# Patient Record
Sex: Female | Born: 1940
Health system: Southern US, Community
[De-identification: ages and names within clinical notes are randomized; demographics above are authoritative.]

## PROBLEM LIST (undated history)

## (undated) DIAGNOSIS — F419 Anxiety disorder, unspecified: Secondary | ICD-10-CM

## (undated) DIAGNOSIS — E0590 Thyrotoxicosis, unspecified without thyrotoxic crisis or storm: Secondary | ICD-10-CM

## (undated) DIAGNOSIS — Z5189 Encounter for other specified aftercare: Secondary | ICD-10-CM

## (undated) DIAGNOSIS — Z87442 Personal history of urinary calculi: Secondary | ICD-10-CM

## (undated) DIAGNOSIS — I672 Cerebral atherosclerosis: Secondary | ICD-10-CM

## (undated) DIAGNOSIS — Z8489 Family history of other specified conditions: Secondary | ICD-10-CM

## (undated) DIAGNOSIS — I209 Angina pectoris, unspecified: Secondary | ICD-10-CM

## (undated) DIAGNOSIS — M1990 Unspecified osteoarthritis, unspecified site: Secondary | ICD-10-CM

## (undated) DIAGNOSIS — R011 Cardiac murmur, unspecified: Secondary | ICD-10-CM

## (undated) DIAGNOSIS — I5030 Unspecified diastolic (congestive) heart failure: Secondary | ICD-10-CM

## (undated) DIAGNOSIS — E785 Hyperlipidemia, unspecified: Secondary | ICD-10-CM

## (undated) DIAGNOSIS — E119 Type 2 diabetes mellitus without complications: Secondary | ICD-10-CM

## (undated) DIAGNOSIS — I773 Arterial fibromuscular dysplasia: Secondary | ICD-10-CM

## (undated) DIAGNOSIS — I10 Essential (primary) hypertension: Secondary | ICD-10-CM

## (undated) DIAGNOSIS — J449 Chronic obstructive pulmonary disease, unspecified: Secondary | ICD-10-CM

## (undated) DIAGNOSIS — F0390 Unspecified dementia without behavioral disturbance: Secondary | ICD-10-CM

## (undated) DIAGNOSIS — C439 Malignant melanoma of skin, unspecified: Secondary | ICD-10-CM

## (undated) DIAGNOSIS — E871 Hypo-osmolality and hyponatremia: Secondary | ICD-10-CM

## (undated) DIAGNOSIS — I2510 Atherosclerotic heart disease of native coronary artery without angina pectoris: Secondary | ICD-10-CM

## (undated) HISTORY — PX: TOOTH EXTRACTION: SUR596

## (undated) HISTORY — DX: Cerebral atherosclerosis: I67.2

## (undated) HISTORY — PX: APPENDECTOMY: SHX54

## (undated) HISTORY — PX: CATARACT EXTRACTION, BILATERAL: SHX1313

## (undated) HISTORY — DX: Thyrotoxicosis, unspecified without thyrotoxic crisis or storm: E05.90

## (undated) HISTORY — DX: Arterial fibromuscular dysplasia: I77.3

## (undated) HISTORY — DX: Cardiac murmur, unspecified: R01.1

## (undated) HISTORY — DX: Hyperlipidemia, unspecified: E78.5

## (undated) HISTORY — DX: Malignant melanoma of skin, unspecified: C43.9

## (undated) HISTORY — DX: Encounter for other specified aftercare: Z51.89

## (undated) HISTORY — DX: Chronic obstructive pulmonary disease, unspecified: J44.9

## (undated) HISTORY — DX: Unspecified osteoarthritis, unspecified site: M19.90

---

## 1968-06-14 HISTORY — PX: ABDOMINAL HYSTERECTOMY: SHX81

## 2001-06-14 DIAGNOSIS — I2510 Atherosclerotic heart disease of native coronary artery without angina pectoris: Secondary | ICD-10-CM

## 2001-06-14 HISTORY — DX: Atherosclerotic heart disease of native coronary artery without angina pectoris: I25.10

## 2001-08-21 ENCOUNTER — Inpatient Hospital Stay (HOSPITAL_COMMUNITY): Admission: EM | Admit: 2001-08-21 | Discharge: 2001-08-29 | Payer: Self-pay | Admitting: Emergency Medicine

## 2001-08-21 ENCOUNTER — Encounter: Payer: Self-pay | Admitting: Emergency Medicine

## 2001-08-22 ENCOUNTER — Encounter: Payer: Self-pay | Admitting: Cardiovascular Disease

## 2001-08-22 HISTORY — PX: CARDIAC CATHETERIZATION: SHX172

## 2001-08-23 ENCOUNTER — Encounter: Payer: Self-pay | Admitting: Cardiovascular Disease

## 2001-08-24 ENCOUNTER — Encounter: Payer: Self-pay | Admitting: Thoracic Surgery (Cardiothoracic Vascular Surgery)

## 2001-08-25 ENCOUNTER — Encounter: Payer: Self-pay | Admitting: Thoracic Surgery (Cardiothoracic Vascular Surgery)

## 2001-08-27 ENCOUNTER — Encounter: Payer: Self-pay | Admitting: Thoracic Surgery (Cardiothoracic Vascular Surgery)

## 2001-09-20 ENCOUNTER — Encounter: Payer: Self-pay | Admitting: Thoracic Surgery (Cardiothoracic Vascular Surgery)

## 2001-09-20 ENCOUNTER — Encounter
Admission: RE | Admit: 2001-09-20 | Discharge: 2001-09-20 | Payer: Self-pay | Admitting: Thoracic Surgery (Cardiothoracic Vascular Surgery)

## 2008-06-14 DIAGNOSIS — C439 Malignant melanoma of skin, unspecified: Secondary | ICD-10-CM

## 2008-06-14 HISTORY — DX: Malignant melanoma of skin, unspecified: C43.9

## 2010-06-12 ENCOUNTER — Emergency Department (HOSPITAL_COMMUNITY)
Admission: EM | Admit: 2010-06-12 | Discharge: 2010-06-12 | Payer: Self-pay | Source: Home / Self Care | Admitting: Emergency Medicine

## 2010-07-20 HISTORY — PX: NM MYOCAR PERF WALL MOTION: HXRAD629

## 2010-08-11 ENCOUNTER — Other Ambulatory Visit: Payer: Self-pay | Admitting: Endocrinology

## 2010-08-11 DIAGNOSIS — E049 Nontoxic goiter, unspecified: Secondary | ICD-10-CM

## 2010-08-17 ENCOUNTER — Ambulatory Visit
Admission: RE | Admit: 2010-08-17 | Discharge: 2010-08-17 | Disposition: A | Discharge status: Home / Self Care | Payer: Self-pay | Source: Ambulatory Visit | Attending: Endocrinology | Admitting: Endocrinology

## 2010-08-17 DIAGNOSIS — E049 Nontoxic goiter, unspecified: Secondary | ICD-10-CM

## 2010-08-25 ENCOUNTER — Other Ambulatory Visit: Payer: Self-pay | Admitting: Endocrinology

## 2010-08-25 DIAGNOSIS — E042 Nontoxic multinodular goiter: Secondary | ICD-10-CM

## 2010-09-08 ENCOUNTER — Other Ambulatory Visit (HOSPITAL_COMMUNITY)
Admission: RE | Admit: 2010-09-08 | Discharge: 2010-09-08 | Disposition: A | Discharge status: Home / Self Care | Payer: Medicare Other | Source: Ambulatory Visit | Attending: Interventional Radiology | Admitting: Interventional Radiology

## 2010-09-08 ENCOUNTER — Other Ambulatory Visit: Payer: Self-pay | Admitting: Interventional Radiology

## 2010-09-08 ENCOUNTER — Ambulatory Visit
Admission: RE | Admit: 2010-09-08 | Discharge: 2010-09-08 | Disposition: A | Discharge status: Home / Self Care | Payer: Medicare Other | Source: Ambulatory Visit | Attending: Endocrinology | Admitting: Endocrinology

## 2010-09-08 DIAGNOSIS — E042 Nontoxic multinodular goiter: Secondary | ICD-10-CM

## 2010-10-30 NOTE — Consult Note (Signed)
[place]. [facility]  Patient:    Meredith Gutierrez, Meredith Gutierrez Visit Number: [id number] MRN: [id number]          Service Type: MED Location: [phone] Attending Physician:  Charlett Lango Dictated by:   Salvatore Decent. Dorris Fetch, M.D. Proc. Date: 08/22/01 Admit Date:  08/21/2001   CC:         [name], M.D.  Richard A. Alanda Amass, M.D.   Consultation Report  REASON FOR CONSULTATION:  Coronary aneurysm.  CHIEF COMPLAINT:  Chest pain.  HISTORY OF PRESENT ILLNESS:  The patient is a 70 year old female with a previous history of chest pain in 1994. She had cardiac catheterization done at that time and apparently was told it was normal. She subsequently had done well and had not had any additional chest pain until approximately 10 days ago. Starting then and for the next week, she had intermittent chest pain across the upper chest. It was a pressure as well as some sensation of sharp pain as well. It was associated with palpitations, shortness of breath, and diaphoresis. It also radiated to her neck and throat. There was no nausea or vomiting. She said that it could happen anytime, whether at rest or with exertion. She saw Dr. Susann Givens who did an EKG and other lab work, and scheduled her for a stress test on this coming Thursday. Her symptoms then resolved and had gone away until yesterday when she had a severe episode which lasted several hours. This was a more intense pain also associated with tachycardia and palpitations, diaphoresis, shortness of breath, and radiation to her neck. She tried nitroglycerin a couple of times without relief and came to the emergency room. She was given aspirin and oxygen on arrival and eventually her symptoms resolved. Her EKG showed sinus rhythm, poor R-wave progression across the precordium but no acute ST or T wave changes. Cardiac enzymes were unremarkable with a CK of 91, MB of 3.6, and a troponin of 0.04. The  patient today underwent cardiac catheterization which revealed no significant coronary stenoses, but she does have a saccular aneurysm on the LAD just beyond the takeoff of the first diagonal branch. This saccular aneurysm is approximately the diameter of the native LAD with a relatively thin neck. The origin of the diagonal appears to be just proximal to the aneurysm, but the aneurysm may actually involve the takeoff of the diagonal. She was also found to have fibromuscular dysplasia of the right renal artery. Her left ventricular function was normal.  PAST MEDICAL HISTORY:  Significant for hypertension, hypercholesterolemia. She has had an appendectomy, two D&Cs and three previous C-sections, as well as a hysterectomy.  MEDICATION ON ADMISSION: 1. Lotensin 5 mg p.o. q.d. 2. Baby aspirin one p.o. q.d.  ALLERGIES:  CODEINE, PREDNISONE, ERYTHROMYCIN, and PENICILLIN.  FAMILY HISTORY:  Father died at age 68 of a cardiac arrest after colon surgery. He also had a history of an MI. Her mother is 15 and has had two strokes. She has a brother with hypertension, and as sister also with hypertension.  SOCIAL HISTORY:  She is retired, widowed. She actually works part time in [employer]. She is a smoker for 20 years. She smokes one-half pack a day. She does not use alcohol.  REVIEW OF SYSTEMS:  She has not noted any fatigue or weakness. She has not had any weight loss. She does wear glasses. She has not noted any stroke or TIA-like symptoms. She has not had any wheezing, cough, or sputum production.  There is no claudication, no history of DVT or PE. She has had vein stripping from her right leg, recurrent varicose veins in the right leg. She has had some dizziness with her chest pain episodes. She does state that she bruises easily and is a "free bleeder," all other systems are negative.  PHYSICAL EXAMINATION:  GENERAL:  The patient is a 70 year old white female in no acute distress.  VITAL  SIGNS:  Blood pressure 130/75, pulse is 78 and regular, respirations are 20, oxygen saturation is 98% on 2 liters nasal cannula.  NEUROLOGICAL:  She is alert and oriented x3 and grossly intact.  HEENT:  Unremarkable. She is wearing glasses.  NECK:  Supple without thyromegaly, adenopathy, or bruits.  CARDIAC:  Regular rate and rhythm. Normal S1 and S2. No murmurs, rubs, or gallops.  LUNGS:  Clear to auscultation and percussion.  ABDOMEN:  Soft and nontender with positive bowel sounds.  EXTREMITIES:  She has scars at the left ankle from a saphenous vein cutdown. She had saphenous venectomy on the right side with multiple scars and some recurrent varicosities medially in the right leg. Her pulses are 2+ and equal bilaterally.  LABORATORY DATA:  On admission PT was 13.2, PTT 29, troponin 0.04. White count 10.8, hematocrit 39, platelets 306. Her sodium and potassium were 137 and 3.3, BUN and creatinine 13 and 0.7, glucose 149.  Chest x-ray showed some basilar atelectasis.  EKG: No acute T-wave changes.  IMPRESSION:  The patient is a 70 year old female. She presents with symptoms consistent with atypical unstable angina. She ruled out for a myocardial infarction. At cardiac catheterization she had no hemodynamically significant coronary disease; however, she did have a saccular aneurysm at the left anterior descending just beyond the takeoff of the first diagonal. This saccular aneurysm is very concerning. It is of a relatively large size, being approximately the same diameter as the left anterior descending itself. It does involve the takeoff of this diagonal branch. It is unclear really whether this had anything to do with her chest pain, or whether her chest pain and shortness of breath might be related to a tachyarrhythmia as she did have palpitations and her heart raced during these episodes. She also has fibromuscular dysplasia of the right renal artery. I think, given  the  appearance of this aneurysm, it appears to be a significant risk for rupture or thrombosis given its saccular nature, and my recommendation would be to ligate this aneurysm with bypass grafting of the left anterior descending and diagonal. This could potentially be done off pump, saving the more potential morbidity of cardiopulmonary bypass; however, that is not a certainty given the location. I do not believe there is any way to ligate the aneurysm without potentially compromising the diagonal, so I do think that would need to be grafted. I discussed with her and her family my opinion that this should be done. They understand the risks of the surgery including but not limited to death 1-2%, stroke, myocardial infarction, bleeding, possible need for blood transfusions, infections, deep vein thrombosis, pulmonary embolism, as well as other organ system dysfunction, including respiratory, renal, hepatic, or gastrointestinal. She understands that the symptomatology that she presented with cannot be directly tied to this aneurysm, but it is at least one potential source of difficulty, but the main reason for doing the surgery is to eliminate the future risks of thrombosis or rupture. She wished to think these matters over. If she decides to proceed with surgery, we could do  the procedure on Thursday, March 11th. I will meet with her again tomorrow to see if she has reached a decision. Dictated by:   [name] Fetch, M.D. Attending Physician:  Charlett Lango DD:  08/22/01 TD:  08/23/01 Job: [id number]

## 2010-10-30 NOTE — H&P (Signed)
[place]. [facility]  Patient:    Meredith Gutierrez, Meredith Gutierrez Visit Number: [id number] MRN: [id number]          Service Type: MED Location: [phone] Attending Physician:  Ruta Hinds Dictated by:   Moshe Salisbury, R.N.,A.C.N.P. Admit Date:  08/21/2001                           History and Physical  DATE OF BIRTH: [birth year]  CHIEF COMPLAINT: Chest pain.  HISTORY OF PRESENT ILLNESS: This is a 70 year old white female, a patient of Dr. Susann Givens, whose chest pain started at home about 1 p.m. and lasted to 4:30 p.m.  She took two nitroglycerin without relief.  The pain was across her upper chest, radiating to the middle of her throat.  She did not have nausea but she did have tachycardia, a cold sweat, shortness of breath, and the chest pain was severe.  It felt like a heaviness or a tightness.  Nothing made it better or worse.  Nothing seems to have brought it on.  The patient did have chest pain in 1994 and was hospitalized at [facility] at that time. She had a cardiac catheterization which she says she was told was negative. She cannot remember if this pain is similar to her present pain.  The pain began week before last as a tightness or heaviness across her upper chest, also with sweating and shortness of breath.  It lasted about five to 15 minutes and occurred multiple times each day.  She saw Dr. Susann Givens, who did an EKG and other laboratory work, and a stress test was set up with [facility] for a Cardiolite stress test on this coming Thursday. The chest pain lasted about seven days, and the patient was not having symptoms until yesterday.  On Sunday she had the same symptoms, which lasted about a minute, and occurred twice during the day.  These symptoms have not awakened the patient at night.  Upon arrival at the hospital she was given aspirin 325 mg and O2, and her heart rate was as high as 128.  She is  now pain-free.  PAST MEDICAL HISTORY:  1. Hypertension.  2. Hypercholesterolemia.  3. Obesity.  4. Smoking.  5. Cardiac catheterization in 1998 at [facility] the patient says     showed nothing; that is, no disease.  PAST SURGICAL HISTORY:  1. Cesarean section x3.  2. Two D&Cs.  3. Appendectomy.  4. Hysterectomy.  FRACTURES: None.  MEDICATION ALLERGIES:  1. CODEINE.  2. PREDNISONE.  3. MYCIN ANTIBIOTICS.  4. PENICILLIN.  MEDICATIONS:  1. Nitroglycerin 0.4 mg one sublingual every five minutes as needed for     chest pain.  2. Lotensin 5 mg one q.d.  3. Baby aspirin q.d.  4. Multivitamin q.d.  5. She says she takes potassium supplement 99 mg two q.d.  FAMILY HISTORY: The patients father died at 51 from cardiac arrest after colon surgery.  He had a history of myocardial infarction.  Her mother is 36 and has had cerebrovascular accidents x2, as well as numerous transient ischemic attacks.  She has one brother who has hypertension and one sister who has fibromyalgia and hypertension.  She has a daughter and a son, who are alive and well, and one grandchild.  SOCIAL HISTORY: The patient is a widow, and retired.  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  She does work part-time now for [employer], Personnel officer at [employer].  She is a 1/2 pack per day smoker. Does not use alcohol.  Does use caffeine.  Does not abuse drugs.  She is right-handed.  REVIEW OF SYSTEMS:  GENERAL: The patient denies fevers, chills.  She states that her weight is stable, and she sleeps only fair.  She denies edema.  EYES: The patient denies blurring, glaucoma, or cataracts.  She does wear glasses. EARS/NOSE/MOUTH/THROAT: The patient has a full set of upper and lower dentures.  She denies deafness or tinnitus.  She does have some rhinorrhea. She denies sneezing, and food has not lost its taste for her.  RESPIRATORY: The patient states that she has shortness of  breath on exertion.  She denies coughing, sputum production, wheezing, but she does smoke 1/2 pack of cigarettes per day.  CARDIAC: The patient does have chest pain, as noted above.  She also has experienced tachycardia with her chest pain.  She has some orthopnea.  VASCULAR: The patient denies claudication.  GASTROINTESTINAL: The patient denies nausea and indigestion, and she has regular bowel movements.  GENITOURINARY: The patient denies dysuria, pyuria, hematuria, or anuria.  She has nocturia x1.  She is post hysterectomy.  ENDOCRINE: The patient denies diabetes mellitus or hypothyroidism.  SKIN: The patient denies rashes or other skin problems.  MUSCULOSKELETAL: The patient states that she does have right knee pain but she denies myalgias or weakness.  She says that she has a steady gait.  PSYCHIATRIC: The patient denies depression and hallucinations.  Her appetite is normal, but she does have some anxiety. NEUROLOGIC: The patient has dizziness with her chest pain.  She denies faintness, seizures, or tremors.  ADENOPATHY: The patient denies adenopathy of the neck, axilla, or groin.  HEMATOLOGIC: The patient states that she both bruises and bleeds easily.  ALLERGIC: The patient is allergic to PENICILLIN, PREDNISONE, CODEINE, and MYCIN ANTIBIOTICS.  She denies any other allergies.  PHYSICAL EXAMINATION:  VITAL SIGNS: The patients blood pressure is 130/74, her pulse is 79, her respirations are 18.  She is on O2.  Her height is 5 feet 2 inches.  Her weight is unknown at this time.  Her temperature is 97.3 degrees Fahrenheit.  GENERAL: The patient is a well-developed, well-nourished white female, in no acute distress.  PSYCHIATRIC: The patient is pleasant, cooperative, and responds appropriately.  HEENT: The patients wears glasses.  Her pupils are equal, round, and reactive to light, and 2 mm in diameter.  Normocephalic, atraumatic.  Her mouth is moist.  Her oropharynx is benign.  She  is wearing a full set of upper and lower dentures.  NECK: Supple, with a midline trachea.  She was without jugular venous  distention, bruits, thyromegaly, lymphadenopathy, or hepatojugular reflux.  CHEST: The patients chest is clear to auscultation and percussion.  She is eupneic.  CARDIAC: The patient has a regular rate and rhythm.  S1 and S2 are clearly heard.  She is without murmurs, gallops, rubs, or clicks.  ABDOMEN: The patient has positive bowel sounds.  Her abdomen is nontender and soft.  She is not distended, but she is obese.  BREAST: The patients breast are of normal contour, without discharge or tenderness.  EXTREMITIES: The patient moves all extremities x4.  Her strength is 5/5 in her upper and lower extremities.  She is without ankle edema.  SKIN: The patients skin is warm and dry without jaundice, cyanosis, pallor, or rashes.  She has  brisk capillary refill.  NEUROLOGIC: The patient is conscious, alert and oriented to person, place, time, and situation.  Cranial nerves II-XII are grossly intact.  LABORATORY DATA: The patients PTT is 29, her PT is 13.2, her INR is 1.0.  Her troponin I is 0.04.  Her CK is 91, her MB is 3.6.  The index is not calculated.  Her WBC is high at 10.8, her hemoglobin is 13.9, her hematocrit is 39.6, her platelets are 306,000.  Her sodium is 137, her potassium is mildly low at 3.3, her chloride is 104, her bicarbonate is 21, her BUN is 13, her creatinine is 0.7, her blood sugar is 149.  Her chest x-ray showed atelectasis in her right base.  Her EKG shows normal sinus rhythm with ST-T wave abnormalities.  IMPRESSION:  1. Chest pain.  2. Shortness of breath.  3. Weakness.  4. Hypertension.  5. Hypercholesterolemia.  6. Active smoking.  7. Obesity.  PLAN:  1. Admit to telemetry.  2. Serial enzymes and EKGs.  3. O2 by nasal cannula.  4. Anticoagulation.  5. Nitroglycerin drip.  6. Cardiac catheterization, possible angioplasty in  the a.m.  She has been     added to the add-on board.Dictated by:   Moshe Salisbury, R.N.,A.C.N.P.  Attending Physician:  Ruta Hinds DD:  08/21/01 TD:  08/22/01 Job: [id number]

## 2010-10-30 NOTE — Discharge Summary (Signed)
[place]. [facility]  Patient:    ZIANA, HEYLIGER Visit Number: [id number] MRN: [id number]          Service Type: MED Location: 2000 2040 01 Attending Physician:  Charlett Lango Dictated by:   Adair Patter, P.A. Admit Date:  08/21/2001 Discharge Date: 08/29/2001   CC:         [facility]   Discharge Summary  ADMITTING DIAGNOSIS:  Chest pain.  SECONDARY DIAGNOSIS:  Coronary aneurysm.  DISCHARGE DIAGNOSIS:  Coronary aneurysm.  HOSPITAL COURSE: Ms. Hosie was admitted to [facility] on August 22, 2001, secondary to chest pain.  Because of this chest pain, she underwent cardiac work-up including cardiac catheterization.  Results of the catheterization revealed the patient had a coronary aneurysm of her left anterior descending artery.  Because of this, Dr. Dorris Fetch was consulted and recommended surgical intervention.  On August 24, 2001, the patient underwent a coronary artery bypass graft x2 off pump of the left internal mammary artery and anastomosis to the left anterior descending artery and a saphenous vein graft to the diagonal artery.  At this time, she also underwent ligation of the left anterior descending artery aneurysm.  No complications were noted during the procedure.  Postoperatively, the patient had a relatively uneventful hospital course.  She had some nausea and vomiting which was treated with adjustment of pain medications.  Hemoglobin and hematocrit remained stable at 9.6 and 27.  The BUN and creatinine were 7 and 0.6, respectively.  The patient did have intermittent shortness of breath.  This was treated with nebulizer treatments and oxygen as needed.  She was subsequently deemed stable for discharge on August 29, 2001.  DISCHARGE MEDICATIONS: 1. Ultram 50 mg one to two tablets every four to six hours as needed for pain. 2. Aspirin 325 mg one daily. 3. Toprol XL 25 mg one daily. 4.  Wellbutrin 150 mg one daily. 5. Zocor 40 mg one daily. 6. Combivent inhaler three puffs four times daily.  ACTIVITY:  The patient is told to avoid driving, strenuous activity or lifting heavy objects.  The patient is told to walk daily and continue breathing exercises.  DISCHARGE DIET:  Without salt.  WOUND CARE:  The patient is told she can shower and clean her incisions with soap and water.  FOLLOW-UP:  The patient is told to call her cardiologist, Dr. Alanda Amass, for an appointment in two weeks.  She is told to see Dr. Dorris Fetch on Wednesday, September 20, 2001, at 11:30 at the [facility] office.  She was told to go to the [facility] one hour before this appointment for a chest x-ray. Dictated by:   Adair Patter, P.A. Attending Physician:  Charlett Lango DD:  08/29/01 TD:  08/29/01 Job: [id number]

## 2010-10-30 NOTE — Cardiovascular Report (Signed)
[place]. [facility]  Patient:    Meredith Gutierrez, Meredith Gutierrez Visit Number: [id number] MRN: [id number]          Service Type: MED Location: [phone] Attending Physician:  Ruta Hinds Dictated by:   Pearletha Furl Alanda Amass, M.D. Proc. Date: 08/22/01 Admit Date:  08/21/2001   CC:         Meredith Gutierrez, M.D.  Meredith Gutierrez, M.D.  Meredith Gutierrez, M.D.   Cardiac Catheterization  PROCEDURES: 1. Retrograde central aortic catheterization. 2. Selective coronary angiography via Judkins technique. 3. Left ventricular angiogram, RAO/LAO projection. 4. Aortic root angiogram, LAO projection. 5. Subselective LIMA/RIMA. 6. Abdominal aortic angiogram, midstream PA projection. 7. Bilateral selective renal angiography.  PROCEDURAL NOTE:  The above procedure was done with #6 [place] diagnostic Cordis preformed coronary and pigtail catheters using Omnipaque dye.  The procedure was done through a #6 [place] short Daig sidearm sheath that was placed in the CRFA with a single anterior puncture using the modified Seldinger technique under 1% Xylocaine anesthesia and 2 mg of Nubain for sedation.  Coronary angiography was done in multiple views followed by LV angiography in the RAO and LAO projection at 25 cc/14 cc per second and 20 cc/12 cc per second.  Pullback pressures of CA showed no gradient across the aortic valve.  Subselective LIMA/RIMA, brachiocephalic, and left subclavian were done with a right coronary catheter with hand injection. Abdominal aortic angiogram was done in the midstream PA projection at 25 cc/ 20 cc per second.  The catheter was inadvertently below the level of the renal arteries so this was repeated at a level above the renal arteries because of disease in these.  Selective renal angiography was done with a right coronary catheter by hand injection.  Re-injections of the left coronary in angled caudal LAO projection to visualize the ostia  were then repeated by hand injection.  The catheter was removed.  Sidearm sheath was flushed.  The patient had heparin held on call to the laboratory.  ACT was 130.  She was transferred to the holding area for sheath removal with pressure hemostasis. She tolerated the procedure well.  PRESSURES: 1. LV: 150/0; LVEDP 18 mmHg. 2. CA: 150/85 mmHg. 3. There is no gradient across the aortic valve on catheter pullback.  Fluoroscopy revealed minimal calcification of the aortic arch opposite of the arch into the great vessels.  There was no significant coronary or intracardiac calcification seen.  LEFT VENTRICULOGRAM:  LV angiogram revealed a vigorous left ventricle that was hyperdynamic.  In the post-PVC beat there was some near mid systolic cavity obliteration.  EF was greater than 60%.  There was no mitral regurgitation and angiographic LVH was present.  CORONARY ARTERIES: 1. The main left coronary artery was a large vessel.  It was normal length and    did not appear aneurysmal.  No significant obstruction.  2. The proximal LAD was a very large vessel that appeared abnormally enlarged    although not aneurysmal up to the junction of the proximal third beyond a    large first diagonal branch.  There was an eccentric single saccular    aneurysm just beyond the first diagonal branch not interfering with the    origin of the first diagonal.  There was normal flow to the LAD.  The LAD    in the mid portion had irregularities with 30-40% narrowing and coursed to    the apex and undersurface where it bifurcated.  There was TIMI-3  flow    throughout the LAD.  3. The first diagonal was moderately large, bifurcated, and had no significant    stenosis.  The second diagonal from the junction of the proximal mid third    of the LAD had no significant stenosis.  There were two septal perforators    arising proximal and distal to the first diagonal.  4. The circumflex ostia had eccentric 30-40%  narrowing with good flow.  There    was narrowing of a very small OM-1 ostium.  The remainder of the circumflex    appeared normal with a normal bifurcating OM-2, a nondominant moderate    sized bifurcating circumflex proper, and a moderate sized CABG and left    atrial branch.  5. The right coronary artery was a dominant vessel with irregularities and 20%    narrowing of the proximal third and 20% narrowing of the distal portion    before the bifurcation.  There were 3 PDA branches and a small PLA branch.    Normal flow throughout the right coronary artery.  6. The left subclavian appeared normal.  The left vertebral had antegrade flow    with minor tortuosity, and the LIMA appeared normal.  7. The right brachiocephalic appeared normal as did the proximal RCCA.  The    mid right subclavian at the origin of the vertebral and the RIMA was mildly    dilated compared to the remainder of the vessel.  There was good flow.  AORTIC ARCH:  Aortic arch injection in the LAO projection showed a dilated aortic root that did not appear aneurysmal.  There was normal origin of the great vessels and no dissection.  There was some calcification on the opposite wall from the origin of the left subclavian at that level.  ABDOMINAL AORTIC ANGIOGRAM:  Abdominal aortic angiogram in the midstream PA projection revealed a normal celiac and proximal SMA access.  The left renal artery appeared normal.  The right renal artery had significant FMD "beading-type" lesions with what appeared to be angiographically high-grade stenosis in the mid-distal third junction of the main right renal artery before the bifurcation to the anterior and posterior branches.  There was no FMD in the left renal artery.  The infrarenal abdominal aorta had mild atherosclerotic changes with no aneurysm formation or stenosis with normal proximal iliacs.   ASSESSMENT:  This 70 year old widow, white mother of 1, is a smoker  with hyperlipidemia.  She had normal coronary arteries in 1994 at [facility].  She is referred by Dr. Susann Givens for 2 weeks of very typical ischemic-sounding chest pain with substernal pressure with exertion and rest and radiation to the left arm.  She was to have an outpatient Cardiolite this week, however was admitted last night because of prolonged chest pain. Myocardial infarction was ruled out by serial enzymes and EKGs.  There is a history of hypertension on Lotensin and a history of GERD symptoms with symptoms of dysphagia but no GI bleeding.  Coronary angiography shows a large saccular LAD aneurysm just beyond the first diagonal branch.  This is well circumscribed and does not appear to be causing any flow restriction to the diagonal or LAD.  It is an approximate size of the LAD in this area and I would estimate it at about 4.5- to 5-mm in diameter. It is about 2 to 3 times the size of the diagonal which arises just above the neck of the aneurysm.  There was mild disease of the  ostium of the circumflex and no significant coronary stenosis.  The etiology of her chest pain is not clear.  It is similar to what she had in 1994 so it may be noncoronary.  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  She does have FMD (fibromuscular dysplasia) and systemic hypertension of the right renal artery.  This appears high-grade angiographically and she is a candidate for PTRA of this in an elective setting.  There is vigorous LV function and mild arch dilatation.  I will check serologies on this patient.  It may be useful to get a CT and/or MRI of her ascending aorta.  Her LAD appears abnormally large although not aneurysmal compared to the remainder of the vessel and there are no other great vessel aneurysms.  ______ systemic disease.  From an interventional standpoint this aneurysm could potentially be treated with  coiling and possibly stenting to exclude this and/or coverage stent but you would probably have to sacrifice the first diagonal.  Vascular surgical consultation is pending with further discussions.  Intensive GI therapy now and she may require GI consultation as well.  CATHETERIZATION DIAGNOSES: 1. Exertional chest pain compatible with ischemia.  No significant coronary    stenosis on angiography. 2. 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 3. Fibromuscular dysplasia, right renal artery, with high-grade stenosis    associated with systemic hypertension, left ventricular hypertrophy. 4. Chronic obstructive pulmonary disease, cigarette abuse. 5. Hyperlipidemia. Dictated by:   Pearletha Furl Alanda Amass, M.D. Attending Physician:  Ruta Hinds DD:  08/22/01 TD:  08/22/01 Job: [id number]

## 2010-10-30 NOTE — Op Note (Signed)
[place]. [facility]  Patient:    Meredith Gutierrez, Meredith Gutierrez Visit Number: [id number] MRN: [id number]          Service Type: MED Location: [phone] Attending Physician:  Meredith Gutierrez Dictated by:   Meredith Gutierrez, M.D. Proc. Date: 08/24/01 Admit Date:  08/21/2001   CC:         Meredith Gutierrez, M.D.   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  PROCEDURE:  Median sternotomy, off pump coronary artery bypass grafting x2 (left internal mammary artery to left anterior descending artery, saphenous vein graft to first diagonal), and ligation of left anterior descending artery aneurysm.  SURGEON:  Meredith Gutierrez, M.D.  ASSISTANT:  Areta Haber, P.A.  ANESTHESIA:  General.  FINDINGS:  Approximately 2 cm diameter saccular aneurysm of the left anterior descending artery arising at the take off of a large first diagonal branch. The first diagonal was tented over the aneurysm, and appeared to be compressed.  Good quality targets and good quality conduits.  Saphenous vein at the ankle on the left side was unusable.  INDICATIONS FOR PROCEDURE:  Meredith Gutierrez is a 70 year old female who presented with complaints consistent with angina pectoris.  She was evaluated by Dr. Susa Gutierrez, and underwent cardiac catheterization which revealed no significant coronary stenoses, but there was a large saccular aneurysm of the left anterior descending artery with possible impingement on the first diagonal.  Aneurysm arose just distal to the take off of the first diagonal. The patient was referred for consideration of treatment of the aneurysm and possible bypass grafting.  Discussed in detail with the patient the nature of her problem, the danger of growth, thrombosis, or rupture of the saccular aneurysm  of her left anterior descending artery, the uncertainty as to the etiology of her chest pain and the actual risk of fatal complication from the left anterior descending artery aneurysm.  Made the recommendation that we proceed with bypass grafting and ligation of the aneurysm.  It was felt that bypassing the left anterior descending artery was necessary because of the risk of narrowing the left anterior descending artery at the site of the aneurysm with closure.  The patient understood all issues involved, she accepted the risks of surgery, and agreed to proceed.  DESCRIPTION OF PROCEDURE:  Meredith Gutierrez was brought to the preoperative holding area on 08/24/01.  Lines were placed to monitor arterial central venous, and pulmonary arterial pressures.  EKG leads were placed for continuous telemetry.  The patient was taken to the operating room, anesthetized and intubated.  A Foley catheter was placed, intravenous antibiotics were administered.  The chest, abdomen, and legs were prepped and draped in the usual fashion.  A median sternotomy was performed.  The left internal mammary artery was harvested in a standard fashion.  It was a good quality conduit. Simultaneously, an incision was made in the medial aspect of the left ankle. The greater saphenous vein was identified and dissected over a short area, however, it became sclerotic and too small to use for grafting, therefore, a segment of vein was harvested from the thigh which was of good quality, although it was beginning to narrow distally, but was sufficient for use as a graft.  After harvesting the graft, the patient was given 7000 units of heparin intravenously.  ACTs were monitored, and additional heparin was given as needed  to maintain an ACT of greater than 300.  The pericardium was opened.  The ascending aorta was inspected and palpated. There was no palpable atherosclerotic disease.  Deep pericardial stay sutures were placed and  covered with Rumel tourniquets to prevent epicardial laceration.  Traction was placed on the deep pericardial sutures, and the apex of the heart was brought forward out of the chest.  The patient was placed in steep Trendelenburg position, and rotated to the right side, and tolerated changes in cardiac position well without any evidence of sustained hypotension or ST changes throughout the procedure.  The heart was inspected.  It was suitable for off pump grafting, as well as treatment of the aneurysm.  The aneurysm could be seen beneath the epicardial fat at the take off of the first diagonal.  The conduits were cut to length using a Guidant stabilizer.  The site was selected for grafting on the first diagonal.  This vessel was a 2 mm good quality target.  It was narrowed from traction over the aneurysm proximally.  The heart was stabilized, and stabilization was excellent.  An arteriotomy was made in the diagonal.  Lengthened proximally and distally.  A 2 mm shunt was placed with good hemostasis.  The saphenous vein graft was anastomosed end-to-side to the first diagonal with a running 7-0 Prolene suture.  Before placing the final suture, the shunt was removed.  There was good hemostasis at the anastomosis.  Next, the stabilizer was moved to the mid left anterior descending artery. This was also a 2 mm good quality target.  The mammary was a 3 mm good quality conduit.  Again, there was good stabilization.  An arteriotomy was made in the left anterior descending artery, lengthened proximally and distally.  A 2 mm shunt was placed with good hemostasis.  The mammary to left anterior descending artery anastomosis was performed end-to-side with a running 8-0 Prolene suture.  Again, before placing a final suture, the shunt was removed, and again there was good hemostasis of the anastomosis.  The anastomosis was de-aired prior to releasing the bulldog from the mammary artery.  The  mammary pedicle was tacked to the epicardial surface of the heart with 6-0 Prolene sutures.   Next, a partial occlusion clamp was placed on the ascending aorta.  This was done slowly, sequentially tightening the clamp with each diastole.  A 4.0 mm punch aortotomy was performed, and the proximal vein graft anastomosis was performed with this punch aortotomy with a running 6-0 Prolene suture.  With the patient still in steep Trendelenburg position, the partial clamp was removed before tying the suture to allow for de-airing.  Air was aspirated from the vein graft.  The bulldog clamp was removed allowing flow through that graft as well.  The stabilizer then was placed at the bifurcation of the left anterior descending artery and the first diagonal.  There was a 2 cm saccular aneurysm. The epicardial fat was dissected off the aneurysm anterior laterally.  It was not dissected posterior for fear of manipulation of the aneurysm.  The aneurysm did extend into the myocardium in that region.  Adequate exposure was obtained to allow suture placement.  A 4-0 Prolene horizontal mattress suture with Teflon felt pledgets was placed through the neck of the aneurysm.  A second 4-0 suture was placed as well.  Before tying the second suture, the aneurysm was incised.  There was bleeding.  When the second suture was tied, the bleeding completely stopped, indicating adequate  closure of the neck of the aneurysm.  The patient remained hemodynamically stable without significant ST changes throughout the entire procedure.  The deep pericardial sutures were removed. The heart was allowed to rest back in its normal location.  All proximal and distal anastomoses were inspected for hemostasis.  Epicardial pacing wires were placed on the right ventricle and right atrium.  Hemostasis was achieved. The chest was irrigated with 1 L of warm normal saline containing 1 g of vancomycin.  The heparin was not reversed.  A  left pleural and two mediastinal chest tubes were placed through separate subcostal incisions.  The pericardium was reapproximated with interrupted 3-0 silk sutures that came together easily without tension.  A left pleural and two mediastinal chest tubes were placed through separate subcostal incisions.  The sternum was closed with heavy gauge interrupted stainless steel wires.  The pectoralis fascia was closed with a running #1 Vicryl suture.  The subcutaneous tissue was closed with a running 2-0 Vicryl suture.  The skin was closed with a 3-0 Vicryl subcuticular suture. All sponge, needle, and instrument counts were correct at the end of the procedure.  The patient was taken from the operating room to the surgical intensive care unit intubated in critical, but stable condition. Dictated by:   Meredith Decent Dorris Gutierrez, M.D. Attending Physician:  Meredith Gutierrez DD:  08/24/01 TD:  08/26/01 Job: [id number]

## 2011-08-05 ENCOUNTER — Inpatient Hospital Stay (HOSPITAL_COMMUNITY)
Admission: EM | Admit: 2011-08-05 | Discharge: 2011-08-10 | DRG: 287 | Disposition: A | Discharge status: Home / Self Care | Payer: Medicare Other | Source: Ambulatory Visit | Attending: Internal Medicine | Admitting: Internal Medicine

## 2011-08-05 ENCOUNTER — Encounter (HOSPITAL_COMMUNITY): Payer: Self-pay | Admitting: *Deleted

## 2011-08-05 ENCOUNTER — Emergency Department (HOSPITAL_COMMUNITY): Payer: Medicare Other

## 2011-08-05 ENCOUNTER — Other Ambulatory Visit: Payer: Self-pay

## 2011-08-05 DIAGNOSIS — Z951 Presence of aortocoronary bypass graft: Secondary | ICD-10-CM | POA: Diagnosis present

## 2011-08-05 DIAGNOSIS — Z87891 Personal history of nicotine dependence: Secondary | ICD-10-CM | POA: Diagnosis present

## 2011-08-05 DIAGNOSIS — R001 Bradycardia, unspecified: Secondary | ICD-10-CM | POA: Diagnosis present

## 2011-08-05 DIAGNOSIS — I498 Other specified cardiac arrhythmias: Secondary | ICD-10-CM | POA: Diagnosis present

## 2011-08-05 DIAGNOSIS — F17200 Nicotine dependence, unspecified, uncomplicated: Secondary | ICD-10-CM | POA: Diagnosis present

## 2011-08-05 DIAGNOSIS — I509 Heart failure, unspecified: Secondary | ICD-10-CM | POA: Diagnosis present

## 2011-08-05 DIAGNOSIS — I10 Essential (primary) hypertension: Secondary | ICD-10-CM | POA: Diagnosis present

## 2011-08-05 DIAGNOSIS — I5030 Unspecified diastolic (congestive) heart failure: Principal | ICD-10-CM | POA: Diagnosis present

## 2011-08-05 DIAGNOSIS — E1169 Type 2 diabetes mellitus with other specified complication: Secondary | ICD-10-CM | POA: Diagnosis present

## 2011-08-05 DIAGNOSIS — E785 Hyperlipidemia, unspecified: Secondary | ICD-10-CM | POA: Diagnosis present

## 2011-08-05 DIAGNOSIS — I2510 Atherosclerotic heart disease of native coronary artery without angina pectoris: Secondary | ICD-10-CM | POA: Diagnosis present

## 2011-08-05 DIAGNOSIS — R21 Rash and other nonspecific skin eruption: Secondary | ICD-10-CM | POA: Diagnosis present

## 2011-08-05 DIAGNOSIS — I5032 Chronic diastolic (congestive) heart failure: Secondary | ICD-10-CM

## 2011-08-05 HISTORY — DX: Essential (primary) hypertension: I10

## 2011-08-05 HISTORY — DX: Unspecified diastolic (congestive) heart failure: I50.30

## 2011-08-05 HISTORY — DX: Anxiety disorder, unspecified: F41.9

## 2011-08-05 HISTORY — DX: Atherosclerotic heart disease of native coronary artery without angina pectoris: I25.10

## 2011-08-05 HISTORY — DX: Angina pectoris, unspecified: I20.9

## 2011-08-05 LAB — CBC
MCH: 32.1 pg (ref 26.0–34.0)
MCHC: 35.7 g/dL (ref 30.0–36.0)
MCV: 90 fL (ref 78.0–100.0)
RDW: 12.7 % (ref 11.5–15.5)

## 2011-08-05 LAB — BASIC METABOLIC PANEL
Calcium: 9.8 mg/dL (ref 8.4–10.5)
Creatinine, Ser: 0.43 mg/dL — ABNORMAL LOW (ref 0.50–1.10)
Glucose, Bld: 123 mg/dL — ABNORMAL HIGH (ref 70–99)
Potassium: 3.4 mEq/L — ABNORMAL LOW (ref 3.5–5.1)
Sodium: 141 mEq/L (ref 135–145)

## 2011-08-05 LAB — CARDIAC PANEL(CRET KIN+CKTOT+MB+TROPI)
Relative Index: INVALID (ref 0.0–2.5)
Total CK: 51 U/L (ref 7–177)
Troponin I: <0.3 ng/mL (ref <0.30)

## 2011-08-05 LAB — TROPONIN I: Troponin I: <0.3 ng/mL (ref <0.30)

## 2011-08-05 LAB — PRO B NATRIURETIC PEPTIDE: Pro B Natriuretic peptide (BNP): 967.1 pg/mL — ABNORMAL HIGH (ref 0–125)

## 2011-08-05 MED ORDER — NITROGLYCERIN 2 % TD OINT
1.0000 [in_us] | TOPICAL_OINTMENT | Freq: Four times a day (QID) | TRANSDERMAL | Status: DC
  Administered 2011-08-05: 1 [in_us] via TOPICAL
  Filled 2011-08-05: qty 1

## 2011-08-05 MED ORDER — NITROGLYCERIN 0.4 MG SL SUBL
0.4000 mg | SUBLINGUAL_TABLET | SUBLINGUAL | Status: DC | PRN
  Administered 2011-08-05: 0.4 mg via SUBLINGUAL
  Filled 2011-08-05: qty 25

## 2011-08-05 MED ORDER — ASPIRIN 325 MG PO TABS
325.0000 mg | ORAL_TABLET | ORAL | Status: AC
  Administered 2011-08-05: 325 mg via ORAL
  Filled 2011-08-05: qty 1

## 2011-08-05 NOTE — ED Notes (Signed)
Pt has had left chest pain and sob for 2-3 days.  This has been associated with sob and hypertension at home.

## 2011-08-05 NOTE — H&P (Addendum)
Meredith Gutierrez is an 71 y.o. female.   PCP - Dr.Kevin Little. Chief Complaint: Chest pain. HPI: 71 year-old female with history of hypertension CAD status post CABG in 2003 presented to the ER because of ongoing chest pain. Patient has been experiencing chest pain last 3 days. The chest pain is in the anterior chest wall feels like a pressure and last for a few seconds but is associated with exertion and shortness of breath. Denies any associated nausea vomiting diaphoresis or dizziness. Today when patient checked her blood pressure at home it was found to be very high and at the moment she called EMS. Cardiac enzymes and EKG does not show anything acute at this time. Chest x-ray shows mild congestion. Patient will be admitted for further management. Patient denies any abdominal pain fever chills cough or phlegm or any focal deficits.  Past Medical History  Diagnosis Date  . Hypertension   . Coronary artery disease     Past Surgical History  Procedure Date  . Coronary artery bypass graft   . Abdominal hysterectomy   . Appendectomy   . Cesarean section     Family History  Problem Relation Age of Onset  . Stroke Mother   . Coronary artery disease Father   . Breast cancer Maternal Aunt    Social History:  reports that she has been smoking Cigarettes.  She has been smoking about .5 packs per day. She does not have any smokeless tobacco history on file. She reports that she does not drink alcohol. Her drug history not on file.  Allergies:  Allergies  Allergen Reactions  . Codeine     Shortness of breath  . Penicillins     Shortness of breath  . Prednisone     Shortness of breath    Medications Prior to Admission  Medication Dose Route Frequency Provider Last Rate Last Dose  . aspirin tablet 325 mg  325 mg Oral STAT Glynn Octave, MD   325 mg at 08/05/11 2057  . nitroGLYCERIN (NITROGLYN) 2 % ointment 1 inch  1 inch Topical Q6H Glynn Octave, MD      . nitroGLYCERIN  (NITROSTAT) SL tablet 0.4 mg  0.4 mg Sublingual Q5 min PRN Glynn Octave, MD   0.4 mg at 08/05/11 2058   No current outpatient prescriptions on file as of 08/05/2011.    Results for orders placed during the hospital encounter of 08/05/11 (from the past 48 hour(s))  CBC     Status: Abnormal   Collection Time   08/05/11  6:58 PM      Component Value Range Comment   WBC 13.5 (*) 4.0 - 10.5 (K/uL)    RBC 4.11  3.87 - 5.11 (MIL/uL)    Hemoglobin 13.2  12.0 - 15.0 (g/dL)    HCT 96.0  45.4 - 09.8 (%)    MCV 90.0  78.0 - 100.0 (fL)    MCH 32.1  26.0 - 34.0 (pg)    MCHC 35.7  30.0 - 36.0 (g/dL)    RDW 11.9  14.7 - 82.9 (%)    Platelets 283  150 - 400 (K/uL)   BASIC METABOLIC PANEL     Status: Abnormal   Collection Time   08/05/11  6:58 PM      Component Value Range Comment   Sodium 141  135 - 145 (mEq/L)    Potassium 3.4 (*) 3.5 - 5.1 (mEq/L)    Chloride 104  96 - 112 (mEq/L)    CO2  26  19 - 32 (mEq/L)    Glucose, Bld 123 (*) 70 - 99 (mg/dL)    BUN 18  6 - 23 (mg/dL)    Creatinine, Ser 2.13 (*) 0.50 - 1.10 (mg/dL)    Calcium 9.8  8.4 - 10.5 (mg/dL)    GFR calc non Af Amer >90  >90 (mL/min)    GFR calc Af Amer >90  >90 (mL/min)   PRO B NATRIURETIC PEPTIDE     Status: Abnormal   Collection Time   08/05/11  6:58 PM      Component Value Range Comment   Pro B Natriuretic peptide (BNP) 967.1 (*) 0 - 125 (pg/mL)   TROPONIN I     Status: Normal   Collection Time   08/05/11  6:58 PM      Component Value Range Comment   Troponin I <0.30  <0.30 (ng/mL)   CARDIAC PANEL(CRET KIN+CKTOT+MB+TROPI)     Status: Normal   Collection Time   08/05/11  9:35 PM      Component Value Range Comment   Total CK 51  7 - 177 (U/L)    CK, MB 2.8  0.3 - 4.0 (ng/mL)    Troponin I <0.30  <0.30 (ng/mL)    Relative Index RELATIVE INDEX IS INVALID  0.0 - 2.5     Dg Chest 2 View  08/05/2011  *RADIOLOGY REPORT*  Clinical Data: Chest tightness.  Shortness of breath.  Smoker.  CHEST - 2 VIEW  Comparison: Report dated  09/20/2001.  Findings: Borderline enlarged cardiac silhouette.  Post CABG changes.  Clear lungs.  The lungs are mildly hyperexpanded with mild prominence of the pulmonary vasculature and interstitial markings.  Thoracic spine degenerative changes.  IMPRESSION:  1.  Borderline cardiomegaly and mild pulmonary vascular congestion. 2.  Mild changes of COPD.  Original Report Authenticated By: Darrol Angel, M.D.    Review of Systems  Constitutional: Negative.   HENT: Negative.   Eyes: Negative.   Respiratory: Positive for shortness of breath.   Cardiovascular: Positive for chest pain.  Gastrointestinal: Negative.   Genitourinary: Negative.   Musculoskeletal: Negative.   Skin: Negative.   Neurological: Negative.   Endo/Heme/Allergies: Negative.   Psychiatric/Behavioral: Negative.     Blood pressure 147/69, pulse 46, temperature 98.2 F (36.8 C), temperature source Oral, resp. rate 13, SpO2 99.00%. Physical Exam  Constitutional: She is oriented to person, place, and time. She appears well-developed and well-nourished. No distress.  HENT:  Head: Normocephalic and atraumatic.  Right Ear: External ear normal.  Left Ear: External ear normal.  Nose: Nose normal.  Mouth/Throat: Oropharynx is clear and moist. No oropharyngeal exudate.  Eyes: Conjunctivae are normal. Pupils are equal, round, and reactive to light. Right eye exhibits no discharge. Left eye exhibits no discharge. No scleral icterus.  Neck: Normal range of motion. Neck supple.  Cardiovascular:       Sinus bradycardia rate is around 50 per minute.  Respiratory: Effort normal and breath sounds normal. No respiratory distress. She has no wheezes. She has no rales.  GI: Soft. Bowel sounds are normal. She exhibits no distension. There is no tenderness. There is no rebound.  Musculoskeletal: Normal range of motion. She exhibits no edema and no tenderness.  Neurological: She is alert and oriented to person, place, and time. She has normal  reflexes.       Moves upper and lower extremities 5/5.   Skin: She is not diaphoretic.       Small rash in  the left hand from a scratch obtained from a dog's nail.     Assessment/Plan #1. Chest pain with history of CAD status post CABG - at this time for chest pain is concerning for ACS. Patient will be continued on aspirin and on nitroglycerin patch. Cycle cardiac markers. Check 2-D echo. Consult [facility] for further workup. Patient does have a mildly elevated pro BNP and chest x-ray showing mild congestion. We'll closely observe. Patient is chest pain-free and not short of breath now. #2. Hypertension - continue present medications. #3. Ongoing tobacco abuse - tobacco cessation counseling ordered. #4. Sinus bradycardia - will observe.  CODE STATUS - full code.  Pranathi Winfree N. 08/05/2011, 10:40 PM

## 2011-08-05 NOTE — ED Notes (Signed)
Patient to room 19 at this time.

## 2011-08-05 NOTE — ED Provider Notes (Addendum)
History     CSN: [id number]  Arrival date & time 08/05/11  9562   First MD Initiated Contact with Patient 08/05/11 2037      Chief Complaint  Patient presents with  . Chest Pain    (Consider location/radiation/quality/duration/timing/severity/associated sxs/prior treatment) HPI Comments: Patient presents with intermittent chest tightness and shortness of breath for the past 3 days. The pain comes and goes and lasts for minutes at a time it radiates into her left arm and chest. It sometimes wakes her from sleep. She came in today because her blood pressure was elevated greater than 200. Is currently chest pain-free. She's also [facility] cardiology and Dr. Clarene Duke.  She denies coronary artery disease but has a history of LAD aneurysm with stent. Denies any leg pain, swelling, abdominal pain, nausea vomiting, dizziness or lightheadedness. She states compliance with her medications. Nothing makes the pain better or worse.  The history is provided by the patient.    Past Medical History  Diagnosis Date  . Hypertension   . Coronary artery disease     Past Surgical History  Procedure Date  . Coronary artery bypass graft   . Abdominal hysterectomy   . Appendectomy   . Cesarean section     Family History  Problem Relation Age of Onset  . Stroke Mother   . Coronary artery disease Father   . Breast cancer Maternal Aunt     History  Substance Use Topics  . Smoking status: Current Everyday Smoker -- 0.5 packs/day    Types: Cigarettes  . Smokeless tobacco: Not on file  . Alcohol Use: No    OB History    Grav Para Term Preterm Abortions TAB SAB Ect Mult Living                  Review of Systems  Cardiovascular: Positive for chest pain.    Allergies  Codeine; Penicillins; and Prednisone  Home Medications   No current outpatient prescriptions on file.  BP 150/91  Pulse 58  Temp(Src) 98 F (36.7 C) (Oral)  Resp 19  Ht 5\' 1"  (1.549 m)  Wt 145 lb 8.1 oz (66 kg)   BMI 27.49 kg/m2  SpO2 98%  Physical Exam  ED Course  Procedures (including critical care time)  Labs Reviewed  CBC - Abnormal; Notable for the following:    WBC 13.5 (*)    All other components within normal limits  BASIC METABOLIC PANEL - Abnormal; Notable for the following:    Potassium 3.4 (*)    Glucose, Bld 123 (*)    Creatinine, Ser 0.43 (*)    All other components within normal limits  PRO B NATRIURETIC PEPTIDE - Abnormal; Notable for the following:    Pro B Natriuretic peptide (BNP) 967.1 (*)    All other components within normal limits  TROPONIN I  CARDIAC PANEL(CRET KIN+CKTOT+MB+TROPI)  CARDIAC PANEL(CRET KIN+CKTOT+MB+TROPI)  CARDIAC PANEL(CRET KIN+CKTOT+MB+TROPI)  CARDIAC PANEL(CRET KIN+CKTOT+MB+TROPI)  COMPREHENSIVE METABOLIC PANEL  CBC  TSH  LIPID PANEL  CBC  CREATININE, SERUM  PRO B NATRIURETIC PEPTIDE   Dg Chest 2 View  08/05/2011  *RADIOLOGY REPORT*  Clinical Data: Chest tightness.  Shortness of breath.  Smoker.  CHEST - 2 VIEW  Comparison: Report dated 09/20/2001.  Findings: Borderline enlarged cardiac silhouette.  Post CABG changes.  Clear lungs.  The lungs are mildly hyperexpanded with mild prominence of the pulmonary vasculature and interstitial markings.  Thoracic spine degenerative changes.  IMPRESSION:  1.  Borderline cardiomegaly and mild  pulmonary vascular congestion. 2.  Mild changes of COPD.  Original Report Authenticated By: Darrol Angel, M.D.     1. Chest pain       MDM  Patient presents with 3 days of chest tightness and shortness of breath is intermittent. Chills and hypertension at home. She is a history of LAD aneurysm with bypass 2 years ago. She is similar pain prior to this. His chest pain-free at this time.  Initial labs unremarkable, cardiac enzymes negative, chest x-ray benign.  Symptoms concerning for angina. Continue aspirin nitroglycerin. Patient is a patient of [facility] cardiology.    Date: 08/05/2011  Rate: 60   Rhythm: normal sinus rhythm  QRS Axis: normal  Intervals: normal  ST/T Wave abnormalities: normal  Conduction Disutrbances:none  Narrative Interpretation:   Old EKG Reviewed: unchanged CRITICAL CARE Performed by: Glynn Octave   Total critical care time: 30  Critical care time was exclusive of separately billable procedures and treating other patients.  Critical care was necessary to treat or prevent imminent or life-threatening deterioration.  Critical care was time spent personally by me on the following activities: development of treatment plan with patient and/or surrogate as well as nursing, discussions with consultants, evaluation of patient's response to treatment, examination of patient, obtaining history from patient or surrogate, ordering and performing treatments and interventions, ordering and review of laboratory studies, ordering and review of radiographic studies, pulse oximetry and re-evaluation of patient's condition.        Glynn Octave, MD 08/06/11 0030  Glynn Octave, MD 08/06/11 0030

## 2011-08-06 ENCOUNTER — Encounter (HOSPITAL_COMMUNITY): Payer: Self-pay | Admitting: General Practice

## 2011-08-06 ENCOUNTER — Other Ambulatory Visit: Payer: Self-pay

## 2011-08-06 LAB — COMPREHENSIVE METABOLIC PANEL
ALT: 13 U/L (ref 0–35)
Alkaline Phosphatase: 69 U/L (ref 39–117)
CO2: 27 mEq/L (ref 19–32)
Calcium: 9.7 mg/dL (ref 8.4–10.5)
GFR calc Af Amer: >90 mL/min (ref >90)
GFR calc non Af Amer: >90 mL/min (ref >90)
Glucose, Bld: 129 mg/dL — ABNORMAL HIGH (ref 70–99)
Sodium: 143 mEq/L (ref 135–145)

## 2011-08-06 LAB — TSH: TSH: 0.089 u[IU]/mL — ABNORMAL LOW (ref 0.350–4.500)

## 2011-08-06 LAB — PRO B NATRIURETIC PEPTIDE: Pro B Natriuretic peptide (BNP): 829 pg/mL — ABNORMAL HIGH (ref 0–125)

## 2011-08-06 LAB — CBC
Hemoglobin: 11.5 g/dL — ABNORMAL LOW (ref 12.0–15.0)
Hemoglobin: 11.9 g/dL — ABNORMAL LOW (ref 12.0–15.0)
MCH: 31.1 pg (ref 26.0–34.0)
MCHC: 34.3 g/dL (ref 30.0–36.0)
MCV: 90.8 fL (ref 78.0–100.0)
RBC: 3.7 MIL/uL — ABNORMAL LOW (ref 3.87–5.11)
RDW: 12.8 % (ref 11.5–15.5)

## 2011-08-06 LAB — CARDIAC PANEL(CRET KIN+CKTOT+MB+TROPI)
CK, MB: 2.5 ng/mL (ref 0.3–4.0)
CK, MB: 2.4 ng/mL (ref 0.3–4.0)
Total CK: 41 U/L (ref 7–177)
Total CK: 44 U/L (ref 7–177)
Troponin I: <0.3 ng/mL (ref <0.30)

## 2011-08-06 LAB — GLUCOSE, CAPILLARY

## 2011-08-06 LAB — LIPID PANEL: Cholesterol: 187 mg/dL (ref 0–200)

## 2011-08-06 MED ORDER — ACETAMINOPHEN 650 MG RE SUPP: 650.0000 mg | Freq: Four times a day (QID) | RECTAL | Status: DC | PRN

## 2011-08-06 MED ORDER — ENALAPRIL MALEATE 20 MG PO TABS
20.0000 mg | ORAL_TABLET | Freq: Every day | ORAL | Status: DC
  Administered 2011-08-06 – 2011-08-10 (×5): 20 mg via ORAL
  Filled 2011-08-06 (×5): qty 1

## 2011-08-06 MED ORDER — ONDANSETRON HCL 4 MG/2ML IJ SOLN: 4.0000 mg | Freq: Four times a day (QID) | INTRAMUSCULAR | Status: DC | PRN

## 2011-08-06 MED ORDER — ASPIRIN EC 325 MG PO TBEC
325.0000 mg | DELAYED_RELEASE_TABLET | Freq: Every day | ORAL | Status: DC
  Administered 2011-08-06 – 2011-08-09 (×4): 325 mg via ORAL
  Filled 2011-08-06 (×4): qty 1

## 2011-08-06 MED ORDER — ALPRAZOLAM 0.25 MG PO TABS
0.2500 mg | ORAL_TABLET | Freq: Two times a day (BID) | ORAL | Status: DC
  Administered 2011-08-06 (×3): 0.25 mg via ORAL
  Filled 2011-08-06 (×4): qty 1

## 2011-08-06 MED ORDER — SODIUM CHLORIDE 0.9 % IJ SOLN: 3.0000 mL | Freq: Two times a day (BID) | INTRAMUSCULAR | Status: DC

## 2011-08-06 MED ORDER — SODIUM CHLORIDE 0.9 % IJ SOLN
3.0000 mL | Freq: Two times a day (BID) | INTRAMUSCULAR | Status: DC
  Administered 2011-08-06 – 2011-08-07 (×5): 3 mL via INTRAVENOUS

## 2011-08-06 MED ORDER — ENOXAPARIN SODIUM 40 MG/0.4ML ~~LOC~~ SOLN
40.0000 mg | Freq: Every day | SUBCUTANEOUS | Status: DC
  Administered 2011-08-06 – 2011-08-08 (×3): 40 mg via SUBCUTANEOUS
  Filled 2011-08-06 (×3): qty 0.4

## 2011-08-06 MED ORDER — TIMOLOL MALEATE 0.25 % OP SOLN
1.0000 [drp] | Freq: Two times a day (BID) | OPHTHALMIC | Status: DC
  Administered 2011-08-06 – 2011-08-10 (×7): 1 [drp] via OPHTHALMIC
  Filled 2011-08-06 (×3): qty 5

## 2011-08-06 MED ORDER — ALBUTEROL SULFATE (5 MG/ML) 0.5% IN NEBU: 2.5000 mg | INHALATION_SOLUTION | RESPIRATORY_TRACT | Status: DC | PRN

## 2011-08-06 MED ORDER — ONDANSETRON HCL 4 MG PO TABS: 4.0000 mg | ORAL_TABLET | Freq: Four times a day (QID) | ORAL | Status: DC | PRN

## 2011-08-06 MED ORDER — ACETAMINOPHEN 325 MG PO TABS
650.0000 mg | ORAL_TABLET | Freq: Four times a day (QID) | ORAL | Status: DC | PRN
  Administered 2011-08-06 – 2011-08-09 (×4): 650 mg via ORAL
  Filled 2011-08-06 (×4): qty 2

## 2011-08-06 MED ORDER — AMLODIPINE BESYLATE 5 MG PO TABS
5.0000 mg | ORAL_TABLET | Freq: Every day | ORAL | Status: DC
  Administered 2011-08-06 – 2011-08-08 (×3): 5 mg via ORAL
  Filled 2011-08-06 (×4): qty 1

## 2011-08-06 MED ORDER — ISOSORBIDE MONONITRATE ER 60 MG PO TB24
60.0000 mg | ORAL_TABLET | Freq: Every day | ORAL | Status: DC
  Administered 2011-08-06 – 2011-08-09 (×4): 60 mg via ORAL
  Filled 2011-08-06 (×5): qty 1

## 2011-08-06 NOTE — Progress Notes (Signed)
Utilization Review Completed.Meredith Gutierrez T2/22/2013   

## 2011-08-06 NOTE — Progress Notes (Signed)
Admitted pt to rm 4731-1 from ED via stretcher, pt alert and oriented, denies pain at this time. Oriented to room, call bell placed within reach, SB on heart monitor, admission assessment done, orders carried out. Will continue to monitor.    Meredith Gutierrez

## 2011-08-06 NOTE — Progress Notes (Signed)
*  PRELIMINARY RESULTS* Echocardiogram 2D Echocardiogram has been performed.  Meredith Gutierrez 08/06/2011, 3:09 PM

## 2011-08-06 NOTE — Consult Note (Addendum)
Pt. Seen and examined. Agree with the NP/PA-C note as written.  I suspect mild diastolic heart failure. Symptoms improved with nitro.  Some signs of mild volume overload, BNP is ~800.  Will check 2D echo today.  Plan improved blood pressure control. Counseled about stopping salt in her diet. Will change nitrates to imdur daily.  Consider d/c timolol given bradycardia. Add norvasc 5 mg daily.  We will assume care from [facility]. Thanks for your assistance overnight.  Chrystie Nose, MD, [facility] Attending Cardiologist [facility]

## 2011-08-06 NOTE — Consult Note (Signed)
Reason for Consult: chest pain   Referring Physician: [facility]   Meredith Gutierrez is an 71 y.o. female.    Chief Complaint: chest pain    HPI: 71 year-old female with history of hypertension CAD status post CABG in 2003 presented to the ER because of ongoing chest pain. Patient has been experiencing chest pain last 3 days. The chest pain is in the anterior chest wall feels like a pressure and last for a few seconds but is associated with exertion and shortness of breath. Denies any associated nausea vomiting diaphoresis or dizziness. Yesterday when patient checked her blood pressure at home it was found to be very high and at the moment she called EMS. Cardiac enzymes and EKG does not show anything acute at this time. Chest x-ray shows mild congestion. Patient will be admitted for further management. Patient denies any abdominal pain fever chills cough or phlegm or any focal deficits.  Patient's last nuclear stress test was February of 2012 which was negative for ischemia. Ejection fraction cannot be calculated because of ectopy. No chest pains since her bypass surgery. She had did have one episode of syncope in December 2011 and had a concussion with mild memory changes.  Other history includes significant leg weakness from the study and statin therapy and these were DC'd with improving leg weakness. She also has significant tobacco abuse and no intentions of stopping per her statement in September 2012. She also is noted to be beta blocker intolerance.  Cardiac enzymes are negative. EKG sinus bradycardia rate of 51 and in no acute changes.  She developed this over the last 3 days was associated with headache her blood pressure at home is 228 systolic. She has chronic episodic lightheadedness and dizziness since her concussion and 2011. In the ER she was given nitroglycerin and her chest pressure as well as her shortness of breath improved along with decrease in blood pressure.  Past  Medical History  Diagnosis Date  . Hypertension   . Coronary artery disease   . Anxiety   . Angina     Past Surgical History  Procedure Date  . Coronary artery bypass graft   . Abdominal hysterectomy   . Appendectomy   . Cesarean section     Family History  Problem Relation Age of Onset  . Stroke Mother   . Hypotension Mother   . Coronary artery disease Father   . Hypotension Father   . Breast cancer Maternal Aunt   . Hypotension Sister   . Hypotension Brother    Social History:  reports that she has been smoking Cigarettes.  She has a 15 pack-year smoking history. She does not have any smokeless tobacco history on file. She reports that she does not drink alcohol or use illicit drugs.she is widowed and lives with her daughter and has 2 children.  Allergies:  Allergies  Allergen Reactions  . Codeine     Shortness of breath  . Penicillins     Shortness of breath  . Prednisone     Shortness of breath    Medications Prior to Admission  Medication Dose Route Frequency Provider Last Rate Last Dose  . acetaminophen (TYLENOL) tablet 650 mg  650 mg Oral Q6H PRN Eduard Clos, MD       Or  . acetaminophen (TYLENOL) suppository 650 mg  650 mg Rectal Q6H PRN Eduard Clos, MD      . albuterol (PROVENTIL) (5 MG/ML) 0.5% nebulizer solution 2.5 mg  2.5 mg Nebulization Q2H PRN Eduard Clos, MD      . ALPRAZolam Prudy Feeler) tablet 0.25 mg  0.25 mg Oral BID Eduard Clos, MD   0.25 mg at 08/06/11 0100  . aspirin EC tablet 325 mg  325 mg Oral Daily Eduard Clos, MD      . aspirin tablet 325 mg  325 mg Oral STAT Glynn Octave, MD   325 mg at 08/05/11 2057  . enalapril (VASOTEC) tablet 20 mg  20 mg Oral Daily Eduard Clos, MD      . enoxaparin (LOVENOX) injection 40 mg  40 mg Subcutaneous Daily Eduard Clos, MD      . nitroGLYCERIN (NITROGLYN) 2 % ointment 1 inch  1 inch Topical Q6H Glynn Octave, MD   1 inch at 08/05/11 2331  .  nitroGLYCERIN (NITROSTAT) SL tablet 0.4 mg  0.4 mg Sublingual Q5 min PRN Glynn Octave, MD   0.4 mg at 08/05/11 2058  . ondansetron (ZOFRAN) tablet 4 mg  4 mg Oral Q6H PRN Eduard Clos, MD       Or  . ondansetron [facility]) injection 4 mg  4 mg Intravenous Q6H PRN Eduard Clos, MD      . sodium chloride 0.9 % injection 3 mL  3 mL Intravenous Q12H Eduard Clos, MD   3 mL at 08/06/11 0100  . sodium chloride 0.9 % injection 3 mL  3 mL Intravenous Q12H Eduard Clos, MD      . timolol (TIMOPTIC) 0.25 % ophthalmic solution 1 drop  1 drop Both Eyes BID Eduard Clos, MD       No current outpatient prescriptions on file as of 08/06/2011.   Outpatient Medications:   Alprazolam 0.25 mg twice a day Aspirin 81 mg daily  Vasotec 20 mg daily Timolol 0.25% eyedrops 1 drop both sides twice a day   Results for orders placed during the hospital encounter of 08/05/11 (from the past 48 hour(s))  CBC     Status: Abnormal   Collection Time   08/05/11  6:58 PM      Component Value Range Comment   WBC 13.5 (*) 4.0 - 10.5 (K/uL)    RBC 4.11  3.87 - 5.11 (MIL/uL)    Hemoglobin 13.2  12.0 - 15.0 (g/dL)    HCT 82.9  56.2 - 13.0 (%)    MCV 90.0  78.0 - 100.0 (fL)    MCH 32.1  26.0 - 34.0 (pg)    MCHC 35.7  30.0 - 36.0 (g/dL)    RDW 86.5  78.4 - 69.6 (%)    Platelets 283  150 - 400 (K/uL)   BASIC METABOLIC PANEL     Status: Abnormal   Collection Time   08/05/11  6:58 PM      Component Value Range Comment   Sodium 141  135 - 145 (mEq/L)    Potassium 3.4 (*) 3.5 - 5.1 (mEq/L)    Chloride 104  96 - 112 (mEq/L)    CO2 26  19 - 32 (mEq/L)    Glucose, Bld 123 (*) 70 - 99 (mg/dL)    BUN 18  6 - 23 (mg/dL)    Creatinine, Ser 2.95 (*) 0.50 - 1.10 (mg/dL)    Calcium 9.8  8.4 - 10.5 (mg/dL)    GFR calc non Af Amer >90  >90 (mL/min)    GFR calc Af Amer >90  >90 (mL/min)   PRO B NATRIURETIC PEPTIDE  Status: Abnormal   Collection Time   08/05/11  6:58 PM      Component Value  Range Comment   Pro B Natriuretic peptide (BNP) 967.1 (*) 0 - 125 (pg/mL)   TROPONIN I     Status: Normal   Collection Time   08/05/11  6:58 PM      Component Value Range Comment   Troponin I <0.30  <0.30 (ng/mL)   CARDIAC PANEL(CRET KIN+CKTOT+MB+TROPI)     Status: Normal   Collection Time   08/05/11  9:35 PM      Component Value Range Comment   Total CK 51  7 - 177 (U/L)    CK, MB 2.8  0.3 - 4.0 (ng/mL)    Troponin I <0.30  <0.30 (ng/mL)    Relative Index RELATIVE INDEX IS INVALID  0.0 - 2.5    CARDIAC PANEL(CRET KIN+CKTOT+MB+TROPI)     Status: Normal   Collection Time   08/06/11 12:46 AM      Component Value Range Comment   Total CK 83  7 - 177 (U/L) HEMOLYSIS AT THIS LEVEL MAY AFFECT RESULT   CK, MB 2.5  0.3 - 4.0 (ng/mL)    Troponin I <0.30  <0.30 (ng/mL)    Relative Index RELATIVE INDEX IS INVALID  0.0 - 2.5    LIPID PANEL     Status: Abnormal   Collection Time   08/06/11 12:46 AM      Component Value Range Comment   Cholesterol 187  0 - 200 (mg/dL)    Triglycerides 469 (*) <150 (mg/dL)    HDL 35 (*) >62 (mg/dL)    Total CHOL/HDL Ratio 5.3      VLDL 31  0 - 40 (mg/dL)    LDL Cholesterol 952 (*) 0 - 99 (mg/dL)   CBC     Status: Abnormal   Collection Time   08/06/11 12:46 AM      Component Value Range Comment   WBC 11.3 (*) 4.0 - 10.5 (K/uL)    RBC 3.70 (*) 3.87 - 5.11 (MIL/uL)    Hemoglobin 11.5 (*) 12.0 - 15.0 (g/dL)    HCT 84.1 (*) 32.4 - 46.0 (%)    MCV 90.8  78.0 - 100.0 (fL)    MCH 31.1  26.0 - 34.0 (pg)    MCHC 34.2  30.0 - 36.0 (g/dL)    RDW 40.1  02.7 - 25.3 (%)    Platelets 234  150 - 400 (K/uL)   CREATININE, SERUM     Status: Abnormal   Collection Time   08/06/11 12:46 AM      Component Value Range Comment   Creatinine, Ser 0.45 (*) 0.50 - 1.10 (mg/dL)    GFR calc non Af Amer >90  >90 (mL/min)    GFR calc Af Amer >90  >90 (mL/min)   COMPREHENSIVE METABOLIC PANEL     Status: Abnormal   Collection Time   08/06/11  5:55 AM      Component Value Range Comment    Sodium 143  135 - 145 (mEq/L)    Potassium 4.0  3.5 - 5.1 (mEq/L)    Chloride 108  96 - 112 (mEq/L)    CO2 27  19 - 32 (mEq/L)    Glucose, Bld 129 (*) 70 - 99 (mg/dL)    BUN 15  6 - 23 (mg/dL)    Creatinine, Ser 6.64 (*) 0.50 - 1.10 (mg/dL)    Calcium 9.7  8.4 - 10.5 (mg/dL)  Total Protein 5.9 (*) 6.0 - 8.3 (g/dL)    Albumin 3.3 (*) 3.5 - 5.2 (g/dL)    AST 10  0 - 37 (U/L)    ALT 13  0 - 35 (U/L)    Alkaline Phosphatase 69  39 - 117 (U/L)    Total Bilirubin 0.3  0.3 - 1.2 (mg/dL)    GFR calc non Af Amer >90  >90 (mL/min)    GFR calc Af Amer >90  >90 (mL/min)   CBC     Status: Abnormal   Collection Time   08/06/11  5:55 AM      Component Value Range Comment   WBC 10.9 (*) 4.0 - 10.5 (K/uL)    RBC 3.82 (*) 3.87 - 5.11 (MIL/uL)    Hemoglobin 11.9 (*) 12.0 - 15.0 (g/dL)    HCT 09.8 (*) 11.9 - 46.0 (%)    MCV 90.8  78.0 - 100.0 (fL)    MCH 31.2  26.0 - 34.0 (pg)    MCHC 34.3  30.0 - 36.0 (g/dL)    RDW 14.7  82.9 - 56.2 (%)    Platelets 245  150 - 400 (K/uL)   PRO B NATRIURETIC PEPTIDE     Status: Abnormal   Collection Time   08/06/11  5:55 AM      Component Value Range Comment   Pro B Natriuretic peptide (BNP) 829.0 (*) 0 - 125 (pg/mL)    Dg Chest 2 View  08/05/2011  *RADIOLOGY REPORT*  Clinical Data: Chest tightness.  Shortness of breath.  Smoker.  CHEST - 2 VIEW  Comparison: Report dated 09/20/2001.  Findings: Borderline enlarged cardiac silhouette.  Post CABG changes.  Clear lungs.  The lungs are mildly hyperexpanded with mild prominence of the pulmonary vasculature and interstitial markings.  Thoracic spine degenerative changes.  IMPRESSION:  1.  Borderline cardiomegaly and mild pulmonary vascular congestion. 2.  Mild changes of COPD.  Original Report Authenticated By: Darrol Angel, M.D.    ROS: General:no colds or fevers Skin:no rashes or ulcers HEENT:no congestion or sinus complaints CV:no chest pain prior to the last 3 days along with hypertension ZHY:[id number] of  breath for the last 3 days GI:no diarrhea constipation or melena GU:no hematuria or Tischer MS:she has been having leg pain and been seeing a chiropractor who has been doing manipulations with improvement of her leg pain Neuro:no syncope, no change in her lightheadedness or dizziness Endo:no diabetes or thyroid disease   Blood pressure 143/77, pulse 49, temperature 98.2 F (36.8 C), temperature source Oral, resp. rate 19, height 5\' 1"  (1.549 m), weight 65.4 kg (144 lb 2.9 oz), SpO2 96.00%. PE: General:alert oriented, pleasant affect, white female in no acute distress Skin: warm and dry brisk capillary refill HEENT:normocephalic sclera clear Neck:supple, no JVD no carotid bruits Heart:S1-S2 regular rate and rhythm no obvious murmur Lungs:clear without rales rhonchi or wheezes EXB:MWUX nontender positive bowel sounds do not palpate liver spleen or masses Ext:no lower extremity edema 1+ pedal pulse bilaterally Neuro:alert and oriented white female, moves all extremities, follows commands, oriented x3    Assessment/Plan Patient Active Problem List  Diagnoses  . Chest pain  . HTN (hypertension)  . Tobacco abuse  . CAD (coronary artery disease), CABG 08/24/2001, off pump, LIMA to LAD, SVG to 1st diagonal. Ligation of LAD artery aneurysm.   PLAN: Negative cardiac enzymes, EKG without acute changes. Grafts are 71 years old. Mild heart failure on admission with elevated BNP. Also hypertensive most likely diastolic heart failure.  Plan 2-D echo, see Dr. Blanchie Dessert note .   Will be glad to take on our service.   Allyn Bartelson R 08/06/2011, 9:13 AM

## 2011-08-07 DIAGNOSIS — R001 Bradycardia, unspecified: Secondary | ICD-10-CM | POA: Diagnosis present

## 2011-08-07 DIAGNOSIS — E785 Hyperlipidemia, unspecified: Secondary | ICD-10-CM | POA: Diagnosis present

## 2011-08-07 DIAGNOSIS — E1169 Type 2 diabetes mellitus with other specified complication: Secondary | ICD-10-CM | POA: Diagnosis present

## 2011-08-07 LAB — SURGICAL PCR SCREEN
MRSA, PCR: NEGATIVE
Staphylococcus aureus: POSITIVE — AB

## 2011-08-07 LAB — PROTIME-INR
INR: 1.11 (ref 0.00–1.49)
Prothrombin Time: 14.5 seconds (ref 11.6–15.2)

## 2011-08-07 LAB — APTT: aPTT: 28 seconds (ref 24–37)

## 2011-08-07 LAB — GLUCOSE, CAPILLARY: Glucose-Capillary: 104 mg/dL — ABNORMAL HIGH (ref 70–99)

## 2011-08-07 MED ORDER — SODIUM CHLORIDE 0.9 % IJ SOLN
3.0000 mL | Freq: Two times a day (BID) | INTRAMUSCULAR | Status: DC
  Administered 2011-08-08: 3 mL via INTRAVENOUS

## 2011-08-07 MED ORDER — ALPRAZOLAM 0.25 MG PO TABS
0.2500 mg | ORAL_TABLET | Freq: Two times a day (BID) | ORAL | Status: DC | PRN
  Administered 2011-08-07 – 2011-08-09 (×4): 0.25 mg via ORAL
  Filled 2011-08-07 (×3): qty 1

## 2011-08-07 MED ORDER — SODIUM CHLORIDE 0.9 % IV SOLN: 250.0000 mL | INTRAVENOUS | Status: DC | PRN

## 2011-08-07 MED ORDER — MUPIROCIN 2 % EX OINT
1.0000 "application " | TOPICAL_OINTMENT | Freq: Two times a day (BID) | CUTANEOUS | Status: DC
  Administered 2011-08-07 – 2011-08-10 (×5): 1 via NASAL
  Filled 2011-08-07: qty 22

## 2011-08-07 MED ORDER — CHLORHEXIDINE GLUCONATE CLOTH 2 % EX PADS
6.0000 | MEDICATED_PAD | Freq: Every day | CUTANEOUS | Status: DC
  Administered 2011-08-07 – 2011-08-10 (×4): 6 via TOPICAL

## 2011-08-07 MED ORDER — SODIUM CHLORIDE 0.9 % IJ SOLN: 3.0000 mL | INTRAMUSCULAR | Status: DC | PRN

## 2011-08-07 MED ORDER — DIAZEPAM 5 MG PO TABS
5.0000 mg | ORAL_TABLET | ORAL | Status: AC
  Administered 2011-08-09: 5 mg via ORAL
  Filled 2011-08-07: qty 1

## 2011-08-07 MED ORDER — SODIUM CHLORIDE 0.9 % IV SOLN: 1.0000 mL/kg/h | INTRAVENOUS | Status: DC

## 2011-08-07 NOTE — Progress Notes (Signed)
Subjective:  No CP/SOB  Objective:  Temp:  [97.5 F (36.4 C)-98.2 F (36.8 C)] 98 F (36.7 C) (02/23 0653) Pulse Rate:  [47-59] 47  (02/23 0653) Resp:  [20] 20  (02/23 0653) BP: (135-183)/(65-78) 139/73 mmHg (02/23 0653) SpO2:  [97 %-100 %] 100 % (02/23 0653) Weight:  [65.7 kg (144 lb 13.5 oz)] 65.7 kg (144 lb 13.5 oz) (02/23 0653) Weight change: -0.3 kg (-10.6 oz)  Intake/Output from previous day: 02/22 0701 - 02/23 0700 In: 963 [P.O.:960; I.V.:3] Out: 3200 [Urine:3200]  Intake/Output from this shift:    Physical Exam: General appearance: alert and cooperative Neck: no adenopathy, no carotid bruit, no JVD, supple, symmetrical, trachea midline and thyroid not enlarged, symmetric, no tenderness/mass/nodules Lungs: clear to auscultation bilaterally Heart: regular rate and rhythm, S1, S2 normal, no murmur, click, rub or gallop Extremities: extremities normal, atraumatic, no cyanosis or edema  Lab Results: Results for orders placed during the hospital encounter of 08/05/11 (from the past 48 hour(s))  CBC     Status: Abnormal   Collection Time   08/05/11  6:58 PM      Component Value Range Comment   WBC 13.5 (*) 4.0 - 10.5 (K/uL)    RBC 4.11  3.87 - 5.11 (MIL/uL)    Hemoglobin 13.2  12.0 - 15.0 (g/dL)    HCT 16.1  09.6 - 04.5 (%)    MCV 90.0  78.0 - 100.0 (fL)    MCH 32.1  26.0 - 34.0 (pg)    MCHC 35.7  30.0 - 36.0 (g/dL)    RDW 40.9  81.1 - 91.4 (%)    Platelets 283  150 - 400 (K/uL)   BASIC METABOLIC PANEL     Status: Abnormal   Collection Time   08/05/11  6:58 PM      Component Value Range Comment   Sodium 141  135 - 145 (mEq/L)    Potassium 3.4 (*) 3.5 - 5.1 (mEq/L)    Chloride 104  96 - 112 (mEq/L)    CO2 26  19 - 32 (mEq/L)    Glucose, Bld 123 (*) 70 - 99 (mg/dL)    BUN 18  6 - 23 (mg/dL)    Creatinine, Ser 7.82 (*) 0.50 - 1.10 (mg/dL)    Calcium 9.8  8.4 - 10.5 (mg/dL)    GFR calc non Af Amer >90  >90 (mL/min)    GFR calc Af Amer >90  >90 (mL/min)   PRO B  NATRIURETIC PEPTIDE     Status: Abnormal   Collection Time   08/05/11  6:58 PM      Component Value Range Comment   Pro B Natriuretic peptide (BNP) 967.1 (*) 0 - 125 (pg/mL)   TROPONIN I     Status: Normal   Collection Time   08/05/11  6:58 PM      Component Value Range Comment   Troponin I <0.30  <0.30 (ng/mL)   CARDIAC PANEL(CRET KIN+CKTOT+MB+TROPI)     Status: Normal   Collection Time   08/05/11  9:35 PM      Component Value Range Comment   Total CK 51  7 - 177 (U/L)    CK, MB 2.8  0.3 - 4.0 (ng/mL)    Troponin I <0.30  <0.30 (ng/mL)    Relative Index RELATIVE INDEX IS INVALID  0.0 - 2.5    CARDIAC PANEL(CRET KIN+CKTOT+MB+TROPI)     Status: Normal   Collection Time   08/06/11 12:46 AM  Component Value Range Comment   Total CK 83  7 - 177 (U/L) HEMOLYSIS AT THIS LEVEL MAY AFFECT RESULT   CK, MB 2.5  0.3 - 4.0 (ng/mL)    Troponin I <0.30  <0.30 (ng/mL)    Relative Index RELATIVE INDEX IS INVALID  0.0 - 2.5    LIPID PANEL     Status: Abnormal   Collection Time   08/06/11 12:46 AM      Component Value Range Comment   Cholesterol 187  0 - 200 (mg/dL)    Triglycerides 161 (*) <150 (mg/dL)    HDL 35 (*) >09 (mg/dL)    Total CHOL/HDL Ratio 5.3      VLDL 31  0 - 40 (mg/dL)    LDL Cholesterol 604 (*) 0 - 99 (mg/dL)   CBC     Status: Abnormal   Collection Time   08/06/11 12:46 AM      Component Value Range Comment   WBC 11.3 (*) 4.0 - 10.5 (K/uL)    RBC 3.70 (*) 3.87 - 5.11 (MIL/uL)    Hemoglobin 11.5 (*) 12.0 - 15.0 (g/dL)    HCT 54.0 (*) 98.1 - 46.0 (%)    MCV 90.8  78.0 - 100.0 (fL)    MCH 31.1  26.0 - 34.0 (pg)    MCHC 34.2  30.0 - 36.0 (g/dL)    RDW 19.1  47.8 - 29.5 (%)    Platelets 234  150 - 400 (K/uL)   CREATININE, SERUM     Status: Abnormal   Collection Time   08/06/11 12:46 AM      Component Value Range Comment   Creatinine, Ser 0.45 (*) 0.50 - 1.10 (mg/dL)    GFR calc non Af Amer >90  >90 (mL/min)    GFR calc Af Amer >90  >90 (mL/min)   COMPREHENSIVE METABOLIC  PANEL     Status: Abnormal   Collection Time   08/06/11  5:55 AM      Component Value Range Comment   Sodium 143  135 - 145 (mEq/L)    Potassium 4.0  3.5 - 5.1 (mEq/L)    Chloride 108  96 - 112 (mEq/L)    CO2 27  19 - 32 (mEq/L)    Glucose, Bld 129 (*) 70 - 99 (mg/dL)    BUN 15  6 - 23 (mg/dL)    Creatinine, Ser 6.21 (*) 0.50 - 1.10 (mg/dL)    Calcium 9.7  8.4 - 10.5 (mg/dL)    Total Protein 5.9 (*) 6.0 - 8.3 (g/dL)    Albumin 3.3 (*) 3.5 - 5.2 (g/dL)    AST 10  0 - 37 (U/L)    ALT 13  0 - 35 (U/L)    Alkaline Phosphatase 69  39 - 117 (U/L)    Total Bilirubin 0.3  0.3 - 1.2 (mg/dL)    GFR calc non Af Amer >90  >90 (mL/min)    GFR calc Af Amer >90  >90 (mL/min)   CBC     Status: Abnormal   Collection Time   08/06/11  5:55 AM      Component Value Range Comment   WBC 10.9 (*) 4.0 - 10.5 (K/uL)    RBC 3.82 (*) 3.87 - 5.11 (MIL/uL)    Hemoglobin 11.9 (*) 12.0 - 15.0 (g/dL)    HCT 30.8 (*) 65.7 - 46.0 (%)    MCV 90.8  78.0 - 100.0 (fL)    MCH 31.2  26.0 - 34.0 (pg)  MCHC 34.3  30.0 - 36.0 (g/dL)    RDW 69.6  29.5 - 28.4 (%)    Platelets 245  150 - 400 (K/uL)   TSH     Status: Abnormal   Collection Time   08/06/11  5:55 AM      Component Value Range Comment   TSH 0.089 (*) 0.350 - 4.500 (uIU/mL)   PRO B NATRIURETIC PEPTIDE     Status: Abnormal   Collection Time   08/06/11  5:55 AM      Component Value Range Comment   Pro B Natriuretic peptide (BNP) 829.0 (*) 0 - 125 (pg/mL)   CARDIAC PANEL(CRET KIN+CKTOT+MB+TROPI)     Status: Normal   Collection Time   08/06/11 10:10 AM      Component Value Range Comment   Total CK 41  7 - 177 (U/L)    CK, MB 2.4  0.3 - 4.0 (ng/mL)    Troponin I <0.30  <0.30 (ng/mL)    Relative Index RELATIVE INDEX IS INVALID  0.0 - 2.5    GLUCOSE, CAPILLARY     Status: Abnormal   Collection Time   08/06/11  4:09 PM      Component Value Range Comment   Glucose-Capillary 113 (*) 70 - 99 (mg/dL)    Comment 1 Notify RN     CARDIAC PANEL(CRET  KIN+CKTOT+MB+TROPI)     Status: Normal   Collection Time   08/06/11  4:22 PM      Component Value Range Comment   Total CK 44  7 - 177 (U/L)    CK, MB 2.3  0.3 - 4.0 (ng/mL)    Troponin I <0.30  <0.30 (ng/mL)    Relative Index RELATIVE INDEX IS INVALID  0.0 - 2.5    GLUCOSE, CAPILLARY     Status: Abnormal   Collection Time   08/06/11  9:25 PM      Component Value Range Comment   Glucose-Capillary 119 (*) 70 - 99 (mg/dL)    Comment 1 Notify RN     GLUCOSE, CAPILLARY     Status: Abnormal   Collection Time   08/07/11  6:56 AM      Component Value Range Comment   Glucose-Capillary 108 (*) 70 - 99 (mg/dL)    Comment 1 Notify RN       Imaging: Imaging results have been reviewed  Assessment/Plan:   1. Principal Problem: 2.  *Chest pain 3. Active Problems: 4.  HTN (hypertension), accelerated on admit 5.  Tobacco abuse 6.  CAD (coronary artery disease), CABG 08/24/2001, off pump, LIMA to LAD, SVG to 1st diagonal. Ligation of LAD artery aneurysm. 7.  Dyslipidemia, statin intol. 8.   Time Spent Directly with Patient:  20 minutes  Length of Stay:  LOS: 2 days   Admitted with CP/shoulder pain with LUE radiation and SOB. Enz neg. 2D pending. 10 years S/P CABG with continued tobacco abuse. Exam benign. Plan cor angio with Dr. Salena Saner on Monday. Pt agreeable.   Runell Gess 08/07/2011, 10:08 AM

## 2011-08-08 LAB — GLUCOSE, CAPILLARY: Glucose-Capillary: 106 mg/dL — ABNORMAL HIGH (ref 70–99)

## 2011-08-08 MED ORDER — SODIUM CHLORIDE 0.9 % IV SOLN: 250.0000 mL | INTRAVENOUS | Status: DC | PRN

## 2011-08-08 MED ORDER — SODIUM CHLORIDE 0.9 % IJ SOLN
3.0000 mL | Freq: Two times a day (BID) | INTRAMUSCULAR | Status: DC
  Administered 2011-08-08: 3 mL via INTRAVENOUS

## 2011-08-08 MED ORDER — DIAZEPAM 5 MG PO TABS: 5.0000 mg | ORAL_TABLET | ORAL | Status: DC

## 2011-08-08 MED ORDER — SODIUM CHLORIDE 0.9 % IV SOLN
1.0000 mL/kg/h | INTRAVENOUS | Status: DC
  Administered 2011-08-08: 1 mL/kg/h via INTRAVENOUS

## 2011-08-08 MED ORDER — SODIUM CHLORIDE 0.9 % IJ SOLN: 3.0000 mL | INTRAMUSCULAR | Status: DC | PRN

## 2011-08-08 MED ORDER — ASPIRIN 81 MG PO CHEW
324.0000 mg | CHEWABLE_TABLET | ORAL | Status: AC
  Administered 2011-08-09: 324 mg via ORAL
  Filled 2011-08-08: qty 4

## 2011-08-08 NOTE — Progress Notes (Signed)
Subjective:  No chest pain  Objective:  Vital Signs in the last 24 hours: Temp:  [98 F (36.7 C)-98.6 F (37 C)] 98.6 F (37 C) (02/24 0620) Pulse Rate:  [50-60] 60  (02/24 1001) Resp:  [18-20] 18  (02/24 0658) BP: (118-170)/(50-83) 170/83 mmHg (02/24 1001) SpO2:  [96 %-98 %] 98 % (02/24 0620) Weight:  [65.6 kg (144 lb 10 oz)] 65.6 kg (144 lb 10 oz) (02/24 0620)  Intake/Output from previous day:  Intake/Output Summary (Last 24 hours) at 08/08/11 1013 Last data filed at 08/08/11 1000  Gross per 24 hour  Intake   1163 ml  Output    700 ml  Net    463 ml    Physical Exam: General appearance: alert, cooperative and no distress Lungs: clear to auscultation bilaterally Heart: regular rate and rhythm no edema   Rate: 65  Rhythm: normal sinus rhythm  Lab Results:  Basename 08/06/11 0555 08/06/11 0046  WBC 10.9* 11.3*  HGB 11.9* 11.5*  PLT 245 234    Basename 08/06/11 0555 08/06/11 0046 08/05/11 1858  NA 143 -- 141  K 4.0 -- 3.4*  CL 108 -- 104  CO2 27 -- 26  GLUCOSE 129* -- 123*  BUN 15 -- 18  CREATININE 0.49* 0.45* --    Basename 08/06/11 1622 08/06/11 1010  TROPONINI <0.30 <0.30   Hepatic Function Panel  Basename 08/06/11 0555  PROT 5.9*  ALBUMIN 3.3*  AST 10  ALT 13  ALKPHOS 69  BILITOT 0.3  BILIDIR --  IBILI --    Basename 08/06/11 0046  CHOL 187    Basename 08/07/11 1304  INR 1.11    Imaging: No results found.  Cardiac Studies:  Assessment/Plan:   Principal Problem:  *Chest pain Active Problems:  CAD (coronary artery disease), CABG 08/24/2001, off pump, LIMA to LAD, SVG to 1st diagonal. Ligation of LAD artery aneurysm.  HTN (hypertension), accelerated on admit  Tobacco abuse  Dyslipidemia, statin intol.  Sinus bradycardia, beta blocker intol.   Plan-Cath in am.   Corine Shelter PA-C 08/08/2011, 10:13 AM  Agree with note written by Corine Shelter PAC  No further CP. For cath tomorrow. Exam benign. Labs OK on  2/22  Runell Gess 08/08/2011 10:17 AM

## 2011-08-09 ENCOUNTER — Encounter (HOSPITAL_COMMUNITY)
Admission: EM | Disposition: A | Discharge status: Home / Self Care | Payer: Self-pay | Source: Ambulatory Visit | Attending: Internal Medicine

## 2011-08-09 HISTORY — PX: CARDIAC CATHETERIZATION: SHX172

## 2011-08-09 HISTORY — PX: LEFT HEART CATHETERIZATION WITH CORONARY ANGIOGRAM: SHX5451

## 2011-08-09 LAB — CBC
HCT: 36.9 % (ref 36.0–46.0)
Hemoglobin: 12.6 g/dL (ref 12.0–15.0)
MCH: 31.3 pg (ref 26.0–34.0)
MCHC: 34.1 g/dL (ref 30.0–36.0)
MCV: 91.6 fL (ref 78.0–100.0)
Platelets: 254 10*3/uL (ref 150–400)
RBC: 4.03 MIL/uL (ref 3.87–5.11)
RDW: 13.1 % (ref 11.5–15.5)
WBC: 10 10*3/uL (ref 4.0–10.5)

## 2011-08-09 LAB — BASIC METABOLIC PANEL
BUN: 11 mg/dL (ref 6–23)
CO2: 25 mEq/L (ref 19–32)
Calcium: 9.4 mg/dL (ref 8.4–10.5)
Chloride: 109 mEq/L (ref 96–112)
Creatinine, Ser: 0.46 mg/dL — ABNORMAL LOW (ref 0.50–1.10)
GFR calc Af Amer: >90 mL/min (ref >90)
GFR calc non Af Amer: >90 mL/min (ref >90)
Glucose, Bld: 98 mg/dL (ref 70–99)
Potassium: 3.6 mEq/L (ref 3.5–5.1)
Sodium: 143 mEq/L (ref 135–145)

## 2011-08-09 LAB — T3, FREE: T3, Free: 3.1 pg/mL (ref 2.3–4.2)

## 2011-08-09 LAB — T4, FREE: Free T4: 1.15 ng/dL (ref 0.80–1.80)

## 2011-08-09 SURGERY — LEFT HEART CATHETERIZATION WITH CORONARY ANGIOGRAM
Anesthesia: LOCAL

## 2011-08-09 SURGERY — CORONARY ANGIOGRAM
Anesthesia: Choice

## 2011-08-09 MED ORDER — AMLODIPINE BESYLATE 10 MG PO TABS
10.0000 mg | ORAL_TABLET | Freq: Every day | ORAL | Status: DC
  Administered 2011-08-09 – 2011-08-10 (×2): 10 mg via ORAL
  Filled 2011-08-09 (×2): qty 1

## 2011-08-09 MED ORDER — MIDAZOLAM HCL 2 MG/2ML IJ SOLN
INTRAMUSCULAR | Status: AC
  Filled 2011-08-09: qty 2

## 2011-08-09 MED ORDER — ACETAMINOPHEN 325 MG PO TABS: 650.0000 mg | ORAL_TABLET | ORAL | Status: DC | PRN

## 2011-08-09 MED ORDER — SODIUM CHLORIDE 0.9 % IJ SOLN
3.0000 mL | Freq: Two times a day (BID) | INTRAMUSCULAR | Status: DC
  Administered 2011-08-10 (×2): 3 mL via INTRAVENOUS

## 2011-08-09 MED ORDER — SODIUM CHLORIDE 0.9 % IV SOLN
250.0000 mL | INTRAVENOUS | Status: DC
  Administered 2011-08-10: 250 mL via INTRAVENOUS

## 2011-08-09 MED ORDER — SODIUM CHLORIDE 0.9 % IV SOLN: 1.0000 mL/kg/h | INTRAVENOUS | Status: AC

## 2011-08-09 MED ORDER — ASPIRIN 81 MG PO CHEW
81.0000 mg | CHEWABLE_TABLET | Freq: Every day | ORAL | Status: DC
  Administered 2011-08-10: 81 mg via ORAL
  Filled 2011-08-09: qty 1

## 2011-08-09 MED ORDER — LIDOCAINE HCL (PF) 1 % IJ SOLN
INTRAMUSCULAR | Status: AC
  Filled 2011-08-09: qty 30

## 2011-08-09 MED ORDER — NITROGLYCERIN 0.2 MG/ML ON CALL CATH LAB
INTRAVENOUS | Status: AC
  Filled 2011-08-09: qty 1

## 2011-08-09 MED ORDER — OXYCODONE-ACETAMINOPHEN 5-325 MG PO TABS: 1.0000 | ORAL_TABLET | ORAL | Status: DC | PRN

## 2011-08-09 MED ORDER — SODIUM CHLORIDE 0.9 % IJ SOLN: 3.0000 mL | INTRAMUSCULAR | Status: DC | PRN

## 2011-08-09 MED ORDER — HEPARIN (PORCINE) IN NACL 2-0.9 UNIT/ML-% IJ SOLN
INTRAMUSCULAR | Status: AC
  Filled 2011-08-09: qty 2000

## 2011-08-09 MED ORDER — FENTANYL CITRATE 0.05 MG/ML IJ SOLN
INTRAMUSCULAR | Status: AC
  Filled 2011-08-09: qty 2

## 2011-08-09 MED ORDER — ONDANSETRON HCL 4 MG/2ML IJ SOLN: 4.0000 mg | Freq: Four times a day (QID) | INTRAMUSCULAR | Status: DC | PRN

## 2011-08-09 MED ORDER — HYDRALAZINE HCL 20 MG/ML IJ SOLN
10.0000 mg | Freq: Four times a day (QID) | INTRAMUSCULAR | Status: DC | PRN
  Filled 2011-08-09 (×2): qty 0.5

## 2011-08-09 NOTE — Progress Notes (Signed)
Subjective: No pain, for cath today.  Objective: Vital signs in last 24 hours: Temp:  [98 F (36.7 C)-98.9 F (37.2 C)] 98.3 F (36.8 C) (02/25 0700) Pulse Rate:  [54-61] 57  (02/25 0700) Resp:  [18-54] 18  (02/25 0700) BP: (131-190)/(62-90) 190/82 mmHg (02/25 0700) SpO2:  [95 %-97 %] 96 % (02/25 0700) Weight:  [65.409 kg (144 lb 3.2 oz)] 65.409 kg (144 lb 3.2 oz) (02/25 0648) Weight change: -0.191 kg (-6.8 oz) Last BM Date: 08/07/11 Intake/Output from previous day:+200 02/24 0701 - 02/25 0700 In: 1429.2 [P.O.:920; I.V.:509.2] Out: 1900 [Urine:1900] Intake/Output this shift: Total I/O In: 0  Out: 1 [Stool:1]  PE: General: A&O X 3, MAE, follows commands Heart:S1S2, RRR Lungs:few rhonchi otherwise clear Abd:+BS, soft non tender Ext:no edema, + pulses.    Lab Results:  [facility] [place] 08/09/11 0650  WBC 10.0  HGB 12.6  HCT 36.9  PLT 254   BMET  Basename 08/09/11 0650  NA 143  K 3.6  CL 109  CO2 25  GLUCOSE 98  BUN 11  CREATININE 0.46*  CALCIUM 9.4    Basename 08/06/11 1622 08/06/11 1010  TROPONINI <0.30 <0.30    Lab Results  Component Value Date   CHOL 187 08/06/2011   HDL 35* 08/06/2011   LDLCALC 121* 08/06/2011   TRIG 156* 08/06/2011   CHOLHDL 5.3 08/06/2011   No results found for this basename: HGBA1C     Lab Results  Component Value Date   TSH 0.089* 08/06/2011     EKG: Orders placed during the hospital encounter of 08/05/11  . EKG    Studies/Results: No results found.  Medications: I have reviewed the patient's current medications.    Marland Kitchen amLODipine  5 mg Oral Daily  . aspirin  324 mg Oral Pre-Cath  . aspirin EC  325 mg Oral Daily  . Chlorhexidine Gluconate Cloth  6 each Topical Daily  . diazepam  5 mg Oral On Call  . enalapril  20 mg Oral Daily  . isosorbide mononitrate  60 mg Oral Daily  . mupirocin ointment  1 application Nasal BID  . timolol  1 drop Both Eyes BID  . DISCONTD: diazepam  5 mg Oral On Call  . DISCONTD: enoxaparin  40 mg  Subcutaneous Daily  . DISCONTD: sodium chloride  3 mL Intravenous Q12H  . DISCONTD: sodium chloride  3 mL Intravenous Q12H  . DISCONTD: sodium chloride  3 mL Intravenous Q12H  . DISCONTD: sodium chloride  3 mL Intravenous Q12H   Assessment/Plan: Patient Active Problem List  Diagnoses  . Chest pain  . HTN (hypertension), accelerated on admit  . Tobacco abuse  . CAD (coronary artery disease), CABG 08/24/2001, off pump, LIMA to LAD, SVG to 1st diagonal. Ligation of LAD artery aneurysm.  Marland Kitchen Dyslipidemia, statin intol.  . Sinus bradycardia, beta blocker intol.   PLAN:For cardiac cath today. Hypertensive.  Will increase Norvasc. Also TSH is low, will check free T4T3 prior to cath. maintaining SB in the 50's.  Does not tolerate beta blockers, she is on timolol eye drops.  LOS: 4 days   INGOLD,LAURA R 08/09/2011, 9:25 AM   I have seen and examined the patient along with [facility] R, NP.  I have reviewed the chart, notes and new data.  I agree with NP's note.  Key new complaints: no further angina  Key examination changes: no clinical signs of CHF or thyrotoxicosis Key new findings / data: T3-T4 pending; negative cardiac enzymes.  PLAN: Coronary angiography  and possible PCI today. Risks and benefits discussed - she agrees to proceed.  Thurmon Fair, MD, [facility] [phone] 08/09/2011, 2:58 PM

## 2011-08-09 NOTE — CV Procedure (Addendum)
Meredith Gutierrez, [place] Female, 71 y.o., 01-26-1941 MRN: [id number]  CSN: [id number]  CARDIAC CATHETERIZATION REPORT   Procedures performed:  1. Left heart catheterization  2. Selective coronary angiography  3. Left ventriculography   Reason for procedure:  CAD post previous CABG Unstable angina pectoris  Procedure performed by: Thurmon Fair, MD, [facility]  Complications: none   Estimated blood loss: less than 5 mL   History:  71 year-old female with history of hypertension CAD status post CABG in 2003 presented to the ER because of ongoing chest pain. Patient has been experiencing chest pain last 3 days. The chest pain is in the anterior chest wall feels like a pressure and last for a few seconds but is associated with exertion and shortness of breath. Denies any associated nausea vomiting diaphoresis or dizziness.  Cardiac enzymes and EKG have not shown dynamic changes.   Consent: The risks, benefits, and details of the procedure were explained to the patient. Risks including death, MI, stroke, bleeding, limb ischemia, renal failure and allergy were described and accepted by the patient. Informed written consent was obtained prior to proceeding.  Technique: The patient was brought to the cardiac catheterization laboratory in the fasting state. He was prepped and draped in the usual sterile fashion. Local anesthesia with 1% lidocaine was administered to the right groin area. Using the modified Seldinger technique a 5 French right common femoral artery sheath was introduced without difficulty. Under fluoroscopic guidance, using 5 [place] JL4, JR and angled pigtail catheters, selective cannulation of the left coronary artery, right coronary artery and left ventricle were respectively performed. A LIMA catheter was used to obtain an angiogram of the left mammary artery bypass graft. The JR catheter was used to perform angiography of the SVG to diagonal artery bypass graft. Several coronary angiograms in a  variety of projections were recorded, as well as a left ventriculogram in the RAO projection. Left ventricular pressure and a pull back to the aorta were recorded with an end-hole catheter and showed intracavitary obstruction. No immediate complications occurred. At the end of the procedure, all catheters were removed. After the procedure, hemostasis will be achieved with manual pressure.  Contrast used: 90 mL Omnipaque  Angiographic Findings:  1. The left main coronary artery is free of significant atherosclerosis and bifurcates in the usual fashion into the left anterior descending artery and left circumflex coronary artery.  2. The left anterior descending artery is a large ectatic vessel that reaches the apex and generates two major diagonal branches. There is evidence of diffuse luminal irregularities and mild calcification. No hemodynamically meaningful stenoses are seen. Competitive flow from the saphenous vein graft to the diagonal artery seen in the mid to distal LAD. There is no evidence of any competitive flow from a internal mammary artery bypass graft. 3. The left circumflex coronary artery is a small to medium-size vessel non- dominant vessel that generates 2 major oblique marginal arteries. There is evidence of diffuse luminal irregularities and minimal calcification. no hemodynamically meaningful stenoses are seen. 4. The right coronary artery is a large-size  dominant vessel that generates a very large branching posterior lateral ventricular system as well as the PDA. There is evidence of significant diffuse luminal irregularities and mild calcification. no hemodynamically meaningful stenoses are seen, although scattered 30-40% lesions are seen throughout the entire course of the vessel. The worse stenosis appears to be a 40-50% stenosis in the midportion of the right coronary artery.  5. The left internal mammary artery is atretic. It appears  to be totally occluded before reaches the  LAD. 6. The saphenous vein graft bypass to the diagonal artery is a widely patent and fairly healthy bypass. It is free of hemodynamically significant stenoses and provides excellent flow to the diagonal branch as well as some retrograde competitive flow to the main LAD artery. 7. The left ventricle is normal in size. The left ventricle systolic function is markedly hyperdynamic with an estimated ejection fraction of >80%. Regional wall motion abnormalities are not seen. No left ventricular thrombus is seen. There is very mild mitral insufficiency. The ascending aorta appears normal. There is no aortic valve stenosis by pullback. A 10 mm intracavitary gradient is seen between the apex and the LVOT.  The left ventricular end-diastolic pressure is 28 mm Hg.    IMPRESSIONS:  No significant coronary obstruction that might cause arterial insufficiency is seen. Although the left internal mammary artery is atretic there is excellent flow down the native LAD artery as well as competitive flow from the vein graft to the diagonal artery.  It is possible that the patient has subendocardial ischemia due to elevated interventricular end-diastolic pressure and a severely hyperdynamic pattern of contraction. Nitrates and other vasodilators should be avoided. Beta blockers will be the preferred antihypertensive but there uses limited by relative bradycardia. RECOMMENDATION:  Medical therapy.stop nitrates and avoid direct vasodilators. Prefer beta blockers and judicious use of diuretics.      Thurmon Fair, MD, [facility] [phone] 08/09/2011 4:04 PM

## 2011-08-09 NOTE — Progress Notes (Signed)
Pt SBP elevated in the 180's -190's.  Pt also has some BLE, R>L, edema present at this time.   Pt reports new onset of edema after IV fluids started.  MD has been made aware.  New orders given.  Will continue to monitor.   Nino Glow RN

## 2011-08-09 NOTE — Progress Notes (Signed)
Utilization review complete 

## 2011-08-09 NOTE — Procedures (Signed)
duplicate

## 2011-08-10 ENCOUNTER — Encounter (HOSPITAL_COMMUNITY): Payer: Self-pay | Admitting: Cardiology

## 2011-08-10 DIAGNOSIS — I5032 Chronic diastolic (congestive) heart failure: Secondary | ICD-10-CM

## 2011-08-10 DIAGNOSIS — I5030 Unspecified diastolic (congestive) heart failure: Secondary | ICD-10-CM

## 2011-08-10 HISTORY — DX: Unspecified diastolic (congestive) heart failure: I50.30

## 2011-08-10 LAB — BASIC METABOLIC PANEL
Chloride: 102 mEq/L (ref 96–112)
GFR calc Af Amer: >90 mL/min (ref >90)
GFR calc non Af Amer: >90 mL/min (ref >90)
Potassium: 4 mEq/L (ref 3.5–5.1)
Sodium: 140 mEq/L (ref 135–145)

## 2011-08-10 MED ORDER — AMLODIPINE BESYLATE 10 MG PO TABS: 10.0000 mg | ORAL_TABLET | Freq: Every day | ORAL | Status: DC

## 2011-08-10 MED ORDER — AMLODIPINE BESYLATE 5 MG PO TABS: 5.0000 mg | ORAL_TABLET | Freq: Every day | ORAL | Status: DC

## 2011-08-10 MED ORDER — NITROGLYCERIN 0.4 MG SL SUBL: 0.4000 mg | SUBLINGUAL_TABLET | SUBLINGUAL | Status: DC | PRN

## 2011-08-10 NOTE — Progress Notes (Signed)
Subjective: No complaints.  Objective: Vital signs in last 24 hours: Temp:  [98.4 F (36.9 C)-99 F (37.2 C)] 99 F (37.2 C) (02/26 0540) Pulse Rate:  [50-57] 57  (02/26 0540) Resp:  [18-20] 18  (02/26 0540) BP: (131-174)/(65-101) 142/101 mmHg (02/26 1002) SpO2:  [95 %-98 %] 97 % (02/26 0540) Weight:  [64.547 kg (142 lb 4.8 oz)] 64.547 kg (142 lb 4.8 oz) (02/26 0540) Weight change: -0.862 kg (-1 lb 14.4 oz) Last BM Date: 08/09/11 Intake/Output from previous day: 02/25 0701 - 02/26 0700 In: 245.7 [P.O.:240; I.V.:5.7] Out: 1001 [Urine:1000; Stool:1] Intake/Output this shift: Total I/O In: 1080 [P.O.:1080] Out: 802 [Urine:800; Stool:2]  PE: General:alert and oriented X 3, no complaints Heart:S1S2 RRR, non-displaced PMI Lungs: CTAB, non-labored Abd:+ BS, soft, non tender Ext:no edema, rt. Groin cath site without hematoma, 1+ pedal pulses; clubbing   Lab Results:  [facility] 08/09/11 0650  WBC 10.0  HGB 12.6  HCT 36.9  PLT 254   BMET  Basename 08/09/11 0650  NA 143  K 3.6  CL 109  CO2 25  GLUCOSE 98  BUN 11  CREATININE 0.46*  CALCIUM 9.4     Lab Results  Component Value Date   CHOL 187 08/06/2011   HDL 35* 08/06/2011   LDLCALC 121* 08/06/2011   TRIG 156* 08/06/2011   CHOLHDL 5.3 08/06/2011   No results found for this basename: HGBA1C     Lab Results  Component Value Date   TSH 0.089* 08/06/2011     Studies/Results: 2D ECHO:  Left ventricle: The cavity size was normal. There was moderate concentric hypertrophy. Systolic function was normal. The estimated ejection fraction was in the range of 60% to 65%. Wall motion was normal; there were no regional wall motion abnormalities. Doppler parameters are consistent with abnormal left ventricular relaxation (grade 1 diastolic dysfunction). Doppler parameters are consistent with elevated mean left atrial filling pressure. - Mitral valve: Calcified annulus   CARDIAC CATH: IMPRESSIONS:  No significant  coronary obstruction that might cause arterial insufficiency is seen. Although the left internal mammary artery is atretic there is excellent flow down the native LAD artery as well as competitive flow from the vein graft to the diagonal artery.  It is possible that the patient has subendocardial ischemia due to elevated interventricular end-diastolic pressure and a severely hyperdynamic pattern of contraction. Nitrates and other vasodilators should be avoided. Beta blockers will be the preferred antihypertensive but there uses limited by relative bradycardia.   RECOMMENDATION:  Medical therapy.stop nitrates and avoid direct vasodilators. Prefer beta blockers and judicious use of diuretics.    Medications: I have reviewed the patient's current medications.    Marland Kitchen amLODipine  10 mg Oral Daily  . aspirin  81 mg Oral Daily  . Chlorhexidine Gluconate Cloth  6 each Topical Daily  . diazepam  5 mg Oral On Call  . enalapril  20 mg Oral Daily  . fentaNYL      . heparin      . lidocaine      . midazolam      . mupirocin ointment  1 application Nasal BID  . nitroGLYCERIN      . sodium chloride  3 mL Intravenous Q12H  . timolol  1 drop Both Eyes BID  . DISCONTD: aspirin EC  325 mg Oral Daily  . DISCONTD: isosorbide mononitrate  60 mg Oral Daily   Assessment/Plan: Patient Active Problem List  Diagnoses  . Chest pain, negative MI, cath with atretic LIMA,  and excellent flow down the native LAD,patent grafts otherwise  . HTN (hypertension), accelerated on admit  . Tobacco abuse  . CAD (coronary artery disease), CABG 08/24/2001, off pump, LIMA to LAD, SVG to 1st diagonal. Ligation of LAD artery aneurysm.  Marland Kitchen Dyslipidemia, statin intol.  . Sinus bradycardia, beta blocker intol.  . Diastolic dysfunction  . Diastolic CHF, on admit treated.   PLAN: Beta blocker recommended, not yet added ? Add and if significant brady occurs then place pacemaker?.  vs D/C today? Hyperlipidemia but intolerant to  statins and zetia, ? Add welchol? TSH was 0.089, but freeT3 and free T4 were within normal.  Ambulate.   LOS: 5 days   INGOLD,LAURA R 08/10/2011, 1:04 PM  I have seen and examined the patient along with Nada Boozer, NP.  I have reviewed the chart, notes and new data.  I agree with Laura's note.  71 y/o woman with history of CAD / CABG admitted for CP, referred for cardiac cath -- seems to have hyperdynamic LV function.  Stable post cath, concern is baseline bradycardia & HTN.  CP thought to be potentially due to acute diastolic HF in setting of accelerated HTN +/- mild volume overload resulting in increased wall stress with endocardial ischemia.   Key new complaints: Notes that her HR does go up with ambulation, no dizziness or lightheadedness,no further CP or SOB Key examination changes: + clubbing,  Key new findings / data: Cr stable, H/H stable; with normal T3 & T4, she is euthyroid despite low TSH (had H/o gioter --> can f/u with Dr. Talmage Nap).   PLAN: She is stable for discharge today with f/u scheduled to see Dr. Mindi Junker. BP is an issue - had Norvasc increased yesterday, but will put back to 5 mg to avoid peripheral dilation.  May need to increase ACE-I dose.  Very reluctant to use BB (actually she called her Opthalmologist & is changing from Timolol to a different eye gtt. Will need to monitor her HR - it does increase to > 60 with walking around room --> I showed her how to monitor her pulse & her home BP monitor will also measure HR.  If consistently low, would probably consider OP monitor with thoughts on potential PPM placement, which would then allow for BB or Non-dihydropyridine CCB. Will avoid Nitrate (she does not do well with them anyway --severe HA). Continue ASA    Delancey Moraes W, M.D., M.S. [facility] [address] [place], [place]  [address]  [phone]  08/10/2011 2:01 PM

## 2011-08-10 NOTE — Discharge Instructions (Signed)
Call [facility] if any bleeding, swelling or drainage at cath site.  May shower, no tub baths for 48 hours for groin sticks.   Heart Healthy diet  No lifting for 5 days.  No driving (if you drive) for 3 days.  Ask your optholmologist about changing the timolol to another medication.

## 2011-08-10 NOTE — Consult Note (Signed)
Pt states she has cut down to 1 ppwk and is happy where she's at. Verbalizes understanding of risks with smoking. Referred to 1-800 quit now for f/u and support. Discussed oral fixation substitutes, second hand smoke and in home smoking policy. Reviewed and gave pt Written education/contact information.

## 2011-08-10 NOTE — Progress Notes (Signed)
Tele d/c'ed. IV d/c'ed. D/C instructions and medications discussed with pt and pt's daughter.  All questions answered. Pt states understanding. Pt escorted out with staff via wheelchair. Pt does not appear in any immediate distress. Pt d/c'ed to home with daughter.

## 2011-08-13 NOTE — Discharge Summary (Signed)
Physician Discharge Summary  Patient ID: Meredith Gutierrez MRN: [id number] DOB/AGE: 01/09/1941 71 y.o.  Admit date: 08/05/2011 Discharge date: 08/13/2011  Discharge Diagnoses:  Principal Problem:  *Chest pain, negative MI, cath with atretic LIMA, and excellent flow down the native LAD,patent grafts otherwise Active Problems:  HTN (hypertension), accelerated on admit  CAD (coronary artery disease), CABG 08/24/2001, off pump, LIMA to LAD, SVG to 1st diagonal. Ligation of LAD artery aneurysm.  Diastolic dysfunction  Diastolic CHF, on admit treated.  Tobacco abuse  Dyslipidemia, statin intol.  Sinus bradycardia, beta blocker intol.   Discharged Condition: good  Hospital Course:  71 year-old female with history of hypertension CAD status post CABG in 2003 presented to the ER because of ongoing chest pain. Patient has been experiencing chest pain last 3 days, prior to admit. The chest pain is in the anterior chest wall feels like a pressure and last for a few seconds but is associated with exertion and shortness of breath. Denies any associated nausea vomiting diaphoresis or dizziness. Yesterday when patient checked her blood pressure at home it was found to be very high and at the moment she called EMS. Cardiac enzymes and EKG does not show anything acute at this time. Chest x-ray shows mild congestion. Patient will be admitted for further management. Patient denies any abdominal pain fever chills cough or phlegm or any focal deficits.  Patient's last nuclear stress test was February of 2012 which was negative for ischemia. Ejection fraction cannot be calculated because of ectopy. No chest pains since her bypass surgery. She had did have one episode of syncope in December 2011 and had a concussion with mild memory changes.  Other history includes significant leg weakness from the study and statin therapy and these were DC'd with improving leg weakness. She also has significant tobacco abuse and no  intentions of stopping per her statement in September 2012. She also is noted to be beta blocker intolerance.  Cardiac enzymes were negative, patient was hypertensive on admission with mild diastolic heart failure which was treated and resolved. She also has a history of tobacco abuse and does not wish to stop smoking at this time.  Cardiac catheterization was completed on 08/09/2011, impressions and recommendations:   IMPRESSIONS:  No significant coronary obstruction that might cause arterial insufficiency is seen. Although the left internal mammary artery is atretic there is excellent flow down the native LAD artery as well as competitive flow from the vein graft to the diagonal artery.  It is possible that the patient has subendocardial ischemia due to elevated interventricular end-diastolic pressure and a severely hyperdynamic pattern of contraction. Nitrates and other vasodilators should be avoided. Beta blockers will be the preferred antihypertensive but there uses limited by relative bradycardia.  RECOMMENDATION:  Medical therapy.stop nitrates and avoid direct vasodilators. Prefer beta blockers and judicious use of diuretics.   By the next morning the patient was without complaints. She has been bradycardic at times in the low 50s she is on Timolol which she will discuss with her ophthalmologist concerning a different medication.    Her blood pressure has been somewhat labile she is on Norvasc 5 mg and we have been reluctant to use the beta blocker secondary to the bradycardia.  When she is up ambulating her heart rate does go up to greater than 60.  She was stable and ready for discharge home was seen by Dr. Herbie Baltimore.     Consults: None  Significant Diagnostic Studies:  Procedure: cardiac catheterization 08/09/2011  by Dr. Royann Shivers.  The left main coronary artery is free of significant atherosclerosis and bifurcates in the usual fashion into the left anterior descending artery and left  circumflex coronary artery.  2. The left anterior descending artery is a large ectatic vessel that reaches the apex and generates two major diagonal branches. There is evidence of diffuse luminal irregularities and mild calcification. No hemodynamically meaningful stenoses are seen. Competitive flow from the saphenous vein graft to the diagonal artery seen in the mid to distal LAD. There is no evidence of any competitive flow from a internal mammary artery bypass graft.  3. The left circumflex coronary artery is a small to medium-size vessel non- dominant vessel that generates 2 major oblique marginal arteries. There is evidence of diffuse luminal irregularities and minimal calcification. no hemodynamically meaningful stenoses are seen.  4. The right coronary artery is a large-size dominant vessel that generates a very large branching posterior lateral ventricular system as well as the PDA. There is evidence of significant diffuse luminal irregularities and mild calcification. no hemodynamically meaningful stenoses are seen, although scattered 30-40% lesions are seen throughout the entire course of the vessel. The worse stenosis appears to be a 40-50% stenosis in the midportion of the right coronary artery.  5. The left internal mammary artery is atretic. It appears to be totally occluded before reaches the LAD.  6. The saphenous vein graft bypass to the diagonal artery is a widely patent and fairly healthy bypass. It is free of hemodynamically significant stenoses and provides excellent flow to the diagonal branch as well as some retrograde competitive flow to the main LAD artery.  7. The left ventricle is normal in size. The left ventricle systolic function is markedly hyperdynamic with an estimated ejection fraction of >80%. Regional wall motion abnormalities are not seen. No left ventricular thrombus is seen. There is very mild mitral insufficiency. The ascending aorta appears normal. There is no aortic  valve stenosis by pullback. A 10 mm intracavitary gradient is seen between the apex and the LVOT. The left ventricular end-diastolic pressure is 28 mm Hg.  2-D echocardiogram done 08/06/2011: Left ventricle: The cavity size was normal. There was moderate concentric hypertrophy. Systolic function was normal. The estimated ejection fraction was in the range of 60% to 65%. Wall motion was normal; there were no regional wall motion abnormalities. Doppler parameters are consistent with abnormal left ventricular relaxation (grade 1 diastolic dysfunction). Doppler parameters are consistent with elevated mean left atrial filling pressure. - Mitral valve: Calcified annulus. Trivial regurgitation. - Tricuspid valve: Mild regurgitation  Laboratory data: sodium 140 potassium 4.0 chloride 102 CO2 28 BUN 12 creatinine 0.6 calcium 9.5 glucose 221 Hemoglobin 12.6 hematocrit 36.9 WBC 10.0 platelets 254 TSH 0.089 Free T4  1.15 Free T3   3.1 proBNP on admission 967, at discharge 829  Total cholesterol 187 triglycerides 156 HDL 35 LDL 121 intolerance to statins  Cardiac enzymes were negative  Two-view chest x-ray: 1. Borderline cardiomegaly and mild pulmonary vascular congestion.  2. Mild changes of COPD  EKG:  Sinus rhythm without acute changes.  Discharge Exam: Blood pressure 163/78, pulse 65, temperature 98.6 F (37 C), temperature source Oral, resp. rate 18, height 5\' 1"  (1.549 m), weight 64.547 kg (142 lb 4.8 oz), SpO2 98.00%.General:alert and oriented X 3, no complaints  Heart:S1S2 RRR, non-displaced PMI  Lungs: CTAB, non-labored  Abd:+ BS, soft, non tender  Ext:no edema, rt. Groin cath site without hematoma, 1+ pedal pulses; clubbing    Disposition: 01-Home  or Self Care   Medication List  As of 08/13/2011  7:16 PM   TAKE these medications         ALPRAZolam 0.25 MG dissolvable tablet   Commonly known as: NIRAVAM   Take 0.25 mg by mouth 2 (two) times daily. Takes every day per  patient      amLODipine 5 MG tablet   Commonly known as: NORVASC   Take 1 tablet (5 mg total) by mouth daily.      aspirin 81 MG chewable tablet   Chew 81 mg by mouth daily.      enalapril 20 MG tablet   Commonly known as: VASOTEC   Take 20 mg by mouth daily.      nitroGLYCERIN 0.4 MG SL tablet   Commonly known as: NITROSTAT   Place 1 tablet (0.4 mg total) under the tongue every 5 (five) minutes as needed for chest pain.      timolol 0.25 % ophthalmic solution   Commonly known as: TIMOPTIC   Place 1 drop into both eyes 2 (two) times daily.           Follow-up Information    Follow up with LITTLE, ALFRED B, MD. (the office will call you with date and time)    Contact information:   [address] [place] [address] [phone]         Discharge Instructions:Call [facility] if any bleeding, swelling or drainage at cath site.  May shower, no tub baths for 48 hours for groin sticks.   Heart Healthy diet  No lifting for 5 days.  No driving (if you drive) for 3 days.  Ask your optholmologist about changing the timolol to another medication.    SignedNada Boozer Gutierrez 08/13/2011, 7:16 PM  /c I saw & examined the patient on the day of discharge. I have reviewed the chart, notes and new data. I agree with Meredith Gutierrez's note.   71 y/o woman with history of CAD / CABG admitted for CP, referred for cardiac cath -- seems to have hyperdynamic LV function. Stable post cath, concern is baseline bradycardia & HTN. CP thought to be potentially due to acute diastolic HF in setting of accelerated HTN +/- mild volume overload resulting in increased wall stress with endocardial ischemia.  Key new complaints: Notes that her HR does go up with ambulation, no dizziness or lightheadedness,no further CP or SOB  Key examination changes: + clubbing,  Key new findings / data: Cr stable, H/H stable; with normal T3 & T4, she is euthyroid  despite low TSH (had H/o gioter --> can f/u with Dr. Talmage Nap).  PLAN:  She is stable for discharge today with f/u scheduled to see Dr. Mindi Junker.  BP is an issue - had Norvasc increased yesterday, but will put back to 5 mg to avoid peripheral dilation. May need to increase ACE-I dose.  Very reluctant to use BB (actually she called her Opthalmologist & is changing from Timolol to a different eye gtt.  Will need to monitor her HR - it does increase to > 60 with walking around room --> I showed her how to monitor her pulse & her home BP monitor will also measure HR. If consistently low, would probably consider OP monitor with thoughts on potential PPM placement, which would then allow for BB or Non-dihydropyridine CCB.  Will avoid Nitrate (she does not do well with them anyway --severe HA).  Continue ASA   Meredith Gutierrez  Meredith Gutierrez, M.D., M.S. [facility] [address] [place], [place]  [address]  [phone]  08/13/2011 8:19 PM

## 2011-09-06 DIAGNOSIS — H2640 Unspecified secondary cataract: Secondary | ICD-10-CM | POA: Insufficient documentation

## 2012-06-14 DIAGNOSIS — J45909 Unspecified asthma, uncomplicated: Secondary | ICD-10-CM | POA: Insufficient documentation

## 2012-09-27 ENCOUNTER — Other Ambulatory Visit (HOSPITAL_COMMUNITY): Payer: Self-pay | Admitting: Cardiovascular Disease

## 2012-09-27 DIAGNOSIS — I701 Atherosclerosis of renal artery: Secondary | ICD-10-CM

## 2012-09-27 DIAGNOSIS — I6523 Occlusion and stenosis of bilateral carotid arteries: Secondary | ICD-10-CM

## 2012-10-02 ENCOUNTER — Ambulatory Visit (HOSPITAL_COMMUNITY)
Admission: RE | Admit: 2012-10-02 | Discharge: 2012-10-02 | Disposition: A | Discharge status: Home / Self Care | Payer: Medicare Other | Source: Ambulatory Visit | Attending: Cardiovascular Disease | Admitting: Cardiovascular Disease

## 2012-10-02 DIAGNOSIS — I6523 Occlusion and stenosis of bilateral carotid arteries: Secondary | ICD-10-CM

## 2012-10-02 DIAGNOSIS — I6529 Occlusion and stenosis of unspecified carotid artery: Secondary | ICD-10-CM | POA: Insufficient documentation

## 2012-10-02 NOTE — Progress Notes (Signed)
Carotid artery duplex completed.  10/02/2012  Vermell Madrid, RDMS, RDCS  

## 2012-10-13 ENCOUNTER — Ambulatory Visit (HOSPITAL_COMMUNITY)
Admission: RE | Admit: 2012-10-13 | Discharge: 2012-10-13 | Disposition: A | Discharge status: Home / Self Care | Payer: Medicare Other | Source: Ambulatory Visit | Attending: Cardiovascular Disease | Admitting: Cardiovascular Disease

## 2012-10-13 DIAGNOSIS — I701 Atherosclerosis of renal artery: Secondary | ICD-10-CM | POA: Insufficient documentation

## 2012-10-13 NOTE — Progress Notes (Signed)
Renal artery duplex complete. GMG

## 2012-11-07 ENCOUNTER — Other Ambulatory Visit: Payer: Self-pay | Admitting: Cardiology

## 2012-11-09 NOTE — Telephone Encounter (Signed)
Refills sent to pharmacy. 

## 2013-02-02 ENCOUNTER — Encounter: Payer: Self-pay | Admitting: Gynecology

## 2013-02-02 ENCOUNTER — Ambulatory Visit: Payer: Medicare Other | Attending: Gynecology | Admitting: Gynecology

## 2013-02-02 VITALS — BP 136/80 | HR 70 | Temp 98.6°F | Resp 16 | Ht 61.38 in | Wt 141.3 lb

## 2013-02-02 DIAGNOSIS — M545 Low back pain, unspecified: Secondary | ICD-10-CM | POA: Insufficient documentation

## 2013-02-02 DIAGNOSIS — N839 Noninflammatory disorder of ovary, fallopian tube and broad ligament, unspecified: Secondary | ICD-10-CM | POA: Insufficient documentation

## 2013-02-02 DIAGNOSIS — Z951 Presence of aortocoronary bypass graft: Secondary | ICD-10-CM | POA: Insufficient documentation

## 2013-02-02 DIAGNOSIS — I10 Essential (primary) hypertension: Secondary | ICD-10-CM | POA: Insufficient documentation

## 2013-02-02 DIAGNOSIS — N838 Other noninflammatory disorders of ovary, fallopian tube and broad ligament: Secondary | ICD-10-CM

## 2013-02-02 DIAGNOSIS — N1330 Unspecified hydronephrosis: Secondary | ICD-10-CM | POA: Insufficient documentation

## 2013-02-02 DIAGNOSIS — Z79899 Other long term (current) drug therapy: Secondary | ICD-10-CM | POA: Insufficient documentation

## 2013-02-02 DIAGNOSIS — I2510 Atherosclerotic heart disease of native coronary artery without angina pectoris: Secondary | ICD-10-CM | POA: Insufficient documentation

## 2013-02-02 DIAGNOSIS — Z90710 Acquired absence of both cervix and uterus: Secondary | ICD-10-CM | POA: Insufficient documentation

## 2013-02-02 NOTE — Progress Notes (Signed)
Consult Note: Gyn-Onc   Meredith Gutierrez 72 y.o. female  Chief Complaint  Patient presents with  . Ovarian mass    New Consult    Assessment : Complex right adnexal mass.  Plan: The patient is scheduled to undergo exploratory laparotomy and resection of mass on 02/20/2013. Dr. [name] will be the attending surgeon. The extent surgery was discussed with the patient and her daughter. At a minimum bilateral salpingo-oophorectomy would be performed. Should this be an ovarian cancer then surgical staging and debulking surgery were also discussed. The risks of surgery including hemorrhage, infection, injury to adjacent viscera, and thromboembolic complications were discussed. We will obtain preoperative cardiac clearance given her cardiology history. The patient is informed that is unlikely that removing this mass will improve her back pain. All questions are answered.  HPI: Patient seen in consultation at the request of Dr. Alinda Deem regarding management of a newly diagnosed pelvic mass. Patient was undergoing evaluation for low back pain. An MRI was obtained showing a pelvic mass. Subsequently the patient's had an ultrasound and a CT scan all confirmed a pelvic mass. Ultrasound shows the mass measuring 10.6 x 8.7 x 10.4 cm. Most of it is cystic although there is some mural nodularity and irregularity with septations. CT scan shows no evidence of adenopathy or ascites. She does have a right hydronephrosis.  Patient's previously had a hysterectomy (abdominal through midline incision)  She has no other gynecologic history. She has no other GI or GU symptoms.  Review of Systems:10 point review of systems is negative except as noted in interval history.   Vitals: Blood pressure 136/80, pulse 70, temperature 98.6 F (37 C), temperature source Oral, resp. rate 16, height 5' 1.38" (1.559 m), weight 141 lb 4.8 oz (64.093 kg).  Physical Exam: General : The patient is a healthy woman in no  acute distress.  HEENT: normocephalic, extraoccular movements normal; neck is supple without thyromegally  Lynphnodes: Supraclavicular and inguinal nodes not enlarged  Abdomen: Soft, non-tender, no ascites, there is fullness in the right lower quadrant. no hernias  Pelvic:  EGBUS: Normal female  Vagina: Normal, no lesions  Urethra and Bladder: Normal, non-tender  Cervix: Surgically absent  Uterus: Surgically absent  Bi-manual examination: Cystic 10 cm mass in the right adnexa which is mobile slightly tender. There is no nodularity. Rectal: normal sphincter tone, no masses, no blood  Lower extremities: No edema or varicosities. Normal range of motion      Allergies  Allergen Reactions  . Codeine     Shortness of breath  . Crestor [Rosuvastatin Calcium]     Weakness with statins  . Lopressor [Metoprolol Tartrate]     Bradycardia   . Penicillins     Shortness of breath  . Prednisone     Shortness of breath  . Zetia [Ezetimibe]     Weakness     Past Medical History  Diagnosis Date  . Hypertension   . Coronary artery disease   . Anxiety   . Angina   . Diastolic dysfunction 08/10/2011  . Diastolic CHF, on admit treated. 08/10/2011    Past Surgical History  Procedure Laterality Date  . Coronary artery bypass graft    . Abdominal hysterectomy    . Appendectomy    . Cesarean section      Current Outpatient Prescriptions  Medication Sig Dispense Refill  . ALPRAZolam (NIRAVAM) 0.25 MG dissolvable tablet Take 0.25 mg by mouth 2 (two) times daily. Takes every day per patient      .  aspirin 81 MG chewable tablet Chew 81 mg by mouth daily.      Marland Kitchen diltiazem (CARDIZEM CD) 120 MG 24 hr capsule Take 1 capsule (120 mg total) by mouth daily.  30 capsule  6  . enalapril (VASOTEC) 20 MG tablet Take 20 mg by mouth daily.      . hydrALAZINE (APRESOLINE) 50 MG tablet Take 50 mg by mouth 2 (two) times daily.      Marland Kitchen loratadine (CLARITIN) 10 MG tablet Take 10 mg by mouth daily.      .  meloxicam (MOBIC) 7.5 MG tablet Take 7.5 mg by mouth daily.      Marland Kitchen amLODipine (NORVASC) 5 MG tablet Take 1 tablet (5 mg total) by mouth daily.  30 tablet  11  . nitroGLYCERIN (NITROSTAT) 0.4 MG SL tablet Place 1 tablet (0.4 mg total) under the tongue every 5 (five) minutes as needed for chest pain.  25 tablet  4  . timolol (TIMOPTIC) 0.25 % ophthalmic solution Place 1 drop into both eyes 2 (two) times daily.       No current facility-administered medications for this visit.    History   Social History  . Marital Status: Widowed    Spouse Name: N/A    Number of Children: N/A  . Years of Education: N/A   Occupational History  . Not on file.   Social History Main Topics  . Smoking status: Current Every Day Smoker -- 0.50 packs/day for 30 years    Types: Cigarettes  . Smokeless tobacco: Not on file     Comment: smoking cessation info given  . Alcohol Use: No  . Drug Use: No  . Sexual Activity: No   Other Topics Concern  . Not on file   Social History Narrative  . No narrative on file    Family History  Problem Relation Age of Onset  . Stroke Mother   . Hypotension Mother   . Coronary artery disease Father   . Hypotension Father   . Breast cancer Maternal Aunt   . Hypotension Sister   . Hypotension Brother       Jeannette Corpus, MD 02/02/2013, 10:51 AM

## 2013-02-02 NOTE — Patient Instructions (Signed)
Surgery scheduled for 02/20/2013.Dr. [name] will be your surgeon.  We'll arrange preoperative evaluation and cardiac clearance.

## 2013-02-06 ENCOUNTER — Encounter: Payer: Self-pay | Admitting: *Deleted

## 2013-02-07 ENCOUNTER — Encounter: Payer: Self-pay | Admitting: Cardiovascular Disease

## 2013-02-07 ENCOUNTER — Ambulatory Visit (INDEPENDENT_AMBULATORY_CARE_PROVIDER_SITE_OTHER): Payer: Medicare Other | Admitting: Cardiovascular Disease

## 2013-02-07 VITALS — BP 142/90 | HR 72 | Ht 61.0 in | Wt 141.8 lb

## 2013-02-07 DIAGNOSIS — Z01810 Encounter for preprocedural cardiovascular examination: Secondary | ICD-10-CM

## 2013-02-07 DIAGNOSIS — I10 Essential (primary) hypertension: Secondary | ICD-10-CM

## 2013-02-07 DIAGNOSIS — I701 Atherosclerosis of renal artery: Secondary | ICD-10-CM

## 2013-02-07 DIAGNOSIS — Z79899 Other long term (current) drug therapy: Secondary | ICD-10-CM

## 2013-02-07 DIAGNOSIS — R5381 Other malaise: Secondary | ICD-10-CM

## 2013-02-07 DIAGNOSIS — Z720 Tobacco use: Secondary | ICD-10-CM

## 2013-02-07 DIAGNOSIS — E785 Hyperlipidemia, unspecified: Secondary | ICD-10-CM

## 2013-02-07 DIAGNOSIS — I2510 Atherosclerotic heart disease of native coronary artery without angina pectoris: Secondary | ICD-10-CM

## 2013-02-07 DIAGNOSIS — F17200 Nicotine dependence, unspecified, uncomplicated: Secondary | ICD-10-CM

## 2013-02-07 DIAGNOSIS — I25810 Atherosclerosis of coronary artery bypass graft(s) without angina pectoris: Secondary | ICD-10-CM

## 2013-02-07 NOTE — Patient Instructions (Addendum)
Your physician recommends that you return for lab work prior to your procedure.  Your physician has requested that you have a renal artery duplex. During this test, an ultrasound is used to evaluate blood flow to the kidneys. Allow one hour for this exam. Do not eat after midnight the day before and avoid carbonated beverages. Take your medications as you usually do.  Your physician recommends that you schedule a follow-up appointment in: 1 year

## 2013-02-08 ENCOUNTER — Other Ambulatory Visit: Payer: Self-pay | Admitting: Cardiovascular Disease

## 2013-02-08 ENCOUNTER — Telehealth: Payer: Self-pay | Admitting: *Deleted

## 2013-02-08 LAB — COMPREHENSIVE METABOLIC PANEL
ALT: 14 IU/L (ref 0–32)
AST: 15 IU/L (ref 0–40)
Alkaline Phosphatase: 92 IU/L (ref 39–117)
Chloride: 104 mmol/L (ref 96–108)
GFR calc Af Amer: 106 mL/min/{1.73_m2} (ref >59)
Glucose: 92 mg/dL (ref 65–99)
Potassium: 4.2 mmol/L (ref 3.5–5.2)
Sodium: 139 mmol/L (ref 134–144)
Total Bilirubin: 0.3 mg/dL (ref 0.0–1.2)
Total Protein: 6.9 g/dL (ref 6.0–8.5)

## 2013-02-08 LAB — CBC WITH DIFFERENTIAL/PLATELET
Basophils Absolute: 0 10*3/uL (ref 0.0–0.2)
Eosinophils Absolute: 0.1 10*3/uL (ref 0.0–0.4)
Lymphs: 17 % (ref 14–46)
MCH: 31.2 pg (ref 26.6–33.0)
MCHC: 35 g/dL (ref 31.5–35.7)
Neutrophils Absolute: 8.6 10*3/uL — ABNORMAL HIGH (ref 1.4–7.0)
Neutrophils Relative %: 73 % (ref 40–74)

## 2013-02-08 NOTE — Telephone Encounter (Signed)
Labs OK. Minimal abnormalities

## 2013-02-08 NOTE — Telephone Encounter (Signed)
Forwarded to B. Lassiter, CMA to notify pt once all labs reviewed.

## 2013-02-08 NOTE — Telephone Encounter (Signed)
Meredith Gutierrez w/ [facility] stated she has a stat lab and will fax result.   Fax received and on Dr. Erin Hearing desk for review.

## 2013-02-09 ENCOUNTER — Encounter: Payer: Self-pay | Admitting: Cardiovascular Disease

## 2013-02-09 DIAGNOSIS — Z01810 Encounter for preprocedural cardiovascular examination: Secondary | ICD-10-CM | POA: Insufficient documentation

## 2013-02-09 NOTE — Assessment & Plan Note (Signed)
She has mildly elevated LDL cholesterol. While it will be highly desirable to treat this to prevent coronary disease progression, she has been intolerant to multiple lipid-lowering agents in the past.

## 2013-02-09 NOTE — Assessment & Plan Note (Signed)
Low risk for major cardiovascular complications of the planned gynecological procedure. In the past we have tried to treat with beta blockers but this caused symptomatic bradycardia. No further cardiac evaluation or change in therapy is recommended before her surgery.

## 2013-02-09 NOTE — Progress Notes (Signed)
Patient ID: Meredith Gutierrez, female   DOB: 1941-04-05, 72 y.o.   MRN: [id number]     Reason for office visit Coronary artery disease followup; preoperative cardiovascular examination  Meredith Gutierrez has been discovered to have a large (hopefully benign) a right ovarian tumor and is scheduled to undergo surgery by Dr. Stanford Breed. She is here for preoperative evaluation. She had bypass surgery in 2003 (LIMA to LAD and SVG to first diagonal artery). 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 She has not had serious cardiac event since that time. Cardiac catheterization and coronary angiography was performed in 2013. This showed that the LIMA bypass was atretic, but there was excellent flow down both the native LAD artery and the SVG to the diagonal artery. No new coronary lesions were noted. She has been chest pain-free since that time. She has hyperdynamic left ventricular systolic function. She has never had congestive heart failure.  She remains free of chest pain and dyspnea this time. All in all she feels quite well and is optimistic that her ovarian problem will be quickly resolved with surgery.  Unfortunately she is intolerant to multiple statins that have been repeatedly tried in the past.     Allergies  Allergen Reactions  . Codeine     Shortness of breath  . Crestor [Rosuvastatin Calcium]     Weakness with statins  . Lopressor [Metoprolol Tartrate]     Bradycardia   . Penicillins     Shortness of breath  . Prednisone     Shortness of breath  . Zetia [Ezetimibe]     Weakness     Current Outpatient Prescriptions  Medication Sig Dispense Refill  . ALPRAZolam (NIRAVAM) 0.25 MG dissolvable tablet Take 0.25 mg by mouth 2 (two) times daily. Takes every day per patient      . ammonium lactate (LAC-HYDRIN) 12 % lotion Apply 1 application topically as needed.      Marland Kitchen aspirin 81 MG chewable tablet Chew 81 mg by mouth daily.      Marland Kitchen diltiazem (CARDIZEM CD) 120 MG  24 hr capsule Take 1 capsule (120 mg total) by mouth daily.  30 capsule  6  . enalapril (VASOTEC) 10 MG tablet Take 10 mg by mouth daily.      . hydrALAZINE (APRESOLINE) 50 MG tablet Take 50 mg by mouth 2 (two) times daily.      Marland Kitchen loratadine (CLARITIN) 10 MG tablet Take 10 mg by mouth daily.      . meloxicam (MOBIC) 7.5 MG tablet Take 7.5 mg by mouth daily.      . nitroGLYCERIN (NITROSTAT) 0.4 MG SL tablet Place 1 tablet (0.4 mg total) under the tongue every 5 (five) minutes as needed for chest pain.  25 tablet  4   No current facility-administered medications for this visit.    Past Medical History  Diagnosis Date  . Hypertension   . Coronary artery disease   . Anxiety   . Angina   . Diastolic dysfunction 08/10/2011  . Diastolic CHF, on admit treated. 08/10/2011  . COPD (chronic obstructive pulmonary disease)   . Cerebral atherosclerosis     Past Surgical History  Procedure Laterality Date  . Coronary artery bypass graft  2003  . Abdominal hysterectomy    . Appendectomy    . Cesarean section    . Cardiac catheterization  08/09/2011    no sign. coronary obstruction. LIMA is atretic w/excellent flow down the native LAD  .  Cardiac catheterization  08/22/2001    no sign. coronary stenosis,  . Nm myocar perf wall motion  07/20/2010    normal    Family History  Problem Relation Age of Onset  . Stroke Mother   . Hypotension Mother   . Coronary artery disease Father   . Hypotension Father   . Breast cancer Maternal Aunt   . Hypotension Sister   . Hypotension Brother     History   Social History  . Marital Status: Widowed    Spouse Name: N/A    Number of Children: N/A  . Years of Education: N/A   Occupational History  . Not on file.   Social History Main Topics  . Smoking status: Current Every Day Smoker -- 0.10 packs/day for 20 years    Types: Cigarettes  . Smokeless tobacco: Never Used     Comment: smoking cessation info given  . Alcohol Use: No  . Drug Use: No  .  Sexual Activity: No   Other Topics Concern  . Not on file   Social History Narrative  . No narrative on file    Review of systems: The patient specifically denies any chest pain at rest or with exertion, dyspnea at rest or with exertion, orthopnea, paroxysmal nocturnal dyspnea, syncope, palpitations, focal neurological deficits, intermittent claudication, lower extremity edema, unexplained weight gain, cough, hemoptysis or wheezing.  The patient also denies abdominal pain, nausea, vomiting, dysphagia, diarrhea, constipation, polyuria, polydipsia, dysuria, hematuria, frequency, urgency, abnormal bleeding or bruising, fever, chills, unexpected weight changes, mood swings, change in skin or hair texture, change in voice quality, auditory or visual problems, allergic reactions or rashes, new musculoskeletal complaints other than usual "aches and pains".   PHYSICAL EXAM BP 142/90  Pulse 72  Ht 5\' 1"  (1.549 m)  Wt 141 lb 12.8 oz (64.32 kg)  BMI 26.81 kg/m2  General: Alert, oriented x3, no distress Head: no evidence of trauma, PERRL, EOMI, no exophtalmos or lid lag, no myxedema, no xanthelasma; normal ears, nose and oropharynx Neck: normal jugular venous pulsations and no hepatojugular reflux; brisk carotid pulses without delay and no carotid bruits Chest: clear to auscultation, no signs of consolidation by percussion or palpation, normal fremitus, symmetrical and full respiratory excursions; old sternotomy scar Cardiovascular: normal position and quality of the apical impulse, regular rhythm, normal first and second heart sounds, no murmurs, rubs or gallops Abdomen: no tenderness or distention, no masses by palpation, no abnormal pulsatility or arterial bruits, normal bowel sounds, no hepatosplenomegaly Extremities: no clubbing, cyanosis or edema; 2+ radial, ulnar and brachial pulses bilaterally; 2+ right femoral, posterior tibial and dorsalis pedis pulses; 2+ left femoral, posterior tibial and  dorsalis pedis pulses; no subclavian or femoral bruits Neurological: grossly nonfocal   EKG: Normal sinus rhythm, no repolarization abnormalities, delayed anterior wall R wave progression unchanged from previous tracings  Lipid Panel     Component Value Date/Time   CHOL 187 08/06/2011 0046   TRIG 156* 08/06/2011 0046   HDL 35* 08/06/2011 0046   CHOLHDL 5.3 08/06/2011 0046   VLDL 31 08/06/2011 0046   LDLCALC 121* 08/06/2011 0046    BMET    Component Value Date/Time   NA 139 02/08/2013 0901   NA 140 08/10/2011 1353   K 4.2 02/08/2013 0901   CL 104 02/08/2013 0901   CO2 25 02/08/2013 0901   GLUCOSE 92 02/08/2013 0901   GLUCOSE 221* 08/10/2011 1353   BUN 17 02/08/2013 0901   BUN 12 08/10/2011 1353  CREATININE 0.57 02/08/2013 0901   CALCIUM 10.0 02/08/2013 0901   GFRNONAA 92 02/08/2013 0901   GFRAA 106 02/08/2013 0901     ASSESSMENT AND PLAN CAD (coronary artery disease), CABG 08/24/2001, off pump, LIMA to LAD, SVG to 1st diagonal. Ligation of LAD artery aneurysm. Asymptomatic. Low risk anatomy by coronary angiography in 2013  HTN (hypertension), accelerated on admit Borderline control. She states that her blood pressure is much lower when she checks it at home.  Dyslipidemia, statin intol. She has mildly elevated LDL cholesterol. While it will be highly desirable to treat this to prevent coronary disease progression, she has been intolerant to multiple lipid-lowering agents in the past.  Tobacco abuse She is a very light cigarette smoker. She only smokes 2 cigarettes everyday in the morning with her 3 cups of coffee. She enjoys this and has no intention to quit whatsoever. When she visits her children or travels on vacation she does not smoke at all. I have encouraged her to quit smoking altogether, but she politely refuses.  Preop cardiovascular exam Low risk for major cardiovascular complications of the planned gynecological procedure. In the past we have tried to treat with beta  blockers but this caused symptomatic bradycardia. No further cardiac evaluation or change in therapy is recommended before her surgery.   Orders Placed This Encounter  Procedures  . Comp Met (CMET)  . INR/PT  . CBC w/Diff  . CBC w/Diff  . EKG 12-Lead  . Renal Artery Duplex Bilateral   Meds ordered this encounter  Medications  . ammonium lactate (LAC-HYDRIN) 12 % lotion    Sig: Apply 1 application topically as needed.  . enalapril (VASOTEC) 10 MG tablet    Sig: Take 10 mg by mouth daily.    Junious Silk, MD, [facility] [phone] office [phone] pager

## 2013-02-09 NOTE — Assessment & Plan Note (Signed)
Borderline control. She states that her blood pressure is much lower when she checks it at home.

## 2013-02-09 NOTE — Assessment & Plan Note (Signed)
She is a very light cigarette smoker. She only smokes 2 cigarettes everyday in the morning with her 3 cups of coffee. She enjoys this and has no intention to quit whatsoever. When she visits her children or travels on vacation she does not smoke at all. I have encouraged her to quit smoking altogether, but she politely refuses.

## 2013-02-09 NOTE — Assessment & Plan Note (Signed)
Asymptomatic. Low risk anatomy by coronary angiography in 2013

## 2013-02-13 ENCOUNTER — Encounter (HOSPITAL_COMMUNITY): Payer: Self-pay | Admitting: Pharmacy Technician

## 2013-02-16 ENCOUNTER — Encounter (HOSPITAL_COMMUNITY): Payer: Self-pay

## 2013-02-16 ENCOUNTER — Ambulatory Visit (HOSPITAL_COMMUNITY)
Admission: RE | Admit: 2013-02-16 | Discharge: 2013-02-16 | Disposition: A | Discharge status: Home / Self Care | Payer: Medicare Other | Source: Ambulatory Visit | Attending: Gynecologic Oncology | Admitting: Gynecologic Oncology

## 2013-02-16 ENCOUNTER — Encounter (HOSPITAL_COMMUNITY)
Admission: RE | Admit: 2013-02-16 | Discharge: 2013-02-16 | Disposition: A | Discharge status: Home / Self Care | Payer: Medicare Other | Source: Ambulatory Visit | Attending: Gynecologic Oncology | Admitting: Gynecologic Oncology

## 2013-02-16 DIAGNOSIS — Z951 Presence of aortocoronary bypass graft: Secondary | ICD-10-CM | POA: Insufficient documentation

## 2013-02-16 DIAGNOSIS — Z01818 Encounter for other preprocedural examination: Secondary | ICD-10-CM | POA: Insufficient documentation

## 2013-02-16 DIAGNOSIS — N839 Noninflammatory disorder of ovary, fallopian tube and broad ligament, unspecified: Secondary | ICD-10-CM | POA: Insufficient documentation

## 2013-02-16 DIAGNOSIS — Z87442 Personal history of urinary calculi: Secondary | ICD-10-CM

## 2013-02-16 DIAGNOSIS — I2510 Atherosclerotic heart disease of native coronary artery without angina pectoris: Secondary | ICD-10-CM | POA: Insufficient documentation

## 2013-02-16 DIAGNOSIS — Z01812 Encounter for preprocedural laboratory examination: Secondary | ICD-10-CM | POA: Insufficient documentation

## 2013-02-16 HISTORY — DX: Personal history of urinary calculi: Z87.442

## 2013-02-16 HISTORY — PX: CORONARY ARTERY BYPASS GRAFT: SHX141

## 2013-02-16 LAB — CBC WITH DIFFERENTIAL/PLATELET
Basophils Absolute: 0 10*3/uL (ref 0.0–0.1)
Basophils Relative: 0 % (ref 0–1)
Eosinophils Absolute: 0.1 10*3/uL (ref 0.0–0.7)
HCT: 41.4 % (ref 36.0–46.0)
MCH: 30.1 pg (ref 26.0–34.0)
MCHC: 33.1 g/dL (ref 30.0–36.0)
Monocytes Absolute: 0.7 10*3/uL (ref 0.1–1.0)
Neutro Abs: 7.8 10*3/uL — ABNORMAL HIGH (ref 1.7–7.7)
RDW: 13.4 % (ref 11.5–15.5)

## 2013-02-16 LAB — COMPREHENSIVE METABOLIC PANEL
AST: 16 U/L (ref 0–37)
Albumin: 3.7 g/dL (ref 3.5–5.2)
BUN: 15 mg/dL (ref 6–23)
Calcium: 9.7 mg/dL (ref 8.4–10.5)
Chloride: 104 mEq/L (ref 96–112)
Creatinine, Ser: 0.5 mg/dL (ref 0.50–1.10)
Total Protein: 7 g/dL (ref 6.0–8.3)

## 2013-02-16 LAB — ABO/RH: ABO/RH(D): O POS

## 2013-02-16 NOTE — Patient Instructions (Addendum)
20 Madelynn A Distel  02/16/2013   Your procedure is scheduled on:   02-20-2013  Report to [facility] at      0800  AM .  Call this number if you have problems the morning of surgery: [phone]  Or Presurgical Testing [phone]([place])   Remember: Follow any bowel prep instructions per MD office.    Do not eat food:After Midnight 02-18-13 , Monday follow Clear Liquid diet only x 24 hour preop-see list given..  May have clear liquids:up to 6 Hours before arrival. Nothing after : 0400 AM  Clear liquids include soda, tea, black coffee, apple or grape juice, broth-see list..  Take these medicines the morning of surgery with A SIP OF WATER: Diltiazem.Tylenol.  May take Alprazolam , Loratadine. Stop Aspirin x5 days prior. Stop Meloxicam x3 days prior. Do not take Enalapril or Apresoline Am of.    Do not wear jewelry, make-up or nail polish.  Do not wear lotions, powders, or perfumes. You may wear deodorant.  Do not shave 12 hours prior to first CHG shower(legs and under arms).(face and neck okay.)  Do not bring valuables to the hospital.  Contacts, dentures or bridgework,body piercing,  may not be worn into surgery.  Leave suitcase in the car. After surgery it may be brought to your room.  For patients admitted to the hospital, checkout time is 11:00 AM the day of discharge.   Patients discharged the day of surgery will not be allowed to drive home. Must have responsible person with you x 24 hours once discharged.  Name and phone number of your driver: Philis Nettle, daughter- [phone] cell  Special Instructions: CHG(Chlorhedine 4%-"Hibiclens","Betasept","Aplicare") Shower Use Special Wash: see special instructions.(avoid face and genitals)   Please read over the following fact sheets that you were given: MRSA Information, Blood Transfusion fact sheet, Incentive Spirometry Instruction.    Failure to follow these instructions may result in Cancellation of your  surgery.   Patient signature_______________________________________________________

## 2013-02-16 NOTE — Pre-Procedure Instructions (Signed)
02-16-13 EKG 8'14-Epic. CXR done today.

## 2013-02-19 ENCOUNTER — Telehealth: Payer: Self-pay | Admitting: Gynecologic Oncology

## 2013-02-19 NOTE — Telephone Encounter (Signed)
Message left asking the patient to call the office with an update on pre-surgical status.

## 2013-02-20 ENCOUNTER — Encounter (HOSPITAL_COMMUNITY): Payer: Self-pay | Admitting: *Deleted

## 2013-02-20 ENCOUNTER — Inpatient Hospital Stay (HOSPITAL_COMMUNITY): Payer: Medicare Other | Admitting: Registered Nurse

## 2013-02-20 ENCOUNTER — Encounter (HOSPITAL_COMMUNITY)
Admission: RE | Disposition: A | Discharge status: Home / Self Care | Payer: Self-pay | Source: Ambulatory Visit | Attending: Obstetrics & Gynecology

## 2013-02-20 ENCOUNTER — Encounter (HOSPITAL_COMMUNITY): Payer: Self-pay | Admitting: Registered Nurse

## 2013-02-20 ENCOUNTER — Inpatient Hospital Stay (HOSPITAL_COMMUNITY)
Admission: RE | Admit: 2013-02-20 | Discharge: 2013-02-25 | DRG: 742 | Disposition: A | Discharge status: Home / Self Care | Payer: Medicare Other | Source: Ambulatory Visit | Attending: Obstetrics & Gynecology | Admitting: Obstetrics & Gynecology

## 2013-02-20 DIAGNOSIS — I509 Heart failure, unspecified: Secondary | ICD-10-CM | POA: Diagnosis present

## 2013-02-20 DIAGNOSIS — N838 Other noninflammatory disorders of ovary, fallopian tube and broad ligament: Secondary | ICD-10-CM

## 2013-02-20 DIAGNOSIS — F411 Generalized anxiety disorder: Secondary | ICD-10-CM | POA: Diagnosis present

## 2013-02-20 DIAGNOSIS — Z90722 Acquired absence of ovaries, bilateral: Secondary | ICD-10-CM

## 2013-02-20 DIAGNOSIS — R110 Nausea: Secondary | ICD-10-CM | POA: Diagnosis not present

## 2013-02-20 DIAGNOSIS — D279 Benign neoplasm of unspecified ovary: Secondary | ICD-10-CM

## 2013-02-20 DIAGNOSIS — Z7982 Long term (current) use of aspirin: Secondary | ICD-10-CM

## 2013-02-20 DIAGNOSIS — J4489 Other specified chronic obstructive pulmonary disease: Secondary | ICD-10-CM | POA: Diagnosis present

## 2013-02-20 DIAGNOSIS — I10 Essential (primary) hypertension: Secondary | ICD-10-CM | POA: Diagnosis present

## 2013-02-20 DIAGNOSIS — I2510 Atherosclerotic heart disease of native coronary artery without angina pectoris: Secondary | ICD-10-CM | POA: Diagnosis present

## 2013-02-20 DIAGNOSIS — Z90710 Acquired absence of both cervix and uterus: Secondary | ICD-10-CM

## 2013-02-20 DIAGNOSIS — Z79899 Other long term (current) drug therapy: Secondary | ICD-10-CM

## 2013-02-20 DIAGNOSIS — J449 Chronic obstructive pulmonary disease, unspecified: Secondary | ICD-10-CM | POA: Diagnosis present

## 2013-02-20 DIAGNOSIS — I5032 Chronic diastolic (congestive) heart failure: Secondary | ICD-10-CM | POA: Diagnosis present

## 2013-02-20 DIAGNOSIS — Z951 Presence of aortocoronary bypass graft: Secondary | ICD-10-CM

## 2013-02-20 DIAGNOSIS — N1330 Unspecified hydronephrosis: Secondary | ICD-10-CM | POA: Diagnosis present

## 2013-02-20 DIAGNOSIS — F17200 Nicotine dependence, unspecified, uncomplicated: Secondary | ICD-10-CM | POA: Diagnosis present

## 2013-02-20 HISTORY — PX: LAPAROTOMY: SHX154

## 2013-02-20 HISTORY — PX: SALPINGOOPHORECTOMY: SHX82

## 2013-02-20 LAB — TYPE AND SCREEN
ABO/RH(D): O POS
ABO/RH(D): O POS
Antibody Screen: NEGATIVE
Antibody Screen: NEGATIVE

## 2013-02-20 SURGERY — LAPAROTOMY, EXPLORATORY
Anesthesia: General | Wound class: Clean

## 2013-02-20 MED ORDER — ZOLPIDEM TARTRATE 5 MG PO TABS: 5.0000 mg | ORAL_TABLET | Freq: Every evening | ORAL | Status: DC | PRN

## 2013-02-20 MED ORDER — HYDROMORPHONE HCL PF 1 MG/ML IJ SOLN
INTRAMUSCULAR | Status: DC | PRN
  Administered 2013-02-20: 1 mg via INTRAVENOUS

## 2013-02-20 MED ORDER — METRONIDAZOLE IN NACL 5-0.79 MG/ML-% IV SOLN
500.0000 mg | INTRAVENOUS | Status: AC
  Administered 2013-02-20: .5 g via INTRAVENOUS

## 2013-02-20 MED ORDER — ONDANSETRON HCL 4 MG/2ML IJ SOLN
INTRAMUSCULAR | Status: DC | PRN
  Administered 2013-02-20: 4 mg via INTRAVENOUS

## 2013-02-20 MED ORDER — BUPIVACAINE LIPOSOME 1.3 % IJ SUSP
INTRAMUSCULAR | Status: DC | PRN
  Administered 2013-02-20: 20 mL

## 2013-02-20 MED ORDER — ENSURE COMPLETE PO LIQD
237.0000 mL | Freq: Two times a day (BID) | ORAL | Status: DC
  Administered 2013-02-20: 237 mL via ORAL

## 2013-02-20 MED ORDER — FENTANYL CITRATE 0.05 MG/ML IJ SOLN
INTRAMUSCULAR | Status: DC | PRN
  Administered 2013-02-20: 50 ug via INTRAVENOUS

## 2013-02-20 MED ORDER — HYDROMORPHONE HCL PF 1 MG/ML IJ SOLN
0.5000 mg | INTRAMUSCULAR | Status: DC | PRN
  Administered 2013-02-20 (×2): 0.5 mg via INTRAVENOUS
  Filled 2013-02-20 (×2): qty 1

## 2013-02-20 MED ORDER — HYDRALAZINE HCL 50 MG PO TABS
50.0000 mg | ORAL_TABLET | Freq: Two times a day (BID) | ORAL | Status: DC
  Administered 2013-02-20 – 2013-02-25 (×10): 50 mg via ORAL
  Filled 2013-02-20 (×11): qty 1

## 2013-02-20 MED ORDER — SUFENTANIL CITRATE 50 MCG/ML IV SOLN
INTRAVENOUS | Status: DC | PRN
  Administered 2013-02-20: 10 ug via INTRAVENOUS
  Administered 2013-02-20: 40 ug via INTRAVENOUS

## 2013-02-20 MED ORDER — BUPIVACAINE LIPOSOME 1.3 % IJ SUSP
20.0000 mL | Freq: Once | INTRAMUSCULAR | Status: DC
  Filled 2013-02-20: qty 20

## 2013-02-20 MED ORDER — EPHEDRINE SULFATE 50 MG/ML IJ SOLN
INTRAMUSCULAR | Status: DC | PRN
  Administered 2013-02-20 (×2): 5 mg via INTRAVENOUS

## 2013-02-20 MED ORDER — CIPROFLOXACIN IN D5W 400 MG/200ML IV SOLN
INTRAVENOUS | Status: AC
  Filled 2013-02-20: qty 200

## 2013-02-20 MED ORDER — CIPROFLOXACIN IN D5W 400 MG/200ML IV SOLN
400.0000 mg | INTRAVENOUS | Status: AC
Start: 2013-02-20 — End: 2013-02-20
  Administered 2013-02-20: 400 mg via INTRAVENOUS

## 2013-02-20 MED ORDER — LACTATED RINGERS IV SOLN
INTRAVENOUS | Status: DC
  Administered 2013-02-20: 12:00:00 via INTRAVENOUS
  Administered 2013-02-20: 1000 mL via INTRAVENOUS

## 2013-02-20 MED ORDER — PROPOFOL 10 MG/ML IV BOLUS
INTRAVENOUS | Status: DC | PRN
  Administered 2013-02-20: 50 mg via INTRAVENOUS
  Administered 2013-02-20: 120 mg via INTRAVENOUS
  Administered 2013-02-20: 50 mg via INTRAVENOUS

## 2013-02-20 MED ORDER — 0.9 % SODIUM CHLORIDE (POUR BTL) OPTIME
TOPICAL | Status: DC | PRN
  Administered 2013-02-20: 2000 mL

## 2013-02-20 MED ORDER — SUCCINYLCHOLINE CHLORIDE 20 MG/ML IJ SOLN
INTRAMUSCULAR | Status: DC | PRN
  Administered 2013-02-20: 100 mg via INTRAVENOUS

## 2013-02-20 MED ORDER — OXYCODONE HCL 5 MG PO TABS: 5.0000 mg | ORAL_TABLET | ORAL | Status: DC | PRN

## 2013-02-20 MED ORDER — ASPIRIN EC 81 MG PO TBEC
81.0000 mg | DELAYED_RELEASE_TABLET | Freq: Every day | ORAL | Status: DC
  Administered 2013-02-20 – 2013-02-25 (×6): 81 mg via ORAL
  Filled 2013-02-20 (×6): qty 1

## 2013-02-20 MED ORDER — GLYCOPYRROLATE 0.2 MG/ML IJ SOLN
INTRAMUSCULAR | Status: DC | PRN
  Administered 2013-02-20: .4 mg via INTRAVENOUS

## 2013-02-20 MED ORDER — NEOSTIGMINE METHYLSULFATE 1 MG/ML IJ SOLN
INTRAMUSCULAR | Status: DC | PRN
  Administered 2013-02-20: 3 mg via INTRAVENOUS

## 2013-02-20 MED ORDER — MAGNESIUM HYDROXIDE 400 MG/5ML PO SUSP
30.0000 mL | Freq: Three times a day (TID) | ORAL | Status: AC
  Administered 2013-02-20 – 2013-02-21 (×3): 30 mL via ORAL
  Filled 2013-02-20 (×3): qty 30

## 2013-02-20 MED ORDER — ENALAPRIL MALEATE 10 MG PO TABS
10.0000 mg | ORAL_TABLET | Freq: Every day | ORAL | Status: DC
  Administered 2013-02-20 – 2013-02-25 (×6): 10 mg via ORAL
  Filled 2013-02-20 (×6): qty 1

## 2013-02-20 MED ORDER — LACTATED RINGERS IV SOLN: INTRAVENOUS | Status: DC

## 2013-02-20 MED ORDER — ONDANSETRON HCL 4 MG PO TABS
4.0000 mg | ORAL_TABLET | Freq: Four times a day (QID) | ORAL | Status: DC | PRN
  Administered 2013-02-22: 4 mg via ORAL
  Filled 2013-02-20: qty 1

## 2013-02-20 MED ORDER — DILTIAZEM HCL ER COATED BEADS 120 MG PO CP24
120.0000 mg | ORAL_CAPSULE | Freq: Every day | ORAL | Status: DC
  Administered 2013-02-21 – 2013-02-25 (×5): 120 mg via ORAL
  Filled 2013-02-20 (×5): qty 1

## 2013-02-20 MED ORDER — SODIUM CHLORIDE 0.9 % IJ SOLN
INTRAMUSCULAR | Status: AC
  Filled 2013-02-20: qty 20

## 2013-02-20 MED ORDER — KCL IN DEXTROSE-NACL 20-5-0.45 MEQ/L-%-% IV SOLN
INTRAVENOUS | Status: DC
  Administered 2013-02-20: 15:00:00 via INTRAVENOUS
  Filled 2013-02-20 (×2): qty 1000

## 2013-02-20 MED ORDER — METRONIDAZOLE IN NACL 5-0.79 MG/ML-% IV SOLN
INTRAVENOUS | Status: AC
  Filled 2013-02-20: qty 100

## 2013-02-20 MED ORDER — PROMETHAZINE HCL 25 MG/ML IJ SOLN: 6.2500 mg | INTRAMUSCULAR | Status: DC | PRN

## 2013-02-20 MED ORDER — ACETAMINOPHEN 500 MG PO TABS
1000.0000 mg | ORAL_TABLET | Freq: Four times a day (QID) | ORAL | Status: DC
  Administered 2013-02-20 – 2013-02-25 (×12): 1000 mg via ORAL
  Filled 2013-02-20 (×25): qty 2

## 2013-02-20 MED ORDER — ALPRAZOLAM 0.25 MG PO TABS
0.2500 mg | ORAL_TABLET | Freq: Two times a day (BID) | ORAL | Status: DC
  Administered 2013-02-20 – 2013-02-25 (×10): 0.25 mg via ORAL
  Filled 2013-02-20 (×10): qty 1

## 2013-02-20 MED ORDER — TRAMADOL HCL 50 MG PO TABS
100.0000 mg | ORAL_TABLET | Freq: Four times a day (QID) | ORAL | Status: DC
  Administered 2013-02-20 – 2013-02-25 (×13): 100 mg via ORAL
  Filled 2013-02-20 (×14): qty 2

## 2013-02-20 MED ORDER — MIDAZOLAM HCL 5 MG/5ML IJ SOLN
INTRAMUSCULAR | Status: DC | PRN
  Administered 2013-02-20: 2 mg via INTRAVENOUS

## 2013-02-20 MED ORDER — ALPRAZOLAM 0.25 MG PO TBDP: 0.2500 mg | ORAL_TABLET | Freq: Two times a day (BID) | ORAL | Status: DC

## 2013-02-20 MED ORDER — ONDANSETRON HCL 4 MG/2ML IJ SOLN
4.0000 mg | Freq: Four times a day (QID) | INTRAMUSCULAR | Status: DC | PRN
  Filled 2013-02-20: qty 2

## 2013-02-20 MED ORDER — ROCURONIUM BROMIDE 100 MG/10ML IV SOLN
INTRAVENOUS | Status: DC | PRN
  Administered 2013-02-20: 10 mg via INTRAVENOUS
  Administered 2013-02-20: 20 mg via INTRAVENOUS

## 2013-02-20 MED ORDER — NITROGLYCERIN 0.4 MG SL SUBL: 0.4000 mg | SUBLINGUAL_TABLET | SUBLINGUAL | Status: DC | PRN

## 2013-02-20 MED ORDER — HYDROMORPHONE HCL PF 1 MG/ML IJ SOLN: 0.2500 mg | INTRAMUSCULAR | Status: DC | PRN

## 2013-02-20 MED ORDER — LABETALOL HCL 5 MG/ML IV SOLN
INTRAVENOUS | Status: DC | PRN
  Administered 2013-02-20 (×2): 5 mg via INTRAVENOUS

## 2013-02-20 MED ORDER — OXYCODONE-ACETAMINOPHEN 5-325 MG PO TABS: 1.0000 | ORAL_TABLET | ORAL | Status: DC | PRN

## 2013-02-20 SURGICAL SUPPLY — 41 items
ATTRACTOMAT 16X20 MAGNETIC DRP (DRAPES) ×3 IMPLANT
BAG URINE DRAINAGE (UROLOGICAL SUPPLIES) ×2 IMPLANT
BLADE EXTENDED COATED 6.5IN (ELECTRODE) ×1 IMPLANT
CANISTER SUCTION 2500CC (MISCELLANEOUS) ×3 IMPLANT
CLIP TI MEDIUM LARGE 6 (CLIP) ×18 IMPLANT
CLOTH BEACON ORANGE TIMEOUT ST (SAFETY) ×3 IMPLANT
CONT SPEC 4OZ CLIKSEAL STRL BL (MISCELLANEOUS) ×3 IMPLANT
CONT SPECI 4OZ STER CLIK (MISCELLANEOUS) ×3 IMPLANT
COVER SURGICAL LIGHT HANDLE (MISCELLANEOUS) ×3 IMPLANT
DRAPE UTILITY 15X26 (DRAPE) ×3 IMPLANT
DRAPE WARM FLUID 44X44 (DRAPE) ×3 IMPLANT
ELECT REM PT RETURN 9FT ADLT (ELECTROSURGICAL) ×3
ELECTRODE REM PT RTRN 9FT ADLT (ELECTROSURGICAL) ×2 IMPLANT
GAUZE SPONGE 4X4 16PLY XRAY LF (GAUZE/BANDAGES/DRESSINGS) ×3 IMPLANT
GLOVE BIOGEL M STRL SZ7.5 (GLOVE) ×15 IMPLANT
GLOVE SS BIOGEL STRL SZ 7 (GLOVE) IMPLANT
GLOVE SUPERSENSE BIOGEL SZ 7 (GLOVE) ×1
GOWN STRL REIN XL XLG (GOWN DISPOSABLE) ×3 IMPLANT
KIT BASIN OR (CUSTOM PROCEDURE TRAY) ×6 IMPLANT
NS IRRIG 1000ML POUR BTL (IV SOLUTION) ×10 IMPLANT
PACK GENERAL/GYN (CUSTOM PROCEDURE TRAY) ×3 IMPLANT
SHEET LAVH (DRAPES) ×3 IMPLANT
SPONGE GAUZE 4X4 12PLY (GAUZE/BANDAGES/DRESSINGS) ×3 IMPLANT
SPONGE LAP 18X18 X RAY DECT (DISPOSABLE) ×6 IMPLANT
SUT ETHILON 1 LR 30 (SUTURE) IMPLANT
SUT PDS AB 0 CTX 60 (SUTURE) ×6 IMPLANT
SUT SILK 2 0 (SUTURE) ×3
SUT SILK 2 0 30  PSL (SUTURE)
SUT SILK 2 0 30 PSL (SUTURE) IMPLANT
SUT SILK 2-0 18XBRD TIE 12 (SUTURE) ×2 IMPLANT
SUT VIC AB 0 CT1 36 (SUTURE) ×6 IMPLANT
SUT VIC AB 2-0 CT2 27 (SUTURE) IMPLANT
SUT VIC AB 2-0 SH 27 (SUTURE) ×3
SUT VIC AB 2-0 SH 27X BRD (SUTURE) ×4 IMPLANT
SUT VIC AB 3-0 CTX 36 (SUTURE) IMPLANT
SUT VIC AB 4-0 PS1 27 (SUTURE) ×6 IMPLANT
SUT VICRYL 0 TIES 12 18 (SUTURE) ×3 IMPLANT
TAPE CLOTH SURG 4X10 WHT LF (GAUZE/BANDAGES/DRESSINGS) ×1 IMPLANT
TOWEL OR NON WOVEN STRL DISP B (DISPOSABLE) ×3 IMPLANT
TRAY FOLEY CATH 14FRSI W/METER (CATHETERS) ×3 IMPLANT
WATER STERILE IRR 1500ML POUR (IV SOLUTION) ×3 IMPLANT

## 2013-02-20 NOTE — Interval H&P Note (Signed)
History and Physical Interval Note:  02/20/2013 10:50 AM  Meredith Gutierrez  has presented today for surgery, with the diagnosis of OVARIAN MASS  The various methods of treatment have been discussed with the patient and family. After consideration of risks, benefits and other options for treatment, the patient has consented to  Procedure(s): EXPLORATORY LAPAROTOMY (N/A) BILATERAL SALPINGO OOPHORECTOMY, POSSIBLE STAGING AND DEBULKING (Bilateral) as a surgical intervention .  The patient's history has been reviewed, patient examined, no change in status, stable for surgery.  I have reviewed the patient's chart and labs.  Questions were answered to the patient's satisfaction.     [place], [facility]

## 2013-02-20 NOTE — Progress Notes (Signed)
Patient removed Blood Bracelet. Type and Screen redrawn.

## 2013-02-20 NOTE — Op Note (Signed)
Preoperative Diagnosis: 1. Right adnexal mass.  Postoperative Diagnosis: Right adnexal mass consistent with  cystadenofibroma the ovary on frozen section.   Procedure(s) Performed: 1. Exploratory laparotomy with bilateral salpingo-oophorectomy,  Surgeon: Maryclare Labrador.  Nelly Rout, MD., PhD  Assistant Surgeon: Antionette Char MD  Specimens: Bilateral tubes / ovaries,   Estimated Blood Loss: . Blood Replacement: none  Complications: None.   Operative Findings: A right 13cm  ovarian mass, adherent to the ureter. No evidence of intra-abdominal pelvic or peritoneal metastases no intra-peritoneal rupture occurred;   Frozen pathology was consistent with right ovarian cyst adenofibroma.  Left tube and ovary normal in appearance;    Procedure:   The patient was seen in the Holding Room. The risks, benefits, complications, treatment options, and expected outcomes were discussed with the patient.  The patient concurred with the proposed plan, giving informed consent.   The patient was  identified as Meredith Gutierrez   and the procedure verified as BSO with possible staging. A Time Out was held and the above information confirmed upon entry to the operating room..  After induction of anesthesia, the patient was draped and prepped in the usual sterile manner.  She was prepped and draped in the normal sterile fashion in the dorsal lithotomy position in padded Allen stirrups with good attention paid to support of the lower back and lower extremities. Position was adjusted for appropriate support. A Foley catheter was placed to gravity.   A midline vertical incision was made and the prior scar was excised and the incision carried through the subcutaneous tissue to the fascia. The fascial incision was made and extended superiorally. The rectus muscles were separated. The peritoneum was identified and entered. Peritoneal incision was extended longitudinally.  The abdominal cavity was entered sharply and  without incident. A Bookwalter retractor was then placed. A survey of the abdomen and pelvis revealed the above findings, which were significant for a large right sided mass displacing adherent to the right ureter.  The left tube and ovary were grossly normal in appearance.  Washings were collected.   After packing the small bowel into the upper abdomen, we began the procedure by entering the  pelvic sidewall just posterior to the right ound ligament. The pararectal space was developed and the retroperitoneum developed up to the level of the common iliac artery.  The course of the ureter was identified with ease.   Kelly clamps were placed on both uterine cornua. The round ligaments were identified and transected with the bovie. The anterior peritoneal reflection was incised and the bladder was dissected off the lower uterine segment. The retroperitoneal space was explored and the ureters were identified bilaterally.  The right ureter was identified in the retroperitoneum.  The right  IP was clamped transected and ligated.  The filmy adhesion that adhered the mass to the ureter were sharply transected.  The entire right tube and ovary were then sent to pathology.  The left ureter was identified.  The left  IP was then skeletonized, clamped, cut and suture ligated with 0 Vicryl x 2.   The pelvis was copiously irrigated. Hemostasis was observed.  Frozen section returned a benign cyst adenofibroma.  The fascia was reapproximated with 0 looped PDS using a total of two sutures. The subcutaneous layer was then irrigated copiously.  The subcutaneous layer infiltrated with Enspiril.. The subcutaneous layer was then reapproximated with a running 4-0 Monocryl. The patient tolerated the procedure well.   Sponge, lap and needle counts were correct x  3.    The patient had sequential compression devices for VTE prophylaxis and will receive Lovenox postoperatively. The patient was extubated and taken to the  recovery room in stable condition.

## 2013-02-20 NOTE — Anesthesia Preprocedure Evaluation (Signed)
Anesthesia Evaluation  Patient identified by MRN, date of birth, ID band Patient awake    Reviewed: Allergy & Precautions, H&P , NPO status , Patient's Chart, lab work & pertinent test results  Airway Mallampati: II TM Distance: >3 FB Neck ROM: Full    Dental  (+) Edentulous Upper and Edentulous Lower   Pulmonary neg pulmonary ROS, COPDCurrent Smoker ("Stopped yesterday"; smokes less than 1/4 pak per day),  breath sounds clear to auscultation  Pulmonary exam normal       Cardiovascular hypertension, Pt. on medications and Pt. on home beta blockers + angina (Patient denies any anginal symptoms at present) + CAD, + CABG (08/24/2001), + Peripheral Vascular Disease and +CHF negative cardio ROS  + dysrhythmias Rhythm:Regular Rate:Normal     Neuro/Psych Anxiety negative neurological ROS  negative psych ROS   GI/Hepatic negative GI ROS, Neg liver ROS,   Endo/Other  negative endocrine ROS  Renal/GU negative Renal ROS  negative genitourinary   Musculoskeletal negative musculoskeletal ROS (+)   Abdominal   Peds negative pediatric ROS (+)  Hematology negative hematology ROS (+)   Anesthesia Other Findings   Reproductive/Obstetrics Ovarian mass                           Anesthesia Physical Anesthesia Plan  ASA: III  Anesthesia Plan: General   Post-op Pain Management:    Induction: Intravenous  Airway Management Planned: Oral ETT  Additional Equipment:   Intra-op Plan:   Post-operative Plan: Extubation in OR  Informed Consent: I have reviewed the patients History and Physical, chart, labs and discussed the procedure including the risks, benefits and alternatives for the proposed anesthesia with the patient or authorized representative who has indicated his/her understanding and acceptance.   Dental advisory given  Plan Discussed with: CRNA  Anesthesia Plan Comments:          Anesthesia Quick Evaluation

## 2013-02-20 NOTE — H&P (View-Only) (Signed)
Consult Note: Gyn-Onc   Meredith Gutierrez 72 y.o. female  Chief Complaint  Patient presents with  . Ovarian mass    New Consult    Assessment : Complex right adnexal mass.  Plan: The patient is scheduled to undergo exploratory laparotomy and resection of mass on 02/20/2013. Dr. Wendy Brewster will be the attending surgeon. The extent surgery was discussed with the patient and her daughter. At a minimum bilateral salpingo-oophorectomy would be performed. Should this be an ovarian cancer then surgical staging and debulking surgery were also discussed. The risks of surgery including hemorrhage, infection, injury to adjacent viscera, and thromboembolic complications were discussed. We will obtain preoperative cardiac clearance given her cardiology history. The patient is informed that is unlikely that removing this mass will improve her back pain. All questions are answered.  HPI: Patient seen in consultation at the request of Dr. Pamela Penner regarding management of a newly diagnosed pelvic mass. Patient was undergoing evaluation for low back pain. An MRI was obtained showing a pelvic mass. Subsequently the patient's had an ultrasound and a CT scan all confirmed a pelvic mass. Ultrasound shows the mass measuring 10.6 x 8.7 x 10.4 cm. Most of it is cystic although there is some mural nodularity and irregularity with septations. CT scan shows no evidence of adenopathy or ascites. She does have a right hydronephrosis.  Patient's previously had a hysterectomy (abdominal through midline incision)  She has no other gynecologic history. She has no other GI or GU symptoms.  Review of Systems:10 point review of systems is negative except as noted in interval history.   Vitals: Blood pressure 136/80, pulse 70, temperature 98.6 F (37 C), temperature source Oral, resp. rate 16, height 5' 1.38" (1.559 m), weight 141 lb 4.8 oz (64.093 kg).  Physical Exam: General : The patient is a healthy woman in no  acute distress.  HEENT: normocephalic, extraoccular movements normal; neck is supple without thyromegally  Lynphnodes: Supraclavicular and inguinal nodes not enlarged  Abdomen: Soft, non-tender, no ascites, there is fullness in the right lower quadrant. no hernias  Pelvic:  EGBUS: Normal female  Vagina: Normal, no lesions  Urethra and Bladder: Normal, non-tender  Cervix: Surgically absent  Uterus: Surgically absent  Bi-manual examination: Cystic 10 cm mass in the right adnexa which is mobile slightly tender. There is no nodularity. Rectal: normal sphincter tone, no masses, no blood  Lower extremities: No edema or varicosities. Normal range of motion      Allergies  Allergen Reactions  . Codeine     Shortness of breath  . Crestor [Rosuvastatin Calcium]     Weakness with statins  . Lopressor [Metoprolol Tartrate]     Bradycardia   . Penicillins     Shortness of breath  . Prednisone     Shortness of breath  . Zetia [Ezetimibe]     Weakness     Past Medical History  Diagnosis Date  . Hypertension   . Coronary artery disease   . Anxiety   . Angina   . Diastolic dysfunction 08/10/2011  . Diastolic CHF, on admit treated. 08/10/2011    Past Surgical History  Procedure Laterality Date  . Coronary artery bypass graft    . Abdominal hysterectomy    . Appendectomy    . Cesarean section      Current Outpatient Prescriptions  Medication Sig Dispense Refill  . ALPRAZolam (NIRAVAM) 0.25 MG dissolvable tablet Take 0.25 mg by mouth 2 (two) times daily. Takes every day per patient      .   aspirin 81 MG chewable tablet Chew 81 mg by mouth daily.      . diltiazem (CARDIZEM CD) 120 MG 24 hr capsule Take 1 capsule (120 mg total) by mouth daily.  30 capsule  6  . enalapril (VASOTEC) 20 MG tablet Take 20 mg by mouth daily.      . hydrALAZINE (APRESOLINE) 50 MG tablet Take 50 mg by mouth 2 (two) times daily.      . loratadine (CLARITIN) 10 MG tablet Take 10 mg by mouth daily.      .  meloxicam (MOBIC) 7.5 MG tablet Take 7.5 mg by mouth daily.      . amLODipine (NORVASC) 5 MG tablet Take 1 tablet (5 mg total) by mouth daily.  30 tablet  11  . nitroGLYCERIN (NITROSTAT) 0.4 MG SL tablet Place 1 tablet (0.4 mg total) under the tongue every 5 (five) minutes as needed for chest pain.  25 tablet  4  . timolol (TIMOPTIC) 0.25 % ophthalmic solution Place 1 drop into both eyes 2 (two) times daily.       No current facility-administered medications for this visit.    History   Social History  . Marital Status: Widowed    Spouse Name: N/A    Number of Children: N/A  . Years of Education: N/A   Occupational History  . Not on file.   Social History Main Topics  . Smoking status: Current Every Day Smoker -- 0.50 packs/day for 30 years    Types: Cigarettes  . Smokeless tobacco: Not on file     Comment: smoking cessation info given  . Alcohol Use: No  . Drug Use: No  . Sexual Activity: No   Other Topics Concern  . Not on file   Social History Narrative  . No narrative on file    Family History  Problem Relation Age of Onset  . Stroke Mother   . Hypotension Mother   . Coronary artery disease Father   . Hypotension Father   . Breast cancer Maternal Aunt   . Hypotension Sister   . Hypotension Brother       CLARKE-PEARSON,Behr Cislo L, MD 02/02/2013, 10:51 AM        

## 2013-02-20 NOTE — Care Management Note (Signed)
    Page 1 of 1   02/20/2013     2:56:23 PM   CARE MANAGEMENT NOTE 02/20/2013  Patient:  Meredith Gutierrez, Meredith Gutierrez   Account Number:  [id number]  Date Initiated:  02/20/2013  Documentation initiated by:  Lorenda Ishihara  Subjective/Objective Assessment:   72 yo female admitted s/p Exploratory laparotomy with bilateral salpingo-oophorectomy. PTA lived at home with daughter.     Action/Plan:   Home when stable   Anticipated DC Date:  02/23/2013   Anticipated DC Plan:  HOME/SELF CARE      DC Planning Services  CM consult      Choice offered to / List presented to:             Status of service:  Completed, signed off Medicare Important Message given?   (If response is "NO", the following Medicare IM given date fields will be blank) Date Medicare IM given:   Date Additional Medicare IM given:    Discharge Disposition:  HOME/SELF CARE  Per UR Regulation:  Reviewed for med. necessity/level of care/duration of stay  If discussed at Long Length of Stay Meetings, dates discussed:    Comments:

## 2013-02-20 NOTE — Progress Notes (Signed)
Pt ambulated to one end of the hall and back without complications. Stated "she felt a little sore, but it feels good to be up and moving." Pt did burp some during her ambulation. Denied any nausea, dizziness, light headedness. She tolerated well. Rocker placed at the bedside and pt sat in rocker for 30 min. Tolerated her activity well. Daughter was at the bedside. Encouraged her to push fluids and continue IS use every 1-2hrs while awake. She verbalized understanding.

## 2013-02-20 NOTE — Transfer of Care (Signed)
Immediate Anesthesia Transfer of Care Note  Patient: Meredith Gutierrez  Procedure(s) Performed: Procedure(s): EXPLORATORY LAPAROTOMY (N/A) BILATERAL SALPINGO OOPHORECTOMY (Bilateral)  Patient Location: PACU  Anesthesia Type:General  Level of Consciousness: awake, alert , oriented and patient cooperative  Airway & Oxygen Therapy: Patient Spontanous Breathing and Patient connected to face mask oxygen  Post-op Assessment: Report given to PACU RN, Post -op Vital signs reviewed and stable and Patient moving all extremities X 4  Post vital signs: stable  Complications: No apparent anesthesia complications

## 2013-02-20 NOTE — Progress Notes (Signed)
Utilization review completed.  

## 2013-02-21 ENCOUNTER — Encounter (HOSPITAL_COMMUNITY): Payer: Self-pay | Admitting: Gynecologic Oncology

## 2013-02-21 LAB — BASIC METABOLIC PANEL
BUN: 10 mg/dL (ref 6–23)
CO2: 27 mEq/L (ref 19–32)
Chloride: 104 mEq/L (ref 96–112)
Creatinine, Ser: 0.51 mg/dL (ref 0.50–1.10)
GFR calc Af Amer: >90 mL/min (ref >90)
Potassium: 4.3 mEq/L (ref 3.5–5.1)

## 2013-02-21 LAB — CBC
HCT: 36.6 % (ref 36.0–46.0)
Hemoglobin: 12.1 g/dL (ref 12.0–15.0)
MCHC: 33.1 g/dL (ref 30.0–36.0)
MCV: 92.7 fL (ref 78.0–100.0)
RDW: 13.6 % (ref 11.5–15.5)
WBC: 13.6 10*3/uL — ABNORMAL HIGH (ref 4.0–10.5)

## 2013-02-21 MED ORDER — PANTOPRAZOLE SODIUM 40 MG PO TBEC
40.0000 mg | DELAYED_RELEASE_TABLET | Freq: Two times a day (BID) | ORAL | Status: DC
  Administered 2013-02-21 – 2013-02-25 (×8): 40 mg via ORAL
  Filled 2013-02-21 (×10): qty 1

## 2013-02-21 MED ORDER — BISACODYL 10 MG RE SUPP
10.0000 mg | Freq: Once | RECTAL | Status: AC
  Administered 2013-02-21: 10 mg via RECTAL
  Filled 2013-02-21: qty 1

## 2013-02-21 MED ORDER — IBUPROFEN 600 MG PO TABS
600.0000 mg | ORAL_TABLET | Freq: Four times a day (QID) | ORAL | Status: DC
  Administered 2013-02-21 – 2013-02-25 (×13): 600 mg via ORAL
  Filled 2013-02-21 (×20): qty 1

## 2013-02-21 NOTE — Progress Notes (Signed)
1 Day Post-Op Procedure(s) (LRB): EXPLORATORY LAPAROTOMY (N/A) BILATERAL SALPINGO OOPHORECTOMY (Bilateral)  Subjective: Patient reports doing well this am.  Adequate pain relief reported.  No nausea or emesis.  She just ordered an omelet for breakfast.  Denies chest pain, dyspnea, passing flatus, or having a bowel movement.  No concerns voiced.  Objective: Vital signs in last 24 hours: Temp:  [97.5 F (36.4 C)-98.1 F (36.7 C)] 98.1 F (36.7 C) (09/10 0541) Pulse Rate:  [53-74] 58 (09/10 0541) Resp:  [10-18] 18 (09/10 0541) BP: (100-155)/(57-80) 108/60 mmHg (09/10 0541) SpO2:  [94 %-100 %] 97 % (09/10 0541) Weight:  [142 lb 1.6 oz (64.456 kg)] 142 lb 1.6 oz (64.456 kg) (09/09 1600) Last BM Date: 02/19/13  Intake/Output from previous day: 09/09 0701 - 09/10 0700 In: 3519.2 [P.O.:1080; I.V.:2439.2] Out: 2075 [Urine:2025; Blood:50]  Physical Examination: General: alert, cooperative and no distress Resp: clear to auscultation bilaterally Cardio: regular rate and rhythm, S1, S2 normal, no murmur, click, rub or gallop GI: incision: midline incision with minimal amount of serosanguinous drainage, subcutaneous closure intact, steri strips applied and abdomen mildly distended and tympanic, active bowel sounds, non-tender Extremities: extremities normal, atraumatic, no cyanosis or edema  Labs: WBC/Hgb/Hct/Plts:  13.6/12.1/36.6/228 (09/10 9528) BUN/Cr/glu/ALT/AST/amyl/lip:  10/0.51/--/--/--/--/-- (09/10 4132)  Assessment: 72 y.o. s/p Procedure(s): EXPLORATORY LAPAROTOMY BILATERAL SALPINGO OOPHORECTOMY: stable Pain:  Pain is well-controlled on PRN medications.  Heme:  Hgb 12.1 and Hct 36.6 this am.  Stable post-operatively.  CV: Hx HTN, CAD, diastolic CHF.  Current treatment:  diltiazem (Cardizem), enalapril (Vasotec), and Apresoline.  GI:  Tolerating po: Yes     FEN: Stable post-operatively.  Prophylaxis: intermittent pneumatic compression boots.  Plan: Saline lock  IV Dulcolax suppository this am Ibuprofen 600 mg Q6H per Dr. Tamela Oddi Encourage ambulation, IS use, deep breathing, and coughing Continue post-operative plan of care per Dr. Tamela Oddi The patient will be discharged to home at the end of her stay.   LOS: 1 day    Meredith Gutierrez 02/21/2013, 9:14 AM

## 2013-02-22 LAB — BASIC METABOLIC PANEL
BUN: 8 mg/dL (ref 6–23)
CO2: 31 mEq/L (ref 19–32)
Calcium: 9.5 mg/dL (ref 8.4–10.5)
Creatinine, Ser: 0.53 mg/dL (ref 0.50–1.10)

## 2013-02-22 MED ORDER — BISACODYL 10 MG RE SUPP
10.0000 mg | Freq: Once | RECTAL | Status: AC
  Administered 2013-02-22: 10 mg via RECTAL
  Filled 2013-02-22: qty 1

## 2013-02-22 NOTE — Progress Notes (Signed)
2 Days Post-Op Procedure(s) (LRB): EXPLORATORY LAPAROTOMY (N/A) BILATERAL SALPINGO OOPHORECTOMY (Bilateral)  Subjective: Patient reports moderate nausea this am beginning late yesterday evening.  "Spit up a little last night."  Unable to tolerate solid food this am.  No pain reported at this time.  Stating that she had a very small bowel movement yesterday with no flatus post-op.  Denies chest pain, dyspnea, passing flatus, or having a significant bowel movement.  She reports having a history of an irregular heart rate.  No concerns voiced.  Objective: Vital signs in last 24 hours: Temp:  [98 F (36.7 C)-98.5 F (36.9 C)] 98.5 F (36.9 C) (09/11 0531) Pulse Rate:  [64-75] 71 (09/11 0531) Resp:  [16-18] 18 (09/11 0531) BP: (100-145)/(53-72) 145/68 mmHg (09/11 0531) SpO2:  [95 %-100 %] 95 % (09/11 0531) Last BM Date: 02/21/13  Intake/Output from previous day: 09/10 0701 - 09/11 0700 In: 1440 [P.O.:740; I.V.:200] Out: 1801 [Urine:1800; Stool:1]  Physical Examination: General: alert, cooperative and no distress Resp: clear to auscultation bilaterally Cardio: irregularly irregular rhythm GI: incision: midline incision with subcutaneous closure intact, steri strips intact, no drainage or erythema and abdomen mildly distended and tympanic, active bowel sounds, non-tender Extremities: extremities normal, atraumatic, no cyanosis or edema  Assessment: 72 y.o. s/p Procedure(s): EXPLORATORY LAPAROTOMY BILATERAL SALPINGO OOPHORECTOMY: stable Pain:  Pain is well-controlled on PRN medications.  Heme:  Hgb 12.1 and Hct 36.6 on 02/21/13.  Stable post-operatively.  CV: Hx HTN, CAD, diastolic CHF.  Current treatment:  diltiazem (Cardizem), enalapril (Vasotec), and Apresoline.  GI:  Tolerating po: No.  Small bowel movement yesterday afternoon.  Nausea this am with no flatus.     FEN: Stable post-operatively.  Prophylaxis: intermittent pneumatic compression boots.  Plan: Dulcolax  suppository this am Anti-emetics as needed Sips of clear liquids until nausea resolves Encourage ambulation, IS use, deep breathing, and coughing Continue post-operative plan of care per Dr. Tamela Oddi The patient will be discharged to home at the end of her stay.   LOS: 2 days    CROSS, MELISSA DEAL 02/22/2013, 8:43 AM

## 2013-02-23 ENCOUNTER — Inpatient Hospital Stay (HOSPITAL_COMMUNITY): Payer: Medicare Other

## 2013-02-23 LAB — BASIC METABOLIC PANEL
CO2: 28 mEq/L (ref 19–32)
Calcium: 8.9 mg/dL (ref 8.4–10.5)
Creatinine, Ser: 0.58 mg/dL (ref 0.50–1.10)
GFR calc non Af Amer: 89 mL/min — ABNORMAL LOW (ref >90)
Glucose, Bld: 165 mg/dL — ABNORMAL HIGH (ref 70–99)
Sodium: 134 mEq/L — ABNORMAL LOW (ref 135–145)

## 2013-02-23 MED ORDER — BISACODYL 10 MG RE SUPP
10.0000 mg | Freq: Every day | RECTAL | Status: DC
  Administered 2013-02-23 – 2013-02-25 (×3): 10 mg via RECTAL
  Filled 2013-02-23 (×3): qty 1

## 2013-02-23 MED ORDER — KCL IN DEXTROSE-NACL 20-5-0.45 MEQ/L-%-% IV SOLN
INTRAVENOUS | Status: DC
  Administered 2013-02-23 – 2013-02-25 (×5): via INTRAVENOUS
  Filled 2013-02-23 (×6): qty 1000

## 2013-02-23 NOTE — Anesthesia Postprocedure Evaluation (Signed)
Anesthesia Post Note  Patient: Meredith Gutierrez  Procedure(s) Performed: Procedure(s) (LRB): EXPLORATORY LAPAROTOMY (N/A) BILATERAL SALPINGO OOPHORECTOMY (Bilateral)  Anesthesia type: General  Patient location: PACU  Post pain: Pain level controlled  Post assessment: Post-op Vital signs reviewed  Last Vitals:  Filed Vitals:   02/23/13 0555  BP: 129/84  Pulse: 63  Temp: 36.6 C  Resp: 16    Post vital signs: Reviewed  Level of consciousness: sedated  Complications: No apparent anesthesia complications

## 2013-02-23 NOTE — Progress Notes (Signed)
3 Days Post-Op Procedure(s) (LRB): EXPLORATORY LAPAROTOMY (N/A) BILATERAL SALPINGO OOPHORECTOMY (Bilateral)  Subjective: Patient reports resolution of nausea but decreased appetite.  Tolerating clear liquids.  No pain reported at this time.  Had one episode of flatus.  Denies chest pain, dyspnea, or having a significant bowel movement.  No concerns voiced.  Objective: Vital signs in last 24 hours: Temp:  [97.8 F (36.6 C)-98.3 F (36.8 C)] 97.8 F (36.6 C) (09/12 0555) Pulse Rate:  [58-89] 61 (09/12 0940) Resp:  [16-18] 16 (09/12 0555) BP: (118-140)/(56-85) 140/85 mmHg (09/12 0940) SpO2:  [93 %-96 %] 96 % (09/12 0555) Last BM Date: 02/21/13  Intake/Output from previous day: 09/11 0701 - 09/12 0700 In: 240 [P.O.:240] Out: 3350 [Urine:3350]  Physical Examination: General: alert, cooperative and no distress Resp: clear to auscultation bilaterally Cardio: irregularly irregular rhythm GI: incision: midline incision with subcutaneous closure intact, steri strips intact, no drainage or erythema and abdomen mildly distended and tympanic, active bowel sounds, non-tender Extremities: extremities normal, atraumatic, no cyanosis or edema  Assessment: 72 y.o. s/p Procedure(s): EXPLORATORY LAPAROTOMY BILATERAL SALPINGO OOPHORECTOMY: stable Pain:  Pain is well-controlled on PRN medications.  Heme:  Hgb 12.1 and Hct 36.6 on 02/21/13.  Stable post-operatively.  CV: Hx HTN, CAD, diastolic CHF.  Current treatment:  diltiazem (Cardizem), enalapril (Vasotec), and Apresoline.  GI:  Tolerating po: Yes.  Small bowel movement 02/21/13 afternoon.  One episode of flatus.     FEN: Stable post-operatively.  Prophylaxis: intermittent pneumatic compression boots.  Plan: Dulcolax suppository this am Bmet now and in am DG Abdomen 2 view to assess abdomen with increased distention and minimal flatus/bowel movement Encourage ambulation, IS use, deep breathing, and coughing Continue post-operative  plan of care per Dr. Tamela Oddi The patient will be discharged to home at the end of her stay   LOS: 3 days    Kylea Berrong DEAL 02/23/2013, 10:21 AM

## 2013-02-24 LAB — BASIC METABOLIC PANEL
BUN: 8 mg/dL (ref 6–23)
Calcium: 9.4 mg/dL (ref 8.4–10.5)
Creatinine, Ser: 0.53 mg/dL (ref 0.50–1.10)
GFR calc Af Amer: >90 mL/min (ref >90)
GFR calc non Af Amer: >90 mL/min (ref >90)
Glucose, Bld: 112 mg/dL — ABNORMAL HIGH (ref 70–99)
Potassium: 4.6 mEq/L (ref 3.5–5.1)

## 2013-02-25 MED ORDER — TRAMADOL HCL 50 MG PO TABS: 50.0000 mg | ORAL_TABLET | Freq: Four times a day (QID) | ORAL | Status: DC

## 2013-02-25 NOTE — Progress Notes (Signed)
5 Days Post-Op Procedure(s) (LRB): EXPLORATORY LAPAROTOMY (N/A) BILATERAL SALPINGO OOPHORECTOMY (Bilateral)  Subjective: Patient reports tolerating PO, + flatus and no problems voiding.    Objective: I have reviewed patient's vital signs, intake and output, medications and labs.  General: alert and no distress GI: normal findings: bowel sounds normal, soft, non-tender and nondistended. Extremities: extremities normal, atraumatic, no cyanosis or edema Vaginal Bleeding: none Incision: C, D, I.  Assessment: s/p Procedure(s): EXPLORATORY LAPAROTOMY (N/A) BILATERAL SALPINGO OOPHORECTOMY (Bilateral): stable, progressing well and tolerating clear liquids.    Plan: Advance diet Possible discharge home later today.  LOS: 5 days    HARPER,CHARLES A 02/25/2013, 5:55 AM

## 2013-02-25 NOTE — Discharge Summary (Signed)
Physician Discharge Summary  Patient ID: Meredith Gutierrez MRN: [id number] DOB/AGE: [birth year] 72 y.o.  Admit date: 02/20/2013 Discharge date: 02/25/2013  Admission Diagnoses: Pelvic Mass  Discharge Diagnoses: Same Active Problems:   * No active hospital problems. *   Discharged Condition: good  Hospital Course: Underwent Exploratory Laparotomy and BSO without complications.  Post op course uncomplicated.  Discharged home in good condition.  Consults: None  Significant Diagnostic Studies: labs: CBC, CMET  Treatments: analgesia: Ultram  Discharge Exam: Blood pressure 140/87, pulse 55, temperature 97.9 F (36.6 C), temperature source Oral, resp. rate 18, height 5\' 1"  (1.549 m), weight 142 lb 1.6 oz (64.456 kg), SpO2 97.00%. General appearance: alert and no distress Resp: clear to auscultation bilaterally Cardio: regular rate and rhythm, S1, S2 normal, no murmur, click, rub or gallop GI: normal findings: soft, non-tender Extremities: extremities normal, atraumatic, no cyanosis or edema Incision/Wound: C, D, I.  Disposition: 01-Home or Self Care  Discharge Orders   Future Appointments Provider Department Dept Phone   03/20/2013 1:30 PM [name], MD [facility] [phone]   Future Orders Complete By Expires   (HEART FAILURE PATIENTS) Call MD:  Anytime you have any of the following symptoms: 1) 3 pound weight gain in 24 hours or 5 pounds in 1 week 2) shortness of breath, with or without a dry hacking cough 3) swelling in the hands, feet or stomach 4) if you have to sleep on extra pillows at night in order to breathe.  As directed    Call MD for:  difficulty breathing, headache or visual disturbances  As directed    Call MD for:  extreme fatigue  As directed    Call MD for:  hives  As directed    Call MD for:  persistant dizziness or light-headedness  As directed    Call MD for:  persistant nausea and vomiting  As directed    Call MD for:   redness, tenderness, or signs of infection (pain, swelling, redness, odor or green/yellow discharge around incision site)  As directed    Call MD for:  severe uncontrolled pain  As directed    Call MD for:  temperature >100.4  As directed    Call MD for:  As directed    Diet - low sodium heart healthy  As directed    Discharge instructions  As directed    Comments:     Routine   Increase activity slowly  As directed        Medication List         ALPRAZolam 0.25 MG dissolvable tablet  Commonly known as:  NIRAVAM  Take 0.25 mg by mouth 2 (two) times daily. Takes every day per patient     ammonium lactate 12 % lotion  Commonly known as:  LAC-HYDRIN  Apply 1 application topically daily as needed (for rash).     aspirin EC 81 MG tablet  Take 81 mg by mouth daily.     diltiazem 120 MG 24 hr capsule  Commonly known as:  CARDIZEM CD  Take 1 capsule (120 mg total) by mouth daily.     enalapril 10 MG tablet  Commonly known as:  VASOTEC  Take 10 mg by mouth daily.     hydrALAZINE 50 MG tablet  Commonly known as:  APRESOLINE  Take 50 mg by mouth 2 (two) times daily.     loratadine 10 MG tablet  Commonly known as:  CLARITIN  Take 10  mg by mouth daily.     meloxicam 7.5 MG tablet  Commonly known as:  MOBIC  Take 15 mg by mouth daily.     nitroGLYCERIN 0.4 MG SL tablet  Commonly known as:  NITROSTAT  Place 1 tablet (0.4 mg total) under the tongue every 5 (five) minutes as needed for chest pain.     traMADol 50 MG tablet  Commonly known as:  ULTRAM  Take 1 tablet (50 mg total) by mouth every 6 (six) hours.           Follow-up Information   Follow up with [name], MD. Schedule an appointment as soon as possible for a visit in 1 week.   Specialty:  Obstetrics and Gynecology   Contact information:   [address] [place] [address] [phone]       Signed: Azell Bill A 02/25/2013, 8:07 AM

## 2013-02-25 NOTE — Progress Notes (Signed)
Patient & daughter ready for d/c home. Dulcolax suppository inserted this a.m. Passing flatus without BM yet. Patient & daughter relay they are comfortable about going home & will notify MD of any problems with constipation. Discomfort denied. Lunch tolerated. Has been ambulatory in hallway. D/C instructions given in presence of daughter. Belongings carried out with daughter. Patient transported in w/c from room to vehicle.

## 2013-02-26 ENCOUNTER — Telehealth: Payer: Self-pay | Admitting: Gynecologic Oncology

## 2013-02-26 NOTE — Telephone Encounter (Signed)
Post op telephone call to check patient status.  Patient describes expected post operative status.  Adequate PO intake reported.  Bowels and bladder functioning without difficulty.  Pain minimal.  Reportable signs and symptoms reviewed.  Follow up appt for Oct 2.

## 2013-03-01 ENCOUNTER — Ambulatory Visit: Payer: Medicare Other | Attending: Gynecologic Oncology | Admitting: Gynecologic Oncology

## 2013-03-01 ENCOUNTER — Telehealth: Payer: Self-pay | Admitting: Gynecologic Oncology

## 2013-03-01 ENCOUNTER — Other Ambulatory Visit: Payer: Self-pay | Admitting: Gynecologic Oncology

## 2013-03-01 ENCOUNTER — Ambulatory Visit (HOSPITAL_COMMUNITY)
Admission: RE | Admit: 2013-03-01 | Discharge: 2013-03-01 | Disposition: A | Discharge status: Home / Self Care | Payer: Medicare Other | Source: Ambulatory Visit | Attending: Gynecologic Oncology | Admitting: Gynecologic Oncology

## 2013-03-01 ENCOUNTER — Ambulatory Visit (HOSPITAL_BASED_OUTPATIENT_CLINIC_OR_DEPARTMENT_OTHER): Payer: Medicare Other | Admitting: Lab

## 2013-03-01 VITALS — BP 137/70 | HR 84 | Temp 99.3°F | Resp 18

## 2013-03-01 DIAGNOSIS — R197 Diarrhea, unspecified: Secondary | ICD-10-CM

## 2013-03-01 DIAGNOSIS — R141 Gas pain: Secondary | ICD-10-CM | POA: Insufficient documentation

## 2013-03-01 DIAGNOSIS — R142 Eructation: Secondary | ICD-10-CM | POA: Insufficient documentation

## 2013-03-01 DIAGNOSIS — R109 Unspecified abdominal pain: Secondary | ICD-10-CM | POA: Insufficient documentation

## 2013-03-01 LAB — CBC WITH DIFFERENTIAL/PLATELET
BASO%: 0.5 % (ref 0.0–2.0)
Eosinophils Absolute: 0.4 10*3/uL (ref 0.0–0.5)
HCT: 39.6 % (ref 34.8–46.6)
LYMPH%: 17.5 % (ref 14.0–49.7)
MONO#: 1.3 10*3/uL — ABNORMAL HIGH (ref 0.1–0.9)
NEUT#: 11.5 10*3/uL — ABNORMAL HIGH (ref 1.5–6.5)
NEUT%: 71.7 % (ref 38.4–76.8)
Platelets: 393 10*3/uL (ref 145–400)
WBC: 16 10*3/uL — ABNORMAL HIGH (ref 3.9–10.3)
lymph#: 2.8 10*3/uL (ref 0.9–3.3)

## 2013-03-01 LAB — COMPREHENSIVE METABOLIC PANEL (CC13)
ALT: 17 U/L (ref 0–55)
CO2: 24 mEq/L (ref 22–29)
Calcium: 9.7 mg/dL (ref 8.4–10.4)
Chloride: 105 mEq/L (ref 98–109)
Glucose: 110 mg/dl (ref 70–140)
Sodium: 140 mEq/L (ref 136–145)
Total Bilirubin: 0.27 mg/dL (ref 0.20–1.20)
Total Protein: 7.1 g/dL (ref 6.4–8.3)

## 2013-03-01 LAB — CLOSTRIDIUM DIFFICILE BY PCR: Toxigenic C. Difficile by PCR: NEGATIVE

## 2013-03-01 NOTE — Telephone Encounter (Signed)
Notified of lab results including c diff test being negative.  Informed of abd xray results and Dr. Forrestine Him recommendations to take in clear liquids and limit solid food at this time.  Verbalizing understanding.  Reportable signs and symptoms reviewed.  F/U appt made for Tues, 9/23.

## 2013-03-02 ENCOUNTER — Encounter: Payer: Self-pay | Admitting: Gynecologic Oncology

## 2013-03-02 ENCOUNTER — Telehealth: Payer: Self-pay | Admitting: Gynecologic Oncology

## 2013-03-02 DIAGNOSIS — R197 Diarrhea, unspecified: Secondary | ICD-10-CM | POA: Insufficient documentation

## 2013-03-02 LAB — OVA AND PARASITE EXAMINATION

## 2013-03-02 NOTE — Patient Instructions (Signed)
We will contact you with the results of your labs and abdomen xray.

## 2013-03-02 NOTE — Progress Notes (Signed)
Follow Up Note: Gyn-Onc  Meredith Gutierrez 72 y.o. female  CC:  Chief Complaint  Patient presents with  . Post op diarrhea    Follow up    HPI:  Meredith Gutierrez is a 72 year old seen in consultation at the request of Dr. Alinda Deem regarding management of a newly diagnosed pelvic mass. Patient was undergoing evaluation for low back pain. An MRI was obtained showing a pelvic mass. Subsequently the patient had an ultrasound and a CT scan, which confirmed a pelvic mass. Ultrasound shows the mass measuring 10.6 x 8.7 x 10.4 cm. Most of it is cystic although there is some mural nodularity and irregularity with septations. CT scan shows no evidence of adenopathy or ascites. She does have a right hydronephrosis.  Patient's previously had a hysterectomy (abdominal through midline incision) and has no other gynecologic history.  On 02/20/13, she underwent an exploratory laparotomy with bilateral salpingo-oophorectomy by Dr. Nelly Rout.  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    Interval History:  She presents today post-operatively with complaints of intermittent early morning nausea and persistent diarrhea.  She reports having loose stools since being discharged from the hospital.  She states that she had 15 loose stools yesterday with no blood or foul odor.  Describing stools as watery.  "Not really had any flatus since I have been home."  She further reports nausea when she wakes up that eases off throughout the day.  She has a decreased appetite but states she has been making herself eat.  No emesis reported.  No fever, chills, or vaginal bleeding.  No issues reported with her incision.  Minimal pain reported.  Voiding without difficulty.  She denies any close relatives with any recent acute infectious illnesses.  No other concerns voiced.   Review of Systems  Constitutional:  Feels mildly nauseated.  No fever, chills, unintentional weight loss or gain.  Decreased appetite.  Cardiovascular: No chest pain, shortness of breath, or edema.  Pulmonary: No cough or wheeze.  Gastrointestinal: Intermittent nausea, more in the am.  No vomiting, or diarrhea. No bright red blood per rectum or change in bowel movement.  Loose stools reported throughout the day.  "Goes straight through when I eat."  Genitourinary: No frequency, urgency, or dysuria. No vaginal bleeding or discharge.  Musculoskeletal: No myalgia or joint pain. Neurologic: No weakness, numbness, or change in gait.  Psychology: No depression, anxiety, or insomnia.  Current Meds:  Outpatient Encounter Prescriptions as of 03/01/2013  Medication Sig Dispense Refill  . ALPRAZolam (NIRAVAM) 0.25 MG dissolvable tablet Take 0.25 mg by mouth 2 (two) times daily. Takes every day per patient      . ammonium lactate (LAC-HYDRIN) 12 % lotion Apply 1 application topically daily as needed (for rash).       Marland Kitchen aspirin EC 81 MG tablet Take 81 mg by mouth daily.      Marland Kitchen diltiazem (CARDIZEM CD) 120 MG 24 hr capsule Take 1 capsule (120 mg total) by mouth daily.  30 capsule  6  . enalapril (VASOTEC) 10 MG tablet Take 10 mg by mouth daily.      . hydrALAZINE (APRESOLINE) 50 MG tablet Take 50 mg by mouth 2 (two) times daily.      Marland Kitchen loratadine (CLARITIN) 10 MG tablet Take 10 mg by mouth daily.      . meloxicam (MOBIC) 7.5 MG tablet Take  15 mg by mouth daily.       . nitroGLYCERIN (NITROSTAT) 0.4 MG SL tablet Place 1 tablet (0.4 mg total) under the tongue every 5 (five) minutes as needed for chest pain.  25 tablet  4  . traMADol (ULTRAM) 50 MG tablet Take 1 tablet (50 mg total) by mouth every 6 (six) hours.  30 tablet  2   No facility-administered encounter medications on file as of 03/01/2013.    Allergy:  Allergies  Allergen Reactions  . Codeine     Shortness of breath  . Crestor [Rosuvastatin Calcium]     Weakness with statins  .  Lopressor [Metoprolol Tartrate]     Bradycardia   . Penicillins     Shortness of breath  . Prednisone     Shortness of breath  . Zetia [Ezetimibe]     Weakness     Social Hx:   History   Social History  . Marital Status: Widowed    Spouse Name: N/A    Number of Children: N/A  . Years of Education: N/A   Occupational History  . Not on file.   Social History Main Topics  . Smoking status: Current Every Day Smoker -- 0.10 packs/day for 20 years    Types: Cigarettes  . Smokeless tobacco: Never Used     Comment: smoking cessation info given  . Alcohol Use: No  . Drug Use: No  . Sexual Activity: No   Other Topics Concern  . Not on file   Social History Narrative  . No narrative on file    Past Surgical Hx:  Past Surgical History  Procedure Laterality Date  . Abdominal hysterectomy    . Appendectomy    . Cesarean section    . Cardiac catheterization  08/09/2011    no sign. coronary obstruction. LIMA is atretic w/excellent flow down the native LAD  . Cardiac catheterization  08/22/2001    no sign. coronary stenosis,  . Nm myocar perf wall motion  07/20/2010    normal  . Cataract extraction, bilateral Bilateral   . Coronary artery bypass graft  02-16-13    3'03-no bypasses -Had 2 aneurysm repairs(stents used)  . Laparotomy N/A 02/20/2013    Procedure: EXPLORATORY LAPAROTOMY;  Surgeon: [name], MD;  Location: [facility];  Service: Gynecology;  Laterality: N/A;  . Salpingoophorectomy Bilateral 02/20/2013    Procedure: BILATERAL SALPINGO OOPHORECTOMY;  Surgeon: [name], MD;  Location: [facility];  Service: Gynecology;  Laterality: Bilateral;    Past Medical Hx:  Past Medical History  Diagnosis Date  . Hypertension   . Coronary artery disease   . Anxiety   . Diastolic dysfunction 08/10/2011  . Diastolic CHF, on admit treated. 08/10/2011  . Cerebral atherosclerosis   . Angina     never needed to take nitroglycerin.  . Sinus congestion     occ. tx. with Loratadine   . History of kidney stones 02-16-13    "hx. of overgrowth of muscle-causing some stricture"renal artery stenosis-being followed [facility]  . Cancer     skin cancers, 1 melanoma removed back -3 months ago  . COPD (chronic obstructive pulmonary disease)     02-16-13-"pt. denies this"-shows on CXR of 2- 2013.    Family Hx:  Family History  Problem Relation Age of Onset  . Stroke Mother   . Hypotension Mother   . Coronary artery disease Father   . Hypotension Father   . Breast cancer Maternal Aunt   . Hypotension Sister   .  Hypotension Brother     Vitals:  Blood pressure 137/70, pulse 84, temperature 99.3 F (37.4 C), resp. rate 18.  Physical Exam:  General: Well developed, well nourished female in no acute distress. Alert and oriented x 3.  Neck: Supple without any enlargements.  Lymph node survey: No cervical, supraclavicular, or inguinal adenopathy.  Cardiovascular: Regular rate and rhythm. S1 and S2 normal.  Lungs: Clear to auscultation bilaterally. No wheezes/crackles/rhonchi noted.  Skin: No rashes or lesions present. Back: No CVA tenderness.  Abdomen: Abdomen mildly distended.  Tympanic with percussion and tenderness with the upper quadrants of the abdomen.  Hypoactive bowel sounds.  Incision with steri strips midline intact with no drainage or erythema. Extremities: No bilateral cyanosis, edema, or clubbing.   Assessment/Plan:  72 year old s/p exploratory laparotomy with bilateral salpingo-oophorectomy by Dr. Nelly Rout on 02/20/13.  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  We will send a stool sample for C. Diff and ova/parasites per Dr. Nelly Rout.  An abdomen 2 view will also be obtained to evaluate increased abdominal distention, diarrhea, and intermittent nausea.  Dr. Nelly Rout spoke with the patient and daughter at the end of the visit to discuss the  plan.  She is advised to call for any questions or concerns and reportable signs and symptoms reviewed.  At this time, she is advised to take in clear liquids until further notice.  Update: 03/01/13 at 17:30 Patient's daughter notified of negative c diff results, labs, and abdomen xray results.  Labs reviewed.  Xray resulted:  Mildly dilated loops of small bowel are noted most consistent with ileus. No colonic dilatation is noted. Follow-up radiographs are recommended.  Dr. Nelly Rout informed of results.  Patient advised to continue clear liquids as previously instructed and to come to the office on Tues, Sept 23 for follow up with Dr. Nelly Rout.  Reportable signs and symptoms reviewed again.  Instructed to call for any needs.     CROSS, MELISSA DEAL, NP 03/02/2013, 3:56 PM

## 2013-03-02 NOTE — Telephone Encounter (Signed)
Called to check on patient's current status.  Message left and advised to call the office with any needs or concerns.

## 2013-03-06 ENCOUNTER — Ambulatory Visit: Payer: Medicare Other | Attending: Gynecologic Oncology | Admitting: Gynecologic Oncology

## 2013-03-06 ENCOUNTER — Ambulatory Visit (HOSPITAL_BASED_OUTPATIENT_CLINIC_OR_DEPARTMENT_OTHER): Payer: Medicare Other | Admitting: Lab

## 2013-03-06 VITALS — BP 141/85 | HR 90 | Temp 98.7°F | Resp 18 | Ht 61.38 in | Wt 137.2 lb

## 2013-03-06 DIAGNOSIS — K560 Paralytic ileus: Secondary | ICD-10-CM

## 2013-03-06 DIAGNOSIS — K567 Ileus, unspecified: Secondary | ICD-10-CM

## 2013-03-06 LAB — CBC WITH DIFFERENTIAL/PLATELET
BASO%: 0.5 % (ref 0.0–2.0)
EOS%: 2.2 % (ref 0.0–7.0)
Eosinophils Absolute: 0.3 10*3/uL (ref 0.0–0.5)
LYMPH%: 24.8 % (ref 14.0–49.7)
MCH: 30.7 pg (ref 25.1–34.0)
MCHC: 34.4 g/dL (ref 31.5–36.0)
MCV: 89.5 fL (ref 79.5–101.0)
MONO%: 7.1 % (ref 0.0–14.0)
NEUT#: 10.4 10*3/uL — ABNORMAL HIGH (ref 1.5–6.5)
Platelets: 424 10*3/uL — ABNORMAL HIGH (ref 145–400)
RBC: 4.29 10*6/uL (ref 3.70–5.45)

## 2013-03-06 NOTE — Patient Instructions (Signed)
Low-Fiber Diet Fiber is found in fruits, vegetables, and grains. A low-fiber diet restricts fibrous foods that are not digested in the small intestine. A diet containing about 10 grams of fiber is considered low fiber.  PURPOSE  To prevent blockage of a partially obstructed or narrowed gastrointestinal tract.  To reduce fecal weight and volume.  To slow the movement of feces. WHEN IS THIS DIET USED?  It may be used during the acute phase of Crohn disease, ulcerative colitis, regional enteritis, or diverticulitis.  It may be used if your intestinal or esophageal tubes are narrowing (stenosis).  It may be used as a transitional diet following surgery, injury (trauma), or illness. CHOOSING FOODS Check labels, especially on foods from the starch list. Often times, dietary fiber content is listed on the nutrition facts panel. Please ask your Registered Dietitian if you have questions about specific foods that are related to your condition, especially if the food is not listed on this handout. Breads and Starches  Allowed: White, French, and pita breads, plain rolls, buns, or sweet rolls, doughnuts, waffles, pancakes, bagels. Plain muffins, biscuits, matzoth. Soda, saltine, graham crackers. Pretzels, rusks, melba toast, zwieback. Cooked cereals: cornmeal, farina, or cream cereals. Dry cereals: refined corn, wheat, rice, and oat cereals (check label). Potatoes prepared any way without skins, refined macaroni, spaghetti, noodles, refined rice.  Avoid: Whole-wheat bread, rolls, and crackers. Multigrains, rye, bran seeds, nuts, or coconut. Cereals containing whole grains, multigrains, bran, coconut, nuts, raisins. Cooked or dry oatmeal. Coarse wheat cereals, granola. Cereals advertised as "high fiber." Potato skins. Whole-grain pasta, wild or brown rice. Popcorn. Vegetables  Allowed: Strained tomato and vegetable juices. Fresh lettuce, cucumber, spinach. Well-cooked or canned: asparagus, bean sprouts,  broccoli, cut green beans, cauliflower, pumpkin, beets, mushrooms, olives, yellow squash, tomato, tomato sauce, zucchini, turnips.Keep servings limited to  cup.  Avoid: Fresh, cooked, or canned: artichokes, baked beans, beet greens, Brussels sprouts, corn, kale, legumes, peas, sweet potatoes. Avoid large servings of any vegetables. Fruit  Allowed: All fruit juices except prune juice. Cooked or canned fruits without skin and seeds: apricots, applesauce, cantaloupe, cherries, grapefruit, grapes, kiwi, mandarin oranges, peaches, pears, fruit cocktail, pineapple, plums, watermelon. Fresh without skin: banana, grapes, cantaloupe, avocado, cherries, pineapple, kiwi, nectarines, peaches, blueberries. Keep servings limited to  cup or 1 piece.  Avoid: Fresh: apples with or without skin, apricots, mangoes, pears, raspberries, strawberries. Prune juice and juices with pulp, stewed or dried prunes. Dried fruits, raisins, dates. Avoid large servings of all fresh fruits. Meat and Protein Substitutes  Allowed: Ground or well-cooked tender beef, ham, veal, lamb, pork, poultry. Eggs, plain cheese. Fish, oysters, shrimp, lobster, other seafood. Liver, organ meats. Smooth nut butters.  Avoid: Tough, fibrous meats with gristle. Chunky nut butter.Cheese with seeds, nuts, or other foods not allowed. Nuts, seeds, legumes, dried peas, beans, lentils. Dairy  Allowed: All milk products except those not allowed.  Avoid: Yogurt or cheese that contains nuts, seeds, or added fruit. Soups and Combination Foods  Allowed: Bouillon, broth, or cream soups made from allowed foods. Any strained soup. Casseroles or mixed dishes made with allowed foods.  Avoid: Soups made from vegetables that are not allowed or that contain other foods not allowed. Desserts and Sweets  Allowed:Plain cakes and cookies, pie made with allowed fruit, pudding, custard, cream pie. Gelatin, fruit, ice, sherbet, frozen ice pops. Ice cream, ice milk  without nuts. Plain hard candy, honey, jelly, molasses, syrup, sugar, chocolate syrup, gumdrops, marshmallows.  Avoid: Desserts, cookies, or candies that   contain nuts, peanut butter, dried fruits. Jams, preserves with seeds, marmalade. Fats and Oils  Allowed:Margarine, butter, cream, mayonnaise, salad oils, plain salad dressings made from allowed foods.  Avoid: Seeds, nuts, olives. Beverages  Allowed: All, except those listed to avoid.  Avoid: Fruit juices with high pulp, prune juice. Condiments  Allowed:Ketchup, mustard, horseradish, vinegar, cream sauce, cheese sauce, cocoa powder. Spices in moderation: allspice, basil, bay leaves, celery powder or leaves, cinnamon, cumin powder, curry powder, ginger, mace, marjoram, onion or garlic powder, oregano, paprika, parsley flakes, ground pepper, rosemary, sage, savory, tarragon, thyme, turmeric.  Avoid: Coconut, pickles. SAMPLE MENU Breakfast   cup orange juice.  1 boiled egg.  1 slice white toast.  Margarine.   cup cornflakes.  1 cup milk.  Beverage. Lunch   cup chicken noodle soup.  2 to 3 oz sliced roast beef.  2 slices white bread.  Mayonnaise.   cup tomato juice.  1 small banana.  Beverage. Dinner  3 oz baked chicken.   cup scalloped potatoes.   cup cooked beets.  White dinner roll.  Margarine.   cup canned peaches.  Beverage. Document Released: 11/20/2001 Document Revised: 08/23/2011 Document Reviewed: 06/17/2011 [facility] Patient Information 2014 [place], [place].

## 2013-03-06 NOTE — Progress Notes (Signed)
Follow Up Note: Gyn-Onc  Meredith Gutierrez 72 y.o. female  CC:  Chief Complaint  Patient presents with  . Follow-up    Post-op, f/u ileus    Assessment/Plan:  72 year old s/p exploratory laparotomy with bilateral salpingo-oophorectomy by Dr. Nelly Rout on 02/20/13.  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 Meredith Gutierrez presented last week with report of frequent diarrhea and abdominal bloating.  KUB c/w ileus, WBC 16K.  Patient advised to have a low fiber diet.  On examination she seems much improved.  Good BS, voluminous flatus, but poor appetite. WBC remains elevated.   Continue low residue diet Advance diet as tolerated. F/U Mar 20, 2013      HPI:  Meredith Gutierrez is a 72 year old seen in consultation at the request of Dr. Alinda Deem regarding management of a newly diagnosed pelvic mass. Patient was undergoing evaluation for low back pain. An MRI was obtained showing a pelvic mass. Subsequently the patient had an ultrasound and a CT scan, which confirmed a pelvic mass. Ultrasound shows the mass measuring 10.6 x 8.7 x 10.4 cm. Most of it is cystic although there is some mural nodularity and irregularity with septations. CT scan shows no evidence of adenopathy or ascites. She does have a right hydronephrosis.  Patient's previously had a hysterectomy (abdominal through midline incision) and has no other gynecologic history.  On 02/20/13, she underwent an exploratory laparotomy with bilateral salpingo-oophorectomy by Dr. Nelly Rout.  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    She presented 03/01/2013  with complaints of intermittent early morning nausea and persistent diarrhea, decreased appetite, no emesis  no fever, chills, or vaginal  bleeding.  No issues reported with her incision.  Minimal pain reported.  Voiding without difficulty.    C dif stool cultures were obtained but are negative WBC was elevated to 16.  KUB c/w ileus.   Review of Systems  Constitutional: No fever, chills, decreased appetite. Pulmonary: No cough or wheeze.  Gastrointestinal: No nausea or vomiting, no diarrhea. Stool is formed. Genitourinary: No frequency, urgency, or dysuria.  Musculoskeletal: No myalgia or joint pain. Neurologic: No weakness, numbness, or change in gait.  Psychology: No depression, anxiety, or insomnia.   Vitals:  Blood pressure 141/85, pulse 90, temperature 98.7 F (37.1 C), resp. rate 18, height 5' 1.38" (1.559 m), weight 137 lb 3.2 oz (62.234 kg). Wt Readings from Last 3 Encounters:  03/06/13 137 lb 3.2 oz (62.234 kg)  02/20/13 142 lb 1.6 oz (64.456 kg)  02/20/13 142 lb 1.6 oz (64.456 kg)     Physical Exam:  General: Well developed, well nourished female in no acute distress. Alert and oriented x 3.   Looks much improved over last week.    Back: No CVA tenderness.  Abdomen: Abdomen mildly distended.  Non tympanitic, bowel sounds present.  Incision with steri strips midline intact with no drainage or erythema. Extremities: No bilateral cyanosis, edema, or clubbing.    Meredith Gutierrez,  03/06/2013, 3:13 PM

## 2013-03-15 ENCOUNTER — Ambulatory Visit: Payer: Medicare Other | Admitting: Gynecologic Oncology

## 2013-03-19 ENCOUNTER — Other Ambulatory Visit: Payer: Self-pay | Admitting: *Deleted

## 2013-03-19 MED ORDER — NITROGLYCERIN 0.4 MG SL SUBL: 0.4000 mg | SUBLINGUAL_TABLET | SUBLINGUAL | Status: DC | PRN

## 2013-03-20 ENCOUNTER — Encounter: Payer: Self-pay | Admitting: Gynecologic Oncology

## 2013-03-20 ENCOUNTER — Ambulatory Visit: Payer: Medicare Other | Attending: Gynecologic Oncology | Admitting: Gynecologic Oncology

## 2013-03-20 VITALS — BP 128/88 | HR 86 | Temp 99.1°F | Resp 16 | Ht 61.38 in | Wt 137.5 lb

## 2013-03-20 DIAGNOSIS — D279 Benign neoplasm of unspecified ovary: Secondary | ICD-10-CM | POA: Insufficient documentation

## 2013-03-20 DIAGNOSIS — Z9079 Acquired absence of other genital organ(s): Secondary | ICD-10-CM | POA: Insufficient documentation

## 2013-03-20 DIAGNOSIS — N838 Other noninflammatory disorders of ovary, fallopian tube and broad ligament: Secondary | ICD-10-CM

## 2013-03-20 DIAGNOSIS — Z90710 Acquired absence of both cervix and uterus: Secondary | ICD-10-CM | POA: Insufficient documentation

## 2013-03-20 NOTE — Progress Notes (Signed)
Follow Up Note: Gyn-Onc  Meredith Gutierrez 72 y.o. female  CC:  Chief Complaint  Patient presents with  . Ovarian Mass    Follow up    Assessment/Plan:  72 year old s/p exploratory laparotomy with bilateral salpingo-oophorectomy by Dr. Nelly Rout on 02/20/13.  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 Meredith Gutierrez represented  with the report of frequent diarrhea and abdominal bloating.  KUB c/w ileus, WBC 16K.  Patient advised to have a low fiber diet.  On examination today she seems well.  Good BS, voluminous flatus, but poor appetite.  Cotinue probiotic supplements. Advance diet as tolerated. F/U prn      HPI:  Meredith Gutierrez is a 72 year old seen in consultation at the request of Dr. Alinda Deem regarding management of a newly diagnosed pelvic mass. Patient was undergoing evaluation for low back pain. An MRI was obtained showing a pelvic mass. Subsequently the patient had an ultrasound and a CT scan, which confirmed a pelvic mass. Ultrasound shows the mass measuring 10.6 x 8.7 x 10.4 cm. Most of it is cystic although there is some mural nodularity and irregularity with septations. CT scan shows no evidence of adenopathy or ascites. She does have a right hydronephrosis.  Patient's previously had a hysterectomy (abdominal through midline incision) and has no other gynecologic history.  On 02/20/13, she underwent an exploratory laparotomy with bilateral salpingo-oophorectomy by Dr. Nelly Rout.  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    She presented 03/01/2013  with complaints of intermittent early morning nausea and persistent diarrhea, decreased appetite, no emesis  no fever, chills, or vaginal bleeding.  No issues reported with her  incision.  Minimal pain reported.  Voiding without difficulty.    C dif stool cultures were obtained but are negative WBC was elevated to 16 repeat 15.  KUB c/w ileus.   Review of Systems  Constitutional: No fever, chills, Excellent appetite over the past two days. Pulmonary: No cough or wheeze.  Gastrointestinal: No nausea or vomiting, no diarrhea. Stool is formed. Genitourinary: No frequency, urgency, or dysuria.  Musculoskeletal: No myalgia or joint pain. Neurologic: No weakness, numbness, or change in gait.  Psychology: No depression, anxiety, or insomnia.   Vitals:  Blood pressure 128/88, pulse 86, temperature 99.1 F (37.3 C), temperature source Oral, resp. rate 16, height 5' 1.38" (1.559 m), weight 137 lb 8 oz (62.37 kg). Wt Readings from Last 3 Encounters:  03/20/13 137 lb 8 oz (62.37 kg)  03/06/13 137 lb 3.2 oz (62.234 kg)  02/20/13 142 lb 1.6 oz (64.456 kg)     Physical Exam:  General: Well developed, well nourished female in no acute distress. Alert and oriented x 3.   Looks fantastic Back: No CVA tenderness.  Abdomen: Abdomen mildly distended.  Non tympanitic, bowel sounds present.  Non tender, no guarding. Extremities: No bilateral cyanosis, edema, or clubbing.    [name],  03/20/2013, 1:22 PM

## 2013-04-02 ENCOUNTER — Telehealth (HOSPITAL_COMMUNITY): Payer: Self-pay | Admitting: *Deleted

## 2013-04-02 ENCOUNTER — Encounter (HOSPITAL_COMMUNITY): Payer: Self-pay | Admitting: *Deleted

## 2013-04-16 ENCOUNTER — Other Ambulatory Visit: Payer: Self-pay | Admitting: *Deleted

## 2013-04-16 MED ORDER — ENALAPRIL MALEATE 10 MG PO TABS: 10.0000 mg | ORAL_TABLET | Freq: Every day | ORAL | Status: DC

## 2013-04-19 ENCOUNTER — Ambulatory Visit (HOSPITAL_COMMUNITY)
Admission: RE | Admit: 2013-04-19 | Discharge: 2013-04-19 | Disposition: A | Discharge status: Home / Self Care | Payer: Medicare Other | Source: Ambulatory Visit | Attending: Cardiovascular Disease | Admitting: Cardiovascular Disease

## 2013-04-19 DIAGNOSIS — I701 Atherosclerosis of renal artery: Secondary | ICD-10-CM

## 2013-04-19 DIAGNOSIS — I722 Aneurysm of renal artery: Secondary | ICD-10-CM | POA: Insufficient documentation

## 2013-04-19 DIAGNOSIS — I10 Essential (primary) hypertension: Secondary | ICD-10-CM

## 2013-04-19 NOTE — Progress Notes (Signed)
Renal Duplex Completed. Nathanael Krist, BS, RDMS, RVT  

## 2013-04-30 ENCOUNTER — Other Ambulatory Visit: Payer: Self-pay | Admitting: *Deleted

## 2013-04-30 MED ORDER — HYDRALAZINE HCL 50 MG PO TABS: 50.0000 mg | ORAL_TABLET | Freq: Two times a day (BID) | ORAL | Status: DC

## 2013-04-30 NOTE — Telephone Encounter (Signed)
Rx was sent to pharmacy electronically. 

## 2013-05-25 ENCOUNTER — Telehealth: Payer: Self-pay | Admitting: Cardiovascular Disease

## 2013-05-25 NOTE — Telephone Encounter (Signed)
She is going to have surgery in January for hip replacement.She wants to know if she needs to come in to be cleared for surgery?

## 2013-05-25 NOTE — Telephone Encounter (Signed)
Returned call and pt verified x 2.  Pt informed per MD and asked for surgeon's name.  Pt stated she isn't sure, but the practice is [facility].  Pt has a name, but is unsure of writing.  Pt gave RN number to Lillia Abed who works w/ [employer], [phone] ext [phone].  Pt informed RN will call to get information and should not need to call her back unless further information needed from Dr. Royann Shivers.  Pt verbalized understanding and agreed w/ plan.  Call to [facility] and informed she will fax clearance for.  Fax number given to [employer].  Fax received and placed on Dr. Erin Hearing cart for signature.  Message forwarded to Dr. Royann Shivers.

## 2013-05-25 NOTE — Telephone Encounter (Signed)
Low risk for hip surgery. No need for appointment. Who is the surgeon? [facility]

## 2013-05-25 NOTE — Telephone Encounter (Signed)
Message forwarded to Dr. Royann Shivers for further instructions.

## 2013-05-27 NOTE — Telephone Encounter (Signed)
No need for appt. Letter is ready

## 2013-05-28 NOTE — Telephone Encounter (Signed)
Forwarded to B. Lassiter, CMA.  

## 2013-06-09 ENCOUNTER — Other Ambulatory Visit: Payer: Self-pay | Admitting: Cardiovascular Disease

## 2013-06-11 NOTE — Telephone Encounter (Signed)
Rx was sent to pharmacy electronically. 

## 2013-06-14 HISTORY — PX: TOTAL HIP ARTHROPLASTY: SHX124

## 2013-06-21 ENCOUNTER — Encounter: Payer: Self-pay | Admitting: Cardiovascular Disease

## 2013-06-22 ENCOUNTER — Telehealth: Payer: Self-pay | Admitting: *Deleted

## 2013-06-22 DIAGNOSIS — E782 Mixed hyperlipidemia: Secondary | ICD-10-CM

## 2013-06-22 DIAGNOSIS — Z79899 Other long term (current) drug therapy: Secondary | ICD-10-CM

## 2013-06-22 MED ORDER — FENOFIBRATE 145 MG PO TABS: 145.0000 mg | ORAL_TABLET | Freq: Every day | ORAL | Status: DC

## 2013-06-22 NOTE — Telephone Encounter (Signed)
Lab results from [place] given to patient.  Cholesterol and Tg too high, statin intolerant.  Per Dr. Loletha Grayer try Fenofibrate 145mg  qd.  Patient agreed to try.  New Rx sent to pharmacy.  Lab order for recheck in 3 months mailed to patient.

## 2013-07-04 ENCOUNTER — Telehealth: Payer: Self-pay | Admitting: Cardiovascular Disease

## 2013-07-04 NOTE — Telephone Encounter (Signed)
Confirmed what labs were ordered on patient, and told her it is okay to have labs drawn @ lab of her choosing.

## 2013-07-04 NOTE — Telephone Encounter (Signed)
Want to know if she can have her lab work in [employer] instead of [facility] please.

## 2013-09-10 ENCOUNTER — Telehealth: Payer: Self-pay | Admitting: Cardiovascular Disease

## 2013-09-10 NOTE — Telephone Encounter (Signed)
I found Barbara's note. Ok to stop fenofibrate. No other good option. JAARS

## 2013-09-10 NOTE — Telephone Encounter (Signed)
Returned call and pt verified x 2.  Pt stated she cannot take the cholesterol medicine Dr. Loletha Grayer put her on.  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  Stated she tried to take one before bed and she can't go to sleep.  Stated she is just shaking like crazy.  Stated Dr. Burnett Sheng called Dr. Loletha Grayer about her blood work and he put her on it.  Pt spelled name, fenofibrate.  Stated she just cannot take it.  Pt stated she hasn't been able to take any cholesterol med.  Pt uses [facility] and does NOT want a script sent w/o her being notified first to make sure she can take it.  Pt informed Dr. Loletha Grayer will be notified for further instructions and RN will call her back.  Pt verbalized understanding and agreed w/ plan.  Message forwarded to Dr. Sallyanne Kuster.

## 2013-09-10 NOTE — Telephone Encounter (Signed)
Returned call and informed pt per instructions by MD/PA.  Pt verbalized understanding and agreed w/ plan.  Pt advised to follow low fat and carb diet to help decrease cholesterol and triglycerides.  Pt verbalized understanding and agreed w/ plan.  Pt stated she has cut butter and mayo and is walking when she can (had hip replacement).

## 2013-09-10 NOTE — Telephone Encounter (Signed)
Did I (we) send in this Rx? I have no recollection of doing so, nor is there a record that I can find.

## 2013-09-10 NOTE — Telephone Encounter (Signed)
Please call-question about her Fenofibrate.

## 2013-09-13 ENCOUNTER — Telehealth: Payer: Self-pay | Admitting: Cardiovascular Disease

## 2013-09-13 ENCOUNTER — Other Ambulatory Visit (HOSPITAL_COMMUNITY): Payer: Self-pay | Admitting: Cardiovascular Disease

## 2013-09-13 DIAGNOSIS — R0989 Other specified symptoms and signs involving the circulatory and respiratory systems: Secondary | ICD-10-CM

## 2013-09-13 NOTE — Telephone Encounter (Signed)
I would wait for three months after stopping the medication, then recheck

## 2013-09-13 NOTE — Telephone Encounter (Signed)
Returned call and left message on pt-identified voicemail w/ instructions by MD/PA.  Also to call back tomorrow between 8am and 4pm.

## 2013-09-13 NOTE — Telephone Encounter (Signed)
Message forwarded to Dr. Sallyanne Kuster to advise if he wants repeat lipid check now that pt has dc'd fenofibrate.

## 2013-09-13 NOTE — Telephone Encounter (Signed)
Dr.Croitoru took off of the cholesterol medication and she wants to know if she should still have the lab work done .Marland Kitchen Please call   Thanks

## 2013-09-21 ENCOUNTER — Ambulatory Visit (HOSPITAL_COMMUNITY)
Admission: RE | Admit: 2013-09-21 | Discharge: 2013-09-21 | Disposition: A | Discharge status: Home / Self Care | Payer: Medicare HMO | Source: Ambulatory Visit | Attending: Internal Medicine | Admitting: Internal Medicine

## 2013-09-21 DIAGNOSIS — I6529 Occlusion and stenosis of unspecified carotid artery: Secondary | ICD-10-CM

## 2013-09-21 DIAGNOSIS — R0989 Other specified symptoms and signs involving the circulatory and respiratory systems: Secondary | ICD-10-CM | POA: Insufficient documentation

## 2013-09-21 NOTE — Progress Notes (Signed)
Carotid Duplex Completed. Miqueas Whilden, BS, RDMS, RVT  

## 2014-01-06 ENCOUNTER — Emergency Department (HOSPITAL_COMMUNITY)
Admission: EM | Admit: 2014-01-06 | Discharge: 2014-01-06 | Disposition: A | Discharge status: Home / Self Care | Payer: Medicare HMO | Attending: Emergency Medicine | Admitting: Emergency Medicine

## 2014-01-06 ENCOUNTER — Encounter (HOSPITAL_COMMUNITY): Payer: Self-pay | Admitting: Emergency Medicine

## 2014-01-06 DIAGNOSIS — Z791 Long term (current) use of non-steroidal anti-inflammatories (NSAID): Secondary | ICD-10-CM | POA: Diagnosis not present

## 2014-01-06 DIAGNOSIS — Z951 Presence of aortocoronary bypass graft: Secondary | ICD-10-CM | POA: Insufficient documentation

## 2014-01-06 DIAGNOSIS — I5030 Unspecified diastolic (congestive) heart failure: Secondary | ICD-10-CM | POA: Diagnosis not present

## 2014-01-06 DIAGNOSIS — Z87442 Personal history of urinary calculi: Secondary | ICD-10-CM | POA: Diagnosis not present

## 2014-01-06 DIAGNOSIS — M5431 Sciatica, right side: Secondary | ICD-10-CM

## 2014-01-06 DIAGNOSIS — Z880 Allergy status to penicillin: Secondary | ICD-10-CM | POA: Insufficient documentation

## 2014-01-06 DIAGNOSIS — I10 Essential (primary) hypertension: Secondary | ICD-10-CM | POA: Insufficient documentation

## 2014-01-06 DIAGNOSIS — Z9889 Other specified postprocedural states: Secondary | ICD-10-CM | POA: Diagnosis not present

## 2014-01-06 DIAGNOSIS — G5700 Lesion of sciatic nerve, unspecified lower limb: Secondary | ICD-10-CM | POA: Insufficient documentation

## 2014-01-06 DIAGNOSIS — M5430 Sciatica, unspecified side: Secondary | ICD-10-CM | POA: Insufficient documentation

## 2014-01-06 DIAGNOSIS — R300 Dysuria: Secondary | ICD-10-CM | POA: Insufficient documentation

## 2014-01-06 DIAGNOSIS — M549 Dorsalgia, unspecified: Secondary | ICD-10-CM | POA: Insufficient documentation

## 2014-01-06 DIAGNOSIS — Z79899 Other long term (current) drug therapy: Secondary | ICD-10-CM | POA: Insufficient documentation

## 2014-01-06 DIAGNOSIS — Z7982 Long term (current) use of aspirin: Secondary | ICD-10-CM | POA: Diagnosis not present

## 2014-01-06 DIAGNOSIS — F17200 Nicotine dependence, unspecified, uncomplicated: Secondary | ICD-10-CM | POA: Insufficient documentation

## 2014-01-06 DIAGNOSIS — Z85820 Personal history of malignant melanoma of skin: Secondary | ICD-10-CM | POA: Diagnosis not present

## 2014-01-06 DIAGNOSIS — F411 Generalized anxiety disorder: Secondary | ICD-10-CM | POA: Diagnosis not present

## 2014-01-06 DIAGNOSIS — I2510 Atherosclerotic heart disease of native coronary artery without angina pectoris: Secondary | ICD-10-CM | POA: Diagnosis not present

## 2014-01-06 DIAGNOSIS — IMO0002 Reserved for concepts with insufficient information to code with codable children: Secondary | ICD-10-CM | POA: Diagnosis not present

## 2014-01-06 DIAGNOSIS — J4489 Other specified chronic obstructive pulmonary disease: Secondary | ICD-10-CM | POA: Insufficient documentation

## 2014-01-06 DIAGNOSIS — J449 Chronic obstructive pulmonary disease, unspecified: Secondary | ICD-10-CM | POA: Diagnosis not present

## 2014-01-06 DIAGNOSIS — G5701 Lesion of sciatic nerve, right lower limb: Secondary | ICD-10-CM

## 2014-01-06 LAB — URINALYSIS, ROUTINE W REFLEX MICROSCOPIC
Bilirubin Urine: NEGATIVE
GLUCOSE, UA: NEGATIVE mg/dL
Hgb urine dipstick: NEGATIVE
Ketones, ur: NEGATIVE mg/dL
Leukocytes, UA: NEGATIVE
Nitrite: NEGATIVE
PH: 6.5 (ref 5.0–8.0)
Protein, ur: NEGATIVE mg/dL
Specific Gravity, Urine: 1.007 (ref 1.005–1.030)
Urobilinogen, UA: 0.2 mg/dL (ref 0.0–1.0)

## 2014-01-06 MED ORDER — METHYLPREDNISOLONE 4 MG PO KIT
PACK | ORAL | Status: DC
Start: 2014-01-06 — End: 2014-02-13

## 2014-01-06 MED ORDER — OXYCODONE-ACETAMINOPHEN 5-325 MG PO TABS
2.0000 | ORAL_TABLET | Freq: Once | ORAL | Status: AC
  Administered 2014-01-06: 2 via ORAL
  Filled 2014-01-06: qty 2

## 2014-01-06 MED ORDER — HYDROCODONE-ACETAMINOPHEN 5-325 MG PO TABS: 1.0000 | ORAL_TABLET | ORAL | Status: DC | PRN

## 2014-01-06 NOTE — Discharge Instructions (Signed)
Physical therapy referral--call to make appointment.  Piriformis Syndrome with Rehab Piriformis syndrome is a condition the affects the nervous system in the area of the hip, and is characterized by pain and possibly a loss of feeling in the backside (posterior) thigh that may extend down the entire length of the leg. The symptoms are caused by an increase in pressure on the sciatic nerve by the piriformis muscle, which is on the back of the hip and is responsible for externally rotating the hip. The sciatic nerve and its branches connect to much of the leg. Normally the sciatic nerve runs between the piriformis muscle and other muscles. However, in certain individuals the nerve runs through the muscle, which causes an increase in pressure on the nerve and results in the symptoms of piriformis syndrome. SYMPTOMS   Pain, tingling, numbness, or burning in the back of the thigh that may also extend down the entire leg.  Occasionally, tenderness in the buttock.  Loss of function of the leg.  Pain that worsens when using the piriformis muscle (running, jumping, or stairs).  Pain that increases with prolonged sitting.  Pain that is lessened by lying flat on the back. CAUSES   Piriformis syndrome is the result of an increase in pressure placed on the sciatic nerve. Oftentimes, piriformis syndrome is an overuse injury.  Stress placed on the nerve from a sudden increase in the intensity, frequency, or duration of training.  Compensation of other extremity injuries. RISK INCREASES WITH:  Sports that involve the piriformis muscle (running, walking, or jumping).  You are born with (congenital) a defect in which the sciatic nerve passes through the muscle. PREVENTION  Warm up and stretch properly before activity.  Allow for adequate recovery between workouts.  Maintain physical fitness:  Strength, flexibility, and endurance.  Cardiovascular fitness. PROGNOSIS  If treated properly, the  symptoms of piriformis syndrome usually resolve in 2 to 6 weeks. RELATED COMPLICATIONS   Persistent and possibly permanent pain and numbness in the lower extremity.  Weakness of the extremity that may progress to disability and inability to compete. TREATMENT  The most effective treatment for piriformis syndrome is rest from any activities that aggravate the symptoms. Ice and pain medication may help reduce pain and inflammation. The use of strengthening and stretching exercises may help reduce pain with activity. These exercises may be performed at home or with a therapist. A referral to a therapist may be given for further evaluation and treatment, such as ultrasound. Corticosteroid injections may be given to reduce inflammation that is causing pressure to be placed on the sciatic nerve. If nonsurgical (conservative) treatment is unsuccessful, then surgery may be recommended.  MEDICATION   If pain medication is necessary, then nonsteroidal anti-inflammatory medications, such as aspirin and ibuprofen, or other minor pain relievers, such as acetaminophen, are often recommended.  Do not take pain medication for 7 days before surgery.  Prescription pain relievers may be given if deemed necessary by your caregiver. Use only as directed and only as much as you need.  Corticosteroid injections may be given by your caregiver. These injections should be reserved for the most serious cases, because they may only be given a certain number of times. HEAT AND COLD:   Cold treatment (icing) relieves pain and reduces inflammation. Cold treatment should be applied for 10 to 15 minutes every 2 to 3 hours for inflammation and pain and immediately after any activity that aggravates your symptoms. Use ice packs or massage the area with a  piece of ice (ice massage).  Heat treatment may be used prior to performing the stretching and strengthening activities prescribed by your caregiver, physical therapist, or  athletic trainer. Use a heat pack or soak the injury in warm water. SEEK IMMEDIATE MEDICAL CARE IF:  Treatment seems to offer no benefit, or the condition worsens.  Any medications produce adverse side effects. Sciatica Sciatica is pain, weakness, numbness, or tingling along your sciatic nerve. The nerve starts in the lower back and runs down the back of each leg. Nerve damage or certain conditions pinch or put pressure on the sciatic nerve. This causes the pain, weakness, and other discomforts of sciatica. HOME CARE   Only take medicine as told by your doctor.  Apply ice to the affected area for 20 minutes. Do this 3-4 times a day for the first 48-72 hours. Then try heat in the same way.  Exercise, stretch, or do your usual activities if these do not make your pain worse.  Go to physical therapy as told by your doctor.  Keep all doctor visits as told.  Do not wear high heels or shoes that are not supportive.  Get a firm mattress if your mattress is too soft to lessen pain and discomfort. GET HELP RIGHT AWAY IF:   You cannot control when you poop (bowel movement) or pee (urinate).  You have more weakness in your lower back, lower belly (pelvis), butt (buttocks), or legs.  You have redness or puffiness (swelling) of your back.  You have a burning feeling when you pee.  You have pain that gets worse when you lie down.  You have pain that wakes you from your sleep.  Your pain is worse than past pain.  Your pain lasts longer than 4 weeks.  You are suddenly losing weight without reason. MAKE SURE YOU:   Understand these instructions.  Will watch this condition.  Will get help right away if you are not doing well or get worse. Document Released: 03/09/2008 Document Revised: 11/30/2011 Document Reviewed: 10/10/2011 [facility] Patient Information 2015 [place], [place]. This information is not intended to replace advice given to you by your health care provider. Make sure you  discuss any questions you have with your health care provider.

## 2014-01-06 NOTE — ED Notes (Signed)
Bed: WA21 Expected date:  Expected time:  Means of arrival:  Comments: 

## 2014-01-06 NOTE — ED Provider Notes (Signed)
CSN: [id number]     Arrival date & time 01/06/14  1441 History   First MD Initiated Contact with Patient 01/06/14 1515     Chief Complaint  Patient presents with  . Dysuria  . Back Pain     HPI  Pt c/o Rt buttock pain.  Tender to palpate.  Worse with walking, or prolonged sitting.  Non radiating.  No radicular pain, or LE weakness. No fall or unusual activity.  Has decreased ROM or LLE after THA last year.  "Walks funny".  Past Medical History  Diagnosis Date  . Hypertension   . Coronary artery disease   . Anxiety   . Diastolic dysfunction 10/06/9561  . Diastolic CHF, on admit treated. 08/10/2011  . Cerebral atherosclerosis   . Angina     never needed to take nitroglycerin.  . Sinus congestion     occ. tx. with Loratadine  . History of kidney stones 02-16-13    "hx. of overgrowth of muscle-causing some stricture"renal artery stenosis-being followed [facility]  . Cancer     skin cancers, 1 melanoma removed back -3 months ago  . COPD (chronic obstructive pulmonary disease)     02-16-13-"pt. denies this"-shows on CXR of 2- 2013.   Past Surgical History  Procedure Laterality Date  . Abdominal hysterectomy    . Appendectomy    . Cesarean section    . Cardiac catheterization  08/09/2011    no sign. coronary obstruction. LIMA is atretic w/excellent flow down the native LAD  . Cardiac catheterization  08/22/2001    no sign. coronary stenosis,  . Nm myocar perf wall motion  07/20/2010    normal  . Cataract extraction, bilateral Bilateral   . Coronary artery bypass graft  02-16-13    3'03-no bypasses -Had 2 aneurysm repairs(stents used)  . Laparotomy N/A 02/20/2013    Procedure: EXPLORATORY LAPAROTOMY;  Surgeon: [name], MD;  Location: [facility];  Service: Gynecology;  Laterality: N/A;  . Salpingoophorectomy Bilateral 02/20/2013    Procedure: BILATERAL SALPINGO OOPHORECTOMY;  Surgeon: [name], MD;  Location: [facility];  Service: Gynecology;  Laterality: Bilateral;   Family History    Problem Relation Age of Onset  . Stroke Mother   . Hypotension Mother   . Coronary artery disease Father   . Hypotension Father   . Breast cancer Maternal Aunt   . Hypotension Sister   . Hypotension Brother    History  Substance Use Topics  . Smoking status: Current Every Day Smoker -- 0.10 packs/day for 20 years    Types: Cigarettes  . Smokeless tobacco: Never Used     Comment: smoking cessation info given  . Alcohol Use: No   OB History   Grav Para Term Preterm Abortions TAB SAB Ect Mult Living                 Review of Systems  Constitutional: Negative for fever, chills, diaphoresis, appetite change and fatigue.  HENT: Negative for mouth sores, sore throat and trouble swallowing.   Eyes: Negative for visual disturbance.  Respiratory: Negative for cough, chest tightness, shortness of breath and wheezing.   Cardiovascular: Negative for chest pain.  Gastrointestinal: Negative for nausea, vomiting, abdominal pain, diarrhea and abdominal distention.  Endocrine: Negative for polydipsia, polyphagia and polyuria.  Genitourinary: Negative for dysuria, frequency and hematuria.  Musculoskeletal: Positive for arthralgias, back pain, gait problem and myalgias.  Skin: Negative for color change, pallor and rash.  Neurological: Negative for dizziness, syncope, light-headedness and headaches.  Hematological: Does not bruise/bleed easily.  Psychiatric/Behavioral: Negative for behavioral problems and confusion.      Allergies  Codeine; Crestor; Lopressor; Penicillins; Prednisone; and Zetia  Home Medications   Prior to Admission medications   Medication Sig Start Date End Date Taking? Authorizing Provider  ALPRAZolam (NIRAVAM) 0.25 MG dissolvable tablet Take 0.25 mg by mouth 2 (two) times daily. Takes every day per patient   Yes Historical Provider, MD  aspirin EC 81 MG tablet Take 81 mg by mouth daily.   Yes Historical Provider, MD  diltiazem (DILTIAZEM CD) 120 MG 24 hr capsule  Take 120 mg by mouth daily.   Yes Historical Provider, MD  enalapril (VASOTEC) 10 MG tablet Take 1 tablet (10 mg total) by mouth daily. 04/16/13  Yes Mihai Croitoru, MD  hydrALAZINE (APRESOLINE) 50 MG tablet Take 1 tablet (50 mg total) by mouth 2 (two) times daily. 04/30/13  Yes Mihai Croitoru, MD  ibuprofen (ADVIL,MOTRIN) 200 MG tablet Take 400 mg by mouth every 6 (six) hours as needed for moderate pain.   Yes Historical Provider, MD  meloxicam (MOBIC) 7.5 MG tablet Take 15 mg by mouth daily.    Yes Historical Provider, MD  nitroGLYCERIN (NITROSTAT) 0.4 MG SL tablet Place 1 tablet (0.4 mg total) under the tongue every 5 (five) minutes as needed for chest pain. 03/19/13  Yes Mihai Croitoru, MD  HYDROcodone-acetaminophen (NORCO/VICODIN) 5-325 MG per tablet Take 1 tablet by mouth every 4 (four) hours as needed. 01/06/14   Tanna Furry, MD  methylPREDNISolone (MEDROL DOSEPAK) 4 MG tablet 6 po on day 1, decrease by 1 tab per day 01/06/14   Tanna Furry, MD   BP 128/72  Pulse 61  Temp(Src) 97.5 F (36.4 C) (Oral)  Resp 19  SpO2 100% Physical Exam  Constitutional: She is oriented to person, place, and time. She appears well-developed and well-nourished. No distress.  HENT:  Head: Normocephalic.  Eyes: Conjunctivae are normal. Pupils are equal, round, and reactive to light. No scleral icterus.  Neck: Normal range of motion. Neck supple. No thyromegaly present.  Cardiovascular: Normal rate and regular rhythm.  Exam reveals no gallop and no friction rub.   No murmur heard. Pulmonary/Chest: Effort normal and breath sounds normal. No respiratory distress. She has no wheezes. She has no rales.  Abdominal: Soft. Bowel sounds are normal. She exhibits no distension. There is no tenderness. There is no rebound.  Musculoskeletal: Normal range of motion.       Back:  TTP at sciatic notch.  + Piriformis pain.Dcereased ROM to Extend Lt hip c tight lt hamstring.  Neurological: She is alert and oriented to person,  place, and time.  Normal symmetric Strength to shoulder shrug, triceps, biceps, grip,wrist flex/extend,and intrinsics  Norma lsymmetric sensation above and below clavicles, and to all distributions to UEs. Norma symmetric strength to flex/.extend hip and knees, dorsi/plantar flex ankles. Normal symmetric sensation to all distributions to LEs Patellar and achilles reflexes 1-2+. Downgoing Babinski   Skin: Skin is warm and dry. No rash noted.  Psychiatric: She has a normal mood and affect. Her behavior is normal.    ED Course  Procedures (including critical care time) Labs Review Labs Reviewed  URINALYSIS, ROUTINE W REFLEX MICROSCOPIC - Abnormal; Notable for the following:    Color, Urine STRAW (*)    All other components within normal limits    Imaging Review No results found.   EKG Interpretation None      MDM   Final diagnoses:  Piriformis  syndrome, right  Sciatica, right    Pt with + Piriformis sign.  Sciatica with pain limited to buttock.  Decreased rom to LLE s/p recent Lt THA, and secondary abnormal gait.  PT referral given.    Tanna Furry, MD 01/06/14 [phone]

## 2014-01-06 NOTE — ED Notes (Signed)
Pt reports lower back pain and urinary pressure since Wednesday with decreased urinary output. Reports pain 10/10

## 2014-02-11 ENCOUNTER — Telehealth: Payer: Self-pay | Admitting: Cardiovascular Disease

## 2014-02-11 MED ORDER — DILTIAZEM HCL ER COATED BEADS 120 MG PO CP24: 120.0000 mg | ORAL_CAPSULE | Freq: Every day | ORAL | Status: DC

## 2014-02-11 NOTE — Telephone Encounter (Signed)
Pt still have not received her Diltiazem.Please call it in today to [facility] [phone].

## 2014-02-11 NOTE — Telephone Encounter (Signed)
Rx was sent to pharmacy electronically. 

## 2014-02-13 ENCOUNTER — Encounter: Payer: Self-pay | Admitting: Cardiovascular Disease

## 2014-02-13 ENCOUNTER — Ambulatory Visit (INDEPENDENT_AMBULATORY_CARE_PROVIDER_SITE_OTHER): Payer: Commercial Managed Care - HMO | Admitting: Cardiovascular Disease

## 2014-02-13 VITALS — BP 132/80 | HR 75 | Resp 16 | Ht 62.0 in | Wt 134.5 lb

## 2014-02-13 DIAGNOSIS — Z720 Tobacco use: Secondary | ICD-10-CM

## 2014-02-13 DIAGNOSIS — I25810 Atherosclerosis of coronary artery bypass graft(s) without angina pectoris: Secondary | ICD-10-CM

## 2014-02-13 DIAGNOSIS — E785 Hyperlipidemia, unspecified: Secondary | ICD-10-CM

## 2014-02-13 DIAGNOSIS — F17200 Nicotine dependence, unspecified, uncomplicated: Secondary | ICD-10-CM

## 2014-02-13 DIAGNOSIS — I10 Essential (primary) hypertension: Secondary | ICD-10-CM

## 2014-02-13 NOTE — [place] Notes (Signed)
Patient ID: Meredith Gutierrez, female   DOB: 1940-06-27, 73 y.o.   MRN: [id number]     Reason for office visit CAD status post CABG  Meredith Gutierrez has not had any cardiac complaints during the last year. 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 She did not have any cardiac problems during those surgeries. Her  She had bypass surgery in 2003 (LIMA to LAD and SVG to first diagonal artery). 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 She has not had serious cardiac event since that time. Cardiac catheterization and coronary angiography was performed in 2013. This showed that the LIMA bypass was atretic, but there was excellent flow down both the native LAD artery and the SVG to the diagonal artery. No new coronary lesions were noted. She has been chest pain-free since that time. She has hyperdynamic left ventricular systolic function. She has never had congestive heart failure.  She has been intolerant to multiple attempts at lipid lowering therapy including simvastatin, Crestor, Vytorin and Zetia monotherapy. She has not tolerated beta blockers secondary to bradycardia.   Allergies  Allergen Reactions  . Codeine     Shortness of breath  . Crestor [Rosuvastatin Calcium]     Weakness with statins  . Lopressor [Metoprolol Tartrate]     Bradycardia   . Penicillins     Shortness of breath  . Prednisone     Shortness of breath  . Zetia [Ezetimibe]     Weakness     Current Outpatient Prescriptions  Medication Sig Dispense Refill  . ALPRAZolam (NIRAVAM) 0.25 MG dissolvable tablet Take 0.25 mg by mouth 2 (two) times daily. Takes every day per patient      . aspirin EC 81 MG tablet Take 81 mg by mouth daily.      Marland Kitchen diltiazem (DILTIAZEM CD) 120 MG 24 hr capsule Take 1 capsule (120 mg total) by mouth daily.  30 capsule  0  . enalapril (VASOTEC) 10 MG tablet Take 10 mg by mouth 2 (two) times daily.      . hydrALAZINE  (APRESOLINE) 50 MG tablet Take 1 tablet (50 mg total) by mouth 2 (two) times daily.  60 tablet  8  . meloxicam (MOBIC) 7.5 MG tablet Take 15 mg by mouth daily.       . nitroGLYCERIN (NITROSTAT) 0.4 MG SL tablet Place 1 tablet (0.4 mg total) under the tongue every 5 (five) minutes as needed for chest pain.  25 tablet  4   No current facility-administered medications for this visit.    Past Medical History  Diagnosis Date  . Hypertension   . Coronary artery disease   . Anxiety   . Diastolic dysfunction 4/56/2563  . Diastolic CHF, on admit treated. 08/10/2011  . Cerebral atherosclerosis   . Angina     never needed to take nitroglycerin.  . Sinus congestion     occ. tx. with Loratadine  . History of kidney stones 02-16-13    "hx. of overgrowth of muscle-causing some stricture"renal artery stenosis-being followed [facility]  . Cancer     skin cancers, 1 melanoma removed back -3 months ago  . COPD (chronic obstructive pulmonary disease)     02-16-13-"pt. denies this"-shows on CXR of 2- 2013.    Past Surgical History  Procedure Laterality Date  . Abdominal hysterectomy    . Appendectomy    . Cesarean section    .  Cardiac catheterization  08/09/2011    no sign. coronary obstruction. LIMA is atretic w/excellent flow down the native LAD  . Cardiac catheterization  08/22/2001    no sign. coronary stenosis,  . Nm myocar perf wall motion  07/20/2010    normal  . Cataract extraction, bilateral Bilateral   . Coronary artery bypass graft  02-16-13    3'03-no bypasses -Had 2 aneurysm repairs(stents used)  . Laparotomy N/A 02/20/2013    Procedure: EXPLORATORY LAPAROTOMY;  Surgeon: [name], MD;  Location: [facility];  Service: Gynecology;  Laterality: N/A;  . Salpingoophorectomy Bilateral 02/20/2013    Procedure: BILATERAL SALPINGO OOPHORECTOMY;  Surgeon: [name], MD;  Location: [facility];  Service: Gynecology;  Laterality: Bilateral;    Family History  Problem Relation Age of Onset  . Stroke Mother    . Hypotension Mother   . Coronary artery disease Father   . Hypotension Father   . Breast cancer Maternal Aunt   . Hypotension Sister   . Hypotension Brother     History   Social History  . Marital Status: Widowed    Spouse Name: N/A    Number of Children: N/A  . Years of Education: N/A   Occupational History  . Not on file.   Social History Main Topics  . Smoking status: Current Every Day Smoker -- 0.10 packs/day for 20 years    Types: Cigarettes  . Smokeless tobacco: Never Used     Comment: smoking cessation info given  . Alcohol Use: No  . Drug Use: No  . Sexual Activity: No   Other Topics Concern  . Not on file   Social History Narrative  . No narrative on file    Review of systems: The patient specifically denies any chest pain at rest or with exertion, dyspnea at rest or with exertion, orthopnea, paroxysmal nocturnal dyspnea, syncope, palpitations, focal neurological deficits, intermittent claudication, lower extremity edema, unexplained weight gain, cough, hemoptysis or wheezing.  The patient also denies abdominal pain, nausea, vomiting, dysphagia, diarrhea, constipation, polyuria, polydipsia, dysuria, hematuria, frequency, urgency, abnormal bleeding or bruising, fever, chills, unexpected weight changes, mood swings, change in skin or hair texture, change in voice quality, auditory or visual problems, allergic reactions or rashes, new musculoskeletal complaints other than usual "aches and pains".   PHYSICAL EXAM BP 132/80  Pulse 75  Resp 16  Ht 5\' 2"  (1.575 m)  Wt 134 lb 8 oz (61.009 kg)  BMI 24.59 kg/m2  General: Alert, oriented x3, no distress Head: no evidence of trauma, PERRL, EOMI, no exophtalmos or lid lag, no myxedema, no xanthelasma; normal ears, nose and oropharynx Neck: normal jugular venous pulsations and no hepatojugular reflux; brisk carotid pulses without delay and no carotid bruits Chest: clear to auscultation, no signs of consolidation by  percussion or palpation, normal fremitus, symmetrical and full respiratory excursions Cardiovascular: normal position and quality of the apical impulse, regular rhythm, normal first and second heart sounds, no murmurs, rubs or gallops Abdomen: no tenderness or distention, no masses by palpation, no abnormal pulsatility or arterial bruits, normal bowel sounds, no hepatosplenomegaly Extremities: no clubbing, cyanosis or edema; 2+ radial, ulnar and brachial pulses bilaterally; 2+ right femoral, posterior tibial and dorsalis pedis pulses; 2+ left femoral, posterior tibial and dorsalis pedis pulses; no subclavian or femoral bruits Neurological: grossly nonfocal   EKG: Normal sinus rhythm, possible left atrial abnormality, Q waves in leads 3 and F. suggestive of possible old inferior infarction  Lipid Panel (while on Zetia monotherapy)  Component Value Date/Time   CHOL 187 08/06/2011 0046   TRIG 156* 08/06/2011 0046   HDL 35* 08/06/2011 0046   CHOLHDL 5.3 08/06/2011 0046   VLDL 31 08/06/2011 0046   LDLCALC 121* 08/06/2011 0046    November 2013 after stopping statins Total cholesterol 215, triglycerides 104, HDL 44, LDL 150  BMET    Component Value Date/Time   NA 140 03/01/2013 1308   NA 135 02/24/2013 0414   NA 139 02/08/2013 0901   K 4.1 03/01/2013 1308   K 4.6 02/24/2013 0414   CL 102 02/24/2013 0414   CO2 24 03/01/2013 1308   CO2 30 02/24/2013 0414   GLUCOSE 110 03/01/2013 1308   GLUCOSE 112* 02/24/2013 0414   GLUCOSE 92 02/08/2013 0901   BUN 13.5 03/01/2013 1308   BUN 8 02/24/2013 0414   BUN 17 02/08/2013 0901   CREATININE 0.7 03/01/2013 1308   CREATININE 0.53 02/24/2013 0414   CALCIUM 9.7 03/01/2013 1308   CALCIUM 9.4 02/24/2013 0414   GFRNONAA >90 02/24/2013 0414   GFRAA >90 02/24/2013 0414     ASSESSMENT AND PLAN  CAD (coronary artery disease), CABG 08/24/2001, off pump, LIMA to LAD, SVG to 1st diagonal. Ligation of LAD artery aneurysm.  Asymptomatic. Low risk anatomy by coronary  angiography in 2013. Normal perfusion 2012.  HTN (hypertension) Good control.   Dyslipidemia, statin intolerant She has mildly elevated LDL cholesterol. While it will be highly desirable to treat this to prevent coronary disease progression, she has been intolerant to multiple lipid-lowering agents in the past. Will try to see if she can get Praluent. I believe she has heterozygous familial hypercholesterolemia.  Tobacco abuse  She is a very light cigarette smoker. She only smokes 2 cigarettes everyday in the morning with her 3 cups of coffee. I have encouraged her to quit smoking altogether, but she politely refuses, as she has done before.    Meds ordered this encounter  Medications  . enalapril (VASOTEC) 10 MG tablet    Sig: Take 10 mg by mouth 2 (two) times daily.    Holli Humbles, MD, [place] [phone] office [phone] pager

## 2014-02-13 NOTE — Patient Instructions (Signed)
Your physician recommends that you schedule a follow-up appointment in: 1 Year  

## 2014-02-14 ENCOUNTER — Telehealth: Payer: Self-pay | Admitting: *Deleted

## 2014-02-14 NOTE — Telephone Encounter (Signed)
LM for patient to call.  Has not filled out all the paper work for [employer].  Need to either mail or if patient is coming to [place] in the near future to fill out.

## 2014-02-14 NOTE — Telephone Encounter (Signed)
Will mail paper work than needs to be filled out for [employer].  Patient will fill out and mail back.

## 2014-02-21 ENCOUNTER — Telehealth: Payer: Self-pay | Admitting: Cardiovascular Disease

## 2014-02-21 NOTE — Telephone Encounter (Signed)
Does not want Praluent.

## 2014-02-21 NOTE — Telephone Encounter (Signed)
Pt says she does not want to give herself the shot that Dr C had discussed.

## 2014-02-21 NOTE — Telephone Encounter (Signed)
Dr. Loletha Grayer. Please review and make recommendations.

## 2014-03-05 ENCOUNTER — Telehealth: Payer: Self-pay | Admitting: Cardiovascular Disease

## 2014-03-05 MED ORDER — HYDRALAZINE HCL 50 MG PO TABS: 50.0000 mg | ORAL_TABLET | Freq: Two times a day (BID) | ORAL | Status: DC

## 2014-03-05 NOTE — Telephone Encounter (Signed)
Pt called stating that she has been out of her Hydralzine for 2 days and would like it called in to the pharmacy. Please call  Thanks

## 2014-03-05 NOTE — Telephone Encounter (Signed)
Rx was sent to pharmacy electronically. 

## 2014-03-12 ENCOUNTER — Telehealth: Payer: Self-pay | Admitting: Cardiovascular Disease

## 2014-03-12 MED ORDER — DILTIAZEM HCL ER COATED BEADS 120 MG PO CP24: 120.0000 mg | ORAL_CAPSULE | Freq: Every day | ORAL | Status: DC

## 2014-03-12 NOTE — Telephone Encounter (Signed)
Pt still have not received her Diltiazem. Please call it in to [place] [facility] [phone].

## 2014-03-12 NOTE — Telephone Encounter (Signed)
Rx was sent to pharmacy electronically. 

## 2014-04-10 ENCOUNTER — Other Ambulatory Visit (HOSPITAL_COMMUNITY): Payer: Self-pay | Admitting: Cardiovascular Disease

## 2014-04-10 DIAGNOSIS — I701 Atherosclerosis of renal artery: Secondary | ICD-10-CM

## 2014-04-12 ENCOUNTER — Encounter (HOSPITAL_COMMUNITY): Payer: Self-pay | Admitting: *Deleted

## 2014-04-23 ENCOUNTER — Ambulatory Visit (HOSPITAL_COMMUNITY)
Admission: RE | Admit: 2014-04-23 | Discharge: 2014-04-23 | Disposition: A | Discharge status: Home / Self Care | Payer: Medicare HMO | Source: Ambulatory Visit | Attending: Cardiovascular Disease | Admitting: Cardiovascular Disease

## 2014-04-23 DIAGNOSIS — I701 Atherosclerosis of renal artery: Secondary | ICD-10-CM | POA: Diagnosis present

## 2014-04-23 NOTE — [facility] Notes (Signed)
Renal Artery Duplex Completed. °Brianna L Mazza,RVT °

## 2014-04-26 ENCOUNTER — Telehealth: Payer: Self-pay | Admitting: *Deleted

## 2014-04-26 DIAGNOSIS — Z79899 Other long term (current) drug therapy: Secondary | ICD-10-CM

## 2014-04-26 NOTE — Telephone Encounter (Signed)
Returning your call. °

## 2014-04-26 NOTE — Telephone Encounter (Signed)
Meredith Gutierrez requested I talk to her daughter,Meredith Gutierrez, and give her the results but she doesn't remember everything.  Called and gave Meredith Gutierrez Doppler results and need for labs now and again in 6 months.  Voiced understanding.

## 2014-04-26 NOTE — Telephone Encounter (Signed)
-----   Message from Sanda Klein, MD sent at 04/26/2014  9:11 AM EST ----- Left renal artery a little narrower, but as long as BP controlled and renal function stable, no need for intervention. Needs a BMET Q 48months. Do not see one in Epic. Please order unless done by PCP at outside lab

## 2014-04-26 NOTE — Telephone Encounter (Signed)
Patient notified of doppler results and needs a BMET q6 months.  Orders mailed to patient and she will probably have done at her PCP's office.

## 2014-05-01 ENCOUNTER — Telehealth: Payer: Self-pay | Admitting: Cardiovascular Disease

## 2014-05-01 NOTE — Telephone Encounter (Signed)
She wants to talk to you about why she have to have BMP? She wants to know if there is something wrong with her?If she is not there,please leave her a message.

## 2014-05-01 NOTE — Telephone Encounter (Signed)
LM to explain why she needs BMP every 6 months to keep a check on her kidney function due to the meds she is on. To call back if additional questions.

## 2014-05-14 ENCOUNTER — Encounter: Payer: Self-pay | Admitting: Cardiovascular Disease

## 2014-05-23 ENCOUNTER — Encounter (HOSPITAL_COMMUNITY): Payer: Self-pay | Admitting: Cardiovascular Disease

## 2014-06-19 DIAGNOSIS — R05 Cough: Secondary | ICD-10-CM | POA: Diagnosis not present

## 2014-06-19 DIAGNOSIS — R0602 Shortness of breath: Secondary | ICD-10-CM | POA: Diagnosis not present

## 2014-07-18 DIAGNOSIS — Z96649 Presence of unspecified artificial hip joint: Secondary | ICD-10-CM | POA: Diagnosis not present

## 2014-07-18 DIAGNOSIS — M7062 Trochanteric bursitis, left hip: Secondary | ICD-10-CM | POA: Diagnosis not present

## 2014-08-23 DIAGNOSIS — H2640 Unspecified secondary cataract: Secondary | ICD-10-CM | POA: Diagnosis not present

## 2014-09-02 DIAGNOSIS — H43811 Vitreous degeneration, right eye: Secondary | ICD-10-CM | POA: Diagnosis not present

## 2014-09-23 DIAGNOSIS — H43811 Vitreous degeneration, right eye: Secondary | ICD-10-CM | POA: Diagnosis not present

## 2014-12-02 DIAGNOSIS — I10 Essential (primary) hypertension: Secondary | ICD-10-CM | POA: Diagnosis not present

## 2014-12-02 DIAGNOSIS — R946 Abnormal results of thyroid function studies: Secondary | ICD-10-CM | POA: Diagnosis not present

## 2014-12-02 DIAGNOSIS — F17200 Nicotine dependence, unspecified, uncomplicated: Secondary | ICD-10-CM | POA: Diagnosis not present

## 2014-12-02 DIAGNOSIS — E780 Pure hypercholesterolemia: Secondary | ICD-10-CM | POA: Diagnosis not present

## 2014-12-02 DIAGNOSIS — Z0000 Encounter for general adult medical examination without abnormal findings: Secondary | ICD-10-CM | POA: Diagnosis not present

## 2014-12-02 DIAGNOSIS — Z23 Encounter for immunization: Secondary | ICD-10-CM | POA: Diagnosis not present

## 2014-12-02 DIAGNOSIS — M5136 Other intervertebral disc degeneration, lumbar region: Secondary | ICD-10-CM | POA: Diagnosis not present

## 2014-12-02 DIAGNOSIS — Z79899 Other long term (current) drug therapy: Secondary | ICD-10-CM | POA: Diagnosis not present

## 2014-12-03 DIAGNOSIS — Z23 Encounter for immunization: Secondary | ICD-10-CM | POA: Diagnosis not present

## 2014-12-03 DIAGNOSIS — Z0000 Encounter for general adult medical examination without abnormal findings: Secondary | ICD-10-CM | POA: Diagnosis not present

## 2014-12-03 DIAGNOSIS — F17200 Nicotine dependence, unspecified, uncomplicated: Secondary | ICD-10-CM | POA: Diagnosis not present

## 2014-12-03 DIAGNOSIS — I10 Essential (primary) hypertension: Secondary | ICD-10-CM | POA: Diagnosis not present

## 2014-12-03 DIAGNOSIS — M5136 Other intervertebral disc degeneration, lumbar region: Secondary | ICD-10-CM | POA: Diagnosis not present

## 2014-12-03 DIAGNOSIS — Z79899 Other long term (current) drug therapy: Secondary | ICD-10-CM | POA: Diagnosis not present

## 2014-12-03 DIAGNOSIS — E780 Pure hypercholesterolemia: Secondary | ICD-10-CM | POA: Diagnosis not present

## 2014-12-03 DIAGNOSIS — R946 Abnormal results of thyroid function studies: Secondary | ICD-10-CM | POA: Diagnosis not present

## 2014-12-23 DIAGNOSIS — Z0000 Encounter for general adult medical examination without abnormal findings: Secondary | ICD-10-CM | POA: Diagnosis not present

## 2014-12-23 DIAGNOSIS — I10 Essential (primary) hypertension: Secondary | ICD-10-CM | POA: Diagnosis not present

## 2014-12-23 DIAGNOSIS — F17200 Nicotine dependence, unspecified, uncomplicated: Secondary | ICD-10-CM | POA: Diagnosis not present

## 2014-12-23 DIAGNOSIS — E780 Pure hypercholesterolemia: Secondary | ICD-10-CM | POA: Diagnosis not present

## 2015-01-06 DIAGNOSIS — E041 Nontoxic single thyroid nodule: Secondary | ICD-10-CM | POA: Diagnosis not present

## 2015-01-07 DIAGNOSIS — E0590 Thyrotoxicosis, unspecified without thyrotoxic crisis or storm: Secondary | ICD-10-CM | POA: Diagnosis not present

## 2015-01-07 DIAGNOSIS — E041 Nontoxic single thyroid nodule: Secondary | ICD-10-CM | POA: Diagnosis not present

## 2015-01-14 DIAGNOSIS — E042 Nontoxic multinodular goiter: Secondary | ICD-10-CM | POA: Diagnosis not present

## 2015-01-28 DIAGNOSIS — M545 Low back pain: Secondary | ICD-10-CM | POA: Diagnosis not present

## 2015-01-28 DIAGNOSIS — M47816 Spondylosis without myelopathy or radiculopathy, lumbar region: Secondary | ICD-10-CM | POA: Diagnosis not present

## 2015-01-28 DIAGNOSIS — M4716 Other spondylosis with myelopathy, lumbar region: Secondary | ICD-10-CM | POA: Insufficient documentation

## 2015-02-13 ENCOUNTER — Encounter: Payer: Self-pay | Admitting: Cardiovascular Disease

## 2015-02-13 ENCOUNTER — Ambulatory Visit (INDEPENDENT_AMBULATORY_CARE_PROVIDER_SITE_OTHER): Payer: Commercial Managed Care - HMO | Admitting: Cardiovascular Disease

## 2015-02-13 VITALS — BP 114/64 | HR 84 | Resp 16 | Ht 61.0 in | Wt 140.0 lb

## 2015-02-13 DIAGNOSIS — I25810 Atherosclerosis of coronary artery bypass graft(s) without angina pectoris: Secondary | ICD-10-CM | POA: Diagnosis not present

## 2015-02-13 DIAGNOSIS — Z720 Tobacco use: Secondary | ICD-10-CM

## 2015-02-13 DIAGNOSIS — I5032 Chronic diastolic (congestive) heart failure: Secondary | ICD-10-CM | POA: Diagnosis not present

## 2015-02-13 DIAGNOSIS — I10 Essential (primary) hypertension: Secondary | ICD-10-CM

## 2015-02-13 DIAGNOSIS — I701 Atherosclerosis of renal artery: Secondary | ICD-10-CM

## 2015-02-13 MED ORDER — ENALAPRIL MALEATE 20 MG PO TABS: 20.0000 mg | ORAL_TABLET | Freq: Every day | ORAL | Status: DC

## 2015-02-13 NOTE — Progress Notes (Signed)
Patient ID: Meredith Gutierrez, female   DOB: 1940-11-08, 74 y.o.   MRN: [id number]     Cardiology Office Note   Date:  02/13/2015   ID:  Meredith Gutierrez, DOB [birth year], MRN [id number]  PCP:  Greig Right, MD  Cardiologist:   Sanda Klein, MD   Chief Complaint  Patient presents with  . Annual Exam    Patient has no complaints.      History of Present Illness: Meredith Gutierrez is a 74 y.o. female who presents for CAD, renal artery stenosis and hyperlipidemia follow up.   from a cardiac standpoint she is doing quite well. She denies angina pectoris or shortness of breath either at rest or with exertion. She has not had intermittent claudication or lower showed edema. She inquires about potentially simplifying her medication list. She is unfortunately intolerant to both statins and Zetia. She had labs performed with her primary care physician Dr. Burnett Sheng last month , but I don't have those results today.   Her major complaints are related to chronic low back pain which is often quite uncomfortable.   She continues to smoke 3 cigarettes a day , never more and does not think she will quit "until she dies".  She had bypass surgery in 2003 (LIMA to LAD and SVG to first diagonal artery). 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 She has not had serious cardiac event since that time. Cardiac catheterization and coronary angiography was performed in 2013. This showed that the LIMA bypass was atretic, but there was excellent flow down both the native LAD artery and the SVG to the diagonal artery. No new coronary lesions were noted. She has been chest pain-free since that time. She has hyperdynamic left ventricular systolic function. She has never had congestive heart failure.  She has been intolerant to multiple attempts at lipid lowering therapy including simvastatin, Crestor, Vytorin and Zetia monotherapy. She has not tolerated beta blockers secondary to bradycardia.  Past  Medical History  Diagnosis Date  . Hypertension   . Coronary artery disease   . Anxiety   . Diastolic dysfunction 5/46/5681  . Diastolic CHF, on admit treated. 08/10/2011  . Cerebral atherosclerosis   . Angina     never needed to take nitroglycerin.  . Sinus congestion     occ. tx. with Loratadine  . History of kidney stones 02-16-13    "hx. of overgrowth of muscle-causing some stricture"renal artery stenosis-being followed Meredith Gutierrez  . Cancer     skin cancers, 1 melanoma removed back -3 months ago  . COPD (chronic obstructive pulmonary disease)     02-16-13-"pt. denies this"-shows on CXR of 2- 2013.    Past Surgical History  Procedure Laterality Date  . Abdominal hysterectomy    . Appendectomy    . Cesarean section    . Cardiac catheterization  08/09/2011    no sign. coronary obstruction. LIMA is atretic w/excellent flow down the native LAD  . Cardiac catheterization  08/22/2001    no sign. coronary stenosis,  . Nm myocar perf wall motion  07/20/2010    normal  . Cataract extraction, bilateral Bilateral   . Coronary artery bypass graft  02-16-13    3'03-no bypasses -Had 2 aneurysm repairs(stents used)  . Laparotomy N/A 02/20/2013    Procedure: EXPLORATORY LAPAROTOMY;  Surgeon: [name], MD;  Location: [facility];  Service: Gynecology;  Laterality: N/A;  . Salpingoophorectomy Bilateral 02/20/2013    Procedure: BILATERAL SALPINGO OOPHORECTOMY;  Surgeon: Abigail Butts  Skeet Latch, MD;  Location: [facility];  Service: Gynecology;  Laterality: Bilateral;  . Left heart catheterization with coronary angiogram N/A 08/09/2011    Procedure: LEFT HEART CATHETERIZATION WITH CORONARY ANGIOGRAM;  Surgeon: Sanda Klein, MD;  Location: [facility];  Service: Cardiovascular;  Laterality: N/A;     Current Outpatient Prescriptions  Medication Sig Dispense Refill  . ALPRAZolam (NIRAVAM) 0.25 MG dissolvable tablet Take 0.25 mg by mouth 2 (two) times daily. Takes every day per patient    . aspirin EC 81 MG tablet Take 81  mg by mouth daily.    Marland Kitchen diltiazem (DILTIAZEM CD) 120 MG 24 hr capsule Take 1 capsule (120 mg total) by mouth daily. 30 capsule 11  . enalapril (VASOTEC) 20 MG tablet Take 1 tablet (20 mg total) by mouth daily. 60 tablet 11   No current facility-administered medications for this visit.    Allergies:   Meloxicam; Nitroglycerin; Codeine; Crestor; Lopressor; Penicillins; Prednisone; and Zetia    Social History:  The patient  reports that she has been smoking Cigarettes.  She has a 2 pack-year smoking history. She has never used smokeless tobacco. She reports that she does not drink alcohol or use illicit drugs.   Family History:  The patient's family history includes Breast cancer in her maternal aunt; Coronary artery disease in her father; Hypotension in her brother, father, mother, and sister; Stroke in her mother.    ROS:  Please see the history of present illness.    Otherwise, review of systems positive for none.   All other systems are reviewed and negative.    PHYSICAL EXAM: VS:  BP 114/64 mmHg  Pulse 84  Ht 5\' 1"  (1.549 m)  Wt 140 lb (63.504 kg)  BMI 26.47 kg/m2 , BMI Body mass index is 26.47 kg/(m^2).  General: Alert, oriented x3, no distress Head: no evidence of trauma, PERRL, EOMI, no exophtalmos or lid lag, no myxedema, no xanthelasma; normal ears, nose and oropharynx Neck: normal jugular venous pulsations and no hepatojugular reflux; brisk carotid pulses without delay and no carotid bruits Chest: clear to auscultation, no signs of consolidation by percussion or palpation, normal fremitus, symmetrical and full respiratory excursions Cardiovascular: normal position and quality of the apical impulse, regular rhythm, normal first and second heart sounds, 1/2 early peaking systolic ejection aortic murmur, rubs or gallops Abdomen: no tenderness or distention, no masses by palpation, no abnormal pulsatility or arterial bruits, normal bowel sounds, no hepatosplenomegaly Extremities:  no clubbing, cyanosis or edema; 2+ radial, ulnar and brachial pulses bilaterally; 2+ right femoral, posterior tibial and dorsalis pedis pulses; 2+ left femoral, posterior tibial and dorsalis pedis pulses; no subclavian or femoral bruits Neurological: grossly nonfocal Psych: euthymic mood, full affect   EKG:  EKG is ordered today. The ekg ordered today demonstrates  Normal sinus rhythm , normal tracing. QTC 423 ms   Recent Labs: No results found for requested labs within last 365 days.    Lipid Panel    Component Value Date/Time   CHOL 187 08/06/2011 0046   TRIG 156* 08/06/2011 0046   HDL 35* 08/06/2011 0046   CHOLHDL 5.3 08/06/2011 0046   VLDL 31 08/06/2011 0046   LDLCALC 121* 08/06/2011 0046      Wt Readings from Last 3 Encounters:  02/13/15 140 lb (63.504 kg)  02/13/14 134 lb 8 oz (61.009 kg)  03/20/13 137 lb 8 oz (62.37 kg)   .   ASSESSMENT AND PLAN:  1. CAD s/p CABG 08/24/2001 (LIMA to LAD,  SVG to 1st diagonal, ligation of LAD artery aneurysm) Asymptomatic. Low risk anatomy by coronary angiography in 2013. Normal perfusion 2012.  2. HTN (hypertension) Good control.  We'll try to simplify her medications. Discontinue hydralazine and double the dose of enalapril. She has a home blood pressure monitor. Recommended she call us with the results in about a week's time.  3. Dyslipidemia, statin intolerant She has mildly elevated LDL cholesterol. While it will be highly desirable to treat this to prevent coronary disease progression, she has been intolerant to multiple lipid-lowering agents in the past. Will try to see if she can get Praluent or Repatha,  But she does not think she can afford the co-pays on this type of medication. I believe she has heterozygous familial hypercholesterolemia.  4. Tobacco abuse  She is a very light cigarette smoker. I have encouraged her to quit smoking altogether, but she politely refuses, as she has done before.  5.  History of left renal  artery stenosis  Periodic reevaluation by ultrasonography.  Interventional necessary as long as she has good blood pressure control and stable renal function..    Current medicines are reviewed at length with the patient today.  The patient does not have concerns regarding medicines.  Labs/ tests ordered today include:   Orders Placed This Encounter  Procedures  . EKG 12-Lead     Patient Instructions  Medication Instructions:   STOP HYDRALAZINE  INCREASE ENALAPRIL TO 20MG  TWICE A DAY   Testing/Procedures:  Your physician has requested that you have a renal artery duplex December OF THIS YEAR (YEARLY STUDY). During this test, an ultrasound is used to evaluate blood flow to the kidneys. Allow one hour for this exam. Do not eat after midnight the day before and avoid carbonated beverages. Take your medications as you usually do.    Follow-Up:  Dr. Sallyanne Kuster recommends that you schedule a follow-up appointment in: ONE YEAR     Any Other Special Instructions Will Be Listed Below (If Applicable).  Your physician has requested that you regularly monitor and record your blood pressure readings at home. Please use the same machine at the same time of day to check your readings and record them to bring to your follow-up visit.  Blood Pressure Record Sheet Your blood pressure on this visit to the emergency department or clinic is elevated. This does not necessarily mean you have high blood pressure (hypertension), but it does mean that your blood pressure needs to be rechecked. Many times your blood pressure can increase due to illness, pain, anxiety, or other factors. We recommend that you get a series of blood pressure readings done over a period of 5 days. It is best to get a reading in the morning and one in the evening. You should make sure to sit and relax for 1-5 minutes before the reading is taken. Write the readings down and make a follow-up appointment with your health care  provider to discuss the results. If there is not a free clinic or a drug store with a blood-pressure-taking machine near you, you can purchase blood-pressure-taking equipment from a drug store. Having one in the home allows you the convenience of taking your blood pressure while you are home and relaxed.  Your blood pressure in the emergency department or clinic on ________ was ____________________. BLOOD PRESSURE LOG Date: _______________________  a.m. _____________________  p.m. _____________________ Date: _______________________  a.m. _____________________  p.m. _____________________ Date: _______________________  a.m. _____________________  p.m. _____________________ Date: _______________________  a.m.  _____________________  p.m. _____________________ Date: _______________________  a.m. _____________________  p.m. _____________________ Document Released: 02/27/2003 Document Revised: 10/15/2013 Document Reviewed: 07/24/2013 ExitCare Patient Information 2015 [place], [place]. This information is not intended to replace advice given to you by your health care provider. Make sure you discuss any questions you have with your health care provider.        Mikael Spray, MD  02/13/2015 5:49 PM    Sanda Klein, MD, [facility] [phone] office [phone] pager

## 2015-02-13 NOTE — Patient Instructions (Signed)
Medication Instructions:   STOP HYDRALAZINE  INCREASE ENALAPRIL TO 20MG  TWICE A DAY   Testing/Procedures:  Your physician has requested that you have a renal artery duplex December OF THIS YEAR (YEARLY STUDY). During this test, an ultrasound is used to evaluate blood flow to the kidneys. Allow one hour for this exam. Do not eat after midnight the day before and avoid carbonated beverages. Take your medications as you usually do.    Follow-Up:  Dr. Sallyanne Kuster recommends that you schedule a follow-up appointment in: ONE YEAR     Any Other Special Instructions Will Be Listed Below (If Applicable).  Your physician has requested that you regularly monitor and record your blood pressure readings at home. Please use the same machine at the same time of day to check your readings and record them to bring to your follow-up visit.  Blood Pressure Record Sheet Your blood pressure on this visit to the emergency department or clinic is elevated. This does not necessarily mean you have high blood pressure (hypertension), but it does mean that your blood pressure needs to be rechecked. Many times your blood pressure can increase due to illness, pain, anxiety, or other factors. We recommend that you get a series of blood pressure readings done over a period of 5 days. It is best to get a reading in the morning and one in the evening. You should make sure to sit and relax for 1-5 minutes before the reading is taken. Write the readings down and make a follow-up appointment with your health care provider to discuss the results. If there is not a free clinic or a drug store with a blood-pressure-taking machine near you, you can purchase blood-pressure-taking equipment from a drug store. Having one in the home allows you the convenience of taking your blood pressure while you are home and relaxed.  Your blood pressure in the emergency department or clinic on ________ was ____________________. BLOOD PRESSURE  LOG Date: _______________________  a.m. _____________________  p.m. _____________________ Date: _______________________  a.m. _____________________  p.m. _____________________ Date: _______________________  a.m. _____________________  p.m. _____________________ Date: _______________________  a.m. _____________________  p.m. _____________________ Date: _______________________  a.m. _____________________  p.m. _____________________ Document Released: 02/27/2003 Document Revised: 10/15/2013 Document Reviewed: 07/24/2013 ExitCare Patient Information 2015 [place], [place]. This information is not intended to replace advice given to you by your health care provider. Make sure you discuss any questions you have with your health care provider.

## 2015-02-14 ENCOUNTER — Telehealth: Payer: Self-pay | Admitting: Cardiovascular Disease

## 2015-02-14 MED ORDER — ENALAPRIL MALEATE 20 MG PO TABS: 20.0000 mg | ORAL_TABLET | Freq: Two times a day (BID) | ORAL | Status: DC

## 2015-02-14 NOTE — Telephone Encounter (Signed)
Dose increase recommendation for enalapril not reflected on bottle instructions. Reviewed w/ patient and instructed to take twice daily. Pt voiced understanding. Resubmitted Rx for Dr. Victorino December recommended dose.

## 2015-02-14 NOTE — Telephone Encounter (Signed)
Please call,question about how she is suppose to be taking her Enalapril.

## 2015-02-18 ENCOUNTER — Telehealth: Payer: Self-pay | Admitting: Cardiovascular Disease

## 2015-02-18 NOTE — Telephone Encounter (Signed)
Pt called in stating that she was instructed to increase her Enalapril and on Sunday she said that she began to break out in a rash on both arms. She would like to be advised on what to do. Please f/u with her   Thanks

## 2015-02-18 NOTE — Telephone Encounter (Signed)
Typically, allergic reactions like rashes are not dose dependent, so may not necessarily be enalapril related. For now though, I agree with the current plan. Please give it a week or so to see if it resolves and call us back.

## 2015-02-18 NOTE — Telephone Encounter (Signed)
Spoke to patient. Rash on trunk, arms, hands. States "looks like the measles but smaller". She reports itching, mainly on hands. She is using topical itch cream for relief. Per last OV note, this nurse recommended increase of Enalapril based on Dr. Victorino December instructions. Patient expressed understanding of how to take medication - assumption is this is being taken as Dr. Sallyanne Kuster advised. Pt on increased dose since Friday. SE's started Sunday. Advised to drop back to previous dosing of enalapril for now, continue to check BPs, take topical cream as needed for relief -  Will route to Dr. Sallyanne Kuster & Erasmo Downer for further advice on med, SE's.  Pt agreeable to plan.

## 2015-02-18 NOTE — Telephone Encounter (Signed)
Dr. Victorino December instructions relayed to patient. Advised to call ~1 week w/ update, call sooner if worsening problems.

## 2015-02-27 ENCOUNTER — Telehealth: Payer: Self-pay | Admitting: *Deleted

## 2015-02-27 DIAGNOSIS — M47816 Spondylosis without myelopathy or radiculopathy, lumbar region: Secondary | ICD-10-CM | POA: Diagnosis not present

## 2015-02-27 DIAGNOSIS — Z6826 Body mass index (BMI) 26.0-26.9, adult: Secondary | ICD-10-CM | POA: Diagnosis not present

## 2015-02-27 NOTE — Telephone Encounter (Signed)
LMTCB

## 2015-02-27 NOTE — Telephone Encounter (Signed)
As discussed before, I am doubtful that the rash was related to the enalapril in the first place. Would refer to PCP.

## 2015-02-27 NOTE — Telephone Encounter (Signed)
Incoming call from patient.  See note from 9/6 regarding Enalapril dose change.  She reports being on smaller dose of Enalapril x 1 week.   Notes BPs stable:  Gave me past 2 morning reads -  126/69 HR 71 132/74 HR 69   Pt still having rash. Needs possible instruction on other med changes - notes cannot come in for appt d/t transportation limitations.  Will route to [facility], Dr. Loletha Grayer for advice.

## 2015-03-03 MED ORDER — VALSARTAN 160 MG PO TABS: 160.0000 mg | ORAL_TABLET | Freq: Every day | ORAL | Status: DC

## 2015-03-03 NOTE — Telephone Encounter (Signed)
Stop enalapril, start valsartan 160 mg once daily, check home BP daily if able and make appt to see me in 2 weeks to check BP in office.

## 2015-03-03 NOTE — Telephone Encounter (Signed)
Pt is returning Nathan's call about the rash she has. She feels that it is from Enalapril but Dr. Loletha Grayer doesn't believe that is what is causing the rash. Please f/u with her  Thanks

## 2015-03-03 NOTE — Telephone Encounter (Signed)
Called patient and told her to monitor b/p at home and come in for a BP check in 2 weeks by Erasmo Downer  Enalapril dcd and Valsartan 160 mg sent to pharmacy on [address]

## 2015-03-03 NOTE — Telephone Encounter (Signed)
Routing to [employer], pharmacist.  Patient thinks the rash is caused by medication.

## 2015-03-04 NOTE — Telephone Encounter (Signed)
Called patient and LVM that she has an appointment with Wallace Keller for BP check at 320pm on 9/26

## 2015-03-10 ENCOUNTER — Ambulatory Visit (INDEPENDENT_AMBULATORY_CARE_PROVIDER_SITE_OTHER): Payer: Commercial Managed Care - HMO | Admitting: Pharmacist Clinician (PhC)/ Clinical Pharmacy Specialist

## 2015-03-10 VITALS — BP 142/82 | HR 68 | Ht 61.0 in | Wt 138.4 lb

## 2015-03-10 DIAGNOSIS — I10 Essential (primary) hypertension: Secondary | ICD-10-CM

## 2015-03-10 NOTE — Progress Notes (Signed)
    03/11/2015 Meredith Gutierrez 10-06-1940 [id number]   HPI:  Meredith Gutierrez is a 74 y.o. female patient of Dr. Sallyanne Kuster , with a PMH below who presents today for hypertension clinic evaluation.  She has a long history of hypertension, which has been mostly well controlled.   She saw Dr. Sallyanne Kuster on Sept 1, and in an effort to simplify her drug regimen, he discontinued her hydralazine and increased the enalapril from 10 mg to 20 mg daily.  About 3 days after that, she developed a rash on her trunk and arms, which she associated with the increase dose in enalapril.  She was finally switched to valsartan 160 mg and is in the office today for follow up.  She notes the rash is still present, although has faded some.    Cardiac Hx: CAD, RAS, hyperlipidemia, HTN  Family Hx: both parents and 4 siblings all with hypertension  Social Hx: smokes 2-3 cigarettes per day (after meals), does not drink alcohol, drinks only decaffeinated coffees/teas  Diet: adds small amount of salt when cooking, eats most meals at home  Exercise: none, although does live in basement of daughter's house, climbs steps regularly  Home BP readings: per patient are all WNL, checks BP several times each day; does not have her cuff or list of readings with her  Current antihypertensive medications: valsartan 160 mg qd, diltiazem 120 mg qd   Current Outpatient Prescriptions  Medication Sig Dispense Refill  . ALPRAZolam (NIRAVAM) 0.25 MG dissolvable tablet Take 0.25 mg by mouth 2 (two) times daily. Takes every day per patient    . aspirin EC 81 MG tablet Take 81 mg by mouth daily.    Marland Kitchen diltiazem (DILTIAZEM CD) 120 MG 24 hr capsule Take 1 capsule (120 mg total) by mouth daily. 30 capsule 11  . traMADol (ULTRAM) 50 MG tablet Take 1 tablet by mouth every 8 (eight) hours as needed.    . valsartan (DIOVAN) 160 MG tablet Take 1 tablet (160 mg total) by mouth daily. 90 tablet 3   No current facility-administered medications for this  visit.    Allergies  Allergen Reactions  . Meloxicam   . Nitroglycerin   . Codeine     Shortness of breath  . Crestor [Rosuvastatin Calcium]     Weakness with statins  . Lopressor [Metoprolol Tartrate]     Bradycardia   . Penicillins     Shortness of breath  . Prednisone     Shortness of breath  . Zetia [Ezetimibe]     Weakness     Past Medical History  Diagnosis Date  . Hypertension   . Coronary artery disease   . Anxiety   . Diastolic dysfunction 12/22/6267  . Diastolic CHF, on admit treated. 08/10/2011  . Cerebral atherosclerosis   . Angina     never needed to take nitroglycerin.  . Sinus congestion     occ. tx. with Loratadine  . History of kidney stones 02-16-13    "hx. of overgrowth of muscle-causing some stricture"renal artery stenosis-being followed [facility]  . Cancer     skin cancers, 1 melanoma removed back -3 months ago  . COPD (chronic obstructive pulmonary disease)     02-16-13-"pt. denies this"-shows on CXR of 2- 2013.    Blood pressure 142/82, pulse 68, height 5\' 1"  (1.549 m), weight 138 lb 6.4 oz (62.778 kg).    Tommy Medal PharmD CPP [facility]

## 2015-03-10 NOTE — Patient Instructions (Addendum)
Your blood pressure today is 142/82 If you see readings consistently >979 systolic please call   Check your blood pressure at home daily and keep record of the readings.  Take your BP meds as follows: continue all your current medications  Bring all of your meds, your BP cuff and your record of home blood pressures to your next appointment.  Exercise as you're able, try to walk approximately 30 minutes per day.  Keep salt intake to a minimum, especially watch canned and prepared boxed foods.  Eat more fresh fruits and vegetables and fewer canned items.  Avoid eating in fast food restaurants.    HOW TO TAKE YOUR BLOOD PRESSURE: . Rest 5 minutes before taking your blood pressure. .  Don't smoke or drink caffeinated beverages for at least 30 minutes before. . Take your blood pressure before (not after) you eat. . Sit comfortably with your back supported and both feet on the floor (don't cross your legs). . Elevate your arm to heart level on a table or a desk. . Use the proper sized cuff. It should fit smoothly and snugly around your bare upper arm. There should be enough room to slip a fingertip under the cuff. The bottom edge of the cuff should be 1 inch above the crease of the elbow. . Ideally, take 3 measurements at one sitting and record the average.

## 2015-03-11 NOTE — Assessment & Plan Note (Signed)
Today her BP in the office remains well controlled at 142/82.  There was no significant change from seated to standing pressure.  I don't believe that her rash was due to the increase in dose of enalapril, and because it is still present, I suggested she need to see her PCP for evaluation.  Will continue with the valsartan 160 and diltiazem 120 mg.  Asked that she continue to check her BP at home, but no more than twice daily.  Reviewed goals for keeping pressure <150/90, and that she would need to call if she sees a consistent rise in pressures above that goal.

## 2015-03-31 ENCOUNTER — Other Ambulatory Visit: Payer: Self-pay | Admitting: Cardiovascular Disease

## 2015-03-31 MED ORDER — DILTIAZEM HCL ER COATED BEADS 120 MG PO CP24: 120.0000 mg | ORAL_CAPSULE | Freq: Every day | ORAL | Status: DC

## 2015-03-31 NOTE — Telephone Encounter (Signed)
Rx(s) sent to pharmacy electronically.  

## 2015-03-31 NOTE — Telephone Encounter (Signed)
°  STAT if patient is at the pharmacy , call can be transferred to refill team.   1. Which medications need to be refilled? Diltazem 120mg   2. Which pharmacy/location is medication to be sent to?[facility]   3. Do they need a 30 day or 90 day supply? [place]

## 2015-04-03 DIAGNOSIS — F17200 Nicotine dependence, unspecified, uncomplicated: Secondary | ICD-10-CM | POA: Diagnosis not present

## 2015-04-03 DIAGNOSIS — Z23 Encounter for immunization: Secondary | ICD-10-CM | POA: Diagnosis not present

## 2015-04-03 DIAGNOSIS — I701 Atherosclerosis of renal artery: Secondary | ICD-10-CM | POA: Diagnosis not present

## 2015-04-03 DIAGNOSIS — R21 Rash and other nonspecific skin eruption: Secondary | ICD-10-CM | POA: Diagnosis not present

## 2015-04-03 DIAGNOSIS — Z1239 Encounter for other screening for malignant neoplasm of breast: Secondary | ICD-10-CM | POA: Diagnosis not present

## 2015-04-03 DIAGNOSIS — E7800 Pure hypercholesterolemia, unspecified: Secondary | ICD-10-CM | POA: Diagnosis not present

## 2015-04-03 DIAGNOSIS — I10 Essential (primary) hypertension: Secondary | ICD-10-CM | POA: Diagnosis not present

## 2015-04-11 DIAGNOSIS — Z1231 Encounter for screening mammogram for malignant neoplasm of breast: Secondary | ICD-10-CM | POA: Diagnosis not present

## 2015-04-11 LAB — HM MAMMOGRAPHY

## 2015-05-12 DIAGNOSIS — M5136 Other intervertebral disc degeneration, lumbar region: Secondary | ICD-10-CM | POA: Diagnosis not present

## 2015-05-26 ENCOUNTER — Ambulatory Visit (HOSPITAL_COMMUNITY)
Admission: RE | Admit: 2015-05-26 | Discharge: 2015-05-26 | Disposition: A | Discharge status: Home / Self Care | Payer: Commercial Managed Care - HMO | Source: Ambulatory Visit | Attending: Cardiology | Admitting: Cardiology

## 2015-05-26 DIAGNOSIS — I10 Essential (primary) hypertension: Secondary | ICD-10-CM | POA: Diagnosis not present

## 2015-05-26 DIAGNOSIS — I701 Atherosclerosis of renal artery: Secondary | ICD-10-CM | POA: Insufficient documentation

## 2016-02-03 ENCOUNTER — Other Ambulatory Visit: Payer: Self-pay | Admitting: Pharmacist Clinician (PhC)/ Clinical Pharmacy Specialist

## 2016-02-03 NOTE — Telephone Encounter (Signed)
Rx(s) sent to pharmacy electronically.  

## 2016-02-05 DIAGNOSIS — Z79899 Other long term (current) drug therapy: Secondary | ICD-10-CM | POA: Diagnosis not present

## 2016-02-05 DIAGNOSIS — E7800 Pure hypercholesterolemia, unspecified: Secondary | ICD-10-CM | POA: Diagnosis not present

## 2016-02-05 DIAGNOSIS — F17200 Nicotine dependence, unspecified, uncomplicated: Secondary | ICD-10-CM | POA: Diagnosis not present

## 2016-02-05 DIAGNOSIS — I10 Essential (primary) hypertension: Secondary | ICD-10-CM | POA: Diagnosis not present

## 2016-02-05 DIAGNOSIS — Z0000 Encounter for general adult medical examination without abnormal findings: Secondary | ICD-10-CM | POA: Diagnosis not present

## 2016-02-05 DIAGNOSIS — E048 Other specified nontoxic goiter: Secondary | ICD-10-CM | POA: Diagnosis not present

## 2016-02-05 DIAGNOSIS — I2510 Atherosclerotic heart disease of native coronary artery without angina pectoris: Secondary | ICD-10-CM | POA: Diagnosis not present

## 2016-02-05 DIAGNOSIS — M8580 Other specified disorders of bone density and structure, unspecified site: Secondary | ICD-10-CM | POA: Diagnosis not present

## 2016-02-05 DIAGNOSIS — M5136 Other intervertebral disc degeneration, lumbar region: Secondary | ICD-10-CM | POA: Diagnosis not present

## 2016-02-06 LAB — LIPID PANEL
CHOLESTEROL: 229 — AB (ref 0–200)
HDL: 46 (ref 35–70)
LDL CALC: 123
Triglycerides: 302 — AB (ref 40–160)

## 2016-02-06 LAB — TSH: TSH: 0.01 — AB (ref <=5.90)

## 2016-02-06 LAB — VITAMIN D 25 HYDROXY (VIT D DEFICIENCY, FRACTURES): Vit D, 25-Hydroxy: 20.8

## 2016-02-13 ENCOUNTER — Encounter: Payer: Self-pay | Admitting: Cardiovascular Disease

## 2016-02-13 ENCOUNTER — Ambulatory Visit (INDEPENDENT_AMBULATORY_CARE_PROVIDER_SITE_OTHER): Payer: Commercial Managed Care - HMO | Admitting: Cardiovascular Disease

## 2016-02-13 VITALS — BP 119/68 | HR 55 | Ht 61.0 in | Wt 144.2 lb

## 2016-02-13 DIAGNOSIS — I5032 Chronic diastolic (congestive) heart failure: Secondary | ICD-10-CM | POA: Diagnosis not present

## 2016-02-13 DIAGNOSIS — R001 Bradycardia, unspecified: Secondary | ICD-10-CM | POA: Diagnosis not present

## 2016-02-13 DIAGNOSIS — I10 Essential (primary) hypertension: Secondary | ICD-10-CM | POA: Diagnosis not present

## 2016-02-13 DIAGNOSIS — I25810 Atherosclerosis of coronary artery bypass graft(s) without angina pectoris: Secondary | ICD-10-CM | POA: Diagnosis not present

## 2016-02-13 DIAGNOSIS — E785 Hyperlipidemia, unspecified: Secondary | ICD-10-CM

## 2016-02-13 DIAGNOSIS — Z87448 Personal history of other diseases of urinary system: Secondary | ICD-10-CM

## 2016-02-13 DIAGNOSIS — Z8679 Personal history of other diseases of the circulatory system: Secondary | ICD-10-CM

## 2016-02-13 DIAGNOSIS — Z720 Tobacco use: Secondary | ICD-10-CM

## 2016-02-13 NOTE — Progress Notes (Signed)
Cardiology Office Note    Date:  02/14/2016   ID:  Meredith Gutierrez, DOB 06/25/1940, MRN [id number]  PCP:  Greig Right, MD  Cardiologist:   Sanda Klein, MD   Chief Complaint  Patient presents with  . Follow-up    History of Present Illness:  Meredith Gutierrez is a 75 y.o. female with CAD S/P CABG (2003 LIMA to LAD and SVG to first diagonal, ligation of large LAD artery aneurysm, atraumatic LIMA with patent LAD at cardiac cath in 2013), without coronary events since that time. She has hyperdynamic left ventricular systolic function, moderate LVH, diastolic dysfunction by echo, but no clinical heart failure since 2013. She has hyperlipidemia but has been intolerance to numerous attempts at lipid lowering therapy (rosuvastatin, simvastatin, Zetia and others) and is unable to take beta blockers due to bradycardia. She reports being diagnosed with hyperthyroidism and has an upcoming appointment with Dr. Chalmers Cater.  She has not had any new health problems since her last appointment. Labs will be performed in Dr. Florina Ou office. She continues to smoke her usual 3 cigarettes a day. She inquires today about the usefulness of red yeast rice.   Past Medical History:  Diagnosis Date  . Angina    never needed to take nitroglycerin.  . Anxiety   . Cancer ([place])    skin cancers, 1 melanoma removed back -3 months ago  . Cerebral atherosclerosis   . COPD (chronic obstructive pulmonary disease) ([place])    02-16-13-"pt. denies this"-shows on CXR of 2- 2013.  Marland Kitchen Coronary artery disease   . Diastolic CHF, on admit treated. 08/10/2011  . Diastolic dysfunction 0000000  . History of kidney stones 02-16-13   "hx. of overgrowth of muscle-causing some stricture"renal artery stenosis-being followed [facility]  . Hypertension   . Sinus congestion    occ. tx. with Loratadine    Past Surgical History:  Procedure Laterality Date  . ABDOMINAL HYSTERECTOMY    . APPENDECTOMY    . CARDIAC CATHETERIZATION  08/09/2011   no sign. coronary obstruction. LIMA is atretic w/excellent flow down the native LAD  . CARDIAC CATHETERIZATION  08/22/2001   no sign. coronary stenosis,  . CATARACT EXTRACTION, BILATERAL Bilateral   . CESAREAN SECTION    . CORONARY ARTERY BYPASS GRAFT  02-16-13   3'03-no bypasses -Had 2 aneurysm repairs(stents used)  . LAPAROTOMY N/A 02/20/2013   Procedure: EXPLORATORY LAPAROTOMY;  Surgeon: [name], MD;  Location: [facility];  Service: Gynecology;  Laterality: N/A;  . LEFT HEART CATHETERIZATION WITH CORONARY ANGIOGRAM N/A 08/09/2011   Procedure: LEFT HEART CATHETERIZATION WITH CORONARY ANGIOGRAM;  Surgeon: Sanda Klein, MD;  Location: [facility]ATH [facility];  Service: Cardiovascular;  Laterality: N/A;  . NM MYOCAR PERF WALL MOTION  07/20/2010   normal  . SALPINGOOPHORECTOMY Bilateral 02/20/2013   Procedure: BILATERAL SALPINGO OOPHORECTOMY;  Surgeon: [name], MD;  Location: [facility];  Service: Gynecology;  Laterality: Bilateral;    Current Medications: Outpatient Medications Prior to Visit  Medication Sig Dispense Refill  . ALPRAZolam (NIRAVAM) 0.25 MG dissolvable tablet Take 0.25 mg by mouth 2 (two) times daily. Takes every day per patient    . aspirin EC 81 MG tablet Take 81 mg by mouth daily.    Marland Kitchen diltiazem (DILTIAZEM CD) 120 MG 24 hr capsule Take 1 capsule (120 mg total) by mouth daily. 30 capsule 10  . traMADol (ULTRAM) 50 MG tablet Take 1 tablet by mouth every 8 (eight) hours as needed.    . valsartan (DIOVAN) 160 MG  tablet TAKE 1 TABLET BY MOUTH ONCE DAILY. 90 tablet 0   No facility-administered medications prior to visit.      Allergies:   Codeine; Penicillin g; Prednisolone acetate; Meloxicam; Nitroglycerin; Crestor [rosuvastatin calcium]; Lopressor [metoprolol tartrate]; Penicillins; Prednisone; Sulfamethoxazole; and Zetia [ezetimibe]   Social History   Social History  . Marital status: Widowed    Spouse name: N/A  . Number of children: N/A  . Years of education: N/A   Social  History Main Topics  . Smoking status: Current Every Day Smoker    Packs/day: 0.10    Years: 20.00    Types: Cigarettes  . Smokeless tobacco: Never Used     Comment: smoking cessation info given  . Alcohol use No  . Drug use: No  . Sexual activity: No   Other Topics Concern  . None   Social History Narrative  . None     Family History:  The patient's family history includes Breast cancer in her maternal aunt; Coronary artery disease in her father; Hypotension in her brother, father, mother, and sister; Stroke in her mother.   ROS:   Please see the history of present illness.    ROS All other systems reviewed and are negative.   PHYSICAL EXAM:   VS:  BP 119/68 (BP Location: Right Arm, Patient Position: Sitting, Cuff Size: Normal)   Ht 5\' 1"  (1.549 m)   Wt 144 lb 3.2 oz (65.4 kg)   SpO2 97%   BMI 27.25 kg/m    GEN: Well nourished, well developed, in no acute distress  HEENT: normal  Neck: no JVD, carotid bruits, or masses Cardiac: RRR; no murmurs, rubs, or gallops,no edema  Respiratory:  clear to auscultation bilaterally, normal work of breathing GI: soft, nontender, nondistended, + BS MS: no deformity or atrophy  Skin: warm and dry, no rash Neuro:  Alert and Oriented x 3, Strength and sensation are intact Psych: euthymic mood, full affect  Wt Readings from Last 3 Encounters:  02/13/16 144 lb 3.2 oz (65.4 kg)  03/11/15 138 lb 6.4 oz (62.8 kg)  02/13/15 140 lb (63.5 kg)      Studies/Labs Reviewed:   EKG:  EKG is ordered today.  The ekg ordered today demonstrates NSR, normal tracing  Recent Labs: No results found for requested labs within last 8760 hours.   Lipid Panel    Component Value Date/Time   CHOL 187 08/06/2011 0046   TRIG 156 (H) 08/06/2011 0046   HDL 35 (L) 08/06/2011 0046   CHOLHDL 5.3 08/06/2011 0046   VLDL 31 08/06/2011 0046   LDLCALC 121 (H) 08/06/2011 0046    ASSESSMENT:    1. Coronary artery disease involving autologous artery coronary  bypass graft without angina pectoris   2. Chronic diastolic congestive heart failure ([place])   3. Essential hypertension   4. Dyslipidemia, statin intol.   5. Tobacco abuse   6. History of left renal artery stenosis   7. Sinus bradycardia, beta blocker intol.      PLAN:  In order of problems listed above:  1. CAD: Recent cardiac catheterization with low risk anatomy. 2. CHF: She appears clinically euvolemic, NYHA functional class I. As far as I know, she has had a single episode of clinical heart failure related to poorly controlled hypertension in February 2013. At that time her cardiac catheterization showed LVEDP 28 mmHg. Diuretic therapy is currently not necessary. 3. HTN: Well controlled. 4. HLP: Statin intolerance with multiple attempts. I told her that red-yeast-rice  contains a low dose of a statin. This would we better than nothing. We did review PCS K9 inhibitors. I'm sure she has heterozygous familial hypercholesterolemia. Will re-evaluate her more recent lipid profile. Try to see if we can get financial support for Repatha. 5. Tobacco abuse: She is a very light cigarette smoker. I have encouraged her to quit smoking altogether, but she politely refuses, as she has done before. 6. History of left renal artery stenosis: Reported on previous studies, not confirmed on last duplex ultrasound performed December 2016. Whether disposition are not, no intervention is planned unless she has difficulty control hypertension or deteriorating renal function. 7. Did not tolerate beta blockers in the past  Medication Adjustments/Labs and Tests Ordered: Current medicines are reviewed at length with the patient today.  Concerns regarding medicines are outlined above.  Medication changes, Labs and Tests ordered today are listed in the Patient Instructions below. Patient Instructions  Dr Sallyanne Kuster recommends that you schedule a follow-up appointment in 12 months. You will receive a reminder letter in  the mail two months in advance. If you don't receive a letter, please call our office to schedule the follow-up appointment.  If you need a refill on your cardiac medications before your next appointment, please call your pharmacy.    Signed, Sanda Klein, MD  02/14/2016 10:12 AM    [facility] [address], [place], [place]  [address] Phone: [phone]; Fax: [phone]

## 2016-02-13 NOTE — Patient Instructions (Signed)
Dr Croitoru recommends that you schedule a follow-up appointment in 12 months. You will receive a reminder letter in the mail two months in advance. If you don't receive a letter, please call our office to schedule the follow-up appointment.  If you need a refill on your cardiac medications before your next appointment, please call your pharmacy. 

## 2016-02-14 ENCOUNTER — Encounter: Payer: Self-pay | Admitting: Cardiovascular Disease

## 2016-02-14 DIAGNOSIS — Z8679 Personal history of other diseases of the circulatory system: Secondary | ICD-10-CM | POA: Insufficient documentation

## 2016-02-17 ENCOUNTER — Other Ambulatory Visit: Payer: Self-pay | Admitting: Cardiovascular Disease

## 2016-02-17 NOTE — Telephone Encounter (Signed)
Rx(s) sent to pharmacy electronically.  

## 2016-02-26 DIAGNOSIS — Z23 Encounter for immunization: Secondary | ICD-10-CM | POA: Diagnosis not present

## 2016-02-26 DIAGNOSIS — M545 Low back pain: Secondary | ICD-10-CM | POA: Diagnosis not present

## 2016-02-26 DIAGNOSIS — R21 Rash and other nonspecific skin eruption: Secondary | ICD-10-CM | POA: Diagnosis not present

## 2016-03-17 DIAGNOSIS — M47816 Spondylosis without myelopathy or radiculopathy, lumbar region: Secondary | ICD-10-CM | POA: Diagnosis not present

## 2016-03-25 DIAGNOSIS — M47816 Spondylosis without myelopathy or radiculopathy, lumbar region: Secondary | ICD-10-CM | POA: Diagnosis not present

## 2016-04-07 DIAGNOSIS — M47816 Spondylosis without myelopathy or radiculopathy, lumbar region: Secondary | ICD-10-CM | POA: Diagnosis not present

## 2016-04-07 DIAGNOSIS — Z6825 Body mass index (BMI) 25.0-25.9, adult: Secondary | ICD-10-CM | POA: Diagnosis not present

## 2016-04-22 DIAGNOSIS — M47816 Spondylosis without myelopathy or radiculopathy, lumbar region: Secondary | ICD-10-CM | POA: Diagnosis not present

## 2016-05-05 ENCOUNTER — Other Ambulatory Visit: Payer: Self-pay

## 2016-05-05 MED ORDER — VALSARTAN 160 MG PO TABS: 160.0000 mg | ORAL_TABLET | Freq: Every day | ORAL | 3 refills | Status: DC

## 2016-05-10 DIAGNOSIS — I10 Essential (primary) hypertension: Secondary | ICD-10-CM | POA: Diagnosis not present

## 2016-05-10 DIAGNOSIS — E0590 Thyrotoxicosis, unspecified without thyrotoxic crisis or storm: Secondary | ICD-10-CM | POA: Diagnosis not present

## 2016-05-10 DIAGNOSIS — Z79899 Other long term (current) drug therapy: Secondary | ICD-10-CM | POA: Diagnosis not present

## 2016-05-10 DIAGNOSIS — M5136 Other intervertebral disc degeneration, lumbar region: Secondary | ICD-10-CM | POA: Diagnosis not present

## 2016-05-10 DIAGNOSIS — R4181 Age-related cognitive decline: Secondary | ICD-10-CM | POA: Diagnosis not present

## 2016-05-12 DIAGNOSIS — M47816 Spondylosis without myelopathy or radiculopathy, lumbar region: Secondary | ICD-10-CM | POA: Diagnosis not present

## 2016-05-12 DIAGNOSIS — Z6825 Body mass index (BMI) 25.0-25.9, adult: Secondary | ICD-10-CM | POA: Diagnosis not present

## 2016-06-21 DIAGNOSIS — M47818 Spondylosis without myelopathy or radiculopathy, sacral and sacrococcygeal region: Secondary | ICD-10-CM | POA: Diagnosis not present

## 2016-06-21 DIAGNOSIS — M47816 Spondylosis without myelopathy or radiculopathy, lumbar region: Secondary | ICD-10-CM | POA: Diagnosis not present

## 2016-06-21 DIAGNOSIS — M47819 Spondylosis without myelopathy or radiculopathy, site unspecified: Secondary | ICD-10-CM | POA: Insufficient documentation

## 2016-07-07 DIAGNOSIS — Z6825 Body mass index (BMI) 25.0-25.9, adult: Secondary | ICD-10-CM | POA: Diagnosis not present

## 2016-07-07 DIAGNOSIS — M47816 Spondylosis without myelopathy or radiculopathy, lumbar region: Secondary | ICD-10-CM | POA: Diagnosis not present

## 2016-07-08 DIAGNOSIS — K5900 Constipation, unspecified: Secondary | ICD-10-CM | POA: Diagnosis not present

## 2016-07-23 DIAGNOSIS — R791 Abnormal coagulation profile: Secondary | ICD-10-CM | POA: Diagnosis not present

## 2016-07-23 DIAGNOSIS — R609 Edema, unspecified: Secondary | ICD-10-CM | POA: Diagnosis not present

## 2016-07-23 DIAGNOSIS — M7989 Other specified soft tissue disorders: Secondary | ICD-10-CM | POA: Diagnosis not present

## 2016-07-23 DIAGNOSIS — R600 Localized edema: Secondary | ICD-10-CM | POA: Diagnosis not present

## 2016-07-26 DIAGNOSIS — R350 Frequency of micturition: Secondary | ICD-10-CM | POA: Diagnosis not present

## 2016-07-26 DIAGNOSIS — L03119 Cellulitis of unspecified part of limb: Secondary | ICD-10-CM | POA: Diagnosis not present

## 2016-07-26 DIAGNOSIS — Z79899 Other long term (current) drug therapy: Secondary | ICD-10-CM | POA: Diagnosis not present

## 2016-07-27 DIAGNOSIS — L03119 Cellulitis of unspecified part of limb: Secondary | ICD-10-CM | POA: Diagnosis not present

## 2016-07-27 DIAGNOSIS — Z79899 Other long term (current) drug therapy: Secondary | ICD-10-CM | POA: Diagnosis not present

## 2016-07-27 DIAGNOSIS — R350 Frequency of micturition: Secondary | ICD-10-CM | POA: Diagnosis not present

## 2016-07-28 LAB — HEPATIC FUNCTION PANEL
ALT: 18 (ref 7–35)
AST: 16 (ref 13–35)
Alkaline Phosphatase: 90 (ref 25–125)
BILIRUBIN, TOTAL: 0.3

## 2016-07-29 DIAGNOSIS — I10 Essential (primary) hypertension: Secondary | ICD-10-CM | POA: Diagnosis not present

## 2016-07-29 DIAGNOSIS — M8580 Other specified disorders of bone density and structure, unspecified site: Secondary | ICD-10-CM | POA: Diagnosis not present

## 2016-07-29 DIAGNOSIS — E0590 Thyrotoxicosis, unspecified without thyrotoxic crisis or storm: Secondary | ICD-10-CM | POA: Diagnosis not present

## 2016-07-30 ENCOUNTER — Other Ambulatory Visit (HOSPITAL_COMMUNITY): Payer: Self-pay | Admitting: Endocrinology

## 2016-07-30 DIAGNOSIS — E0590 Thyrotoxicosis, unspecified without thyrotoxic crisis or storm: Secondary | ICD-10-CM

## 2016-08-10 ENCOUNTER — Encounter (HOSPITAL_COMMUNITY)
Admission: RE | Admit: 2016-08-10 | Discharge: 2016-08-10 | Disposition: A | Discharge status: Home / Self Care | Payer: Medicare HMO | Source: Ambulatory Visit | Attending: Endocrinology | Admitting: Endocrinology

## 2016-08-10 DIAGNOSIS — E0590 Thyrotoxicosis, unspecified without thyrotoxic crisis or storm: Secondary | ICD-10-CM | POA: Insufficient documentation

## 2016-08-10 MED ORDER — SODIUM IODIDE I 131 CAPSULE
9.0000 | Freq: Once | INTRAVENOUS | Status: AC | PRN
  Administered 2016-08-10: 9 via ORAL

## 2016-08-11 ENCOUNTER — Encounter (HOSPITAL_COMMUNITY)
Admission: RE | Admit: 2016-08-11 | Discharge: 2016-08-11 | Disposition: A | Discharge status: Home / Self Care | Payer: Medicare HMO | Source: Ambulatory Visit | Attending: Endocrinology | Admitting: Endocrinology

## 2016-08-11 DIAGNOSIS — E0590 Thyrotoxicosis, unspecified without thyrotoxic crisis or storm: Secondary | ICD-10-CM | POA: Diagnosis not present

## 2016-08-11 MED ORDER — SODIUM PERTECHNETATE TC 99M INJECTION
10.0000 | Freq: Once | INTRAVENOUS | Status: AC | PRN
  Administered 2016-08-11: 10 via INTRAVENOUS

## 2016-08-18 DIAGNOSIS — M47816 Spondylosis without myelopathy or radiculopathy, lumbar region: Secondary | ICD-10-CM | POA: Diagnosis not present

## 2016-08-18 DIAGNOSIS — I10 Essential (primary) hypertension: Secondary | ICD-10-CM | POA: Diagnosis not present

## 2016-08-18 DIAGNOSIS — Z6826 Body mass index (BMI) 26.0-26.9, adult: Secondary | ICD-10-CM | POA: Diagnosis not present

## 2016-09-01 ENCOUNTER — Emergency Department (HOSPITAL_COMMUNITY): Payer: Medicare HMO

## 2016-09-01 ENCOUNTER — Emergency Department (HOSPITAL_COMMUNITY)
Admission: EM | Admit: 2016-09-01 | Discharge: 2016-09-02 | Disposition: A | Discharge status: Home / Self Care | Payer: Medicare HMO | Attending: Emergency Medicine | Admitting: Emergency Medicine

## 2016-09-01 ENCOUNTER — Other Ambulatory Visit (HOSPITAL_COMMUNITY): Payer: Self-pay | Admitting: Endocrinology

## 2016-09-01 ENCOUNTER — Encounter (HOSPITAL_COMMUNITY): Payer: Self-pay

## 2016-09-01 DIAGNOSIS — I2510 Atherosclerotic heart disease of native coronary artery without angina pectoris: Secondary | ICD-10-CM | POA: Diagnosis not present

## 2016-09-01 DIAGNOSIS — Z79899 Other long term (current) drug therapy: Secondary | ICD-10-CM | POA: Diagnosis not present

## 2016-09-01 DIAGNOSIS — J449 Chronic obstructive pulmonary disease, unspecified: Secondary | ICD-10-CM | POA: Diagnosis not present

## 2016-09-01 DIAGNOSIS — Z951 Presence of aortocoronary bypass graft: Secondary | ICD-10-CM | POA: Insufficient documentation

## 2016-09-01 DIAGNOSIS — F17210 Nicotine dependence, cigarettes, uncomplicated: Secondary | ICD-10-CM | POA: Insufficient documentation

## 2016-09-01 DIAGNOSIS — I110 Hypertensive heart disease with heart failure: Secondary | ICD-10-CM | POA: Insufficient documentation

## 2016-09-01 DIAGNOSIS — I10 Essential (primary) hypertension: Secondary | ICD-10-CM

## 2016-09-01 DIAGNOSIS — I5030 Unspecified diastolic (congestive) heart failure: Secondary | ICD-10-CM | POA: Diagnosis not present

## 2016-09-01 DIAGNOSIS — R51 Headache: Secondary | ICD-10-CM | POA: Diagnosis not present

## 2016-09-01 DIAGNOSIS — Z85828 Personal history of other malignant neoplasm of skin: Secondary | ICD-10-CM | POA: Insufficient documentation

## 2016-09-01 DIAGNOSIS — E0590 Thyrotoxicosis, unspecified without thyrotoxic crisis or storm: Secondary | ICD-10-CM

## 2016-09-01 DIAGNOSIS — R0602 Shortness of breath: Secondary | ICD-10-CM | POA: Diagnosis not present

## 2016-09-01 LAB — BASIC METABOLIC PANEL
Anion gap: 10 (ref 5–15)
BUN: 12 mg/dL (ref 6–20)
CO2: 23 mmol/L (ref 22–32)
Calcium: 9.5 mg/dL (ref 8.9–10.3)
Chloride: 105 mmol/L (ref 101–111)
Creatinine, Ser: 0.64 mg/dL (ref 0.44–1.00)
GFR calc Af Amer: >60 mL/min (ref >60)
GFR calc non Af Amer: >60 mL/min (ref >60)
Glucose, Bld: 176 mg/dL — ABNORMAL HIGH (ref 65–99)
Potassium: 5.7 mmol/L — ABNORMAL HIGH (ref 3.5–5.1)
Sodium: 138 mmol/L (ref 135–145)

## 2016-09-01 LAB — CBC
HEMATOCRIT: 39.7 % (ref 36.0–46.0)
Hemoglobin: 12.9 g/dL (ref 12.0–15.0)
MCH: 30.1 pg (ref 26.0–34.0)
MCHC: 32.5 g/dL (ref 30.0–36.0)
MCV: 92.5 fL (ref 78.0–100.0)
Platelets: 293 10*3/uL (ref 150–400)
RBC: 4.29 MIL/uL (ref 3.87–5.11)
RDW: 13.3 % (ref 11.5–15.5)
WBC: 11.7 10*3/uL — AB (ref 4.0–10.5)

## 2016-09-01 NOTE — ED Notes (Signed)
Delay in lab draw,  Pt not in room 

## 2016-09-01 NOTE — ED Triage Notes (Signed)
Pt states that today BP has been elevated in the 003 and 704'U systolic along with some SOB. States she has had a headache for the past two weeks and the SOB comes and goes with activities.

## 2016-09-01 NOTE — ED Notes (Signed)
Pt presents with HTN for last day or two, pt did have HA and shob that has since resolved.  Daughter at bedside with list of bp's today. Pt is neuro intact with no complaints of pain or shob at this time.

## 2016-09-01 NOTE — ED Notes (Signed)
Pt to CT via stretcher

## 2016-09-01 NOTE — ED Provider Notes (Signed)
[facility] DEPT Provider Note   CSN: [id number] Arrival date & time: 09/01/16  1844   By signing my name below, I, Meredith Gutierrez, attest that this documentation has been prepared under the direction and in the presence of Meredith Hacker, MD . Electronically Signed: Neta Gutierrez, ED Scribe. 09/01/2016. 11:25 PM.   History   Chief Complaint Chief Complaint  Patient presents with  . Hypertension  . Shortness of Breath   The history is provided by the patient. No language interpreter was used.   HPI Comments:  Meredith Gutierrez is a 76 y.o. female who presents to the Emergency Department, here due to increased BP since yesterday. Daughter reports that her systolic BP was measured in the 200s. Pt checks her BP twice a day, and her normal BP is ~140/80.  Pt states that she started to have a headache and SOB 2 days ago, which have resolved. Pt has been taking her normal BP medications regularly, and recently began taking hydrocodone for back pain. She states that she feels normal at this time. Pt has not contacted her PCP. Pt denies chest pain. Patient is currently assymptomatic.  Past Medical History:  Diagnosis Date  . Angina    never needed to take nitroglycerin.  . Anxiety   . Cancer ([place])    skin cancers, 1 melanoma removed back -3 months ago  . Cerebral atherosclerosis   . COPD (chronic obstructive pulmonary disease) ([place])    02-16-13-"pt. denies this"-shows on CXR of 2- 2013.  Marland Kitchen Coronary artery disease   . Diastolic CHF, on admit treated. 08/10/2011  . Diastolic dysfunction 2/77/8242  . History of kidney stones 02-16-13   "hx. of overgrowth of muscle-causing some stricture"renal artery stenosis-being followed [facility]  . Hypertension   . Sinus congestion    occ. tx. with Loratadine    Patient Active Problem List   Diagnosis Date Noted  . History of left renal artery stenosis 02/14/2016  . Right posterior capsular opacification 08/23/2014  . Diarrhea 03/02/2013    . Preop cardiovascular exam 02/09/2013  . After-cataract 09/06/2011  . Diastolic dysfunction 35/36/1443  . Diastolic CHF, on admit treated. 08/10/2011  . Dyslipidemia, statin intol. 08/07/2011  . Sinus bradycardia, beta blocker intol. 08/07/2011  . Chest pain, negative MI, cath with atretic LIMA, and excellent flow down the native LAD,patent grafts otherwise 08/05/2011  . HTN (hypertension), accelerated on admit 08/05/2011  . Tobacco abuse 08/05/2011  . CAD (coronary artery disease), CABG 08/24/2001, off pump, LIMA to LAD, SVG to 1st diagonal. Ligation of LAD artery aneurysm. 08/05/2011    Past Surgical History:  Procedure Laterality Date  . ABDOMINAL HYSTERECTOMY    . APPENDECTOMY    . CARDIAC CATHETERIZATION  08/09/2011   no sign. coronary obstruction. LIMA is atretic w/excellent flow down the native LAD  . CARDIAC CATHETERIZATION  08/22/2001   no sign. coronary stenosis,  . CATARACT EXTRACTION, BILATERAL Bilateral   . CESAREAN SECTION    . CORONARY ARTERY BYPASS GRAFT  02-16-13   3'03-no bypasses -Had 2 aneurysm repairs(stents used)  . LAPAROTOMY N/A 02/20/2013   Procedure: EXPLORATORY LAPAROTOMY;  Surgeon: [name], MD;  Location: [facility];  Service: Gynecology;  Laterality: N/A;  . LEFT HEART CATHETERIZATION WITH CORONARY ANGIOGRAM N/A 08/09/2011   Procedure: LEFT HEART CATHETERIZATION WITH CORONARY ANGIOGRAM;  Surgeon: Sanda Klein, MD;  Location: [facility]ATH [facility];  Service: Cardiovascular;  Laterality: N/A;  . NM MYOCAR PERF WALL MOTION  07/20/2010   normal  .  SALPINGOOPHORECTOMY Bilateral 02/20/2013   Procedure: BILATERAL SALPINGO OOPHORECTOMY;  Surgeon: [name], MD;  Location: [facility];  Service: Gynecology;  Laterality: Bilateral;    OB History    No data available       Home Medications    Prior to Admission medications   Medication Sig Start Date End Date Taking? Authorizing Provider  ALPRAZolam (NIRAVAM) 0.25 MG dissolvable tablet Take 0.25 mg by mouth 2 (two)  times daily.    Yes Historical Provider, MD  aspirin EC 81 MG tablet Take 81 mg by mouth daily.   Yes Historical Provider, MD  diltiazem (CARDIZEM CD) 120 MG 24 hr capsule TAKE 1 CAPSULE BY MOUTH DAILY. 02/17/16  Yes Mihai Croitoru, MD  HYDROcodone-acetaminophen (NORCO) 10-325 MG tablet Take 1 tablet by mouth every 8 (eight) hours as needed for moderate pain.  08/10/16  Yes Historical Provider, MD  valsartan (DIOVAN) 160 MG tablet Take 1 tablet (160 mg total) by mouth daily. 05/05/16  Yes Sanda Klein, MD    Family History Family History  Problem Relation Age of Onset  . Stroke Mother   . Hypotension Mother   . Coronary artery disease Father   . Hypotension Father   . Breast cancer Maternal Aunt   . Hypotension Sister   . Hypotension Brother     Social History Social History  Substance Use Topics  . Smoking status: Current Every Day Smoker    Packs/day: 0.10    Years: 20.00    Types: Cigarettes  . Smokeless tobacco: Never Used     Comment: smoking cessation info given  . Alcohol use No     Allergies   Codeine; Penicillin g; Prednisolone acetate; Meloxicam; Nitroglycerin; Crestor [rosuvastatin calcium]; Lopressor [metoprolol tartrate]; Penicillins; Prednisone; Sulfamethoxazole; and Zetia [ezetimibe]   Review of Systems Review of Systems  Respiratory: Positive for shortness of breath.   Cardiovascular: Negative for chest pain.  Gastrointestinal: Negative for abdominal pain, nausea and vomiting.  Neurological: Positive for headaches. Negative for weakness.  All other systems reviewed and are negative.    Physical Exam Updated Vital Signs BP (!) 147/67   Pulse 69   Temp 98.5 F (36.9 C) (Oral)   Resp 18   Ht 5\' 1"  (1.549 m)   Wt 145 lb (65.8 kg)   SpO2 96%   BMI 27.40 kg/m   Physical Exam  Constitutional: She is oriented to person, place, and time. She appears well-developed and well-nourished.  HENT:  Head: Normocephalic and atraumatic.  Eyes: Pupils are  equal, round, and reactive to light.  Cardiovascular: Normal rate, regular rhythm and normal heart sounds.   No murmur heard. Pulmonary/Chest: Effort normal and breath sounds normal. No respiratory distress. She has no wheezes.  Abdominal: Soft. Bowel sounds are normal. There is no tenderness. There is no guarding.  Neurological: She is alert and oriented to person, place, and time.  Cranial nerves II through XII intact, 5 out of 5 strength in all 4 extremities, no dysmetria to finger-nose-finger  Skin: Skin is warm and dry.  Psychiatric: She has a normal mood and affect.  Nursing note and vitals reviewed.    ED Treatments / Results  DIAGNOSTIC STUDIES:  Oxygen Saturation is 100% on RA, normal by my interpretation.    COORDINATION OF CARE:  11:21 PM Discussed treatment plan with pt at bedside and pt agreed to plan.   Labs (all labs ordered are listed, but only abnormal results are displayed) Labs Reviewed  CBC - Abnormal; Notable for  the following:       Result Value   WBC 11.7 (*)    All other components within normal limits  BASIC METABOLIC PANEL - Abnormal; Notable for the following:    Potassium 5.7 (*)    Glucose, Bld 176 (*)    All other components within normal limits  TROPONIN I  POTASSIUM    EKG  EKG Interpretation  Date/Time:  Wednesday September 01 2016 19:25:24 EDT Ventricular Rate:  77 PR Interval:  166 QRS Duration: 78 QT Interval:  374 QTC Calculation: 423 R Axis:   19 Text Interpretation:  Normal sinus rhythm with sinus arrhythmia Cannot rule out Inferior infarct , age undetermined Abnormal ECG No longer bradycardic No significant change since last tracing Confirmed by Dina Rich  MD, COURTNEY ([id number]) on 09/01/2016 11:07:37 PM       Radiology Dg Chest 2 View  Result Date: 09/01/2016 CLINICAL DATA:  Shortness of breath, headache for 2 weeks EXAM: CHEST  2 VIEW COMPARISON:  06/27/2013 FINDINGS: Cardiomediastinal silhouette is stable. Status post CABG. No  infiltrate or pleural effusion. No pulmonary edema. Osteopenia and mild degenerative changes thoracic spine. IMPRESSION: No active cardiopulmonary disease. Electronically Signed   By: Lahoma Crocker M.D.   On: 09/01/2016 19:43   Ct Head Wo Contrast  Result Date: 09/01/2016 CLINICAL DATA:  76 year old female with hypertension and headache. EXAM: CT HEAD WITHOUT CONTRAST TECHNIQUE: Contiguous axial images were obtained from the base of the skull through the vertex without intravenous contrast. COMPARISON:  Head CT dated 06/12/2010 FINDINGS: Brain: There is moderate age-related atrophy and chronic microvascular ischemic changes. A 6 x 8 mm focal hypodensity in the anterior horn of the right internal capsule (series 2 1 image 46) is age indeterminate, likely chronic. Clinical correlation is recommended. MRI may provide better evaluation if there is clinical concern for acute infarct. There is no acute intracranial hemorrhage. No mass effect or midline shift noted. No extra-axial fluid collection. Vascular: No hyperdense vessel or unexpected calcification. Skull: Normal. Negative for fracture or focal lesion. Sinuses/Orbits: No acute finding. Other: None. IMPRESSION: 1. No acute intracranial hemorrhage. 2. Moderate age-related atrophy and chronic microvascular ischemic changes. 3. Subcentimeter hypodense focus in the anterior horn of the right internal capsule is age indeterminate. MRI is recommended for further evaluation if there is clinical concern for acute infarct. Electronically Signed   By: Anner Crete M.D.   On: 09/01/2016 23:51    Procedures Procedures (including critical care time)  Medications Ordered in ED Medications - No data to display   Initial Impression / Assessment and Plan / ED Course  I have reviewed the triage vital signs and the nursing notes.  Pertinent labs & imaging results that were available during my care of the patient were reviewed by me and considered in my medical  decision making (see chart for details).     Patient presents with hypertension. Reports headache and shortness of breath over the last 24 hours. She is currently asymptomatic. Initial blood pressures elevated in the 190s. However on my initial evaluation patient blood pressure 160s over 90s. She is well-appearing. Nontoxic. Neurologically intact. Basic labwork is reassuring. Initial potassium 5.7. Repeat is 4.1. Likely lab error. EKG is reassuring. CT head shows a hypodense focus which is age indeterminate. Could be an old stroke. Patient is neurologically intact without strokelike symptoms. Doubt clinical significance at this time. Chest x-ray is reassuring. Patient has been asymptomatic while in the emergency room. On recheck her blood pressure is 147/67.  She is reassured. I have encouraged her to monitor her blood pressures at home. Keep a log of symptoms and blood pressures. Follow-up with primary physician in one to 2 days for recheck of blood pressure and to make sure her home blood pressure cuff is calibrated appropriately. Patient stated understanding.  After history, exam, and medical workup I feel the patient has been appropriately medically screened and is safe for discharge home. Pertinent diagnoses were discussed with the patient. Patient was given return precautions.   Final Clinical Impressions(s) / ED Diagnoses   Final diagnoses:  Essential hypertension    New Prescriptions New Prescriptions   No medications on file   I personally performed the services described in this documentation, which was scribed in my presence. The recorded information has been reviewed and is accurate.     Meredith Hacker, MD 09/02/16 [phone]

## 2016-09-02 ENCOUNTER — Telehealth: Payer: Self-pay | Admitting: Cardiovascular Disease

## 2016-09-02 LAB — POTASSIUM: POTASSIUM: 4.1 mmol/L (ref 3.5–5.1)

## 2016-09-02 LAB — TROPONIN I: Troponin I: <0.03 ng/mL (ref <0.03)

## 2016-09-02 NOTE — Discharge Instructions (Signed)
You were seen today for high blood pressure. Your symptoms and her blood pressures resolved. Your workup is largely reassuring. Keep a log of her blood pressures at home. Continue to take her blood pressure medication as prescribed. Follow up with your primary physician in one to 2 days for blood pressure recheck and to check your home blood pressure cuff to make sure that it correlates with office blood pressures. If you develop worsening symptoms, headache, shortness of breath, chest pain you should be reevaluated.

## 2016-09-02 NOTE — Telephone Encounter (Signed)
Pt c/o BP issue: STAT if pt c/o blurred vision, one-sided weakness or slurred speech  1. What are your last 5 BP readings? 189/103 (09-02-16 in the am)  2. Are you having any other symptoms (ex. Dizziness, headache, blurred vision, passed out)? Headaches for last two days  3. What is your BP issue? Elevated BP  BP issues going on for three 3 days.

## 2016-09-02 NOTE — Telephone Encounter (Signed)
Spoke with pt daughter carol she states that pt went to the ER yesterday (her BP there was 147/67) she states that pt has appt tomorrow *(09-03-16 at 10am) and states that her BP today is 189/103 and pt has headache again today has had for the last 2 days. ER said they did not want to adjust medications and wanted to leave that to Dr C. She further all the labs, xray, CT and ECG were all fine per ER. She just wants to update Dr C and see if he wants to do anything today or just wait until appt tomorrow? Please advise

## 2016-09-02 NOTE — Telephone Encounter (Signed)
Please have her take another 160 mg of Valsartan today. MCr

## 2016-09-02 NOTE — Telephone Encounter (Signed)
Daughter carol notified she will call back if anything else is needed. She will discuss medications at the appt tomorrow

## 2016-09-02 NOTE — ED Notes (Signed)
EDP at bedside  

## 2016-09-03 ENCOUNTER — Encounter: Payer: Self-pay | Admitting: Cardiovascular Disease

## 2016-09-03 ENCOUNTER — Ambulatory Visit (INDEPENDENT_AMBULATORY_CARE_PROVIDER_SITE_OTHER): Payer: Medicare HMO | Admitting: Cardiovascular Disease

## 2016-09-03 VITALS — BP 152/100 | HR 76 | Ht 61.5 in | Wt 140.0 lb

## 2016-09-03 DIAGNOSIS — I5032 Chronic diastolic (congestive) heart failure: Secondary | ICD-10-CM | POA: Diagnosis not present

## 2016-09-03 DIAGNOSIS — E785 Hyperlipidemia, unspecified: Secondary | ICD-10-CM

## 2016-09-03 DIAGNOSIS — I10 Essential (primary) hypertension: Secondary | ICD-10-CM

## 2016-09-03 DIAGNOSIS — I701 Atherosclerosis of renal artery: Secondary | ICD-10-CM

## 2016-09-03 DIAGNOSIS — I25810 Atherosclerosis of coronary artery bypass graft(s) without angina pectoris: Secondary | ICD-10-CM | POA: Diagnosis not present

## 2016-09-03 DIAGNOSIS — Z720 Tobacco use: Secondary | ICD-10-CM

## 2016-09-03 MED ORDER — DILTIAZEM HCL ER COATED BEADS 240 MG PO CP24: 240.0000 mg | ORAL_CAPSULE | Freq: Every day | ORAL | 3 refills | Status: DC

## 2016-09-03 MED ORDER — VALSARTAN 320 MG PO TABS: 320.0000 mg | ORAL_TABLET | Freq: Every day | ORAL | 3 refills | Status: DC

## 2016-09-03 NOTE — Patient Instructions (Signed)
Medication Instructions: Dr Sallyanne Kuster has recommended making the following medication changes: 1. INCREASE Valsartan to 320 mg daily 2. INCREASE Diltiazem to 240 mg daily 3. STOP ALL NSAID PAIN RELIEVERS  Labwork: NONE ORDERED  Testing/Procedures: 1. Renal Artery Dopplers - Your physician has requested that you have a renal artery duplex. During this test, an ultrasound is used to evaluate blood flow to the kidneys. Allow one hour for this exam. Do not eat after midnight the day before and avoid carbonated beverages. Take your medications as you usually do.  Follow-up: Your physician recommends that you schedule a follow-up appointment in 2 weeks with one of our clinical pharmacists for blood pressure management and to discuss Repatha.  Dr Sallyanne Kuster recommends that you schedule a follow-up appointment first available. I will call you to make this appointment.  If you need a refill on your cardiac medications before your next appointment, please call your pharmacy.

## 2016-09-03 NOTE — Progress Notes (Signed)
Cardiology Office Note    Date:  09/03/2016   ID:  [name], [name] 1940/08/15, MRN [id number]  PCP:  Greig Right, MD  Cardiologist:   Sanda Klein, MD   Chief Complaint  Patient presents with  . Follow-up    CAD/Hypertension    History of Present Illness:  Meredith Gutierrez is a 76 y.o. female with CAD S/P CABG (2003 LIMA to LAD and SVG to first diagonal, ligation of large LAD artery aneurysm, atretic LIMA with patent LAD at cardiac cath in 2013), without coronary events since that time. She has hyperdynamic left ventricular systolic function, moderate LVH, diastolic dysfunction by echo, but no clinical heart failure since 2013. She has hyperlipidemia but has been intolerant to numerous attempts at lipid lowering therapy (rosuvastatin, simvastatin, Zetia and others) and is unable to take beta blockers due to bradycardia.   She has hyperthyroidism and sees Dr. Chalmers Cater and very recently received radioactive iodine with curative intent. His been taking ibuprofen twice daily for back pain for several weeks.Marland Kitchen  She was seen in the emergency room on March 21 where she presented with abruptly elevated blood pressure (systolic in the 413K) accompanied by headache and some dyspnea. She has not complained of angina. Workup was benign without any ECG changes, a small chroinic-appearing abnormality on head CT, creatinine stable at 0.64 and normal potassium and cardiac enzymes. She feels a little better, but her blood pressure remains markedly elevated despite taking a double dose of valsartan. Today in clinic her blood pressure was 152/100. Her lowest blood pressure in the last few days was 157/93 (after taking the second dose of valsartan).  He was previously diagnosed as having renal artery stenosis, but the most recent duplex ultrasound study performed in 2016 reported normal findings.  Past Medical History:  Diagnosis Date  . Angina    never needed to take nitroglycerin.  . Anxiety   .  Cancer ([place])    skin cancers, 1 melanoma removed back -3 months ago  . Cerebral atherosclerosis   . COPD (chronic obstructive pulmonary disease) ([place])    02-16-13-"pt. denies this"-shows on CXR of 2- 2013.  Marland Kitchen Coronary artery disease   . Diastolic CHF, on admit treated. 08/10/2011  . Diastolic dysfunction 4/40/1027  . History of kidney stones 02-16-13   "hx. of overgrowth of muscle-causing some stricture"renal artery stenosis-being followed [facility]  . Hypertension   . Sinus congestion    occ. tx. with Loratadine    Past Surgical History:  Procedure Laterality Date  . ABDOMINAL HYSTERECTOMY    . APPENDECTOMY    . CARDIAC CATHETERIZATION  08/09/2011   no sign. coronary obstruction. LIMA is atretic w/excellent flow down the native LAD  . CARDIAC CATHETERIZATION  08/22/2001   no sign. coronary stenosis,  . CATARACT EXTRACTION, BILATERAL Bilateral   . CESAREAN SECTION    . CORONARY ARTERY BYPASS GRAFT  02-16-13   3'03-no bypasses -Had 2 aneurysm repairs(stents used)  . LAPAROTOMY N/A 02/20/2013   Procedure: EXPLORATORY LAPAROTOMY;  Surgeon: [name], MD;  Location: [facility];  Service: Gynecology;  Laterality: N/A;  . LEFT HEART CATHETERIZATION WITH CORONARY ANGIOGRAM N/A 08/09/2011   Procedure: LEFT HEART CATHETERIZATION WITH CORONARY ANGIOGRAM;  Surgeon: Sanda Klein, MD;  Location: [facility];  Service: Cardiovascular;  Laterality: N/A;  . NM MYOCAR PERF WALL MOTION  07/20/2010   normal  . SALPINGOOPHORECTOMY Bilateral 02/20/2013   Procedure: BILATERAL SALPINGO OOPHORECTOMY;  Surgeon: [name], MD;  Location: [facility];  Service:  Gynecology;  Laterality: Bilateral;    Current Medications: Outpatient Medications Prior to Visit  Medication Sig Dispense Refill  . ALPRAZolam (NIRAVAM) 0.25 MG dissolvable tablet Take 0.25 mg by mouth 2 (two) times daily.     Marland Kitchen aspirin EC 81 MG tablet Take 81 mg by mouth daily.    Marland Kitchen HYDROcodone-acetaminophen (NORCO) 10-325 MG tablet Take 1 tablet by mouth  every 8 (eight) hours as needed for moderate pain.     Marland Kitchen diltiazem (CARDIZEM CD) 120 MG 24 hr capsule TAKE 1 CAPSULE BY MOUTH DAILY. 30 capsule 8  . valsartan (DIOVAN) 160 MG tablet Take 1 tablet (160 mg total) by mouth daily. 90 tablet 3   No facility-administered medications prior to visit.      Allergies:   Codeine; Penicillin g; Prednisolone acetate; Meloxicam; Nitroglycerin; Crestor [rosuvastatin calcium]; Lopressor [metoprolol tartrate]; Penicillins; Prednisone; Sulfamethoxazole; and Zetia [ezetimibe]   Social History   Social History  . Marital status: Widowed    Spouse name: N/A  . Number of children: N/A  . Years of education: N/A   Social History Main Topics  . Smoking status: Current Every Day Smoker    Packs/day: 0.10    Years: 20.00    Types: Cigarettes  . Smokeless tobacco: Never Used     Comment: smoking cessation info given  . Alcohol use No  . Drug use: No  . Sexual activity: No   Other Topics Concern  . None   Social History Narrative  . None     Family History:  The patient's family history includes Breast cancer in her maternal aunt; Coronary artery disease in her father; Hypotension in her brother, father, mother, and sister; Stroke in her mother.   ROS:   Please see the history of present illness.    ROS All other systems reviewed and are negative.   PHYSICAL EXAM:   VS:  BP (!) 152/100   Pulse 76   Ht 5' 1.5" (1.562 m)   Wt 63.5 kg (140 lb)   BMI 26.02 kg/m    GEN: Well nourished, well developed, in no acute distress  HEENT: normal  Neck: no JVD, carotid bruits, or masses Cardiac: RRR; no murmurs, rubs, or gallops,no edema  Respiratory:  clear to auscultation bilaterally, normal work of breathing GI: soft, nontender, nondistended, + BS MS: no deformity or atrophy  Skin: warm and dry, no rash Neuro:  Alert and Oriented x 3, Strength and sensation are intact Psych: euthymic mood, full affect  Wt Readings from Last 3 Encounters:    09/03/16 63.5 kg (140 lb)  09/01/16 65.8 kg (145 lb)  02/13/16 65.4 kg (144 lb 3.2 oz)      Studies/Labs Reviewed:   EKG:  EKG is ordered today.  The ekg ordered today demonstrates NSR, normal tracing  Recent Labs: 09/01/2016: BUN 12; Creatinine, Ser 0.64; Hemoglobin 12.9; Platelets 293; Sodium 138 09/02/2016: Potassium 4.1   Lipid Panel    Component Value Date/Time   CHOL 187 08/06/2011 0046   TRIG 156 (H) 08/06/2011 0046   HDL 35 (L) 08/06/2011 0046   CHOLHDL 5.3 08/06/2011 0046   VLDL 31 08/06/2011 0046   LDLCALC 121 (H) 08/06/2011 0046    ASSESSMENT:    1. Essential hypertension   2. Renal artery stenosis ([place])   3. Coronary artery disease involving autologous artery coronary bypass graft without angina pectoris   4. Chronic diastolic congestive heart failure ([place])   5. Dyslipidemia, statin intol.   6. Tobacco abuse  PLAN:  In order of problems listed above:  1. HTN: Well controlled until recently, now with markedly elevated systolic and diastolic values despite more than typical dose of antihypertensives. Renal function is stable. Possible contributory effect of thyrotoxicosis and increased use of NSAIDs. Need to make sure she does not have true renal artery stenosis and have requested a repeat ultrasound study. Meanwhile will double the dose of both her diltiazem and the valsartan. Note history of intolerance to beta blockers. 2. History of left renal artery stenosis: Reported on previous studies, not confirmed on last duplex ultrasound performed December 2016. May need percutaneous intervention if we cannot control hypertension or for deteriorating renal function. 3. CAD: asymptomatic, most recent cardiac catheterization with low risk anatomy. 4. CHF: She appears clinically euvolemic, NYHA functional class I. As far as I know, she has had a single episode of clinical heart failure related to poorly controlled hypertension in February 2013. At that time her cardiac  catheterization showed LVEDP 28 mmHg. Diuretic therapy is currently not necessary for management of volume.. 5. HLP: Statin intolerance with multiple attempts. Triedt red-yeast-rice at any benefit. I'm sure she has heterozygous familial hypercholesterolemia. Will re-evaluate her more recent lipid profile. Try to see if we can get financial support for Repatha or Praluent. 6. Tobacco abuse: She is a very light cigarette smoker.Strongly encouraged her to quit smoking altogether.  Medication Adjustments/Labs and Tests Ordered: Current medicines are reviewed at length with the patient today.  Concerns regarding medicines are outlined above.  Medication changes, Labs and Tests ordered today are listed in the Patient Instructions below. Patient Instructions  Medication Instructions: Dr Sallyanne Kuster has recommended making the following medication changes: 1. INCREASE Valsartan to 320 mg daily 2. INCREASE Diltiazem to 240 mg daily 3. STOP ALL NSAID PAIN RELIEVERS  Labwork: NONE ORDERED  Testing/Procedures: 1. Renal Artery Dopplers - Your physician has requested that you have a renal artery duplex. During this test, an ultrasound is used to evaluate blood flow to the kidneys. Allow one hour for this exam. Do not eat after midnight the day before and avoid carbonated beverages. Take your medications as you usually do.  Follow-up: Your physician recommends that you schedule a follow-up appointment in 2 weeks with one of our clinical pharmacists for blood pressure management and to discuss Repatha.  Dr Sallyanne Kuster recommends that you schedule a follow-up appointment first available. I will call you to make this appointment.  If you need a refill on your cardiac medications before your next appointment, please call your pharmacy.    Signed, Sanda Klein, MD  09/03/2016 3:07 PM    [facility] [place], [place], [place]  [address] Phone: [phone]; Fax: [phone]

## 2016-09-07 ENCOUNTER — Ambulatory Visit (HOSPITAL_COMMUNITY)
Admission: RE | Admit: 2016-09-07 | Discharge: 2016-09-07 | Disposition: A | Discharge status: Home / Self Care | Payer: Medicare HMO | Source: Ambulatory Visit | Attending: Endocrinology | Admitting: Endocrinology

## 2016-09-07 DIAGNOSIS — E0590 Thyrotoxicosis, unspecified without thyrotoxic crisis or storm: Secondary | ICD-10-CM | POA: Diagnosis not present

## 2016-09-07 MED ORDER — SODIUM IODIDE I 131 CAPSULE
34.6000 | Freq: Once | INTRAVENOUS | Status: AC | PRN
  Administered 2016-09-07: 34.6 via ORAL

## 2016-09-17 ENCOUNTER — Encounter: Payer: Self-pay | Admitting: Cardiovascular Disease

## 2016-09-17 ENCOUNTER — Ambulatory Visit (INDEPENDENT_AMBULATORY_CARE_PROVIDER_SITE_OTHER): Payer: Medicare HMO | Admitting: Cardiovascular Disease

## 2016-09-17 VITALS — BP 120/70 | HR 82 | Ht 61.5 in | Wt 137.4 lb

## 2016-09-17 DIAGNOSIS — I10 Essential (primary) hypertension: Secondary | ICD-10-CM | POA: Diagnosis not present

## 2016-09-17 DIAGNOSIS — I2510 Atherosclerotic heart disease of native coronary artery without angina pectoris: Secondary | ICD-10-CM

## 2016-09-17 DIAGNOSIS — E785 Hyperlipidemia, unspecified: Secondary | ICD-10-CM | POA: Diagnosis not present

## 2016-09-17 DIAGNOSIS — Z720 Tobacco use: Secondary | ICD-10-CM | POA: Diagnosis not present

## 2016-09-17 DIAGNOSIS — I5032 Chronic diastolic (congestive) heart failure: Secondary | ICD-10-CM

## 2016-09-17 NOTE — Patient Instructions (Signed)
Dr Croitoru recommends that you schedule a follow-up appointment in 1 year. You will receive a reminder letter in the mail two months in advance. If you don't receive a letter, please call our office to schedule the follow-up appointment.  If you need a refill on your cardiac medications before your next appointment, please call your pharmacy. 

## 2016-09-17 NOTE — Progress Notes (Signed)
Cardiology Office Note    Date:  09/17/2016   ID:  Meredith Gutierrez, DOB 12/29/1940, MRN [id number]  PCP:  Greig Right, MD  Cardiologist:   Sanda Klein, MD   Chief Complaint  Patient presents with  . Follow-up    History of Present Illness:  Meredith Gutierrez is a 76 y.o. female with CAD S/P CABG (2003 LIMA to LAD and SVG to first diagonal, ligation of large LAD artery aneurysm, atretic LIMA with patent LAD at cardiac cath in 2013), without coronary events since that time. She has hyperdynamic left ventricular systolic function, moderate LVH, diastolic dysfunction by echo, but no clinical heart failure since 2013. She has hyperlipidemia but has been intolerant to numerous attempts at lipid lowering therapy (rosuvastatin, simvastatin, Zetia and others) and is unable to take beta blockers due to bradycardia.   She has hyperthyroidism and sees Dr. Chalmers Cater and very recently received radioactive iodine with curative intent. His been taking ibuprofen twice daily for back pain for several weeks.Marland Kitchen  She was seen in the emergency room on March 21 where she presented with abruptly elevated blood pressure (systolic in the 563S) accompanied by headache and some dyspnea. She has not complained of angina. Workup was benign without any ECG changes, a small chroinic-appearing abnormality on head CT, creatinine stable at 0.64 and normal potassium and cardiac enzymes. She feels a little better, but her blood pressure remains markedly elevated despite taking a double dose of valsartan. Today in clinic her blood pressure was 152/100. Her lowest blood pressure in the last few days was 157/93 (after taking the second dose of valsartan).  He was previously diagnosed as having renal artery stenosis, but the most recent duplex ultrasound study performed in 2016 reported normal findings.  Past Medical History:  Diagnosis Date  . Angina    never needed to take nitroglycerin.  . Anxiety   . Cancer ([place])    skin  cancers, 1 melanoma removed back -3 months ago  . Cerebral atherosclerosis   . COPD (chronic obstructive pulmonary disease) ([place])    02-16-13-"pt. denies this"-shows on CXR of 2- 2013.  Marland Kitchen Coronary artery disease   . Diastolic CHF, on admit treated. 08/10/2011  . Diastolic dysfunction 9/37/3428  . History of kidney stones 02-16-13   "hx. of overgrowth of muscle-causing some stricture"renal artery stenosis-being followed [facility]  . Hypertension   . Sinus congestion    occ. tx. with Loratadine    Past Surgical History:  Procedure Laterality Date  . ABDOMINAL HYSTERECTOMY    . APPENDECTOMY    . CARDIAC CATHETERIZATION  08/09/2011   no sign. coronary obstruction. LIMA is atretic w/excellent flow down the native LAD  . CARDIAC CATHETERIZATION  08/22/2001   no sign. coronary stenosis,  . CATARACT EXTRACTION, BILATERAL Bilateral   . CESAREAN SECTION    . CORONARY ARTERY BYPASS GRAFT  02-16-13   3'03-no bypasses -Had 2 aneurysm repairs(stents used)  . LAPAROTOMY N/A 02/20/2013   Procedure: EXPLORATORY LAPAROTOMY;  Surgeon: [name], MD;  [facility]: [facility];  Service: Gynecology;  Laterality: N/A;  . LEFT HEART CATHETERIZATION WITH CORONARY ANGIOGRAM N/A 08/09/2011   Procedure: LEFT HEART CATHETERIZATION WITH CORONARY ANGIOGRAM;  Surgeon: Sanda Klein, MD;  Location: [facility];  Service: Cardiovascular;  Laterality: N/A;  . NM MYOCAR PERF WALL MOTION  07/20/2010   normal  . SALPINGOOPHORECTOMY Bilateral 02/20/2013   Procedure: BILATERAL SALPINGO OOPHORECTOMY;  Surgeon: [name], MD;  Location: [facility];  Service: Gynecology;  Laterality: Bilateral;  Current Medications: Outpatient Medications Prior to Visit  Medication Sig Dispense Refill  . ALPRAZolam (NIRAVAM) 0.25 MG dissolvable tablet Take 0.25 mg by mouth 2 (two) times daily.     Marland Kitchen aspirin EC 81 MG tablet Take 81 mg by mouth daily.    Marland Kitchen diltiazem (CARDIZEM CD) 240 MG 24 hr capsule Take 1 capsule (240 mg total) by mouth daily. 90  capsule 3  . HYDROcodone-acetaminophen (NORCO) 10-325 MG tablet Take 1 tablet by mouth every 8 (eight) hours as needed for moderate pain.     . valsartan (DIOVAN) 320 MG tablet Take 1 tablet (320 mg total) by mouth daily. 90 tablet 3   No facility-administered medications prior to visit.      Allergies:   Codeine; Penicillin g; Penicillins; Prednisolone acetate; Meloxicam; Nitroglycerin; Crestor [rosuvastatin calcium]; Lopressor [metoprolol tartrate]; Prednisone; Sulfamethoxazole; and Zetia [ezetimibe]   Social History   Social History  . Marital status: Widowed    Spouse name: N/A  . Number of children: N/A  . Years of education: N/A   Social History Main Topics  . Smoking status: Current Every Day Smoker    Packs/day: 0.10    Years: 20.00    Types: Cigarettes  . Smokeless tobacco: Never Used     Comment: smoking cessation info given  . Alcohol use No  . Drug use: No  . Sexual activity: No   Other Topics Concern  . None   Social History Narrative  . None     Family History:  The patient's family history includes Breast cancer in her maternal aunt; Coronary artery disease in her father; Hypotension in her brother, father, mother, and sister; Stroke in her mother.   ROS:   Please see the history of present illness.    ROS All other systems reviewed and are negative.   PHYSICAL EXAM:   VS:  BP 120/70   Pulse 82   Ht 5' 1.5" (1.562 m)   Wt 62.3 kg (137 lb 6.4 oz)   BMI 25.54 kg/m    GEN: Well nourished, well developed, in no acute distress  HEENT: normal  Neck: no JVD, carotid bruits, or masses Cardiac: RRR; no murmurs, rubs, or gallops,no edema  Respiratory:  clear to auscultation bilaterally, normal work of breathing GI: soft, nontender, nondistended, + BS MS: no deformity or atrophy  Skin: warm and dry, no rash Neuro:  Alert and Oriented x 3, Strength and sensation are intact Psych: euthymic mood, full affect  Wt Readings from Last 3 Encounters:  09/17/16  62.3 kg (137 lb 6.4 oz)  09/03/16 63.5 kg (140 lb)  09/01/16 65.8 kg (145 lb)      Studies/Labs Reviewed:   EKG:  EKG is ordered today.  The ekg ordered today demonstrates NSR, normal tracing  Recent Labs: 09/01/2016: BUN 12; Creatinine, Ser 0.64; Hemoglobin 12.9; Platelets 293; Sodium 138 09/02/2016: Potassium 4.1   Lipid Panel    Component Value Date/Time   CHOL 187 08/06/2011 0046   TRIG 156 (H) 08/06/2011 0046   HDL 35 (L) 08/06/2011 0046   CHOLHDL 5.3 08/06/2011 0046   VLDL 31 08/06/2011 0046   LDLCALC 121 (H) 08/06/2011 0046    ASSESSMENT:    1. Essential hypertension   2. Coronary artery disease involving native coronary artery of native heart without angina pectoris   3. Chronic diastolic heart failure ([place])   4. Dyslipidemia, statin intol.   5. Tobacco abuse      PLAN:  In order of problems  listed above:  1. HTN: Well controlled again, by withholding NSAIDs and increasing medications. Renal function is stable. Possible contributory effect of thyrotoxicosis. Repeat renal artery ultrasound is scheduled for next week. Note history of intolerance to beta blockers. History of left renal artery stenosis: Reported on previous studies, not confirmed on last duplex ultrasound performed December 2016.  2. CAD: asymptomatic, most recent cardiac catheterization with low risk anatomy. 3. CHF: She appears clinically euvolemic, NYHA functional class I. As far as I know, she has had a single episode of clinical heart failure related to poorly controlled hypertension in February 2013. At that time her cardiac catheterization showed LVEDP 28 mmHg. Diuretic therapy is currently not necessary for management of volume.. 4. HLP: Statin intolerance with multiple attempts. Tried red-yeast-rice without any benefit. If ultrasound shows progression of renal artery stenosis, may have to get more aggressive with treatment and consider getting financial support for 8 PCS K9 inhibitor  5. Tobacco  abuse: She is a very light cigarette smoker.Strongly encouraged her to quit smoking altogether.  Medication Adjustments/Labs and Tests Ordered: Current medicines are reviewed at length with the patient today.  Concerns regarding medicines are outlined above.  Medication changes, Labs and Tests ordered today are listed in the Patient Instructions below. Patient Instructions  Dr Sallyanne Kuster recommends that you schedule a follow-up appointment in 1 year. You will receive a reminder letter in the mail two months in advance. If you don't receive a letter, please call our office to schedule the follow-up appointment.  If you need a refill on your cardiac medications before your next appointment, please call your pharmacy.    Signed, Sanda Klein, MD  09/17/2016 8:48 AM    [facility] [place], [place], [place]  [address] Phone: [phone]; Fax: [phone]

## 2016-09-21 DIAGNOSIS — I10 Essential (primary) hypertension: Secondary | ICD-10-CM | POA: Diagnosis not present

## 2016-09-21 DIAGNOSIS — M47816 Spondylosis without myelopathy or radiculopathy, lumbar region: Secondary | ICD-10-CM | POA: Diagnosis not present

## 2016-09-21 DIAGNOSIS — M47818 Spondylosis without myelopathy or radiculopathy, sacral and sacrococcygeal region: Secondary | ICD-10-CM | POA: Diagnosis not present

## 2016-09-21 DIAGNOSIS — Z6825 Body mass index (BMI) 25.0-25.9, adult: Secondary | ICD-10-CM | POA: Diagnosis not present

## 2016-09-23 ENCOUNTER — Inpatient Hospital Stay (HOSPITAL_COMMUNITY): Admission: RE | Admit: 2016-09-23 | Payer: Medicare HMO | Source: Ambulatory Visit

## 2016-10-05 DIAGNOSIS — H43813 Vitreous degeneration, bilateral: Secondary | ICD-10-CM | POA: Diagnosis not present

## 2016-10-05 DIAGNOSIS — H02052 Trichiasis without entropian right lower eyelid: Secondary | ICD-10-CM | POA: Diagnosis not present

## 2016-10-05 DIAGNOSIS — H02051 Trichiasis without entropian right upper eyelid: Secondary | ICD-10-CM | POA: Diagnosis not present

## 2016-10-05 DIAGNOSIS — H524 Presbyopia: Secondary | ICD-10-CM | POA: Diagnosis not present

## 2016-10-14 ENCOUNTER — Ambulatory Visit (HOSPITAL_COMMUNITY)
Admission: RE | Admit: 2016-10-14 | Discharge: 2016-10-14 | Disposition: A | Discharge status: Home / Self Care | Payer: Medicare HMO | Source: Ambulatory Visit | Attending: Cardiology | Admitting: Cardiology

## 2016-10-14 DIAGNOSIS — I701 Atherosclerosis of renal artery: Secondary | ICD-10-CM

## 2016-10-14 DIAGNOSIS — R140 Abdominal distension (gaseous): Secondary | ICD-10-CM | POA: Insufficient documentation

## 2016-10-14 DIAGNOSIS — I700 Atherosclerosis of aorta: Secondary | ICD-10-CM | POA: Insufficient documentation

## 2016-10-14 DIAGNOSIS — I773 Arterial fibromuscular dysplasia: Secondary | ICD-10-CM | POA: Diagnosis not present

## 2016-10-19 DIAGNOSIS — E0590 Thyrotoxicosis, unspecified without thyrotoxic crisis or storm: Secondary | ICD-10-CM | POA: Diagnosis not present

## 2016-10-20 ENCOUNTER — Telehealth: Payer: Self-pay

## 2016-10-20 NOTE — Telephone Encounter (Signed)
-----   Message from Sanda Klein, MD sent at 10/15/2016  4:54 PM EDT ----- Renal artery stenosis actually appears less evere. I think we can stop doing yearly studies. Will look again if BP becomes difficult to control or renal function deteriorates.

## 2016-10-20 NOTE — Telephone Encounter (Signed)
Notes recorded by Diana Eves, CMA on 10/20/2016 at 1:39 PM EDT Daughter, [name], returned call. Okay per DPR. Results and recommendations reviewed.

## 2016-10-20 NOTE — Telephone Encounter (Signed)
lmtcb on daughter's voicemail (okay per DPR).

## 2016-10-28 DIAGNOSIS — E0590 Thyrotoxicosis, unspecified without thyrotoxic crisis or storm: Secondary | ICD-10-CM | POA: Diagnosis not present

## 2016-12-09 DIAGNOSIS — E0590 Thyrotoxicosis, unspecified without thyrotoxic crisis or storm: Secondary | ICD-10-CM | POA: Diagnosis not present

## 2016-12-16 DIAGNOSIS — M47818 Spondylosis without myelopathy or radiculopathy, sacral and sacrococcygeal region: Secondary | ICD-10-CM | POA: Diagnosis not present

## 2016-12-16 DIAGNOSIS — I10 Essential (primary) hypertension: Secondary | ICD-10-CM | POA: Diagnosis not present

## 2016-12-16 DIAGNOSIS — M47816 Spondylosis without myelopathy or radiculopathy, lumbar region: Secondary | ICD-10-CM | POA: Diagnosis not present

## 2016-12-16 DIAGNOSIS — Z6826 Body mass index (BMI) 26.0-26.9, adult: Secondary | ICD-10-CM | POA: Diagnosis not present

## 2017-01-07 ENCOUNTER — Telehealth: Payer: Self-pay | Admitting: Cardiovascular Disease

## 2017-01-07 MED ORDER — CANDESARTAN CILEXETIL 32 MG PO TABS: 32.0000 mg | ORAL_TABLET | Freq: Every day | ORAL | 5 refills | Status: DC

## 2017-01-07 NOTE — Telephone Encounter (Signed)
Patient w/HTN & HR  Will defer to pharmacy staff to assist with med change

## 2017-01-07 NOTE — Telephone Encounter (Signed)
LMOM for daughter [name] - will switch patient to candesartan 32 mg daily.  If they are not able to do home BP checks, they should call the office and we can arrange for patient to come in and get BP check in 2 weeks.  Rx sent to [facility] as this is where valsartan was last sent.

## 2017-01-07 NOTE — Telephone Encounter (Signed)
°  New Prob  Pt c/o medication issue:  1. Name of Medication: Valsartan  2. How are you currently taking this medication (dosage and times per day)? 320 mg once daily  3. Are you having a reaction (difficulty breathing--STAT)? No  4. What is your medication issue? Calling regarding recent recall

## 2017-02-08 ENCOUNTER — Ambulatory Visit (INDEPENDENT_AMBULATORY_CARE_PROVIDER_SITE_OTHER): Payer: Medicare HMO | Admitting: Family Medicine

## 2017-02-08 ENCOUNTER — Encounter: Payer: Self-pay | Admitting: Family Medicine

## 2017-02-08 VITALS — BP 124/64 | HR 87 | Temp 98.2°F | Ht 60.0 in | Wt 140.8 lb

## 2017-02-08 DIAGNOSIS — F419 Anxiety disorder, unspecified: Secondary | ICD-10-CM | POA: Diagnosis not present

## 2017-02-08 DIAGNOSIS — E0590 Thyrotoxicosis, unspecified without thyrotoxic crisis or storm: Secondary | ICD-10-CM | POA: Diagnosis not present

## 2017-02-08 DIAGNOSIS — E785 Hyperlipidemia, unspecified: Secondary | ICD-10-CM | POA: Diagnosis not present

## 2017-02-08 DIAGNOSIS — I2510 Atherosclerotic heart disease of native coronary artery without angina pectoris: Secondary | ICD-10-CM | POA: Diagnosis not present

## 2017-02-08 DIAGNOSIS — Z85820 Personal history of malignant melanoma of skin: Secondary | ICD-10-CM

## 2017-02-08 DIAGNOSIS — G8929 Other chronic pain: Secondary | ICD-10-CM

## 2017-02-08 DIAGNOSIS — M545 Low back pain: Secondary | ICD-10-CM | POA: Diagnosis not present

## 2017-02-08 DIAGNOSIS — Z720 Tobacco use: Secondary | ICD-10-CM | POA: Diagnosis not present

## 2017-02-08 DIAGNOSIS — I10 Essential (primary) hypertension: Secondary | ICD-10-CM

## 2017-02-08 DIAGNOSIS — R413 Other amnesia: Secondary | ICD-10-CM

## 2017-02-08 DIAGNOSIS — I5032 Chronic diastolic (congestive) heart failure: Secondary | ICD-10-CM

## 2017-02-08 NOTE — Patient Instructions (Addendum)
Let's try to decrease use of xanax and hydrocodone.  For xanax - cut in half and if doing well with lower dose, then try decreasing to once daily with second dose as needed For hydrocodone - also try to cut down - take 1/2 tablet instead of whole sometimes. Take ibuprofen 200mg  every morning with breakfast, take tylenol 500mg  every evening.  Return in 6-8 weeks for medicare wellness visit with Katha Cabal and physical with me.

## 2017-02-08 NOTE — Progress Notes (Signed)
BP 124/64   Pulse 87   Temp 98.2 F (36.8 C) (Oral)   Ht 5' (1.524 m)   Wt 140 lb 12 oz (63.8 kg)   SpO2 92%   BMI 27.49 kg/m    CC: new pt to establish, memory loss Subjective:    Patient ID: Meredith Gutierrez, female    DOB: May 27, 1941, 76 y.o.   MRN: [id number]  HPI: Meredith Gutierrez is a 76 y.o. female presenting on 02/08/2017 for Establish Care; Memory Loss (changes in mental status); and Back Pain (sees pain mgt)   Here with daughter who is also my patient, [place]. Prior saw Dr Tera Partridge in [place]. [name] speaks to me in private expressing several noted memory concerns - daughter has to administer medications otherwise pt may skip doses or take double the dose, noted forgetfulness, etc. Would like evaluation for dementia.   CAD s/p CABG 2003. Beta blockers limited by bradycardia.  HLD - intolerant of crestor, simvastatin, zetia and others.  ?RAS - latest renal duplex ultrasound was normal (2016).  Cardiology - Dr Sallyanne Kuster.   Hyperthyroidism - sees Dr Chalmers Cater, recenly received radioactive iodine therapy 09/2016. Planned f/u with endo next month.   Chronic back pain ongoing for last 5-6 yrs - sees pain management Dr Maryjean Ka. Known hand osteoarthritis as well as lower back. She takes hydrocodone 4 tablets a day scheduled. Also uses tramadol PRN. H/o severe lumbar arthritis. Steroid injections haven't helped. She has TENS unit.   Anxiety - on xanax 0.25mg  twice daily scheduled.   Some constipation - treats with miralax daily.   Melanoma ~2010 - does not see skin doctor regularly.   Preventative: Last CPE 03/2017.   Widow Lives with daughter and daughter's GF Edu: HS Occ: retired, worked at [employer] Activity: no regular exercise Diet: good water   Relevant past medical, surgical, family and social history reviewed and updated as indicated. Interim medical history since our last visit reviewed. Allergies and medications reviewed and updated. Outpatient Medications Prior  to Visit  Medication Sig Dispense Refill  . ALPRAZolam (NIRAVAM) 0.25 MG dissolvable tablet Take 0.25 mg by mouth 2 (two) times daily.     Marland Kitchen aspirin EC 81 MG tablet Take 81 mg by mouth daily.    . candesartan (ATACAND) 32 MG tablet Take 1 tablet (32 mg total) by mouth daily. 30 tablet 5  . diltiazem (CARDIZEM CD) 240 MG 24 hr capsule Take 1 capsule (240 mg total) by mouth daily. 90 capsule 3  . HYDROcodone-acetaminophen (NORCO) 10-325 MG tablet Take 1 tablet by mouth every 8 (eight) hours as needed for moderate pain.     . valsartan (DIOVAN) 320 MG tablet Take 1 tablet (320 mg total) by mouth daily. 90 tablet 3   No facility-administered medications prior to visit.      Per HPI unless specifically indicated in ROS section below Review of Systems     Objective:    BP 124/64   Pulse 87   Temp 98.2 F (36.8 C) (Oral)   Ht 5' (1.524 m)   Wt 140 lb 12 oz (63.8 kg)   SpO2 92%   BMI 27.49 kg/m   Wt Readings from Last 3 Encounters:  02/08/17 140 lb 12 oz (63.8 kg)  09/17/16 137 lb 6.4 oz (62.3 kg)  09/03/16 140 lb (63.5 kg)    Physical Exam  Constitutional: She is oriented to person, place, and time. She appears well-developed and well-nourished. No distress.  HENT:  Mouth/Throat: Oropharynx  is clear and moist. No oropharyngeal exudate.  Eyes: Pupils are equal, round, and reactive to light. Conjunctivae are normal. No scleral icterus.  Neck: Normal range of motion. Neck supple. No thyromegaly present.  Cardiovascular: Normal rate, regular rhythm, normal heart sounds and intact distal pulses.   No murmur heard. Pulmonary/Chest: Effort normal and breath sounds normal. No respiratory distress. She has no wheezes. She has no rales.  Musculoskeletal: She exhibits no edema.  Lymphadenopathy:    She has no cervical adenopathy.  Neurological: She is alert and oriented to person, place, and time.  Skin: Skin is warm and dry. No rash noted.  Psychiatric: She has a normal mood and affect.    Nursing note and vitals reviewed.  Results for orders placed or performed during the hospital encounter of 09/01/16  CBC  Result Value Ref Range   WBC 11.7 (H) 4.0 - 10.5 K/uL   RBC 4.29 3.87 - 5.11 MIL/uL   Hemoglobin 12.9 12.0 - 15.0 g/dL   HCT 39.7 36.0 - 46.0 %   MCV 92.5 78.0 - 100.0 fL   MCH 30.1 26.0 - 34.0 pg   MCHC 32.5 30.0 - 36.0 g/dL   RDW 13.3 11.5 - 15.5 %   Platelets 293 150 - 400 K/uL  Basic metabolic panel  Result Value Ref Range   Sodium 138 135 - 145 mmol/L   Potassium 5.7 (H) 3.5 - 5.1 mmol/L   Chloride 105 101 - 111 mmol/L   CO2 23 22 - 32 mmol/L   Glucose, Bld 176 (H) 65 - 99 mg/dL   BUN 12 6 - 20 mg/dL   Creatinine, Ser 0.64 0.44 - 1.00 mg/dL   Calcium 9.5 8.9 - 10.3 mg/dL   GFR calc non Af Amer >60 >60 mL/min   GFR calc Af Amer >60 >60 mL/min   Anion gap 10 5 - 15  Troponin I  Result Value Ref Range   Troponin I <0.03 <0.03 ng/mL  Potassium  Result Value Ref Range   Potassium 4.1 3.5 - 5.1 mmol/L      Assessment & Plan:   Problem List Items Addressed This Visit    Anxiety    Managed with xanax 0.25mg  BID scheduled. See above re plan.       CAD (coronary artery disease), CABG 08/24/2001, off pump, LIMA to LAD, SVG to 1st diagonal. Ligation of LAD artery aneurysm. (Chronic)    Followed by cardiology      Chronic diastolic heart failure (HCC)    Seems euvolemic      Chronic lower back pain    Followed by pain management Dr Lovenia Shuck, treated with hydrocodone 10/325mg  QID and tramadol PRN. See above re discussion on chronic narcotic therapy. Suggested scheduled tylenol 500mg  nightly, ibuprofen 200mg  QAM and decreasing hydrocodone use if able.       Relevant Medications   traMADol (ULTRAM) 50 MG tablet   acetaminophen (TYLENOL) 500 MG tablet   ibuprofen (ADVIL) 200 MG tablet   Dyslipidemia, statin intol. (Chronic)    Chronic, off medication. H/o statin intolerance. Will check FLP next fasting labs The ASCVD Risk score Mikey Bussing DC Jr., et al.,  2013) failed to calculate for the following reasons:   Cannot find a previous HDL lab   Cannot find a previous total cholesterol lab       Essential hypertension - Primary    Chronic, stable on diltiazem and candesartan       History of melanoma    Recommended yearly dermatology  evaluation.       Hyperthyroidism    Followed by endocrinology Dr Chalmers Cater s/p radioactive iodine therapy.      Memory impairment    Concerns expressed by daughter about short term memory impairment. She is on several medications that can cause worsening memory - discussed this. Recommended trying to taper down on benzodiazepine and narcotic to see if any improvement in memory.  Will also eval with mini-cog at upcoming medicare wellness visit.      Tobacco abuse    Light cigarette smoker. Encouraged ongoing smoking cessation.           Follow up plan: No Follow-up on file.  Ria Bush, MD

## 2017-02-09 ENCOUNTER — Telehealth: Payer: Self-pay | Admitting: Family Medicine

## 2017-02-09 ENCOUNTER — Encounter: Payer: Self-pay | Admitting: Family Medicine

## 2017-02-09 DIAGNOSIS — F0391 Unspecified dementia with behavioral disturbance: Secondary | ICD-10-CM | POA: Insufficient documentation

## 2017-02-09 DIAGNOSIS — M545 Low back pain, unspecified: Secondary | ICD-10-CM | POA: Insufficient documentation

## 2017-02-09 DIAGNOSIS — Z85820 Personal history of malignant melanoma of skin: Secondary | ICD-10-CM | POA: Insufficient documentation

## 2017-02-09 DIAGNOSIS — F0390 Unspecified dementia without behavioral disturbance: Secondary | ICD-10-CM | POA: Insufficient documentation

## 2017-02-09 DIAGNOSIS — G8929 Other chronic pain: Secondary | ICD-10-CM | POA: Insufficient documentation

## 2017-02-09 DIAGNOSIS — F419 Anxiety disorder, unspecified: Secondary | ICD-10-CM | POA: Insufficient documentation

## 2017-02-09 DIAGNOSIS — F03918 Unspecified dementia, unspecified severity, with other behavioral disturbance: Secondary | ICD-10-CM | POA: Insufficient documentation

## 2017-02-09 NOTE — Assessment & Plan Note (Signed)
Light cigarette smoker. Encouraged ongoing smoking cessation.

## 2017-02-09 NOTE — Assessment & Plan Note (Signed)
Followed by cardiology 

## 2017-02-09 NOTE — Assessment & Plan Note (Signed)
Managed with xanax 0.25mg  BID scheduled. See above re plan.

## 2017-02-09 NOTE — Assessment & Plan Note (Addendum)
Chronic, off medication. H/o statin intolerance. Will check FLP next fasting labs The ASCVD Risk score Mikey Bussing DC Jr., et al., 2013) failed to calculate for the following reasons:   Cannot find a previous HDL lab   Cannot find a previous total cholesterol lab

## 2017-02-09 NOTE — Assessment & Plan Note (Addendum)
Seems euvolemic.  

## 2017-02-09 NOTE — Assessment & Plan Note (Addendum)
Concerns expressed by daughter about short term memory impairment. She is on several medications that can cause worsening memory - discussed this. Recommended trying to taper down on benzodiazepine and narcotic to see if any improvement in memory.  Will also eval with mini-cog at upcoming medicare wellness visit.

## 2017-02-09 NOTE — Assessment & Plan Note (Signed)
Chronic, stable on diltiazem and candesartan

## 2017-02-09 NOTE — Assessment & Plan Note (Signed)
Recommended yearly dermatology evaluation.

## 2017-02-09 NOTE — Assessment & Plan Note (Signed)
Followed by pain management Dr Lovenia Shuck, treated with hydrocodone 10/325mg  QID and tramadol PRN. See above re discussion on chronic narcotic therapy. Suggested scheduled tylenol 500mg  nightly, ibuprofen 200mg  QAM and decreasing hydrocodone use if able.

## 2017-02-09 NOTE — Assessment & Plan Note (Signed)
Followed by endocrinology Dr Chalmers Cater s/p radioactive iodine therapy.

## 2017-02-09 NOTE — Telephone Encounter (Signed)
Per Dr. Darnell Level- Left pt daughter [name] message asking to call Ebony Hail back directly at [phone] to schedule AWV + labs with Katha Cabal.  CPE is scheduled for 04/13/17; Dr. Darnell Level would like AWV 1 week prior.   *NOTE* No hx of AWV in Epic

## 2017-02-14 ENCOUNTER — Encounter: Payer: Self-pay | Admitting: Family Medicine

## 2017-02-14 DIAGNOSIS — E559 Vitamin D deficiency, unspecified: Secondary | ICD-10-CM | POA: Insufficient documentation

## 2017-02-14 DIAGNOSIS — I773 Arterial fibromuscular dysplasia: Secondary | ICD-10-CM | POA: Insufficient documentation

## 2017-03-09 DIAGNOSIS — E0590 Thyrotoxicosis, unspecified without thyrotoxic crisis or storm: Secondary | ICD-10-CM | POA: Diagnosis not present

## 2017-03-14 DIAGNOSIS — I10 Essential (primary) hypertension: Secondary | ICD-10-CM | POA: Diagnosis not present

## 2017-03-14 DIAGNOSIS — M47816 Spondylosis without myelopathy or radiculopathy, lumbar region: Secondary | ICD-10-CM | POA: Diagnosis not present

## 2017-03-14 DIAGNOSIS — Z6826 Body mass index (BMI) 26.0-26.9, adult: Secondary | ICD-10-CM | POA: Diagnosis not present

## 2017-03-14 DIAGNOSIS — M47818 Spondylosis without myelopathy or radiculopathy, sacral and sacrococcygeal region: Secondary | ICD-10-CM | POA: Diagnosis not present

## 2017-03-17 NOTE — Telephone Encounter (Signed)
Scheduled 04/06/17

## 2017-04-06 ENCOUNTER — Other Ambulatory Visit: Payer: Self-pay | Admitting: Family Medicine

## 2017-04-06 ENCOUNTER — Ambulatory Visit (INDEPENDENT_AMBULATORY_CARE_PROVIDER_SITE_OTHER): Payer: Medicare HMO

## 2017-04-06 VITALS — BP 124/90 | HR 73 | Temp 98.3°F | Ht 60.0 in | Wt 141.2 lb

## 2017-04-06 DIAGNOSIS — E559 Vitamin D deficiency, unspecified: Secondary | ICD-10-CM

## 2017-04-06 DIAGNOSIS — E785 Hyperlipidemia, unspecified: Secondary | ICD-10-CM | POA: Diagnosis not present

## 2017-04-06 DIAGNOSIS — E0590 Thyrotoxicosis, unspecified without thyrotoxic crisis or storm: Secondary | ICD-10-CM

## 2017-04-06 DIAGNOSIS — Z23 Encounter for immunization: Secondary | ICD-10-CM | POA: Diagnosis not present

## 2017-04-06 DIAGNOSIS — Z0000 Encounter for general adult medical examination without abnormal findings: Secondary | ICD-10-CM | POA: Diagnosis not present

## 2017-04-06 LAB — BASIC METABOLIC PANEL
BUN: 13 mg/dL (ref 6–23)
CHLORIDE: 102 meq/L (ref 96–112)
CO2: 25 meq/L (ref 19–32)
Calcium: 10.2 mg/dL (ref 8.4–10.5)
Creatinine, Ser: 0.73 mg/dL (ref 0.40–1.20)
GFR: 81.9 mL/min (ref >60.00)
GLUCOSE: 127 mg/dL — AB (ref 70–99)
POTASSIUM: 5.8 meq/L — AB (ref 3.5–5.1)
Sodium: 137 mEq/L (ref 135–145)

## 2017-04-06 LAB — T4, FREE: FREE T4: 0.85 ng/dL (ref 0.60–1.60)

## 2017-04-06 LAB — LIPID PANEL
CHOL/HDL RATIO: 6
CHOLESTEROL: 216 mg/dL — AB (ref 0–200)
HDL: 35.9 mg/dL — ABNORMAL LOW (ref >39.00)
NonHDL: 179.76
Triglycerides: 278 mg/dL — ABNORMAL HIGH (ref 0.0–149.0)
VLDL: 55.6 mg/dL — ABNORMAL HIGH (ref 0.0–40.0)

## 2017-04-06 LAB — VITAMIN D 25 HYDROXY (VIT D DEFICIENCY, FRACTURES): VITD: 20.54 ng/mL — AB (ref 30.00–100.00)

## 2017-04-06 LAB — TSH: TSH: 0.71 u[IU]/mL (ref 0.35–4.50)

## 2017-04-06 LAB — LDL CHOLESTEROL, DIRECT: Direct LDL: 137 mg/dL

## 2017-04-06 NOTE — Progress Notes (Signed)
Subjective:   Meredith Gutierrez is a 76 y.o. female who presents for Medicare Annual (Subsequent) preventive examination.  Review of Systems:  N/A Cardiac Risk Factors include: advanced age (>39men, >56 women);smoking/ tobacco exposure;dyslipidemia;hypertension     Objective:     Vitals: BP 124/90 (BP Location: Right Arm, Patient Position: Sitting, Cuff Size: Normal)   Pulse 73   Temp 98.3 F (36.8 C) (Oral)   Ht 5' (1.524 m) Comment: no shoes  Wt 141 lb 4 oz (64.1 kg)   SpO2 96%   BMI 27.59 kg/m   Body mass index is 27.59 kg/m.   Tobacco History  Smoking Status  . Light Tobacco Smoker  . Packs/day: 0.10  . Years: 20.00  . Types: Cigarettes  Smokeless Tobacco  . Never Used    Comment: smoking cessation info given     Ready to quit: No Counseling given: No   Past Medical History:  Diagnosis Date  . Angina    never needed to take nitroglycerin.  . Anxiety   . Blood transfusion without reported diagnosis   . Cerebral atherosclerosis   . COPD (chronic obstructive pulmonary disease) ([facility])    02-16-13-"pt. denies this"-shows on CXR of 2- 2013.  Marland Kitchen Coronary artery disease 2003   s/p CABG 2014  . Diastolic CHF, on admit treated. 08/10/2011  . Fibromuscular dysplasia of renal artery (HCC)    right  . Heart murmur   . History of kidney stones 02-16-13   "hx. of overgrowth of muscle-causing some stricture"renal artery stenosis-being followed [facility]  . Hyperlipidemia   . Hypertension   . Hyperthyroidism    [facility]  . Melanoma ([facility]) 2010   skin cancers, 1 melanoma removed back  . Osteoarthritis    Past Surgical History:  Procedure Laterality Date  . ABDOMINAL HYSTERECTOMY  1970   heavy bleeding  . APPENDECTOMY  1960s  . CARDIAC CATHETERIZATION  08/09/2011   no sign. coronary obstruction. LIMA is atretic w/excellent flow down the native LAD  . CARDIAC CATHETERIZATION  08/22/2001   no sign. coronary stenosis,  . CATARACT EXTRACTION, BILATERAL Bilateral   . CESAREAN  SECTION    . CORONARY ARTERY BYPASS GRAFT  02-16-13   3'03-no bypasses -Had 2 aneurysm repairs(stents used)  . LAPAROTOMY N/A 02/20/2013   Procedure: EXPLORATORY LAPAROTOMY;  [name], MD  . LEFT HEART CATHETERIZATION WITH CORONARY ANGIOGRAM N/A 08/09/2011   Procedure: LEFT HEART CATHETERIZATION WITH CORONARY ANGIOGRAM;  Surgeon: Sanda Klein, MD;  Location: [facility]ATH [facility];  Service: Cardiovascular;  Laterality: N/A;  . NM MYOCAR PERF WALL MOTION  07/20/2010   normal  . SALPINGOOPHORECTOMY Bilateral 02/20/2013   benign seromucinous cystadenofibroma R ovary [name], MD)  . TOOTH EXTRACTION    . TOTAL HIP ARTHROPLASTY Left 2015   Family History  Problem Relation Age of Onset  . Stroke Mother   . Hypotension Mother   . Coronary artery disease Father   . Hypertension Father   . Breast cancer Maternal Aunt   . Hypotension Sister   . Hypertension Brother   . Diabetes Brother   . Lung cancer Daughter   . Thyroid cancer Daughter    History  Sexual Activity  . Sexual activity: No    Outpatient Encounter Prescriptions as of 04/06/2017  Medication Sig  . acetaminophen (TYLENOL) 500 MG tablet Take 2,300 mg by mouth daily.   Marland Kitchen aspirin EC 81 MG tablet Take 81 mg by mouth daily.  . candesartan (ATACAND) 32 MG tablet Take 1  tablet (32 mg total) by mouth daily.  Marland Kitchen diltiazem (CARDIZEM CD) 240 MG 24 hr capsule Take 1 capsule (240 mg total) by mouth daily.  . MORPHINE SULFATE PO Take 1 tablet by mouth every 6 (six) hours.  . traMADol (ULTRAM) 50 MG tablet Take 1-2 tabs every 8 hours  . [DISCONTINUED] ALPRAZolam (NIRAVAM) 0.25 MG dissolvable tablet Take 0.25 mg by mouth 2 (two) times daily.   . [DISCONTINUED] HYDROcodone-acetaminophen (NORCO) 10-325 MG tablet Take 1 tablet by mouth every 8 (eight) hours as needed for moderate pain.   . [DISCONTINUED] ibuprofen (ADVIL) 200 MG tablet Take 1 tablet (200 mg total) by mouth daily.   No facility-administered encounter medications on file as of  04/06/2017.     Activities of Daily Living In your present state of health, do you have any difficulty performing the following activities: 04/06/2017  Hearing? N  Vision? N  Difficulty concentrating or making decisions? Y  Walking or climbing stairs? N  Dressing or bathing? N  Doing errands, shopping? N  Preparing Food and eating ? N  Using the Toilet? N  In the past six months, have you accidently leaked urine? N  Do you have problems with loss of bowel control? N  Managing your Medications? N  Managing your Finances? N  Housekeeping or managing your Housekeeping? N  Some recent data might be hidden    Patient Care Team: Ria Bush, MD as PCP - General (Family Medicine)    Assessment:     Hearing Screening   125Hz  250Hz  500Hz  1000Hz  2000Hz  3000Hz  4000Hz  6000Hz  8000Hz   Right ear:   0 0 40  0    Left ear:   0 0 0  0      Visual Acuity Screening   Right eye Left eye Both eyes  Without correction: 20/25 20/25 20/25-1  With correction:       Exercise Activities and Dietary recommendations Current Exercise Habits: Home exercise routine, Type of exercise: walking, Time (Minutes): 30, Frequency (Times/Week): 7, Weekly Exercise (Minutes/Week): 210, Intensity: Mild, Exercise limited by: None identified  Goals    . Increase physical activity          Starting 04/06/2017, I will continue to walk at least 30 min daily.       Fall Risk Fall Risk  04/06/2017  Falls in the past year? No   Depression Screen PHQ 2/9 Scores 04/06/2017  PHQ - 2 Score 0  PHQ- 9 Score 0     Cognitive Function MMSE - Mini Mental State Exam 04/06/2017  Orientation to time 5  Orientation to Place 5  Registration 3  Attention/ Calculation 0  Recall 0  Recall-comments unable to recall 3 of 3 words  Language- name 2 objects 0  Language- repeat 1  Language- follow 3 step command 1  Language- follow 3 step command-comments unable to follow 2 steps of 3 step command  Language- read &  follow direction 0  Write a sentence 0  Copy design 0  Total score 15       PLEASE NOTE: A Mini-Cog screen was completed. Maximum score is 20. A value of 0 denotes this part of [facility] MMSE was not completed or the patient failed this part of the Mini-Cog screening.   Mini-Cog Screening Orientation to Time - Max 5 pts Orientation to Place - Max 5 pts Registration - Max 3 pts Recall - Max 3 pts Language Repeat - Max 1 pts Language Follow 3 Step Command -  Max 3 pts   Immunization History  Administered Date(s) Administered  . Influenza,inj,Quad PF,6+ Mos 04/06/2017  . Pneumococcal Conjugate-13 12/02/2014  . Pneumococcal Polysaccharide-23 06/27/2008  . Zoster 04/16/2014   Screening Tests Health Maintenance  Topic Date Due  . DTaP/Tdap/Td (1 - Tdap) 04/07/2027 (Originally 01/03/1960)  . TETANUS/TDAP  04/07/2027 (Originally 01/03/1960)  . INFLUENZA VACCINE  Completed  . DEXA SCAN  Completed  . PNA vac Low Risk Adult  Completed      Plan:     I have personally reviewed, addressed, and noted the following in the patient's chart:  A. Medical and social history B. Use of alcohol, tobacco or illicit drugs  C. Current medications and supplements D. Functional ability and status E.  Nutritional status F.  Physical activity G. Advance directives H. List of other physicians I.  Hospitalizations, surgeries, and ER visits in previous 12 months J.  Eleele to include hearing, vision, cognitive, depression L. Referrals and appointments - none  In addition, I have reviewed and discussed with patient certain preventive protocols, quality metrics, and best practice recommendations. A written personalized care plan for preventive services as well as general preventive health recommendations were provided to patient.  See attached scanned questionnaire for additional information.   Signed,   Lindell Noe, MHA, BS, LPN Health Coach

## 2017-04-06 NOTE — Progress Notes (Signed)
PCP notes:   Health maintenance:  Tetanus vaccine - postponed/insurance Flu vaccine - administered Bone density - per daughter, exam was in 2017  Abnormal screenings:   Mini-Cog score: 15/20 Hearing - failed  Hearing Screening   125Hz  250Hz  500Hz  1000Hz  2000Hz  3000Hz  4000Hz  6000Hz  8000Hz   Right ear:   0 0 40  0    Left ear:   0 0 0  0     Patient concerns:   Chronic lower back and bilateral hip pain. Taking Morphine, Tylenol, and Tramadol to help manage pain. Unable to afford prescribed pain patches.   Nurse concerns:  None  Next PCP appt:   04/13/17 @ 1130

## 2017-04-06 NOTE — Patient Instructions (Signed)
Meredith Gutierrez , Thank you for taking time to come for your Medicare Wellness Visit. I appreciate your ongoing commitment to your health goals. Please review the following plan we discussed and let me know if I can assist you in the future.   These are the goals we discussed: Goals    . Increase physical activity          Starting 04/06/2017, I will continue to walk at least 30 min daily.        This is a list of the screening recommended for you and due dates:  Health Maintenance  Topic Date Due  . DTaP/Tdap/Td vaccine (1 - Tdap) 04/07/2027*  . Tetanus Vaccine  04/07/2027*  . Flu Shot  Completed  . DEXA scan (bone density measurement)  Completed  . Pneumonia vaccines  Completed  *Topic was postponed. The date shown is not the original due date.   Preventive Care for Adults  A healthy lifestyle and preventive care can promote health and wellness. Preventive health guidelines for adults include the following key practices.  . A routine yearly physical is a good way to check with your health care provider about your health and preventive screening. It is a chance to share any concerns and updates on your health and to receive a thorough exam.  . Visit your dentist for a routine exam and preventive care every 6 months. Brush your teeth twice a day and floss once a day. Good oral hygiene prevents tooth decay and gum disease.  . The frequency of eye exams is based on your age, health, family medical history, use  of contact lenses, and other factors. Follow your health care provider's recommendations for frequency of eye exams.  . Eat a healthy diet. Foods like vegetables, fruits, whole grains, low-fat dairy products, and lean protein foods contain the nutrients you need without too many calories. Decrease your intake of foods high in solid fats, added sugars, and salt. Eat the right amount of calories for you. Get information about a proper diet from your health care provider, if  necessary.  . Regular physical exercise is one of the most important things you can do for your health. Most adults should get at least 150 minutes of moderate-intensity exercise (any activity that increases your heart rate and causes you to sweat) each week. In addition, most adults need muscle-strengthening exercises on 2 or more days a week.  Silver Sneakers may be a benefit available to you. To determine eligibility, you may visit the website: www.silversneakers.com or contact program at (239)865-7254 Mon-Fri between 8AM-8PM.   . Maintain a healthy weight. The body mass index (BMI) is a screening tool to identify possible weight problems. It provides an estimate of body fat based on height and weight. Your health care provider can find your BMI and can help you achieve or maintain a healthy weight.   For adults 20 years and older: ? A BMI below 18.5 is considered underweight. ? A BMI of 18.5 to 24.9 is normal. ? A BMI of 25 to 29.9 is considered overweight. ? A BMI of 30 and above is considered obese.   . Maintain normal blood lipids and cholesterol levels by exercising and minimizing your intake of saturated fat. Eat a balanced diet with plenty of fruit and vegetables. Blood tests for lipids and cholesterol should begin at age 33 and be repeated every 5 years. If your lipid or cholesterol levels are high, you are over 50, or you are at  high risk for heart disease, you may need your cholesterol levels checked more frequently. Ongoing high lipid and cholesterol levels should be treated with medicines if diet and exercise are not working.  . If you smoke, find out from your health care provider how to quit. If you do not use tobacco, please do not start.  . If you choose to drink alcohol, please do not consume more than 2 drinks per day. One drink is considered to be 12 ounces (355 mL) of beer, 5 ounces (148 mL) of wine, or 1.5 ounces (44 mL) of liquor.  . If you are 37-25 years old, ask your  health care provider if you should take aspirin to prevent strokes.  . Use sunscreen. Apply sunscreen liberally and repeatedly throughout the day. You should seek shade when your shadow is shorter than you. Protect yourself by wearing long sleeves, pants, a wide-brimmed hat, and sunglasses year round, whenever you are outdoors.  . Once a month, do a whole body skin exam, using a mirror to look at the skin on your back. Tell your health care provider of new moles, moles that have irregular borders, moles that are larger than a pencil eraser, or moles that have changed in shape or color.

## 2017-04-06 NOTE — Progress Notes (Signed)
Pre visit review using our clinic review tool, if applicable. No additional management support is needed unless otherwise documented below in the visit note. 

## 2017-04-07 LAB — T3: T3, Total: 105 ng/dL (ref 76–181)

## 2017-04-09 ENCOUNTER — Encounter: Payer: Self-pay | Admitting: Family Medicine

## 2017-04-09 DIAGNOSIS — Z7189 Other specified counseling: Secondary | ICD-10-CM | POA: Insufficient documentation

## 2017-04-11 NOTE — Progress Notes (Signed)
I reviewed health advisor's note, was available for consultation, and agree with documentation and plan.  

## 2017-04-13 ENCOUNTER — Telehealth: Payer: Self-pay | Admitting: Family Medicine

## 2017-04-13 ENCOUNTER — Ambulatory Visit (INDEPENDENT_AMBULATORY_CARE_PROVIDER_SITE_OTHER): Payer: Medicare HMO | Admitting: Family Medicine

## 2017-04-13 ENCOUNTER — Encounter: Payer: Self-pay | Admitting: Family Medicine

## 2017-04-13 VITALS — BP 126/84 | HR 83 | Temp 98.0°F | Ht 60.0 in | Wt 140.0 lb

## 2017-04-13 DIAGNOSIS — E0590 Thyrotoxicosis, unspecified without thyrotoxic crisis or storm: Secondary | ICD-10-CM | POA: Diagnosis not present

## 2017-04-13 DIAGNOSIS — E785 Hyperlipidemia, unspecified: Secondary | ICD-10-CM | POA: Diagnosis not present

## 2017-04-13 DIAGNOSIS — G8929 Other chronic pain: Secondary | ICD-10-CM | POA: Diagnosis not present

## 2017-04-13 DIAGNOSIS — Z0000 Encounter for general adult medical examination without abnormal findings: Secondary | ICD-10-CM | POA: Insufficient documentation

## 2017-04-13 DIAGNOSIS — Z7189 Other specified counseling: Secondary | ICD-10-CM | POA: Diagnosis not present

## 2017-04-13 DIAGNOSIS — M545 Low back pain: Secondary | ICD-10-CM | POA: Diagnosis not present

## 2017-04-13 DIAGNOSIS — E875 Hyperkalemia: Secondary | ICD-10-CM

## 2017-04-13 DIAGNOSIS — I10 Essential (primary) hypertension: Secondary | ICD-10-CM

## 2017-04-13 DIAGNOSIS — H9193 Unspecified hearing loss, bilateral: Secondary | ICD-10-CM

## 2017-04-13 DIAGNOSIS — F419 Anxiety disorder, unspecified: Secondary | ICD-10-CM

## 2017-04-13 DIAGNOSIS — Z720 Tobacco use: Secondary | ICD-10-CM | POA: Diagnosis not present

## 2017-04-13 DIAGNOSIS — G3184 Mild cognitive impairment, so stated: Secondary | ICD-10-CM

## 2017-04-13 DIAGNOSIS — E559 Vitamin D deficiency, unspecified: Secondary | ICD-10-CM

## 2017-04-13 DIAGNOSIS — Z0001 Encounter for general adult medical examination with abnormal findings: Secondary | ICD-10-CM

## 2017-04-13 MED ORDER — VITAMIN D 50 MCG (2000 UT) PO CAPS: 1.0000 | ORAL_CAPSULE | Freq: Every day | ORAL | Status: DC

## 2017-04-13 NOTE — Assessment & Plan Note (Addendum)
Now off xanax and doing well. Will need to review this next visit.

## 2017-04-13 NOTE — Addendum Note (Signed)
Addended by: Ria Bush on: 04/13/2017 10:05 PM   Modules accepted: Orders

## 2017-04-13 NOTE — Assessment & Plan Note (Signed)
rec start vit D 2000 IU daily.  

## 2017-04-13 NOTE — Assessment & Plan Note (Signed)
Chronic, stable. Continue current regimen. See phone note regarding hyperkalemia.

## 2017-04-13 NOTE — Assessment & Plan Note (Signed)
Pt agrees to audiology evaluation.

## 2017-04-13 NOTE — Assessment & Plan Note (Signed)
Preventative protocols reviewed and updated unless pt declined. Discussed healthy diet and lifestyle.  

## 2017-04-13 NOTE — Assessment & Plan Note (Signed)
TFTs stable off meds.  

## 2017-04-13 NOTE — Patient Instructions (Addendum)
We will refer you to audiologist for formal hearing test.  We will sign you up for cologuard for colon cancer screening.  Call [facility] to schedule mammogram.  Sign records for bone density scan from Dr Willeen Cass office.  If interested, check with pharmacy about new 2 shot shingles series (shingrix).  Memory testing revealed some concerns for ongoing memory loss - consider memory medicine called aricept.  Return in 3 months for follow up visit.  Health Maintenance, Female Adopting a healthy lifestyle and getting preventive care can go a long way to promote health and wellness. Talk with your health care provider about what schedule of regular examinations is right for you. This is a good chance for you to check in with your provider about disease prevention and staying healthy. In between checkups, there are plenty of things you can do on your own. Experts have done a lot of research about which lifestyle changes and preventive measures are most likely to keep you healthy. Ask your health care provider for more information. Weight and diet Eat a healthy diet  Be sure to include plenty of vegetables, fruits, low-fat dairy products, and lean protein.  Do not eat a lot of foods high in solid fats, added sugars, or salt.  Get regular exercise. This is one of the most important things you can do for your health. ? Most adults should exercise for at least 150 minutes each week. The exercise should increase your heart rate and make you sweat (moderate-intensity exercise). ? Most adults should also do strengthening exercises at least twice a week. This is in addition to the moderate-intensity exercise.  Maintain a healthy weight  Body mass index (BMI) is a measurement that can be used to identify possible weight problems. It estimates body fat based on height and weight. Your health care provider can help determine your BMI and help you achieve or maintain a healthy weight.  For females 1  years of age and older: ? A BMI below 18.5 is considered underweight. ? A BMI of 18.5 to 24.9 is normal. ? A BMI of 25 to 29.9 is considered overweight. ? A BMI of 30 and above is considered obese.  Watch levels of cholesterol and blood lipids  You should start having your blood tested for lipids and cholesterol at 78 years of age, then have this test every 5 years.  You may need to have your cholesterol levels checked more often if: ? Your lipid or cholesterol levels are high. ? You are older than 78 years of age. ? You are at high risk for heart disease.  Cancer screening Lung Cancer  Lung cancer screening is recommended for adults 60-7 years old who are at high risk for lung cancer because of a history of smoking.  A yearly low-dose CT scan of the lungs is recommended for people who: ? Currently smoke. ? Have quit within the past 15 years. ? Have at least a 30-pack-year history of smoking. A pack year is smoking an average of one pack of cigarettes a day for 1 year.  Yearly screening should continue until it has been 15 years since you quit.  Yearly screening should stop if you develop a health problem that would prevent you from having lung cancer treatment.  Breast Cancer  Practice breast self-awareness. This means understanding how your breasts normally appear and feel.  It also means doing regular breast self-exams. Let your health care provider know about any changes, no matter how small.  If you are in your 20s or 30s, you should have a clinical breast exam (CBE) by a health care provider every 1-3 years as part of a regular health exam.  If you are 15 or older, have a CBE every year. Also consider having a breast X-ray (mammogram) every year.  If you have a family history of breast cancer, talk to your health care provider about genetic screening.  If you are at high risk for breast cancer, talk to your health care provider about having an MRI and a mammogram every  year.  Breast cancer gene (BRCA) assessment is recommended for women who have family members with BRCA-related cancers. BRCA-related cancers include: ? Breast. ? Ovarian. ? Tubal. ? Peritoneal cancers.  Results of the assessment will determine the need for genetic counseling and BRCA1 and BRCA2 testing.  Cervical Cancer Your health care provider may recommend that you be screened regularly for cancer of the pelvic organs (ovaries, uterus, and vagina). This screening involves a pelvic examination, including checking for microscopic changes to the surface of your cervix (Pap test). You may be encouraged to have this screening done every 3 years, beginning at age 61.  For women ages 7-65, health care providers may recommend pelvic exams and Pap testing every 3 years, or they may recommend the Pap and pelvic exam, combined with testing for human papilloma virus (HPV), every 5 years. Some types of HPV increase your risk of cervical cancer. Testing for HPV may also be done on women of any age with unclear Pap test results.  Other health care providers may not recommend any screening for nonpregnant women who are considered low risk for pelvic cancer and who do not have symptoms. Ask your health care provider if a screening pelvic exam is right for you.  If you have had past treatment for cervical cancer or a condition that could lead to cancer, you need Pap tests and screening for cancer for at least 20 years after your treatment. If Pap tests have been discontinued, your risk factors (such as having a new sexual partner) need to be reassessed to determine if screening should resume. Some women have medical problems that increase the chance of getting cervical cancer. In these cases, your health care provider may recommend more frequent screening and Pap tests.  Colorectal Cancer  This type of cancer can be detected and often prevented.  Routine colorectal cancer screening usually begins at 78  years of age and continues through 78 years of age.  Your health care provider may recommend screening at an earlier age if you have risk factors for colon cancer.  Your health care provider may also recommend using home test kits to check for hidden blood in the stool.  A small camera at the end of a tube can be used to examine your colon directly (sigmoidoscopy or colonoscopy). This is done to check for the earliest forms of colorectal cancer.  Routine screening usually begins at age 29.  Direct examination of the colon should be repeated every 5-10 years through 78 years of age. However, you may need to be screened more often if early forms of precancerous polyps or small growths are found.  Skin Cancer  Check your skin from head to toe regularly.  Tell your health care provider about any new moles or changes in moles, especially if there is a change in a mole's shape or color.  Also tell your health care provider if you have a mole that is  larger than the size of a pencil eraser.  Always use sunscreen. Apply sunscreen liberally and repeatedly throughout the day.  Protect yourself by wearing long sleeves, pants, a wide-brimmed hat, and sunglasses whenever you are outside.  Heart disease, diabetes, and high blood pressure  High blood pressure causes heart disease and increases the risk of stroke. High blood pressure is more likely to develop in: ? People who have blood pressure in the high end of the normal range (130-139/85-89 mm Hg). ? People who are overweight or obese. ? People who are African American.  If you are 40-37 years of age, have your blood pressure checked every 3-5 years. If you are 83 years of age or older, have your blood pressure checked every year. You should have your blood pressure measured twice-once when you are at a hospital or clinic, and once when you are not at a hospital or clinic. Record the average of the two measurements. To check your blood pressure  when you are not at a hospital or clinic, you can use: ? An automated blood pressure machine at a pharmacy. ? A home blood pressure monitor.  If you are between 62 years and 70 years old, ask your health care provider if you should take aspirin to prevent strokes.  Have regular diabetes screenings. This involves taking a blood sample to check your fasting blood sugar level. ? If you are at a normal weight and have a low risk for diabetes, have this test once every three years after 78 years of age. ? If you are overweight and have a high risk for diabetes, consider being tested at a younger age or more often. Preventing infection Hepatitis B  If you have a higher risk for hepatitis B, you should be screened for this virus. You are considered at high risk for hepatitis B if: ? You were born in a country where hepatitis B is common. Ask your health care provider which countries are considered high risk. ? Your parents were born in a high-risk country, and you have not been immunized against hepatitis B (hepatitis B vaccine). ? You have HIV or AIDS. ? You use needles to inject street drugs. ? You live with someone who has hepatitis B. ? You have had sex with someone who has hepatitis B. ? You get hemodialysis treatment. ? You take certain medicines for conditions, including cancer, organ transplantation, and autoimmune conditions.  Hepatitis C  Blood testing is recommended for: ? Everyone born from [birth year] through 1967. ? Anyone with known risk factors for hepatitis C.  Sexually transmitted infections (STIs)  You should be screened for sexually transmitted infections (STIs) including gonorrhea and chlamydia if: ? You are sexually active and are younger than 78 years of age. ? You are older than 78 years of age and your health care provider tells you that you are at risk for this type of infection. ? Your sexual activity has changed since you were last screened and you are at an increased  risk for chlamydia or gonorrhea. Ask your health care provider if you are at risk.  If you do not have HIV, but are at risk, it may be recommended that you take a prescription medicine daily to prevent HIV infection. This is called pre-exposure prophylaxis (PrEP). You are considered at risk if: ? You are sexually active and do not regularly use condoms or know the HIV status of your partner(s). ? You take drugs by injection. ? You are sexually active  with a partner who has HIV.  Talk with your health care provider about whether you are at high risk of being infected with HIV. If you choose to begin PrEP, you should first be tested for HIV. You should then be tested every 3 months for as long as you are taking PrEP. Pregnancy  If you are premenopausal and you may become pregnant, ask your health care provider about preconception counseling.  If you may become pregnant, take 400 to 800 micrograms (mcg) of folic acid every day.  If you want to prevent pregnancy, talk to your health care provider about birth control (contraception). Osteoporosis and menopause  Osteoporosis is a disease in which the bones lose minerals and strength with aging. This can result in serious bone fractures. Your risk for osteoporosis can be identified using a bone density scan.  If you are 30 years of age or older, or if you are at risk for osteoporosis and fractures, ask your health care provider if you should be screened.  Ask your health care provider whether you should take a calcium or vitamin D supplement to lower your risk for osteoporosis.  Menopause may have certain physical symptoms and risks.  Hormone replacement therapy may reduce some of these symptoms and risks. Talk to your health care provider about whether hormone replacement therapy is right for you. Follow these instructions at home:  Schedule regular health, dental, and eye exams.  Stay current with your immunizations.  Do not use any  tobacco products including cigarettes, chewing tobacco, or electronic cigarettes.  If you are pregnant, do not drink alcohol.  If you are breastfeeding, limit how much and how often you drink alcohol.  Limit alcohol intake to no more than 1 drink per day for nonpregnant women. One drink equals 12 ounces of beer, 5 ounces of wine, or 1 ounces of hard liquor.  Do not use street drugs.  Do not share needles.  Ask your health care provider for help if you need support or information about quitting drugs.  Tell your health care provider if you often feel depressed.  Tell your health care provider if you have ever been abused or do not feel safe at home. This information is not intended to replace advice given to you by your health care provider. Make sure you discuss any questions you have with your health care provider. Document Released: 12/14/2010 Document Revised: 11/06/2015 Document Reviewed: 03/04/2015 Elsevier Interactive Patient Education  [employer].

## 2017-04-13 NOTE — Assessment & Plan Note (Signed)
Appreciate pain management by Dr Maryjean Ka. She is doing better with morphine - daughter notes improvement in pain control and mobility.

## 2017-04-13 NOTE — Assessment & Plan Note (Addendum)
minicog at [facility] 15/20. MMSE today 19/30, 21 with cues. Discussed with pt and daughter. Daughter also concerned about possible memory deficits. Pt does not think she had any trouble with this. Encouraged ongoing mental stimulation, social interactions and physical activity as able. Discussed aricept option, pt declines at this time. I did ask them to return in 3 months for f/u visit and reassessment.

## 2017-04-13 NOTE — Assessment & Plan Note (Addendum)
Advanced directive: living will/HCPOA scanned and in chart 03/2017. HCPOA are daughter [name] and son Charlotte Crumb. Does not want prolonged life support if terminal condition.

## 2017-04-13 NOTE — Assessment & Plan Note (Signed)
Chronic, off medication. H/o statin intolerance, h/o weakness to zetia. Will need to discuss chol medication options.  The 10-year ASCVD risk score Mikey Bussing DC Brooke Bonito., et al., 2013) is: 34.1%   Values used to calculate the score:     Age: 76 years     Sex: Female     Is Non-Hispanic African American: No     Diabetic: No     Tobacco smoker: Yes     Systolic Blood Pressure: 855 mmHg     Is BP treated: Yes     HDL Cholesterol: 35.9 mg/dL     Total Cholesterol: 216 mg/dL

## 2017-04-13 NOTE — Progress Notes (Addendum)
BP 126/84 (BP Location: Left Arm, Patient Position: Sitting, Cuff Size: Normal)   Pulse 83   Temp 98 F (36.7 C) (Oral)   Ht 5' (1.524 m)   Wt 140 lb (63.5 kg)   SpO2 94%   BMI 27.34 kg/m    CC: CPE Subjective:    Patient ID: [name], female    DOB: April 06, 1941, 76 y.o.   MRN: [id number]  HPI: CARALINA NOP is a 76 y.o. female presenting on 04/13/2017 for Annual Exam (Pt 2)   Saw Katha Cabal last week for medicare wellness visit. Note reviewed.  Failed hearing screen. Daughter had concern over dementia/memory loss. minicog 15/20 (recall 0/3), failed clock drawing test.   Established 2 months ago. Ongoing back pain despite tramadol, tylenol, hydrocodone --> recently changed to morphine and she is tolerating morphine better - improved mobility. Managed by Dr Maryjean Ka.   Geriatric Assessment: Mental Status Exam: 19/30, 21/30 with cues (value/max value) Clock Drawing Score: see scanned result in chart from last week  Preventative: Colon cancer screening - discussed, desires cologuard.  Lung cancer screening - not eligible. 1 pack/wk x about 30 yrs.  Breast cancer screening - 03/2015 WNL. Due for rpt. Will call [facility] to schedule mammogram. Does breast exams at home.  Well woman exam - s/p hysterectomy, ovaries removed age 67, s/p HRT.  DEXA scan - 2017, normal per daughter done by Dr Chalmers Cater, will request records. Flu shot yearly  pnumovax 2010, prevnar 2016 zostavax 2015 shingrix - discussed Advanced directive: living will/HCPOA scanned and in chart 03/2017. HCPOA are daughter [name] and son Charlotte Crumb. Does not want prolonged life support if terminal condition.  Seat belt use discussed Sunscreen use discussed, no changing moles on skin. H/o melanoma. rec yearly derm eval. Smoker - 1 pack per week Alcohol - none  Widow Lives with daughter and daughter's GF Edu: HS Occ: retired, worked at [employer] Activity: no regular exercise Diet: good water   Relevant past medical,  surgical, family and social history reviewed and updated as indicated. Interim medical history since our last visit reviewed. Allergies and medications reviewed and updated. Outpatient Medications Prior to Visit  Medication Sig Dispense Refill  . acetaminophen (TYLENOL) 500 MG tablet Take 2,300 mg by mouth daily.     Marland Kitchen aspirin EC 81 MG tablet Take 81 mg by mouth daily.    . candesartan (ATACAND) 32 MG tablet Take 1 tablet (32 mg total) by mouth daily. 30 tablet 5  . diltiazem (CARDIZEM CD) 240 MG 24 hr capsule Take 1 capsule (240 mg total) by mouth daily. 90 capsule 3  . traMADol (ULTRAM) 50 MG tablet Take 1-2 tabs every 8 hours    . MORPHINE SULFATE PO Take 1 tablet by mouth every 6 (six) hours.     No facility-administered medications prior to visit.      Per HPI unless specifically indicated in ROS section below Review of Systems  Constitutional: Negative for activity change, appetite change, chills, fatigue, fever and unexpected weight change.  HENT: Negative for hearing loss.   Eyes: Negative for visual disturbance.  Respiratory: Negative for cough, chest tightness, shortness of breath and wheezing.   Cardiovascular: Positive for leg swelling (R>L). Negative for chest pain and palpitations.  Gastrointestinal: Negative for abdominal distention, abdominal pain, blood in stool, constipation, diarrhea, nausea and vomiting.  Genitourinary: Negative for difficulty urinating and hematuria.  Musculoskeletal: Negative for arthralgias, myalgias and neck pain.  Skin: Negative for rash.  Neurological: Negative for dizziness,  seizures, syncope and headaches.  Hematological: Negative for adenopathy. Does not bruise/bleed easily.  Psychiatric/Behavioral: Negative for dysphoric mood. The patient is not nervous/anxious.        Objective:    BP 126/84 (BP Location: Left Arm, Patient Position: Sitting, Cuff Size: Normal)   Pulse 83   Temp 98 F (36.7 C) (Oral)   Ht 5' (1.524 m)   Wt 140 lb  (63.5 kg)   SpO2 94%   BMI 27.34 kg/m   Wt Readings from Last 3 Encounters:  04/13/17 140 lb (63.5 kg)  04/06/17 141 lb 4 oz (64.1 kg)  02/08/17 140 lb 12 oz (63.8 kg)    Physical Exam  Constitutional: She is oriented to person, place, and time. She appears well-developed and well-nourished. No distress.  HENT:  Head: Normocephalic and atraumatic.  Right Ear: Hearing, tympanic membrane, external ear and ear canal normal.  Left Ear: Hearing, tympanic membrane, external ear and ear canal normal.  Nose: Nose normal.  Mouth/Throat: Uvula is midline, oropharynx is clear and moist and mucous membranes are normal. No oropharyngeal exudate, posterior oropharyngeal edema or posterior oropharyngeal erythema.  Eyes: Pupils are equal, round, and reactive to light. Conjunctivae and EOM are normal. No scleral icterus.  Neck: Normal range of motion. Neck supple. Carotid bruit is not present. No thyromegaly present.  Cardiovascular: Normal rate, regular rhythm, normal heart sounds and intact distal pulses.   No murmur heard. Pulses:      Radial pulses are 2+ on the right side, and 2+ on the left side.  Pulmonary/Chest: Effort normal and breath sounds normal. No respiratory distress. She has no wheezes. She has no rales.  Abdominal: Soft. Bowel sounds are normal. She exhibits no distension and no mass. There is no tenderness. There is no rebound and no guarding.  Musculoskeletal: Normal range of motion. She exhibits no edema.  Lymphadenopathy:    She has no cervical adenopathy.  Neurological: She is alert and oriented to person, place, and time.  CN grossly intact, station and gait intact  Skin: Skin is warm and dry. No rash noted.  Psychiatric: She has a normal mood and affect. Her behavior is normal. Judgment and thought content normal.  Nursing note and vitals reviewed.  Results for orders placed or performed in visit on 04/06/17  Lipid panel  Result Value Ref Range   Cholesterol 216 (H) 0 -  200 mg/dL   Triglycerides 278.0 (H) 0.0 - 149.0 mg/dL   HDL 35.90 (L) >39.00 mg/dL   VLDL 55.6 (H) 0.0 - 40.0 mg/dL   Total CHOL/HDL Ratio 6    NonHDL 179.76   TSH  Result Value Ref Range   TSH 0.71 0.35 - 4.50 uIU/mL  Basic metabolic panel  Result Value Ref Range   Sodium 137 135 - 145 mEq/L   Potassium 5.8 (H) 3.5 - 5.1 mEq/L   Chloride 102 96 - 112 mEq/L   CO2 25 19 - 32 mEq/L   Glucose, Bld 127 (H) 70 - 99 mg/dL   BUN 13 6 - 23 mg/dL   Creatinine, Ser 0.73 0.40 - 1.20 mg/dL   Calcium 10.2 8.4 - 10.5 mg/dL   GFR 81.90 >60.00 mL/min  T3  Result Value Ref Range   T3, Total 105 76 - 181 ng/dL  T4, free  Result Value Ref Range   Free T4 0.85 0.60 - 1.60 ng/dL  VITAMIN D 25 Hydroxy (Vit-D Deficiency, Fractures)  Result Value Ref Range   VITD 20.54 (L) 30.00 -  100.00 ng/mL  LDL cholesterol, direct  Result Value Ref Range   Direct LDL 137.0 mg/dL      Assessment & Plan:   Problem List Items Addressed This Visit    Advanced care planning/counseling discussion    Advanced directive: living will/HCPOA scanned and in chart 03/2017. HCPOA are daughter [name] and son Charlotte Crumb. Does not want prolonged life support if terminal condition.       Anxiety    Now off xanax and doing well. Will need to review this next visit.       Chronic lower back pain    Appreciate pain management by Dr Maryjean Ka. She is doing better with morphine - daughter notes improvement in pain control and mobility.       Relevant Medications   morphine (MSIR) 15 MG tablet   Decreased hearing, bilateral    Pt agrees to audiology evaluation.       Relevant Orders   Ambulatory referral to Audiology   Dyslipidemia, statin intol. (Chronic)    Chronic, off medication. H/o statin intolerance, h/o weakness to zetia. Will need to discuss chol medication options.  The 10-year ASCVD risk score Mikey Bussing DC Brooke Bonito., et al., 2013) is: 34.1%   Values used to calculate the score:     Age: 33 years     Sex: Female     Is  Non-Hispanic African American: No     Diabetic: No     Tobacco smoker: Yes     Systolic Blood Pressure: 751 mmHg     Is BP treated: Yes     HDL Cholesterol: 35.9 mg/dL     Total Cholesterol: 216 mg/dL       Essential hypertension    Chronic, stable. Continue current regimen. See phone note regarding hyperkalemia.      Health maintenance examination - Primary    Preventative protocols reviewed and updated unless pt declined. Discussed healthy diet and lifestyle.       Hyperthyroidism    TFTs stable off meds.      MCI (mild cognitive impairment) with memory loss    minicog at [facility] 15/20. MMSE today 19/30, 21 with cues. Discussed with pt and daughter. Daughter also concerned about possible memory deficits. Pt does not think she had any trouble with this. Encouraged ongoing mental stimulation, social interactions and physical activity as able. Discussed aricept option, pt declines at this time. I did ask them to return in 3 months for f/u visit and reassessment.       Tobacco abuse    Light smoker, continue to encourage cessation. Not candidate for lung cancer screening.       Vitamin D deficiency    rec start vit D 2000 IU daily.          Follow up plan: Return in about 3 months (around 07/14/2017) for follow up visit.  Ria Bush, MD

## 2017-04-13 NOTE — Telephone Encounter (Signed)
I don't remember reviewing labs with patient. plz call daughter -  1. Potassium was too elevated possibly due to atacand. I would like her to return to recheck levels and if staying elevated we may need to change bp med.  2. Vit D was low - rec start 2000 IU over the counter daily.  3. Thyroid function stable off thyroid medicine 4. Chol levels were high - would have her consider cholesterol medicine, we can discuss at next visit.

## 2017-04-13 NOTE — Assessment & Plan Note (Signed)
Light smoker, continue to encourage cessation. Not candidate for lung cancer screening.

## 2017-04-14 NOTE — Telephone Encounter (Signed)
Left message on vm per dpr relaying message per Dr. Danise Mina (Dr. Darnell Level).

## 2017-04-15 ENCOUNTER — Other Ambulatory Visit: Payer: Self-pay | Admitting: Neurosurgery

## 2017-04-15 DIAGNOSIS — M47816 Spondylosis without myelopathy or radiculopathy, lumbar region: Secondary | ICD-10-CM

## 2017-04-18 ENCOUNTER — Other Ambulatory Visit (INDEPENDENT_AMBULATORY_CARE_PROVIDER_SITE_OTHER): Payer: Medicare HMO

## 2017-04-18 DIAGNOSIS — E875 Hyperkalemia: Secondary | ICD-10-CM

## 2017-04-18 LAB — POTASSIUM: Potassium: 5.5 mEq/L — ABNORMAL HIGH (ref 3.5–5.1)

## 2017-04-20 ENCOUNTER — Other Ambulatory Visit: Payer: Self-pay | Admitting: Family Medicine

## 2017-04-20 DIAGNOSIS — Z1212 Encounter for screening for malignant neoplasm of rectum: Secondary | ICD-10-CM | POA: Diagnosis not present

## 2017-04-20 DIAGNOSIS — Z1211 Encounter for screening for malignant neoplasm of colon: Secondary | ICD-10-CM | POA: Diagnosis not present

## 2017-04-20 MED ORDER — CANDESARTAN CILEXETIL 16 MG PO TABS: 16.0000 mg | ORAL_TABLET | Freq: Every day | ORAL | 6 refills | Status: DC

## 2017-04-22 ENCOUNTER — Telehealth: Payer: Self-pay | Admitting: Family Medicine

## 2017-04-22 MED ORDER — CANDESARTAN CILEXETIL 16 MG PO TABS: 16.0000 mg | ORAL_TABLET | Freq: Every day | ORAL | 6 refills | Status: DC

## 2017-04-22 NOTE — Telephone Encounter (Signed)
Copied from [place] [id number]. Topic: Quick Communication - See Telephone Encounter >> Apr 22, 2017  9:02 AM Clack, Laban Emperor wrote: CRM for notification. See Telephone encounter for: Pt daughter [name] was calling for pt BP meds, she did not know the name of the medication. She states that she talk to someone a few days ago and they was going to call it in but has not. Didn't see an updated DPR on file to release information to daughter.  04/22/17.

## 2017-04-22 NOTE — Telephone Encounter (Signed)
Copied from [place] [id number]. Topic: Quick Communication - See Telephone Encounter >> Apr 22, 2017  3:43 PM Arletha Grippe wrote: CRM for notification. See Telephone encounter for:   04/22/17. Pt medication candesartan (ATACAND) 16 MG tablet  was sent to wrong pharmacy. Daughter is asking for you to send it to [facility]  Cb numer is [phone]

## 2017-04-22 NOTE — Telephone Encounter (Signed)
Left message on vm per dpr notifying pt's daughter, [name] (on dpr) Dr. Danise Mina sent the new dose of BP med, Atacand, on 04/20/17 to [facility] in [place] on [address].

## 2017-04-22 NOTE — Telephone Encounter (Signed)
Pt's daughter is requesting refills on her b/p meds. Please advise.

## 2017-04-22 NOTE — Telephone Encounter (Signed)
Atacand sent to [facility] per patient request.

## 2017-04-26 DIAGNOSIS — E0590 Thyrotoxicosis, unspecified without thyrotoxic crisis or storm: Secondary | ICD-10-CM | POA: Diagnosis not present

## 2017-04-30 ENCOUNTER — Ambulatory Visit
Admission: RE | Admit: 2017-04-30 | Discharge: 2017-04-30 | Disposition: A | Discharge status: Home / Self Care | Payer: Medicare HMO | Source: Ambulatory Visit | Attending: Neurosurgery | Admitting: Neurosurgery

## 2017-04-30 DIAGNOSIS — M47816 Spondylosis without myelopathy or radiculopathy, lumbar region: Secondary | ICD-10-CM

## 2017-04-30 LAB — COLOGUARD: Cologuard: NEGATIVE

## 2017-05-03 ENCOUNTER — Telehealth: Payer: Self-pay

## 2017-05-03 ENCOUNTER — Encounter: Payer: Self-pay | Admitting: Family Medicine

## 2017-05-03 DIAGNOSIS — I10 Essential (primary) hypertension: Secondary | ICD-10-CM | POA: Diagnosis not present

## 2017-05-03 DIAGNOSIS — M8580 Other specified disorders of bone density and structure, unspecified site: Secondary | ICD-10-CM | POA: Diagnosis not present

## 2017-05-03 DIAGNOSIS — E0590 Thyrotoxicosis, unspecified without thyrotoxic crisis or storm: Secondary | ICD-10-CM | POA: Diagnosis not present

## 2017-05-03 NOTE — Telephone Encounter (Signed)
Received result, negative, Cologuard. Left message on vm per dpr for pt's daughter, [name] (on dpr), relaying results.

## 2017-05-09 DIAGNOSIS — M47816 Spondylosis without myelopathy or radiculopathy, lumbar region: Secondary | ICD-10-CM | POA: Diagnosis not present

## 2017-05-09 DIAGNOSIS — M47818 Spondylosis without myelopathy or radiculopathy, sacral and sacrococcygeal region: Secondary | ICD-10-CM | POA: Diagnosis not present

## 2017-05-09 DIAGNOSIS — Z6826 Body mass index (BMI) 26.0-26.9, adult: Secondary | ICD-10-CM | POA: Diagnosis not present

## 2017-05-09 DIAGNOSIS — I10 Essential (primary) hypertension: Secondary | ICD-10-CM | POA: Diagnosis not present

## 2017-06-08 DIAGNOSIS — M47816 Spondylosis without myelopathy or radiculopathy, lumbar region: Secondary | ICD-10-CM | POA: Diagnosis not present

## 2017-06-08 DIAGNOSIS — M47818 Spondylosis without myelopathy or radiculopathy, sacral and sacrococcygeal region: Secondary | ICD-10-CM | POA: Diagnosis not present

## 2017-06-08 DIAGNOSIS — I10 Essential (primary) hypertension: Secondary | ICD-10-CM | POA: Diagnosis not present

## 2017-06-08 DIAGNOSIS — Z6826 Body mass index (BMI) 26.0-26.9, adult: Secondary | ICD-10-CM | POA: Diagnosis not present

## 2017-07-05 DIAGNOSIS — M47818 Spondylosis without myelopathy or radiculopathy, sacral and sacrococcygeal region: Secondary | ICD-10-CM | POA: Diagnosis not present

## 2017-07-05 DIAGNOSIS — I10 Essential (primary) hypertension: Secondary | ICD-10-CM | POA: Diagnosis not present

## 2017-07-05 DIAGNOSIS — Z6826 Body mass index (BMI) 26.0-26.9, adult: Secondary | ICD-10-CM | POA: Diagnosis not present

## 2017-07-05 DIAGNOSIS — M47816 Spondylosis without myelopathy or radiculopathy, lumbar region: Secondary | ICD-10-CM | POA: Diagnosis not present

## 2017-07-11 DIAGNOSIS — H02055 Trichiasis without entropian left lower eyelid: Secondary | ICD-10-CM | POA: Diagnosis not present

## 2017-07-11 DIAGNOSIS — H04123 Dry eye syndrome of bilateral lacrimal glands: Secondary | ICD-10-CM | POA: Diagnosis not present

## 2017-07-14 ENCOUNTER — Encounter: Payer: Self-pay | Admitting: Family Medicine

## 2017-07-14 ENCOUNTER — Ambulatory Visit (INDEPENDENT_AMBULATORY_CARE_PROVIDER_SITE_OTHER): Payer: Medicare HMO | Admitting: Family Medicine

## 2017-07-14 VITALS — BP 120/60 | HR 78 | Temp 98.9°F | Wt 143.5 lb

## 2017-07-14 DIAGNOSIS — E875 Hyperkalemia: Secondary | ICD-10-CM | POA: Diagnosis not present

## 2017-07-14 DIAGNOSIS — I5032 Chronic diastolic (congestive) heart failure: Secondary | ICD-10-CM

## 2017-07-14 DIAGNOSIS — I10 Essential (primary) hypertension: Secondary | ICD-10-CM

## 2017-07-14 DIAGNOSIS — E0590 Thyrotoxicosis, unspecified without thyrotoxic crisis or storm: Secondary | ICD-10-CM

## 2017-07-14 DIAGNOSIS — E785 Hyperlipidemia, unspecified: Secondary | ICD-10-CM | POA: Diagnosis not present

## 2017-07-14 DIAGNOSIS — F0390 Unspecified dementia without behavioral disturbance: Secondary | ICD-10-CM | POA: Diagnosis not present

## 2017-07-14 LAB — BASIC METABOLIC PANEL
BUN: 17 mg/dL (ref 6–23)
CO2: 30 meq/L (ref 19–32)
Calcium: 9.3 mg/dL (ref 8.4–10.5)
Chloride: 100 mEq/L (ref 96–112)
Creatinine, Ser: 0.68 mg/dL (ref 0.40–1.20)
GFR: 88.82 mL/min (ref >60.00)
GLUCOSE: 182 mg/dL — AB (ref 70–99)
POTASSIUM: 5.1 meq/L (ref 3.5–5.1)
SODIUM: 134 meq/L — AB (ref 135–145)

## 2017-07-14 LAB — TSH: TSH: 0.72 u[IU]/mL (ref 0.35–4.50)

## 2017-07-14 MED ORDER — DONEPEZIL HCL 5 MG PO TABS: 5.0000 mg | ORAL_TABLET | Freq: Every day | ORAL | 3 refills | Status: DC

## 2017-07-14 NOTE — Progress Notes (Signed)
BP 120/60 (BP Location: Left Arm, Patient Position: Sitting, Cuff Size: Normal)   Pulse 78   Temp 98.9 F (37.2 C) (Oral)   Wt 143 lb 8 oz (65.1 kg)   SpO2 98%   BMI 28.03 kg/m    CC: 3 mo f/u visit Subjective:    Patient ID: Meredith Gutierrez, female    DOB: [birth year], 77 y.o.   MRN: [id number]  HPI: Meredith Gutierrez is a 77 y.o. female presenting on 07/14/2017 for 3 month follow up   Here with daughter Meredith Gutierrez today.  Hyperkalemia on atacand - we decreased dose to 16mg  daily - due for recheck K. Daughter noticing increasing swelling asks about checking thyroid level.  HLD - chol levels were elevated on last check. H/o intolerance to statins and zetia even RYR. Cardiology was looking into repatha. They decline further medication trial at this time.   Late last year MMSE revealed ongoing concerns for memory loss ([facility]) - we discussed aricept last visit. Daughter has concerns about worsening memory over the last few months. Pt no longer drives. Daughter stays at home with her.   L 2nd digit has started swelling last few days. No h/o gout or RA.   Overdue for mammogram.  Cologuard negative 04/2017.   Relevant past medical, surgical, family and social history reviewed and updated as indicated. Interim medical history since our last visit reviewed. Allergies and medications reviewed and updated. Outpatient Medications Prior to Visit  Medication Sig Dispense Refill  . acetaminophen (TYLENOL) 500 MG tablet Take 2,300 mg by mouth daily.     Marland Kitchen aspirin EC 81 MG tablet Take 81 mg by mouth daily.    . candesartan (ATACAND) 16 MG tablet Take 1 tablet (16 mg total) daily by mouth. 30 tablet 6  . Cholecalciferol (VITAMIN D) 2000 units CAPS Take 1 capsule (2,000 Units total) by mouth daily. 30 capsule   . diltiazem (CARDIZEM CD) 240 MG 24 hr capsule Take 1 capsule (240 mg total) by mouth daily. 90 capsule 3  . morphine (MS CONTIN) 15 MG 12 hr tablet Take 1 tablet by mouth 2 (two) times daily.      Marland Kitchen morphine (MSIR) 15 MG tablet Take 1 tablet (15 mg total) by mouth every 6 (six) hours as needed for moderate pain.  0  . traMADol (ULTRAM) 50 MG tablet Take 1-2 tabs every 8 hours     No facility-administered medications prior to visit.      Per HPI unless specifically indicated in ROS section below Review of Systems     Objective:    BP 120/60 (BP Location: Left Arm, Patient Position: Sitting, Cuff Size: Normal)   Pulse 78   Temp 98.9 F (37.2 C) (Oral)   Wt 143 lb 8 oz (65.1 kg)   SpO2 98%   BMI 28.03 kg/m   Wt Readings from Last 3 Encounters:  07/14/17 143 lb 8 oz (65.1 kg)  04/13/17 140 lb (63.5 kg)  04/06/17 141 lb 4 oz (64.1 kg)    Physical Exam  Constitutional: She appears well-developed and well-nourished. No distress.  HENT:  Mouth/Throat: Oropharynx is clear and moist. No oropharyngeal exudate.  Eyes: Conjunctivae are normal. Pupils are equal, round, and reactive to light.  Cardiovascular: Normal rate, regular rhythm, normal heart sounds and intact distal pulses.  No murmur heard. Pulmonary/Chest: Effort normal and breath sounds normal. No respiratory distress. She has no wheezes. She has no rales.  Musculoskeletal: She exhibits no edema.  No  significant pedal edema  Skin: Skin is warm and dry. No rash noted.  Psychiatric: She has a normal mood and affect.  Nursing note and vitals reviewed.  Results for orders placed or performed in visit on 07/14/17  TSH  Result Value Ref Range   TSH 0.72 0.35 - 4.50 uIU/mL  Basic metabolic panel  Result Value Ref Range   Sodium 134 (L) 135 - 145 mEq/L   Potassium 5.1 3.5 - 5.1 mEq/L   Chloride 100 96 - 112 mEq/L   CO2 30 19 - 32 mEq/L   Glucose, Bld 182 (H) 70 - 99 mg/dL   BUN 17 6 - 23 mg/dL   Creatinine, Ser 0.68 0.40 - 1.20 mg/dL   Calcium 9.3 8.4 - 10.5 mg/dL   GFR 88.82 >60.00 mL/min    Lab Results  Component Value Date   CHOL 216 (H) 04/06/2017   HDL 35.90 (L) 04/06/2017   LDLCALC 123 02/06/2016    LDLDIRECT 137.0 04/06/2017   TRIG 278.0 (H) 04/06/2017   CHOLHDL 6 04/06/2017   The 10-year ASCVD risk score Meredith Gutierrez DC Jr., et al., 2013) is: 31.4%   Values used to calculate the score:     Age: 34 years     Sex: Female     Is Non-Hispanic African American: No     Diabetic: No     Tobacco smoker: Yes     Systolic Blood Pressure: 564 mmHg     Is BP treated: Yes     HDL Cholesterol: 35.9 mg/dL     Total Cholesterol: 216 mg/dL     Assessment & Plan:   Problem List Items Addressed This Visit    Chronic diastolic heart failure ([place])    Euvolemic on exam today. Daughter notes worsening pedal edema, stable today. Reviewed supportive care - leg elevation, avoiding sodium, increased water. She does not tolerate compression stockings.       Dementia - Primary    Significant deficits on MMSE, daughter expresses concerns as well however pt remains largely unconcerned. I do anticipate dementia is present, possibly early Alz vs vascular (CT 08/2016 abnormal). Consider MRI for further evaluation. Will start aricept 5mg  daily, discussed mechanism of medication, possible side effects to monitor. Reassess at 3 mo f/u visit. Watch for bradycardia, conduction abnormalities.       Relevant Medications   donepezil (ARICEPT) 5 MG tablet   Dyslipidemia, statin intol. (Chronic)    Chronic off medication - h/o statin and zetia intolerance. Cards was looking into PCSK9 inhibitor - encouraged they f/u with cards. They decline further oral cholesterol medication.       Essential hypertension    Chronic, stable. Doing well on lower atacand. Update K, continue current regimen.       Hyperthyroidism    H/o hyperthyroidism s/p radioactive iodine therapy. Previously saw endo, levels stabilized off medication. Update TSH today.       Relevant Orders   TSH (Completed)    Other Visit Diagnoses    Hyperkalemia       Relevant Orders   Basic metabolic panel (Completed)       Follow up plan: Return in about  3 months (around 10/11/2017), or if symptoms worsen or fail to improve, for follow up visit.  Ria Bush, MD

## 2017-07-14 NOTE — Patient Instructions (Addendum)
Labs today Try aricept 5mg  daily for memory.  Good to see you today! Return as needed or in 3 months for follow up visit.  For leg swelling - back off sodium in diet, elevate legs, increase water. Let us know how swelling does.

## 2017-07-16 NOTE — Assessment & Plan Note (Addendum)
H/o hyperthyroidism s/p radioactive iodine therapy. Previously saw endo, levels stabilized off medication. Update TSH today.

## 2017-07-16 NOTE — Assessment & Plan Note (Addendum)
Significant deficits on MMSE, daughter expresses concerns as well however pt remains largely unconcerned. I do anticipate dementia is present, possibly early Alz vs vascular (CT 08/2016 abnormal). Consider MRI for further evaluation. Will start aricept 5mg  daily, discussed mechanism of medication, possible side effects to monitor. Reassess at 3 mo f/u visit. Watch for bradycardia, conduction abnormalities.

## 2017-07-16 NOTE — Assessment & Plan Note (Addendum)
Chronic off medication - h/o statin and zetia intolerance. Cards was looking into PCSK9 inhibitor - encouraged they f/u with cards. They decline further oral cholesterol medication.

## 2017-07-16 NOTE — Assessment & Plan Note (Signed)
Euvolemic on exam today. Daughter notes worsening pedal edema, stable today. Reviewed supportive care - leg elevation, avoiding sodium, increased water. She does not tolerate compression stockings.

## 2017-07-16 NOTE — Assessment & Plan Note (Addendum)
Chronic, stable. Doing well on lower atacand. Update K, continue current regimen.

## 2017-07-20 ENCOUNTER — Ambulatory Visit: Payer: Medicare HMO | Attending: Audiology | Admitting: Audiology

## 2017-08-14 ENCOUNTER — Other Ambulatory Visit: Payer: Self-pay | Admitting: Family Medicine

## 2017-08-23 ENCOUNTER — Other Ambulatory Visit: Payer: Self-pay | Admitting: Cardiovascular Disease

## 2017-08-23 ENCOUNTER — Telehealth: Payer: Self-pay | Admitting: Cardiovascular Disease

## 2017-08-23 NOTE — Telephone Encounter (Signed)
I will discuss further with her tomorrow. Thank you for letting me know.   Dr. Purcell Nails

## 2017-08-23 NOTE — Telephone Encounter (Signed)
Returned call to daughter. She reports swelling in patient's ankles that is every day for the last month, had previously been on occasion. She elevates her legs, which helps. Daughter states that PCP adjusted her medications - candesartan dose changed. Denies weight gain. Patient has a little SOB, but has mild COPD per daughter.   Patient is scheduled to see K. Purcell Nails, DNP on 08/24/17 @ 0830

## 2017-08-23 NOTE — Telephone Encounter (Signed)
New message    Pt c/o swelling: STAT is pt has developed SOB within 24 hours  1) How much weight have you gained and in what time span? none  2) If swelling, where is the swelling located? In ankles   3) Are you currently taking a fluid pill? no  4) Are you currently SOB?  no  5) Do you have a log of your daily weights (if so, list)? no  6) Have you gained 3 pounds in a day or 5 pounds in a week? no  7) Have you traveled recently? No

## 2017-08-24 ENCOUNTER — Encounter: Payer: Self-pay | Admitting: Adult Health

## 2017-08-24 ENCOUNTER — Ambulatory Visit: Payer: Medicare HMO | Admitting: Adult Health

## 2017-08-24 VITALS — BP 142/74 | HR 74 | Ht 60.0 in | Wt 141.8 lb

## 2017-08-24 DIAGNOSIS — R609 Edema, unspecified: Secondary | ICD-10-CM | POA: Diagnosis not present

## 2017-08-24 DIAGNOSIS — I10 Essential (primary) hypertension: Secondary | ICD-10-CM

## 2017-08-24 DIAGNOSIS — I519 Heart disease, unspecified: Secondary | ICD-10-CM

## 2017-08-24 DIAGNOSIS — I2510 Atherosclerotic heart disease of native coronary artery without angina pectoris: Secondary | ICD-10-CM | POA: Diagnosis not present

## 2017-08-24 NOTE — Patient Instructions (Signed)
Medication Instructions:  NO CHANGES- Your physician recommends that you continue on your current medications as directed. Please refer to the Current Medication list given to you today.  If you need a refill on your cardiac medications before your next appointment, please call your pharmacy.  Testing/Procedures: Echocardiogram - Your physician has requested that you have an echocardiogram. Echocardiography is a painless test that uses sound waves to create images of your heart. It provides your doctor with information about the size and shape of your heart and how well your heart's chambers and valves are working. This procedure takes approximately one hour. There are no restrictions for this procedure. This will be performed at our [facility] location - [address].  Special Instructions: Please follow low sodium diet  Please wear compression stocking daily and off at bedtime  Follow-Up: Your physician wants you to follow-up in: 1 month with DR Hemingway (Elmer), DNP.    Thank you for choosing [facility] at [facility] With Less Salt Cooking with less salt is one way to reduce the amount of sodium you get from food. Depending on your condition and overall health, your health care provider or diet and nutrition specialist (dietitian) may recommend that you reduce your sodium intake. Most people should have less than 2,300 milligrams (mg) of sodium each day. If you have high blood pressure (hypertension), you may need to limit your sodium to 1,500 mg each day. Follow the tips below to help reduce your sodium intake. What do I need to know about cooking with less salt? Shopping  Buy sodium-free or low-sodium products. Look for the following words on food labels: ? Low-sodium. ? Sodium-free. ? Reduced-sodium. ? No salt added. ? Unsalted.  Buy fresh or frozen vegetables. Avoid canned vegetables.  Avoid buying meats or  protein foods that have been injected with broth or saline solution.  Avoid cured or smoked meats, such as hot dogs, bacon, salami, ham, and bologna. Reading food labels  Check the food label before buying or using packaged ingredients.  Look for products with no more than 140 mg of sodium in one serving.  Do not choose foods with salt as one of the first three ingredients on the ingredients list. If salt is one of the first three ingredients, it usually means the item is high in sodium, because ingredients are listed in order of amount in the food item. Cooking  Use herbs, seasonings without salt, and spices as substitutes for salt in foods.  Use sodium-free baking soda when baking.  Grill, braise, or roast foods to add flavor with less salt.  Avoid adding salt to pasta, rice, or hot cereals while cooking.  Drain and rinse canned vegetables before use.  Avoid adding salt when cooking sweets and desserts.  Cook with low-sodium ingredients. What are some salt alternatives? The following are herbs, seasonings, and spices that can be used instead of salt to give taste to your food. Herbs should be fresh or dried. Do not choose packaged mixes. Next to the name of the herb, spice, or seasoning are some examples of foods you can pair it with. Herbs  Bay leaves - Soups, meat and vegetable dishes, and spaghetti sauce.  Basil - [employer], soups, pasta, and fish dishes.  Cilantro - Meat, poultry, and vegetable dishes.  Chili powder - Marinades and Mexican dishes.  Chives - Salad dressings and potato dishes.  Cumin - Mexican dishes,  couscous, and meat dishes.  Dill - Fish dishes, sauces, and salads.  Fennel - Meat and vegetable dishes, breads, and cookies.  Garlic (do not use garlic salt) - [place] dishes, meat dishes, salad dressings, and sauces.  Marjoram - Soups, potato dishes, and meat dishes.  Oregano - Pizza and spaghetti sauce.  Parsley - Salads, soups, pasta, and  meat dishes.  Rosemary - [place] dishes, salad dressings, soups, and red meats.  Saffron - Fish dishes, pasta, and some poultry dishes.  Sage - Stuffings and sauces.  Tarragon - Fish and [employer].  Thyme - Stuffing, meat, and fish dishes. Seasonings  Lemon juice - Fish dishes, poultry dishes, vegetables, and salads.  Vinegar - Salad dressings, vegetables, and fish dishes. Spices  Cinnamon - Sweet dishes, such as cakes, cookies, and puddings.  Cloves - Gingerbread, puddings, and marinades for meats.  Curry - Vegetable dishes, fish and poultry dishes, and stir-fry dishes.  Ginger - Vegetables dishes, fish dishes, and stir-fry dishes.  Nutmeg - Pasta, vegetables, poultry, fish dishes, and custard. What are some low-sodium ingredients and foods?  Fresh or frozen fruits and vegetables with no sauce added.  Fresh or frozen whole meats, poultry, and fish with no sauce added.  Eggs.  Noodles, pasta, quinoa, rice.  Shredded or puffed wheat or puffed rice.  Regular or quick oats.  Milk, yogurt, hard cheeses, and low-sodium cheeses. Good cheese choices include Swiss, [place]. Always check the label for the serving size and sodium content.  Unsalted butter or margarine.  Unsalted nuts.  Sherbet or ice cream (keep to  cup per serving).  Homemade pudding.  Sodium-free baking soda and baking powder. This is not a complete list of low-sodium ingredients and foods. Contact your dietitian for more options. Summary  Cooking with less salt is one way to reduce the amount of sodium that you get from food.  Buy sodium-free or low-sodium products.  Check the food label before using or buying packaged ingredients.  Use herbs, seasonings without salt, and spices as substitutes for salt in foods. This information is not intended to replace advice given to you by your health care provider. Make sure you discuss any questions you have with your health  care provider. Document Released: 05/31/2005 Document Revised: 06/08/2016 Document Reviewed: 06/08/2016 Elsevier Interactive Patient Education  2017 [employer] DASH stands for "Dietary Approaches to Stop Hypertension." The DASH eating plan is a healthy eating plan that has been shown to reduce high blood pressure (hypertension). It may also reduce your risk for type 2 diabetes, heart disease, and stroke. The DASH eating plan may also help with weight loss. What are tips for following this plan? General guidelines  Avoid eating more than 2,300 mg (milligrams) of salt (sodium) a day. If you have hypertension, you may need to reduce your sodium intake to 1,500 mg a day.  Limit alcohol intake to no more than 1 drink a day for nonpregnant women and 2 drinks a day for men. One drink equals 12 oz of beer, 5 oz of wine, or 1 oz of hard liquor.  Work with your health care provider to maintain a healthy body weight or to lose weight. Ask what an ideal weight is for you.  Get at least 30 minutes of exercise that causes your heart to beat faster (aerobic exercise) most days of the week. Activities may include walking, swimming, or biking.  Work with your health care provider or diet  and nutrition specialist (dietitian) to adjust your eating plan to your individual calorie needs. Reading food labels  Check food labels for the amount of sodium per serving. Choose foods with less than 5 percent of the Daily Value of sodium. Generally, foods with less than 300 mg of sodium per serving fit into this eating plan.  To find whole grains, look for the word "whole" as the first word in the ingredient list. Shopping  Buy products labeled as "low-sodium" or "no salt added."  Buy fresh foods. Avoid canned foods and premade or frozen meals. Cooking  Avoid adding salt when cooking. Use salt-free seasonings or herbs instead of table salt or sea salt. Check with your health care provider or  pharmacist before using salt substitutes.  Do not fry foods. Cook foods using healthy methods such as baking, boiling, grilling, and broiling instead.  Cook with heart-healthy oils, such as olive, canola, soybean, or sunflower oil. Meal planning   Eat a balanced diet that includes: ? 5 or more servings of fruits and vegetables each day. At each meal, try to fill half of your plate with fruits and vegetables. ? Up to 6-8 servings of whole grains each day. ? Less than 6 oz of lean meat, poultry, or fish each day. A 3-oz serving of meat is about the same size as a deck of cards. One egg equals 1 oz. ? 2 servings of low-fat dairy each day. ? A serving of nuts, seeds, or beans 5 times each week. ? Heart-healthy fats. Healthy fats called Omega-3 fatty acids are found in foods such as flaxseeds and coldwater fish, like sardines, salmon, and mackerel.  Limit how much you eat of the following: ? Canned or prepackaged foods. ? Food that is high in trans fat, such as fried foods. ? Food that is high in saturated fat, such as fatty meat. ? Sweets, desserts, sugary drinks, and other foods with added sugar. ? Full-fat dairy products.  Do not salt foods before eating.  Try to eat at least 2 vegetarian meals each week.  Eat more home-cooked food and less restaurant, buffet, and fast food.  When eating at a restaurant, ask that your food be prepared with less salt or no salt, if possible. What foods are recommended? The items listed may not be a complete list. Talk with your dietitian about what dietary choices are best for you. Grains Whole-grain or whole-wheat bread. Whole-grain or whole-wheat pasta. Brown rice. [place]. Bulgur. Whole-grain and low-sodium cereals. Pita bread. Low-fat, low-sodium crackers. Whole-wheat flour tortillas. Vegetables Fresh or frozen vegetables (raw, steamed, roasted, or grilled). Low-sodium or reduced-sodium tomato and vegetable juice. Low-sodium or  reduced-sodium tomato sauce and tomato paste. Low-sodium or reduced-sodium canned vegetables. Fruits All fresh, dried, or frozen fruit. Canned fruit in natural juice (without added sugar). Meat and other protein foods Skinless chicken or [place]. Ground chicken or [place]. Pork with fat trimmed off. Fish and seafood. Egg whites. Dried beans, peas, or lentils. Unsalted nuts, nut butters, and seeds. Unsalted canned beans. Lean cuts of beef with fat trimmed off. Low-sodium, lean deli meat. Dairy Low-fat (1%) or fat-free (skim) milk. Fat-free, low-fat, or reduced-fat cheeses. Nonfat, low-sodium ricotta or cottage cheese. Low-fat or nonfat yogurt. Low-fat, low-sodium cheese. Fats and oils Soft margarine without trans fats. Vegetable oil. Low-fat, reduced-fat, or light mayonnaise and salad dressings (reduced-sodium). Canola, safflower, olive, soybean, and sunflower oils. Avocado. Seasoning and other foods Herbs. Spices. Seasoning mixes without salt. Unsalted popcorn and pretzels. Fat-free sweets.  What foods are not recommended? The items listed may not be a complete list. Talk with your dietitian about what dietary choices are best for you. Grains Baked goods made with fat, such as croissants, muffins, or some breads. Dry pasta or rice meal packs. Vegetables Creamed or fried vegetables. Vegetables in a cheese sauce. Regular canned vegetables (not low-sodium or reduced-sodium). Regular canned tomato sauce and paste (not low-sodium or reduced-sodium). Regular tomato and vegetable juice (not low-sodium or reduced-sodium). Angie Fava. Olives. Fruits Canned fruit in a light or heavy syrup. Fried fruit. Fruit in cream or butter sauce. Meat and other protein foods Fatty cuts of meat. Ribs. Fried meat. Berniece Salines. Sausage. Bologna and other processed lunch meats. Salami. Fatback. Hotdogs. Bratwurst. Salted nuts and seeds. Canned beans with added salt. Canned or smoked fish. Whole eggs or egg yolks. Chicken or [place]  with skin. Dairy Whole or 2% milk, cream, and half-and-half. Whole or full-fat cream cheese. Whole-fat or sweetened yogurt. Full-fat cheese. Nondairy creamers. Whipped toppings. Processed cheese and cheese spreads. Fats and oils Butter. Stick margarine. Lard. Shortening. Ghee. Bacon fat. Tropical oils, such as coconut, palm kernel, or palm oil. Seasoning and other foods Salted popcorn and pretzels. Onion salt, garlic salt, seasoned salt, table salt, and sea salt. Worcestershire sauce. Tartar sauce. Barbecue sauce. Teriyaki sauce. Soy sauce, including reduced-sodium. Steak sauce. Canned and packaged gravies. Fish sauce. Oyster sauce. Cocktail sauce. Horseradish that you find on the shelf. Ketchup. Mustard. Meat flavorings and tenderizers. Bouillon cubes. Hot sauce and Tabasco sauce. Premade or packaged marinades. Premade or packaged taco seasonings. Relishes. Regular salad dressings. Where to find more information:  National Heart, Lung, and [place]: https://wilson-eaton.com/  American Heart Association: www.heart.org Summary  The DASH eating plan is a healthy eating plan that has been shown to reduce high blood pressure (hypertension). It may also reduce your risk for type 2 diabetes, heart disease, and stroke.  With the DASH eating plan, you should limit salt (sodium) intake to 2,300 mg a day. If you have hypertension, you may need to reduce your sodium intake to 1,500 mg a day.  When on the DASH eating plan, aim to eat more fresh fruits and vegetables, whole grains, lean proteins, low-fat dairy, and heart-healthy fats.  Work with your health care provider or diet and nutrition specialist (dietitian) to adjust your eating plan to your individual calorie needs. This information is not intended to replace advice given to you by your health care provider. Make sure you discuss any questions you have with your health care provider. Document Released: 05/20/2011 Document Revised: 05/24/2016  Document Reviewed: 05/24/2016 Elsevier Interactive Patient Education  [employer].

## 2017-08-24 NOTE — Progress Notes (Signed)
Cardiology Office Note   Date:  08/24/2017   ID:  Meredith, Gutierrez [birth year], MRN [id number]  PCP:  Ria Bush, MD  Cardiologist:   Chief Complaint  Patient presents with  . Edema    bilateral ankle swelling Denies chest pain   . Hypertension  . Coronary Artery Disease     History of Present Illness: Meredith Gutierrez is a 77 y.o. female who presents for ongoing assessment and management of CAD S/P CABG (2003 LIMA to LAD and SVG to first diagonal, ligation of large LAD artery aneurysm, atretic LIMA with patent LAD at cardiac cath in 2013), without coronary events since that time.   She has hyperdynamic left ventricular systolic function, moderate LVH, diastolic dysfunction by echo, but no clinical heart failure since 2013. She has hyperlipidemia but has been intolerant to numerous attempts at lipid lowering therapy (rosuvastatin, simvastatin, Zetia and others) and is unable to take beta blockers due to bradycardia. She also has significant dementia and is cared for by her daughter.   She comes today with complaints of worsening edema. PCP recently adjusted her medications by lowering candesartan dose. She was recommended for support hose and cardiology follow up.   She denies chest pain, dyspnea on exertion weakness, PND, or dizziness.  She is medically compliant as her daughter takes care of her medications, as she lives with her.  Past Medical History:  Diagnosis Date  . Angina    never needed to take nitroglycerin.  . Anxiety   . Blood transfusion without reported diagnosis   . Cerebral atherosclerosis   . COPD (chronic obstructive pulmonary disease) ([facility])    02-16-13-"pt. denies this"-shows on CXR of 2- 2013.  Marland Kitchen Coronary artery disease 2003   s/p CABG 2014  . Diastolic CHF, on admit treated. 08/10/2011  . Fibromuscular dysplasia of renal artery ([facility])    right  . Heart murmur   . History of kidney stones 02-16-13   "hx. of overgrowth of muscle-causing some  stricture"renal artery stenosis-being followed [facility]  . Hyperlipidemia   . Hypertension   . Hyperthyroidism    [facility]  . Melanoma ([facility]) 2010   skin cancers, 1 melanoma removed back  . Osteoarthritis     Past Surgical History:  Procedure Laterality Date  . ABDOMINAL HYSTERECTOMY  1970   heavy bleeding  . APPENDECTOMY  1960s  . CARDIAC CATHETERIZATION  08/09/2011   no sign. coronary obstruction. LIMA is atretic w/excellent flow down the native LAD  . CARDIAC CATHETERIZATION  08/22/2001   no sign. coronary stenosis,  . CATARACT EXTRACTION, BILATERAL Bilateral   . CESAREAN SECTION    . CORONARY ARTERY BYPASS GRAFT  02-16-13   3'03-no bypasses -Had 2 aneurysm repairs(stents used)  . LAPAROTOMY N/A 02/20/2013   Procedure: EXPLORATORY LAPAROTOMY;  Meredith Morning, MD  . LEFT HEART CATHETERIZATION WITH CORONARY ANGIOGRAM N/A 08/09/2011   Procedure: LEFT HEART CATHETERIZATION WITH CORONARY ANGIOGRAM;  Surgeon: Sanda Klein, MD;  Location: [facility];  Service: Cardiovascular;  Laterality: N/A;  . NM MYOCAR PERF WALL MOTION  07/20/2010   normal  . SALPINGOOPHORECTOMY Bilateral 02/20/2013   benign seromucinous cystadenofibroma R ovary Meredith Morning, MD)  . TOOTH EXTRACTION    . TOTAL HIP ARTHROPLASTY Left 2015     Current Outpatient Medications  Medication Sig Dispense Refill  . acetaminophen (TYLENOL) 500 MG tablet Take 2,300 mg by mouth daily.     Marland Kitchen aspirin EC 81 MG tablet Take 81 mg by mouth daily.    Marland Kitchen  candesartan (ATACAND) 16 MG tablet Take 1 tablet (16 mg total) daily by mouth. 30 tablet 6  . Cholecalciferol (VITAMIN D) 2000 units CAPS Take 1 capsule (2,000 Units total) by mouth daily. 30 capsule   . diltiazem (CARDIZEM CD) 240 MG 24 hr capsule TAKE 1 CAPSULE BY MOUTH DAILY 90 capsule 0  . donepezil (ARICEPT) 5 MG tablet TAKE ONE TABLET BY MOUTH AT BEDTIME 30 tablet 3  . morphine (MS CONTIN) 15 MG 12 hr tablet Take 1 tablet by mouth 2 (two) times daily.    Marland Kitchen morphine (MSIR) 15 MG  tablet Take 1 tablet (15 mg total) by mouth every 6 (six) hours as needed for moderate pain.  0   No current facility-administered medications for this visit.     Allergies:   Codeine; Penicillin g; Penicillins; Prednisolone acetate; Meloxicam; Nitroglycerin; Crestor [rosuvastatin calcium]; Lopressor [metoprolol tartrate]; Prednisone; Sulfamethoxazole; and Zetia [ezetimibe]    Social History:  The patient  reports that she has been smoking cigarettes.  She has a 2.00 pack-year smoking history. she has never used smokeless tobacco. She reports that she does not drink alcohol or use drugs.   Family History:  The patient's family history includes Breast cancer in her maternal aunt; Coronary artery disease in her father; Dementia (age of onset: 6) in her mother; Diabetes in her brother; Hypertension in her brother and father; Hypotension in her mother and sister; Lung cancer in her daughter; Stroke in her mother; Thyroid cancer in her daughter.    ROS: All other systems are reviewed and negative. Unless otherwise mentioned in H&P    PHYSICAL EXAM: VS:  BP (!) 142/74 (BP Location: Right Arm, Patient Position: Sitting, Cuff Size: Normal)   Pulse 74   Ht 5' (1.524 m)   Wt 141 lb 12.8 oz (64.3 kg)   BMI 27.69 kg/m  , BMI Body mass index is 27.69 kg/m. GEN: Well nourished, well developed, in no acute distress  HEENT: normal mild edema beneath her eyes. Neck: no JVD, carotid bruits, or masses Cardiac: RRR; no murmurs, rubs, or gallops, non--pitting pretibial edema , mild ankle edema. Respiratory:  Clear to auscultation bilaterally, normal work of breathing GI: soft, nontender, nondistended, + BS MS: no deformity or atrophy  Skin: warm and dry, no rash Neuro:  Strength and sensation are intact Psych: euthymic mood, full affect   EKG: Normal sinus rhythm, occasional PACs, heart rate of 74 bpm.  Recent Labs: 09/01/2016: Hemoglobin 12.9; Platelets 293 07/14/2017: BUN 17; Creatinine, Ser  0.68; Potassium 5.1; Sodium 134; TSH 0.72    Lipid Panel    Component Value Date/Time   CHOL 216 (H) 04/06/2017 1113   TRIG 278.0 (H) 04/06/2017 1113   HDL 35.90 (L) 04/06/2017 1113   CHOLHDL 6 04/06/2017 1113   VLDL 55.6 (H) 04/06/2017 1113   LDLCALC 123 02/06/2016   LDLDIRECT 137.0 04/06/2017 1113      Wt Readings from Last 3 Encounters:  08/24/17 141 lb 12.8 oz (64.3 kg)  07/14/17 143 lb 8 oz (65.1 kg)  04/13/17 140 lb (63.5 kg)      Other studies Reviewed: Echocardiogram 08/27/11 - Left ventricle: The cavity size was normal. There was moderate concentric hypertrophy. Systolic function was normal. The estimated ejection fraction was in the range of 60% to 65%. Wall motion was normal; there were no regional wall motion abnormalities. Doppler parameters are consistent with abnormal left ventricular relaxation (grade 1 diastolic dysfunction). Doppler parameters are consistent with elevated mean left atrial filling  pressure. - Mitral valve: Calcified annulus. Trivial regurgitation. - Tricuspid valve: Mild regurgitation.  ASSESSMENT AND PLAN:  1.  Chronic LEE: Multifactorial in the setting of use of CCB, heavy salt use, eating processed meats, a lot of bread, and diet drinks heavy in sodium. This appears to be dependent edema.  She is instructed on low sodium diet and is provided with written materials as well. She is advised on support knee high socks to help with dependent edema as well  I will check echo to evaluate her current LV fx and valve status which may help with medication management. Will not add diuretic at this time. She is allergic to sulfa and would like to avoid these.   2. Hypertension: Slightly elevated today. Her daughter states that it is normally lower at home  Will not make adjustments at this time.   3. CAD: No new symptoms at this time. Continue current regimen. Checking echo.    Current medicines are reviewed at length with the  patient today.    Labs/ tests ordered today include: Echocardiogram   Phill Myron. West Pugh, ANP, AACC   08/24/2017 9:28 AM    [facility] [address], [place], [place] [address] Phone: [phone]; Fax: [phone]

## 2017-09-01 ENCOUNTER — Ambulatory Visit (HOSPITAL_COMMUNITY): Payer: Medicare HMO | Attending: Cardiology

## 2017-09-01 ENCOUNTER — Other Ambulatory Visit: Payer: Self-pay

## 2017-09-01 DIAGNOSIS — I519 Heart disease, unspecified: Secondary | ICD-10-CM

## 2017-09-01 DIAGNOSIS — I5030 Unspecified diastolic (congestive) heart failure: Secondary | ICD-10-CM | POA: Diagnosis not present

## 2017-09-01 DIAGNOSIS — I10 Essential (primary) hypertension: Secondary | ICD-10-CM

## 2017-09-01 DIAGNOSIS — R609 Edema, unspecified: Secondary | ICD-10-CM

## 2017-09-02 ENCOUNTER — Telehealth: Payer: Self-pay | Admitting: Adult Health

## 2017-09-02 NOTE — Telephone Encounter (Signed)
Notes recorded by Lendon Colonel, NP on 09/02/2017 at 8:56 AM EDT Echocardiogram reviewed.Grade one diastolic dysfunction is noted, stiffening of the heart on relaxation. Will need to be strict on BP control. Has some calcium on her aortic valve but nothing that is causing any issues.

## 2017-09-02 NOTE — Telephone Encounter (Signed)
New Message:    Pt returning a call regarding Echo results

## 2017-09-02 NOTE — Telephone Encounter (Signed)
Patient daughter aware of results  

## 2017-09-07 ENCOUNTER — Encounter: Payer: Self-pay | Admitting: Family Medicine

## 2017-09-07 ENCOUNTER — Ambulatory Visit (INDEPENDENT_AMBULATORY_CARE_PROVIDER_SITE_OTHER): Payer: Medicare HMO | Admitting: Family Medicine

## 2017-09-07 VITALS — BP 118/68 | HR 75 | Temp 98.3°F | Wt 141.0 lb

## 2017-09-07 DIAGNOSIS — R1310 Dysphagia, unspecified: Secondary | ICD-10-CM | POA: Diagnosis not present

## 2017-09-07 DIAGNOSIS — Z720 Tobacco use: Secondary | ICD-10-CM

## 2017-09-07 DIAGNOSIS — R1319 Other dysphagia: Secondary | ICD-10-CM

## 2017-09-07 NOTE — Assessment & Plan Note (Addendum)
Several month h/o dysphagia pills > solids > liquids without red flags. No significant GERD. She is a smoker. Anticipate related to esophageal stricture. Will check barium swallow and refer to GI. Pt/daughter agree with plan.

## 2017-09-07 NOTE — Patient Instructions (Addendum)
We will check a swallow test - and refer you to GI for further evaluation of difficulty swallowing.   Dysphagia Dysphagia is trouble swallowing. This condition occurs when solids and liquids stick in a person's throat on the way down to the stomach, or when food takes longer to get to the stomach. You may have problems swallowing food, liquids, or both. You may also have pain while trying to swallow. It may take you more time and effort to swallow something. What are the causes? This condition is caused by:  Problems with the muscles. They may make it difficult for you to move food and liquids through the tube that connects your mouth to your stomach (esophagus). You may have ulcers, scar tissue, or inflammation that blocks the normal passage of food and liquids. Causes of these problems include: ? Acid reflux from your stomach into your esophagus (gastroesophageal reflux). ? Infections. ? Radiation treatment for cancer. ? Medicines taken without enough fluids to wash them down into your stomach.  Nerve problems. These prevent signals from being sent to the muscles of your esophagus to squeeze (contract) and move what you swallow down to your stomach.  Globus pharyngeus. This is a common problem that involves feeling like something is stuck in the throat or a sense of trouble with swallowing even though nothing is wrong with the swallowing passages.  Stroke. This can affect the nerves and make it difficult to swallow.  Certain conditions, such as cerebral palsy or Parkinson disease.  What are the signs or symptoms? Common symptoms of this condition include:  A feeling that solids or liquids are stuck in your throat on the way down to the stomach.  Food taking too long to get to the stomach.  Other symptoms include:  Food moving back from your stomach to your mouth (regurgitation).  Noises coming from your throat.  Chest discomfort with swallowing.  A feeling of fullness when  swallowing.  Drooling, especially when the throat is blocked.  Pain while swallowing.  Heartburn.  Coughing or gagging while trying to swallow.  How is this diagnosed? This condition is diagnosed by:  Barium X-ray. In this test, you swallow a white substance (contrast medium)that sticks to the inside of your esophagus. X-ray images are then taken.  Endoscopy. In this test, a flexible telescope is inserted down your throat to look at your esophagus and your stomach.  CT scans and MRI.  How is this treated? Treatment for dysphagia depends on the cause of the condition:  If the dysphagia is caused by acid reflux or infection, medicines may be used. They may include antibiotics and heartburn medicines.  If the dysphagia is caused by problems with your muscles, swallowing therapy may be used to help you strengthen your swallowing muscles. You may have to do specific exercises to strengthen the muscles or stretch them.  If the dysphagia is caused by a blockage or mass, procedures to remove the blockage may be done. You may need surgery and a feeding tube.  You may need to make diet changes. Ask your health care provider for specific instructions. Follow these instructions at home: Eating and drinking  Try to eat soft food that is easier to swallow.  Follow any diet changes as told by your health care provider.  Cut your food into small pieces and eat slowly.  Eat and drink only when you are sitting upright.  Do not drink alcohol or caffeine. If you need help quitting, ask your health care  provider. General instructions  Check your weight every day to make sure you are not losing weight.  Take over-the-counter and prescription medicines only as told by your health care provider.  If you were prescribed an antibiotic medicine, take it as told by your health care provider. Do not stop taking the antibiotic even if you start to feel better.  Do not use any products that contain  nicotine or tobacco, such as cigarettes and e-cigarettes. If you need help quitting, ask your health care provider.  Keep all follow-up visits as told by your health care provider. This is important. Contact a health care provider if:  You lose weight because you cannot swallow.  You cough when you drink liquids (aspiration).  You cough up partially digested food. Get help right away if:  You cannot swallow your saliva.  You have shortness of breath or a fever, or both.  You have a hoarse voice and also have trouble swallowing. Summary  Dysphagia is trouble swallowing. This condition occurs when solids and liquids stick in a person's throat on the way down to the stomach, or when food takes longer to get to the stomach.  Dysphagia has many possible causes and symptoms.  Treatment for dysphagia depends on the cause of the condition. This information is not intended to replace advice given to you by your health care provider. Make sure you discuss any questions you have with your health care provider. Document Released: 05/28/2000 Document Revised: 05/20/2016 Document Reviewed: 05/20/2016 Elsevier Interactive Patient Education  2017 [employer].

## 2017-09-07 NOTE — Progress Notes (Signed)
BP 118/68 (BP Location: Left Arm, Patient Position: Sitting, Cuff Size: Normal)   Pulse 75   Temp 98.3 F (36.8 C) (Oral)   Wt 141 lb (64 kg)   SpO2 97%   BMI 27.54 kg/m    CC: dysphagia Subjective:    Patient ID: Meredith Gutierrez, female    DOB: 09-27-1940, 77 y.o.   MRN: [id number]  HPI: Meredith Gutierrez is a 77 y.o. female presenting on 09/07/2017 for Dysphagia (Has trouble swallowing when eating and taking meds. Issued started about 2 mos ago. Did not have this issue before goiter removal last year. )   Here with daughter Meredith Gutierrez today.   At least 2 month h/o dysphagia even with small tablets more than with solids (sandwiches). Not too much trouble with liquids. Denies pain. Some early satiety endorsed - smaller meals throughout the day. Denies any GERD symptoms. No unexpected weight changes. Rare nausea, no vomiting. She is on chronic opiate therapy for chronic lower back pain. Ginger tablet helps with upset stomach.   H/o hyperthyroidism s/p RAI therapy. No h/o throat surgery.  H/o dementia  Relevant past medical, surgical, family and social history reviewed and updated as indicated. Interim medical history since our last visit reviewed. Allergies and medications reviewed and updated. Outpatient Medications Prior to Visit  Medication Sig Dispense Refill  . acetaminophen (TYLENOL) 500 MG tablet Take 2,300 mg by mouth daily.     Marland Kitchen aspirin EC 81 MG tablet Take 81 mg by mouth daily.    . candesartan (ATACAND) 16 MG tablet Take 1 tablet (16 mg total) daily by mouth. 30 tablet 6  . Cholecalciferol (VITAMIN D) 2000 units CAPS Take 1 capsule (2,000 Units total) by mouth daily. 30 capsule   . diltiazem (CARDIZEM CD) 240 MG 24 hr capsule TAKE 1 CAPSULE BY MOUTH DAILY 90 capsule 0  . donepezil (ARICEPT) 5 MG tablet TAKE ONE TABLET BY MOUTH AT BEDTIME 30 tablet 3  . morphine (MS CONTIN) 15 MG 12 hr tablet Take 1 tablet by mouth 2 (two) times daily.    Marland Kitchen morphine (MSIR) 15 MG tablet Take 1  tablet (15 mg total) by mouth every 6 (six) hours as needed for moderate pain.  0   No facility-administered medications prior to visit.      Per HPI unless specifically indicated in ROS section below Review of Systems     Objective:    BP 118/68 (BP Location: Left Arm, Patient Position: Sitting, Cuff Size: Normal)   Pulse 75   Temp 98.3 F (36.8 C) (Oral)   Wt 141 lb (64 kg)   SpO2 97%   BMI 27.54 kg/m   Wt Readings from Last 3 Encounters:  09/07/17 141 lb (64 kg)  08/24/17 141 lb 12.8 oz (64.3 kg)  07/14/17 143 lb 8 oz (65.1 kg)    Physical Exam  Constitutional: She appears well-developed and well-nourished. No distress.  HENT:  Mouth/Throat: Oropharynx is clear and moist. No oropharyngeal exudate.  Cardiovascular: Normal rate, regular rhythm, normal heart sounds and intact distal pulses.  No murmur heard. Pulmonary/Chest: Effort normal and breath sounds normal. No respiratory distress. She has no wheezes. She has no rales.  Abdominal: Soft. Normal appearance and bowel sounds are normal. She exhibits no distension and no mass. There is no hepatosplenomegaly. There is no tenderness. There is no rigidity, no rebound, no guarding, no CVA tenderness and negative Murphy's sign.  Musculoskeletal: She exhibits no edema.  Psychiatric: She has a normal  mood and affect.  Nursing note and vitals reviewed.  Results for orders placed or performed in visit on 07/14/17  TSH  Result Value Ref Range   TSH 0.72 0.35 - 4.50 uIU/mL  Basic metabolic panel  Result Value Ref Range   Sodium 134 (L) 135 - 145 mEq/L   Potassium 5.1 3.5 - 5.1 mEq/L   Chloride 100 96 - 112 mEq/L   CO2 30 19 - 32 mEq/L   Glucose, Bld 182 (H) 70 - 99 mg/dL   BUN 17 6 - 23 mg/dL   Creatinine, Ser 0.68 0.40 - 1.20 mg/dL   Calcium 9.3 8.4 - 10.5 mg/dL   GFR 88.82 >60.00 mL/min      Assessment & Plan:   Problem List Items Addressed This Visit    Esophageal dysphagia - Primary    Several month h/o dysphagia  pills > solids > liquids without red flags. No significant GERD. She is a smoker. Anticipate related to esophageal stricture. Will check barium swallow and refer to GI. Pt/daughter agree with plan.       Relevant Orders   DG Esophagus   Ambulatory referral to Gastroenterology   Tobacco abuse       No orders of the defined types were placed in this encounter.  Orders Placed This Encounter  Procedures  . DG Esophagus    Standing Status:   Future    Standing Expiration Date:   11/08/2018    Order Specific Question:   Reason for Exam (SYMPTOM  OR DIAGNOSIS REQUIRED)    Answer:   dysphagia    Order Specific Question:   Preferred imaging location?    Answer:   [id number]    Order Specific Question:   Radiology Contrast Protocol - do NOT remove file path    Answer:   [email or web address]  . Ambulatory referral to Gastroenterology    Referral Priority:   Routine    Referral Type:   Consultation    Referral Reason:   Specialty Services Required    Number of Visits Requested:   1    Follow up plan: Return if symptoms worsen or fail to improve.  Ria Bush, MD

## 2017-09-08 ENCOUNTER — Encounter: Payer: Self-pay | Admitting: Nurse Practitioner

## 2017-09-08 DIAGNOSIS — E0590 Thyrotoxicosis, unspecified without thyrotoxic crisis or storm: Secondary | ICD-10-CM | POA: Diagnosis not present

## 2017-09-12 ENCOUNTER — Ambulatory Visit
Admission: RE | Admit: 2017-09-12 | Discharge: 2017-09-12 | Disposition: A | Discharge status: Home / Self Care | Payer: Medicare HMO | Source: Ambulatory Visit | Attending: Family Medicine | Admitting: Family Medicine

## 2017-09-12 DIAGNOSIS — K449 Diaphragmatic hernia without obstruction or gangrene: Secondary | ICD-10-CM | POA: Diagnosis not present

## 2017-09-12 DIAGNOSIS — R1310 Dysphagia, unspecified: Secondary | ICD-10-CM

## 2017-09-12 DIAGNOSIS — R1319 Other dysphagia: Secondary | ICD-10-CM

## 2017-09-19 ENCOUNTER — Ambulatory Visit: Payer: Medicare HMO | Admitting: Cardiology

## 2017-09-21 DIAGNOSIS — Z6826 Body mass index (BMI) 26.0-26.9, adult: Secondary | ICD-10-CM | POA: Diagnosis not present

## 2017-09-21 DIAGNOSIS — M47818 Spondylosis without myelopathy or radiculopathy, sacral and sacrococcygeal region: Secondary | ICD-10-CM | POA: Diagnosis not present

## 2017-09-21 DIAGNOSIS — M47816 Spondylosis without myelopathy or radiculopathy, lumbar region: Secondary | ICD-10-CM | POA: Diagnosis not present

## 2017-09-21 DIAGNOSIS — I10 Essential (primary) hypertension: Secondary | ICD-10-CM | POA: Diagnosis not present

## 2017-09-28 NOTE — Progress Notes (Signed)
Cardiology Office Note   Date:  09/29/2017   ID:  Meredith Gutierrez, [name] Jul 15, 1940, MRN [id number]  PCP:  Ria Bush, MD  Cardiologist:  Dr. Sallyanne Kuster  Chief Complaint  Patient presents with  . Follow-up  . Hypertension  . Coronary Artery Disease     History of Present Illness: Meredith Gutierrez is a 77 y.o. female who presents for ongoing assessment and management of CAD 2003 atretic LIMA with patent LAD. She has hyperdynamic left ventricular systolic function, moderate LVH, diastolic dysfunction by echo, but no clinical heart failure since 2013.   She has hyperlipidemia but has been intolerant to numerous attempts at lipid lowering therapy (rosuvastatin, simvastatin, Zetia and others) and is unable to take beta blockers due to bradycardia. She also has significant dementia and is cared for by her daughter  When seen last on 08/24/2017 she had complaints of worsening lower extremity edema.  Was recommended for support hose.  She denied chest pain weakness or dyspnea.  She is here today without any new cardiac complaints.  The patient has refused support hose.  She is not very active but will walk to the mailbox and back and around her home.  She denies any symptoms associated with this.   She is to see GI on follow-up as she was found to have a hiatal hernia;  patient is scheduled for April 23 at 1:30 PM.  Past Medical History:  Diagnosis Date  . Angina    never needed to take nitroglycerin.  . Anxiety   . Blood transfusion without reported diagnosis   . Cerebral atherosclerosis   . COPD (chronic obstructive pulmonary disease) ([place])    02-16-13-"pt. denies this"-shows on CXR of 2- 2013.  Marland Kitchen Coronary artery disease 2003   s/p CABG 2014  . Diastolic CHF, on admit treated. 08/10/2011  . Fibromuscular dysplasia of renal artery (HCC)    right  . Heart murmur   . History of kidney stones 02-16-13   "hx. of overgrowth of muscle-causing some stricture"renal artery stenosis-being followed  [place]  . Hyperlipidemia   . Hypertension   . Hyperthyroidism    [facility]  . Melanoma ([place]) 2010   skin cancers, 1 melanoma removed back  . Osteoarthritis     Past Surgical History:  Procedure Laterality Date  . ABDOMINAL HYSTERECTOMY  1970   heavy bleeding  . APPENDECTOMY  1960s  . CARDIAC CATHETERIZATION  08/09/2011   no sign. coronary obstruction. LIMA is atretic w/excellent flow down the native LAD  . CARDIAC CATHETERIZATION  08/22/2001   no sign. coronary stenosis,  . CATARACT EXTRACTION, BILATERAL Bilateral   . CESAREAN SECTION    . CORONARY ARTERY BYPASS GRAFT  02-16-13   3'03-no bypasses -Had 2 aneurysm repairs(stents used)  . LAPAROTOMY N/A 02/20/2013   Procedure: EXPLORATORY LAPAROTOMY;  [name], MD  . LEFT HEART CATHETERIZATION WITH CORONARY ANGIOGRAM N/A 08/09/2011   Procedure: LEFT HEART CATHETERIZATION WITH CORONARY ANGIOGRAM;  Surgeon: Sanda Klein, MD;  Location: [facility];  Service: Cardiovascular;  Laterality: N/A;  . NM MYOCAR PERF WALL MOTION  07/20/2010   normal  . SALPINGOOPHORECTOMY Bilateral 02/20/2013   benign seromucinous cystadenofibroma R ovary [name], MD)  . TOOTH EXTRACTION    . TOTAL HIP ARTHROPLASTY Left 2015     Current Outpatient Medications  Medication Sig Dispense Refill  . acetaminophen (TYLENOL) 500 MG tablet Take 2,300 mg by mouth daily.     Marland Kitchen aspirin EC 81 MG tablet Take 81 mg by  mouth daily.    . candesartan (ATACAND) 16 MG tablet Take 1 tablet (16 mg total) daily by mouth. 30 tablet 6  . Cholecalciferol (VITAMIN D) 2000 units CAPS Take 1 capsule (2,000 Units total) by mouth daily. 30 capsule   . diltiazem (CARDIZEM CD) 240 MG 24 hr capsule TAKE 1 CAPSULE BY MOUTH DAILY 90 capsule 0  . donepezil (ARICEPT) 5 MG tablet TAKE ONE TABLET BY MOUTH AT BEDTIME 30 tablet 3  . morphine (MS CONTIN) 15 MG 12 hr tablet Take 1 tablet by mouth 2 (two) times daily.    Marland Kitchen morphine (MSIR) 15 MG tablet Take 1 tablet (15 mg total) by mouth every 6  (six) hours as needed for moderate pain.  0   No current facility-administered medications for this visit.     Allergies:   Codeine; Penicillin g; Penicillins; Prednisolone acetate; Meloxicam; Nitroglycerin; Crestor [rosuvastatin calcium]; Lopressor [metoprolol tartrate]; Prednisone; Sulfamethoxazole; and Zetia [ezetimibe]    Social History:  The patient  reports that she has been smoking cigarettes.  She has a 2.00 pack-year smoking history. She has never used smokeless tobacco. She reports that she does not drink alcohol or use drugs.   Family History:  The patient's family history includes Breast cancer in her maternal aunt; Coronary artery disease in her father; Dementia (age of onset: 25) in her mother; Diabetes in her brother; Hypertension in her brother and father; Hypotension in her mother and sister; Lung cancer in her daughter; Stroke in her mother; Thyroid cancer in her daughter.    ROS: All other systems are reviewed and negative. Unless otherwise mentioned in H&P    PHYSICAL EXAM: VS:  BP 136/84   Pulse 67   Ht 5' (1.524 m)   Wt 140 lb (63.5 kg)   BMI 27.34 kg/m  , BMI Body mass index is 27.34 kg/m. GEN: Well nourished, well developed, in no acute distress  HEENT: normal  Neck: no JVD, carotid bruits, or masses Cardiac: RRR; no murmurs, rubs, or gallops,no edema  Respiratory:  Clear to auscultation bilaterally, normal work of breathing GI: soft, nontender, nondistended, + BS MS: no deformity or atrophy  Skin: warm and dry, no rash Neuro:  Strength and sensation are intact Psych: euthymic mood, full affect   EKG: Not completed during this hospitalization.  Recent Labs: 07/14/2017: BUN 17; Creatinine, Ser 0.68; Potassium 5.1; Sodium 134; TSH 0.72    Lipid Panel    Component Value Date/Time   CHOL 216 (H) 04/06/2017 1113   TRIG 278.0 (H) 04/06/2017 1113   HDL 35.90 (L) 04/06/2017 1113   CHOLHDL 6 04/06/2017 1113   VLDL 55.6 (H) 04/06/2017 1113   LDLCALC 123  02/06/2016   LDLDIRECT 137.0 04/06/2017 1113      Wt Readings from Last 3 Encounters:  09/29/17 140 lb (63.5 kg)  09/07/17 141 lb (64 kg)  08/24/17 141 lb 12.8 oz (64.3 kg)      Other studies Reviewed: Echocardiogram 08/26/2011 - Left ventricle: The cavity size was normal. There was moderate concentric hypertrophy. Systolic function was normal. The estimated ejection fraction was in the range of 60% to 65%. Wall motion was normal; there were no regional wall motion abnormalities. Doppler parameters are consistent with abnormal left ventricular relaxation (grade 1 diastolic dysfunction). Doppler parameters are consistent with elevated mean left atrial filling pressure. - Mitral valve: Calcified annulus. Trivial regurgitation. - Tricuspid valve: Mild regurgitation.  ASSESSMENT AND PLAN:  1.  Coronary artery disease: History of coronary artery bypass  grafting, with atretic to LAD.  She offers no complaints of chest discomfort, treated medically.  If new symptoms occur can consider repeating ischemic testing but with age comorbidities and dementia it may be best to continue medical management.  Will defer to her primary cardiologist for opinions regarding this should she become symptomatic  2.  Hypertension: Blood pressure is moderately controlled currently.  No changes in her medication at this time.   3.  Chronic lower extremity edema: The patient has refused support hose.  She does not have significant edema on my examination today.  The patient's daughter continues to monitor this.  The patient admits to not adhering to low-sodium diet but her daughter is trying to monitor and decrease of salty foods that come into the home.  As her daughter does grocery shopping.  4.  Hypercholesterolemia: The patient is intolerant to multiple statins and has been tried on several the past.  She is a candidate for referral to pharmacy to discuss [place].  I have explained this to the  patient, and need to have better control of cholesterol status with her multiple comorbidities.  Her daughter is in agreement to have a conversation with pharmacy but they are concerned about the cost.  Current medicines are reviewed at length with the patient today.    Labs/ tests ordered today include: None  Phill Myron. West Pugh, ANP, AACC   09/29/2017 11:54 AM    [facility] [address] [place], [place] [address] Phone: [phone]; Fax: [phone]

## 2017-09-29 ENCOUNTER — Encounter: Payer: Self-pay | Admitting: Adult Health

## 2017-09-29 ENCOUNTER — Ambulatory Visit: Payer: Medicare HMO | Admitting: Adult Health

## 2017-09-29 VITALS — BP 136/84 | HR 67 | Ht 60.0 in | Wt 140.0 lb

## 2017-09-29 DIAGNOSIS — I10 Essential (primary) hypertension: Secondary | ICD-10-CM

## 2017-09-29 DIAGNOSIS — I2510 Atherosclerotic heart disease of native coronary artery without angina pectoris: Secondary | ICD-10-CM

## 2017-09-29 DIAGNOSIS — E7800 Pure hypercholesterolemia, unspecified: Secondary | ICD-10-CM

## 2017-09-29 NOTE — Patient Instructions (Signed)
Medication Instructions:  NO CHANGES- Your physician recommends that you continue on your current medications as directed. Please refer to the Current Medication list given to you today.  If you need a refill on your cardiac medications before your next appointment, please call your pharmacy.  Follow-Up: Your physician wants you to follow-up in: 6 months with D Croitoru. You should receive a reminder letter in the mail two months in advance. If you do not receive a letter, please call our office 01-2018 to schedule the 03-2018 follow-up appointment.   Thank you for choosing [facility] at [facility]!!

## 2017-09-30 ENCOUNTER — Telehealth: Payer: Self-pay | Admitting: Adult Health

## 2017-09-30 NOTE — Telephone Encounter (Signed)
Called patient and LVM to call back to schedule appointment with the pharmacist.

## 2017-10-04 ENCOUNTER — Encounter: Payer: Self-pay | Admitting: Nurse Practitioner

## 2017-10-04 ENCOUNTER — Ambulatory Visit: Payer: Medicare HMO | Admitting: Nurse Practitioner

## 2017-10-04 VITALS — BP 122/78 | HR 80 | Ht 60.0 in | Wt 141.2 lb

## 2017-10-04 DIAGNOSIS — R1310 Dysphagia, unspecified: Secondary | ICD-10-CM

## 2017-10-04 DIAGNOSIS — R1319 Other dysphagia: Secondary | ICD-10-CM

## 2017-10-04 NOTE — Progress Notes (Signed)
Chief Complaint: dysphagia  Referring Provider:  Ria Bush, MD      ASSESSMENT AND PLAN;   #1. 77 yo female with CAD / CABG, COPD, diastolic dysfunction by echo, and hyperlipidemia. She is referred for a 2-3 month hx of solid food dysphagia, progressive it seems but without associated weight loss. No hx of GERD. Barium swallow with tablet suggests dysmotility with frequent tertiary contractions / spasms and stasis. No masses. No reflux seen, tablet passed easily. Takes Morphine for back pain. Narcotics contributing to esophageal dysmotility? -This may be purely esophageal dysmotility but sometimes empirical dilation, especially of UES can be helpful. Offered EGD but patient declined. She would like to eat slower, take smaller bites with fluids following each bite. Daughter will monitor patient and she or patient will contact us back if symptoms progress.   #2. Colon cancer screening. Doesn't want a colonoscopy. She gets Cologuard testing, last one Nov 2018 was negative.   #3. Hyperthyroidism / multinodular thyroid, s/p radioactive iodine tx last March  HPI:    77 year old female, new to the practice, referred by PCP for evaluation of dysphagia.  Patient is here with her daughter who helps with history.  Patient gives a 2 to 65-month history of solid food dysphagia as well as problems swallowing pills.  Bread is particularly problematic. No problems with liquids.  Initially the dysphagia was more intermittent, now occurring more often.  Daughter notices mother stroking throat with fingers from top to bottom when eating sometimes (as if trying to facilitate passage of food). Patient thinks she might be ingesting to large of bites though it does not sound like she has really changed her eating habits to account for the new swallowing problems.  No history of GERD.  She has no odynophagia.  Patient has no other GI complaints.  No bowel changes or blood in stool.  She gets Cologuard for  colon cancer screening.  Her last Cologuard was in November 2018 and negative.  She does not want a colonoscopy.    Past Medical History:  Diagnosis Date  . Angina    never needed to take nitroglycerin.  . Anxiety   . Blood transfusion without reported diagnosis   . Cerebral atherosclerosis   . COPD (chronic obstructive pulmonary disease) ([place])    02-16-13-"pt. denies this"-shows on CXR of 2- 2013.  Marland Kitchen Coronary artery disease 2003   s/p CABG 2014  . Diastolic CHF, on admit treated. 08/10/2011  . Fibromuscular dysplasia of renal artery (HCC)    right  . Heart murmur   . History of kidney stones 02-16-13   "hx. of overgrowth of muscle-causing some stricture"renal artery stenosis-being followed [facility]  . Hyperlipidemia   . Hypertension   . Hyperthyroidism    Balan  . Melanoma ([place]) 2010   skin cancers, 1 melanoma removed back  . Osteoarthritis      Past Surgical History:  Procedure Laterality Date  . ABDOMINAL HYSTERECTOMY  1970   heavy bleeding  . APPENDECTOMY  1960s  . CARDIAC CATHETERIZATION  08/09/2011   no sign. coronary obstruction. LIMA is atretic w/excellent flow down the native LAD  . CARDIAC CATHETERIZATION  08/22/2001   no sign. coronary stenosis,  . CATARACT EXTRACTION, BILATERAL Bilateral   . CESAREAN SECTION    . CORONARY ARTERY BYPASS GRAFT  02-16-13   3'03-no bypasses -Had 2 aneurysm repairs(stents used)  . LAPAROTOMY N/A 02/20/2013   Procedure: EXPLORATORY LAPAROTOMY;  [name], MD  . [place]  WITH CORONARY ANGIOGRAM N/A 08/09/2011   Procedure: LEFT HEART CATHETERIZATION WITH CORONARY ANGIOGRAM;  Surgeon: Sanda Klein, MD;  Location: [facility] CATH [facility];  Service: Cardiovascular;  Laterality: N/A;  . NM MYOCAR PERF WALL MOTION  07/20/2010   normal  . SALPINGOOPHORECTOMY Bilateral 02/20/2013   benign seromucinous cystadenofibroma R ovary [name], MD)  . TOOTH EXTRACTION    . TOTAL HIP ARTHROPLASTY Left 2015   Family History  Problem Relation  Age of Onset  . Stroke Mother   . Hypotension Mother   . Dementia Mother 76  . Coronary artery disease Father   . Hypertension Father   . Breast cancer Maternal Aunt   . Hypotension Sister   . Hypertension Brother   . Diabetes Brother   . Lung cancer Daughter   . Thyroid cancer Daughter    Social History   Tobacco Use  . Smoking status: Light Tobacco Smoker    Packs/day: 0.10    Years: 20.00    Pack years: 2.00    Types: Cigarettes  . Smokeless tobacco: Never Used  . Tobacco comment: smoking cessation info given  Substance Use Topics  . Alcohol use: No  . Drug use: No   Current Outpatient Medications  Medication Sig Dispense Refill  . acetaminophen (TYLENOL) 500 MG tablet Take 2,300 mg by mouth daily.     Marland Kitchen aspirin EC 81 MG tablet Take 81 mg by mouth daily.    . Cholecalciferol (VITAMIN D) 2000 units CAPS Take 1 capsule (2,000 Units total) by mouth daily. 30 capsule   . diltiazem (CARDIZEM CD) 240 MG 24 hr capsule TAKE 1 CAPSULE BY MOUTH DAILY 90 capsule 0  . donepezil (ARICEPT) 5 MG tablet TAKE ONE TABLET BY MOUTH AT BEDTIME 30 tablet 3  . morphine (MS CONTIN) 15 MG 12 hr tablet Take 1 tablet by mouth 2 (two) times daily.    Marland Kitchen morphine (MSIR) 15 MG tablet Take 1 tablet (15 mg total) by mouth every 6 (six) hours as needed for moderate pain.  0   No current facility-administered medications for this visit.    Allergies  Allergen Reactions  . Codeine Anaphylaxis and Shortness Of Breath    Shortness of breath  . Penicillin G Shortness Of Breath and Rash  . Penicillins Anaphylaxis    Shortness of breath  . Prednisolone Acetate Nausea Only and Shortness Of Breath  . Meloxicam Hives  . Nitroglycerin   . Crestor [Rosuvastatin Calcium] Other (See Comments)    Weakness with statins  . Lopressor [Metoprolol Tartrate] Other (See Comments)    Bradycardia   . Prednisone Hives and Swelling    Shortness of breath  . Sulfamethoxazole Hives and Swelling  . Zetia [Ezetimibe]  Other (See Comments)    Weakness     Review of Systems: All systems reviewed and negative except where noted in HPI.   Physical Exam:    Wt Readings from Last 3 Encounters:  10/04/17 141 lb 3.2 oz (64 kg)  09/29/17 140 lb (63.5 kg)  09/07/17 141 lb (64 kg)    BP 122/78   Pulse 80   Ht 5' (1.524 m)   Wt 141 lb 3.2 oz (64 kg)   SpO2 94%   BMI 27.58 kg/m  Constitutional:  Well-developed, white female in no acute distress. Psychiatric: Normal mood and affect. Behavior is normal. EENT: Pupils normal.  Conjunctivae are normal. No scleral icterus. Neck supple.  Cardiovascular: Normal rate, regular rhythm. No edema Pulmonary/chest: Effort normal and breath  sounds normal. No wheezing, rales or rhonchi. Abdominal: Soft, nondistended. Nontender. Bowel sounds active throughout. There are no masses palpable. No hepatomegaly. Neurological: Alert and oriented to person place and time. Skin: Skin is warm and dry. No rashes noted.  Tye Savoy, NP  10/04/2017, 1:33 PM  Cc: Ria Bush, MD

## 2017-10-04 NOTE — Patient Instructions (Signed)
If you are age 79 or older, your body mass index should be between 23-30. Your Body mass index is 27.58 kg/m. If this is out of the aforementioned range listed, please consider follow up with your Primary Care Provider.  If you are age 55 or younger, your body mass index should be between 19-25. Your Body mass index is 27.58 kg/m. If this is out of the aformentioned range listed, please consider follow up with your Primary Care Provider.    Advised patient to eat small bites, chew well with liquids in between bites to avoid food impaction.  Please call back if symptoms worsen. Thank you for choosing me and [facility].   Tye Savoy, NP

## 2017-10-05 NOTE — Progress Notes (Signed)
Agree with assessment and plan. Symptoms very likely could be due to dysmotility based on barium study, but an EGD with empiric dilation of the UES to treat any component of cricopharyngeal stenosis, and to get a good look at the GEJ I think would be reasonable.

## 2017-10-10 DIAGNOSIS — H43813 Vitreous degeneration, bilateral: Secondary | ICD-10-CM | POA: Diagnosis not present

## 2017-10-10 DIAGNOSIS — H02005 Unspecified entropion of left lower eyelid: Secondary | ICD-10-CM | POA: Diagnosis not present

## 2017-10-10 DIAGNOSIS — H5211 Myopia, right eye: Secondary | ICD-10-CM | POA: Diagnosis not present

## 2017-10-10 DIAGNOSIS — H40053 Ocular hypertension, bilateral: Secondary | ICD-10-CM | POA: Diagnosis not present

## 2017-11-28 ENCOUNTER — Other Ambulatory Visit: Payer: Self-pay | Admitting: Cardiovascular Disease

## 2017-12-06 ENCOUNTER — Other Ambulatory Visit: Payer: Self-pay

## 2017-12-06 ENCOUNTER — Emergency Department (HOSPITAL_COMMUNITY): Payer: Medicare HMO

## 2017-12-06 ENCOUNTER — Telehealth: Payer: Self-pay | Admitting: Cardiovascular Disease

## 2017-12-06 ENCOUNTER — Encounter (HOSPITAL_COMMUNITY): Payer: Self-pay

## 2017-12-06 ENCOUNTER — Emergency Department (HOSPITAL_COMMUNITY)
Admission: EM | Admit: 2017-12-06 | Discharge: 2017-12-06 | Disposition: A | Discharge status: Home / Self Care | Payer: Medicare HMO | Attending: Emergency Medicine | Admitting: Emergency Medicine

## 2017-12-06 DIAGNOSIS — E785 Hyperlipidemia, unspecified: Secondary | ICD-10-CM | POA: Insufficient documentation

## 2017-12-06 DIAGNOSIS — Z7982 Long term (current) use of aspirin: Secondary | ICD-10-CM | POA: Insufficient documentation

## 2017-12-06 DIAGNOSIS — R0602 Shortness of breath: Secondary | ICD-10-CM | POA: Diagnosis not present

## 2017-12-06 DIAGNOSIS — I2510 Atherosclerotic heart disease of native coronary artery without angina pectoris: Secondary | ICD-10-CM | POA: Insufficient documentation

## 2017-12-06 DIAGNOSIS — Z79899 Other long term (current) drug therapy: Secondary | ICD-10-CM | POA: Insufficient documentation

## 2017-12-06 DIAGNOSIS — M7989 Other specified soft tissue disorders: Secondary | ICD-10-CM | POA: Diagnosis not present

## 2017-12-06 DIAGNOSIS — F17210 Nicotine dependence, cigarettes, uncomplicated: Secondary | ICD-10-CM | POA: Diagnosis not present

## 2017-12-06 DIAGNOSIS — I5032 Chronic diastolic (congestive) heart failure: Secondary | ICD-10-CM | POA: Diagnosis not present

## 2017-12-06 DIAGNOSIS — J449 Chronic obstructive pulmonary disease, unspecified: Secondary | ICD-10-CM | POA: Insufficient documentation

## 2017-12-06 DIAGNOSIS — I110 Hypertensive heart disease with heart failure: Secondary | ICD-10-CM | POA: Insufficient documentation

## 2017-12-06 LAB — CBC
HCT: 41.9 % (ref 36.0–46.0)
Hemoglobin: 13.7 g/dL (ref 12.0–15.0)
MCH: 29.1 pg (ref 26.0–34.0)
MCHC: 32.7 g/dL (ref 30.0–36.0)
MCV: 89 fL (ref 78.0–100.0)
Platelets: 289 10*3/uL (ref 150–400)
RBC: 4.71 MIL/uL (ref 3.87–5.11)
RDW: 12.7 % (ref 11.5–15.5)
WBC: 11.5 10*3/uL — ABNORMAL HIGH (ref 4.0–10.5)

## 2017-12-06 LAB — BASIC METABOLIC PANEL
Anion gap: 9 (ref 5–15)
BUN: 12 mg/dL (ref 8–23)
CO2: 22 mmol/L (ref 22–32)
Calcium: 9.3 mg/dL (ref 8.9–10.3)
Chloride: 103 mmol/L (ref 98–111)
Creatinine, Ser: 0.73 mg/dL (ref 0.44–1.00)
GFR calc Af Amer: >60 mL/min (ref >60)
GFR calc non Af Amer: >60 mL/min (ref >60)
Glucose, Bld: 181 mg/dL — ABNORMAL HIGH (ref 70–99)
Potassium: 3.9 mmol/L (ref 3.5–5.1)
Sodium: 134 mmol/L — ABNORMAL LOW (ref 135–145)

## 2017-12-06 LAB — I-STAT TROPONIN, ED: Troponin i, poc: 0 ng/mL (ref 0.00–0.08)

## 2017-12-06 MED ORDER — FUROSEMIDE 20 MG PO TABS: 20.0000 mg | ORAL_TABLET | Freq: Two times a day (BID) | ORAL | 0 refills | Status: DC

## 2017-12-06 MED ORDER — POTASSIUM CHLORIDE ER 10 MEQ PO TBCR: 10.0000 meq | EXTENDED_RELEASE_TABLET | Freq: Every day | ORAL | 0 refills | Status: DC

## 2017-12-06 MED ORDER — ALBUTEROL SULFATE HFA 108 (90 BASE) MCG/ACT IN AERS: 1.0000 | INHALATION_SPRAY | Freq: Four times a day (QID) | RESPIRATORY_TRACT | 0 refills | Status: DC | PRN

## 2017-12-06 NOTE — Discharge Instructions (Addendum)
You were seen in the ER for trouble breathing. We are unsure of the exact reason you are having this symptom, but believe it could be related to your tobacco use and COPD.  Please stop smoking as this can have adverse effects on your overall health as well as the problem you have recently been having with breathing.  We are also sending you home with an inhaler to use as needed for trouble breathing, you may use 1 to 2 puffs every 6 hours.  Additionally the swelling in the legs could be due to having too much fluid which also can lead to difficulty breathing.  We are sending home with Lasix, this is a pill to help remove fluid.  Please take this twice per day.  We are also sending home with a potassium supplement because Lasix can lower your potassium.  Please take these medications as prescribed.  We have prescribed you new medication(s) today. Discuss the medications prescribed today with your pharmacist as they can have adverse effects and interactions with your other medicines including over the counter and prescribed medications. Seek medical evaluation if you start to experience new or abnormal symptoms after taking one of these medicines, seek care immediately if you start to experience difficulty breathing, feeling of your throat closing, facial swelling, or rash as these could be indications of a more serious allergic reaction  We would like you to follow-up closely with Dr. Orene Desanctis within the next 3 days for reevaluation.  Return to the ER at anytime for new or worsening symptoms including but not limited to trouble breathing, chest pain, fever, passing out, or any other concerns.

## 2017-12-06 NOTE — ED Notes (Addendum)
Pt's daughter states pt was recently diagnosed with dementia and has been smoking more frequently because she forgets when she has already done so. Pt and family member educated on importance of smoking cessation.

## 2017-12-06 NOTE — Telephone Encounter (Signed)
Daughter just called as Meredith Gutierrez, states everything is fine and her dad has a follow up apt on 12/12/17

## 2017-12-06 NOTE — Telephone Encounter (Addendum)
New message   Patient seen in ED today for SOB  Pt c/o Shortness Of Breath: STAT if SOB developed within the last 24 hours or pt is noticeably SOB on the phone  1. Are you currently SOB (can you hear that pt is SOB on the phone)? NO per daughter  2. How long have you been experiencing SOB? 2 days  3. Are you SOB when sitting or when up moving around? Moving around  4. Are you currently experiencing any other symptoms? Swelling     1) How much weight have you gained and in what time span? N/A  2) If swelling, where is the swelling located? ANKLES  3) Are you currently taking a fluid pill? YES, starting Lasix today  4) Are you currently SOB? NO  5) Do you have a log of your daily weights (if so, list)? NO  6) Have you gained 3 pounds in a day or 5 pounds in a week? N/A  7) Have you traveled recently? NO

## 2017-12-06 NOTE — ED Provider Notes (Signed)
[facility] Provider Note   CSN: [id number] Arrival date & time: 12/06/17  1243     History   Chief Complaint Chief Complaint  Patient presents with  . Shortness of Breath    HPI Meredith Gutierrez is a 77 y.o. female with a hx of tobacco use, diastolic HF (grade 1 diastolic dysfunction, EF 95-28%), CAD s/p CABG- LIMA to LAD, SVG to 1st diagonal, with ligation of LAD aneurysm, COPD, HTN, hyperlipidemia, dementia, and hyperthyroidism who presents to the ED with complaints of episodic dyspnea that occurred yesterday and today. Patient states that yesterday around 11:30 AM she went outside to have a cigarette, immediately after smoking she developed shortness of breath, this lasted for less than 30 minutes and resolved. Today she again went outside to have a cigarette and developed dyspnea which lasted for about an hour and is resolved at present. There were no specific alleviating/aggravating factors to her dyspnea, no change with exertion. Dyspnea spontaneously resolved. She has not necessarily had a hx of similar. Per patient's daughter patient with recent dx of dementia, she has been smoking more frequently as she forgets when she last smoked. She has a hx of COPD but does not have prescriptions for daily or rescue inhalers. Daughter and patient do not think patient was wheezing, no coughing. She has had baseline lower extremity edema that has been ongoing several months, unchanged, seen by PCP and cardiology without specific underlying cause identified- they have speculated diet related. Denies fever, chills, chest pain, nausea, vomiting, diaphoresis, wheezing, or cough. Denies leg pain, hemoptysis, recent surgery/trauma, recent long travel, hormone use, personal hx of cancer tx in past 6 months (had melanoma > 20 years ago), or hx of DVT/PE. Patient has had congestion/rhinorrhea which she has been taking an OTC allergy pill for. Patient's cardiologist is Dr.  Sallyanne Kuster.    HPI  Past Medical History:  Diagnosis Date  . Angina    never needed to take nitroglycerin.  . Anxiety   . Blood transfusion without reported diagnosis   . Cerebral atherosclerosis   . COPD (chronic obstructive pulmonary disease) ([facility])    02-16-13-"pt. denies this"-shows on CXR of 2- 2013.  Marland Kitchen Coronary artery disease 2003   s/p CABG 2014  . Diastolic CHF, on admit treated. 08/10/2011  . Fibromuscular dysplasia of renal artery ([facility])    right  . Heart murmur   . History of kidney stones 02-16-13   "hx. of overgrowth of muscle-causing some stricture"renal artery stenosis-being followed [facility]  . Hyperlipidemia   . Hypertension   . Hyperthyroidism    [place]  . Melanoma ([place]) 2010   skin cancers, 1 melanoma removed back  . Osteoarthritis     Patient Active Problem List   Diagnosis Date Noted  . Esophageal dysphagia 09/07/2017  . Health maintenance examination 04/13/2017  . Decreased hearing, bilateral 04/13/2017  . Advanced care planning/counseling discussion 04/09/2017  . Vitamin D deficiency 02/14/2017  . Fibromuscular dysplasia of renal artery ([place])   . Anxiety 02/09/2017  . Chronic lower back pain 02/09/2017  . Dementia 02/09/2017  . History of melanoma 02/09/2017  . Hyperthyroidism   . History of left renal artery stenosis 02/14/2016  . Chronic diastolic heart failure ([place]) 08/10/2011  . Dyslipidemia, statin intol. 08/07/2011  . Sinus bradycardia, beta blocker intol. 08/07/2011  . Chest pain, negative MI, cath with atretic LIMA, and excellent flow down the native LAD,patent grafts otherwise 08/05/2011  . Essential hypertension 08/05/2011  . Tobacco  abuse 08/05/2011  . CAD (coronary artery disease), CABG 08/24/2001, off pump, LIMA to LAD, SVG to 1st diagonal. Ligation of LAD artery aneurysm. 08/05/2011    Past Surgical History:  Procedure Laterality Date  . ABDOMINAL HYSTERECTOMY  1970   heavy bleeding  . APPENDECTOMY  1960s  . CARDIAC CATHETERIZATION   08/09/2011   no sign. coronary obstruction. LIMA is atretic w/excellent flow down the native LAD  . CARDIAC CATHETERIZATION  08/22/2001   no sign. coronary stenosis,  . CATARACT EXTRACTION, BILATERAL Bilateral   . CESAREAN SECTION    . CORONARY ARTERY BYPASS GRAFT  02-16-13   3'03-no bypasses -Had 2 aneurysm repairs(stents used)  . LAPAROTOMY N/A 02/20/2013   Procedure: EXPLORATORY LAPAROTOMY;  [name], MD  . LEFT HEART CATHETERIZATION WITH CORONARY ANGIOGRAM N/A 08/09/2011   Procedure: LEFT HEART CATHETERIZATION WITH CORONARY ANGIOGRAM;  Surgeon: Sanda Klein, MD;  Location: [facility] CATH [facility];  Service: Cardiovascular;  Laterality: N/A;  . NM MYOCAR PERF WALL MOTION  07/20/2010   normal  . SALPINGOOPHORECTOMY Bilateral 02/20/2013   benign seromucinous cystadenofibroma R ovary [name], MD)  . TOOTH EXTRACTION    . TOTAL HIP ARTHROPLASTY Left 2015     OB History   None      Home Medications    Prior to Admission medications   Medication Sig Start Date End Date Taking? Authorizing Provider  acetaminophen (TYLENOL) 500 MG tablet Take 2,300 mg by mouth daily.  02/09/17   Ria Bush, MD  aspirin EC 81 MG tablet Take 81 mg by mouth daily.    [provider]  Cholecalciferol (VITAMIN D) 2000 units CAPS Take 1 capsule (2,000 Units total) by mouth daily. 04/13/17   Ria Bush, MD  diltiazem (CARDIZEM CD) 240 MG 24 hr capsule TAKE 1 CAPSULE BY MOUTH DAILY 11/29/17   Croitoru, Mihai, MD  donepezil (ARICEPT) 5 MG tablet TAKE ONE TABLET BY MOUTH AT BEDTIME 08/15/17   Ria Bush, MD  morphine (MS CONTIN) 15 MG 12 hr tablet Take 1 tablet by mouth 2 (two) times daily. 06/15/17   [provider]  morphine (MSIR) 15 MG tablet Take 1 tablet (15 mg total) by mouth every 6 (six) hours as needed for moderate pain. 04/13/17   Ria Bush, MD    Family History Family History  Problem Relation Age of Onset  . Stroke Mother   . Hypotension Mother   . Dementia  Mother 67  . Coronary artery disease Father   . Hypertension Father   . Breast cancer Maternal Aunt   . Hypotension Sister   . Hypertension Brother   . Diabetes Brother   . Lung cancer Daughter   . Thyroid cancer Daughter     Social History Social History   Tobacco Use  . Smoking status: Light Tobacco Smoker    Packs/day: 0.10    Years: 20.00    Pack years: 2.00    Types: Cigarettes  . Smokeless tobacco: Never Used  . Tobacco comment: smoking cessation info given  Substance Use Topics  . Alcohol use: No  . Drug use: No     Allergies   Codeine; Penicillin g; Penicillins; Prednisolone acetate; Meloxicam; Nitroglycerin; Crestor [rosuvastatin calcium]; Lopressor [metoprolol tartrate]; Prednisone; Sulfamethoxazole; and Zetia [ezetimibe]   Review of Systems Review of Systems  Constitutional: Negative for chills and fever.  HENT: Positive for congestion and rhinorrhea. Negative for ear pain and sore throat.   Respiratory: Positive for shortness of breath (resolved at present). Negative for cough.  Cardiovascular: Positive for leg swelling (chronic unchanged). Negative for chest pain and palpitations.  Gastrointestinal: Negative for abdominal pain, constipation, diarrhea, nausea and vomiting.  Neurological: Negative for dizziness, syncope, weakness, light-headedness and numbness.  All other systems reviewed and are negative.  Physical Exam Updated Vital Signs BP 139/73   Pulse (!) 58   Temp 98.5 F (36.9 C) (Oral)   Ht 5\' 2"  (1.575 m)   Wt 63.5 kg (140 lb)   SpO2 97%   BMI 25.61 kg/m   Physical Exam  Constitutional: She appears well-developed and well-nourished.  Non-toxic appearance. No distress.  HENT:  Head: Normocephalic and atraumatic.  Right Ear: Tympanic membrane normal.  Left Ear: Tympanic membrane normal.  Nose: Mucosal edema (congestion) present. Right sinus exhibits no maxillary sinus tenderness and no frontal sinus tenderness. Left sinus exhibits no  maxillary sinus tenderness and no frontal sinus tenderness.  Mouth/Throat: Uvula is midline and oropharynx is clear and moist.  Eyes: Conjunctivae are normal. Right eye exhibits no discharge. Left eye exhibits no discharge.  Cardiovascular: Normal rate and regular rhythm.  No murmur heard. Pulses:      Dorsalis pedis pulses are 2+ on the right side, and 2+ on the left side.  Pulmonary/Chest: Effort normal. No accessory muscle usage. No tachypnea. No respiratory distress. She has decreased breath sounds (bibasilar). She has no wheezes. She has no rhonchi. She has no rales.  Abdominal: Soft. She exhibits no distension. There is no tenderness.  Musculoskeletal:       Right lower leg: She exhibits edema (1+, trace to lower leg, does not extend past the knee, symmetric). She exhibits no tenderness.       Left lower leg: She exhibits edema (1+, trace to lower leg, does not extend past the knee, symmetric). She exhibits no tenderness.  Neurological: She is alert.  Clear speech. 5/5 strength with plantar/dorsiflexion bilaterally. Sensation grossly intact.   Skin: Skin is warm and dry. No rash noted.  Psychiatric: She has a normal mood and affect. Her behavior is normal.  Nursing note and vitals reviewed.  ED Treatments / Results  Labs Results for orders placed or performed during the hospital encounter of 24/58/09  Basic metabolic panel  Result Value Ref Range   Sodium 134 (L) 135 - 145 mmol/L   Potassium 3.9 3.5 - 5.1 mmol/L   Chloride 103 98 - 111 mmol/L   CO2 22 22 - 32 mmol/L   Glucose, Bld 181 (H) 70 - 99 mg/dL   BUN 12 8 - 23 mg/dL   Creatinine, Ser 0.73 0.44 - 1.00 mg/dL   Calcium 9.3 8.9 - 10.3 mg/dL   GFR calc non Af Amer >60 >60 mL/min   GFR calc Af Amer >60 >60 mL/min   Anion gap 9 5 - 15  CBC  Result Value Ref Range   WBC 11.5 (H) 4.0 - 10.5 K/uL   RBC 4.71 3.87 - 5.11 MIL/uL   Hemoglobin 13.7 12.0 - 15.0 g/dL   HCT 41.9 36.0 - 46.0 %   MCV 89.0 78.0 - 100.0 fL   MCH 29.1  26.0 - 34.0 pg   MCHC 32.7 30.0 - 36.0 g/dL   RDW 12.7 11.5 - 15.5 %   Platelets 289 150 - 400 K/uL  I-stat troponin, ED  Result Value Ref Range   Troponin i, poc 0.00 0.00 - 0.08 ng/mL   Comment 3            EKG EKG Interpretation  Date/Time:  Tuesday December 06 2017 12:52:00 EDT Ventricular Rate:  83 PR Interval:  176 QRS Duration: 80 QT Interval:  388 QTC Calculation: 455 R Axis:   11 Text Interpretation:  Sinus rhythm with frequent Premature ventricular complexes Possible Left atrial enlargement Anterior infarct , age undetermined Abnormal ECG Confirmed by Virgel Manifold [phone]) on 12/06/2017 3:19:16 PM   Radiology Dg Chest 2 View  Result Date: 12/06/2017 CLINICAL DATA:  Shortness of breath and bilateral ankle swelling today. Smoker. EXAM: CHEST - 2 VIEW COMPARISON:  09/01/2016. FINDINGS: Stable borderline enlarged cardiac silhouette and tortuous aorta. Stable post CABG changes. The lungs remain mildly hyperexpanded with mildly prominent interstitial markings. Thoracic spine degenerative changes. Right shoulder calcified loose bodies. IMPRESSION: No acute abnormality. Stable borderline cardiomegaly and mild changes of COPD. Electronically Signed   By: Claudie Revering M.D.   On: 12/06/2017 13:17    Procedures Procedures (including critical care time)  Medications Ordered in ED Medications - No data to display   Initial Impression / Assessment and Plan / ED Course  I have reviewed the triage vital signs and the nursing notes.  Pertinent labs & imaging results that were available during my care of the patient were reviewed by me and considered in my medical decision making (see chart for details).  Prior Studies Reviewed:  Echo 09/01/17:  Study Conclusions  - Left ventricle: The cavity size was normal. There was mild   concentric hypertrophy. Systolic function was normal. The   estimated ejection fraction was in the range of 55% to 60%. Wall   motion was normal; there were  no regional wall motion   abnormalities. Doppler parameters are consistent with abnormal   left ventricular relaxation (grade 1 diastolic dysfunction). - Aortic valve: Trileaflet; moderately thickened, severely   calcified leaflets. Transvalvular velocity was within the normal   range. There was no stenosis.   08/08/17 Cardiac Catheterization:  IMPRESSIONS:  No significant coronary obstruction that might cause arterial insufficiency is seen. Although the left internal mammary artery is atretic there is excellent flow down the native LAD artery as well as competitive flow from the vein graft to the diagonal artery.  It is possible that the patient has subendocardial ischemia due to elevated interventricular end-diastolic pressure and a severely hyperdynamic pattern of contraction. Nitrates and other vasodilators should be avoided. Beta blockers will be the preferred antihypertensive but there uses limited by relative bradycardia. RECOMMENDATION:  Medical therapy.stop nitrates and avoid direct vasodilators. Prefer beta blockers and judicious use of diuretics.     Sanda Klein, MD, [place] [phone] 08/09/2011 4:04 PM  Patient is a 77 yo female with a hx of CAD, diastolic dysfunction, and COPD who presents to the ED for episodic shortness of breath after smoking. She has had recent increase in smoking due to forgetting last time she smoked, recent dx of dementia. Patient does have symmetric edema to the bilateral lower extremities. Lungs somewhat decreased bibasilarly, no signs of respiratory distress. She reports her LE edema has been going on for awhile. Work-up reviewed: Nonspecific leukocytosis at 11.5. No anemia. Mild hyponatremia at 134- no other significant electrolyte abnormalities. Hyperglycemia at 181. Renal fxn WNL. Trop negative, EKG without significant change from previous, sxs hx does not sound consistent with ACS. Given episodic with  description, low suspicion for PE. CXR: without acute abnormality, she does have stable borderline cardiomegaly and mild COPD changes. No pneumonia, effusion, pulmonary edema, or pneumothorax. Query COPD relation and relation to increased tobacco  use. Will provide rescue inhaler for PRN use. Discussed findings and plan of care with supervising physician Dr. Wilson Singer who personally evaluated patient- in agreement for inhaler, also recommends 5 days of 20 mg BID Lasix with Potassium supplementation as well given edema and rales on his exam with cardiology follow up, I am in agreement with this. Smoking cessation counseling performed by myself and RN. Patient appears hemodynamically stable, no acute distress, safe for discharge. I discussed results, treatment plan, need for cardiology follow-up, and return precautions with the patient and her daughter. Provided opportunity for questions, patient and her daughter confirmed understanding and are in agreement with plan.   Final Clinical Impressions(s) / ED Diagnoses   Final diagnoses:  Shortness of breath    ED Discharge Orders        Ordered    albuterol (PROVENTIL HFA;VENTOLIN HFA) 108 (90 Base) MCG/ACT inhaler  Every 6 hours PRN     12/06/17 1519    furosemide (LASIX) 20 MG tablet  2 times daily     12/06/17 1519    potassium chloride (K-DUR) 10 MEQ tablet  Daily     12/06/17 [address], [place] R, PA-C 12/06/17 1546    Virgel Manifold, MD 12/12/17 [phone]

## 2017-12-06 NOTE — ED Notes (Signed)
Patient ambulatory to bathroom with steady gait at this time 

## 2017-12-06 NOTE — ED Triage Notes (Signed)
Pt c/o SOB since yesterday. Checked own vital signs at home and family reports all were WNL. This episode of SOB today only last "about an hour". Has had some new swelling in ankles, seen by PCP with no results as to why.

## 2017-12-06 NOTE — ED Notes (Signed)
Patient verbalizes understanding of discharge instructions. Opportunity for questioning and answers were provided. Armband removed by staff, pt discharged from ED via wheelchair with daughter.  

## 2017-12-10 ENCOUNTER — Encounter (HOSPITAL_COMMUNITY): Payer: Self-pay | Admitting: Emergency Medicine

## 2017-12-10 ENCOUNTER — Emergency Department (HOSPITAL_COMMUNITY)
Admission: EM | Admit: 2017-12-10 | Discharge: 2017-12-10 | Disposition: A | Discharge status: Home / Self Care | Payer: Medicare HMO | Attending: Emergency Medicine | Admitting: Emergency Medicine

## 2017-12-10 ENCOUNTER — Emergency Department (HOSPITAL_COMMUNITY): Payer: Medicare HMO

## 2017-12-10 ENCOUNTER — Other Ambulatory Visit: Payer: Self-pay

## 2017-12-10 DIAGNOSIS — I5032 Chronic diastolic (congestive) heart failure: Secondary | ICD-10-CM | POA: Diagnosis not present

## 2017-12-10 DIAGNOSIS — I2510 Atherosclerotic heart disease of native coronary artery without angina pectoris: Secondary | ICD-10-CM | POA: Insufficient documentation

## 2017-12-10 DIAGNOSIS — I110 Hypertensive heart disease with heart failure: Secondary | ICD-10-CM | POA: Insufficient documentation

## 2017-12-10 DIAGNOSIS — F17210 Nicotine dependence, cigarettes, uncomplicated: Secondary | ICD-10-CM | POA: Diagnosis not present

## 2017-12-10 DIAGNOSIS — Z7982 Long term (current) use of aspirin: Secondary | ICD-10-CM | POA: Insufficient documentation

## 2017-12-10 DIAGNOSIS — Z79899 Other long term (current) drug therapy: Secondary | ICD-10-CM | POA: Diagnosis not present

## 2017-12-10 DIAGNOSIS — E785 Hyperlipidemia, unspecified: Secondary | ICD-10-CM | POA: Diagnosis not present

## 2017-12-10 DIAGNOSIS — J441 Chronic obstructive pulmonary disease with (acute) exacerbation: Secondary | ICD-10-CM

## 2017-12-10 DIAGNOSIS — R062 Wheezing: Secondary | ICD-10-CM | POA: Diagnosis not present

## 2017-12-10 DIAGNOSIS — R0602 Shortness of breath: Secondary | ICD-10-CM | POA: Diagnosis not present

## 2017-12-10 LAB — BASIC METABOLIC PANEL
Anion gap: 12 (ref 5–15)
BUN: 21 mg/dL (ref 8–23)
CHLORIDE: 96 mmol/L — AB (ref 98–111)
CO2: 23 mmol/L (ref 22–32)
CREATININE: 0.78 mg/dL (ref 0.44–1.00)
Calcium: 9.6 mg/dL (ref 8.9–10.3)
GFR calc Af Amer: >60 mL/min (ref >60)
GFR calc non Af Amer: >60 mL/min (ref >60)
Glucose, Bld: 172 mg/dL — ABNORMAL HIGH (ref 70–99)
Potassium: 4.1 mmol/L (ref 3.5–5.1)
SODIUM: 131 mmol/L — AB (ref 135–145)

## 2017-12-10 LAB — CBC
HCT: 43.5 % (ref 36.0–46.0)
Hemoglobin: 14.3 g/dL (ref 12.0–15.0)
MCH: 29.1 pg (ref 26.0–34.0)
MCHC: 32.9 g/dL (ref 30.0–36.0)
MCV: 88.4 fL (ref 78.0–100.0)
PLATELETS: 311 10*3/uL (ref 150–400)
RBC: 4.92 MIL/uL (ref 3.87–5.11)
RDW: 12.5 % (ref 11.5–15.5)
WBC: 10.2 10*3/uL (ref 4.0–10.5)

## 2017-12-10 LAB — I-STAT TROPONIN, ED: Troponin i, poc: 0 ng/mL (ref 0.00–0.08)

## 2017-12-10 LAB — D-DIMER, QUANTITATIVE: D-Dimer, Quant: 0.78 ug/mL-FEU — ABNORMAL HIGH (ref 0.00–0.50)

## 2017-12-10 LAB — BRAIN NATRIURETIC PEPTIDE: B Natriuretic Peptide: 76.8 pg/mL (ref 0.0–100.0)

## 2017-12-10 MED ORDER — LORAZEPAM 2 MG/ML IJ SOLN
0.5000 mg | Freq: Once | INTRAMUSCULAR | Status: AC
  Administered 2017-12-10: 0.5 mg via INTRAVENOUS
  Filled 2017-12-10: qty 1

## 2017-12-10 MED ORDER — IOPAMIDOL (ISOVUE-370) INJECTION 76%
INTRAVENOUS | Status: AC
  Filled 2017-12-10: qty 100

## 2017-12-10 MED ORDER — IOPAMIDOL (ISOVUE-370) INJECTION 76%
100.0000 mL | Freq: Once | INTRAVENOUS | Status: AC | PRN
  Administered 2017-12-10: 100 mL via INTRAVENOUS

## 2017-12-10 MED ORDER — ALBUTEROL SULFATE (2.5 MG/3ML) 0.083% IN NEBU
2.5000 mg | INHALATION_SOLUTION | Freq: Once | RESPIRATORY_TRACT | Status: AC
  Administered 2017-12-10: 2.5 mg via RESPIRATORY_TRACT
  Filled 2017-12-10: qty 3

## 2017-12-10 NOTE — ED Provider Notes (Signed)
[facility] EMERGENCY DEPARTMENT Provider Note   CSN: [id number] Arrival date & time: 12/10/17  1401     History   Chief Complaint Chief Complaint  Patient presents with  . Shortness of Breath    HPI Meredith Gutierrez is a 77 y.o. female.  Patient complains of some shortness of breath.  She has a history of COPD.  She was given 1 neb treatment for I saw the patient.  The history is provided by the patient. No language interpreter was used.  Shortness of Breath  This is a recurrent problem. The problem occurs intermittently.The current episode started 1 to 2 hours ago. The problem has been resolved. Associated symptoms include wheezing. Pertinent negatives include no fever, no headaches, no cough, no chest pain, no abdominal pain and no rash. It is unknown what precipitated the problem. Risk factors: History of COPD. She has tried inhaled steroids for the symptoms. The treatment provided mild relief. She has had prior hospitalizations. She has had prior ED visits. Associated medical issues include COPD.    Past Medical History:  Diagnosis Date  . Angina    never needed to take nitroglycerin.  . Anxiety   . Blood transfusion without reported diagnosis   . Cerebral atherosclerosis   . COPD (chronic obstructive pulmonary disease) ([place])    02-16-13-"pt. denies this"-shows on CXR of 2- 2013.  Marland Kitchen Coronary artery disease 2003   s/p CABG 2014  . Diastolic CHF, on admit treated. 08/10/2011  . Fibromuscular dysplasia of renal artery ([facility])    right  . Heart murmur   . History of kidney stones 02-16-13   "hx. of overgrowth of muscle-causing some stricture"renal artery stenosis-being followed [facility]  . Hyperlipidemia   . Hypertension   . Hyperthyroidism    [place]  . Melanoma ([place]) 2010   skin cancers, 1 melanoma removed back  . Osteoarthritis     Patient Active Problem List   Diagnosis Date Noted  . Esophageal dysphagia 09/07/2017  . Health maintenance examination  04/13/2017  . Decreased hearing, bilateral 04/13/2017  . Advanced care planning/counseling discussion 04/09/2017  . Vitamin D deficiency 02/14/2017  . Fibromuscular dysplasia of renal artery ([place])   . Anxiety 02/09/2017  . Chronic lower back pain 02/09/2017  . Dementia 02/09/2017  . History of melanoma 02/09/2017  . Hyperthyroidism   . History of left renal artery stenosis 02/14/2016  . Chronic diastolic heart failure ([place]) 08/10/2011  . Dyslipidemia, statin intol. 08/07/2011  . Sinus bradycardia, beta blocker intol. 08/07/2011  . Chest pain, negative MI, cath with atretic LIMA, and excellent flow down the native LAD,patent grafts otherwise 08/05/2011  . Essential hypertension 08/05/2011  . Tobacco abuse 08/05/2011  . CAD (coronary artery disease), CABG 08/24/2001, off pump, LIMA to LAD, SVG to 1st diagonal. Ligation of LAD artery aneurysm. 08/05/2011    Past Surgical History:  Procedure Laterality Date  . ABDOMINAL HYSTERECTOMY  1970   heavy bleeding  . APPENDECTOMY  1960s  . CARDIAC CATHETERIZATION  08/09/2011   no sign. coronary obstruction. LIMA is atretic w/excellent flow down the native LAD  . CARDIAC CATHETERIZATION  08/22/2001   no sign. coronary stenosis,  . CATARACT EXTRACTION, BILATERAL Bilateral   . CESAREAN SECTION    . CORONARY ARTERY BYPASS GRAFT  02-16-13   3'03-no bypasses -Had 2 aneurysm repairs(stents used)  . LAPAROTOMY N/A 02/20/2013   Procedure: EXPLORATORY LAPAROTOMY;  [name], MD  . LEFT HEART CATHETERIZATION WITH CORONARY ANGIOGRAM N/A 08/09/2011  Procedure: LEFT HEART CATHETERIZATION WITH CORONARY ANGIOGRAM;  Surgeon: Sanda Klein, MD;  Location: [facility] CATH LAB;  Service: Cardiovascular;  Laterality: N/A;  . NM MYOCAR PERF WALL MOTION  07/20/2010   normal  . SALPINGOOPHORECTOMY Bilateral 02/20/2013   benign seromucinous cystadenofibroma R ovary [name], MD)  . TOOTH EXTRACTION    . TOTAL HIP ARTHROPLASTY Left 2015     OB History   None       Home Medications    Prior to Admission medications   Medication Sig Start Date End Date Taking? Authorizing Provider  albuterol (PROVENTIL HFA;VENTOLIN HFA) 108 (90 Base) MCG/ACT inhaler Inhale 1-2 puffs into the lungs every 6 (six) hours as needed for wheezing or shortness of breath. 12/06/17  Yes Petrucelli, Samantha R, PA-C  aspirin EC 81 MG tablet Take 81 mg by mouth daily.   Yes [provider]  candesartan (ATACAND) 16 MG tablet Take 16 mg by mouth daily. 11/23/17  Yes [provider]  carboxymethylcellulose (REFRESH PLUS) 0.5 % SOLN Place 1 drop into both eyes 2 (two) times daily.   Yes [provider]  Cholecalciferol (VITAMIN D) 2000 units CAPS Take 1 capsule (2,000 Units total) by mouth daily. 04/13/17  Yes Ria Bush, MD  diltiazem (CARDIZEM CD) 240 MG 24 hr capsule TAKE 1 CAPSULE BY MOUTH DAILY 11/29/17  Yes Croitoru, Mihai, MD  donepezil (ARICEPT) 5 MG tablet TAKE ONE TABLET BY MOUTH AT BEDTIME 08/15/17  Yes Ria Bush, MD  furosemide (LASIX) 20 MG tablet Take 1 tablet (20 mg total) by mouth 2 (two) times daily. 12/06/17  Yes Petrucelli, Samantha R, PA-C  morphine (MS CONTIN) 15 MG 12 hr tablet Take 1 tablet by mouth 2 (two) times daily. 06/15/17  Yes [provider]  morphine (MSIR) 15 MG tablet Take 1 tablet (15 mg total) by mouth every 6 (six) hours as needed for moderate pain. 04/13/17  Yes Ria Bush, MD  oxymetazoline (AFRIN) 0.05 % nasal spray Place 2 sprays into both nostrils daily.   Yes [provider]  potassium chloride (K-DUR) 10 MEQ tablet Take 1 tablet (10 mEq total) by mouth daily. 12/06/17  Yes Petrucelli, Glynda Jaeger, PA-C    Family History Family History  Problem Relation Age of Onset  . Stroke Mother   . Hypotension Mother   . Dementia Mother 66  . Coronary artery disease Father   . Hypertension Father   . Breast cancer Maternal Aunt   . Hypotension Sister   . Hypertension Brother   .  Diabetes Brother   . Lung cancer Daughter   . Thyroid cancer Daughter     Social History Social History   Tobacco Use  . Smoking status: Light Tobacco Smoker    Packs/day: 0.10    Years: 20.00    Pack years: 2.00    Types: Cigarettes  . Smokeless tobacco: Never Used  . Tobacco comment: smoking cessation info given  Substance Use Topics  . Alcohol use: No  . Drug use: No     Allergies   Codeine; Penicillin g; Penicillins; Prednisolone acetate; Meloxicam; Nitroglycerin; Crestor [rosuvastatin calcium]; Lopressor [metoprolol tartrate]; Prednisone; Sulfamethoxazole; and Zetia [ezetimibe]   Review of Systems Review of Systems  Constitutional: Negative for appetite change, fatigue and fever.  HENT: Negative for congestion, ear discharge and sinus pressure.   Eyes: Negative for discharge.  Respiratory: Positive for shortness of breath and wheezing. Negative for cough.   Cardiovascular: Negative for chest pain.  Gastrointestinal: Negative for abdominal  pain and diarrhea.  Genitourinary: Negative for frequency and hematuria.  Musculoskeletal: Negative for back pain.  Skin: Negative for rash.  Neurological: Negative for seizures and headaches.  Psychiatric/Behavioral: Negative for hallucinations.     Physical Exam Updated Vital Signs BP 121/67   Pulse 68   Temp 98.6 F (37 C)   Resp (!) 21   SpO2 100%   Physical Exam  Constitutional: She is oriented to person, place, and time. She appears well-developed.  HENT:  Head: Normocephalic.  Eyes: Conjunctivae and EOM are normal. No scleral icterus.  Neck: Neck supple. No thyromegaly present.  Cardiovascular: Normal rate and regular rhythm. Exam reveals no gallop and no friction rub.  No murmur heard. Pulmonary/Chest: No stridor. She has no wheezes. She has no rales. She exhibits no tenderness.  Abdominal: She exhibits no distension. There is no tenderness. There is no rebound.  Musculoskeletal: Normal range of motion. She  exhibits no edema.  Lymphadenopathy:    She has no cervical adenopathy.  Neurological: She is oriented to person, place, and time. She exhibits normal muscle tone. Coordination normal.  Skin: No rash noted. No erythema.  Psychiatric: She has a normal mood and affect. Her behavior is normal.     ED Treatments / Results  Labs (all labs ordered are listed, but only abnormal results are displayed) Labs Reviewed  BASIC METABOLIC PANEL - Abnormal; Notable for the following components:      Result Value   Sodium 131 (*)    Chloride 96 (*)    Glucose, Bld 172 (*)    All other components within normal limits  D-DIMER, QUANTITATIVE (NOT AT [facility]) - Abnormal; Notable for the following components:   D-Dimer, Quant 0.78 (*)    All other components within normal limits  CBC  BRAIN NATRIURETIC PEPTIDE  I-STAT TROPONIN, ED    EKG None  Radiology Dg Chest 2 View  Result Date: 12/10/2017 CLINICAL DATA:  77 year old female with a 2 week history of shortness of breath EXAM: CHEST - 2 VIEW COMPARISON:  Prior chest x-ray 12/06/2017 FINDINGS: Patient is status post median sternotomy with evidence of prior multivessel CABG including LIMA bypass. Stable cardiomegaly with left atrial enlargement. Atherosclerotic calcifications again noted in the transverse aorta. Stable appearance of the lungs with chronic bronchitic changes and mild interstitial prominence. Pulmonary vascular congestion is similar compared to prior. No overt pulmonary edema, pleural effusion or pneumothorax. No focal airspace consolidation. No acute osseous abnormality. Degenerative changes again noted at the right shoulder with probable multiple intra-articular loose bodies. IMPRESSION: 1. Stable chest x-ray without evidence of acute cardiopulmonary process. 2. Cardiomegaly with left atrial enlargement and pulmonary vascular congestion but no overt edema. 3.  Aortic Atherosclerosis (ICD10-170.0) Electronically Signed   By: Jacqulynn Cadet  M.D.   On: 12/10/2017 14:49   Ct Angio Chest Pe W And/or Wo Contrast  Result Date: 12/10/2017 CLINICAL DATA:  Persistent shortness of breath for the past 4 days. EXAM: CT ANGIOGRAPHY CHEST WITH CONTRAST TECHNIQUE: Multidetector CT imaging of the chest was performed using the standard protocol during bolus administration of intravenous contrast. Multiplanar CT image reconstructions and MIPs were obtained to evaluate the vascular anatomy. CONTRAST:  157mL ISOVUE-370 IOPAMIDOL (ISOVUE-370) INJECTION 76% COMPARISON:  Chest x-ray from same day. FINDINGS: Cardiovascular: Satisfactory opacification of the pulmonary arteries to the segmental level. No evidence of acute pulmonary embolism. Thin linear filling defect in the distal right interlobar pulmonary artery may be related to old pulmonary embolism (series 9, image 93).  Normal heart size. Prior CABG. No pericardial effusion. Normal caliber thoracic aorta. No aortic dissection. Coronary, aortic arch, and branch vessel atherosclerotic vascular disease. Mediastinum/Nodes: No enlarged mediastinal, hilar, or axillary lymph nodes. Thyroid goiter with bilateral hypodense nodules measuring up to 2.0 cm. The trachea and esophagus demonstrate no significant findings. Lungs/Pleura: Mild diffuse peribronchial thickening. Minimal bilateral lower lobe dependent atelectasis. No focal consolidation, pleural effusion, or pneumothorax. Mild centrilobular emphysema. Scattered pulmonary nodules, the largest in the right lower lobe measuring 8 x 6 mm. Upper Abdomen: No acute abnormality. Musculoskeletal: No chest wall abnormality. No acute or significant osseous findings. Review of the MIP images confirms the above findings. IMPRESSION: 1. No evidence of pulmonary embolism. No acute intrathoracic process. 2. Mild diffuse peribronchial thickening.  Emphysema (ICD10-J43.9). 3. Scattered pulmonary nodules, the largest in the right lower lobe measuring 7 mm aggregate. Non-contrast chest CT  at 6-12 months is recommended. If the nodule is stable at time of repeat CT, then future CT at 18-24 months (from today's scan) is considered optional for low-risk patients, but is recommended for high-risk patients. This recommendation follows the consensus statement: Guidelines for Management of Incidental Pulmonary Nodules Detected on CT Images: From the Fleischner Society 2017; Radiology 2017; 284:228-243. 4. Thyroid goiter with bilateral hypodense nodules measuring up to 2.0 cm. Recommend non-emergent thyroid ultrasound for further evaluation. This follows ACR consensus guidelines: Managing Incidental Thyroid Nodules Detected on Imaging: White Paper of the ACR Incidental Thyroid Findings Committee. J Am Coll Radiol 2015; 12:143-150. 5.  Aortic atherosclerosis (ICD10-I70.0). Electronically Signed   By: Titus Dubin M.D.   On: 12/10/2017 18:46    Procedures Procedures (including critical care time)  Medications Ordered in ED Medications  iopamidol (ISOVUE-370) 76 % injection (has no administration in time range)  albuterol (PROVENTIL) (2.5 MG/3ML) 0.083% nebulizer solution 2.5 mg (2.5 mg Nebulization Given 12/10/17 1538)  LORazepam (ATIVAN) injection 0.5 mg (0.5 mg Intravenous Given 12/10/17 1718)  iopamidol (ISOVUE-370) 76 % injection 100 mL (100 mLs Intravenous Contrast Given 12/10/17 1729)     Initial Impression / Assessment and Plan / ED Course  I have reviewed the triage vital signs and the nursing notes.  Pertinent labs & imaging results that were available during my care of the patient were reviewed by me and considered in my medical decision making (see chart for details).     Patient with exacerbation of COPD.  Improved with neb treatments.  She will continue her inhaler at home and follow-up with her PCP Final Clinical Impressions(s) / ED Diagnoses   Final diagnoses:  COPD exacerbation [facility])    ED Discharge Orders    None       Milton Ferguson, MD 12/10/17 [phone]

## 2017-12-10 NOTE — ED Notes (Signed)
Placed ice pack on IV removal site.

## 2017-12-10 NOTE — Progress Notes (Addendum)
Patient had CTA Chest. Patient experienced approximately 10cc extravasation at end of study. IV was in right AC. Dr. Richardo Priest radiologist saw the patient in CT and gave care instructions to patient and daughter. IV was removed in CT and arm was elevated. Patient's nurse Domingo Mend was informed by me of extravasation when patient wa returned to ED

## 2017-12-10 NOTE — ED Triage Notes (Signed)
Patient to ED c/o persistent SOB x 4 days - seen on the 25th here and given inhaler and Lasix for CHF and COPD exacerbation. Patient's daughter reports the Lasix has helped, but patient is still SOB and unable to wait until Monday when she follows up with her cardiologist. Respirations equal, diminished lower lobes, but clear. Pt endorses dry cough, denies CP or fevers.

## 2017-12-10 NOTE — Discharge Instructions (Signed)
Continue with your present medicines and follow-up with your family doctor next week return if any problems

## 2017-12-10 NOTE — ED Notes (Signed)
Assisted pt to restroom and returned safely to room

## 2017-12-10 NOTE — ED Notes (Signed)
Pt alert and oriented in NAD. Pt verbalized understanding of discharge instructions. 

## 2017-12-10 NOTE — Progress Notes (Signed)
This patient has received 10 ml's of IV Isovue 370 contrast extravasation into right arm during a CTA of the chest.  The exam was performed on 12/10/2017.  Site / affected area assessed by myself. Mild tenderness over lateral elbow at site of extravasation. No discoloration. Motor and sensation intact. Right radial pulse 2+. Arm was elevated. Care instructions given to the patient and her daughter.

## 2017-12-11 NOTE — Progress Notes (Signed)
Cardiology Office Note:    Date:  12/12/2017   ID:  Meredith Gutierrez, DOB 1940-11-01, MRN [id number]  PCP:  Ria Bush, MD  Cardiologist:  Sanda Klein, MD   Referring MD: Ria Bush, MD   Chief Complaint  Patient presents with  . Hospitalization Follow-up    for SOB, pt denied chest pain    History of Present Illness:    Meredith Gutierrez is a 77 y.o. female with a hx of CAD who is s/p CABG (off-pump, LIMA-LAD, SVG-D1, ligation of LAD aneurysm), chronic diastolic heart failure, HTN, and beta blocker intolerance due to sinus bradycardia, HLD, and tobacco abuse. Her last cath was 08/08/17 without significant coronary obstruction. Last echo 09/01/17 with normal LVEF and grade 1 DD. She was last seen in clinic on 09/29/17 by Jory Sims, NP, who mentioned the patient has had no clinical heart failure since 2013. She is intolerant to numerous attempts at lipid lowering therapies, including statins and zetia. She has dementia and is cared for by her daughter. In 08/2017 during a clinic visit, she complained of lower extremity edema. Compression stockings were recommended. She has recently been seen in the ED for shortness of breath. On 12/06/17, she presented with SOB after smoking a cigarette. It was noted that she has increased her smoking amount because she forgets the last time she smoked a cigarette. She was evaluated and discharged on 5 day sof 20 mg BID lasix and potassium supplementation. She was seen again in the ED on 12/10/17 for shortness of breath, again after smoking cigarettes. She also reported lower extremity swelling in her ankles. Neb treatments in the ED improved her SOB and she was discharged with instructions for inhaler treatments at home and to follow up with her PCP.  Chest x-ray and BNP normal. No signs of heart failure. D-dimer was elevated and CT chest without PE, but lung nodules (recommend follow up CT chest in 6-12 months). I asked her to follow up nodules with  her PCP.   Patient presents today for hospital follow-up.  She is here with her daughter who is her primary caregiver.  She lives in a basement apartment at her daughter's house.  She has been a smoker for many years but generally only smokes 3 cigarettes/day.  Over the past 3 to 4 weeks, the daughter reports that she forgets when she smokes and has been smoking up to 1 pack/day. She also complains of lower extremity swelling and pain for the past 2 to 3 months.  During her first ER visit she was given Lasix 20 mg twice daily with potassium supplementation.  Patient and her daughter states that this Lasix regimen helped her lower extremity edema.  She has not gained any weight at home.  She denies chest pain, palpitation, and orthopnea.  She is breathing at her baseline today in the office.   Past Medical History:  Diagnosis Date  . Angina    never needed to take nitroglycerin.  . Anxiety   . Blood transfusion without reported diagnosis   . Cerebral atherosclerosis   . COPD (chronic obstructive pulmonary disease) ([place])    02-16-13-"pt. denies this"-shows on CXR of 2- 2013.  Marland Kitchen Coronary artery disease 2003   s/p CABG 2014  . Diastolic CHF, on admit treated. 08/10/2011  . Fibromuscular dysplasia of renal artery ([facility])    right  . Heart murmur   . History of kidney stones 02-16-13   "hx. of overgrowth of muscle-causing some stricture"renal artery  stenosis-being followed [place]  . Hyperlipidemia   . Hypertension   . Hyperthyroidism    [place]  . Melanoma ([place]) 2010   skin cancers, 1 melanoma removed back  . Osteoarthritis     Past Surgical History:  Procedure Laterality Date  . ABDOMINAL HYSTERECTOMY  1970   heavy bleeding  . APPENDECTOMY  1960s  . CARDIAC CATHETERIZATION  08/09/2011   no sign. coronary obstruction. LIMA is atretic w/excellent flow down the native LAD  . CARDIAC CATHETERIZATION  08/22/2001   no sign. coronary stenosis,  . CATARACT EXTRACTION, BILATERAL Bilateral   . CESAREAN  SECTION    . CORONARY ARTERY BYPASS GRAFT  02-16-13   3'03-no bypasses -Had 2 aneurysm repairs(stents used)  . LAPAROTOMY N/A 02/20/2013   Procedure: EXPLORATORY LAPAROTOMY;  [name], MD  . LEFT HEART CATHETERIZATION WITH CORONARY ANGIOGRAM N/A 08/09/2011   Procedure: LEFT HEART CATHETERIZATION WITH CORONARY ANGIOGRAM;  Surgeon: Sanda Klein, MD;  Location: [facility];  Service: Cardiovascular;  Laterality: N/A;  . NM MYOCAR PERF WALL MOTION  07/20/2010   normal  . SALPINGOOPHORECTOMY Bilateral 02/20/2013   benign seromucinous cystadenofibroma R ovary [name], MD)  . TOOTH EXTRACTION    . TOTAL HIP ARTHROPLASTY Left 2015    Current Medications: Current Meds  Medication Sig  . albuterol (PROVENTIL HFA;VENTOLIN HFA) 108 (90 Base) MCG/ACT inhaler Inhale 1-2 puffs into the lungs every 6 (six) hours as needed for wheezing or shortness of breath.  Marland Kitchen aspirin EC 81 MG tablet Take 81 mg by mouth daily.  . candesartan (ATACAND) 16 MG tablet Take 16 mg by mouth daily.  . carboxymethylcellulose (REFRESH PLUS) 0.5 % SOLN Place 1 drop into both eyes 2 (two) times daily.  . Cholecalciferol (VITAMIN D) 2000 units CAPS Take 1 capsule (2,000 Units total) by mouth daily.  Marland Kitchen diltiazem (CARDIZEM CD) 240 MG 24 hr capsule TAKE 1 CAPSULE BY MOUTH DAILY  . donepezil (ARICEPT) 5 MG tablet TAKE ONE TABLET BY MOUTH AT BEDTIME  . furosemide (LASIX) 20 MG tablet Take 1 tablet (20 mg total) by mouth daily as needed. For ankle swelling  . morphine (MS CONTIN) 15 MG 12 hr tablet Take 1 tablet by mouth 2 (two) times daily.  Marland Kitchen morphine (MSIR) 15 MG tablet Take 1 tablet (15 mg total) by mouth every 6 (six) hours as needed for moderate pain.  Marland Kitchen oxymetazoline (AFRIN) 0.05 % nasal spray Place 2 sprays into both nostrils daily.  . potassium chloride (K-DUR) 10 MEQ tablet Take 1 tablet (10 mEq total) by mouth daily.  . [DISCONTINUED] furosemide (LASIX) 20 MG tablet Take 1 tablet (20 mg total) by mouth 2 (two) times  daily.     Allergies:   Codeine; Penicillin g; Penicillins; Prednisolone acetate; Meloxicam; Nitroglycerin; Crestor [rosuvastatin calcium]; Lopressor [metoprolol tartrate]; Prednisone; Sulfamethoxazole; and Zetia [ezetimibe]   Social History   Socioeconomic History  . Marital status: Widowed    Spouse name: Not on file  . Number of children: Not on file  . Years of education: Not on file  . Highest education level: Not on file  Occupational History  . Not on file  Social Needs  . Financial resource strain: Not on file  . Food insecurity:    Worry: Not on file    Inability: Not on file  . Transportation needs:    Medical: Not on file    Non-medical: Not on file  Tobacco Use  . Smoking status: Light Tobacco Smoker    Packs/day:  0.10    Years: 20.00    Pack years: 2.00    Types: Cigarettes  . Smokeless tobacco: Never Used  . Tobacco comment: smoking cessation info given  Substance and Sexual Activity  . Alcohol use: No  . Drug use: No  . Sexual activity: Never  Lifestyle  . Physical activity:    Days per week: Not on file    Minutes per session: Not on file  . Stress: Not on file  Relationships  . Social connections:    Talks on phone: Not on file    Gets together: Not on file    Attends religious service: Not on file    Active member of club or organization: Not on file    Attends meetings of clubs or organizations: Not on file    Relationship status: Not on file  Other Topics Concern  . Not on file  Social History Narrative   Widow   Lives with daughter and daughter's GF   Edu: HS   Occ: retired, worked at [employer]   Activity: no regular exercise   Diet: good water      Family History: The patient's family history includes Breast cancer in her maternal aunt; Coronary artery disease in her father; Dementia (age of onset: 64) in her mother; Diabetes in her brother; Hypertension in her brother and father; Hypotension in her mother and sister; Lung cancer in her  daughter; Stroke in her mother; Thyroid cancer in her daughter.  ROS:   Please see the history of present illness.     All other systems reviewed and are negative.  EKGs/Labs/Other Studies Reviewed:    The following studies were reviewed today:   Echo 09/01/17 Study Conclusions  - Left ventricle: The cavity size was normal. There was mild   concentric hypertrophy. Systolic function was normal. The   estimated ejection fraction was in the range of 55% to 60%. Wall   motion was normal; there were no regional wall motion   abnormalities. Doppler parameters are consistent with abnormal   left ventricular relaxation (grade 1 diastolic dysfunction). - Aortic valve: Trileaflet; moderately thickened, severely   calcified leaflets. Transvalvular velocity was within the normal   range. There was no stenosis.  EKG:  EKG is not ordered today.   Recent Labs: 07/14/2017: TSH 0.72 12/10/2017: B Natriuretic Peptide 76.8; BUN 21; Creatinine, Ser 0.78; Hemoglobin 14.3; Platelets 311; Potassium 4.1; Sodium 131  Recent Lipid Panel    Component Value Date/Time   CHOL 216 (H) 04/06/2017 1113   TRIG 278.0 (H) 04/06/2017 1113   HDL 35.90 (L) 04/06/2017 1113   CHOLHDL 6 04/06/2017 1113   VLDL 55.6 (H) 04/06/2017 1113   LDLCALC 123 02/06/2016   LDLDIRECT 137.0 04/06/2017 1113    Physical Exam:    VS:  BP 135/70   Pulse 72   Ht 5\' 2"  (1.575 m)   Wt 139 lb 3.2 oz (63.1 kg)   BMI 25.46 kg/m     Wt Readings from Last 3 Encounters:  12/12/17 139 lb 3.2 oz (63.1 kg)  12/10/17 140 lb (63.5 kg)  12/06/17 140 lb (63.5 kg)     GEN:  Well nourished, well developed in no acute distress, repeats herself  HEENT: Normal NECK: No JVD; No carotid bruits CARDIAC: RRR, no murmurs, rubs, gallops RESPIRATORY:  Clear to auscultation without rales, wheezing or rhonchi  ABDOMEN: Soft, non-tender, non-distended MUSCULOSKELETAL:  Trace nonpitting edema; No deformity, 2+ pedal pulses  SKIN: Warm and  dry  NEUROLOGIC:  Alert and oriented x 3 PSYCHIATRIC:  Normal affect   ASSESSMENT:    1. Shortness of breath   2. Chronic diastolic heart failure ([place])   3. Swelling of lower extremity   4. Coronary artery disease involving native coronary artery of native heart without angina pectoris   5. Essential hypertension   6. Sinus bradycardia, beta blocker intol.   7. Tobacco abuse    PLAN:    In order of problems listed above:  Shortness of breath Chest x-ray and B and he 2 days ago were normal.  Clinically she does not appear volume overloaded.  I have a low suspicion for heart failure as an etiology for her shortness of breath.  I am more concerned about her increased smoking, deconditioning, and obesity.  Increasing a walking program right now tough given her advanced dementia.  I suggested a pulse ox.  She has periods of shortness of breath the daughter may choose to take her oxygen level to make sure that she is 88% or above.  CTA chest negative for PE.  I would like for her to see her PCP for a referral to pulmonology.  The daughter states that the nebulizer treatment has helped her breathing.  Chronic diastolic heart failure ([place])  As above.  She appears euvolemic to me today.  On 12/10/2017, her chest x-ray was without pleural effusion.  In addition her BNP was within normal limits.  Last echo in March 2018 with normal LVEF. I will hold off on repeating an echocardiogram at this time.  Swelling of lower extremity VAS [place] LOWER EXTREMITY VENOUS (DVT) So her weight has been stable at 140 to 142 pounds.  She does not like compression socks.  Given her positive d-dimer on 12/10/2017, I will order a lower extremity venous Doppler to rule out DVT.  I will also order 20 mg of Lasix daily to be used as needed for lower extremity swelling.  Will repeat at [facility] today and again in 2 weeks if she consistently takes Lasix every day.  Coronary artery disease involving native coronary artery of native  heart without angina pectoris  Stable. No anginal symptoms. Continue ASA.  Essential hypertension  No med changes, continue home regimen.   Sinus bradycardia, beta blocker intol HR 72 today. No medications changes.   Tobacco abuse She has recently increased her smoking from 3 cigarettes per day to 1 ppd. Daughter has asked for a nicotine patch. I ordered a 14 mg patch with instructions that she can cut in half as a wean. She will start regulating how many cigarettes she has access to every day.   Follow up with Dr. Sallyanne Kuster in 3 months, sooner if needed.    Medication Adjustments/Labs and Tests Ordered: Current medicines are reviewed at length with the patient today.  Concerns regarding medicines are outlined above.  Orders Placed This Encounter  Procedures  . Basic Metabolic Panel (BMET)   Meds ordered this encounter  Medications  . furosemide (LASIX) 20 MG tablet    Sig: Take 1 tablet (20 mg total) by mouth daily as needed. For ankle swelling    Dispense:  10 tablet    Refill:  0    Signed, Ledora Bottcher, [place]  12/12/2017 9:55 AM    [facility]

## 2017-12-11 NOTE — Progress Notes (Signed)
12/11/2017 16:10 Called patient home phone (([phone])) for 24 hour extravasation f/u. There was no answer. Called cell # (([phone]) and left message to have patient call CT for f/u.

## 2017-12-12 ENCOUNTER — Ambulatory Visit: Payer: Self-pay | Admitting: *Deleted

## 2017-12-12 ENCOUNTER — Ambulatory Visit: Payer: Medicare HMO | Admitting: Physician Assistant

## 2017-12-12 ENCOUNTER — Encounter: Payer: Self-pay | Admitting: Physician Assistant

## 2017-12-12 ENCOUNTER — Other Ambulatory Visit: Payer: Self-pay | Admitting: Physician Assistant

## 2017-12-12 VITALS — BP 135/70 | HR 72 | Ht 62.0 in | Wt 139.2 lb

## 2017-12-12 DIAGNOSIS — Z720 Tobacco use: Secondary | ICD-10-CM

## 2017-12-12 DIAGNOSIS — I5032 Chronic diastolic (congestive) heart failure: Secondary | ICD-10-CM

## 2017-12-12 DIAGNOSIS — I10 Essential (primary) hypertension: Secondary | ICD-10-CM

## 2017-12-12 DIAGNOSIS — I2510 Atherosclerotic heart disease of native coronary artery without angina pectoris: Secondary | ICD-10-CM

## 2017-12-12 DIAGNOSIS — R001 Bradycardia, unspecified: Secondary | ICD-10-CM

## 2017-12-12 DIAGNOSIS — R0602 Shortness of breath: Secondary | ICD-10-CM

## 2017-12-12 DIAGNOSIS — M7989 Other specified soft tissue disorders: Secondary | ICD-10-CM

## 2017-12-12 LAB — BASIC METABOLIC PANEL
BUN / CREAT RATIO: 33 — AB (ref 12–28)
BUN: 21 mg/dL (ref 8–27)
CO2: 24 mmol/L (ref 20–29)
CREATININE: 0.64 mg/dL (ref 0.57–1.00)
Calcium: 10.1 mg/dL (ref 8.7–10.3)
Chloride: 100 mmol/L (ref 96–106)
GFR calc Af Amer: 99 mL/min/{1.73_m2} (ref >59)
GFR, EST NON AFRICAN AMERICAN: 86 mL/min/{1.73_m2} (ref >59)
Glucose: 166 mg/dL — ABNORMAL HIGH (ref 65–99)
Potassium: 5.7 mmol/L — ABNORMAL HIGH (ref 3.5–5.2)
SODIUM: 136 mmol/L (ref 134–144)

## 2017-12-12 MED ORDER — FUROSEMIDE 20 MG PO TABS: 20.0000 mg | ORAL_TABLET | Freq: Every day | ORAL | 0 refills | Status: DC | PRN

## 2017-12-12 MED ORDER — NICOTINE 14 MG/24HR TD PT24: 14.0000 mg | MEDICATED_PATCH | Freq: Every day | TRANSDERMAL | 1 refills | Status: DC

## 2017-12-12 NOTE — Telephone Encounter (Signed)
Pt seen in ED 12/10/17 ,COPD exacerbation. Nebs tx given, effective. Labs with ED visit revealed , D-Dimer 0.78, dopplers done right leg as swelling present; negative. Pt saw cardiologist this AM. [place] leg scheduled for tomorrow afternoon. Daughter calling this am to schedule F/U appt with Dr. Danise Mina, agent referred to NT as pt remains SOB. Daughter states no worse, no CP, cardiologist saw this am. States pt had smoked a cigarette this am, reports pt smokes up to a pack a day. ED f/u appt made for tomorrow by agent prior to NT. Care advise given, directed to ED if sob worsens.  Reason for Disposition . [1] MODERATE longstanding difficulty breathing (e.g., speaks in phrases, SOB even at rest, pulse 100-120) AND [2] SAME as normal  Answer Assessment - Initial Assessment Questions 1. RESPIRATORY STATUS: "Describe your breathing?" (e.g., wheezing, shortness of breath, unable to speak, severe coughing)      SOB after "Smoking cigarette" 2. ONSET: "When did this breathing problem begin?"     H/O COPD 3. PATTERN "Does the difficult breathing come and go, or has it been constant since it started?"      Same, seen in ED 12/10/17 4. SEVERITY: "How bad is your breathing?" (e.g., mild, moderate, severe)    - MILD: No SOB at rest, mild SOB with walking, speaks normally in sentences, can lay down, no retractions, pulse < 100.    - MODERATE: SOB at rest, SOB with minimal exertion and prefers to sit, cannot lie down flat, speaks in phrases, mild retractions, audible wheezing, pulse 100-120.    - SEVERE: Very SOB at rest, speaks in single words, struggling to breathe, sitting hunched forward, retractions, pulse > 120      Mild-moderate as before 5. RECURRENT SYMPTOM: "Have you had difficulty breathing before?" If so, ask: "When was the last time?" and "What happened that time?"      Yes h/o COPD, smoker 6. CARDIAC HISTORY: "Do you have any history of heart disease?" (e.g., hearttack, angina, bypass surgery,  angioplasty)       7. LUNG HISTORY: "Do you have any history of lung disease?"  (e.g., pulmonary embolus, asthma, emphysema)     COPD 8. CAUSE: "What do you think is causing the breathing problem?"      COPD , smoking 9. OTHER SYMPTOMS: "Do you have any other symptoms? (e.g., dizziness, runny nose, cough, chest pain, fever)     no  Protocols used: BREATHING DIFFICULTY-A-AH

## 2017-12-12 NOTE — Patient Instructions (Addendum)
Medication Instructions:  START Lasix 20mg  Take on a as needed basis for ankle swelling  Labwork: Your physician recommends that you return for lab work in: TODAY-BMET; REPEAT BMET IN 2 WEEKS IF YOU START TAKING THE LASIX DAILY.  Testing/Procedures: Your physician has requested that you have a lower extremity venous duplex. This test is an ultrasound of the veins in the legs. It looks at venous blood flow that carries blood from the heart to the legs. Allow one hour for a Lower Venous exam.There are no restrictions or special instructions.  Follow-Up: Your physician recommends that you schedule a follow-up appointment in: 3 months with Dr Sallyanne Kuster.  Any Other Special Instructions Will Be Listed Below (If Applicable). If you need a refill on your cardiac medications before your next appointment, please call your pharmacy.

## 2017-12-12 NOTE — Addendum Note (Signed)
Addended by: Ulice Brilliant T on: 12/12/2017 10:01 AM   Modules accepted: Orders

## 2017-12-12 NOTE — Progress Notes (Signed)
Thank you, Angie MCr 

## 2017-12-13 ENCOUNTER — Other Ambulatory Visit: Payer: Self-pay

## 2017-12-13 ENCOUNTER — Encounter: Payer: Self-pay | Admitting: Family Medicine

## 2017-12-13 ENCOUNTER — Ambulatory Visit (HOSPITAL_COMMUNITY)
Admission: RE | Admit: 2017-12-13 | Discharge: 2017-12-13 | Disposition: A | Discharge status: Home / Self Care | Payer: Medicare HMO | Source: Ambulatory Visit | Attending: Cardiovascular Disease | Admitting: Cardiovascular Disease

## 2017-12-13 ENCOUNTER — Ambulatory Visit (INDEPENDENT_AMBULATORY_CARE_PROVIDER_SITE_OTHER): Payer: Medicare HMO | Admitting: Family Medicine

## 2017-12-13 VITALS — BP 120/68 | HR 41 | Temp 98.4°F | Ht 62.0 in | Wt 139.0 lb

## 2017-12-13 DIAGNOSIS — M7989 Other specified soft tissue disorders: Secondary | ICD-10-CM | POA: Diagnosis not present

## 2017-12-13 DIAGNOSIS — R0602 Shortness of breath: Secondary | ICD-10-CM | POA: Diagnosis not present

## 2017-12-13 DIAGNOSIS — I5032 Chronic diastolic (congestive) heart failure: Secondary | ICD-10-CM | POA: Diagnosis not present

## 2017-12-13 DIAGNOSIS — Z720 Tobacco use: Secondary | ICD-10-CM | POA: Diagnosis not present

## 2017-12-13 DIAGNOSIS — R001 Bradycardia, unspecified: Secondary | ICD-10-CM

## 2017-12-13 DIAGNOSIS — R918 Other nonspecific abnormal finding of lung field: Secondary | ICD-10-CM | POA: Diagnosis not present

## 2017-12-13 DIAGNOSIS — M79604 Pain in right leg: Secondary | ICD-10-CM | POA: Diagnosis not present

## 2017-12-13 DIAGNOSIS — E875 Hyperkalemia: Secondary | ICD-10-CM

## 2017-12-13 DIAGNOSIS — J449 Chronic obstructive pulmonary disease, unspecified: Secondary | ICD-10-CM

## 2017-12-13 DIAGNOSIS — Z79899 Other long term (current) drug therapy: Secondary | ICD-10-CM

## 2017-12-13 MED ORDER — IPRATROPIUM-ALBUTEROL 0.5-2.5 (3) MG/3ML IN SOLN
3.0000 mL | Freq: Once | RESPIRATORY_TRACT | Status: AC
  Administered 2017-12-13: 3 mL via RESPIRATORY_TRACT

## 2017-12-13 MED ORDER — ALBUTEROL SULFATE (2.5 MG/3ML) 0.083% IN NEBU: 2.5000 mg | INHALATION_SOLUTION | Freq: Four times a day (QID) | RESPIRATORY_TRACT | 1 refills | Status: DC | PRN

## 2017-12-13 NOTE — Progress Notes (Signed)
BP 120/68 (BP Location: Left Arm, Patient Position: Sitting, Cuff Size: Normal)   Pulse (!) 41   Temp 98.4 F (36.9 C) (Oral)   Ht 5\' 2"  (1.575 m)   Wt 139 lb (63 kg)   SpO2 99%   BMI 25.42 kg/m    CC: ER f/u visit Subjective:    Patient ID: Meredith Gutierrez, female    DOB: 1940/08/05, 77 y.o.   MRN: [id number]  HPI: Meredith Gutierrez is a 77 y.o. female presenting on 12/13/2017 for Hospitalization Follow-up (Admitted to [facility] ED on 12/10/17, primary dx COPD exacerbation. Pt accompanied by her daughter, [name].)   Here with daughter who is primary caregiver.  Hasn't smoked cigarette in 2 days! Using nicotine patches (14 mcg).   Recent ER visit x2 for acute progressive dyspnea in setting of increasing smoking. Positive D dimer, however CTA negative for PE. CXR ok, BNP normal. Nebulizer treatment was helpful. She responds much better to nebs than to inhaler.  Saw cardiology in follow up yesterday, note reviewed. Not thought CHF related but rather possible COPD. Echo 08/2016 - normal LVEF. Pending LE doppler to r/o DVT - had this done today, daughter states preliminarily no clot. Placed on lasix 20mg  daily PRN. Recheck K 5.7 - planned rpt BMP next week at cardiology office.   Needed ativan to be able to tolerate CT scan.   Relevant past medical, surgical, family and social history reviewed and updated as indicated. Interim medical history since our last visit reviewed. Allergies and medications reviewed and updated. Outpatient Medications Prior to Visit  Medication Sig Dispense Refill  . albuterol (PROVENTIL HFA;VENTOLIN HFA) 108 (90 Base) MCG/ACT inhaler Inhale 1-2 puffs into the lungs every 6 (six) hours as needed for wheezing or shortness of breath. 1 Inhaler 0  . aspirin EC 81 MG tablet Take 81 mg by mouth daily.    . candesartan (ATACAND) 16 MG tablet Take 16 mg by mouth daily.    . carboxymethylcellulose (REFRESH PLUS) 0.5 % SOLN Place 1 drop into both eyes 2 (two) times daily.    .  Cholecalciferol (VITAMIN D) 2000 units CAPS Take 1 capsule (2,000 Units total) by mouth daily. 30 capsule   . diltiazem (CARDIZEM CD) 240 MG 24 hr capsule TAKE 1 CAPSULE BY MOUTH DAILY 90 capsule 3  . donepezil (ARICEPT) 5 MG tablet TAKE ONE TABLET BY MOUTH AT BEDTIME 30 tablet 3  . furosemide (LASIX) 20 MG tablet Take 1 tablet (20 mg total) by mouth daily as needed. For ankle swelling 10 tablet 0  . morphine (MS CONTIN) 15 MG 12 hr tablet Take 1 tablet by mouth 2 (two) times daily.    Marland Kitchen morphine (MSIR) 15 MG tablet Take 1 tablet (15 mg total) by mouth every 6 (six) hours as needed for moderate pain.  0  . nicotine (NICODERM CQ - DOSED IN MG/24 HOURS) 14 mg/24hr patch Place 1 patch (14 mg total) onto the skin daily. 28 patch 1  . oxymetazoline (AFRIN) 0.05 % nasal spray Place 2 sprays into both nostrils daily.    . potassium chloride (K-DUR) 10 MEQ tablet Take 1 tablet (10 mEq total) by mouth daily. 10 tablet 0   No facility-administered medications prior to visit.      Per HPI unless specifically indicated in ROS section below Review of Systems     Objective:    BP 120/68 (BP Location: Left Arm, Patient Position: Sitting, Cuff Size: Normal)   Pulse (!) 41  Temp 98.4 F (36.9 C) (Oral)   Ht 5\' 2"  (1.575 m)   Wt 139 lb (63 kg)   SpO2 99%   BMI 25.42 kg/m   Wt Readings from Last 3 Encounters:  12/13/17 139 lb (63 kg)  12/12/17 139 lb 3.2 oz (63.1 kg)  12/10/17 140 lb (63.5 kg)    Physical Exam  Constitutional: She appears well-developed and well-nourished. No distress.  HENT:  Mouth/Throat: Oropharynx is clear and moist. No oropharyngeal exudate.  Cardiovascular: Normal rate, regular rhythm and normal heart sounds.  No murmur heard. Pulmonary/Chest: Effort normal and breath sounds normal. She has no wheezes. She has no rales.  Musculoskeletal: She exhibits no edema.  Psychiatric: She has a normal mood and affect.  Nursing note and vitals reviewed.  After alb/atrovent neb -  improved air flow, subjective improvement in dyspnea  Dg Chest 2 View Result Date: 12/10/2017 CLINICAL DATA:  77 year old female with a 2 week history of shortness of breath EXAM: CHEST - 2 VIEW COMPARISON:  Prior chest x-ray 12/06/2017 FINDINGS: Patient is status post median sternotomy with evidence of prior multivessel CABG including LIMA bypass. Stable cardiomegaly with left atrial enlargement. Atherosclerotic calcifications again noted in the transverse aorta. Stable appearance of the lungs with chronic bronchitic changes and mild interstitial prominence. Pulmonary vascular congestion is similar compared to prior. No overt pulmonary edema, pleural effusion or pneumothorax. No focal airspace consolidation. No acute osseous abnormality. Degenerative changes again noted at the right shoulder with probable multiple intra-articular loose bodies. IMPRESSION: 1. Stable chest x-ray without evidence of acute cardiopulmonary process. 2. Cardiomegaly with left atrial enlargement and pulmonary vascular congestion but no overt edema. 3.  Aortic Atherosclerosis (ICD10-170.0) Electronically Signed   By: Jacqulynn Cadet M.D.   On: 12/10/2017 14:49   Ct Angio Chest Pe W And/or Wo Contrast Result Date: 12/10/2017 CLINICAL DATA:  Persistent shortness of breath for the past 4 days. EXAM: CT ANGIOGRAPHY CHEST WITH CONTRAST TECHNIQUE: Multidetector CT imaging of the chest was performed using the standard protocol during bolus administration of intravenous contrast. Multiplanar CT image reconstructions and MIPs were obtained to evaluate the vascular anatomy. CONTRAST:  159mL ISOVUE-370 IOPAMIDOL (ISOVUE-370) INJECTION 76% COMPARISON:  Chest x-ray from same day. FINDINGS: Cardiovascular: Satisfactory opacification of the pulmonary arteries to the segmental level. No evidence of acute pulmonary embolism. Thin linear filling defect in the distal right interlobar pulmonary artery may be related to old pulmonary embolism (series  9, image 93). Normal heart size. Prior CABG. No pericardial effusion. Normal caliber thoracic aorta. No aortic dissection. Coronary, aortic arch, and branch vessel atherosclerotic vascular disease. Mediastinum/Nodes: No enlarged mediastinal, hilar, or axillary lymph nodes. Thyroid goiter with bilateral hypodense nodules measuring up to 2.0 cm. The trachea and esophagus demonstrate no significant findings. Lungs/Pleura: Mild diffuse peribronchial thickening. Minimal bilateral lower lobe dependent atelectasis. No focal consolidation, pleural effusion, or pneumothorax. Mild centrilobular emphysema. Scattered pulmonary nodules, the largest in the right lower lobe measuring 8 x 6 mm. Upper Abdomen: No acute abnormality. Musculoskeletal: No chest wall abnormality. No acute or significant osseous findings. Review of the MIP images confirms the above findings. IMPRESSION: 1. No evidence of pulmonary embolism. No acute intrathoracic process. 2. Mild diffuse peribronchial thickening.  Emphysema (ICD10-J43.9). 3. Scattered pulmonary nodules, the largest in the right lower lobe measuring 7 mm aggregate. Non-contrast chest CT at 6-12 months is recommended. If the nodule is stable at time of repeat CT, then future CT at 18-24 months (from today's scan) is considered optional  for low-risk patients, but is recommended for high-risk patients. This recommendation follows the consensus statement: Guidelines for Management of Incidental Pulmonary Nodules Detected on CT Images: From the Fleischner Society 2017; Radiology 2017; 284:228-243. 4. Thyroid goiter with bilateral hypodense nodules measuring up to 2.0 cm. Recommend non-emergent thyroid ultrasound for further evaluation. This follows ACR consensus guidelines: Managing Incidental Thyroid Nodules Detected on Imaging: White Paper of the ACR Incidental Thyroid Findings Committee. J Am Coll Radiol 2015; 12:143-150. 5.  Aortic atherosclerosis (ICD10-I70.0). Electronically Signed   By:  Titus Dubin M.D.   On: 12/10/2017 18:46   Results for orders placed or performed in visit on 37/34/28  Basic Metabolic Panel (BMET)  Result Value Ref Range   Glucose 166 (H) 65 - 99 mg/dL   BUN 21 8 - 27 mg/dL   Creatinine, Ser 0.64 0.57 - 1.00 mg/dL   GFR calc non Af Amer 86 >59 mL/min/1.73   GFR calc Af Amer 99 >59 mL/min/1.73   BUN/Creatinine Ratio 33 (H) 12 - 28   Sodium 136 134 - 144 mmol/L   Potassium 5.7 (H) 3.5 - 5.2 mmol/L   Chloride 100 96 - 106 mmol/L   CO2 24 20 - 29 mmol/L   Calcium 10.1 8.7 - 10.3 mg/dL    EKG - NSR rate 70, normal axis, intervals, no acute ST/T changes. 3 PVCs with bigeminy    Assessment & Plan:   Problem List Items Addressed This Visit    Tobacco abuse    No cigarette in last 2 days! Using nicotine replacement (nicoderm CQ) 42mcg dose. Reviewed anticipated taper schedule.       Sinus bradycardia, beta blocker intol. (Chronic)    Slowed heart rate noted - however I think this is coupling. Check EKG today.       Relevant Orders   EKG 12-Lead (Completed)   Shortness of breath   Relevant Medications   ipratropium-albuterol (DUONEB) 0.5-2.5 (3) MG/3ML nebulizer solution 3 mL (Completed)   Other Relevant Orders   EKG 12-Lead (Completed)   Pulmonary nodules    On recent CTA 11/2017 - rec rpt 6 mo.  I have ordered this.       Relevant Orders   CT Chest Wo Contrast   Hyperkalemia    Cards has stopped potassium, increased lasix x3 days, planned repeat lab next week.       COPD (chronic obstructive pulmonary disease) ([place]) - Primary    Reviewed with patient and daughter. Responds better to nebulizer treatment so will Rx this - may not be getting all medication into lungs when using inhaler. RTC 2-3 weeks for spirometry for further evaluation.       Relevant Medications   albuterol (PROVENTIL) (2.5 MG/3ML) 0.083% nebulizer solution   ipratropium-albuterol (DUONEB) 0.5-2.5 (3) MG/3ML nebulizer solution 3 mL (Completed)   Other Relevant  Orders   EKG 12-Lead (Completed)   Chronic diastolic heart failure (HCC)    Mild by recent cards eval. Lasix PRN helped leg swelling.        Other Visit Diagnoses    Abnormal findings on diagnostic imaging of lung       Relevant Orders   CT Chest Wo Contrast       Meds ordered this encounter  Medications  . albuterol (PROVENTIL) (2.5 MG/3ML) 0.083% nebulizer solution    Sig: Take 3 mLs (2.5 mg total) by nebulization every 6 (six) hours as needed for wheezing or shortness of breath.    Dispense:  75 mL  Refill:  1  . ipratropium-albuterol (DUONEB) 0.5-2.5 (3) MG/3ML nebulizer solution 3 mL   Orders Placed This Encounter  Procedures  . CT Chest Wo Contrast    Standing Status:   Future    Standing Expiration Date:   02/14/2019    Scheduling Instructions:     Schedule in 6 months.    Order Specific Question:   ** REASON FOR EXAM (FREE TEXT)    Answer:   f/u lung nodules noted 11/2017 - future order for 05/2018    Order Specific Question:   Preferred imaging location?    Answer:   [place]    Order Specific Question:   Radiology Contrast Protocol - do NOT remove file path    Answer:   [email or web address]  . EKG 12-Lead    Follow up plan: Return in about 3 weeks (around 01/03/2018), or if symptoms worsen or fail to improve, for follow up visit.  Ria Bush, MD

## 2017-12-13 NOTE — Assessment & Plan Note (Signed)
Cards has stopped potassium, increased lasix x3 days, planned repeat lab next week.

## 2017-12-13 NOTE — Assessment & Plan Note (Signed)
Reviewed with patient and daughter. Responds better to nebulizer treatment so will Rx this - may not be getting all medication into lungs when using inhaler. RTC 2-3 weeks for spirometry for further evaluation.

## 2017-12-13 NOTE — Patient Instructions (Addendum)
Congratulations on cutting down on smoking! Repeat CT scan of chest will be due in 08/2018 (for pulmonary nodules).  Albuterol nebulizer sent to pharmacy.  Take nebulizer machine prescription to local [facility] store.  Good to see you today, return in 1 month for spirometry (breathing test), sooner if needed.

## 2017-12-13 NOTE — Assessment & Plan Note (Signed)
No cigarette in last 2 days! Using nicotine replacement (nicoderm CQ) 63mcg dose. Reviewed anticipated taper schedule.

## 2017-12-13 NOTE — Progress Notes (Signed)
Spoke with patient's daughter "her mothers arm is much better and had resolved the next day"

## 2017-12-13 NOTE — Assessment & Plan Note (Addendum)
Slowed heart rate noted - however I think this is coupling. Check EKG today.

## 2017-12-13 NOTE — Assessment & Plan Note (Signed)
Mild by recent cards eval. Lasix PRN helped leg swelling.

## 2017-12-13 NOTE — Assessment & Plan Note (Signed)
On recent CTA 11/2017 - rec rpt 6 mo.  I have ordered this.

## 2017-12-16 ENCOUNTER — Other Ambulatory Visit: Payer: Self-pay | Admitting: Endocrinology

## 2017-12-16 DIAGNOSIS — E0590 Thyrotoxicosis, unspecified without thyrotoxic crisis or storm: Secondary | ICD-10-CM

## 2017-12-19 DIAGNOSIS — Z79899 Other long term (current) drug therapy: Secondary | ICD-10-CM | POA: Diagnosis not present

## 2017-12-19 DIAGNOSIS — J449 Chronic obstructive pulmonary disease, unspecified: Secondary | ICD-10-CM | POA: Diagnosis not present

## 2017-12-19 NOTE — Addendum Note (Signed)
Addended by: Ria Bush on: 12/19/2017 08:09 AM   Modules accepted: Orders

## 2017-12-20 LAB — BASIC METABOLIC PANEL
BUN / CREAT RATIO: 23 (ref 12–28)
BUN: 14 mg/dL (ref 8–27)
CO2: 23 mmol/L (ref 20–29)
CREATININE: 0.62 mg/dL (ref 0.57–1.00)
Calcium: 9.7 mg/dL (ref 8.7–10.3)
Chloride: 100 mmol/L (ref 96–106)
GFR calc Af Amer: 100 mL/min/{1.73_m2} (ref >59)
GFR calc non Af Amer: 87 mL/min/{1.73_m2} (ref >59)
GLUCOSE: 127 mg/dL — AB (ref 65–99)
POTASSIUM: 5.4 mmol/L — AB (ref 3.5–5.2)
Sodium: 136 mmol/L (ref 134–144)

## 2017-12-21 ENCOUNTER — Telehealth: Payer: Self-pay

## 2017-12-21 NOTE — Telephone Encounter (Signed)
See result note: Notes recorded by Ledora Bottcher, PA on 12/20/2017 at 8:19 AM EDT Your potassium is better, but still high. Are you taking lasix daily? Please avoid foods high in potassium, do NOT take potassium supplementations. I would like for you to have repeat lab work when you see your PCP on 12/26/17.  How many days per week are you taking lasix?

## 2017-12-21 NOTE — Telephone Encounter (Signed)
Meredith Gutierrez

## 2017-12-22 DIAGNOSIS — M47816 Spondylosis without myelopathy or radiculopathy, lumbar region: Secondary | ICD-10-CM | POA: Diagnosis not present

## 2017-12-22 DIAGNOSIS — I10 Essential (primary) hypertension: Secondary | ICD-10-CM | POA: Diagnosis not present

## 2017-12-22 DIAGNOSIS — Z6825 Body mass index (BMI) 25.0-25.9, adult: Secondary | ICD-10-CM | POA: Diagnosis not present

## 2017-12-22 DIAGNOSIS — M47818 Spondylosis without myelopathy or radiculopathy, sacral and sacrococcygeal region: Secondary | ICD-10-CM | POA: Diagnosis not present

## 2017-12-26 ENCOUNTER — Encounter: Payer: Self-pay | Admitting: Family Medicine

## 2017-12-26 ENCOUNTER — Ambulatory Visit (INDEPENDENT_AMBULATORY_CARE_PROVIDER_SITE_OTHER): Payer: Medicare HMO | Admitting: Family Medicine

## 2017-12-26 VITALS — BP 120/70 | HR 56 | Temp 98.4°F | Ht 62.0 in | Wt 139.2 lb

## 2017-12-26 DIAGNOSIS — R918 Other nonspecific abnormal finding of lung field: Secondary | ICD-10-CM

## 2017-12-26 DIAGNOSIS — E875 Hyperkalemia: Secondary | ICD-10-CM | POA: Diagnosis not present

## 2017-12-26 DIAGNOSIS — Z720 Tobacco use: Secondary | ICD-10-CM | POA: Diagnosis not present

## 2017-12-26 DIAGNOSIS — R0602 Shortness of breath: Secondary | ICD-10-CM | POA: Diagnosis not present

## 2017-12-26 DIAGNOSIS — J449 Chronic obstructive pulmonary disease, unspecified: Secondary | ICD-10-CM

## 2017-12-26 DIAGNOSIS — J4540 Moderate persistent asthma, uncomplicated: Secondary | ICD-10-CM | POA: Diagnosis not present

## 2017-12-26 MED ORDER — ALBUTEROL SULFATE (2.5 MG/3ML) 0.083% IN NEBU
2.5000 mg | INHALATION_SOLUTION | Freq: Once | RESPIRATORY_TRACT | Status: AC
  Administered 2017-12-26: 2.5 mg via RESPIRATORY_TRACT

## 2017-12-26 MED ORDER — ALBUTEROL SULFATE HFA 108 (90 BASE) MCG/ACT IN AERS: 1.0000 | INHALATION_SPRAY | Freq: Four times a day (QID) | RESPIRATORY_TRACT | 3 refills | Status: DC | PRN

## 2017-12-26 NOTE — Assessment & Plan Note (Addendum)
Presumed COPD dx. However spirometry today more consistent with reactive airway component ?asthma. Currently treating with PRN albuterol - inhaler refilled. She responds better to nebulizer.  Given severity of recent exacerbation, will add spiriva  respimat to use 2 puffs daily for next few months then consider taper off. Avoid inhaled corticosteroid given prior reactions to prednisone.  Quitting smoking will help lung function as well - discussed reversible component to lung damage after quitting smoking.

## 2017-12-26 NOTE — Patient Instructions (Addendum)
Albuterol inhaler refilled.  Congratulations on quitting smoking!  Labs today.  Breathing test was overall looking good! ?adult asthma - may continue albuterol rescue inhaler, and trial pulmicort steroid inhaler  Let us know how you're doing.

## 2017-12-26 NOTE — Assessment & Plan Note (Addendum)
Quit smoking 2 wks ago, has not had cigarette since! Daughter able to wean her off nicoderm CQ last week. Congratulated on this.

## 2017-12-26 NOTE — Assessment & Plan Note (Signed)
Rpt CT will be due 05/2018

## 2017-12-26 NOTE — Assessment & Plan Note (Signed)
Off lasix, potassium. Update BMP today.

## 2017-12-26 NOTE — Progress Notes (Signed)
BP 120/70 (BP Location: Left Arm, Patient Position: Sitting, Cuff Size: Normal)   Pulse (!) 56   Temp 98.4 F (36.9 C) (Oral)   Ht 5\' 2"  (1.575 m)   Wt 139 lb 4 oz (63.2 kg)   SpO2 97%   BMI 25.47 kg/m    CC: 2 wk f/u visit Subjective:    Patient ID: Meredith Gutierrez, female    DOB: 1940/12/28, 77 y.o.   MRN: [id number]  HPI: Meredith Gutierrez is a 77 y.o. female presenting on 12/26/2017 for COPD (Here for 2-3 wk f/u and spirometry. Pt accompanied by daughter.)   See last office note, recent labs, phone note for details. Recent hyperkalemia - needs rechecked today. Ran out of lasix last week, has not been taking this. Not taking potassium supplementation either.   Recent ER visit for acute progressive dyspnea diagnosed with COPD exacerbation. Does better with nebulization treatments. Here for in office spirometry. Using albuterol neb PRN, about 5 times in last 2 wks, requests inhaler refill. Last neb use was Friday. Notes intermittent dypsnea especially exertional.   Will need repeat CT chest 05/2018 to follow recently found pulm nodules.   15 days without smoking! Has not used patch in last 3-4 days.   Significant nasal congestion.   Relevant past medical, surgical, family and social history reviewed and updated as indicated. Interim medical history since our last visit reviewed. Allergies and medications reviewed and updated. Outpatient Medications Prior to Visit  Medication Sig Dispense Refill  . albuterol (PROVENTIL) (2.5 MG/3ML) 0.083% nebulizer solution Take 3 mLs (2.5 mg total) by nebulization every 6 (six) hours as needed for wheezing or shortness of breath. 75 mL 1  . aspirin EC 81 MG tablet Take 81 mg by mouth daily.    . candesartan (ATACAND) 16 MG tablet Take 16 mg by mouth daily.    . carboxymethylcellulose (REFRESH PLUS) 0.5 % SOLN Place 1 drop into both eyes 2 (two) times daily.    . Cholecalciferol (VITAMIN D) 2000 units CAPS Take 1 capsule (2,000 Units total) by  mouth daily. 30 capsule   . diltiazem (CARDIZEM CD) 240 MG 24 hr capsule TAKE 1 CAPSULE BY MOUTH DAILY 90 capsule 3  . donepezil (ARICEPT) 5 MG tablet TAKE ONE TABLET BY MOUTH AT BEDTIME 30 tablet 3  . furosemide (LASIX) 20 MG tablet Take 1 tablet (20 mg total) by mouth daily as needed. For ankle swelling 10 tablet 0  . morphine (MS CONTIN) 15 MG 12 hr tablet Take 1 tablet by mouth 2 (two) times daily.    Marland Kitchen morphine (MSIR) 15 MG tablet Take 1 tablet (15 mg total) by mouth every 6 (six) hours as needed for moderate pain.  0  . nicotine (NICODERM CQ - DOSED IN MG/24 HOURS) 14 mg/24hr patch Place 1 patch (14 mg total) onto the skin daily. 28 patch 1  . oxymetazoline (AFRIN) 0.05 % nasal spray Place 2 sprays into both nostrils daily.    Marland Kitchen albuterol (PROVENTIL HFA;VENTOLIN HFA) 108 (90 Base) MCG/ACT inhaler Inhale 1-2 puffs into the lungs every 6 (six) hours as needed for wheezing or shortness of breath. 1 Inhaler 0   No facility-administered medications prior to visit.      Per HPI unless specifically indicated in ROS section below Review of Systems     Objective:    BP 120/70 (BP Location: Left Arm, Patient Position: Sitting, Cuff Size: Normal)   Pulse (!) 56   Temp 98.4 F (36.9  C) (Oral)   Ht 5\' 2"  (1.575 m)   Wt 139 lb 4 oz (63.2 kg)   SpO2 97%   BMI 25.47 kg/m   Wt Readings from Last 3 Encounters:  12/26/17 139 lb 4 oz (63.2 kg)  12/13/17 139 lb (63 kg)  12/12/17 139 lb 3.2 oz (63.1 kg)    Physical Exam  Constitutional: She appears well-developed and well-nourished. No distress.  HENT:  Mouth/Throat: Oropharynx is clear and moist. No oropharyngeal exudate.  Cardiovascular: Normal rate, regular rhythm and normal heart sounds.  No murmur heard. Pulmonary/Chest: Effort normal and breath sounds normal. No respiratory distress. She has no wheezes. She has no rales.  Musculoskeletal: She exhibits no edema.  Nursing note and vitals reviewed.  Results for orders placed or  performed in visit on 41/96/22  Basic metabolic panel  Result Value Ref Range   Sodium 135 135 - 145 mEq/L   Potassium 5.0 3.5 - 5.1 mEq/L   Chloride 102 96 - 112 mEq/L   CO2 25 19 - 32 mEq/L   Glucose, Bld 182 (H) 70 - 99 mg/dL   BUN 17 6 - 23 mg/dL   Creatinine, Ser 0.74 0.40 - 1.20 mg/dL   Calcium 9.2 8.4 - 10.5 mg/dL   GFR 80.47 >60.00 mL/min      Assessment & Plan:   Problem List Items Addressed This Visit    Tobacco abuse    Quit smoking 2 wks ago, has not had cigarette since! Daughter able to wean her off nicoderm CQ last week. Congratulated on this.       Shortness of breath   Pulmonary nodules    Rpt CT will be due 05/2018      Hyperkalemia    Off lasix, potassium. Update BMP today.       Relevant Orders   Basic metabolic panel (Completed)   Asthma - Primary    Presumed COPD dx. However spirometry today more consistent with reactive airway component ?asthma. Currently treating with PRN albuterol - inhaler refilled. She responds better to nebulizer.  Given severity of recent exacerbation, will add spiriva  respimat to use 2 puffs daily for next few months then consider taper off. Avoid inhaled corticosteroid given prior reactions to prednisone.  Quitting smoking will help lung function as well - discussed reversible component to lung damage after quitting smoking.       Relevant Medications   albuterol (PROVENTIL HFA;VENTOLIN HFA) 108 (90 Base) MCG/ACT inhaler   albuterol (PROVENTIL) (2.5 MG/3ML) 0.083% nebulizer solution 2.5 mg (Completed)       Meds ordered this encounter  Medications  . albuterol (PROVENTIL HFA;VENTOLIN HFA) 108 (90 Base) MCG/ACT inhaler    Sig: Inhale 1-2 puffs into the lungs every 6 (six) hours as needed for wheezing or shortness of breath.    Dispense:  1 Inhaler    Refill:  3  . albuterol (PROVENTIL) (2.5 MG/3ML) 0.083% nebulizer solution 2.5 mg   Orders Placed This Encounter  Procedures  . Basic metabolic panel  . PR EVAL OF  BRONCHOSPASM    Follow up plan: Return if symptoms worsen or fail to improve.  Ria Bush, MD

## 2017-12-27 ENCOUNTER — Telehealth: Payer: Self-pay | Admitting: Family Medicine

## 2017-12-27 DIAGNOSIS — H40053 Ocular hypertension, bilateral: Secondary | ICD-10-CM | POA: Diagnosis not present

## 2017-12-27 DIAGNOSIS — H02005 Unspecified entropion of left lower eyelid: Secondary | ICD-10-CM | POA: Diagnosis not present

## 2017-12-27 LAB — BASIC METABOLIC PANEL
BUN: 17 mg/dL (ref 6–23)
CHLORIDE: 102 meq/L (ref 96–112)
CO2: 25 meq/L (ref 19–32)
Calcium: 9.2 mg/dL (ref 8.4–10.5)
Creatinine, Ser: 0.74 mg/dL (ref 0.40–1.20)
GFR: 80.47 mL/min (ref >60.00)
Glucose, Bld: 182 mg/dL — ABNORMAL HIGH (ref 70–99)
POTASSIUM: 5 meq/L (ref 3.5–5.1)
SODIUM: 135 meq/L (ref 135–145)

## 2017-12-27 MED ORDER — TIOTROPIUM BROMIDE MONOHYDRATE 1.25 MCG/ACT IN AERS: 2.0000 | INHALATION_SPRAY | Freq: Every day | RESPIRATORY_TRACT | 3 refills | Status: DC

## 2017-12-27 NOTE — Telephone Encounter (Signed)
Left message on vm per dpr for pt's daughter, [name] (on dpr), relaying Dr. Synthia Innocent message and instructions.

## 2017-12-27 NOTE — Telephone Encounter (Signed)
plz call daughter. Given h/o steroid allergy will not do daily controller pulmicort steroid inhaler.  Instead let's try daily spiriva respimat sent to pharmacy 2 puffs at night time. Update [place] with effect.

## 2017-12-28 ENCOUNTER — Ambulatory Visit
Admission: RE | Admit: 2017-12-28 | Discharge: 2017-12-28 | Disposition: A | Discharge status: Home / Self Care | Payer: Medicare HMO | Source: Ambulatory Visit | Attending: Endocrinology | Admitting: Endocrinology

## 2017-12-28 DIAGNOSIS — E042 Nontoxic multinodular goiter: Secondary | ICD-10-CM | POA: Diagnosis not present

## 2017-12-28 DIAGNOSIS — E0590 Thyrotoxicosis, unspecified without thyrotoxic crisis or storm: Secondary | ICD-10-CM

## 2018-01-18 ENCOUNTER — Telehealth: Payer: Self-pay | Admitting: Family Medicine

## 2018-01-18 NOTE — Telephone Encounter (Signed)
Copied from [place] [id number]. Topic: Quick Communication - See Telephone Encounter >> Jan 18, 2018  8:45 AM Synthia Innocent wrote: CRM for notification. See Telephone encounter for: 01/18/18. Per family, having trouble at night with getting agitated requesting for something to calm her nerves. Please advise

## 2018-01-19 MED ORDER — HYDROXYZINE HCL 10 MG PO TABS: 10.0000 mg | ORAL_TABLET | Freq: Every evening | ORAL | 0 refills | Status: DC | PRN

## 2018-01-19 NOTE — Telephone Encounter (Signed)
We could try hydroxyzine PRN agitation at night. It will make her sleepy. May take 1-2 tablets at night as needed.

## 2018-01-23 NOTE — Telephone Encounter (Signed)
Patient's daughter [name]) notified as instructed by telephone and verbalized understanding. [name] stated that she has already picked the medication up today.

## 2018-01-30 ENCOUNTER — Other Ambulatory Visit: Payer: Self-pay | Admitting: Family Medicine

## 2018-01-31 NOTE — Telephone Encounter (Signed)
Looks like pt has only been on candesartan about 1.5 mos.  Do you want this refilled?

## 2018-02-02 NOTE — Telephone Encounter (Signed)
Pt's daughter, [name], calling to check on the status of this.  States pt is completely out of medication and pharmacy gave them three more pills until they could get in touch with physician.

## 2018-02-03 NOTE — Telephone Encounter (Signed)
plz notify this was sent in. 

## 2018-02-03 NOTE — Telephone Encounter (Signed)
Left message on vm per dpr notifying pt rx was sent to pharmacy.  

## 2018-02-06 ENCOUNTER — Other Ambulatory Visit: Payer: Self-pay | Admitting: Family Medicine

## 2018-02-06 NOTE — Telephone Encounter (Signed)
Hydroxyzine Last filled:  01/19/18, #30 Last OV:   12/26/17 Next OV:  02/08/18  Pt recently started med. Wasn't sure to refill or not.

## 2018-02-08 ENCOUNTER — Encounter: Payer: Self-pay | Admitting: Family Medicine

## 2018-02-08 ENCOUNTER — Ambulatory Visit (INDEPENDENT_AMBULATORY_CARE_PROVIDER_SITE_OTHER): Payer: Medicare HMO | Admitting: Family Medicine

## 2018-02-08 VITALS — BP 120/76 | HR 66 | Temp 98.2°F | Ht 62.0 in | Wt 141.2 lb

## 2018-02-08 DIAGNOSIS — Z85820 Personal history of malignant melanoma of skin: Secondary | ICD-10-CM

## 2018-02-08 DIAGNOSIS — A630 Anogenital (venereal) warts: Secondary | ICD-10-CM

## 2018-02-08 DIAGNOSIS — F05 Delirium due to known physiological condition: Secondary | ICD-10-CM | POA: Insufficient documentation

## 2018-02-08 DIAGNOSIS — D229 Melanocytic nevi, unspecified: Secondary | ICD-10-CM | POA: Diagnosis not present

## 2018-02-08 DIAGNOSIS — R451 Restlessness and agitation: Secondary | ICD-10-CM

## 2018-02-08 MED ORDER — HYDROXYZINE HCL 10 MG PO TABS: 20.0000 mg | ORAL_TABLET | Freq: Every evening | ORAL | 6 refills | Status: DC | PRN

## 2018-02-08 NOTE — Assessment & Plan Note (Signed)
Increased night time agitation - daughter concerned about sundowning. She has done well with 20mg  hydroxyzine but notes some persistent am sedation. Discussed common side effects of medication. Would not increase dose due to anticholinergic concerns. If worsening, consider trial trazodone.  Consider increased aricept dosing.

## 2018-02-08 NOTE — Assessment & Plan Note (Addendum)
Start with derm referral for eval. May need surgical eval.

## 2018-02-08 NOTE — Progress Notes (Addendum)
BP 120/76 (BP Location: Left Arm, Patient Position: Sitting, Cuff Size: Normal)   Pulse 66   Temp 98.2 F (36.8 C) (Oral)   Ht 5\' 2"  (1.575 m)   Wt 141 lb 4 oz (64.1 kg)   SpO2 95%   BMI 25.83 kg/m    CC: check growth between buttock Subjective:    Patient ID: Meredith Gutierrez, female    DOB: 1940/11/07, 77 y.o.   MRN: [id number]  HPI: Meredith Gutierrez is a 77 y.o. female presenting on 02/08/2018 for Cyst (C/o 2 growths, 1 on each buttock. Has had for yrs but have recently increased in size and are sometimes tender to touch.  Pt accompanied by her daughter. )   1+ yr h/o growth between buttocks. Recently getting more irritated. Daughter noticed it last week. No bleeding.   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  Increased night time agitation - we started hydroxyzine 10mg  QHS PRN. Doing better with 20mg  dose, however some AM grogginess.   Relevant past medical, surgical, family and social history reviewed and updated as indicated. Interim medical history since our last visit reviewed. Allergies and medications reviewed and updated. Outpatient Medications Prior to Visit  Medication Sig Dispense Refill  . albuterol (PROVENTIL HFA;VENTOLIN HFA) 108 (90 Base) MCG/ACT inhaler Inhale 1-2 puffs into the lungs every 6 (six) hours as needed for wheezing or shortness of breath. 1 Inhaler 3  . albuterol (PROVENTIL) (2.5 MG/3ML) 0.083% nebulizer solution Take 3 mLs (2.5 mg total) by nebulization every 6 (six) hours as needed for wheezing or shortness of breath. 75 mL 1  . aspirin EC 81 MG tablet Take 81 mg by mouth daily.    . candesartan (ATACAND) 16 MG tablet TAKE 1 TABLET BY MOUTH DAILY 30 tablet 6  . carboxymethylcellulose (REFRESH PLUS) 0.5 % SOLN Place 1 drop into both eyes 2 (two) times daily.    . Cholecalciferol (VITAMIN D) 2000 units CAPS Take 1 capsule (2,000 Units total) by mouth daily. 30 capsule   . diltiazem (CARDIZEM CD) 240 MG 24 hr capsule TAKE 1 CAPSULE BY MOUTH DAILY 90  capsule 3  . donepezil (ARICEPT) 5 MG tablet TAKE ONE TABLET BY MOUTH AT BEDTIME 30 tablet 3  . furosemide (LASIX) 20 MG tablet Take 1 tablet (20 mg total) by mouth daily as needed. For ankle swelling 10 tablet 0  . morphine (MS CONTIN) 15 MG 12 hr tablet Take 1 tablet by mouth 2 (two) times daily.    Marland Kitchen morphine (MSIR) 15 MG tablet Take 1 tablet (15 mg total) by mouth every 6 (six) hours as needed for moderate pain.  0  . nicotine (NICODERM CQ - DOSED IN MG/24 HOURS) 14 mg/24hr patch Place 1 patch (14 mg total) onto the skin daily. 28 patch 1  . oxymetazoline (AFRIN) 0.05 % nasal spray Place 2 sprays into both nostrils daily.    . Tiotropium Bromide Monohydrate (SPIRIVA RESPIMAT) 1.25 MCG/ACT AERS Inhale 2 Act into the lungs at bedtime. 4 g 3  . hydrOXYzine (ATARAX/VISTARIL) 10 MG tablet Take 1-2 tablets (10-20 mg total) by mouth at bedtime as needed for anxiety. 30 tablet 0   No facility-administered medications prior to visit.      Per HPI unless specifically indicated in ROS section below Review of Systems     Objective:    BP 120/76 (BP Location: Left Arm, Patient Position: Sitting, Cuff Size: Normal)   Pulse 66   Temp 98.2 F (36.8  C) (Oral)   Ht 5\' 2"  (1.575 m)   Wt 141 lb 4 oz (64.1 kg)   SpO2 95%   BMI 25.83 kg/m   Wt Readings from Last 3 Encounters:  02/08/18 141 lb 4 oz (64.1 kg)  12/26/17 139 lb 4 oz (63.2 kg)  12/13/17 139 lb (63 kg)    Physical Exam  Constitutional: She appears well-developed and well-nourished. No distress.  Skin: Skin is warm and dry.  Multiple small atypical nevi on back that exhibit color change and asymmetry Two exophytic cauliflower growths on bilateral buttock without erythema, irritation or bleed  Nursing note and vitals reviewed.  Results for orders placed or performed in visit on 69/62/95  Basic metabolic panel  Result Value Ref Range   Sodium 135 135 - 145 mEq/L   Potassium 5.0 3.5 - 5.1 mEq/L   Chloride 102 96 - 112 mEq/L   CO2  25 19 - 32 mEq/L   Glucose, Bld 182 (H) 70 - 99 mg/dL   BUN 17 6 - 23 mg/dL   Creatinine, Ser 0.74 0.40 - 1.20 mg/dL   Calcium 9.2 8.4 - 10.5 mg/dL   GFR 80.47 >60.00 mL/min      Assessment & Plan:   Problem List Items Addressed This Visit    Multiple atypical nevi    Concern in h/o MM. Will refer to derm for further eval of atypical nevi on back.       Relevant Orders   Ambulatory referral to Dermatology   History of melanoma    Has not seen derm recently - overdue.       Genital warts - Primary    Start with derm referral for eval. May need surgical eval.       Relevant Orders   Ambulatory referral to Dermatology   Agitation    Increased night time agitation - daughter concerned about sundowning. She has done well with 20mg  hydroxyzine but notes some persistent am sedation. Discussed common side effects of medication. Would not increase dose due to anticholinergic concerns. If worsening, consider trial trazodone.  Consider increased aricept dosing.           Meds ordered this encounter  Medications  . hydrOXYzine (ATARAX/VISTARIL) 10 MG tablet    Sig: Take 2 tablets (20 mg total) by mouth at bedtime as needed for anxiety.    Dispense:  60 tablet    Refill:  6   Orders Placed This Encounter  Procedures  . Ambulatory referral to Dermatology    Referral Priority:   Routine    Referral Type:   Consultation    Referral Reason:   Specialty Services Required    Requested Specialty:   Dermatology    Number of Visits Requested:   1    Follow up plan: No follow-ups on file.  Ria Bush, MD

## 2018-02-08 NOTE — Patient Instructions (Signed)
We will refer you to dermatologist for atypical moles on back and warts on bottom for evaluation.

## 2018-02-08 NOTE — Assessment & Plan Note (Signed)
Concern in h/o MM. Will refer to derm for further eval of atypical nevi on back.

## 2018-02-08 NOTE — Assessment & Plan Note (Signed)
Has not seen derm recently - overdue.

## 2018-02-23 ENCOUNTER — Telehealth: Payer: Self-pay | Admitting: *Deleted

## 2018-02-23 NOTE — Telephone Encounter (Signed)
Copied from [place] [place] [id number]. Topic: General - Other >> Feb 23, 2018  3:08 PM Yvette Rack wrote: Reason for CRM: pt daughter [name] [phone] calling stating that her mothers ankles is starting to swell again and would like a fluid pill sent to the [facility] [place] [phone] (Phone) [phone] (Fax)

## 2018-02-24 MED ORDER — FUROSEMIDE 20 MG PO TABS: 20.0000 mg | ORAL_TABLET | Freq: Every day | ORAL | 0 refills | Status: DC | PRN

## 2018-02-24 NOTE — Telephone Encounter (Signed)
plz notify this was sent in. 

## 2018-02-24 NOTE — Telephone Encounter (Signed)
Spoke with pt's daughter, [name] (on dpr), notifying her Lasix was sent to [facility].  Expresses thanks.

## 2018-03-01 DIAGNOSIS — D225 Melanocytic nevi of trunk: Secondary | ICD-10-CM | POA: Diagnosis not present

## 2018-03-01 DIAGNOSIS — B078 Other viral warts: Secondary | ICD-10-CM | POA: Diagnosis not present

## 2018-03-01 DIAGNOSIS — L821 Other seborrheic keratosis: Secondary | ICD-10-CM | POA: Diagnosis not present

## 2018-03-01 DIAGNOSIS — D485 Neoplasm of uncertain behavior of skin: Secondary | ICD-10-CM | POA: Diagnosis not present

## 2018-03-01 DIAGNOSIS — L820 Inflamed seborrheic keratosis: Secondary | ICD-10-CM | POA: Diagnosis not present

## 2018-03-15 DIAGNOSIS — M47816 Spondylosis without myelopathy or radiculopathy, lumbar region: Secondary | ICD-10-CM | POA: Diagnosis not present

## 2018-03-15 DIAGNOSIS — Z6827 Body mass index (BMI) 27.0-27.9, adult: Secondary | ICD-10-CM | POA: Diagnosis not present

## 2018-03-16 ENCOUNTER — Ambulatory Visit: Payer: Medicare HMO | Admitting: Cardiovascular Disease

## 2018-03-16 ENCOUNTER — Encounter: Payer: Self-pay | Admitting: Cardiovascular Disease

## 2018-03-16 VITALS — BP 122/58 | HR 84 | Ht 61.0 in | Wt 150.0 lb

## 2018-03-16 DIAGNOSIS — I5032 Chronic diastolic (congestive) heart failure: Secondary | ICD-10-CM | POA: Diagnosis not present

## 2018-03-16 DIAGNOSIS — E7800 Pure hypercholesterolemia, unspecified: Secondary | ICD-10-CM | POA: Diagnosis not present

## 2018-03-16 DIAGNOSIS — I2510 Atherosclerotic heart disease of native coronary artery without angina pectoris: Secondary | ICD-10-CM | POA: Diagnosis not present

## 2018-03-16 DIAGNOSIS — I773 Arterial fibromuscular dysplasia: Secondary | ICD-10-CM | POA: Diagnosis not present

## 2018-03-16 DIAGNOSIS — Z79899 Other long term (current) drug therapy: Secondary | ICD-10-CM

## 2018-03-16 DIAGNOSIS — I10 Essential (primary) hypertension: Secondary | ICD-10-CM

## 2018-03-16 MED ORDER — FUROSEMIDE 20 MG PO TABS: ORAL_TABLET | ORAL | 5 refills | Status: DC

## 2018-03-16 NOTE — Patient Instructions (Signed)
Medication Instructions: Dr Sallyanne Kuster has recommended making the following medication changes: 1. INCREASE Furosemide to 40 mg DAILY until your weight is less than 140 pounds - then take Furosemide 20 mg three days per week  Your physician recommends that you weigh, daily, at the same time every day, and in the same amount of clothing. Please record your daily weights on the handout provided and bring it to your next appointment.  Labwork: Your physician recommends that you return for lab work in 3 weeks.  Testing/Procedures: NONE ORDERED  Follow-up: Your physician recommends that you schedule a follow-up appointment in 4 weeks with either Doreene Adas, PA or Jory Sims, DNP.  Dr Sallyanne Kuster recommends that you schedule a follow-up appointment in 6 months. You will receive a reminder letter in the mail two months in advance. If you don't receive a letter, please call our office to schedule the follow-up appointment.  If you need a refill on your cardiac medications before your next appointment, please call your pharmacy.

## 2018-03-16 NOTE — Progress Notes (Signed)
Cardiology Office Note    Date:  03/17/2018   ID:  MIAROSE LIPPERT, DOB 06-28-1940, MRN [id number]  PCP:  Ria Bush, MD  Cardiologist:   Sanda Klein, MD   Chief Complaint  Patient presents with  . Edema  . Shortness of Breath    History of Present Illness:  HORTENCIA MARTIRE is a 77 y.o. female with CAD S/P CABG (2003 LIMA to LAD and SVG to first diagonal, ligation of large LAD artery aneurysm, atretic LIMA with patent LAD at cardiac cath in 2013), without coronary events since that time. She has hyperdynamic left ventricular systolic function, moderate LVH, chronic diastolic heart failure, hyperlipidemia intolerant to numerous attempts at lipid lowering therapy (rosuvastatin, simvastatin, Zetia and others). She is unable to take beta blockers due to bradycardia.   Her daughter helps a lot with her review of systems, since Mrs. Ciulla is having worsening short-term memory loss.  It seems she has been gaining fluid again, after having had a decent response to the diuretic prescribed by K. Purcell Nails, NP.  Recently she has had problems with edema and shortness of breath and abdominal bloating.  Her daughter notices that she becomes very short of breath when climbing stairs but she does not seem to have orthopnea or PND.  She has 2+ pitting edema today.  She has not had any angina either at rest or with activity.  She has not had dizziness or syncope.  She does not have palpitations.  She has probably quit smoking  Her weight has increased from 139 in July, 141 in late August to 150 pounds today.  She was previously diagnosed as having renal artery stenosis due to fibromuscular dysplasia, but the most recent duplex ultrasound studies performed in 2016 and 2018 did not show meaningful stenosis.  Past Medical History:  Diagnosis Date  . Angina    never needed to take nitroglycerin.  . Anxiety   . Blood transfusion without reported diagnosis   . Cerebral atherosclerosis   . COPD  (chronic obstructive pulmonary disease) ([facility])    02-16-13-"pt. denies this"-shows on CXR of 2- 2013.  Marland Kitchen Coronary artery disease 2003   s/p CABG 2014  . Diastolic CHF, on admit treated. 08/10/2011  . Fibromuscular dysplasia of renal artery (HCC)    right  . Heart murmur   . History of kidney stones 02-16-13   "hx. of overgrowth of muscle-causing some stricture"renal artery stenosis-being followed [facility]  . Hyperlipidemia   . Hypertension   . Hyperthyroidism    [place]  . Melanoma ([facility]) 2010   skin cancers, 1 melanoma removed back  . Osteoarthritis     Past Surgical History:  Procedure Laterality Date  . ABDOMINAL HYSTERECTOMY  1970   heavy bleeding  . APPENDECTOMY  1960s  . CARDIAC CATHETERIZATION  08/09/2011   no sign. coronary obstruction. LIMA is atretic w/excellent flow down the native LAD  . CARDIAC CATHETERIZATION  08/22/2001   no sign. coronary stenosis,  . CATARACT EXTRACTION, BILATERAL Bilateral   . CESAREAN SECTION    . CORONARY ARTERY BYPASS GRAFT  02-16-13   3'03-no bypasses -Had 2 aneurysm repairs(stents used)  . LAPAROTOMY N/A 02/20/2013   Procedure: EXPLORATORY LAPAROTOMY;  [name], MD  . LEFT HEART CATHETERIZATION WITH CORONARY ANGIOGRAM N/A 08/09/2011   Procedure: LEFT HEART CATHETERIZATION WITH CORONARY ANGIOGRAM;  Surgeon: Sanda Klein, MD;  Location: [facility] C[facility];  Service: Cardiovascular;  Laterality: N/A;  . NM MYOCAR PERF WALL MOTION  07/20/2010  normal  . SALPINGOOPHORECTOMY Bilateral 02/20/2013   benign seromucinous cystadenofibroma R ovary [name], MD)  . TOOTH EXTRACTION    . TOTAL HIP ARTHROPLASTY Left 2015    Current Medications: Outpatient Medications Prior to Visit  Medication Sig Dispense Refill  . albuterol (PROVENTIL HFA;VENTOLIN HFA) 108 (90 Base) MCG/ACT inhaler Inhale 1-2 puffs into the lungs every 6 (six) hours as needed for wheezing or shortness of breath. 1 Inhaler 3  . albuterol (PROVENTIL) (2.5 MG/3ML) 0.083% nebulizer solution  Take 3 mLs (2.5 mg total) by nebulization every 6 (six) hours as needed for wheezing or shortness of breath. 75 mL 1  . aspirin EC 81 MG tablet Take 81 mg by mouth daily.    . candesartan (ATACAND) 16 MG tablet TAKE 1 TABLET BY MOUTH DAILY 30 tablet 6  . carboxymethylcellulose (REFRESH PLUS) 0.5 % SOLN Place 1 drop into both eyes 2 (two) times daily.    . Cholecalciferol (VITAMIN D) 2000 units CAPS Take 1 capsule (2,000 Units total) by mouth daily. 30 capsule   . diltiazem (CARDIZEM CD) 240 MG 24 hr capsule TAKE 1 CAPSULE BY MOUTH DAILY 90 capsule 3  . donepezil (ARICEPT) 5 MG tablet TAKE ONE TABLET BY MOUTH AT BEDTIME 30 tablet 3  . hydrOXYzine (ATARAX/VISTARIL) 10 MG tablet Take 2 tablets (20 mg total) by mouth at bedtime as needed for anxiety. 60 tablet 6  . morphine (MS CONTIN) 15 MG 12 hr tablet Take 1 tablet by mouth 2 (two) times daily.    Marland Kitchen morphine (MSIR) 15 MG tablet Take 1 tablet (15 mg total) by mouth every 6 (six) hours as needed for moderate pain.  0  . nicotine (NICODERM CQ - DOSED IN MG/24 HOURS) 14 mg/24hr patch Place 1 patch (14 mg total) onto the skin daily. 28 patch 1  . oxymetazoline (AFRIN) 0.05 % nasal spray Place 2 sprays into both nostrils daily.    . Tiotropium Bromide Monohydrate (SPIRIVA RESPIMAT) 1.25 MCG/ACT AERS Inhale 2 Act into the lungs at bedtime. 4 g 3  . furosemide (LASIX) 20 MG tablet Take 1 tablet (20 mg total) by mouth daily as needed. For ankle swelling 30 tablet 0   No facility-administered medications prior to visit.      Allergies:   Codeine; Penicillin g; Penicillins; Prednisolone acetate; Meloxicam; Nitroglycerin; Crestor [rosuvastatin calcium]; Lopressor [metoprolol tartrate]; Prednisone; Sulfamethoxazole; and Zetia [ezetimibe]   Social History   Socioeconomic History  . Marital status: Widowed    Spouse name: Not on file  . Number of children: Not on file  . Years of education: Not on file  . Highest education level: Not on file    Occupational History  . Not on file  Social Needs  . Financial resource strain: Not on file  . Food insecurity:    Worry: Not on file    Inability: Not on file  . Transportation needs:    Medical: Not on file    Non-medical: Not on file  Tobacco Use  . Smoking status: Former Smoker    Packs/day: 0.10    Years: 20.00    Pack years: 2.00    Types: Cigarettes    Last attempt to quit: 09/14/2017    Years since quitting: 0.5  . Smokeless tobacco: Never Used  . Tobacco comment: smoking cessation info given  Substance and Sexual Activity  . Alcohol use: No  . Drug use: No  . Sexual activity: Never  Lifestyle  . Physical activity:  Days per week: Not on file    Minutes per session: Not on file  . Stress: Not on file  Relationships  . Social connections:    Talks on phone: Not on file    Gets together: Not on file    Attends religious service: Not on file    Active member of club or organization: Not on file    Attends meetings of clubs or organizations: Not on file    Relationship status: Not on file  Other Topics Concern  . Not on file  Social History Narrative   Widow   Lives with daughter and daughter's GF   Edu: HS   Occ: retired, worked at [employer]   Activity: no regular exercise   Diet: good water      Family History:  The patient's family history includes Breast cancer in her maternal aunt; Coronary artery disease in her father; Dementia (age of onset: 61) in her mother; Diabetes in her brother; Hypertension in her brother and father; Hypotension in her mother and sister; Lung cancer in her daughter; Stroke in her mother; Thyroid cancer in her daughter.   ROS:   Please see the history of present illness.    ROS All other systems reviewed and are negative.   PHYSICAL EXAM:   VS:  BP (!) 122/58 (BP Location: Left Arm, Patient Position: Sitting, Cuff Size: Normal)   Pulse 84   Ht 5\' 1"  (1.549 m)   Wt 150 lb (68 kg)   BMI 28.34 kg/m     General: Alert, oriented  x3, no distress, appears comfortable at rest Head: no evidence of trauma, PERRL, EOMI, no exophtalmos or lid lag, no myxedema, no xanthelasma; normal ears, nose and oropharynx Neck: 6-8 cm elevation jugular venous pulsations and prominent hepatojugular reflux; brisk carotid pulses without delay and no carotid bruits Chest: clear to auscultation, no signs of consolidation by percussion or palpation, normal fremitus, symmetrical and full respiratory excursions Cardiovascular: normal position and quality of the apical impulse, regular rhythm, normal first and second heart sounds, no murmurs, rubs or gallops Abdomen: no tenderness or distention, no masses by palpation, no abnormal pulsatility or arterial bruits, normal bowel sounds, appears to have mild hepatomegaly, spleen not palpable Extremities: no clubbing, cyanosis ; symmetrical 2-3+ pretibial edema; 2+ radial, ulnar and brachial pulses bilaterally; 2+ right femoral, posterior tibial and dorsalis pedis pulses; 2+ left femoral, posterior tibial and dorsalis pedis pulses; no subclavian or femoral bruits Neurological: grossly nonfocal Psych: Normal mood and affect   Wt Readings from Last 3 Encounters:  03/16/18 150 lb (68 kg)  02/08/18 141 lb 4 oz (64.1 kg)  12/26/17 139 lb 4 oz (63.2 kg)      Studies/Labs Reviewed:   ECHO 09/01/2017: - Left ventricle: The cavity size was normal. There was mild   concentric hypertrophy. Systolic function was normal. The   estimated ejection fraction was in the range of 55% to 60%. Wall   motion was normal; there were no regional wall motion   abnormalities. Doppler parameters are consistent with abnormal   left ventricular relaxation (grade 1 diastolic dysfunction). - Aortic valve: Trileaflet; moderately thickened, severely   calcified leaflets. Transvalvular velocity was within the normal   range. There was no stenosis. E/e' was 14.68  EKG:  EKG is not ordered today.  ECG from December 13, 2017 shows  sinus rhythm with frequent PVCs (monomorphic, transient bigeminy), possible left atrial abnormality, no repolarization changes  Recent Labs: 07/14/2017: TSH 0.72 12/10/2017:  B Natriuretic Peptide 76.8; Hemoglobin 14.3; Platelets 311 12/26/2017: BUN 17; Creatinine, Ser 0.74; Potassium 5.0; Sodium 135   Lipid Panel    Component Value Date/Time   CHOL 216 (H) 04/06/2017 1113   TRIG 278.0 (H) 04/06/2017 1113   HDL 35.90 (L) 04/06/2017 1113   CHOLHDL 6 04/06/2017 1113   VLDL 55.6 (H) 04/06/2017 1113   LDLCALC 123 02/06/2016   LDLDIRECT 137.0 04/06/2017 1113    ASSESSMENT:    1. Chronic diastolic heart failure ([facility])   2. Essential hypertension   3. Arterial fibromuscular dysplasia (H[facility])   4. Coronary artery disease involving native coronary artery of native heart without angina pectoris   5. Hypercholesterolemia   6. Medication management      PLAN:  In order of problems listed above:  1. CHF: She appears clinically hypervolemic, NYHA functional class II.  We will increase diuretics.  Target "dry weight" around 140 pounds 2. HTN: Well-controlled. Note history of intolerance to beta blockers.  3. History of left renal artery stenosis: No stenosis over 60% on studies from 2016 and 2018 4. CAD: She does not have angina pectoris, most recent cardiac catheterization with low risk anatomy. 5. HLP: Statin intolerance with multiple attempts. Tried red-yeast-rice without any benefit.  Given the progression of her cognitive deficits and deteriorating quality of life, not sure if PCSK9 inhibitors are warranted. 6. Tobacco abuse: Congratulated on quitting  Medication Adjustments/Labs and Tests Ordered: Current medicines are reviewed at length with the patient today.  Concerns regarding medicines are outlined above.  Medication changes, Labs and Tests ordered today are listed in the Patient Instructions below. Patient Instructions  Medication Instructions: Dr Sallyanne Kuster has recommended making the  following medication changes: 1. INCREASE Furosemide to 40 mg DAILY until your weight is less than 140 pounds - then take Furosemide 20 mg three days per week  Your physician recommends that you weigh, daily, at the same time every day, and in the same amount of clothing. Please record your daily weights on the handout provided and bring it to your next appointment.  Labwork: Your physician recommends that you return for lab work in 3 weeks.  Testing/Procedures: NONE ORDERED  Follow-up: Your physician recommends that you schedule a follow-up appointment in 4 weeks with either Doreene Adas, PA or Jory Sims, DNP.  Dr Sallyanne Kuster recommends that you schedule a follow-up appointment in 6 months. You will receive a reminder letter in the mail two months in advance. If you don't receive a letter, please call our office to schedule the follow-up appointment.  If you need a refill on your cardiac medications before your next appointment, please call your pharmacy.    Signed, Sanda Klein, MD  03/17/2018 2:11 PM    [facility] [address], [place], [place]  [address] Phone: [phone]; Fax: [phone]

## 2018-03-17 ENCOUNTER — Encounter: Payer: Self-pay | Admitting: Cardiovascular Disease

## 2018-03-27 ENCOUNTER — Ambulatory Visit: Payer: Self-pay

## 2018-03-27 NOTE — Telephone Encounter (Addendum)
Ensure she's taking aricept and lasix in the morning. Ensure calm quiet dark environment for sleep, avoid naps during the day.  May try melatonin 3-6 mg at night for sleep. Would back off hydroxyzine.  If no improvement, let us know (consider trazodone)

## 2018-03-27 NOTE — Telephone Encounter (Signed)
Spoke with pt's daughter, [name] (on dpr), relaying message and instructions per Dr. Henriette Combs understanding.  Per [name], pt is taking Aricept and Lasix I the mornings and has been taking melatonin 10 mg at bedtime. However, says she will speak with [facility] nurse to try and keep pt from napping during the day and will have pt try backing off the hydroxyzine to see if that helps.  [name] will call later with an update. Fyi to Dr. Darnell Level.

## 2018-03-27 NOTE — Telephone Encounter (Signed)
Call returned to patient daughter. VM to call the office.

## 2018-03-27 NOTE — Telephone Encounter (Signed)
Pt daughter called to report her mother is not sleeping. She says it has come on over the last 3 weeks. She states she is sun downing bad and would like advice.  She has taken her off all caffeine. Will route to office.   Additional Information . Commented on: All Negative - Home Care    Pt daughter needs advice will route to office  Answer Assessment - Initial Assessment Questions 1. DESCRIPTION: "Tell me about your sleeping problem."      Sun downing 2. ONSET: "How long have you been having trouble sleeping?" (e.g., days, weeks, months)     3weeks 3. RECURRENT: "Have you had sleeping problems before?"  If yes: "What happened that time?" "What helped your sleeping problem go away in the past?"      dementia 4. STRESS: "Is there anything in your life that is making you feel stressed or tense?"     no 5. PAIN: "Do you have any pain that is keeping you awake?" (e.g., back pain, headache, abdominal pain)     no 6. CAFFEINE ABUSE: "Do you drink caffeinated beverages, and how much each day?" (e.g., coffee, tea, colas)     Off of all caffeine 7. SUBSTANCE ABUSE: "Do you use any illegal drugs or alcohol?"     no 8. OTHER SYMPTOMS: "Do you have any other symptoms?"  (e.g., difficulty breathing)     no  Protocols used: INSOMNIA-A-AH

## 2018-03-29 ENCOUNTER — Other Ambulatory Visit: Payer: Self-pay

## 2018-03-29 ENCOUNTER — Telehealth: Payer: Self-pay | Admitting: Cardiovascular Disease

## 2018-03-29 DIAGNOSIS — L820 Inflamed seborrheic keratosis: Secondary | ICD-10-CM | POA: Diagnosis not present

## 2018-03-29 DIAGNOSIS — A630 Anogenital (venereal) warts: Secondary | ICD-10-CM | POA: Diagnosis not present

## 2018-03-29 MED ORDER — FUROSEMIDE 20 MG PO TABS: ORAL_TABLET | ORAL | 5 refills | Status: DC

## 2018-03-29 NOTE — Telephone Encounter (Signed)
Called and advised patient of note from MD, to increase to 80 mg daily, until weight gets to 140lb, then go back to 40 mg daily.   Patient daughter verbalized understanding. Needed new RX sent to pharmacy, sent in with new instructions to verified pharmacy

## 2018-03-29 NOTE — Telephone Encounter (Signed)
  Called and advised patient of note from MD, to increase to 80 mg daily, until weight gets to 140lb, then go back to 40 mg daily.   Patient daughter verbalized understanding. Needed new RX sent to pharmacy, sent in with new instructions to verified pharmacy

## 2018-03-29 NOTE — Telephone Encounter (Signed)
Let's try furosemide 80 mg daily until weight down to 140 lb (if she has already taken the 40 mg today, OK to take another 40 mg), then back to 40 mg daily. Please call us back on Friday with weight, symptoms, BP. Thanks [employer]

## 2018-03-29 NOTE — Telephone Encounter (Signed)
New message   Pt c/o swelling: STAT is pt has developed SOB within 24 hours  1) How much weight have you gained and in what time span? No   2) If swelling, where is the swelling located? Both feet  3) Are you currently taking a fluid pill? yes  4) Are you currently SOB? No   5) Do you have a log of your daily weights (if so, list)? Yes, can provide log if needed   6) Have you gained 3 pounds in a day or 5 pounds in a week? No   7) Have you traveled recently? Yes, [place], [place]

## 2018-03-29 NOTE — Telephone Encounter (Signed)
Patient daughter calls in, wants Dr.Croitoru to know that her mom has been taking the 40 mg lasix daily, but no changes in weight gain/weight loss, she is still having the swelling in her feet accompanied by pitting edema per daughter, denies any diet changes, she is elevating her feet during the day and at night, denies chest pains, and denies any changes in SOB nothing more than normal. Patient daughter would like to know if any other changes needed to be made. I advised I would sent a message to Doctor and Nurse, and will call back with recommendations. Daughter verbalized understanding.

## 2018-03-30 DIAGNOSIS — L03019 Cellulitis of unspecified finger: Secondary | ICD-10-CM | POA: Diagnosis not present

## 2018-04-03 ENCOUNTER — Ambulatory Visit (INDEPENDENT_AMBULATORY_CARE_PROVIDER_SITE_OTHER): Payer: Medicare HMO | Admitting: Family Medicine

## 2018-04-03 ENCOUNTER — Encounter: Payer: Self-pay | Admitting: Family Medicine

## 2018-04-03 VITALS — BP 122/66 | HR 65 | Temp 98.2°F | Ht 61.0 in | Wt 148.0 lb

## 2018-04-03 DIAGNOSIS — R0981 Nasal congestion: Secondary | ICD-10-CM

## 2018-04-03 DIAGNOSIS — Z23 Encounter for immunization: Secondary | ICD-10-CM | POA: Diagnosis not present

## 2018-04-03 NOTE — Patient Instructions (Addendum)
Flu shot today We will refer you to ENT for evaluation of left nasal obstruction.  Start taking flonase 2 sprays daily into both nostrils.

## 2018-04-03 NOTE — Assessment & Plan Note (Signed)
Chronic LEFT sided nasal congestion - unrevealing exam today Has stopped regular afrin use.  Recommended restart flonase regularly for next few weeks to see if any improvement. Consider singulair.  Continues nasal saline Will refer to ENT for eval. Pt/daughter agree with plan.

## 2018-04-03 NOTE — Progress Notes (Signed)
BP 122/66 (BP Location: Left Arm, Patient Position: Sitting, Cuff Size: Normal)   Pulse 65   Temp 98.2 F (36.8 C) (Oral)   Ht 5\' 1"  (1.549 m)   Wt 148 lb (67.1 kg)   SpO2 94%   BMI 27.96 kg/m    CC: discuss ENT referral Subjective:    Patient ID: Meredith Gutierrez, female    DOB: January 05, 1941, 77 y.o.   MRN: [id number]  HPI: Meredith Gutierrez is a 77 y.o. female presenting on 04/03/2018 for Referral (Requests referral for ENT. Pt accompanied by her daughter, [name].)   Just had finger surgery for infected hang nail.   Had been using afrin nasal spray for years. Afrin stopped 2-3 months ago.  Ongoing trouble with LEFT sided nasal obstruction, worse at night time. Treating with regular nasal saline use without much benefit.  She has also tried flonase nasal spray (but not regularly).  No nosebleeds.  No fevers/chills, headaches, sinus pain/pressure  Relevant past medical, surgical, family and social history reviewed and updated as indicated. Interim medical history since our last visit reviewed. Allergies and medications reviewed and updated. Outpatient Medications Prior to Visit  Medication Sig Dispense Refill  . albuterol (PROVENTIL HFA;VENTOLIN HFA) 108 (90 Base) MCG/ACT inhaler Inhale 1-2 puffs into the lungs every 6 (six) hours as needed for wheezing or shortness of breath. 1 Inhaler 3  . albuterol (PROVENTIL) (2.5 MG/3ML) 0.083% nebulizer solution Take 3 mLs (2.5 mg total) by nebulization every 6 (six) hours as needed for wheezing or shortness of breath. 75 mL 1  . aspirin EC 81 MG tablet Take 81 mg by mouth daily.    . candesartan (ATACAND) 16 MG tablet TAKE 1 TABLET BY MOUTH DAILY 30 tablet 6  . carboxymethylcellulose (REFRESH PLUS) 0.5 % SOLN Place 1 drop into both eyes 2 (two) times daily.    . Cholecalciferol (VITAMIN D) 2000 units CAPS Take 1 capsule (2,000 Units total) by mouth daily. 30 capsule   . diltiazem (CARDIZEM CD) 240 MG 24 hr capsule TAKE 1 CAPSULE BY MOUTH DAILY  90 capsule 3  . donepezil (ARICEPT) 5 MG tablet TAKE ONE TABLET BY MOUTH AT BEDTIME 30 tablet 3  . furosemide (LASIX) 20 MG tablet Take 4 tablets (80mg ) daily until weight gets to 140lb, then go to 2 tablets (40mg ) daily 360 tablet 5  . hydrOXYzine (ATARAX/VISTARIL) 10 MG tablet Take 2 tablets (20 mg total) by mouth at bedtime as needed for anxiety. 60 tablet 6  . morphine (MS CONTIN) 15 MG 12 hr tablet Take 1 tablet by mouth 2 (two) times daily.    Marland Kitchen morphine (MSIR) 15 MG tablet Take 1 tablet (15 mg total) by mouth every 6 (six) hours as needed for moderate pain.  0  . nicotine (NICODERM CQ - DOSED IN MG/24 HOURS) 14 mg/24hr patch Place 1 patch (14 mg total) onto the skin daily. 28 patch 1  . Tiotropium Bromide Monohydrate (SPIRIVA RESPIMAT) 1.25 MCG/ACT AERS Inhale 2 Act into the lungs at bedtime. 4 g 3  . oxymetazoline (AFRIN) 0.05 % nasal spray Place 2 sprays into both nostrils daily.     No facility-administered medications prior to visit.      Per HPI unless specifically indicated in ROS section below Review of Systems     Objective:    BP 122/66 (BP Location: Left Arm, Patient Position: Sitting, Cuff Size: Normal)   Pulse 65   Temp 98.2 F (36.8 C) (Oral)   Ht 5'  1" (1.549 m)   Wt 148 lb (67.1 kg)   SpO2 94%   BMI 27.96 kg/m   Wt Readings from Last 3 Encounters:  04/03/18 148 lb (67.1 kg)  03/16/18 150 lb (68 kg)  02/08/18 141 lb 4 oz (64.1 kg)    Physical Exam  Constitutional: She appears well-developed and well-nourished. No distress.  HENT:  Head: Normocephalic and atraumatic.  Right Ear: Hearing, tympanic membrane, external ear and ear canal normal.  Left Ear: Hearing, tympanic membrane, external ear and ear canal normal.  Nose: Mucosal edema (bilateral nasal mucosal injection) present. No rhinorrhea. Right sinus exhibits no maxillary sinus tenderness and no frontal sinus tenderness. Left sinus exhibits no maxillary sinus tenderness and no frontal sinus tenderness.    Mouth/Throat: Uvula is midline, oropharynx is clear and moist and mucous membranes are normal. No oropharyngeal exudate, posterior oropharyngeal edema, posterior oropharyngeal erythema or tonsillar abscesses.  Eyes: Pupils are equal, round, and reactive to light. Conjunctivae and EOM are normal. No scleral icterus.  Neck: Normal range of motion. Neck supple.  Lymphadenopathy:    She has no cervical adenopathy.  Skin: Skin is warm and dry. No rash noted.  Nursing note and vitals reviewed.     Assessment & Plan:   Problem List Items Addressed This Visit    Chronic nasal congestion - Primary    Chronic LEFT sided nasal congestion - unrevealing exam today Has stopped regular afrin use.  Recommended restart flonase regularly for next few weeks to see if any improvement. Consider singulair.  Continues nasal saline Will refer to ENT for eval. Pt/daughter agree with plan.       Relevant Orders   Ambulatory referral to ENT    Other Visit Diagnoses    Need for influenza vaccination       Relevant Orders   Flu Vaccine QUAD 36+ mos IM (Completed)       No orders of the defined types were placed in this encounter.  Orders Placed This Encounter  Procedures  . Flu Vaccine QUAD 36+ mos IM  . Ambulatory referral to ENT    Referral Priority:   Routine    Referral Type:   Consultation    Referral Reason:   Specialty Services Required    Requested Specialty:   Otolaryngology    Number of Visits Requested:   1    Follow up plan: No follow-ups on file.  Ria Bush, MD

## 2018-04-04 DIAGNOSIS — H02005 Unspecified entropion of left lower eyelid: Secondary | ICD-10-CM | POA: Diagnosis not present

## 2018-04-06 ENCOUNTER — Other Ambulatory Visit: Payer: Self-pay | Admitting: Family Medicine

## 2018-04-06 DIAGNOSIS — E0590 Thyrotoxicosis, unspecified without thyrotoxic crisis or storm: Secondary | ICD-10-CM

## 2018-04-06 DIAGNOSIS — Z79899 Other long term (current) drug therapy: Secondary | ICD-10-CM | POA: Diagnosis not present

## 2018-04-06 DIAGNOSIS — I5032 Chronic diastolic (congestive) heart failure: Secondary | ICD-10-CM | POA: Diagnosis not present

## 2018-04-06 DIAGNOSIS — E559 Vitamin D deficiency, unspecified: Secondary | ICD-10-CM

## 2018-04-06 DIAGNOSIS — E785 Hyperlipidemia, unspecified: Secondary | ICD-10-CM

## 2018-04-07 ENCOUNTER — Ambulatory Visit (INDEPENDENT_AMBULATORY_CARE_PROVIDER_SITE_OTHER): Payer: Medicare HMO

## 2018-04-07 ENCOUNTER — Ambulatory Visit: Payer: Medicare HMO

## 2018-04-07 VITALS — BP 104/60 | HR 40 | Temp 98.6°F | Ht 60.0 in | Wt 144.5 lb

## 2018-04-07 DIAGNOSIS — E785 Hyperlipidemia, unspecified: Secondary | ICD-10-CM

## 2018-04-07 DIAGNOSIS — E559 Vitamin D deficiency, unspecified: Secondary | ICD-10-CM

## 2018-04-07 DIAGNOSIS — E0590 Thyrotoxicosis, unspecified without thyrotoxic crisis or storm: Secondary | ICD-10-CM | POA: Diagnosis not present

## 2018-04-07 DIAGNOSIS — Z0000 Encounter for general adult medical examination without abnormal findings: Secondary | ICD-10-CM | POA: Insufficient documentation

## 2018-04-07 LAB — COMPREHENSIVE METABOLIC PANEL
ALT: 12 U/L (ref 0–35)
AST: 11 U/L (ref 0–37)
Albumin: 4.2 g/dL (ref 3.5–5.2)
Alkaline Phosphatase: 101 U/L (ref 39–117)
BILIRUBIN TOTAL: 0.4 mg/dL (ref 0.2–1.2)
BUN: 28 mg/dL — AB (ref 6–23)
CO2: 28 meq/L (ref 19–32)
CREATININE: 0.91 mg/dL (ref 0.40–1.20)
Calcium: 10 mg/dL (ref 8.4–10.5)
Chloride: 92 mEq/L — ABNORMAL LOW (ref 96–112)
GFR: 63.34 mL/min (ref >60.00)
GLUCOSE: 161 mg/dL — AB (ref 70–99)
Potassium: 3.9 mEq/L (ref 3.5–5.1)
Sodium: 132 mEq/L — ABNORMAL LOW (ref 135–145)
Total Protein: 7.6 g/dL (ref 6.0–8.3)

## 2018-04-07 LAB — BASIC METABOLIC PANEL
BUN/Creatinine Ratio: 37 — ABNORMAL HIGH (ref 12–28)
BUN: 27 mg/dL (ref 8–27)
CO2: 24 mmol/L (ref 20–29)
CREATININE: 0.73 mg/dL (ref 0.57–1.00)
Calcium: 9.5 mg/dL (ref 8.7–10.3)
Chloride: 94 mmol/L — ABNORMAL LOW (ref 96–106)
GFR calc Af Amer: 91 mL/min/{1.73_m2} (ref >59)
GFR, EST NON AFRICAN AMERICAN: 79 mL/min/{1.73_m2} (ref >59)
GLUCOSE: 284 mg/dL — AB (ref 65–99)
Potassium: 5.4 mmol/L — ABNORMAL HIGH (ref 3.5–5.2)
Sodium: 132 mmol/L — ABNORMAL LOW (ref 134–144)

## 2018-04-07 LAB — LDL CHOLESTEROL, DIRECT: Direct LDL: 122 mg/dL

## 2018-04-07 LAB — TSH: TSH: 1.77 u[IU]/mL (ref 0.35–4.50)

## 2018-04-07 LAB — LIPID PANEL
Cholesterol: 184 mg/dL (ref 0–200)
HDL: 36.4 mg/dL — ABNORMAL LOW (ref >39.00)
NONHDL: 147.8
Total CHOL/HDL Ratio: 5
Triglycerides: 286 mg/dL — ABNORMAL HIGH (ref 0.0–149.0)
VLDL: 57.2 mg/dL — ABNORMAL HIGH (ref 0.0–40.0)

## 2018-04-07 LAB — VITAMIN D 25 HYDROXY (VIT D DEFICIENCY, FRACTURES): VITD: 31.4 ng/mL (ref 30.00–100.00)

## 2018-04-07 NOTE — Progress Notes (Signed)
PCP notes:   Health maintenance:  No gaps identified.   Abnormal screenings:   Hearing - failed  Hearing Screening   125Hz  250Hz  500Hz  1000Hz  2000Hz  3000Hz  4000Hz  6000Hz  8000Hz   Right ear:   0 0 40  0    Left ear:   0 0 0  0     Patient concerns:   None  Nurse concerns:  None  Next PCP appt:   04/24/18 @ 1130

## 2018-04-07 NOTE — Progress Notes (Signed)
Subjective:   Meredith Gutierrez is a 77 y.o. female who presents for Medicare Annual (Subsequent) preventive examination.  Review of Systems:  N/A Cardiac Risk Factors include: advanced age (>31men, >69 women);smoking/ tobacco exposure;dyslipidemia;hypertension     Objective:     Vitals: BP 104/60 (BP Location: Right Arm, Patient Position: Sitting, Cuff Size: Normal)   Pulse (!) 40 Comment: taken pain medication but alert to self  Temp 98.6 F (37 C) (Oral)   Ht 5' (1.524 m) Comment: shoes  Wt 144 lb 8 oz (65.5 kg)   SpO2 98%   BMI 28.22 kg/m   Body mass index is 28.22 kg/m.  Advanced Directives 04/07/2018 04/06/2017 02/20/2013 02/20/2013 02/16/2013 08/09/2011 08/06/2011  Does Patient Have a Medical Advance Directive? Yes No;Yes Patient does not have advance directive;Patient would not like information - Patient does not have advance directive Patient does not have advance directive Patient does not have advance directive  Type of Advance Directive [place];Living will [place];Living will - - - - -  Copy of [place] in Chart? Yes Yes - - - - -  Pre-existing out of facility DNR order (yellow form or pink MOST form) - - No No No No No    Tobacco Social History   Tobacco Use  Smoking Status Former Smoker  . Packs/day: 0.10  . Years: 20.00  . Pack years: 2.00  . Types: Cigarettes  . Last attempt to quit: 09/14/2017  . Years since quitting: 0.5  Smokeless Tobacco Never Used  Tobacco Comment   smoking cessation info given     Counseling given: No Comment: smoking cessation info given   Clinical Intake:  Pre-visit preparation completed: Yes  Pain Score: 8 (back)     Nutritional Status: BMI 25 -29 Overweight Nutritional Risks: None Diabetes: No  How often do you need to have someone help you when you read instructions, pamphlets, or other written materials from your doctor or pharmacy?: 4 - Often What is the  last grade level you completed in school?: 12th grade  Interpreter Needed?: No  Comments: pt is a widow and lives with daughter [name] Information entered by :: [employer], LPN  Past Medical History:  Diagnosis Date  . Angina    never needed to take nitroglycerin.  . Anxiety   . Blood transfusion without reported diagnosis   . Cerebral atherosclerosis   . COPD (chronic obstructive pulmonary disease) ([place])    02-16-13-"pt. denies this"-shows on CXR of 2- 2013.  Marland Kitchen Coronary artery disease 2003   s/p CABG 2014  . Diastolic CHF, on admit treated. 08/10/2011  . Fibromuscular dysplasia of renal artery ([facility])    right  . Heart murmur   . History of kidney stones 02-16-13   "hx. of overgrowth of muscle-causing some stricture"renal artery stenosis-being followed [place]  . Hyperlipidemia   . Hypertension   . Hyperthyroidism    [place]  . Melanoma ([place]) 2010   skin cancers, 1 melanoma removed back  . Osteoarthritis    Past Surgical History:  Procedure Laterality Date  . ABDOMINAL HYSTERECTOMY  1970   heavy bleeding  . APPENDECTOMY  1960s  . CARDIAC CATHETERIZATION  08/09/2011   no sign. coronary obstruction. LIMA is atretic w/excellent flow down the native LAD  . CARDIAC CATHETERIZATION  08/22/2001   no sign. coronary stenosis,  . CATARACT EXTRACTION, BILATERAL Bilateral   . CESAREAN SECTION    . CORONARY ARTERY BYPASS GRAFT  02-16-13  3'03-no bypasses -Had 2 aneurysm repairs(stents used)  . LAPAROTOMY N/A 02/20/2013   Procedure: EXPLORATORY LAPAROTOMY;  [name], MD  . LEFT HEART CATHETERIZATION WITH CORONARY ANGIOGRAM N/A 08/09/2011   Procedure: LEFT HEART CATHETERIZATION WITH CORONARY ANGIOGRAM;  Surgeon: Sanda Klein, MD;  Location: [facility];  Service: Cardiovascular;  Laterality: N/A;  . NM MYOCAR PERF WALL MOTION  07/20/2010   normal  . SALPINGOOPHORECTOMY Bilateral 02/20/2013   benign seromucinous cystadenofibroma R ovary [name], MD)  . TOOTH EXTRACTION    . TOTAL HIP  ARTHROPLASTY Left 2015   Family History  Problem Relation Age of Onset  . Stroke Mother   . Hypotension Mother   . Dementia Mother 18  . Coronary artery disease Father   . Hypertension Father   . Breast cancer Maternal Aunt   . Hypotension Sister   . Hypertension Brother   . Diabetes Brother   . Lung cancer Daughter   . Thyroid cancer Daughter    Social History   Socioeconomic History  . Marital status: Widowed    Spouse name: Not on file  . Number of children: Not on file  . Years of education: Not on file  . Highest education level: Not on file  Occupational History  . Not on file  Social Needs  . Financial resource strain: Not on file  . Food insecurity:    Worry: Not on file    Inability: Not on file  . Transportation needs:    Medical: Not on file    Non-medical: Not on file  Tobacco Use  . Smoking status: Former Smoker    Packs/day: 0.10    Years: 20.00    Pack years: 2.00    Types: Cigarettes    Last attempt to quit: 09/14/2017    Years since quitting: 0.5  . Smokeless tobacco: Never Used  . Tobacco comment: smoking cessation info given  Substance and Sexual Activity  . Alcohol use: No  . Drug use: No  . Sexual activity: Never  Lifestyle  . Physical activity:    Days per week: Not on file    Minutes per session: Not on file  . Stress: Not on file  Relationships  . Social connections:    Talks on phone: Not on file    Gets together: Not on file    Attends religious service: Not on file    Active member of club or organization: Not on file    Attends meetings of clubs or organizations: Not on file    Relationship status: Not on file  Other Topics Concern  . Not on file  Social History Narrative   Widow   Lives with daughter and daughter's GF   Edu: HS   Occ: retired, worked at [employer]   Activity: no regular exercise   Diet: good water     Outpatient Encounter Medications as of 04/07/2018  Medication Sig  . albuterol (PROVENTIL HFA;VENTOLIN  HFA) 108 (90 Base) MCG/ACT inhaler Inhale 1-2 puffs into the lungs every 6 (six) hours as needed for wheezing or shortness of breath.  Marland Kitchen albuterol (PROVENTIL) (2.5 MG/3ML) 0.083% nebulizer solution Take 3 mLs (2.5 mg total) by nebulization every 6 (six) hours as needed for wheezing or shortness of breath.  Marland Kitchen aspirin EC 81 MG tablet Take 81 mg by mouth daily.  . candesartan (ATACAND) 16 MG tablet TAKE 1 TABLET BY MOUTH DAILY  . carboxymethylcellulose (REFRESH PLUS) 0.5 % SOLN Place 1 drop into both eyes 2 (  two) times daily.  . Cholecalciferol (VITAMIN D) 2000 units CAPS Take 1 capsule (2,000 Units total) by mouth daily.  Marland Kitchen diltiazem (CARDIZEM CD) 240 MG 24 hr capsule TAKE 1 CAPSULE BY MOUTH DAILY  . donepezil (ARICEPT) 5 MG tablet TAKE ONE TABLET BY MOUTH AT BEDTIME  . furosemide (LASIX) 20 MG tablet Take 4 tablets (80mg ) daily until weight gets to 140lb, then go to 2 tablets (40mg ) daily  . hydrOXYzine (ATARAX/VISTARIL) 10 MG tablet Take 2 tablets (20 mg total) by mouth at bedtime as needed for anxiety.  Marland Kitchen morphine (MS CONTIN) 15 MG 12 hr tablet Take 1 tablet by mouth 2 (two) times daily.  Marland Kitchen morphine (MSIR) 15 MG tablet Take 1 tablet (15 mg total) by mouth every 6 (six) hours as needed for moderate pain.  . nicotine (NICODERM CQ - DOSED IN MG/24 HOURS) 14 mg/24hr patch Place 1 patch (14 mg total) onto the skin daily.  . Tiotropium Bromide Monohydrate (SPIRIVA RESPIMAT) 1.25 MCG/ACT AERS Inhale 2 Act into the lungs at bedtime.   No facility-administered encounter medications on file as of 04/07/2018.     Activities of Daily Living In your present state of health, do you have any difficulty performing the following activities: 04/07/2018  Hearing? Y  Vision? N  Difficulty concentrating or making decisions? Y  Walking or climbing stairs? Y  Dressing or bathing? N  Doing errands, shopping? Y  Preparing Food and eating ? Y  Using the Toilet? N  In the past six months, have you accidently  leaked urine? N  Do you have problems with loss of bowel control? N  Managing your Medications? Y  Managing your Finances? Y  Housekeeping or managing your Housekeeping? Y  Some recent data might be hidden    Patient Care Team: Ria Bush, MD as PCP - General (Family Medicine) Croitoru, Dani Gobble, MD as PCP - Cardiology (Cardiology)    Assessment:   This is a routine wellness examination for Meredith Gutierrez.   Hearing Screening   125Hz  250Hz  500Hz  1000Hz  2000Hz  3000Hz  4000Hz  6000Hz  8000Hz   Right ear:   0 0 40  0    Left ear:   0 0 0  0    Vision Screening Comments: Vision exam in 2019 with Dr. Satira Sark  Exercise Activities and Dietary recommendations Current Exercise Habits: Home exercise routine, Type of exercise: walking, Time (Minutes): 15, Frequency (Times/Week): 7, Weekly Exercise (Minutes/Week): 105, Intensity: Mild, Exercise limited by: None identified  Goals    . Increase physical activity     Starting 04/07/2018, I will continue to walk at least 15 min daily.        Fall Risk Fall Risk  04/07/2018 04/06/2017  Falls in the past year? No No   Depression Screen PHQ 2/9 Scores 04/07/2018 04/06/2017  PHQ - 2 Score 0 0  PHQ- 9 Score 0 0     Cognitive Function DX: dementia MMSE - Mini Mental State Exam 04/07/2018 04/06/2017  Not completed: (No Data) -  Orientation to time - 5  Orientation to Place - 5  Registration - 3  Attention/ Calculation - 0  Recall - 0  Recall-comments - unable to recall 3 of 3 words  Language- name 2 objects - 0  Language- repeat - 1  Language- follow 3 step command - 1  Language- follow 3 step command-comments - unable to follow 2 steps of 3 step command  Language- read & follow direction - 0  Write a sentence - 0  Copy design - 0  Total score - 15        Immunization History  Administered Date(s) Administered  . Influenza,inj,Quad PF,6+ Mos 04/06/2017, 04/03/2018  . Pneumococcal Conjugate-13 12/02/2014  . Pneumococcal  Polysaccharide-23 06/27/2008  . Zoster 04/16/2014    Screening Tests Health Maintenance  Topic Date Due  . DTaP/Tdap/Td (1 - Tdap) 04/07/2027 (Originally 01/03/1960)  . TETANUS/TDAP  04/07/2027 (Originally 01/03/1960)  . INFLUENZA VACCINE  Completed  . DEXA SCAN  Completed  . PNA vac Low Risk Adult  Completed      Plan:     I have personally reviewed, addressed, and noted the following in the patient's chart:  A. Medical and social history B. Use of alcohol, tobacco or illicit drugs  C. Current medications and supplements D. Functional ability and status E.  Nutritional status F.  Physical activity G. Advance directives H. List of other physicians I.  Hospitalizations, surgeries, and ER visits in previous 12 months J.  [place] to include hearing, vision, cognitive, depression L. Referrals and appointments - none  In addition, I have reviewed and discussed with patient certain preventive protocols, quality metrics, and best practice recommendations. A written personalized care plan for preventive services as well as general preventive health recommendations were provided to patient.  See attached scanned questionnaire for additional information.   Signed,   Lindell Noe, MHA, BS, LPN Health Coach

## 2018-04-07 NOTE — Patient Instructions (Addendum)
Meredith Gutierrez , Thank you for taking time to come for your Medicare Wellness Visit. I appreciate your ongoing commitment to your health goals. Please review the following plan we discussed and let me know if I can assist you in the future.   These are the goals we discussed: Goals    . Increase physical activity     Starting 04/07/2018, I will continue to walk at least 15 minutes daily.        This is a list of the screening recommended for you and due dates:  Health Maintenance  Topic Date Due  . DTaP/Tdap/Td vaccine (1 - Tdap) 04/07/2027*  . Tetanus Vaccine  04/07/2027*  . Flu Shot  Completed  . DEXA scan (bone density measurement)  Completed  . Pneumonia vaccines  Completed  *Topic was postponed. The date shown is not the original due date.   Preventive Care for Adults  A healthy lifestyle and preventive care can promote health and wellness. Preventive health guidelines for adults include the following key practices.  . A routine yearly physical is a good way to check with your health care provider about your health and preventive screening. It is a chance to share any concerns and updates on your health and to receive a thorough exam.  . Visit your dentist for a routine exam and preventive care every 6 months. Brush your teeth twice a day and floss once a day. Good oral hygiene prevents tooth decay and gum disease.  . The frequency of eye exams is based on your age, health, family medical history, use  of contact lenses, and other factors. Follow your health care provider's recommendations for frequency of eye exams.  . Eat a healthy diet. Foods like vegetables, fruits, whole grains, low-fat dairy products, and lean protein foods contain the nutrients you need without too many calories. Decrease your intake of foods high in solid fats, added sugars, and salt. Eat the right amount of calories for you. Get information about a proper diet from your health care provider, if  necessary.  . Regular physical exercise is one of the most important things you can do for your health. Most adults should get at least 150 minutes of moderate-intensity exercise (any activity that increases your heart rate and causes you to sweat) each week. In addition, most adults need muscle-strengthening exercises on 2 or more days a week.  Silver Sneakers may be a benefit available to you. To determine eligibility, you may visit the website: www.silversneakers.com or contact program at 5484743589 Mon-Fri between 8AM-8PM.   . Maintain a healthy weight. The body mass index (BMI) is a screening tool to identify possible weight problems. It provides an estimate of body fat based on height and weight. Your health care provider can find your BMI and can help you achieve or maintain a healthy weight.   For adults 20 years and older: ? A BMI below 18.5 is considered underweight. ? A BMI of 18.5 to 24.9 is normal. ? A BMI of 25 to 29.9 is considered overweight. ? A BMI of 30 and above is considered obese.   . Maintain normal blood lipids and cholesterol levels by exercising and minimizing your intake of saturated fat. Eat a balanced diet with plenty of fruit and vegetables. Blood tests for lipids and cholesterol should begin at age 80 and be repeated every 5 years. If your lipid or cholesterol levels are high, you are over 50, or you are at high risk for heart disease,  you may need your cholesterol levels checked more frequently. Ongoing high lipid and cholesterol levels should be treated with medicines if diet and exercise are not working.  . If you smoke, find out from your health care provider how to quit. If you do not use tobacco, please do not start.  . If you choose to drink alcohol, please do not consume more than 2 drinks per day. One drink is considered to be 12 ounces (355 mL) of beer, 5 ounces (148 mL) of wine, or 1.5 ounces (44 mL) of liquor.  . If you are 70-24 years old, ask your  health care provider if you should take aspirin to prevent strokes.  . Use sunscreen. Apply sunscreen liberally and repeatedly throughout the day. You should seek shade when your shadow is shorter than you. Protect yourself by wearing long sleeves, pants, a wide-brimmed hat, and sunglasses year round, whenever you are outdoors.  . Once a month, do a whole body skin exam, using a mirror to look at the skin on your back. Tell your health care provider of new moles, moles that have irregular borders, moles that are larger than a pencil eraser, or moles that have changed in shape or color.

## 2018-04-08 ENCOUNTER — Other Ambulatory Visit: Payer: Self-pay | Admitting: Endocrinology

## 2018-04-08 DIAGNOSIS — E0590 Thyrotoxicosis, unspecified without thyrotoxic crisis or storm: Secondary | ICD-10-CM

## 2018-04-10 ENCOUNTER — Other Ambulatory Visit: Payer: Medicare HMO

## 2018-04-10 DIAGNOSIS — A630 Anogenital (venereal) warts: Secondary | ICD-10-CM | POA: Diagnosis not present

## 2018-04-10 NOTE — Progress Notes (Signed)
I reviewed health advisor's note, was available for consultation, and agree with documentation and plan.  

## 2018-04-11 ENCOUNTER — Telehealth: Payer: Self-pay | Admitting: *Deleted

## 2018-04-11 DIAGNOSIS — I5032 Chronic diastolic (congestive) heart failure: Secondary | ICD-10-CM

## 2018-04-11 DIAGNOSIS — I10 Essential (primary) hypertension: Secondary | ICD-10-CM

## 2018-04-11 DIAGNOSIS — R0981 Nasal congestion: Secondary | ICD-10-CM | POA: Diagnosis not present

## 2018-04-11 MED ORDER — CANDESARTAN CILEXETIL 16 MG PO TABS: 8.0000 mg | ORAL_TABLET | Freq: Every day | ORAL | 6 refills | Status: DC

## 2018-04-11 NOTE — Telephone Encounter (Signed)
Patient daughter returning our call ° °Please call back ° °

## 2018-04-11 NOTE — Telephone Encounter (Signed)
Patient's daughter made aware of results and verbalized her understanding. Candesartan has been changed to 8 mg daily and the patient will come back in three weeks for a BMET. Orders have been placed.

## 2018-04-11 NOTE — Telephone Encounter (Signed)
Left a message for the patient to call back.  

## 2018-04-11 NOTE — Telephone Encounter (Signed)
-----   Message from Sanda Klein, MD sent at 04/07/2018 10:55 AM EDT ----- Please cut back candesartan to 8 mg daily and recheck BMET in 3 weeks. Potassium is high. Please confirm that she is NO taking K supplements.

## 2018-04-12 ENCOUNTER — Ambulatory Visit: Payer: Medicare HMO | Admitting: Nurse Practitioner

## 2018-04-12 ENCOUNTER — Encounter: Payer: Self-pay | Admitting: Nurse Practitioner

## 2018-04-12 VITALS — BP 108/78 | HR 78 | Ht 60.0 in | Wt 148.0 lb

## 2018-04-12 DIAGNOSIS — R1310 Dysphagia, unspecified: Secondary | ICD-10-CM | POA: Diagnosis not present

## 2018-04-12 NOTE — Patient Instructions (Signed)
If you are age 79 or older, your body mass index should be between 23-30. Your Body mass index is 28.9 kg/m. If this is out of the aforementioned range listed, please consider follow up with your Primary Care Provider.  If you are age 28 or younger, your body mass index should be between 19-25. Your Body mass index is 28.9 kg/m. If this is out of the aformentioned range listed, please consider follow up with your Primary Care Provider.   You have been scheduled for an endoscopy. Please follow written instructions given to you at your visit today. If you use inhalers (even only as needed), please bring them with you on the day of your procedure. Your physician has requested that you go to www.startemmi.com and enter the access code given to you at your visit today. This web site gives a general overview about your procedure. However, you should still follow specific instructions given to you by our office regarding your preparation for the procedure.  Thank you for choosing me and [facility].   Tye Savoy, NP

## 2018-04-12 NOTE — Progress Notes (Signed)
Agree with assessment and plan as outlined.  

## 2018-04-12 NOTE — Progress Notes (Signed)
Chief Complaint:   Worsening swallowing problems  IMPRESSION and PLAN:    10. 77 yo F with solid food dysphagia.  Daughter feels there has been some progression since I saw her late April.  No strictures on barium swallow with tablet, just frequent tertiary contractions/spasms and stasis.  They declined EGD at the time -Patient and daughter would now like to proceed with EGD with dilation. The risks and benefits of EGD with dilation were discussed and the patient agrees to proceed.  -Advised patient to eat small bites, chew well with liquids in between bites to avoid food impaction.  2. Hx of hyperthyroidism, s/p RAI. Thyroid goiter with bilateral hypodense nodules measuring up to 2.0 cm seen on CTA chest late June.  For u/s of neck soon.  -Unlikely contributing to dysphagia, no extrinsic compressions mentioned on barium swallow  HPI:     Patient is a 77 year old female known to Dr. Havery Moros. She has multiple medical problems not limited to CAD/CABG/ diastolic dysfunction / COPD, and chronic pain requiring narcotics. I saw her late April for evaluation of dysphasia without associated weight loss.  Barium swallow with tablet suggested dysmotility with frequent tertiary contractions/spasms and stasis.  Tablet passed easily.  Comfort EGD with empirical dilation but this was declined.  Advised patient to eat slow, take small bites of food with fluid in between each bite.  Daughter would monitor patient and let us know if symptoms progress  Patient and her daughter have returned with complaints of worsening dysphagia.  She swallows liquids okay but solids, especially bread, meat and medications get stuck.  Daughter cuts food into small pieces.  Patient has asked daughter to hit her on her back at times to facilitate passage of pills.  No odynophagia.  No weight loss, weight is up actually.  No other GI complaints.  Data reviewed:  04/07/2018 CMP Na+ slightly low at 132 BUN elevated 28,  creatinine normal 0.91 liver test normal   Review of systems:     No chest pain, no SOB, no fevers, no urinary sx   Past Medical History:  Diagnosis Date  . Angina    never needed to take nitroglycerin.  . Anxiety   . Blood transfusion without reported diagnosis   . Cerebral atherosclerosis   . COPD (chronic obstructive pulmonary disease) ([place])    02-16-13-"pt. denies this"-shows on CXR of 2- 2013.  Marland Kitchen Coronary artery disease 2003   s/p CABG 2014  . Diastolic CHF, on admit treated. 08/10/2011  . Fibromuscular dysplasia of renal artery ([facility])    right  . Heart murmur   . History of kidney stones 02-16-13   "hx. of overgrowth of muscle-causing some stricture"renal artery stenosis-being followed [place]  . Hyperlipidemia   . Hypertension   . Hyperthyroidism    Balan  . Melanoma ([place]) 2010   skin cancers, 1 melanoma removed back  . Osteoarthritis     Patient's surgical history, family medical history, social history, medications and allergies were all reviewed in Epic   Serum creatinine: 0.91 mg/dL 04/07/18 0841 Estimated creatinine clearance: 42.8 mL/min  Current Outpatient Medications  Medication Sig Dispense Refill  . albuterol (PROVENTIL HFA;VENTOLIN HFA) 108 (90 Base) MCG/ACT inhaler Inhale 1-2 puffs into the lungs every 6 (six) hours as needed for wheezing or shortness of breath. 1 Inhaler 3  . albuterol (PROVENTIL) (2.5 MG/3ML) 0.083% nebulizer solution Take 3 mLs (2.5 mg total) by nebulization every 6 (six) hours as needed for wheezing  or shortness of breath. 75 mL 1  . aspirin EC 81 MG tablet Take 81 mg by mouth daily.    . candesartan (ATACAND) 16 MG tablet Take 0.5 tablets (8 mg total) by mouth daily. 30 tablet 6  . carboxymethylcellulose (REFRESH PLUS) 0.5 % SOLN Place 1 drop into both eyes 2 (two) times daily.    . Cholecalciferol (VITAMIN D) 2000 units CAPS Take 1 capsule (2,000 Units total) by mouth daily. 30 capsule   . diltiazem (CARDIZEM CD) 240 MG 24 hr capsule TAKE  1 CAPSULE BY MOUTH DAILY 90 capsule 3  . donepezil (ARICEPT) 5 MG tablet TAKE ONE TABLET BY MOUTH AT BEDTIME 30 tablet 3  . furosemide (LASIX) 20 MG tablet Take 4 tablets (80mg ) daily until weight gets to 140lb, then go to 2 tablets (40mg ) daily 360 tablet 5  . hydrOXYzine (ATARAX/VISTARIL) 10 MG tablet Take 2 tablets (20 mg total) by mouth at bedtime as needed for anxiety. 60 tablet 6  . morphine (MS CONTIN) 15 MG 12 hr tablet Take 1 tablet by mouth 2 (two) times daily.    Marland Kitchen morphine (MSIR) 15 MG tablet Take 1 tablet (15 mg total) by mouth every 6 (six) hours as needed for moderate pain.  0  . nicotine (NICODERM CQ - DOSED IN MG/24 HOURS) 14 mg/24hr patch Place 1 patch (14 mg total) onto the skin daily. 28 patch 1  . Tiotropium Bromide Monohydrate (SPIRIVA RESPIMAT) 1.25 MCG/ACT AERS Inhale 2 Act into the lungs at bedtime. 4 g 3   No current facility-administered medications for this visit.     Physical Exam:     BP 108/78   Pulse 78   Ht 5' (1.524 m)   Wt 148 lb (67.1 kg)   BMI 28.90 kg/m   GENERAL:  Pleasant female sitting in chair in NAD PSYCH: : Cooperative, normal affect EENT:  conjunctiva pink, mucous membranes moist, neck supple without masses. No oral candida CARDIAC:  RRR, no murmur heard, no peripheral edema PULM: Normal respiratory effort, lungs CTA bilaterally, no wheezing ABDOMEN:  Nondistended, soft, nontender. Normal bowel sounds SKIN:  turgor, no lesions seen Musculoskeletal:  Normal muscle tone, normal strength NEURO: Alert and oriented x 3, no focal neurologic deficits   Tye Savoy , NP 04/12/2018, 11:06 AM

## 2018-04-13 ENCOUNTER — Ambulatory Visit: Payer: Medicare HMO | Admitting: Cardiology

## 2018-04-13 ENCOUNTER — Encounter: Payer: Self-pay | Admitting: Cardiology

## 2018-04-13 ENCOUNTER — Ambulatory Visit: Payer: Medicare HMO | Admitting: Physician Assistant

## 2018-04-13 VITALS — BP 118/58 | HR 74 | Ht 62.0 in | Wt 148.0 lb

## 2018-04-13 DIAGNOSIS — I5033 Acute on chronic diastolic (congestive) heart failure: Secondary | ICD-10-CM

## 2018-04-13 DIAGNOSIS — E785 Hyperlipidemia, unspecified: Secondary | ICD-10-CM | POA: Diagnosis not present

## 2018-04-13 DIAGNOSIS — F0150 Vascular dementia without behavioral disturbance: Secondary | ICD-10-CM

## 2018-04-13 DIAGNOSIS — Z951 Presence of aortocoronary bypass graft: Secondary | ICD-10-CM

## 2018-04-13 MED ORDER — METOLAZONE 2.5 MG PO TABS: 2.5000 mg | ORAL_TABLET | Freq: Every day | ORAL | 3 refills | Status: DC

## 2018-04-13 NOTE — Progress Notes (Signed)
I agree, but would also advise stopping the metolazone as soon as weight hits 140 lb, to avoid excessive hypovolemia if zaroxylyn works quicker than expected. Sometimes one dose is enough and she may not need 3 consecutive doses. Thanks [employer]

## 2018-04-13 NOTE — Assessment & Plan Note (Addendum)
Dr Sallyanne Kuster has suggested a goal weight of 140 lbs. Pt is 148 lbs despite increasing Lasix. She is not impressively overloaded on exam but the history of orthopnea is concerning. Will continue to push for a goal weight of 140 lbs.

## 2018-04-13 NOTE — Progress Notes (Signed)
04/13/2018 Meredith Gutierrez   11-09-1940  [id number]  Primary Physician Ria Bush, MD Primary Cardiologist: Dr Sallyanne Kuster  HPI: Meredith Gutierrez is a pleasant 77 year old female followed by Dr. Sallyanne Kuster with a history of coronary disease.  She had bypass grafting off pump in 2003.  She had a catheterization in 2013.  She has diastolic dysfunction.  She has moderate LVH by echo.  She has a history of dementia, she lives in her daughter's basement.  She no longer drives.  She has baseline bradycardia and is unable to tolerate beta-blockers.  She has dyslipidemia and is intolerant to statins.  Dr. Sallyanne Kuster does not think she is a PCSK9 candidate.  The summer according to the daughter the patient began having lower extremity edema and increasing shortness of breath and orthopnea.  Her weight drifted up from 140 pounds to close 250 pounds.  She was recently seen by Dr. Sallyanne Kuster and her diuretics were adjusted.  They were adjusted again later as an outpatient to 80 mg a day although the daughter is only giving her 40 mg twice daily.  The patient is seen in the office today for follow-up.  The patient's daughter says she has not lost much weight, she still running 148 pounds.  She still has trace edema.  On exam volume overload is not too impressive but the patient's daughter does say that her mother has trouble with shortness of breath when she lays flat which is new for her.  Lab work was done 04/07/2018 on the increased Lasix dose.  Her sodium is 132 potassium 3.9 BUN 28 creatinine 0.91.   Current Outpatient Medications  Medication Sig Dispense Refill  . albuterol (PROVENTIL HFA;VENTOLIN HFA) 108 (90 Base) MCG/ACT inhaler Inhale 1-2 puffs into the lungs every 6 (six) hours as needed for wheezing or shortness of breath. 1 Inhaler 3  . albuterol (PROVENTIL) (2.5 MG/3ML) 0.083% nebulizer solution Take 3 mLs (2.5 mg total) by nebulization every 6 (six) hours as needed for wheezing or shortness of breath.  75 mL 1  . aspirin EC 81 MG tablet Take 81 mg by mouth daily.    . candesartan (ATACAND) 16 MG tablet Take 0.5 tablets (8 mg total) by mouth daily. 30 tablet 6  . carboxymethylcellulose (REFRESH PLUS) 0.5 % SOLN Place 1 drop into both eyes 2 (two) times daily.    . Cholecalciferol (VITAMIN D) 2000 units CAPS Take 1 capsule (2,000 Units total) by mouth daily. 30 capsule   . diltiazem (CARDIZEM CD) 240 MG 24 hr capsule TAKE 1 CAPSULE BY MOUTH DAILY 90 capsule 3  . donepezil (ARICEPT) 5 MG tablet TAKE ONE TABLET BY MOUTH AT BEDTIME 30 tablet 3  . furosemide (LASIX) 20 MG tablet Take 4 tablets (80mg ) daily until weight gets to 140lb, then go to 2 tablets (40mg ) daily 360 tablet 5  . hydrOXYzine (ATARAX/VISTARIL) 10 MG tablet Take 2 tablets (20 mg total) by mouth at bedtime as needed for anxiety. 60 tablet 6  . morphine (MS CONTIN) 15 MG 12 hr tablet Take 1 tablet by mouth 2 (two) times daily.    Marland Kitchen morphine (MSIR) 15 MG tablet Take 1 tablet (15 mg total) by mouth every 6 (six) hours as needed for moderate pain.  0  . nicotine (NICODERM CQ - DOSED IN MG/24 HOURS) 14 mg/24hr patch Place 1 patch (14 mg total) onto the skin daily. 28 patch 1  . Tiotropium Bromide Monohydrate (SPIRIVA RESPIMAT) 1.25 MCG/ACT AERS Inhale 2 Act into the  lungs at bedtime. 4 g 3  . metolazone (ZAROXOLYN) 2.5 MG tablet Take 1 tablet (2.5 mg total) by mouth daily. 90 tablet 3   No current facility-administered medications for this visit.     Allergies  Allergen Reactions  . Codeine Anaphylaxis and Shortness Of Breath    Shortness of breath  . Penicillin G Shortness Of Breath and Rash  . Penicillins Anaphylaxis and Shortness Of Breath    Has patient had a PCN reaction causing immediate rash, facial/tongue/throat swelling, SOB or lightheadedness with hypotension: YES Has patient had a PCN reaction causing severe rash involving mucus membranes or skin necrosis: NO Has patient had a PCN reaction that required hospitalization:  NO Has patient had a PCN reaction occurring within the last 10 years: NO If all of the above answers are "NO", then may proceed with Cephalosporin use.  . Prednisolone Acetate Nausea Only and Shortness Of Breath  . Meloxicam Hives  . Nitroglycerin   . Crestor [Rosuvastatin Calcium] Other (See Comments)    Weakness with statins  . Lopressor [Metoprolol Tartrate] Other (See Comments)    Bradycardia   . Prednisone Hives and Swelling    Shortness of breath  . Sulfamethoxazole Hives and Swelling  . Zetia [Ezetimibe] Other (See Comments)    Weakness     Past Medical History:  Diagnosis Date  . Angina    never needed to take nitroglycerin.  . Anxiety   . Blood transfusion without reported diagnosis   . Cerebral atherosclerosis   . COPD (chronic obstructive pulmonary disease) ([place])    02-16-13-"pt. denies this"-shows on CXR of 2- 2013.  Marland Kitchen Coronary artery disease 2003   s/p CABG 2014  . Diastolic CHF, on admit treated. 08/10/2011  . Fibromuscular dysplasia of renal artery ([facility])    right  . Heart murmur   . History of kidney stones 02-16-13   "hx. of overgrowth of muscle-causing some stricture"renal artery stenosis-being followed [place]  . Hyperlipidemia   . Hypertension   . Hyperthyroidism    Balan  . Melanoma ([place]) 2010   skin cancers, 1 melanoma removed back  . Osteoarthritis     Social History   Socioeconomic History  . Marital status: Widowed    Spouse name: Not on file  . Number of children: Not on file  . Years of education: Not on file  . Highest education level: Not on file  Occupational History  . Not on file  Social Needs  . Financial resource strain: Not on file  . Food insecurity:    Worry: Not on file    Inability: Not on file  . Transportation needs:    Medical: Not on file    Non-medical: Not on file  Tobacco Use  . Smoking status: Former Smoker    Packs/day: 0.10    Years: 20.00    Pack years: 2.00    Types: Cigarettes    Last attempt to quit:  09/14/2017    Years since quitting: 0.5  . Smokeless tobacco: Never Used  . Tobacco comment: smoking cessation info given  Substance and Sexual Activity  . Alcohol use: No  . Drug use: No  . Sexual activity: Never  Lifestyle  . Physical activity:    Days per week: Not on file    Minutes per session: Not on file  . Stress: Not on file  Relationships  . Social connections:    Talks on phone: Not on file    Gets together: Not on file  Attends religious service: Not on file    Active member of club or organization: Not on file    Attends meetings of clubs or organizations: Not on file    Relationship status: Not on file  . Intimate partner violence:    Fear of current or ex partner: Not on file    Emotionally abused: Not on file    Physically abused: Not on file    Forced sexual activity: Not on file  Other Topics Concern  . Not on file  Social History Narrative   Widow   Lives with daughter and daughter's GF   Edu: HS   Occ: retired, worked at [employer]   Activity: no regular exercise   Diet: good water      Family History  Problem Relation Age of Onset  . Stroke Mother   . Hypotension Mother   . Dementia Mother 57  . Coronary artery disease Father   . Hypertension Father   . Breast cancer Maternal Aunt   . Hypotension Sister   . Hypertension Brother   . Diabetes Brother   . Lung cancer Daughter   . Thyroid cancer Daughter      Review of Systems: General: negative for chills, fever, night sweats or weight changes.  Cardiovascular: negative for chest pain,palpitations, paroxysmal nocturnal dyspnea Dermatological: negative for rash Respiratory: negative for cough or wheezing Urologic: negative for hematuria Abdominal: negative for nausea, vomiting, diarrhea, bright red blood per rectum, melena, or hematemesis Neurologic: negative for visual changes, syncope, or dizziness All other systems reviewed and are otherwise negative except as noted above.    Blood  pressure (!) 118/58, pulse 74, height 5\' 2"  (1.575 m), weight 148 lb (67.1 kg), SpO2 98 %.  General appearance: alert, cooperative, no distress and mildly obese Neck: no JVD Lungs: clear to auscultation bilaterally Heart: regular rate and rhythm Extremities: trace-1+ LE edema Skin: warm and dry Neurologic: Grossly normal  Echo March 2019- Study Conclusions  - Left ventricle: The cavity size was normal. There was mild   concentric hypertrophy. Systolic function was normal. The   estimated ejection fraction was in the range of 55% to 60%. Wall   motion was normal; there were no regional wall motion   abnormalities. Doppler parameters are consistent with abnormal   left ventricular relaxation (grade 1 diastolic dysfunction). - Aortic valve: Trileaflet; moderately thickened, severely   calcified leaflets. Transvalvular velocity was within the normal   range. There was no stenosis.   ASSESSMENT AND PLAN:   Acute on chronic diastolic heart failure [facility] [address]) Dr Sallyanne Kuster has suggested a goal weight of 140 lbs. Pt is 148 lbs despite increasing Lasix. She is not impressively overloaded on exam but the history of orthopnea is concerning. Will continue to push for a goal weight of 140 lbs.    Dementia No longer drives. Daughter helps manage medications  Dyslipidemia, statin intol. LDL 122 Oct 2019- not a candidate for PCSK9  Hx of CABG CABG 08/24/2001, off pump, LIMA to LAD, SVG to 1st diagonal. Ligation of LAD artery aneurysm. Cath done in 2013   PLAN  I added metolazone 2.5 mg daily x 3 days and suggested she take the Lasix 80 mg all at one. I have asked her to return for a BMP, BNP, and office visit in 1-2 weeks.   Kerin Ransom PA-C 04/13/2018 3:38 PM

## 2018-04-13 NOTE — Assessment & Plan Note (Signed)
LDL 122 Oct 2019- not a candidate for PCSK9

## 2018-04-13 NOTE — Assessment & Plan Note (Signed)
No longer drives. Daughter helps manage medications

## 2018-04-13 NOTE — Assessment & Plan Note (Signed)
CABG 08/24/2001, off pump, LIMA to LAD, SVG to 1st diagonal. Ligation of LAD artery aneurysm. Cath done in 2013

## 2018-04-13 NOTE — Patient Instructions (Signed)
Medication Instructions:  START Zaroxolyn 2.5mg  Take 1 tablet for every morning for 3 days If you need a refill on your cardiac medications before your next appointment, please call your pharmacy.   Lab work: Your physician recommends that you return for lab work in: TODAY-BMET, BNP IN 1 WEEK If you have labs (blood work) drawn today and your tests are completely normal, you will receive your results only by: Marland Kitchen MyChart Message (if you have MyChart) OR . A paper copy in the mail If you have any lab test that is abnormal or we need to change your treatment, we will call you to review the results.  Testing/Procedures: NONE   Follow-Up: At [facility], you and your health needs are our priority.  As part of our continuing mission to provide you with exceptional heart care, we have created designated Provider Care Teams.  These Care Teams include your primary Cardiologist (physician) and Advanced Practice Providers (APPs -  Physician Assistants and Nurse Practitioners) who all work together to provide you with the care you need, when you need it. . Your physician recommends that you schedule a follow-up appointment in: [place].  Any Other Special Instructions Will Be Listed Below (If Applicable).

## 2018-04-14 ENCOUNTER — Encounter: Payer: Self-pay | Admitting: Gastroenterology

## 2018-04-14 ENCOUNTER — Encounter: Payer: Medicare HMO | Admitting: Family Medicine

## 2018-04-14 DIAGNOSIS — E119 Type 2 diabetes mellitus without complications: Secondary | ICD-10-CM

## 2018-04-14 DIAGNOSIS — E871 Hypo-osmolality and hyponatremia: Secondary | ICD-10-CM

## 2018-04-14 HISTORY — DX: Hypo-osmolality and hyponatremia: E87.1

## 2018-04-14 HISTORY — PX: ESOPHAGOGASTRODUODENOSCOPY: SHX1529

## 2018-04-14 HISTORY — DX: Type 2 diabetes mellitus without complications: E11.9

## 2018-04-18 ENCOUNTER — Encounter: Payer: Medicare HMO | Admitting: Family Medicine

## 2018-04-18 ENCOUNTER — Telehealth: Payer: Self-pay

## 2018-04-18 DIAGNOSIS — I5033 Acute on chronic diastolic (congestive) heart failure: Secondary | ICD-10-CM | POA: Diagnosis not present

## 2018-04-18 NOTE — Telephone Encounter (Signed)
[name] called back; the med is Hydroxyzine 10 mg and pt has been taking 2 tabs at bedtime. Pt is also taking OTC  Melatonin(Carol not sure of mg) and pt still not able to rest at night. [name] request cb after Dr Gutierrez's review.

## 2018-04-18 NOTE — Progress Notes (Deleted)
Cardiology Office Note   Date:  04/18/2018   ID:  [name], [name] 11-04-1940, MRN [id number]  PCP:  Meredith Bush, MD  Cardiologist: Dr.Croitoru  No chief complaint on file.    History of Present Illness: Meredith Gutierrez is a 77 y.o. female who presents for ongoing assessment and management of CAD. Hx of off pump CABG in 2003, baseline bradycardia, unable to tolerate BB, dementia, lives with her daughter. Was last seen by Meredith Gutierrez, Meredith Gutierrez on 04/13/2018 with daughter reporting that her breathing status was worsening, with symptoms of PND. Dr. Sallyanne Kuster recommended goal weight of 140 lbs, with weight of 148 lbs despite increasing lasix. She did not appear to be volume overloaded on that visit.     Past Medical History:  Diagnosis Date  . Angina    never needed to take nitroglycerin.  . Anxiety   . Blood transfusion without reported diagnosis   . Cerebral atherosclerosis   . COPD (chronic obstructive pulmonary disease) ([place])    02-16-13-"pt. denies this"-shows on CXR of 2- 2013.  Marland Kitchen Coronary artery disease 2003   s/p CABG 2014  . Diastolic CHF, on admit treated. 08/10/2011  . Fibromuscular dysplasia of renal artery ([facility])    right  . Heart murmur   . History of kidney stones 02-16-13   "hx. of overgrowth of muscle-causing some stricture"renal artery stenosis-being followed [facility]  . Hyperlipidemia   . Hypertension   . Hyperthyroidism    Balan  . Melanoma ([place]) 2010   skin cancers, 1 melanoma removed back  . Osteoarthritis     Past Surgical History:  Procedure Laterality Date  . ABDOMINAL HYSTERECTOMY  1970   heavy bleeding  . APPENDECTOMY  1960s  . CARDIAC CATHETERIZATION  08/09/2011   no sign. coronary obstruction. LIMA is atretic w/excellent flow down the native LAD  . CARDIAC CATHETERIZATION  08/22/2001   no sign. coronary stenosis,  . CATARACT EXTRACTION, BILATERAL Bilateral   . CESAREAN SECTION    . CORONARY ARTERY BYPASS GRAFT  02-16-13   3'03-no bypasses -Had 2  aneurysm repairs(stents used)  . LAPAROTOMY N/A 02/20/2013   Procedure: EXPLORATORY LAPAROTOMY;  [name], MD  . LEFT HEART CATHETERIZATION WITH CORONARY ANGIOGRAM N/A 08/09/2011   Procedure: LEFT HEART CATHETERIZATION WITH CORONARY ANGIOGRAM;  Surgeon: Sanda Klein, MD;  Location: [facility] CATH [facility];  Service: Cardiovascular;  Laterality: N/A;  . NM MYOCAR PERF WALL MOTION  07/20/2010   normal  . SALPINGOOPHORECTOMY Bilateral 02/20/2013   benign seromucinous cystadenofibroma R ovary [name], MD)  . TOOTH EXTRACTION    . TOTAL HIP ARTHROPLASTY Left 2015     Current Outpatient Medications  Medication Sig Dispense Refill  . albuterol (PROVENTIL HFA;VENTOLIN HFA) 108 (90 Base) MCG/ACT inhaler Inhale 1-2 puffs into the lungs every 6 (six) hours as needed for wheezing or shortness of breath. 1 Inhaler 3  . albuterol (PROVENTIL) (2.5 MG/3ML) 0.083% nebulizer solution Take 3 mLs (2.5 mg total) by nebulization every 6 (six) hours as needed for wheezing or shortness of breath. 75 mL 1  . aspirin EC 81 MG tablet Take 81 mg by mouth daily.    . candesartan (ATACAND) 16 MG tablet Take 0.5 tablets (8 mg total) by mouth daily. 30 tablet 6  . carboxymethylcellulose (REFRESH PLUS) 0.5 % SOLN Place 1 drop into both eyes 2 (two) times daily.    . Cholecalciferol (VITAMIN D) 2000 units CAPS Take 1 capsule (2,000 Units total) by mouth daily. 30 capsule   .  diltiazem (CARDIZEM CD) 240 MG 24 hr capsule TAKE 1 CAPSULE BY MOUTH DAILY 90 capsule 3  . donepezil (ARICEPT) 5 MG tablet TAKE ONE TABLET BY MOUTH AT BEDTIME 30 tablet 3  . furosemide (LASIX) 20 MG tablet Take 4 tablets (80mg ) daily until weight gets to 140lb, then go to 2 tablets (40mg ) daily 360 tablet 5  . hydrOXYzine (ATARAX/VISTARIL) 10 MG tablet Take 2 tablets (20 mg total) by mouth at bedtime as needed for anxiety. 60 tablet 6  . metolazone (ZAROXOLYN) 2.5 MG tablet Take 1 tablet (2.5 mg total) by mouth daily. 90 tablet 3  . morphine (MS CONTIN)  15 MG 12 hr tablet Take 1 tablet by mouth 2 (two) times daily.    Marland Kitchen morphine (MSIR) 15 MG tablet Take 1 tablet (15 mg total) by mouth every 6 (six) hours as needed for moderate pain.  0  . nicotine (NICODERM CQ - DOSED IN MG/24 HOURS) 14 mg/24hr patch Place 1 patch (14 mg total) onto the skin daily. 28 patch 1  . Tiotropium Bromide Monohydrate (SPIRIVA RESPIMAT) 1.25 MCG/ACT AERS Inhale 2 Act into the lungs at bedtime. 4 g 3   No current facility-administered medications for this visit.     Allergies:   Codeine; Penicillin g; Penicillins; Prednisolone acetate; Meloxicam; Nitroglycerin; Crestor [rosuvastatin calcium]; Lopressor [metoprolol tartrate]; Prednisone; Sulfamethoxazole; and Zetia [ezetimibe]    Social History:  The patient  reports that she quit smoking about 7 months ago. Her smoking use included cigarettes. She has a 2.00 pack-year smoking history. She has never used smokeless tobacco. She reports that she does not drink alcohol or use drugs.   Family History:  The patient's family history includes Breast cancer in her maternal aunt; Coronary artery disease in her father; Dementia (age of onset: 79) in her mother; Diabetes in her brother; Hypertension in her brother and father; Hypotension in her mother and sister; Lung cancer in her daughter; Stroke in her mother; Thyroid cancer in her daughter.    ROS: All other systems are reviewed and negative. Unless otherwise mentioned in H&P    PHYSICAL EXAM: VS:  There were no vitals taken for this visit. , BMI There is no height or weight on file to calculate BMI. GEN: Well nourished, well developed, in no acute distress HEENT: normal Neck: no JVD, carotid bruits, or masses Cardiac: ***RRR; no murmurs, rubs, or gallops,no edema  Respiratory:  Clear to auscultation bilaterally, normal work of breathing GI: soft, nontender, nondistended, + BS MS: no deformity or atrophy Skin: warm and dry, no rash Neuro:  Strength and sensation are  intact Psych: euthymic mood, full affect   EKG:  EKG {ACTION; IS/IS [id number]} ordered today. The ekg ordered today demonstrates ***   Recent Labs: 12/10/2017: B Natriuretic Peptide 76.8; Hemoglobin 14.3; Platelets 311 04/07/2018: ALT 12; BUN 28; Creatinine, Ser 0.91; Potassium 3.9; Sodium 132; TSH 1.77    Lipid Panel    Component Value Date/Time   CHOL 184 04/07/2018 0841   TRIG 286.0 (H) 04/07/2018 0841   HDL 36.40 (L) 04/07/2018 0841   CHOLHDL 5 04/07/2018 0841   VLDL 57.2 (H) 04/07/2018 0841   LDLCALC 123 02/06/2016   LDLDIRECT 122.0 04/07/2018 0841      Wt Readings from Last 3 Encounters:  04/13/18 148 lb (67.1 kg)  04/12/18 148 lb (67.1 kg)  04/07/18 144 lb 8 oz (65.5 kg)      Other studies Reviewed: Additional studies/ records that were reviewed today include: ***. Review of  the above records demonstrates: ***   ASSESSMENT AND PLAN:  1.  ***   Current medicines are reviewed at length with the patient today.    Labs/ tests ordered today include: *** Phill Myron. West Pugh, ANP, AACC   04/18/2018 1:04 PM    [facility] [place] [address] Office [phone] Fax [phone]

## 2018-04-18 NOTE — Telephone Encounter (Signed)
[name], pts daughter (DPR signed) left v/m that the med pt is taking to help her sleep has stopped working. For the last 2 - 3 nights pt may have slept a total of 6 hrs.[name] is requesting a different med to help pt sleep. I was unable to reach [facility] by phone to ask name of med pt is taking for sleep. ? Hydroxyzine? Last seen for acute on 04/03/18.

## 2018-04-19 ENCOUNTER — Other Ambulatory Visit: Payer: Self-pay

## 2018-04-19 ENCOUNTER — Telehealth: Payer: Self-pay

## 2018-04-19 ENCOUNTER — Inpatient Hospital Stay (HOSPITAL_COMMUNITY)
Admission: EM | Admit: 2018-04-19 | Discharge: 2018-04-21 | DRG: 641 | Disposition: A | Discharge status: Home Health | Payer: Medicare HMO | Attending: Family Medicine | Admitting: Family Medicine

## 2018-04-19 ENCOUNTER — Telehealth: Payer: Self-pay | Admitting: Cardiology

## 2018-04-19 ENCOUNTER — Encounter (HOSPITAL_COMMUNITY): Payer: Self-pay | Admitting: Emergency Medicine

## 2018-04-19 ENCOUNTER — Ambulatory Visit: Payer: Medicare HMO | Admitting: Adult Health

## 2018-04-19 DIAGNOSIS — I10 Essential (primary) hypertension: Secondary | ICD-10-CM | POA: Diagnosis not present

## 2018-04-19 DIAGNOSIS — F03918 Unspecified dementia, unspecified severity, with other behavioral disturbance: Secondary | ICD-10-CM | POA: Diagnosis present

## 2018-04-19 DIAGNOSIS — I773 Arterial fibromuscular dysplasia: Secondary | ICD-10-CM | POA: Diagnosis present

## 2018-04-19 DIAGNOSIS — N179 Acute kidney failure, unspecified: Secondary | ICD-10-CM | POA: Diagnosis not present

## 2018-04-19 DIAGNOSIS — I672 Cerebral atherosclerosis: Secondary | ICD-10-CM | POA: Diagnosis present

## 2018-04-19 DIAGNOSIS — I5032 Chronic diastolic (congestive) heart failure: Secondary | ICD-10-CM | POA: Diagnosis not present

## 2018-04-19 DIAGNOSIS — E785 Hyperlipidemia, unspecified: Secondary | ICD-10-CM | POA: Diagnosis present

## 2018-04-19 DIAGNOSIS — I130 Hypertensive heart and chronic kidney disease with heart failure and stage 1 through stage 4 chronic kidney disease, or unspecified chronic kidney disease: Secondary | ICD-10-CM | POA: Diagnosis not present

## 2018-04-19 DIAGNOSIS — E559 Vitamin D deficiency, unspecified: Secondary | ICD-10-CM | POA: Diagnosis not present

## 2018-04-19 DIAGNOSIS — G4700 Insomnia, unspecified: Secondary | ICD-10-CM | POA: Diagnosis present

## 2018-04-19 DIAGNOSIS — F05 Delirium due to known physiological condition: Secondary | ICD-10-CM | POA: Diagnosis not present

## 2018-04-19 DIAGNOSIS — Z888 Allergy status to other drugs, medicaments and biological substances status: Secondary | ICD-10-CM

## 2018-04-19 DIAGNOSIS — R1319 Other dysphagia: Secondary | ICD-10-CM

## 2018-04-19 DIAGNOSIS — H9190 Unspecified hearing loss, unspecified ear: Secondary | ICD-10-CM | POA: Diagnosis present

## 2018-04-19 DIAGNOSIS — Z87891 Personal history of nicotine dependence: Secondary | ICD-10-CM

## 2018-04-19 DIAGNOSIS — Z808 Family history of malignant neoplasm of other organs or systems: Secondary | ICD-10-CM

## 2018-04-19 DIAGNOSIS — E871 Hypo-osmolality and hyponatremia: Principal | ICD-10-CM | POA: Diagnosis present

## 2018-04-19 DIAGNOSIS — J449 Chronic obstructive pulmonary disease, unspecified: Secondary | ICD-10-CM

## 2018-04-19 DIAGNOSIS — N181 Chronic kidney disease, stage 1: Secondary | ICD-10-CM | POA: Diagnosis present

## 2018-04-19 DIAGNOSIS — E1122 Type 2 diabetes mellitus with diabetic chronic kidney disease: Secondary | ICD-10-CM | POA: Diagnosis not present

## 2018-04-19 DIAGNOSIS — I701 Atherosclerosis of renal artery: Secondary | ICD-10-CM | POA: Diagnosis present

## 2018-04-19 DIAGNOSIS — F0150 Vascular dementia without behavioral disturbance: Secondary | ICD-10-CM | POA: Diagnosis not present

## 2018-04-19 DIAGNOSIS — Z87442 Personal history of urinary calculi: Secondary | ICD-10-CM

## 2018-04-19 DIAGNOSIS — I878 Other specified disorders of veins: Secondary | ICD-10-CM | POA: Diagnosis present

## 2018-04-19 DIAGNOSIS — Z801 Family history of malignant neoplasm of trachea, bronchus and lung: Secondary | ICD-10-CM | POA: Diagnosis not present

## 2018-04-19 DIAGNOSIS — G8929 Other chronic pain: Secondary | ICD-10-CM | POA: Diagnosis present

## 2018-04-19 DIAGNOSIS — F0391 Unspecified dementia with behavioral disturbance: Secondary | ICD-10-CM | POA: Diagnosis not present

## 2018-04-19 DIAGNOSIS — H9193 Unspecified hearing loss, bilateral: Secondary | ICD-10-CM | POA: Diagnosis not present

## 2018-04-19 DIAGNOSIS — Z803 Family history of malignant neoplasm of breast: Secondary | ICD-10-CM

## 2018-04-19 DIAGNOSIS — Z951 Presence of aortocoronary bypass graft: Secondary | ICD-10-CM

## 2018-04-19 DIAGNOSIS — T502X5A Adverse effect of carbonic-anhydrase inhibitors, benzothiadiazides and other diuretics, initial encounter: Secondary | ICD-10-CM | POA: Diagnosis present

## 2018-04-19 DIAGNOSIS — Z882 Allergy status to sulfonamides status: Secondary | ICD-10-CM

## 2018-04-19 DIAGNOSIS — Z833 Family history of diabetes mellitus: Secondary | ICD-10-CM

## 2018-04-19 DIAGNOSIS — Z90710 Acquired absence of both cervix and uterus: Secondary | ICD-10-CM

## 2018-04-19 DIAGNOSIS — Z8249 Family history of ischemic heart disease and other diseases of the circulatory system: Secondary | ICD-10-CM

## 2018-04-19 DIAGNOSIS — R1314 Dysphagia, pharyngoesophageal phase: Secondary | ICD-10-CM | POA: Diagnosis present

## 2018-04-19 DIAGNOSIS — Z880 Allergy status to penicillin: Secondary | ICD-10-CM

## 2018-04-19 DIAGNOSIS — Z823 Family history of stroke: Secondary | ICD-10-CM

## 2018-04-19 DIAGNOSIS — F0390 Unspecified dementia without behavioral disturbance: Secondary | ICD-10-CM | POA: Diagnosis present

## 2018-04-19 DIAGNOSIS — I2510 Atherosclerotic heart disease of native coronary artery without angina pectoris: Secondary | ICD-10-CM | POA: Diagnosis present

## 2018-04-19 DIAGNOSIS — Z818 Family history of other mental and behavioral disorders: Secondary | ICD-10-CM

## 2018-04-19 DIAGNOSIS — Z85820 Personal history of malignant melanoma of skin: Secondary | ICD-10-CM

## 2018-04-19 DIAGNOSIS — R0981 Nasal congestion: Secondary | ICD-10-CM | POA: Diagnosis present

## 2018-04-19 DIAGNOSIS — E876 Hypokalemia: Secondary | ICD-10-CM | POA: Diagnosis not present

## 2018-04-19 DIAGNOSIS — Z8679 Personal history of other diseases of the circulatory system: Secondary | ICD-10-CM

## 2018-04-19 DIAGNOSIS — Z885 Allergy status to narcotic agent status: Secondary | ICD-10-CM

## 2018-04-19 DIAGNOSIS — R1310 Dysphagia, unspecified: Secondary | ICD-10-CM

## 2018-04-19 DIAGNOSIS — Z96642 Presence of left artificial hip joint: Secondary | ICD-10-CM | POA: Diagnosis present

## 2018-04-19 DIAGNOSIS — R0602 Shortness of breath: Secondary | ICD-10-CM | POA: Diagnosis not present

## 2018-04-19 DIAGNOSIS — M545 Low back pain: Secondary | ICD-10-CM | POA: Diagnosis present

## 2018-04-19 HISTORY — DX: Hypo-osmolality and hyponatremia: E87.1

## 2018-04-19 HISTORY — DX: Type 2 diabetes mellitus without complications: E11.9

## 2018-04-19 HISTORY — DX: Family history of other specified conditions: Z84.89

## 2018-04-19 LAB — BASIC METABOLIC PANEL
Anion gap: 16 — ABNORMAL HIGH (ref 5–15)
Anion gap: 15 (ref 5–15)
BUN/Creatinine Ratio: 50 — ABNORMAL HIGH (ref 12–28)
BUN: 46 mg/dL — ABNORMAL HIGH (ref 8–27)
BUN: 47 mg/dL — AB (ref 8–23)
BUN: 40 mg/dL — ABNORMAL HIGH (ref 8–23)
CALCIUM: 8.9 mg/dL (ref 8.9–10.3)
CALCIUM: 8.8 mg/dL — AB (ref 8.9–10.3)
CHLORIDE: 76 mmol/L — AB (ref 98–111)
CO2: 24 mmol/L (ref 20–29)
CO2: 29 mmol/L (ref 22–32)
CO2: 25 mmol/L (ref 22–32)
CREATININE: 0.91 mg/dL (ref 0.44–1.00)
Calcium: 9.1 mg/dL (ref 8.7–10.3)
Chloride: 74 mmol/L — CL (ref 96–106)
Chloride: 72 mmol/L — ABNORMAL LOW (ref 98–111)
Creatinine, Ser: 0.92 mg/dL (ref 0.57–1.00)
Creatinine, Ser: 1.06 mg/dL — ABNORMAL HIGH (ref 0.44–1.00)
GFR calc Af Amer: 68 mL/min/{1.73_m2} (ref >59)
GFR calc Af Amer: 56 mL/min — ABNORMAL LOW (ref >60)
GFR calc non Af Amer: 59 mL/min/{1.73_m2} — ABNORMAL LOW (ref >59)
GFR calc non Af Amer: 58 mL/min — ABNORMAL LOW (ref >60)
GFR, EST NON AFRICAN AMERICAN: 49 mL/min — AB (ref >60)
Glucose, Bld: 297 mg/dL — ABNORMAL HIGH (ref 70–99)
Glucose, Bld: 257 mg/dL — ABNORMAL HIGH (ref 70–99)
Glucose: 238 mg/dL — ABNORMAL HIGH (ref 65–99)
POTASSIUM: 2.4 mmol/L — AB (ref 3.5–5.1)
Potassium: 3.1 mmol/L — ABNORMAL LOW (ref 3.5–5.2)
Potassium: 3.1 mmol/L — ABNORMAL LOW (ref 3.5–5.1)
Sodium: 118 mmol/L — CL (ref 134–144)
Sodium: 117 mmol/L — CL (ref 135–145)
Sodium: 116 mmol/L — CL (ref 135–145)

## 2018-04-19 LAB — URINALYSIS, ROUTINE W REFLEX MICROSCOPIC
BILIRUBIN URINE: NEGATIVE
Glucose, UA: 150 mg/dL — AB
Hgb urine dipstick: NEGATIVE
Ketones, ur: NEGATIVE mg/dL
Nitrite: NEGATIVE
PROTEIN: NEGATIVE mg/dL
SPECIFIC GRAVITY, URINE: 1.005 (ref 1.005–1.030)
pH: 6 (ref 5.0–8.0)

## 2018-04-19 LAB — TSH: TSH: 0.463 u[IU]/mL (ref 0.350–4.500)

## 2018-04-19 LAB — CBC WITH DIFFERENTIAL/PLATELET
Abs Immature Granulocytes: 0.07 10*3/uL (ref 0.00–0.07)
BASOS ABS: 0 10*3/uL (ref 0.0–0.1)
Basophils Relative: 0 %
Eosinophils Absolute: 0.1 10*3/uL (ref 0.0–0.5)
Eosinophils Relative: 1 %
HCT: 35.3 % — ABNORMAL LOW (ref 36.0–46.0)
HEMOGLOBIN: 12.4 g/dL (ref 12.0–15.0)
IMMATURE GRANULOCYTES: 1 %
LYMPHS PCT: 13 %
Lymphs Abs: 1.3 10*3/uL (ref 0.7–4.0)
MCH: 28.2 pg (ref 26.0–34.0)
MCHC: 35.1 g/dL (ref 30.0–36.0)
MCV: 80.2 fL (ref 80.0–100.0)
Monocytes Absolute: 0.8 10*3/uL (ref 0.1–1.0)
Monocytes Relative: 8 %
NEUTROS ABS: 8.3 10*3/uL — AB (ref 1.7–7.7)
NEUTROS PCT: 77 %
PLATELETS: 319 10*3/uL (ref 150–400)
RBC: 4.4 MIL/uL (ref 3.87–5.11)
RDW: 12 % (ref 11.5–15.5)
WBC: 10.6 10*3/uL — AB (ref 4.0–10.5)
nRBC: 0 % (ref 0.0–0.2)

## 2018-04-19 LAB — OSMOLALITY, URINE: Osmolality, Ur: 239 mOsm/kg — ABNORMAL LOW (ref 300–900)

## 2018-04-19 LAB — PRO B NATRIURETIC PEPTIDE: NT-Pro BNP: 235 pg/mL (ref 0–738)

## 2018-04-19 LAB — NA AND K (SODIUM & POTASSIUM), RAND UR
POTASSIUM UR: 18 mmol/L
Sodium, Ur: 63 mmol/L

## 2018-04-19 LAB — OSMOLALITY: OSMOLALITY: 263 mosm/kg — AB (ref 275–295)

## 2018-04-19 LAB — CHLORIDE, URINE, RANDOM: CHLORIDE URINE: 68 mmol/L

## 2018-04-19 LAB — MAGNESIUM: MAGNESIUM: 1.9 mg/dL (ref 1.7–2.4)

## 2018-04-19 MED ORDER — HYDROXYZINE HCL 10 MG PO TABS
20.0000 mg | ORAL_TABLET | Freq: Every evening | ORAL | Status: DC | PRN
  Administered 2018-04-19: 20 mg via ORAL
  Filled 2018-04-19 (×2): qty 2

## 2018-04-19 MED ORDER — LORATADINE 10 MG PO TABS: 10.0000 mg | ORAL_TABLET | Freq: Every day | ORAL | Status: DC

## 2018-04-19 MED ORDER — ONDANSETRON HCL 4 MG/2ML IJ SOLN: 4.0000 mg | Freq: Four times a day (QID) | INTRAMUSCULAR | Status: DC | PRN

## 2018-04-19 MED ORDER — DONEPEZIL HCL 5 MG PO TABS: 5.0000 mg | ORAL_TABLET | Freq: Every day | ORAL | Status: DC

## 2018-04-19 MED ORDER — MORPHINE SULFATE 15 MG PO TABS
15.0000 mg | ORAL_TABLET | Freq: Four times a day (QID) | ORAL | Status: DC
  Administered 2018-04-19 – 2018-04-21 (×3): 15 mg via ORAL
  Filled 2018-04-19 (×3): qty 1

## 2018-04-19 MED ORDER — ENOXAPARIN SODIUM 40 MG/0.4ML ~~LOC~~ SOLN
40.0000 mg | SUBCUTANEOUS | Status: DC
  Administered 2018-04-19 – 2018-04-20 (×2): 40 mg via SUBCUTANEOUS
  Filled 2018-04-19 (×2): qty 0.4

## 2018-04-19 MED ORDER — ONDANSETRON HCL 4 MG PO TABS: 4.0000 mg | ORAL_TABLET | Freq: Four times a day (QID) | ORAL | Status: DC | PRN

## 2018-04-19 MED ORDER — CARBOXYMETHYLCELLULOSE SODIUM 0.5 % OP SOLN: 1.0000 [drp] | Freq: Two times a day (BID) | OPHTHALMIC | Status: DC

## 2018-04-19 MED ORDER — LORAZEPAM 2 MG/ML IJ SOLN
0.5000 mg | Freq: Once | INTRAMUSCULAR | Status: AC
  Administered 2018-04-19: 0.5 mg via INTRAVENOUS
  Filled 2018-04-19: qty 1

## 2018-04-19 MED ORDER — SODIUM CHLORIDE 3 % IV SOLN
INTRAVENOUS | Status: DC
  Administered 2018-04-19: 50 mL/h via INTRAVENOUS
  Filled 2018-04-19 (×3): qty 500

## 2018-04-19 MED ORDER — IRBESARTAN 75 MG PO TABS
37.5000 mg | ORAL_TABLET | Freq: Every day | ORAL | Status: DC
  Administered 2018-04-21: 37.5 mg via ORAL
  Filled 2018-04-19 (×2): qty 0.5

## 2018-04-19 MED ORDER — NICOTINE 14 MG/24HR TD PT24
14.0000 mg | MEDICATED_PATCH | Freq: Every day | TRANSDERMAL | Status: DC
  Filled 2018-04-19 (×2): qty 1

## 2018-04-19 MED ORDER — ALBUTEROL SULFATE (2.5 MG/3ML) 0.083% IN NEBU: 2.5000 mg | INHALATION_SOLUTION | Freq: Four times a day (QID) | RESPIRATORY_TRACT | Status: DC | PRN

## 2018-04-19 MED ORDER — MELATONIN 10 MG PO TABS: 20.0000 mg | ORAL_TABLET | Freq: Every day | ORAL | Status: DC

## 2018-04-19 MED ORDER — POLYVINYL ALCOHOL 1.4 % OP SOLN
1.0000 [drp] | Freq: Two times a day (BID) | OPHTHALMIC | Status: DC
  Administered 2018-04-21: 1 [drp] via OPHTHALMIC
  Filled 2018-04-19: qty 15

## 2018-04-19 MED ORDER — POTASSIUM CHLORIDE 10 MEQ/100ML IV SOLN
INTRAVENOUS | Status: AC
  Administered 2018-04-19: 10 meq
  Filled 2018-04-19: qty 100

## 2018-04-19 MED ORDER — LORATADINE 10 MG PO TABS
10.0000 mg | ORAL_TABLET | Freq: Every day | ORAL | Status: DC
  Administered 2018-04-21: 10 mg via ORAL
  Filled 2018-04-19: qty 1

## 2018-04-19 MED ORDER — UMECLIDINIUM BROMIDE 62.5 MCG/INH IN AEPB
1.0000 | INHALATION_SPRAY | Freq: Every day | RESPIRATORY_TRACT | Status: DC
  Filled 2018-04-19: qty 7

## 2018-04-19 MED ORDER — MELATONIN 3 MG PO TABS
21.0000 mg | ORAL_TABLET | Freq: Every day | ORAL | Status: DC
  Administered 2018-04-19: 21 mg via ORAL
  Filled 2018-04-19: qty 7

## 2018-04-19 MED ORDER — ALBUTEROL SULFATE HFA 108 (90 BASE) MCG/ACT IN AERS
1.0000 | INHALATION_SPRAY | Freq: Four times a day (QID) | RESPIRATORY_TRACT | Status: DC | PRN
  Filled 2018-04-19: qty 6.7

## 2018-04-19 MED ORDER — POTASSIUM CHLORIDE 10 MEQ/100ML IV SOLN
10.0000 meq | INTRAVENOUS | Status: AC
  Administered 2018-04-19 (×4): 10 meq via INTRAVENOUS
  Filled 2018-04-19 (×3): qty 100

## 2018-04-19 MED ORDER — ASPIRIN EC 81 MG PO TBEC
81.0000 mg | DELAYED_RELEASE_TABLET | Freq: Every day | ORAL | Status: DC
  Administered 2018-04-20 – 2018-04-21 (×2): 81 mg via ORAL
  Filled 2018-04-19 (×2): qty 1

## 2018-04-19 MED ORDER — POTASSIUM CHLORIDE 10 MEQ/100ML IV SOLN
INTRAVENOUS | Status: AC
  Administered 2018-04-19: 10 meq via INTRAVENOUS
  Filled 2018-04-19: qty 100

## 2018-04-19 MED ORDER — POTASSIUM CHLORIDE CRYS ER 20 MEQ PO TBCR
40.0000 meq | EXTENDED_RELEASE_TABLET | Freq: Once | ORAL | Status: AC
  Administered 2018-04-19: 40 meq via ORAL
  Filled 2018-04-19: qty 2

## 2018-04-19 MED ORDER — TIOTROPIUM BROMIDE MONOHYDRATE 1.25 MCG/ACT IN AERS: 2.0000 | INHALATION_SPRAY | Freq: Every day | RESPIRATORY_TRACT | Status: DC

## 2018-04-19 MED ORDER — MORPHINE SULFATE ER 15 MG PO TBCR
15.0000 mg | EXTENDED_RELEASE_TABLET | Freq: Two times a day (BID) | ORAL | Status: DC
  Administered 2018-04-19 – 2018-04-21 (×3): 15 mg via ORAL
  Filled 2018-04-19 (×3): qty 1

## 2018-04-19 MED ORDER — VITAMIN D 25 MCG (1000 UNIT) PO TABS
2000.0000 [IU] | ORAL_TABLET | Freq: Every day | ORAL | Status: DC
  Administered 2018-04-21: 2000 [IU] via ORAL
  Filled 2018-04-19: qty 5

## 2018-04-19 MED ORDER — VITAMIN D 50 MCG (2000 UT) PO CAPS: 1.0000 | ORAL_CAPSULE | Freq: Every day | ORAL | Status: DC

## 2018-04-19 NOTE — Consult Note (Signed)
[facility] Consult Note     Date: 04/19/2018                  Patient Name:  Meredith Gutierrez  MRN: [id number]  DOB: July 12, 1940  Age / Sex: 77 y.o., female         PCP: Ria Bush, MD                 Service Requesting Consult: ED                 Reason for Consult: Hyponatremia/hypokalemia/AKI            Chief Complaint: AMS reported by caregiver HPI:  Patient is 77 y/o woman with full time care and baseline mild dementia who presents to ED after caregivers reported change in mental status.  Hx includes CHF, dementia, HTN, renal artery stenosis, hx of tobacco abuse, hyperthyroidism, chronic pain on morhpine, asthma.  Most relevant recent includes increase in diuretics 10/3 from 3x weekly 65m lasix to 41mlasix daily (which was supposed to be stopped when she reached dry weight of 140lbs) and then increased to 8054masix daily on 10/16.  On 10/31 she was also given metolazone 2.5mg50mhich was supposed to only be for 3 days.  These changes were made due to complaints of increased swelling in legs, weight consistently staying about dry weight of 140 and compaint of increasing shortness of breath with activity.  Patient has not felt "sick" and says the medicine did make them urinate a lot but didn't seem to "solve" the swelling.  On interview in ED, daughter described that they were on metolazone daily long term and recently told to start taking 4 tabs per day  (would have been 10mg69md were only recently given lasix and have been taking 3 tablets of it daily.  This doesn't make sense given timeline of prescriptions so there is a high risk of medication confusion.  Past Medical History:  Diagnosis Date  . Angina    never needed to take nitroglycerin.  . Anxiety   . Blood transfusion without reported diagnosis   . Cerebral atherosclerosis   . COPD (chronic obstructive pulmonary disease) ([facility]) [place]9-5-14-"pt. denies this"-shows on CXR of 2- 2013.  . CorMarland Kitchennary  artery disease 2003   s/p CABG 2014  . Diastolic CHF, on admit treated. 08/10/2011  . Fibromuscular dysplasia of renal artery ([facility])    right  . Heart murmur   . History of kidney stones 02-16-13   "hx. of overgrowth of muscle-causing some stricture"renal artery stenosis-[facility]   . Hypertension   . Hyperthyroidism    Balan  . Melanoma ([facility]) [place]0   skin cancers, 1 melanoma removed back  . Osteoarthritis     Past Surgical History:  Procedure Laterality Date  . ABDOMINAL HYSTERECTOMY  1970   heavy bleeding  . APPENDECTOMY  1960s  . CARDIAC CATHETERIZATION  08/09/2011   no sign. coronary obstruction. LIMA is atretic w/excellent flow down the native LAD  . CARDIAC CATHETERIZATION  08/22/2001   no sign. coronary stenosis,  . CATARACT EXTRACTION, BILATERAL Bilateral   . CESAREAN SECTION    . CORONARY ARTERY BYPASS GRAFT  02-16-13   3'03-no bypasses -Had 2 aneurysm repairs(stents used)  . LAPAROTOMY N/A 02/20/2013   Procedure: EXPLORATORY LAPAROTOMY;  WendyJanie Morning . LEFT HEART CATHETERIZATION WITH CORONARY ANGIOGRAM N/A 08/09/2011   Procedure: LEFT HEART CATHETERIZATION WITH CORONARY ANGIOGRAM;  Surgeon: Sanda Klein, MD;  Location: [facility] CATH LAB;  Service: Cardiovascular;  Laterality: N/A;  . NM MYOCAR PERF WALL MOTION  07/20/2010   normal  . SALPINGOOPHORECTOMY Bilateral 02/20/2013   benign seromucinous cystadenofibroma R ovary [name], MD)  . TOOTH EXTRACTION    . TOTAL HIP ARTHROPLASTY Left 2015    Family History  Problem Relation Age of Onset  . Stroke Mother   . Hypotension Mother   . Dementia Mother 81  . Coronary artery disease Father   . Hypertension Father   . Breast cancer Maternal Aunt   . Hypotension Sister   . Hypertension Brother   . Diabetes Brother   . Lung cancer Daughter   . Thyroid cancer Daughter    Social History:  reports that she quit smoking about 7 months ago. Her smoking use included cigarettes. She has a 2.00  pack-year smoking history. She has never used smokeless tobacco. She reports that she does not drink alcohol or use drugs.  Allergies:  Allergies  Allergen Reactions  . Codeine Anaphylaxis and Shortness Of Breath    Shortness of breath  . Penicillin G Shortness Of Breath and Rash  . Penicillins Anaphylaxis and Shortness Of Breath    Has patient had a PCN reaction causing immediate rash, facial/tongue/throat swelling, SOB or lightheadedness with hypotension: YES Has patient had a PCN reaction causing severe rash involving mucus membranes or skin necrosis: NO Has patient had a PCN reaction that required hospitalization: NO Has patient had a PCN reaction occurring within the last 10 years: NO If all of the above answers are "NO", then may proceed with Cephalosporin use.  . Prednisolone Acetate Nausea Only and Shortness Of Breath  . Meloxicam Hives  . Nitroglycerin   . Crestor [Rosuvastatin Calcium] Other (See Comments)    Weakness with statins  . Lopressor [Metoprolol Tartrate] Other (See Comments)    Bradycardia   . Prednisone Hives and Swelling    Shortness of breath  . Sulfamethoxazole Hives and Swelling  . Zetia [Ezetimibe] Other (See Comments)    Weakness      (Not in a hospital admission)  Results for orders placed or performed during the hospital encounter of 04/19/18 (from the past 48 hour(s))  Comprehensive metabolic panel     Status: Abnormal   Collection Time: 04/19/18 10:02 AM  Result Value Ref Range   Sodium 115 (LL) 135 - 145 mmol/L    Comment: CRITICAL RESULT CALLED TO, READ BACK BY AND VERIFIED [place]: [place] [id number] 04/19/18 BY ZBEECH.    Potassium 2.4 (LL) 3.5 - 5.1 mmol/L    Comment: CRITICAL RESULT CALLED TO, READ BACK BY AND VERIFIED WITH: CHARLOTTE SOLOMON,RN AT 1206 04/19/18 BY ZBEECH.    Chloride 73 (L) 98 - 111 mmol/L   CO2 23 22 - 32 mmol/L   Glucose, Bld 370 (H) 70 - 99 mg/dL   BUN 47 (H) 8 - 23 mg/dL   Creatinine, Ser 1.21 (H)  0.44 - 1.00 mg/dL   Calcium 8.9 8.9 - 10.3 mg/dL   Total Protein 6.8 6.5 - 8.1 g/dL   Albumin 3.7 3.5 - 5.0 g/dL   AST 31 15 - 41 U/L   ALT 22 0 - 44 U/L   Alkaline Phosphatase 97 38 - 126 U/L   Total Bilirubin 0.6 0.3 - 1.2 mg/dL   GFR calc non Af Amer 41 (L) >60 mL/min   GFR calc Af Amer 48 (L) >60 mL/min    Comment: (NOTE)  The eGFR has been calculated using the CKD EPI equation. This calculation has not been validated in all clinical situations. eGFR's persistently <60 mL/min signify possible Chronic Kidney Disease.    Anion gap 19 (H) 5 - 15    Comment: Performed at [facility], [place] [address] [place], [place] [address]  CBC with Differential     Status: Abnormal   Collection Time: 04/19/18 10:02 AM  Result Value Ref Range   WBC 10.6 (H) 4.0 - 10.5 K/uL   RBC 4.40 3.87 - 5.11 MIL/uL   Hemoglobin 12.4 12.0 - 15.0 g/dL   HCT 35.3 (L) 36.0 - 46.0 %   MCV 80.2 80.0 - 100.0 fL   MCH 28.2 26.0 - 34.0 pg   MCHC 35.1 30.0 - 36.0 g/dL   RDW 12.0 11.5 - 15.5 %   Platelets 319 150 - 400 K/uL   nRBC 0.0 0.0 - 0.2 %   Neutrophils Relative % 77 %   Neutro Abs 8.3 (H) 1.7 - 7.7 K/uL   Lymphocytes Relative 13 %   Lymphs Abs 1.3 0.7 - 4.0 K/uL   Monocytes Relative 8 %   Monocytes Absolute 0.8 0.1 - 1.0 K/uL   Eosinophils Relative 1 %   Eosinophils Absolute 0.1 0.0 - 0.5 K/uL   Basophils Relative 0 %   Basophils Absolute 0.0 0.0 - 0.1 K/uL   Immature Granulocytes 1 %   Abs Immature Granulocytes 0.07 0.00 - 0.07 K/uL    Comment: Performed at [facility] [address], [place], [place] [address]   No results found.  Review of Systems  Constitutional: Positive for weight loss. Negative for chills, diaphoresis, fever and malaise/fatigue.       Weight loss likely due to increaased diuresis over last 2 weeks  HENT: Negative for congestion, hearing loss and sore throat.   Eyes: Negative for blurred vision.  Respiratory: Positive for shortness of breath. Negative for  cough and wheezing.   Cardiovascular: Positive for orthopnea and leg swelling. Negative for chest pain and palpitations.       Chroinic leg edema  Gastrointestinal: Negative for abdominal pain.  Genitourinary: Negative for dysuria, frequency and urgency.  Musculoskeletal: Positive for back pain.  Skin: Negative for itching and rash.  Neurological: Negative for speech change.    Blood pressure 108/62, pulse 67, temperature 98.1 F (36.7 C), temperature source Oral, resp. rate 15, weight 63.5 kg, SpO2 99 %. Physical Exam  Constitutional: She appears well-developed and well-nourished. No distress.  HENT:  Head: Normocephalic.  Eyes: Conjunctivae and EOM are normal. Right eye exhibits no discharge. Left eye exhibits no discharge.  Cardiovascular: Normal rate, regular rhythm and intact distal pulses.  Murmur heard. Respiratory: Effort normal and breath sounds normal. No stridor. No respiratory distress. She has no wheezes. She exhibits no tenderness.  GI: Soft. There is no tenderness.  Musculoskeletal: She exhibits edema and deformity. She exhibits no tenderness.  Arthritic changes in hand  Neurological: She is alert. Coordination normal.  Aox2, just doesn't know the year but does know her age.  "what do I need to know the year for, I'm an old woman"  Skin: Skin is warm and dry. She is not diaphoretic.  Psychiatric: She has a normal mood and affect. Her behavior is normal. Thought content normal.     Assessment/Plan AKI on CKD 1:  Baseline GFR ~100 w/ sCr 0.6-0.7.  Does have hx of CHF and renal artery stenosis on left.  Now presenting with GFR 41 and sCr 1.21 s/p ~41monthof increased diuretic above baseline and potential incorrect administration of prescribed medicine.  It appears by charting she may have been taking/given incorrect dosing as family seems confused.  There is a chance she was intravascularly depleted given high doses of diruetic she may have been taking.  Likely acute vs  chronic change given recent low Cr/and good GFR -monitor BMP -will consider renal artery duplex given hx of renal artery stenosis, last on file was 2015.    Hyponatremia: 115 on admission.  Hyponatrema labs ordered stat and pending.   Patient's daughter described her as chronically mildly demented but as baseline able to hold a normal conversation.  Says she has been "100% not herself" for ~1wk.  She was Surprised that patient was AOx2 to my exam.  Given potential incorrect administration of an increased diuretic prescription, likely due to diuresis.  Per patient's family, patient is currently altered and would qualify as "severe" hyponatremia.  She would still be total body hypervolemic but may be intravascularly euvolemic/hypovolemic although BP seems to be at baseline.  Patient is satting well on room air with no IWB -f/u on pending labs -will consider hypernatremic IVF, if so will check BMP Q2hrs instead of current Q8 -in the mean time, would restrict free water and cease all diuretics  Hypokalemia: 2.4 on admission, ECG without QT prolongation and stable since last ECG.  444m ordered IV and 40PO by ED.  Also likely due to increased vs incorrectly administered diuretics -hold diuretics for now -will recheck BMP Q8 until normal and continue repleting   ScSherene SiresO, PGY2 04/19/2018

## 2018-04-19 NOTE — Progress Notes (Signed)
Patient arrived the unit from ER, vital signs obtained see flow sheet,CHG bath completed , patient oriented to room and staff, bed in lowest position, call bell within reach will monitor.

## 2018-04-19 NOTE — Telephone Encounter (Signed)
I called the pt's daughter about labs- I suggested she take her mother to the ED for a STAT BMP and further evaluation.  Kerin Ransom PA-C 04/19/2018 8:11 AM

## 2018-04-19 NOTE — Telephone Encounter (Signed)
-----   Message from Erlene Quan, [place] sent at 04/19/2018  8:05 AM EST ----- Please let the ED know patient is coming for STAT BMP- her Na+ is 118.  Kerin Ransom PA-C 04/19/2018 8:06 AM

## 2018-04-19 NOTE — ED Provider Notes (Signed)
[facility] EMERGENCY DEPARTMENT Provider Note   CSN: [id number] Arrival date & time: 04/19/18  0946     History   Chief Complaint Chief Complaint  Patient presents with  . Abnormal Lab  . Back Pain    HPI Meredith Gutierrez is a 77 y.o. female.  HPI   Has dementia, has been worse, more confused over the last week, not sleeping much at night, has not complained of pain other than chronic back pain Has chronic back pain, on morphine Lasix over the last 8 weeks, increased recently to try to get fluid off Short of breath, hx of COPD, when climbing stairs, has been chronic. Walks a lot  Past Medical History:  Diagnosis Date  . Angina    never needed to take nitroglycerin.  . Anxiety   . Blood transfusion without reported diagnosis   . Cerebral atherosclerosis   . COPD (chronic obstructive pulmonary disease) ([place])    02-16-13-"pt. denies this"-shows on CXR of 2- 2013.  Marland Kitchen Coronary artery disease 2003   s/p CABG 2014  . Diastolic CHF, on admit treated. 08/10/2011  . Fibromuscular dysplasia of renal artery (HCC)    right  . Heart murmur   . History of kidney stones 02-16-13   "hx. of overgrowth of muscle-causing some stricture"renal artery stenosis-being followed [facility]  . Hyperlipidemia   . Hypertension   . Hyperthyroidism    Balan  . Melanoma ([place]) 2010   skin cancers, 1 melanoma removed back  . Osteoarthritis     Patient Active Problem List   Diagnosis Date Noted  . Medicare annual wellness visit, subsequent 04/07/2018  . Chronic nasal congestion 04/03/2018  . Multiple atypical nevi 02/08/2018  . Genital warts 02/08/2018  . Agitation 02/08/2018  . Hyperkalemia 12/13/2017  . Pulmonary nodules 12/13/2017  . Shortness of breath 12/12/2017  . Esophageal dysphagia 09/07/2017  . Health maintenance examination 04/13/2017  . Decreased hearing, bilateral 04/13/2017  . Advanced care planning/counseling discussion 04/09/2017  . Vitamin D deficiency  02/14/2017  . Fibromuscular dysplasia of renal artery ([place])   . Anxiety 02/09/2017  . Chronic lower back pain 02/09/2017  . Dementia ([place]) 02/09/2017  . History of melanoma 02/09/2017  . Hyperthyroidism   . History of left renal artery stenosis 02/14/2016  . Asthma 06/14/2012  . Acute on chronic diastolic heart failure ([place]) 08/10/2011  . Dyslipidemia, statin intol. 08/07/2011  . Sinus bradycardia, beta blocker intol. 08/07/2011  . Chest pain, negative MI, cath with atretic LIMA, and excellent flow down the native LAD,patent grafts otherwise 08/05/2011  . Essential hypertension 08/05/2011  . Tobacco abuse 08/05/2011  . Hx of CABG 08/05/2011    Past Surgical History:  Procedure Laterality Date  . ABDOMINAL HYSTERECTOMY  1970   heavy bleeding  . APPENDECTOMY  1960s  . CARDIAC CATHETERIZATION  08/09/2011   no sign. coronary obstruction. LIMA is atretic w/excellent flow down the native LAD  . CARDIAC CATHETERIZATION  08/22/2001   no sign. coronary stenosis,  . CATARACT EXTRACTION, BILATERAL Bilateral   . CESAREAN SECTION    . CORONARY ARTERY BYPASS GRAFT  02-16-13   3'03-no bypasses -Had 2 aneurysm repairs(stents used)  . LAPAROTOMY N/A 02/20/2013   Procedure: EXPLORATORY LAPAROTOMY;  [name], MD  . LEFT HEART CATHETERIZATION WITH CORONARY ANGIOGRAM N/A 08/09/2011   Procedure: LEFT HEART CATHETERIZATION WITH CORONARY ANGIOGRAM;  Surgeon: Sanda Klein, MD;  Location: [facility] CATH LAB;  Service: Cardiovascular;  Laterality: N/A;  . NM [place]  07/20/2010   normal  . SALPINGOOPHORECTOMY Bilateral 02/20/2013   benign seromucinous cystadenofibroma R ovary [name], MD)  . TOOTH EXTRACTION    . TOTAL HIP ARTHROPLASTY Left 2015     OB History   None      Home Medications    Prior to Admission medications   Medication Sig Start Date End Date Taking? Authorizing Provider  albuterol (PROVENTIL HFA;VENTOLIN HFA) 108 (90 Base) MCG/ACT inhaler Inhale 1-2 puffs into  the lungs every 6 (six) hours as needed for wheezing or shortness of breath. 12/26/17  Yes Ria Bush, MD  albuterol (PROVENTIL) (2.5 MG/3ML) 0.083% nebulizer solution Take 3 mLs (2.5 mg total) by nebulization every 6 (six) hours as needed for wheezing or shortness of breath. 12/13/17  Yes Ria Bush, MD  aspirin EC 81 MG tablet Take 81 mg by mouth daily.   Yes [provider]  candesartan (ATACAND) 16 MG tablet Take 0.5 tablets (8 mg total) by mouth daily. 04/11/18  Yes Croitoru, Mihai, MD  carboxymethylcellulose (REFRESH PLUS) 0.5 % SOLN Place 1 drop into both eyes 2 (two) times daily.   Yes [provider]  cetirizine (ZYRTEC) 10 MG tablet Take 10 mg by mouth daily.   Yes [provider]  diltiazem (CARDIZEM CD) 240 MG 24 hr capsule TAKE 1 CAPSULE BY MOUTH DAILY Patient taking differently: Take 240 mg by mouth daily.  11/29/17  Yes Croitoru, Mihai, MD  furosemide (LASIX) 20 MG tablet Take 4 tablets (80mg ) daily until weight gets to 140lb, then go to 2 tablets (40mg ) daily 03/29/18  Yes Croitoru, Mihai, MD  hydrOXYzine (ATARAX/VISTARIL) 10 MG tablet Take 2 tablets (20 mg total) by mouth at bedtime as needed for anxiety. Patient taking differently: Take 20 mg by mouth at bedtime.  02/08/18  Yes Ria Bush, MD  Melatonin 10 MG TABS Take 20 mg by mouth at bedtime.   Yes [provider]  metolazone (ZAROXOLYN) 2.5 MG tablet Take 1 tablet (2.5 mg total) by mouth daily. 04/13/18 07/12/18 Yes Kilroy, Luke K, PA-C  morphine (MS CONTIN) 15 MG 12 hr tablet Take 1 tablet by mouth 2 (two) times daily. 06/15/17  Yes [provider]  morphine (MSIR) 15 MG tablet Take 15 mg by mouth 4 (four) times daily.  04/13/17  Yes Ria Bush, MD  Tiotropium Bromide Monohydrate (SPIRIVA RESPIMAT) 1.25 MCG/ACT AERS Inhale 2 Act into the lungs at bedtime. 12/27/17  Yes Ria Bush, MD  Cholecalciferol (VITAMIN D) 2000 units CAPS Take 1 capsule (2,000 Units  total) by mouth daily. Patient not taking: Reported on 04/19/2018 04/13/17   Ria Bush, MD  donepezil (ARICEPT) 5 MG tablet TAKE ONE TABLET BY MOUTH AT BEDTIME Patient not taking: Reported on 04/19/2018 08/15/17   Ria Bush, MD  nicotine (NICODERM CQ - DOSED IN MG/24 HOURS) 14 mg/24hr patch Place 1 patch (14 mg total) onto the skin daily. Patient not taking: Reported on 04/19/2018 12/12/17   Ledora Bottcher, PA    Family History Family History  Problem Relation Age of Onset  . Stroke Mother   . Hypotension Mother   . Dementia Mother 13  . Coronary artery disease Father   . Hypertension Father   . Breast cancer Maternal Aunt   . Hypotension Sister   . Hypertension Brother   . Diabetes Brother   . Lung cancer Daughter   . Thyroid cancer Daughter     Social History Social History   Tobacco Use  . Smoking status: Former  Smoker    Packs/day: 0.10    Years: 20.00    Pack years: 2.00    Types: Cigarettes    Last attempt to quit: 09/14/2017    Years since quitting: 0.5  . Smokeless tobacco: Never Used  . Tobacco comment: smoking cessation info given  Substance Use Topics  . Alcohol use: No  . Drug use: No     Allergies   Codeine; Penicillin g; Penicillins; Prednisolone acetate; Meloxicam; Nitroglycerin; Crestor [rosuvastatin calcium]; Lopressor [metoprolol tartrate]; Prednisone; Sulfamethoxazole; and Zetia [ezetimibe]   Review of Systems Review of Systems  Unable to perform ROS: Dementia  Constitutional: Negative for fever.  HENT: Negative for sore throat.   Respiratory: Positive for shortness of breath.   Cardiovascular: Negative for chest pain.  Gastrointestinal: Negative for abdominal pain, diarrhea, nausea and vomiting.  Genitourinary: Negative for difficulty urinating.  Musculoskeletal: Positive for back pain.  Skin: Negative for rash.  Neurological: Negative for seizures, syncope and headaches.  Psychiatric/Behavioral: Positive for confusion.      Physical Exam Updated Vital Signs BP (!) 109/56   Pulse 74   Temp 98.1 F (36.7 C) (Oral)   Resp 16   Wt 63.5 kg   SpO2 99%   BMI 25.61 kg/m   Physical Exam  Constitutional: She is oriented to person, place, and time. She appears well-developed and well-nourished. No distress.  HENT:  Head: Normocephalic and atraumatic.  Eyes: Conjunctivae and EOM are normal.  Neck: Normal range of motion.  Cardiovascular: Normal rate, regular rhythm, normal heart sounds and intact distal pulses. Exam reveals no gallop and no friction rub.  No murmur heard. Pulmonary/Chest: Effort normal and breath sounds normal. No respiratory distress. She has no wheezes. She has no rales.  Abdominal: Soft. She exhibits no distension. There is no tenderness. There is no guarding.  Musculoskeletal: She exhibits edema (2+ bilateral). She exhibits no tenderness.  Neurological: She is alert and oriented to person, place, and time.  Skin: Skin is warm and dry. No rash noted. She is not diaphoretic. No erythema.  Nursing note and vitals reviewed.    ED Treatments / Results  Labs (all labs ordered are listed, but only abnormal results are displayed) Labs Reviewed  COMPREHENSIVE METABOLIC PANEL - Abnormal; Notable for the following components:      Result Value   Sodium 115 (*)    Potassium 2.4 (*)    Chloride 73 (*)    Glucose, Bld 370 (*)    BUN 47 (*)    Creatinine, Ser 1.21 (*)    GFR calc non Af Amer 41 (*)    GFR calc Af Amer 48 (*)    Anion gap 19 (*)    All other components within normal limits  CBC WITH DIFFERENTIAL/PLATELET - Abnormal; Notable for the following components:   WBC 10.6 (*)    HCT 35.3 (*)    Neutro Abs 8.3 (*)    All other components within normal limits  MAGNESIUM    EKG EKG Interpretation  Date/Time:  Wednesday April 19 2018 09:56:01 EST Ventricular Rate:  81 PR Interval:    QRS Duration: 98 QT Interval:  415 QTC Calculation: 482 R Axis:   31 Text  Interpretation:  Sinus rhythm Prolonged PR interval Probable left atrial enlargement Minimal ST depression, anterolateral leads Minimal ST elevation, inferior leads No significant change since last tracing Confirmed by Gareth Morgan [phone]) on 04/19/2018 11:15:46 AM   Radiology No results found.  Procedures Procedures (including critical care time)  Medications Ordered in ED Medications  potassium chloride SA (K-DUR,KLOR-CON) CR tablet 40 mEq (has no administration in time range)  potassium chloride 10 MEQ/100ML IVPB (10 mEq  New Bag/Given 04/19/18 1215)     Initial Impression / Assessment and Plan / ED Course  I have reviewed the triage vital signs and the nursing notes.  Pertinent labs & imaging results that were available during my care of the patient were reviewed by me and considered in my medical decision making (see chart for details).    78 year old female with a history of chronic diastolic heart failure, dementia, dyslipidemia, coronary artery disease, who has recently been undergoing increase in Lasix, and addition of metolazone to help with concerns of fluid overload, presents with concern for hyponatremia found on outpatient labs checked yesterday.  Patient has a history of dementia, history is limited, however daughter does report she is had some increased confusion over the last week.  Labs show significant hyponatremia with a sodium of 115, from recent previous of 130s.  Potassium is 2.4.  No significant QTC prolongation noted.  Ordered oral and IV potassium, magnesium level.  It is difficult to determine patient volume status on exam given peripheral edema, report of weight still increased.  Hospitalist is consulted for admission, nephrologist was consulted for hyponatremia.   Final Clinical Impressions(s) / ED Diagnoses   Final diagnoses:  Hyponatremia  Hypokalemia    ED Discharge Orders    None       Gareth Morgan, MD 04/19/18 1324

## 2018-04-19 NOTE — Telephone Encounter (Signed)
Luke contacted patient. Called the ED at 8:07am this morning and informed that we have a patient on there way over there due to a critical sodium level and that patient needs to be evaluated and possibly admitted. The woman on the phone voiced understanding.

## 2018-04-19 NOTE — H&P (Addendum)
[facility]                                                                                    Patient Demographics  Meredith Gutierrez, is a 77 y.o. female  CSN: [id number]  MRN: [id number]  DOB - 10-24-1940  Admit Date - 04/19/2018  Outpatient Primary MD for the patient is Ria Bush, MD   With History of -  Past Medical History:  Diagnosis Date  . Angina    never needed to take nitroglycerin.  . Anxiety   . Blood transfusion without reported diagnosis   . Cerebral atherosclerosis   . COPD (chronic obstructive pulmonary disease) ([place])    02-16-13-"pt. denies this"-shows on CXR of 2- 2013.  Marland Kitchen Coronary artery disease 2003   s/p CABG 2014  . Diastolic CHF, on admit treated. 08/10/2011  . Fibromuscular dysplasia of renal artery (HCC)    right  . Heart murmur   . History of kidney stones 02-16-13   "hx. of overgrowth of muscle-causing some stricture"renal artery stenosis-being followed [facility]  . Hyperlipidemia   . Hypertension   . Hyperthyroidism    Balan  . Melanoma ([place]) 2010   skin cancers, 1 melanoma removed back  . Osteoarthritis       Past Surgical History:  Procedure Laterality Date  . ABDOMINAL HYSTERECTOMY  1970   heavy bleeding  . APPENDECTOMY  1960s  . CARDIAC CATHETERIZATION  08/09/2011   no sign. coronary obstruction. LIMA is atretic w/excellent flow down the native LAD  . CARDIAC CATHETERIZATION  08/22/2001   no sign. coronary stenosis,  . CATARACT EXTRACTION, BILATERAL Bilateral   . CESAREAN SECTION    . CORONARY ARTERY BYPASS GRAFT  02-16-13   3'03-no bypasses -Had 2 aneurysm repairs(stents used)  . LAPAROTOMY N/A 02/20/2013   Procedure: EXPLORATORY LAPAROTOMY;  [name], MD  . LEFT HEART CATHETERIZATION WITH CORONARY ANGIOGRAM N/A 08/09/2011   Procedure: LEFT HEART CATHETERIZATION WITH CORONARY ANGIOGRAM;  Surgeon: Sanda Klein, MD;  Location: [facility] CATH LAB;  Service: Cardiovascular;  Laterality: N/A;  . NM MYOCAR PERF WALL MOTION   07/20/2010   normal  . SALPINGOOPHORECTOMY Bilateral 02/20/2013   benign seromucinous cystadenofibroma R ovary [name], MD)  . TOOTH EXTRACTION    . TOTAL HIP ARTHROPLASTY Left 2015    in for   Chief Complaint  Patient presents with  . Abnormal Lab  . Back Pain     HPI  Meredith Gutierrez  is a 77 y.o. female, with past medical history significant for dementia, CAD status post CABG, diastolic congestive heart failure on Lasix and Zaroxolyn and chronic pain on morphine presenting with increased confusion .  Her Lasix and Zaroxolyn were both increased lately and she was found to be hyponatremic in the emergency room.  Patient denies any chest pains, but reports chronic shortness of breath due to her COPD.  Daughter is the caretaker and she was at bedside and the patient seems to be pleasantly confused.  No other complaints .    Review of Systems    In addition to the HPI above,  No Fever-chills, No Headache, No changes with Vision or hearing, No  problems swallowing food or Liquids, No Chest pain,  No Abdominal pain, No Nausea or Vommitting, Bowel movements are regular, No Blood in stool or Urine, No dysuria, No new skin rashes or bruises, No new joints pains-aches, patient has chronic back pain No new weakness, tingling, numbness in any extremity, No recent weight gain or loss, No polyuria, polydypsia or polyphagia, No significant Mental Stressors.  A full 10 point Review of Systems was done, except as stated above, all other Review of Systems were negative.   Social History Social History   Tobacco Use  . Smoking status: Former Smoker    Packs/day: 0.10    Years: 20.00    Pack years: 2.00    Types: Cigarettes    Last attempt to quit: 09/14/2017    Years since quitting: 0.5  . Smokeless tobacco: Never Used  . Tobacco comment: smoking cessation info given  Substance Use Topics  . Alcohol use: No     Family History Family History  Problem Relation Age of Onset   . Stroke Mother   . Hypotension Mother   . Dementia Mother 76  . Coronary artery disease Father   . Hypertension Father   . Breast cancer Maternal Aunt   . Hypotension Sister   . Hypertension Brother   . Diabetes Brother   . Lung cancer Daughter   . Thyroid cancer Daughter      Prior to Admission medications   Medication Sig Start Date End Date Taking? Authorizing Provider  albuterol (PROVENTIL HFA;VENTOLIN HFA) 108 (90 Base) MCG/ACT inhaler Inhale 1-2 puffs into the lungs every 6 (six) hours as needed for wheezing or shortness of breath. 12/26/17  Yes Ria Bush, MD  albuterol (PROVENTIL) (2.5 MG/3ML) 0.083% nebulizer solution Take 3 mLs (2.5 mg total) by nebulization every 6 (six) hours as needed for wheezing or shortness of breath. 12/13/17  Yes Ria Bush, MD  aspirin EC 81 MG tablet Take 81 mg by mouth daily.   Yes [provider]  candesartan (ATACAND) 16 MG tablet Take 0.5 tablets (8 mg total) by mouth daily. 04/11/18  Yes Croitoru, Mihai, MD  carboxymethylcellulose (REFRESH PLUS) 0.5 % SOLN Place 1 drop into both eyes 2 (two) times daily.   Yes [provider]  cetirizine (ZYRTEC) 10 MG tablet Take 10 mg by mouth daily.   Yes [provider]  diltiazem (CARDIZEM CD) 240 MG 24 hr capsule TAKE 1 CAPSULE BY MOUTH DAILY Patient taking differently: Take 240 mg by mouth daily.  11/29/17  Yes Croitoru, Mihai, MD  furosemide (LASIX) 20 MG tablet Take 4 tablets (80mg ) daily until weight gets to 140lb, then go to 2 tablets (40mg ) daily 03/29/18  Yes Croitoru, Mihai, MD  hydrOXYzine (ATARAX/VISTARIL) 10 MG tablet Take 2 tablets (20 mg total) by mouth at bedtime as needed for anxiety. Patient taking differently: Take 20 mg by mouth at bedtime.  02/08/18  Yes Ria Bush, MD  Melatonin 10 MG TABS Take 20 mg by mouth at bedtime.   Yes [provider]  metolazone (ZAROXOLYN) 2.5 MG tablet Take 1 tablet (2.5 mg total) by mouth daily. 04/13/18  07/12/18 Yes Kilroy, Luke K, PA-C  morphine (MS CONTIN) 15 MG 12 hr tablet Take 1 tablet by mouth 2 (two) times daily. 06/15/17  Yes [provider]  morphine (MSIR) 15 MG tablet Take 15 mg by mouth 4 (four) times daily.  04/13/17  Yes Ria Bush, MD  Tiotropium Bromide Monohydrate (SPIRIVA RESPIMAT) 1.25 MCG/ACT AERS Inhale 2  Act into the lungs at bedtime. 12/27/17  Yes Ria Bush, MD  Cholecalciferol (VITAMIN D) 2000 units CAPS Take 1 capsule (2,000 Units total) by mouth daily. Patient not taking: Reported on 04/19/2018 04/13/17   Ria Bush, MD  donepezil (ARICEPT) 5 MG tablet TAKE ONE TABLET BY MOUTH AT BEDTIME Patient not taking: Reported on 04/19/2018 08/15/17   Ria Bush, MD  nicotine (NICODERM CQ - DOSED IN MG/24 HOURS) 14 mg/24hr patch Place 1 patch (14 mg total) onto the skin daily. Patient not taking: Reported on 04/19/2018 12/12/17   Ledora Bottcher, PA    Allergies  Allergen Reactions  . Codeine Anaphylaxis and Shortness Of Breath    Shortness of breath  . Penicillin G Shortness Of Breath and Rash  . Penicillins Anaphylaxis and Shortness Of Breath    Has patient had a PCN reaction causing immediate rash, facial/tongue/throat swelling, SOB or lightheadedness with hypotension: YES Has patient had a PCN reaction causing severe rash involving mucus membranes or skin necrosis: NO Has patient had a PCN reaction that required hospitalization: NO Has patient had a PCN reaction occurring within the last 10 years: NO If all of the above answers are "NO", then may proceed with Cephalosporin use.  . Prednisolone Acetate Nausea Only and Shortness Of Breath  . Meloxicam Hives  . Nitroglycerin   . Crestor [Rosuvastatin Calcium] Other (See Comments)    Weakness with statins  . Lopressor [Metoprolol Tartrate] Other (See Comments)    Bradycardia   . Prednisone Hives and Swelling    Shortness of breath  . Sulfamethoxazole Hives and Swelling  . Zetia  [Ezetimibe] Other (See Comments)    Weakness     Physical Exam  Vitals  Blood pressure 108/62, pulse 67, temperature 98.1 F (36.7 C), temperature source Oral, resp. rate 15, weight 63.5 kg, SpO2 99 %.   1. General well-developed, well-nourished  2. Normal affect and insight, pleasantly confused.  3. No F.N deficits, grossly, patient moving all extremities.  4. Ears and Eyes appear Normal, Conjunctivae clear, PERRLA. Moist Oral Mucosa.  5. Supple Neck, No JVD, No cervical lymphadenopathy appriciated, No Carotid Bruits.  6. Symmetrical Chest wall movement, mild scattered rhonchi.  7. RRR, No Gallops, Rubs or Murmurs, No Parasternal Heave.  8. Positive Bowel Sounds, Abdomen Soft, Non tender, No organomegaly appriciated,No rebound -guarding or rigidity.  9.  No Cyanosis, Normal Skin Turgor, No Skin Rash or Bruise.  10. Good muscle tone,  joints appear normal , +2 dependent edema.    Data Review  CBC Recent Labs  Lab 04/19/18 1002  WBC 10.6*  HGB 12.4  HCT 35.3*  PLT 319  MCV 80.2  MCH 28.2  MCHC 35.1  RDW 12.0  LYMPHSABS 1.3  MONOABS 0.8  EOSABS 0.1  BASOSABS 0.0   ------------------------------------------------------------------------------------------------------------------  Chemistries  Recent Labs  Lab 04/18/18 1508 04/19/18 1002  NA 118* 115*  K 3.1* 2.4*  CL 74* 73*  CO2 24 23  GLUCOSE 238* 370*  BUN 46* 47*  CREATININE 0.92 1.21*  CALCIUM 9.1 8.9  AST  --  31  ALT  --  22  ALKPHOS  --  97  BILITOT  --  0.6   ------------------------------------------------------------------------------------------------------------------ estimated creatinine clearance is 33 mL/min (A) (by C-G formula based on SCr of 1.21 mg/dL (H)). ------------------------------------------------------------------------------------------------------------------ No results for input(s): TSH, T4TOTAL, T3FREE, THYROIDAB in the last 72 hours.  Invalid input(s):  FREET3   Coagulation profile No results for input(s): INR, PROTIME in the last  168 hours. ------------------------------------------------------------------------------------------------------------------- No results for input(s): DDIMER in the last 72 hours. -------------------------------------------------------------------------------------------------------------------  Cardiac Enzymes No results for input(s): CKMB, TROPONINI, MYOGLOBIN in the last 168 hours.  Invalid input(s): CK ------------------------------------------------------------------------------------------------------------------ Invalid input(s): POCBNP   ---------------------------------------------------------------------------------------------------------------  Urinalysis    Component Value Date/Time   COLORURINE STRAW (A) 01/06/2014 1530   APPEARANCEUR CLEAR 01/06/2014 1530   LABSPEC 1.007 01/06/2014 1530   PHURINE 6.5 01/06/2014 1530   GLUCOSEU NEGATIVE 01/06/2014 1530   HGBUR NEGATIVE 01/06/2014 1530   BILIRUBINUR NEGATIVE 01/06/2014 1530   KETONESUR NEGATIVE 01/06/2014 1530   PROTEINUR NEGATIVE 01/06/2014 1530   UROBILINOGEN 0.2 01/06/2014 1530   NITRITE NEGATIVE 01/06/2014 1530   LEUKOCYTESUR NEGATIVE 01/06/2014 1530    ----------------------------------------------------------------------------------------------------------------   Imaging results:   No results found.  My personal review of EKG: Rhythm NSR, 81 bpm with nonspecific ST changes and intraventricular conduction delay  Assessment & Plan  Altered mental status; baseline of dementia worsened by hyponatremia  Hyponatremia; probably diuresis-induced with fluid overload state    Nephrology on consult    Patient started on hypernatremic saline    Monitor closely in stepdown    Hold Lasix and Zaroxolyn  Hypokalemia     Replete History of COPD on neb treatments and tiotropium  Hypertension; continue with Avapro  Insomnia  continue with melatonin  History of dementia continue with Aricept  History of chronic pain continue with MS Contin  History of coronary artery disease diastolic congestive heart failure, status post CABG in 2014 of diuretics now continue to hyponatremia    DVT Prophylaxis Lovenox  AM Labs Ordered, also please review Full Orders  Family Communication: Admission, patients condition and plan of care including tests being ordered have been discussed with the patient and daughter who indicate understanding and agree with the plan and Code Status.  Code Status full  Disposition Plan: Home  Time spent in minutes : 43 minutes  Condition GUARDED   @SIGNATURE @

## 2018-04-19 NOTE — ED Triage Notes (Signed)
PT reports back pain that started last night. Denies dysuria. PT reports pain is worse with movement.   PT had labs result yesterday that revealed several abnormalities. PT is a poor historian, her daughter will be here soon. She is the primary caretaker

## 2018-04-20 ENCOUNTER — Inpatient Hospital Stay (HOSPITAL_COMMUNITY): Payer: Medicare HMO

## 2018-04-20 DIAGNOSIS — R1314 Dysphagia, pharyngoesophageal phase: Secondary | ICD-10-CM | POA: Diagnosis present

## 2018-04-20 DIAGNOSIS — E559 Vitamin D deficiency, unspecified: Secondary | ICD-10-CM | POA: Diagnosis present

## 2018-04-20 DIAGNOSIS — G4700 Insomnia, unspecified: Secondary | ICD-10-CM | POA: Diagnosis present

## 2018-04-20 DIAGNOSIS — Z87891 Personal history of nicotine dependence: Secondary | ICD-10-CM | POA: Diagnosis not present

## 2018-04-20 DIAGNOSIS — Z951 Presence of aortocoronary bypass graft: Secondary | ICD-10-CM

## 2018-04-20 DIAGNOSIS — I878 Other specified disorders of veins: Secondary | ICD-10-CM | POA: Diagnosis present

## 2018-04-20 DIAGNOSIS — N179 Acute kidney failure, unspecified: Secondary | ICD-10-CM

## 2018-04-20 DIAGNOSIS — Z801 Family history of malignant neoplasm of trachea, bronchus and lung: Secondary | ICD-10-CM | POA: Diagnosis not present

## 2018-04-20 DIAGNOSIS — I10 Essential (primary) hypertension: Secondary | ICD-10-CM

## 2018-04-20 DIAGNOSIS — F05 Delirium due to known physiological condition: Secondary | ICD-10-CM | POA: Diagnosis present

## 2018-04-20 DIAGNOSIS — E876 Hypokalemia: Secondary | ICD-10-CM | POA: Diagnosis present

## 2018-04-20 DIAGNOSIS — F0150 Vascular dementia without behavioral disturbance: Secondary | ICD-10-CM

## 2018-04-20 DIAGNOSIS — I130 Hypertensive heart and chronic kidney disease with heart failure and stage 1 through stage 4 chronic kidney disease, or unspecified chronic kidney disease: Secondary | ICD-10-CM | POA: Diagnosis present

## 2018-04-20 DIAGNOSIS — I5032 Chronic diastolic (congestive) heart failure: Secondary | ICD-10-CM | POA: Diagnosis present

## 2018-04-20 DIAGNOSIS — F0391 Unspecified dementia with behavioral disturbance: Secondary | ICD-10-CM

## 2018-04-20 DIAGNOSIS — J449 Chronic obstructive pulmonary disease, unspecified: Secondary | ICD-10-CM | POA: Diagnosis present

## 2018-04-20 DIAGNOSIS — Z85820 Personal history of malignant melanoma of skin: Secondary | ICD-10-CM

## 2018-04-20 DIAGNOSIS — T502X5A Adverse effect of carbonic-anhydrase inhibitors, benzothiadiazides and other diuretics, initial encounter: Secondary | ICD-10-CM | POA: Diagnosis present

## 2018-04-20 DIAGNOSIS — I2510 Atherosclerotic heart disease of native coronary artery without angina pectoris: Secondary | ICD-10-CM | POA: Diagnosis present

## 2018-04-20 DIAGNOSIS — H9193 Unspecified hearing loss, bilateral: Secondary | ICD-10-CM

## 2018-04-20 DIAGNOSIS — I773 Arterial fibromuscular dysplasia: Secondary | ICD-10-CM | POA: Diagnosis present

## 2018-04-20 DIAGNOSIS — G8929 Other chronic pain: Secondary | ICD-10-CM | POA: Diagnosis present

## 2018-04-20 DIAGNOSIS — E1122 Type 2 diabetes mellitus with diabetic chronic kidney disease: Secondary | ICD-10-CM | POA: Diagnosis present

## 2018-04-20 DIAGNOSIS — N181 Chronic kidney disease, stage 1: Secondary | ICD-10-CM | POA: Diagnosis present

## 2018-04-20 DIAGNOSIS — E785 Hyperlipidemia, unspecified: Secondary | ICD-10-CM | POA: Diagnosis present

## 2018-04-20 DIAGNOSIS — I672 Cerebral atherosclerosis: Secondary | ICD-10-CM | POA: Diagnosis present

## 2018-04-20 DIAGNOSIS — I701 Atherosclerosis of renal artery: Secondary | ICD-10-CM | POA: Diagnosis present

## 2018-04-20 DIAGNOSIS — H9190 Unspecified hearing loss, unspecified ear: Secondary | ICD-10-CM | POA: Diagnosis present

## 2018-04-20 DIAGNOSIS — R1310 Dysphagia, unspecified: Secondary | ICD-10-CM

## 2018-04-20 DIAGNOSIS — F0390 Unspecified dementia without behavioral disturbance: Secondary | ICD-10-CM | POA: Diagnosis present

## 2018-04-20 DIAGNOSIS — E871 Hypo-osmolality and hyponatremia: Secondary | ICD-10-CM | POA: Diagnosis present

## 2018-04-20 LAB — BASIC METABOLIC PANEL
Anion gap: 11 (ref 5–15)
Anion gap: 9 (ref 5–15)
Anion gap: 11 (ref 5–15)
Anion gap: 9 (ref 5–15)
Anion gap: 9 (ref 5–15)
Anion gap: 9 (ref 5–15)
BUN: 32 mg/dL — AB (ref 8–23)
BUN: 26 mg/dL — AB (ref 8–23)
BUN: 21 mg/dL (ref 8–23)
BUN: 19 mg/dL (ref 8–23)
BUN: 16 mg/dL (ref 8–23)
BUN: 17 mg/dL (ref 8–23)
CALCIUM: 8.7 mg/dL — AB (ref 8.9–10.3)
CALCIUM: 8.6 mg/dL — AB (ref 8.9–10.3)
CALCIUM: 8.8 mg/dL — AB (ref 8.9–10.3)
CALCIUM: 9 mg/dL (ref 8.9–10.3)
CALCIUM: 8.7 mg/dL — AB (ref 8.9–10.3)
CHLORIDE: 81 mmol/L — AB (ref 98–111)
CHLORIDE: 92 mmol/L — AB (ref 98–111)
CHLORIDE: 91 mmol/L — AB (ref 98–111)
CHLORIDE: 87 mmol/L — AB (ref 98–111)
CO2: 26 mmol/L (ref 22–32)
CO2: 26 mmol/L (ref 22–32)
CO2: 24 mmol/L (ref 22–32)
CO2: 27 mmol/L (ref 22–32)
CO2: 27 mmol/L (ref 22–32)
CO2: 25 mmol/L (ref 22–32)
CREATININE: 0.74 mg/dL (ref 0.44–1.00)
CREATININE: 0.69 mg/dL (ref 0.44–1.00)
CREATININE: 0.65 mg/dL (ref 0.44–1.00)
CREATININE: 0.68 mg/dL (ref 0.44–1.00)
CREATININE: 0.78 mg/dL (ref 0.44–1.00)
CREATININE: 0.62 mg/dL (ref 0.44–1.00)
Calcium: 8.3 mg/dL — ABNORMAL LOW (ref 8.9–10.3)
Chloride: 90 mmol/L — ABNORMAL LOW (ref 98–111)
Chloride: 93 mmol/L — ABNORMAL LOW (ref 98–111)
GFR calc Af Amer: >60 mL/min (ref >60)
GFR calc Af Amer: >60 mL/min (ref >60)
GFR calc Af Amer: >60 mL/min (ref >60)
GFR calc non Af Amer: >60 mL/min (ref >60)
GFR calc non Af Amer: >60 mL/min (ref >60)
GFR calc non Af Amer: >60 mL/min (ref >60)
GFR calc non Af Amer: >60 mL/min (ref >60)
GLUCOSE: 140 mg/dL — AB (ref 70–99)
Glucose, Bld: 198 mg/dL — ABNORMAL HIGH (ref 70–99)
Glucose, Bld: 152 mg/dL — ABNORMAL HIGH (ref 70–99)
Glucose, Bld: 163 mg/dL — ABNORMAL HIGH (ref 70–99)
Glucose, Bld: 195 mg/dL — ABNORMAL HIGH (ref 70–99)
Glucose, Bld: 178 mg/dL — ABNORMAL HIGH (ref 70–99)
POTASSIUM: 3.4 mmol/L — AB (ref 3.5–5.1)
Potassium: 3.3 mmol/L — ABNORMAL LOW (ref 3.5–5.1)
Potassium: 3.1 mmol/L — ABNORMAL LOW (ref 3.5–5.1)
Potassium: 3.2 mmol/L — ABNORMAL LOW (ref 3.5–5.1)
Potassium: 3 mmol/L — ABNORMAL LOW (ref 3.5–5.1)
Potassium: 3.4 mmol/L — ABNORMAL LOW (ref 3.5–5.1)
SODIUM: 118 mmol/L — AB (ref 135–145)
SODIUM: 125 mmol/L — AB (ref 135–145)
SODIUM: 127 mmol/L — AB (ref 135–145)
Sodium: 128 mmol/L — ABNORMAL LOW (ref 135–145)
Sodium: 128 mmol/L — ABNORMAL LOW (ref 135–145)
Sodium: 121 mmol/L — ABNORMAL LOW (ref 135–145)

## 2018-04-20 LAB — COMPREHENSIVE METABOLIC PANEL
ALT: 22 U/L (ref 0–44)
ANION GAP: 19 — AB (ref 5–15)
AST: 31 U/L (ref 15–41)
Albumin: 3.7 g/dL (ref 3.5–5.0)
Alkaline Phosphatase: 97 U/L (ref 38–126)
BUN: 47 mg/dL — AB (ref 8–23)
CHLORIDE: 73 mmol/L — AB (ref 98–111)
CO2: 23 mmol/L (ref 22–32)
Calcium: 8.9 mg/dL (ref 8.9–10.3)
Creatinine, Ser: 1.21 mg/dL — ABNORMAL HIGH (ref 0.44–1.00)
GFR, EST AFRICAN AMERICAN: 48 mL/min — AB (ref >60)
GFR, EST NON AFRICAN AMERICAN: 41 mL/min — AB (ref >60)
Glucose, Bld: 370 mg/dL — ABNORMAL HIGH (ref 70–99)
POTASSIUM: 2.4 mmol/L — AB (ref 3.5–5.1)
Sodium: 115 mmol/L — CL (ref 135–145)
Total Bilirubin: 0.6 mg/dL (ref 0.3–1.2)
Total Protein: 6.8 g/dL (ref 6.5–8.1)

## 2018-04-20 LAB — OSMOLALITY, URINE: OSMOLALITY UR: 267 mosm/kg — AB (ref 300–900)

## 2018-04-20 LAB — NA AND K (SODIUM & POTASSIUM), RAND UR
Potassium Urine: 22 mmol/L
SODIUM UR: 26 mmol/L

## 2018-04-20 LAB — HEMOGLOBIN A1C
Hgb A1c MFr Bld: 8.4 % — ABNORMAL HIGH (ref 4.8–5.6)
Mean Plasma Glucose: 194.38 mg/dL

## 2018-04-20 MED ORDER — MELATONIN 10 MG PO TABS: 20.0000 mg | ORAL_TABLET | Freq: Every day | ORAL | Status: DC

## 2018-04-20 MED ORDER — LORAZEPAM 2 MG/ML IJ SOLN
0.5000 mg | Freq: Every evening | INTRAMUSCULAR | Status: DC | PRN
  Administered 2018-04-21: 0.5 mg via INTRAVENOUS
  Filled 2018-04-20: qty 1

## 2018-04-20 MED ORDER — POTASSIUM CHLORIDE 10 MEQ/100ML IV SOLN
INTRAVENOUS | Status: AC
  Administered 2018-04-20: 10 meq
  Filled 2018-04-20: qty 100

## 2018-04-20 MED ORDER — MELATONIN 3 MG PO TABS: 9.0000 mg | ORAL_TABLET | Freq: Every day | ORAL | Status: DC

## 2018-04-20 MED ORDER — POTASSIUM CHLORIDE 10 MEQ/100ML IV SOLN
10.0000 meq | INTRAVENOUS | Status: AC
  Administered 2018-04-20 (×3): 10 meq via INTRAVENOUS
  Filled 2018-04-20 (×3): qty 100

## 2018-04-20 MED ORDER — QUETIAPINE FUMARATE 25 MG PO TABS
12.5000 mg | ORAL_TABLET | Freq: Every day | ORAL | Status: DC
  Administered 2018-04-20: 12.5 mg via ORAL
  Filled 2018-04-20: qty 1

## 2018-04-20 MED ORDER — TIOTROPIUM BROMIDE MONOHYDRATE 1.25 MCG/ACT IN AERS: 2.0000 | INHALATION_SPRAY | Freq: Every day | RESPIRATORY_TRACT | Status: DC

## 2018-04-20 MED ORDER — LORATADINE 10 MG PO TABS: 10.0000 mg | ORAL_TABLET | Freq: Every day | ORAL | Status: DC

## 2018-04-20 MED ORDER — SODIUM CHLORIDE 0.9 % IV SOLN
INTRAVENOUS | Status: DC
  Administered 2018-04-20: 06:00:00 via INTRAVENOUS

## 2018-04-20 MED ORDER — POTASSIUM CHLORIDE CRYS ER 20 MEQ PO TBCR: 40.0000 meq | EXTENDED_RELEASE_TABLET | Freq: Once | ORAL | Status: DC

## 2018-04-20 MED ORDER — DONEPEZIL HCL 5 MG PO TABS
5.0000 mg | ORAL_TABLET | Freq: Every day | ORAL | Status: DC
  Administered 2018-04-20: 5 mg via ORAL
  Filled 2018-04-20: qty 1

## 2018-04-20 NOTE — Progress Notes (Signed)
CRITICAL VALUE ALERT  Critical Value:  NA 116  Date & Time Notied: 06 May 03 1999  Provider Notified: K Schorr  Orders Received/Actions taken:NO new orders given

## 2018-04-20 NOTE — Progress Notes (Signed)
[facility] Progress Note    Assessment/ Plan:   1. Subacute hypotonic hyponatremia- improving but slightly above goal rate, 115 @10am  11/6, now 125 @330am  11/7 with goal rate of =<8 improvement for first 24hrs.  Leading diff dx is over diuresis vs incorrect medication administration based on history/exam (see h/p).  ED labs indicate serum osm 263(low) urine osm 239(low), and urine sodium 63.  Vitals stable, clinically patient is volume up with 3+ pitting edema.  Urine output was not charted but was significant per daughter.  Pharmacy interview with daughter confirms extra metolazone given incorrectly at home -stopped hypertonic saline for now, will restart when appropriate given timeline for avoiding demyelination -will cont normal saline @50ml /hr (patient also with CHF so fluid balance is a concern although GFR recovering and she has -cont to hold diuretics -strict I/O  2. Hypokalemia: Improving. 3.1 am 11/7 but was 2.4 on admission to ED 11/6.  Now s/p 87meq of supplementation (IV and PO).  Patient with GFR>60 -1x additional PO kdur 40mg  -will continue to monitor on serial BMPs ordered for hyponatremia -continue to hold diuretics until hyponatremia resolved -patient may need K supplementation ordered outpatient as it's not in home med list at this time  3: AKI: resolved.  Back to GFR >60 and sCr 0.69.  Likely etiology was perfusion related 2/2 to overdiuresis  4. Blood pressure: stable: one soft reading of 86/45 but otherwise unconcerning -will check with nurse, if patient was sleeping and is assytmptomatic otherwise, no intervention needed   Subjective:   Patient was very sleepy s/p ativan given overnight for sundowning, arousable to voice and rubbing shoulder.  Still AOx2 when woken but goes back to sleep immediately.  Per daughter, "she always goes out hard when she gets sleep meds"   Objective:   BP 101/72   Pulse 63   Temp 98.8 F (37.1 C) (Oral)   Resp 19    Ht 5\' 2"  (1.575 m)   Wt 68.1 kg   SpO2 96%   BMI 27.47 kg/m   Intake/Output Summary (Last 24 hours) at 04/20/2018 3570 Last data filed at 04/20/2018 0300 Gross per 24 hour  Intake 570 ml  Output -  Net 570 ml   Weight change:   Physical Exam: VXB:[id number], arousable, on RA CVS:RRR, no murmurs Resp:no IWB, no wheezes, no crackles alhtough patient sleeping and unable to evaluate posterior lungs SPQ:ZRAQ Ext:3+ pitting edema in legs  Imaging: No results found.  Labs: BMET Recent Labs  Lab 04/18/18 1508 04/19/18 1002 04/19/18 1431 04/19/18 1857 04/19/18 2252 04/20/18 0336  NA 118* 115* 117* 116* 118* 125*  K 3.1* 2.4* 2.4* 3.1* 3.3* 3.1*  CL 74* 73* 72* 76* 81* 90*  CO2 24 23 29 25 26 26   GLUCOSE 238* 370* 297* 257* 198* 152*  BUN 46* 47* 47* 40* 32* 26*  CREATININE 0.92 1.21* 1.06* 0.91 0.74 0.69  CALCIUM 9.1 8.9 8.9 8.8* 8.7* 8.6*   CBC Recent Labs  Lab 04/19/18 1002  WBC 10.6*  NEUTROABS 8.3*  HGB 12.4  HCT 35.3*  MCV 80.2  PLT 319    Medications:    . aspirin EC  81 mg Oral Daily  . cholecalciferol  2,000 Units Oral Daily  . enoxaparin (LOVENOX) injection  40 mg Subcutaneous Q24H  . irbesartan  37.5 mg Oral Daily  . loratadine  10 mg Oral Daily  . Melatonin  21 mg Oral QHS  . morphine  15 mg Oral BID  . morphine  15 mg Oral QID  . nicotine  14 mg Transdermal Daily  . polyvinyl alcohol  1 drop Both Eyes BID  . umeclidinium bromide  1 puff Inhalation Daily      Sherene Sires, DO PGY2 04/20/2018, 6:05 AM

## 2018-04-20 NOTE — Progress Notes (Addendum)
PROGRESS NOTE    Meredith Gutierrez   ION:[id number]  DOB: 04-Oct-1940  DOA: 04/19/2018 PCP: Ria Bush, MD   Brief Narrative:  Meredith Gutierrez  is a 77 y.o. female, with past medical history significant for dementia, CAD status post CABG, diastolic congestive heart failure on Lasix and Zaroxolyn and chronic pain on morphine presenting with increased confusion .  Her Lasix and Zaroxolyn were both increased lately and she was found to be hyponatremic in the emergency room. Between 11/5 and 11/6>  sodium 118 > 115, K 3.1> 2.4, Glucose 38 > 370, BUN46, Cr 0.92 >> 1.21  Subjective: No complaints. Daughter at bedside.     Assessment & Plan:   Principal Problem:   Hyponatremia - suspected to be due to diuretics - cardiology visit on 10/3- Lasix was increased from 20 to 40 mg daily to be taken until her weight came down to 140 - phone call on 10/16- Lasix increased to 80 mg daily as daughter stated feet were still swollen - 10/31- Metolazone 2.5 x 3 days added - urine studies reviewed- nephro assisting with management - sodium up to 125 today- on call physician switched to NS - subsequent sodium 128- NS infusion stopped by renal- monitor overnight  Active Problems:   Hypokalemia - K improved to 3.1- will give 80 meq total today - Mg 1.9 yesterday  Pedal edema- likely venous stasis  - daughter stated TEDS make patient uncomfortable - will try ACE wraps- d/w daughter who is agreeable to learn how to do them  AKI - resolved with hydrataion  Questionable h/o RAS - - I have reviewed prior notes apparently she was diagnoses with RAS (possibly fibromuscular dysplasia of renal artery) and then noted that it was never present - duplex shows > 60 stenosis of right renal artery    Essential hypertension - on Cardizem, candesartan, Lasix, Zaroxolyn @ home which have been held in the hospital - renal has started Irbesartan today  CAD/ CABG - ASA 81  COPD - uses on only Albuterol  PRN per daughter - stopped smoking about 6 months ago  Chronic pain - MS Contin an dMSIR     Dementia - withbehaviroal disturbance - received Ativan close to midnight after receiving Melatonin and Hydroxizine- she takes the later 2 medications at home but has not been sleeping despite them - have discussed with daughter that we can try a small dose of Seroquel tonight and if it does not help than 1/2 mg of Ativan afterwards - cont Aricept    Vitamin D deficiency - cont supplements    Esophageal dysphagia - office visit 10/30- EGD planned for dilatation    Chronic nasal congestion - saline spray per ENT on 10/29    Decreased hearing, bilateral   History of melanoma  Dyslipidemia - intolerant to statins  DVT prophylaxis: Lovenox Code Status: Full code Family Communication: daughter, [name] Disposition Plan: possibly home tomorrow Consultants:   nephrology Procedures:    Antimicrobials:  Anti-infectives (From admission, onward)   None       Objective: Vitals:   04/19/18 2235 04/20/18 0332 04/20/18 0445 04/20/18 0815  BP: 110/72 (!) 86/45 101/72 116/65  Pulse: 68 63  65  Resp: (!) 26 (!) 22 19 18   Temp: 98.4 F (36.9 C) 98.8 F (37.1 C)  98.7 F (37.1 C)  TempSrc: Oral Oral  Oral  SpO2: 98% 97% 96% 96%  Weight:  68.1 kg    Height:  Intake/Output Summary (Last 24 hours) at 04/20/2018 0936 Last data filed at 04/20/2018 0605 Gross per 24 hour  Intake 721 ml  Output -  Net 721 ml   Filed Weights   04/19/18 0953 04/20/18 0332  Weight: 63.5 kg 68.1 kg    Examination: General exam: Appears comfortable  HEENT: PERRLA, oral mucosa moist, no sclera icterus or thrush Respiratory system: Clear to auscultation. Respiratory effort normal. Cardiovascular system: S1 & S2 heard, RRR.   Gastrointestinal system: Abdomen soft, non-tender, nondistended. Normal bowel sounds. Central nervous system: Alert - No focal neurological deficits. Extremities: No  cyanosis, clubbing or edema Skin: No rashes or ulcers Psychiatry:  Mood & affect appropriate.     Data Reviewed: I have personally reviewed following labs and imaging studies  CBC: Recent Labs  Lab 04/19/18 1002  WBC 10.6*  NEUTROABS 8.3*  HGB 12.4  HCT 35.3*  MCV 80.2  PLT 962   Basic Metabolic Panel: Recent Labs  Lab 04/19/18 1002 04/19/18 1308 04/19/18 1431 04/19/18 1857 04/19/18 2252 04/20/18 0336  NA 115*  --  117* 116* 118* 125*  K 2.4*  --  2.4* 3.1* 3.3* 3.1*  CL 73*  --  72* 76* 81* 90*  CO2 23  --  29 25 26 26   GLUCOSE 370*  --  297* 257* 198* 152*  BUN 47*  --  47* 40* 32* 26*  CREATININE 1.21*  --  1.06* 0.91 0.74 0.69  CALCIUM 8.9  --  8.9 8.8* 8.7* 8.6*  MG  --  1.9  --   --   --   --    GFR: Estimated Creatinine Clearance: 51.6 mL/min (by C-G formula based on SCr of 0.69 mg/dL). Liver Function Tests: Recent Labs  Lab 04/19/18 1002  AST 31  ALT 22  ALKPHOS 97  BILITOT 0.6  PROT 6.8  ALBUMIN 3.7   No results for input(s): LIPASE, AMYLASE in the last 168 hours. No results for input(s): AMMONIA in the last 168 hours. Coagulation Profile: No results for input(s): INR, PROTIME in the last 168 hours. Cardiac Enzymes: No results for input(s): CKTOTAL, CKMB, CKMBINDEX, TROPONINI in the last 168 hours. BNP (last 3 results) Recent Labs    04/18/18 1508  PROBNP 235   HbA1C: No results for input(s): HGBA1C in the last 72 hours. CBG: No results for input(s): GLUCAP in the last 168 hours. Lipid Profile: No results for input(s): CHOL, HDL, LDLCALC, TRIG, CHOLHDL, LDLDIRECT in the last 72 hours. Thyroid Function Tests: Recent Labs    04/19/18 1857  TSH 0.463   Anemia Panel: No results for input(s): VITAMINB12, FOLATE, FERRITIN, TIBC, IRON, RETICCTPCT in the last 72 hours. Urine analysis:    Component Value Date/Time   COLORURINE STRAW (A) 04/19/2018 1340   APPEARANCEUR CLEAR 04/19/2018 1340   LABSPEC 1.005 04/19/2018 1340   PHURINE 6.0  04/19/2018 1340   GLUCOSEU 150 (A) 04/19/2018 1340   HGBUR NEGATIVE 04/19/2018 1340   BILIRUBINUR NEGATIVE 04/19/2018 1340   KETONESUR NEGATIVE 04/19/2018 1340   PROTEINUR NEGATIVE 04/19/2018 1340   UROBILINOGEN 0.2 01/06/2014 1530   NITRITE NEGATIVE 04/19/2018 1340   LEUKOCYTESUR SMALL (A) 04/19/2018 1340   Sepsis Labs: @LABRCNTIP (procalcitonin:4,lacticidven:4) )No results found for this or any previous visit (from the past 240 hour(s)).       Radiology Studies: No results found.    Scheduled Meds: . aspirin EC  81 mg Oral Daily  . cholecalciferol  2,000 Units Oral Daily  . donepezil  5 mg  Oral QHS  . enoxaparin (LOVENOX) injection  40 mg Subcutaneous Q24H  . irbesartan  37.5 mg Oral Daily  . loratadine  10 mg Oral Daily  . Melatonin  20 mg Oral QHS  . morphine  15 mg Oral BID  . morphine  15 mg Oral QID  . nicotine  14 mg Transdermal Daily  . polyvinyl alcohol  1 drop Both Eyes BID  . potassium chloride  40 mEq Oral Once  . potassium chloride  40 mEq Oral Once   Continuous Infusions:   LOS: 0 days    Time spent in minutes: 35    Debbe Odea, MD [facility] Pager: [email or web address] Password [facility] 04/20/2018, 9:36 AM

## 2018-04-20 NOTE — Progress Notes (Signed)
PT Cancellation Note  Patient Details Name: Meredith Gutierrez MRN: [id number] DOB: 1940/07/08   Cancelled Treatment:    Reason Eval/Treat Not Completed: Fatigue/lethargy limiting ability to participate. Pt very groggy from meds last night. Will defer eval until more awake.   [place] 04/20/2018, 10:59 AM [place] Pager [phone] Office [phone]

## 2018-04-20 NOTE — Progress Notes (Signed)
CRITICAL VALUE ALERT  Critical Value:  NA 118  Date & Time Notied:  07 nov 19 0001  Provider Notified:K Schorr  Orders Received/Actions taken: No new orders given

## 2018-04-20 NOTE — Progress Notes (Signed)
NA came back 125 up from 118 at 2300 hrs paged K Schorr hypertonic saline d/cd and 0.9 normal saline started will continue to monitor

## 2018-04-20 NOTE — Progress Notes (Signed)
Renal artery duplex completed. Technically limited due to patient positioning and heavy breathing.   Prelim: Right >60% RAS. Left no evidence of RAS.  Landry Mellow, RDMS, RVT

## 2018-04-20 NOTE — Progress Notes (Signed)
Results for LAYA, LETENDRE (MRN [id number]) as of 04/20/2018 10:28  Ref. Range 04/19/2018 14:31 04/19/2018 18:57 04/19/2018 22:52 04/20/2018 03:36  Glucose Latest Ref Range: 70 - 99 mg/dL 297 (H) 257 (H) 198 (H) 152 (H)  Noted that blood sugars were elevated. Note that HgbA1C is 8.4%. According to American Diabetes Association, diagnosis of diabetes is a HgbA1C of 6.5% or greater. Would this be a new diagnosis? Will need to follow up with PCP for blood sugars.    Harvel Ricks RN BSN CDE Diabetes Coordinator Pager: [phone]  8am-5pm

## 2018-04-21 ENCOUNTER — Other Ambulatory Visit: Payer: Self-pay

## 2018-04-21 ENCOUNTER — Encounter (HOSPITAL_COMMUNITY): Payer: Self-pay | Admitting: General Practice

## 2018-04-21 DIAGNOSIS — E871 Hypo-osmolality and hyponatremia: Principal | ICD-10-CM

## 2018-04-21 LAB — BASIC METABOLIC PANEL
Anion gap: 8 (ref 5–15)
Anion gap: 6 (ref 5–15)
BUN: 13 mg/dL (ref 8–23)
BUN: 12 mg/dL (ref 8–23)
CALCIUM: 8.6 mg/dL — AB (ref 8.9–10.3)
CALCIUM: 8.9 mg/dL (ref 8.9–10.3)
CHLORIDE: 93 mmol/L — AB (ref 98–111)
CO2: 26 mmol/L (ref 22–32)
CO2: 29 mmol/L (ref 22–32)
Chloride: 94 mmol/L — ABNORMAL LOW (ref 98–111)
Creatinine, Ser: 0.56 mg/dL (ref 0.44–1.00)
Creatinine, Ser: 0.62 mg/dL (ref 0.44–1.00)
GFR calc Af Amer: >60 mL/min (ref >60)
GFR calc non Af Amer: >60 mL/min (ref >60)
GLUCOSE: 123 mg/dL — AB (ref 70–99)
Glucose, Bld: 148 mg/dL — ABNORMAL HIGH (ref 70–99)
Potassium: 3.1 mmol/L — ABNORMAL LOW (ref 3.5–5.1)
Potassium: 3.2 mmol/L — ABNORMAL LOW (ref 3.5–5.1)
SODIUM: 129 mmol/L — AB (ref 135–145)
Sodium: 127 mmol/L — ABNORMAL LOW (ref 135–145)

## 2018-04-21 LAB — MAGNESIUM: Magnesium: 2.2 mg/dL (ref 1.7–2.4)

## 2018-04-21 MED ORDER — POTASSIUM CHLORIDE 10 MEQ/100ML IV SOLN
10.0000 meq | INTRAVENOUS | Status: AC
  Administered 2018-04-21: 10 meq via INTRAVENOUS
  Filled 2018-04-21: qty 100

## 2018-04-21 MED ORDER — LIVING WELL WITH DIABETES BOOK
Freq: Once | Status: DC
  Filled 2018-04-21: qty 1

## 2018-04-21 MED ORDER — FUROSEMIDE 40 MG PO TABS: ORAL_TABLET | ORAL | 6 refills | Status: DC

## 2018-04-21 MED ORDER — LIVING WELL WITH DIABETES BOOK
Freq: Once | Status: AC
  Administered 2018-04-21: 08:00:00
  Filled 2018-04-21: qty 1

## 2018-04-21 MED ORDER — POTASSIUM CHLORIDE CRYS ER 20 MEQ PO TBCR: 40.0000 meq | EXTENDED_RELEASE_TABLET | Freq: Two times a day (BID) | ORAL | 0 refills | Status: DC

## 2018-04-21 MED ORDER — QUETIAPINE FUMARATE 25 MG PO TABS: 12.5000 mg | ORAL_TABLET | Freq: Every day | ORAL | 1 refills | Status: DC

## 2018-04-21 MED ORDER — POTASSIUM CHLORIDE CRYS ER 20 MEQ PO TBCR
40.0000 meq | EXTENDED_RELEASE_TABLET | Freq: Two times a day (BID) | ORAL | Status: DC
  Administered 2018-04-21: 40 meq via ORAL
  Filled 2018-04-21: qty 2

## 2018-04-21 NOTE — Telephone Encounter (Signed)
Spoke with pt's daughter, [name] (on dpr), asking how pt is doing.  Per [name], pt is better and may possibly be discharged today.  Was told pt was diabetic but after talking with the nutritionist, told sugar can be controlled with diet. But overall pt is much better.

## 2018-04-21 NOTE — Telephone Encounter (Signed)
Pt currently in hospital - plz touch base with daughter.

## 2018-04-21 NOTE — Discharge Summary (Signed)
Physician Discharge Summary  Meredith Gutierrez BJY:[id number] DOB: 11-12-1940 DOA: 04/19/2018  PCP: Ria Bush, MD  Admit date: 04/19/2018 Discharge date: 04/21/2018  Time spent: 20 minutes  Recommendations for Outpatient Follow-up:  1. Needs outpatient repeat Chem-7 and CBC 2. Needs outpatient follow-up with cardiology-will C Dr. Orene Desanctis who has seen her in the past as patient has renal artery stenosis and may need further work-up as outpatient 3. Note medication changes and discontinuation of hydroxyzine and Ativan placed on low-dose Seroquel this admission for behavioral disturbances at night-- Cardizem was DC'd as well in addition to metolazone-also placed on potassium replacement needs probably goals of care as outpatient if no better  Discharge Diagnoses:  Principal Problem:   Hyponatremia Active Problems:   Essential hypertension   Hx of CABG   Dementia (HCC)   History of melanoma   Vitamin D deficiency   Decreased hearing, bilateral   Esophageal dysphagia   Chronic nasal congestion   Hypokalemia   COPD (chronic obstructive pulmonary disease) ([place])   Discharge Condition: Proved  Diet recommendation: Regular  Filed Weights   04/19/18 0953 04/20/18 0332  Weight: 63.5 kg 68.1 kg    History of present illness:  77 year old female moderate to severe dementia, CAD status post CABG, diastolic heart failure on diuretics including Lasix and Zaroxolyn recently increased Admitted with hyponatremia confusion in the emergency room found to have sodium in the 115 range as well as increasing creatinine from 0.9-1.2 with a BUN of 46 Admitted for the same  Hospital Course:  hypoNatremia secondary to sodium wasting diuretics discontinue nephrology saw the patient felt it was secondary to diuretics sodium eventually came up after volume addition to 129 and it was felt patient could go back on low-dose diuretics 40 daily as she was more euvolemic  Hypokalemia-patient started on  potassium replacement discontinued on discharge needs labs as outpatient  Venous stasis edema-has an outpatient Ace wraps teds make patient uncomfortable  AKI-resolved  ?  RAS-diagnosed with fibromuscular dysplasia renal artery-duplex showed >60% stenosis right renal artery-outpatient follow-up needed  HTN multiple meds discontinued only Irbesartan on discharge  CAD-continue aspirin  Chronic low back pain continue MS Contin as outpatient-we will need therapy evaluation services as an outpatient  Dementia continue Aricept in addition to trial of Seroquel as outpatient for sleep  Dysphagia on office visit 10/30 being planned and worked up for EGD . Procedures:  No Specific  Consultations:  Nephrology  Discharge Exam: Vitals:   04/21/18 0358 04/21/18 0814  BP: 115/84 127/72  Pulse: 97 84  Resp: 18   Temp: 98.8 F (37.1 C) 98.3 F (36.8 C)  SpO2: 99% 100%    General: awake but confused-nad Cardiovascular: s1 s2 no m/r/g Respiratory: clear no added sound abd soft nt nd no rebound no guard Neuro intact  Discharge Instructions   Discharge Instructions    Diet - low sodium heart healthy   Complete by:  As directed    Increase activity slowly   Complete by:  As directed      Allergies as of 04/21/2018      Reactions   Codeine Anaphylaxis, Shortness Of Breath   Shortness of breath   Penicillin G Shortness Of Breath, Rash   Penicillins Anaphylaxis, Shortness Of Breath   Has patient had a PCN reaction causing immediate rash, facial/tongue/throat swelling, SOB or lightheadedness with hypotension: YES Has patient had a PCN reaction causing severe rash involving mucus membranes or skin necrosis: NO Has patient had a PCN reaction  that required hospitalization: NO Has patient had a PCN reaction occurring within the last 10 years: NO If all of the above answers are "NO", then may proceed with Cephalosporin use.   Prednisolone Acetate Nausea Only, Shortness Of Breath    Meloxicam Hives   Nitroglycerin    Crestor [rosuvastatin Calcium] Other (See Comments)   Weakness with statins   Lopressor [metoprolol Tartrate] Other (See Comments)   Bradycardia   Prednisone Hives, Swelling   Shortness of breath   Sulfamethoxazole Hives, Swelling   Zetia [ezetimibe] Other (See Comments)   Weakness      Medication List    STOP taking these medications   diltiazem 240 MG 24 hr capsule Commonly known as:  CARDIZEM CD   hydrOXYzine 10 MG tablet Commonly known as:  ATARAX/VISTARIL   Melatonin 10 MG Tabs   metolazone 2.5 MG tablet Commonly known as:  ZAROXOLYN     TAKE these medications   albuterol (2.5 MG/3ML) 0.083% nebulizer solution Commonly known as:  PROVENTIL Take 3 mLs (2.5 mg total) by nebulization every 6 (six) hours as needed for wheezing or shortness of breath. What changed:  Another medication with the same name was removed. Continue taking this medication, and follow the directions you see here.   aspirin EC 81 MG tablet Take 81 mg by mouth daily.   candesartan 16 MG tablet Commonly known as:  ATACAND Take 0.5 tablets (8 mg total) by mouth daily.   carboxymethylcellulose 0.5 % Soln Commonly known as:  REFRESH PLUS Place 1 drop into both eyes 2 (two) times daily.   cetirizine 10 MG tablet Commonly known as:  ZYRTEC Take 10 mg by mouth daily.   donepezil 5 MG tablet Commonly known as:  ARICEPT TAKE ONE TABLET BY MOUTH AT BEDTIME   furosemide 40 MG tablet Commonly known as:  LASIX Take 4 tablets (80mg ) daily until weight gets to 140lb, then go to 2 tablets (40mg ) daily What changed:  medication strength   morphine 15 MG 12 hr tablet Commonly known as:  MS CONTIN Take 1 tablet by mouth 2 (two) times daily. What changed:  Another medication with the same name was removed. Continue taking this medication, and follow the directions you see here.   potassium chloride SA 20 MEQ tablet Commonly known as:  K-DUR,KLOR-CON Take 2 tablets  (40 mEq total) by mouth 2 (two) times daily.   QUEtiapine 25 MG tablet Commonly known as:  SEROQUEL Take 0.5 tablets (12.5 mg total) by mouth at bedtime.   Vitamin D 50 MCG (2000 UT) Caps Take 1 capsule (2,000 Units total) by mouth daily.      Allergies  Allergen Reactions  . Codeine Anaphylaxis and Shortness Of Breath    Shortness of breath  . Penicillin G Shortness Of Breath and Rash  . Penicillins Anaphylaxis and Shortness Of Breath    Has patient had a PCN reaction causing immediate rash, facial/tongue/throat swelling, SOB or lightheadedness with hypotension: YES Has patient had a PCN reaction causing severe rash involving mucus membranes or skin necrosis: NO Has patient had a PCN reaction that required hospitalization: NO Has patient had a PCN reaction occurring within the last 10 years: NO If all of the above answers are "NO", then may proceed with Cephalosporin use.  . Prednisolone Acetate Nausea Only and Shortness Of Breath  . Meloxicam Hives  . Nitroglycerin   . Crestor [Rosuvastatin Calcium] Other (See Comments)    Weakness with statins  . Lopressor [Metoprolol Tartrate] Other (  See Comments)    Bradycardia   . Prednisone Hives and Swelling    Shortness of breath  . Sulfamethoxazole Hives and Swelling  . Zetia [Ezetimibe] Other (See Comments)    Weakness       The results of significant diagnostics from this hospitalization (including imaging, microbiology, ancillary and laboratory) are listed below for reference.    Significant Diagnostic Studies: Vas US Renal Artery Duplex  Result Date: 04/20/2018 ABDOMINAL VISCERAL Indications: Hx renal artery stenosis Limitations: Patient positioning and heavy breathing interference. Performing Technologist: Landry Mellow RDMS, RVT  Examination Guidelines: A complete evaluation includes B-mode imaging, spectral Doppler, color Doppler, and power Doppler as needed of all accessible portions of each vessel. Bilateral testing is  considered an integral part of a complete examination. Limited examinations for reoccurring indications may be performed as noted.  Duplex Findings: +--------------------+--------+--------+------+--------+ Mesenteric          PSV cm/sEDV cm/sPlaqueComments +--------------------+--------+--------+------+--------+ Aorta at SMA          137      23                  +--------------------+--------+--------+------+--------+ Celiac Artery Origin  131      31                  +--------------------+--------+--------+------+--------+ SMA Proximal          169      32                  +--------------------+--------+--------+------+--------+  +------------------+--------+--------+-------+ Right Renal ArteryPSV cm/sEDV cm/sComment +------------------+--------+--------+-------+ Origin              123      27           +------------------+--------+--------+-------+ Proximal            151      23           +------------------+--------+--------+-------+ Mid                 228      23           +------------------+--------+--------+-------+ Distal              254      42           +------------------+--------+--------+-------+ +-----------------+--------+--------+-------------------+ Left Renal ArteryPSV cm/sEDV cm/s      Comment       +-----------------+--------+--------+-------------------+ Origin                           unable to visualize +-----------------+--------+--------+-------------------+ Proximal            95      18                       +-----------------+--------+--------+-------------------+ Mid                 55      12                       +-----------------+--------+--------+-------------------+ Distal              59      13                       +-----------------+--------+--------+-------------------+ Technologist observations Renal Artery(s):Left kidney isoechoic lesion measuring 1.48 x 1.3 cm.   +------------+--------+--------+----+-----------+--------+--------+----+ Right KidneyPSV  cm/sEDV cm/sRI  Left KidneyPSV cm/sEDV cm/sRI   +------------+--------+--------+----+-----------+--------+--------+----+ Upper Pole  36      10      0.73Upper Pole 44      12      0.74 +------------+--------+--------+----+-----------+--------+--------+----+ Mid         29      6       0.80Mid        35      10      0.70 +------------+--------+--------+----+-----------+--------+--------+----+ Lower Pole  31      10      0.69Lower Pole 40      12      0.69 +------------+--------+--------+----+-----------+--------+--------+----+ Hilar       120     28      0.77Hilar      39      16      0.59 +------------+--------+--------+----+-----------+--------+--------+----+ +------------------+-----+------------------+-----+ Right Kidney           Left Kidney             +------------------+-----+------------------+-----+ RAR                    RAR                     +------------------+-----+------------------+-----+ RAR (manual)           RAR (manual)            +------------------+-----+------------------+-----+ Cortex                 Cortex                  +------------------+-----+------------------+-----+ Cortex thickness       Corex thickness         +------------------+-----+------------------+-----+ Kidney length (cm)10.30Kidney length (cm)10.04 +------------------+-----+------------------+-----+  Summary: Renal:  Right: Evidence of a greater than 60% stenosis of the right renal        artery. Abnormal right Resistive Index. Left:  No evidence of left renal artery stenosis. Abnormal left        Resisitve Index.  *See table(s) above for measurements and observations.  Diagnosing physician: Servando Snare MD  Electronically signed by Servando Snare MD on 04/20/2018 at 1:56:32 PM.    Final     Microbiology: No results found for this or any previous visit (from  the past 240 hour(s)).   Labs: Basic Metabolic Panel: Recent Labs  Lab 04/19/18 1308  04/20/18 1421 04/20/18 1812 04/20/18 2159 04/21/18 0200 04/21/18 0539 04/21/18 0559  NA  --    < > 128* 127* 121* 127* 129*  --   K  --    < > 3.0* 3.4* 3.4* 3.1* 3.2*  --   CL  --    < > 92* 91* 87* 93* 94*  --   CO2  --    < > 27 27 25 26 29   --   GLUCOSE  --    < > 195* 178* 140* 148* 123*  --   BUN  --    < > 19 16 17 13 12   --   CREATININE  --    < > 0.68 0.78 0.62 0.56 0.62  --   CALCIUM  --    < > 9.0 8.7* 8.3* 8.6* 8.9  --   MG 1.9  --   --   --   --   --   --  2.2   < > = values in this  interval not displayed.   Liver Function Tests: Recent Labs  Lab 04/19/18 1002  AST 31  ALT 22  ALKPHOS 97  BILITOT 0.6  PROT 6.8  ALBUMIN 3.7   No results for input(s): LIPASE, AMYLASE in the last 168 hours. No results for input(s): AMMONIA in the last 168 hours. CBC: Recent Labs  Lab 04/19/18 1002  WBC 10.6*  NEUTROABS 8.3*  HGB 12.4  HCT 35.3*  MCV 80.2  PLT 319   Cardiac Enzymes: No results for input(s): CKTOTAL, CKMB, CKMBINDEX, TROPONINI in the last 168 hours. BNP: BNP (last 3 results) Recent Labs    12/10/17 1426  BNP 76.8    ProBNP (last 3 results) Recent Labs    04/18/18 1508  PROBNP 235    CBG: No results for input(s): GLUCAP in the last 168 hours.     Signed:  Nita Sells MD   [facility] 04/21/2018, 1:34 PM

## 2018-04-21 NOTE — Evaluation (Signed)
Physical Therapy Evaluation Patient Details Name: Meredith Gutierrez MRN: [id number] DOB: 1940-10-07 Today's Date: 04/21/2018   History of Present Illness  77yo female presenting with increased confusion, found to be hyponatremic in the ED. PMH COPD, CAD s/p CABG 2014, CHF, HTN, hyperthyroidism, cardiac cath, THA   Clinical Impression   Patient received in bed, son and daughter present in room and provided functional PLOF/equipment and home information; she remains very sleepy but PT was able to rouse her for participation in PT session today. Able to perform bed mobility with min guard for supine to sit/MinA for LE management with sit to supine, and requires min guard for transfers and gait in room approximately 48fx2 with slow, shuffling pattern and mild unsteadiness however family reports this is her baseline level of function. Toileted and was able to maintain static standing for totalA pericare, then ambulated back to bed with min guard. She was left in bed with all needs met, family and nursing staff present. Session limited by fatigue and back pain. She will continue to benefit from skilled PT services in the acute setting, also recommend skilled HHPT services moving forward.     Follow Up Recommendations Home health PT    Equipment Recommendations  Other (comment)(appears to have all necessary DME and support from family/aides )    Recommendations for Other Services       Precautions / Restrictions Precautions Precautions: Fall Restrictions Weight Bearing Restrictions: No      Mobility  Bed Mobility Overal bed mobility: Needs Assistance Bed Mobility: Supine to Sit;Sit to Supine     Supine to sit: Min guard Sit to supine: Min assist   General bed mobility comments: Min guard for safety to get out of bed, MinA to get legs back in bed   Transfers Overall transfer level: Needs assistance Equipment used: None Transfers: Sit to/from Stand Sit to Stand: Min guard          General transfer comment: min guard for safety and steadying   Ambulation/Gait Ambulation/Gait assistance: Min guard Gait Distance (Feet): 10 Feet(x2) Assistive device: None Gait Pattern/deviations: Step-to pattern;Decreased step length - left;Decreased step length - right;Decreased stride length;Decreased dorsiflexion - left;Decreased dorsiflexion - right;Shuffle;Trunk flexed Gait velocity: decreased    General Gait Details: shuffling gait pattern with very short step and stride lengths, family reports this is her baseline; gait distance limited by fatigue and back pain   Stairs            Wheelchair Mobility    Modified Rankin (Stroke Patients Only)       Balance Overall balance assessment: Needs assistance Sitting-balance support: Bilateral upper extremity supported;Feet supported Sitting balance-Leahy Scale: Good     Standing balance support: No upper extremity supported;During functional activity Standing balance-Leahy Scale: Fair Standing balance comment: min guard and extended time, cues for safety                              Pertinent Vitals/Pain Pain Assessment: Faces Faces Pain Scale: Hurts whole lot Pain Location: back pain  Pain Descriptors / Indicators: Aching;Sore Pain Intervention(s): Limited activity within patient's tolerance;Monitored during session;Patient requesting pain meds-RN notified    Home Living Family/patient expects to be discharged to:: Private residence Living Arrangements: Children;Other (Comment)(stays in basement apartment at daughter's house, has aides during the day ) Available Help at Discharge: Family;Personal care attendant;Available 24 hours/day;Other (Comment)(aides during the day, family at night ) Type of Home: House  Home Access: Stairs to enter   [employer] of Steps: flight, has railing but family and aides assist with stair navigation  Home Layout: Two level Home Equipment: None      Prior  Function Level of Independence: Needs assistance   Gait / Transfers Assistance Needed: assist from aides and family  for stair navigation and walking           Hand Dominance        Extremity/Trunk Assessment   Upper Extremity Assessment Upper Extremity Assessment: Generalized weakness    Lower Extremity Assessment Lower Extremity Assessment: Generalized weakness    Cervical / Trunk Assessment Cervical / Trunk Assessment: Kyphotic  Communication   Communication: No difficulties  Cognition Arousal/Alertness: Lethargic Behavior During Therapy: Flat affect Overall Cognitive Status: History of cognitive impairments - at baseline                                 General Comments: dementia at baseline, family reports frequent sundowning       General Comments      Exercises     Assessment/Plan    PT Assessment Patient needs continued PT services  PT Problem List Decreased strength;Decreased cognition;Decreased knowledge of use of DME;Decreased activity tolerance;Decreased safety awareness;Decreased balance;Decreased mobility;Decreased coordination       PT Treatment Interventions DME instruction;Balance training;Gait training;Neuromuscular re-education;Stair training;Functional mobility training;Patient/family education;Therapeutic activities;Therapeutic exercise;Manual techniques    PT Goals (Current goals can be found in the Care Plan section)  Acute Rehab PT Goals Patient Stated Goal: go home back to routine  PT Goal Formulation: With family Time For Goal Achievement: 05/05/18 Potential to Achieve Goals: Good    Frequency Min 3X/week   Barriers to discharge        Co-evaluation               AM-PAC PT "6 Clicks" Daily Activity  Outcome Measure Difficulty turning over in bed (including adjusting bedclothes, sheets and blankets)?: A Little Difficulty moving from lying on back to sitting on the side of the bed? : A Little Difficulty  sitting down on and standing up from a chair with arms (e.g., wheelchair, bedside commode, etc,.)?: A Little Help needed moving to and from a bed to chair (including a wheelchair)?: A Little Help needed walking in hospital room?: A Lot Help needed climbing 3-5 steps with a railing? : A Lot 6 Click Score: 16    End of Session Equipment Utilized During Treatment: Gait belt Activity Tolerance: Patient limited by pain;Patient tolerated treatment well Patient left: in bed;with call bell/phone within reach;with family/visitor present;with nursing/sitter in room Nurse Communication: Mobility status PT Visit Diagnosis: Unsteadiness on feet (R26.81);Difficulty in walking, not elsewhere classified (R26.2);Muscle weakness (generalized) (M62.81)    Time: 0160-1093 PT Time Calculation (min) (ACUTE ONLY): 27 min   Charges:   PT Evaluation $PT Eval Low Complexity: 1 Low PT Treatments $Therapeutic Activity: 8-22 mins       Deniece Ree PT, DPT, CBIS  Supplemental Physical Therapist [place]    Pager [phone] Acute Rehab Office [phone]

## 2018-04-21 NOTE — Progress Notes (Signed)
[facility] Progress Note    Assessment/ Plan:   1. Subacute hypotonic hyponatremia- improving up 14 to 129 since admission 43hrs ago.  with goal rate of =<8 improvement for first 24hrs.  Leading diff dx is over diuresis vs incorrect medication administration based on history/exam (see h/p).  Vitals stable, clinically patient is volume up with 2+ pitting edema.  Urine output >4L 11/7.  Pharmacy interview with daughter confirms extra metolazone given incorrectly at home.  Mental status roughly consistent with chronic sundowning per son at bedside.  Charted at gain of 10lbs overnight from admission likely scale discrepency -s/p hypertonic saline, now treating via fluid restrition of 1221ml with heart healthy diet -cont to hold diuretics, patient still with good urine output -strict I/O -daily weights on floor scale  2. Hypokalemia: Improving. 3.2 am 11/8.  Patient with GFR>60 -3x 4meq IV -will continue to monitor on serial BMPs ordered for hyponatremia -continue to hold diuretics until hyponatremia resolved if fluid status remains reasonable -patient may need K supplementation ordered outpatient as it's not in home med list at this time  3: AKI: resolved.  Back to GFR >60 and sCr 0.69.  Likely etiology was perfusion related 2/2 to overdiuresis  4. Blood pressure: stable -will check with nurse, if patient was sleeping and is assytmptomatic otherwise, no intervention needed  5. RAS:  Stable Renal US with 60% stenosis.  BP wnl -started ibestartan  6. NEW diabetes diagnosis: A1C 8.4.  Family notified -management per primary   Subjective:   Patient still AOx2, knows she lives in [place] and knows her name.  Occasionally some confusion identifying family members (baseline per family).  Also likely impacted by new ativan/seroquel given at night for agitation (could be increased due to new setting)   Objective:   BP 115/84 (BP Location: Right Arm)   Pulse 97   Temp 98.8  F (37.1 C) (Oral)   Resp 18   Ht 5\' 2"  (1.575 m)   Wt 68.1 kg   SpO2 99%   BMI 27.47 kg/m   Intake/Output Summary (Last 24 hours) at 04/21/2018 7062 Last data filed at 04/21/2018 0402 Gross per 24 hour  Intake 1040 ml  Output 4250 ml  Net -3210 ml   Weight change:   Physical Exam: BJS:EGBTDV, arousable but sleepy, on RA CVS:rrr, no murmurs Resp:no IWB, CTAB, no crackles on exam although patient laying flat inhibits exam, on room air VOH:YWVP, no TTP Ext:2+ pitting edema in legs  Imaging: Vas US Renal Artery Duplex  Result Date: 04/20/2018 ABDOMINAL VISCERAL Indications: Hx renal artery stenosis Limitations: Patient positioning and heavy breathing interference. Performing Technologist: Landry Mellow RDMS, RVT  Examination Guidelines: A complete evaluation includes B-mode imaging, spectral Doppler, color Doppler, and power Doppler as needed of all accessible portions of each vessel. Bilateral testing is considered an integral part of a complete examination. Limited examinations for reoccurring indications may be performed as noted.  Duplex Findings: +--------------------+--------+--------+------+--------+ Mesenteric          PSV cm/sEDV cm/sPlaqueComments +--------------------+--------+--------+------+--------+ Aorta at SMA          137      23                  +--------------------+--------+--------+------+--------+ Celiac Artery Origin  131      31                  +--------------------+--------+--------+------+--------+ SMA Proximal          169  32                  +--------------------+--------+--------+------+--------+  +------------------+--------+--------+-------+ Right Renal ArteryPSV cm/sEDV cm/sComment +------------------+--------+--------+-------+ Origin              123      27           +------------------+--------+--------+-------+ Proximal            151      23           +------------------+--------+--------+-------+ Mid                  228      23           +------------------+--------+--------+-------+ Distal              254      42           +------------------+--------+--------+-------+ +-----------------+--------+--------+-------------------+ Left Renal ArteryPSV cm/sEDV cm/s      Comment       +-----------------+--------+--------+-------------------+ Origin                           unable to visualize +-----------------+--------+--------+-------------------+ Proximal            95      18                       +-----------------+--------+--------+-------------------+ Mid                 55      12                       +-----------------+--------+--------+-------------------+ Distal              59      13                       +-----------------+--------+--------+-------------------+ Technologist observations Renal Artery(s):Left kidney isoechoic lesion measuring 1.48 x 1.3 cm.  +------------+--------+--------+----+-----------+--------+--------+----+ Right KidneyPSV cm/sEDV cm/sRI  Left KidneyPSV cm/sEDV cm/sRI   +------------+--------+--------+----+-----------+--------+--------+----+ Upper Pole  36      10      0.73Upper Pole 44      12      0.74 +------------+--------+--------+----+-----------+--------+--------+----+ Mid         29      6       0.80Mid        35      10      0.70 +------------+--------+--------+----+-----------+--------+--------+----+ Lower Pole  31      10      0.69Lower Pole 40      12      0.69 +------------+--------+--------+----+-----------+--------+--------+----+ Hilar       120     28      0.77Hilar      39      16      0.59 +------------+--------+--------+----+-----------+--------+--------+----+ +------------------+-----+------------------+-----+ Right Kidney           Left Kidney             +------------------+-----+------------------+-----+ RAR                    RAR                      +------------------+-----+------------------+-----+ RAR (manual)           RAR (manual)            +------------------+-----+------------------+-----+  Cortex                 Cortex                  +------------------+-----+------------------+-----+ Cortex thickness       Corex thickness         +------------------+-----+------------------+-----+ Kidney length (cm)10.30Kidney length (cm)10.04 +------------------+-----+------------------+-----+  Summary: Renal:  Right: Evidence of a greater than 60% stenosis of the right renal        artery. Abnormal right Resistive Index. Left:  No evidence of left renal artery stenosis. Abnormal left        Resisitve Index.  *See table(s) above for measurements and observations.  Diagnosing physician: Servando Snare MD  Electronically signed by Servando Snare MD on 04/20/2018 at 1:56:32 PM.    Final     Labs: BMET Recent Labs  Lab 04/20/18 0336 04/20/18 1052 04/20/18 1421 04/20/18 1812 04/20/18 2159 04/21/18 0200 04/21/18 0539  NA 125* 128* 128* 127* 121* 127* 129*  K 3.1* 3.2* 3.0* 3.4* 3.4* 3.1* 3.2*  CL 90* 93* 92* 91* 87* 93* 94*  CO2 26 24 27 27 25 26 29   GLUCOSE 152* 163* 195* 178* 140* 148* 123*  BUN 26* 21 19 16 17 13 12   CREATININE 0.69 0.65 0.68 0.78 0.62 0.56 0.62  CALCIUM 8.6* 8.8* 9.0 8.7* 8.3* 8.6* 8.9   CBC Recent Labs  Lab 04/19/18 1002  WBC 10.6*  NEUTROABS 8.3*  HGB 12.4  HCT 35.3*  MCV 80.2  PLT 319    Medications:    . aspirin EC  81 mg Oral Daily  . cholecalciferol  2,000 Units Oral Daily  . donepezil  5 mg Oral QHS  . enoxaparin (LOVENOX) injection  40 mg Subcutaneous Q24H  . irbesartan  37.5 mg Oral Daily  . loratadine  10 mg Oral Daily  . morphine  15 mg Oral BID  . morphine  15 mg Oral QID  . nicotine  14 mg Transdermal Daily  . polyvinyl alcohol  1 drop Both Eyes BID  . QUEtiapine  12.5 mg Oral QHS      Sherene Sires, DO PGY2 04/21/2018, 7:02 AM

## 2018-04-21 NOTE — Progress Notes (Signed)
IV discontinued at this time. Patient tolerated well.

## 2018-04-21 NOTE — Progress Notes (Signed)
Discharge instructions reviewed with patient and patient's daughter. All questions answered.   Emelda Fear, RN

## 2018-04-21 NOTE — Progress Notes (Addendum)
Nutrition Consult/Brief Note  RD consulted for diet education for new onset diabetes.  Pt is not appropriate for education. She is pleasantly confused. Harvel Ricks, RN, CDE speaking with pt's daughter upon visit.  Briefly spoke with pt's daughter regarding importance of portion controlled carbohydrate intake throughout the day.   Provided "Carbohydrate Counting for People with Diabetes" handout from the Academy of Nutrition and Dietetics.  No further nutrition interventions warranted at this time.    If additional nutrition issues arise, please re-consult RD.  Arthur Holms, RD, LDN Pager #: [phone] After-Hours Pager #: [phone]

## 2018-04-21 NOTE — Progress Notes (Addendum)
Patient unable to tolerate IV potassium through peripheral site. Infusion stopped. MD made aware through Amion system at this time  K. Clement Husbands, RN

## 2018-04-21 NOTE — Care Management Note (Signed)
Case Management Note Marvetta Gibbons RN, BSN Transitions of Care Unit 4E- RN Case Manager [phone]  Patient Details  Name: RAWAN RIENDEAU MRN: [id number] Date of Birth: 03-05-1941  Subjective/Objective:     Pt admitted with confusion, hyponatremia               Action/Plan: PTA pt lived at home with family, per family pt has private duty caregivers most of day (all but 5 hr) - plan to return home with family- orders placed for HHRN/PT/OT/aide- (family declines aide)- choice offered for [facility] agency- they state pt has used [facility] in past and want to use them again- no DME needs. Referral called to Butch Penny with [facility] for HHRN/PT/OT- referral accepted.   Expected Discharge Date:  04/21/18               Expected Discharge Plan:  [place]  In-House Referral:     Discharge planning Services  CM Consult  Post Acute Care Choice:  Home Health Choice offered to:  Adult Children  DME Arranged:    DME Agency:     HH Arranged:  RN, PT, OT, Nurse's Aide [place] Agency:  [employer]  Status of Service:  Completed, signed off  If discussed at [place] of Stay Meetings, dates discussed:    Discharge Disposition: home/home health   Additional Comments:  Dawayne Patricia, RN 04/21/2018, 2:55 PM

## 2018-04-21 NOTE — Progress Notes (Signed)
Spoke with patient about the new onset diagnosis of diabetes. Daughter and son were in the the room with her. She was very sleepy and did not participate in the discussion.  Talked with daughter about patient's eating and how she manages at home with her. Daughter states that patient does eat a lot of ice cream, drinks lots of milk, and drinks diet sodas.  Daughter states that she has nursing help throughout the day and night. Given information on general DM care, die information, and ordered Living Well with Diabetes booklet. Explained significance of HgbA1C of 8.4%. Daughter seemed very encouraged and able to help with patient's care.   Harvel Ricks RN BSN CDE Diabetes Coordinator Pager: [phone]  8am-5pm

## 2018-04-23 NOTE — Assessment & Plan Note (Deleted)
Preventative protocols reviewed and updated unless pt declined. Discussed healthy diet and lifestyle.  

## 2018-04-23 NOTE — Progress Notes (Signed)
BP 118/62 (BP Location: Left Arm, Patient Position: Sitting, Cuff Size: Normal)   Pulse 74   Temp 98.6 F (37 C) (Oral)   Ht 5' (1.524 m)   Wt 150 lb (68 kg)   SpO2 94%   BMI 29.29 kg/m    CC: CPE converted to TCM Subjective:    Patient ID: Meredith Gutierrez, female    DOB: 26-Dec-1940, 77 y.o.   MRN: [id number]  HPI: Meredith Gutierrez is a 77 y.o. female presenting on 04/24/2018 for Annual Exam (Pt 2. Pt accompanied by her son, Marlou Sa. )   Here with son Marlou Sa today.   Recent hospitalization for severe hyponatremia and confusion. Nephrology consulted thought diuretic related (had been on metolazone titration for diastolic CHF). Sodium repleted with IVF. Hydroxyzine and ativan were discontinued, she was placed on seroquel at bedtime. Seroquel hasn't helped sleep. IV ativan in hospital was very helpful for sleep. Cardizem and metolazone were also discontinued. ?RAS (renal artery duplex showed >60% stenosis R side) - fibromuscular dysplasia rec outpatient f/u. Planning to f/u with cards. Only on irbesartan antihypertensive on discharge. Lasix 40mg  daily was also continued.   Found to have diabetes with A1c 8.4% - had not been restricting diet, likely eating too large portions and candies and sweets. Family has significantly changed diet - only offering sugar free sweets. Drinks 4 gallons of milk per week.   Stays tired. Denies dyspnea. Not sleeping well at night, taking naps during the day. Ongoing leg swelling. Per cards goal weight 140lbs.   Saw Lesia 2 weeks ago for medicare wellness visit. Note reviewed.  Failed hearing screen L>R.   Preventative: Colon cancer screening - cologuard negative 04/2017.  Lung cancer screening - not eligible. 1 pack/wk x about 30 yrs.  Breast cancer screening - 03/2015 WNL. Due for rpt. Will call [facility] to schedule mammogram. Does breast exams at home.  Well woman exam - s/p hysterectomy, ovaries removed age 48, s/p HRT.  DEXA scan - 2017, normal per  daughter done by Dr Chalmers Cater, will request records. Flu shot yearly  pnumovax 2010, prevnar 2016 zostavax 2015 shingrix - discussed Advanced directive: living will/HCPOA scanned and in chart 03/2017. HCPOA are daughter [name] and son Charlotte Crumb. Does not want prolonged life support if terminal condition.  Seat belt use discussed Sunscreen use discussed, no changing moles on skin. H/o melanoma. rec yearly derm eval. Smoker - 1 pack per week Alcohol - none  Widow Lives with daughter and daughter's GF Edu: HS Occ: retired, worked at cafe Activity: no regular exercise Diet: good water   Admit date: 04/19/2018 Discharge date: 04/21/2018 TCM not completed, but pt seen next business day from discharge  Recommendations for Outpatient Follow-up:  1. Needs outpatient repeat Chem-7 and CBC 2. Needs outpatient follow-up with cardiology-will C Dr. Orene Desanctis who has seen her in the past as patient has renal artery stenosis and may need further work-up as outpatient 3. Note medication changes and discontinuation of hydroxyzine and Ativan placed on low-dose Seroquel this admission for behavioral disturbances at night-- Cardizem was DC'd as well in addition to metolazone-also placed on potassium replacement needs probably goals of care as outpatient if no better  Discharge Diagnoses:  Principal Problem:   Hyponatremia Active Problems:   Essential hypertension   Hx of CABG   Dementia (HCC)   History of melanoma   Vitamin D deficiency   Decreased hearing, bilateral   Esophageal dysphagia   Chronic nasal congestion   Hypokalemia  COPD (chronic obstructive pulmonary disease) ([place])  Discharge Condition: improved Diet recommendation: Regular  Relevant past medical, surgical, family and social history reviewed and updated as indicated. Interim medical history since our last visit reviewed. Allergies and medications reviewed and updated. Discharge meds reconciled with current home regimen Outpatient  Medications Prior to Visit  Medication Sig Dispense Refill  . albuterol (PROVENTIL) (2.5 MG/3ML) 0.083% nebulizer solution Take 3 mLs (2.5 mg total) by nebulization every 6 (six) hours as needed for wheezing or shortness of breath. 75 mL 1  . aspirin EC 81 MG tablet Take 81 mg by mouth daily.    . candesartan (ATACAND) 16 MG tablet Take 0.5 tablets (8 mg total) by mouth daily. 30 tablet 6  . carboxymethylcellulose (REFRESH PLUS) 0.5 % SOLN Place 1 drop into both eyes 2 (two) times daily.    . cetirizine (ZYRTEC) 10 MG tablet Take 10 mg by mouth daily.    . Cholecalciferol (VITAMIN D) 2000 units CAPS Take 1 capsule (2,000 Units total) by mouth daily. 30 capsule   . donepezil (ARICEPT) 5 MG tablet TAKE ONE TABLET BY MOUTH AT BEDTIME 30 tablet 3  . morphine (MS CONTIN) 15 MG 12 hr tablet Take 1 tablet by mouth 2 (two) times daily.    . potassium chloride SA (K-DUR,KLOR-CON) 20 MEQ tablet Take 2 tablets (40 mEq total) by mouth 2 (two) times daily. 16 tablet 0  . furosemide (LASIX) 40 MG tablet Take 4 tablets (80mg ) daily until weight gets to 140lb, then go to 2 tablets (40mg ) daily 16 tablet 6  . QUEtiapine (SEROQUEL) 25 MG tablet Take 0.5 tablets (12.5 mg total) by mouth at bedtime. 30 tablet 1   No facility-administered medications prior to visit.      Per HPI unless specifically indicated in ROS section below Review of Systems       Objective:    BP 118/62 (BP Location: Left Arm, Patient Position: Sitting, Cuff Size: Normal)   Pulse 74   Temp 98.6 F (37 C) (Oral)   Ht 5' (1.524 m)   Wt 150 lb (68 kg)   SpO2 94%   BMI 29.29 kg/m   Wt Readings from Last 3 Encounters:  04/24/18 150 lb (68 kg)  04/20/18 150 lb 3.2 oz (68.1 kg)  04/13/18 148 lb (67.1 kg)    Physical Exam  Constitutional: She appears well-developed and well-nourished. No distress.  HENT:  Mouth/Throat: Oropharynx is clear and moist. No oropharyngeal exudate.  Eyes: Pupils are equal, round, and reactive to light.  EOM are normal.  Cardiovascular: Normal rate and regular rhythm.  Murmur (3/6 systolic) heard. Pulmonary/Chest: Effort normal and breath sounds normal. No respiratory distress. She has no wheezes. She has no rales.  Musculoskeletal: She exhibits no edema (1+ pitting).  Skin: Skin is warm and dry. No rash noted. There is erythema.  Erythema and mild induration surrounding scab of lower abdomen, tender  Nursing note and vitals reviewed.  Lab Results  Component Value Date   CREATININE 0.62 04/21/2018   BUN 12 04/21/2018   NA 129 (L) 04/21/2018   K 3.2 (L) 04/21/2018   CL 94 (L) 04/21/2018   CO2 29 04/21/2018       Assessment & Plan:   Problem List Items Addressed This Visit    Uncontrolled diabetes mellitus type 2 without complications ([place])    New diagnosis. Reviewed with pt and son. Recommend diet and lifestyle as initial steps to achieve glycemic control. Reassess at f/u visit. Will  avoid medication at this time. Diabetes and nutrition handout provided today.       Systolic murmur    Likely due to aortic sclerosis not stenosis by latest [place] 08/2017. Will continue to monitor.       Sundowning    With increased daytime napping. Discussed routine sleep hygiene measures. Will stop seroquel - ineffective and concern in recent dx diabetes. Son states lorazepam was effective during hospitalization - will trial 0.5mg  nightly PRN sleep. Reviewed risks of benzo use with chronic narcotic therapy.       Hyponatremia - Primary    Severe, in setting of metolazone use. Now off this, lasix continued, unclear dosing - will continue lasix 80mg  daily. Will update labs today. Unclear why she needed CBC rechecked.       Relevant Orders   Basic metabolic panel   Hypokalemia    Reports compliance with Kdur 60mEq BID      Fibromuscular dysplasia of renal artery ([place])    Planned f/u with cards - advised to call and schedule appt       Relevant Medications   furosemide (LASIX) 40 MG tablet    Essential hypertension    Chronic, stable on current regimen off diltiazem.       Relevant Medications   furosemide (LASIX) 40 MG tablet   Dementia (HCC)   Relevant Medications   LORazepam (ATIVAN) 0.5 MG tablet   Cellulitis of abdominal wall    Lower abdominal cellulitis with induration but no fluctuance so did not attempt I&D. Treat with doxycycline 1 wk course, recommended warm compresses as well. Reassess at f/u visit      Acute on chronic diastolic heart failure ([place])    Planned f/u with cards - advised to call and schedule appt for further recommendations on dCHF regimen. Goal dry weight seems to be 140 lbs.       Relevant Medications   furosemide (LASIX) 40 MG tablet       Meds ordered this encounter  Medications  . LORazepam (ATIVAN) 0.5 MG tablet    Sig: Take 1 tablet (0.5 mg total) by mouth at bedtime as needed for anxiety or sleep.    Dispense:  30 tablet    Refill:  1  . furosemide (LASIX) 40 MG tablet    Sig: Take 2 tablets (80 mg total) by mouth daily.    Dispense:  60 tablet    Refill:  1    Note new sig  . doxycycline (VIBRA-TABS) 100 MG tablet    Sig: Take 1 tablet (100 mg total) by mouth 2 (two) times daily.    Dispense:  14 tablet    Refill:  0   Orders Placed This Encounter  Procedures  . Basic metabolic panel    Follow up plan: Return in about 6 weeks (around 06/05/2018) for follow up visit.  Ria Bush, MD

## 2018-04-24 ENCOUNTER — Telehealth: Payer: Self-pay | Admitting: Cardiovascular Disease

## 2018-04-24 ENCOUNTER — Ambulatory Visit (INDEPENDENT_AMBULATORY_CARE_PROVIDER_SITE_OTHER): Payer: Medicare HMO | Admitting: Family Medicine

## 2018-04-24 ENCOUNTER — Encounter: Payer: Self-pay | Admitting: Family Medicine

## 2018-04-24 VITALS — BP 118/62 | HR 74 | Temp 98.6°F | Ht 60.0 in | Wt 150.0 lb

## 2018-04-24 DIAGNOSIS — E1165 Type 2 diabetes mellitus with hyperglycemia: Secondary | ICD-10-CM | POA: Diagnosis not present

## 2018-04-24 DIAGNOSIS — E871 Hypo-osmolality and hyponatremia: Secondary | ICD-10-CM | POA: Diagnosis not present

## 2018-04-24 DIAGNOSIS — E876 Hypokalemia: Secondary | ICD-10-CM

## 2018-04-24 DIAGNOSIS — F0150 Vascular dementia without behavioral disturbance: Secondary | ICD-10-CM

## 2018-04-24 DIAGNOSIS — IMO0001 Reserved for inherently not codable concepts without codable children: Secondary | ICD-10-CM

## 2018-04-24 DIAGNOSIS — E118 Type 2 diabetes mellitus with unspecified complications: Secondary | ICD-10-CM | POA: Insufficient documentation

## 2018-04-24 DIAGNOSIS — I5033 Acute on chronic diastolic (congestive) heart failure: Secondary | ICD-10-CM

## 2018-04-24 DIAGNOSIS — L03311 Cellulitis of abdominal wall: Secondary | ICD-10-CM

## 2018-04-24 DIAGNOSIS — R011 Cardiac murmur, unspecified: Secondary | ICD-10-CM | POA: Insufficient documentation

## 2018-04-24 DIAGNOSIS — I10 Essential (primary) hypertension: Secondary | ICD-10-CM | POA: Diagnosis not present

## 2018-04-24 DIAGNOSIS — I773 Arterial fibromuscular dysplasia: Secondary | ICD-10-CM | POA: Diagnosis not present

## 2018-04-24 DIAGNOSIS — E1169 Type 2 diabetes mellitus with other specified complication: Secondary | ICD-10-CM | POA: Insufficient documentation

## 2018-04-24 DIAGNOSIS — F05 Delirium due to known physiological condition: Secondary | ICD-10-CM

## 2018-04-24 LAB — BASIC METABOLIC PANEL
BUN: 45 mg/dL — ABNORMAL HIGH (ref 6–23)
CO2: 27 mEq/L (ref 19–32)
Calcium: 9.1 mg/dL (ref 8.4–10.5)
Chloride: 91 mEq/L — ABNORMAL LOW (ref 96–112)
Creatinine, Ser: 0.85 mg/dL (ref 0.40–1.20)
GFR: 68.52 mL/min (ref >60.00)
GLUCOSE: 192 mg/dL — AB (ref 70–99)
POTASSIUM: 3.8 meq/L (ref 3.5–5.1)
SODIUM: 126 meq/L — AB (ref 135–145)

## 2018-04-24 MED ORDER — DOXYCYCLINE HYCLATE 100 MG PO TABS: 100.0000 mg | ORAL_TABLET | Freq: Two times a day (BID) | ORAL | 0 refills | Status: DC

## 2018-04-24 MED ORDER — FUROSEMIDE 40 MG PO TABS: 80.0000 mg | ORAL_TABLET | Freq: Every day | ORAL | 1 refills | Status: DC

## 2018-04-24 MED ORDER — LORAZEPAM 0.5 MG PO TABS: 0.5000 mg | ORAL_TABLET | Freq: Every evening | ORAL | 1 refills | Status: DC | PRN

## 2018-04-24 NOTE — Assessment & Plan Note (Signed)
With increased daytime napping. Discussed routine sleep hygiene measures. Will stop seroquel - ineffective and concern in recent dx diabetes. Son states lorazepam was effective during hospitalization - will trial 0.5mg  nightly PRN sleep. Reviewed risks of benzo use with chronic narcotic therapy.

## 2018-04-24 NOTE — Assessment & Plan Note (Signed)
Lower abdominal cellulitis with induration but no fluctuance so did not attempt I&D. Treat with doxycycline 1 wk course, recommended warm compresses as well. Reassess at f/u visit

## 2018-04-24 NOTE — Assessment & Plan Note (Addendum)
Severe, in setting of metolazone use. Now off this, lasix continued, unclear dosing - will continue lasix 80mg  daily. Will update labs today. Unclear why she needed CBC rechecked.

## 2018-04-24 NOTE — Patient Instructions (Addendum)
Labs today. Work on low sugar low carb diet over the next 3 months. Limit added sugars and sweetened beverages in the diet. We will reassess at follow up visit.  Check with insurance on preferred glucometer brand - and let me know.  For leg swelling - elevate legs, consider ACE wraps, avoiding salt in the diet. Continue lasix 40mg  2 tablets daily (80mg  total).  Antibiotic 7 day course for spot on abdomen.   Diabetes Mellitus and Nutrition When you have diabetes (diabetes mellitus), it is very important to have healthy eating habits because your blood sugar (glucose) levels are greatly affected by what you eat and drink. Eating healthy foods in the appropriate amounts, at about the same times every day, can help you:  Control your blood glucose.  Lower your risk of heart disease.  Improve your blood pressure.  Reach or maintain a healthy weight.  Every person with diabetes is different, and each person has different needs for a meal plan. Your health care provider may recommend that you work with a diet and nutrition specialist (dietitian) to make a meal plan that is best for you. Your meal plan may vary depending on factors such as:  The calories you need.  The medicines you take.  Your weight.  Your blood glucose, blood pressure, and cholesterol levels.  Your activity level.  Other health conditions you have, such as heart or kidney disease.  How do carbohydrates affect me? Carbohydrates affect your blood glucose level more than any other type of food. Eating carbohydrates naturally increases the amount of glucose in your blood. Carbohydrate counting is a method for keeping track of how many carbohydrates you eat. Counting carbohydrates is important to keep your blood glucose at a healthy level, especially if you use insulin or take certain oral diabetes medicines. It is important to know how many carbohydrates you can safely have in each meal. This is different for every person.  Your dietitian can help you calculate how many carbohydrates you should have at each meal and for snack. Foods that contain carbohydrates include:  Bread, cereal, rice, pasta, and crackers.  Potatoes and corn.  Peas, beans, and lentils.  Milk and yogurt.  Fruit and juice.  Desserts, such as cakes, cookies, ice cream, and candy.  How does alcohol affect me? Alcohol can cause a sudden decrease in blood glucose (hypoglycemia), especially if you use insulin or take certain oral diabetes medicines. Hypoglycemia can be a life-threatening condition. Symptoms of hypoglycemia (sleepiness, dizziness, and confusion) are similar to symptoms of having too much alcohol. If your health care provider says that alcohol is safe for you, follow these guidelines:  Limit alcohol intake to no more than 1 drink per day for nonpregnant women and 2 drinks per day for men. One drink equals 12 oz of beer, 5 oz of wine, or 1 oz of hard liquor.  Do not drink on an empty stomach.  Keep yourself hydrated with water, diet soda, or unsweetened iced tea.  Keep in mind that regular soda, juice, and other mixers may contain a lot of sugar and must be counted as carbohydrates.  What are tips for following this plan? Reading food labels  Start by checking the serving size on the label. The amount of calories, carbohydrates, fats, and other nutrients listed on the label are based on one serving of the food. Many foods contain more than one serving per package.  Check the total grams (g) of carbohydrates in one serving. You can  calculate the number of servings of carbohydrates in one serving by dividing the total carbohydrates by 15. For example, if a food has 30 g of total carbohydrates, it would be equal to 2 servings of carbohydrates.  Check the number of grams (g) of saturated and trans fats in one serving. Choose foods that have low or no amount of these fats.  Check the number of milligrams (mg) of sodium in one  serving. Most people should limit total sodium intake to less than 2,300 mg per day.  Always check the nutrition information of foods labeled as "low-fat" or "nonfat". These foods may be higher in added sugar or refined carbohydrates and should be avoided.  Talk to your dietitian to identify your daily goals for nutrients listed on the label. Shopping  Avoid buying canned, premade, or processed foods. These foods tend to be high in fat, sodium, and added sugar.  Shop around the outside edge of the grocery store. This includes fresh fruits and vegetables, bulk grains, fresh meats, and fresh dairy. Cooking  Use low-heat cooking methods, such as baking, instead of high-heat cooking methods like deep frying.  Cook using healthy oils, such as olive, canola, or sunflower oil.  Avoid cooking with butter, cream, or high-fat meats. Meal planning  Eat meals and snacks regularly, preferably at the same times every day. Avoid going long periods of time without eating.  Eat foods high in fiber, such as fresh fruits, vegetables, beans, and whole grains. Talk to your dietitian about how many servings of carbohydrates you can eat at each meal.  Eat 4-6 ounces of lean protein each day, such as lean meat, chicken, fish, eggs, or tofu. 1 ounce is equal to 1 ounce of meat, chicken, or fish, 1 egg, or 1/4 cup of tofu.  Eat some foods each day that contain healthy fats, such as avocado, nuts, seeds, and fish. Lifestyle   Check your blood glucose regularly.  Exercise at least 30 minutes 5 or more days each week, or as told by your health care provider.  Take medicines as told by your health care provider.  Do not use any products that contain nicotine or tobacco, such as cigarettes and e-cigarettes. If you need help quitting, ask your health care provider.  Work with a Social worker or diabetes educator to identify strategies to manage stress and any emotional and social challenges. What are some  questions to ask my health care provider?  Do I need to meet with a diabetes educator?  Do I need to meet with a dietitian?  What number can I call if I have questions?  When are the best times to check my blood glucose? Where to find more information:  American Diabetes Association: diabetes.org/food-and-fitness/food  Academy of Nutrition and Dietetics: PokerClues.dk  [employer] of Diabetes and Digestive and Kidney Diseases (NIH): ContactWire.be Summary  A healthy meal plan will help you control your blood glucose and maintain a healthy lifestyle.  Working with a diet and nutrition specialist (dietitian) can help you make a meal plan that is best for you.  Keep in mind that carbohydrates and alcohol have immediate effects on your blood glucose levels. It is important to count carbohydrates and to use alcohol carefully. This information is not intended to replace advice given to you by your health care provider. Make sure you discuss any questions you have with your health care provider. Document Released: 02/25/2005 Document Revised: 07/05/2016 Document Reviewed: 07/05/2016 Elsevier Interactive Patient Education  [employer].

## 2018-04-24 NOTE — Assessment & Plan Note (Addendum)
Planned f/u with cards - advised to call and schedule appt

## 2018-04-24 NOTE — Telephone Encounter (Signed)
Returned call to patient's daughter [facility].

## 2018-04-24 NOTE — Assessment & Plan Note (Addendum)
New diagnosis. Reviewed with pt and son. Recommend diet and lifestyle as initial steps to achieve glycemic control. Reassess at f/u visit. Will avoid medication at this time. Diabetes and nutrition handout provided today.

## 2018-04-24 NOTE — Consult Note (Signed)
            [facility] CM Primary Care Navigator  04/24/2018  Meredith Gutierrez December 11, 1940 [id number]   Attempt to seepatient at the bedside to identify possible discharge needs butshewasalreadydischargedtowards the weekend. Patient was discharged home with home health services.  Per MD note,patient was admitted with hyponatremia (secondary to sodium wasting) and confusion.   Patient has discharge instruction to follow-up withcardiology as she has renal artery stenosis and may need further work-up as outpatient per MD.  Primary care provider's office is listed as providing transition of care (TOC) follow-up.  Inpatient DM coordinator had followed- up with patient and daughter on this admission.   For additional questions please contact:  Edwena Felty A. Tima Curet, BSN, RN-BC [facility] PRIMARY CARE Navigator Cell: [phone]

## 2018-04-24 NOTE — Telephone Encounter (Signed)
Received call from patient's daughter [name].She stated mother's Diltiazem and Metolazone was stopped in hospital.She wanted to make sure Dr.Croitoru was ok with stopping. Advised I will send message to Dr.Croitoru.

## 2018-04-24 NOTE — Telephone Encounter (Signed)
New Message:     Pt was discharged from the hospital on Friday. Pt was taken off of her Diltiazem and daughter is concerned about this.

## 2018-04-24 NOTE — Telephone Encounter (Signed)
It was necessary to stop the metolazone due to dangerously low sodium and potassium levels. Hopefully being off diltiazem will reduce the likelihood of leg swelling so we do not need powerful diuretics again. Please let me know weight and BP. MCr

## 2018-04-24 NOTE — Assessment & Plan Note (Signed)
Planned f/u with cards - advised to call and schedule appt for further recommendations on dCHF regimen. Goal dry weight seems to be 140 lbs.

## 2018-04-24 NOTE — Assessment & Plan Note (Signed)
Reports compliance with Kdur 25mEq BID

## 2018-04-24 NOTE — Assessment & Plan Note (Signed)
Chronic, stable on current regimen off diltiazem.

## 2018-04-24 NOTE — Assessment & Plan Note (Addendum)
Likely due to aortic sclerosis not stenosis by latest [place] 08/2017. Will continue to monitor.

## 2018-04-25 ENCOUNTER — Other Ambulatory Visit: Payer: Self-pay

## 2018-04-25 NOTE — Patient Outreach (Signed)
[facility]) Care Management  04/25/2018  Meredith Gutierrez 01/30/1941 [id number]     Transition of Care Referral  Referral Date: 04/25/18 Referral Source: [employer] Discharge Report Date of Admission: 04/19/18 Diagnosis: "hyponatremia" Date of Discharge: 04/21/18 Facility: [facility]   Referral received. No outreach warranted at this time. TOC will be completed by primary care provider office who will refer to [facility] care mgmt if needed.    Plan: RN CM will close case at this time.    Enzo Montgomery, RN,BSN,CCM [facility] Telephonic Care Management Coordinator Direct Phone: [phone] Toll Free: [phone] Fax: [phone]

## 2018-04-26 ENCOUNTER — Ambulatory Visit
Admission: RE | Admit: 2018-04-26 | Discharge: 2018-04-26 | Disposition: A | Discharge status: Home / Self Care | Payer: Medicare HMO | Source: Ambulatory Visit | Attending: Endocrinology | Admitting: Endocrinology

## 2018-04-26 DIAGNOSIS — E0590 Thyrotoxicosis, unspecified without thyrotoxic crisis or storm: Secondary | ICD-10-CM | POA: Diagnosis not present

## 2018-04-26 DIAGNOSIS — E049 Nontoxic goiter, unspecified: Secondary | ICD-10-CM | POA: Diagnosis not present

## 2018-04-26 NOTE — Telephone Encounter (Signed)
Returned call to patient's daughter [name].Dr.Croitoru's advice given.She stated mother's weight is about the same and B/P has been normal.Stated she has a nurse who sits with mother during the day and also at night.Advised to keep appointment as planned with Dr.Croitoru 05/08/18 at 11:40 am.Advised to bring a list of weights and B/P readings to appointment.

## 2018-04-28 ENCOUNTER — Encounter: Payer: Self-pay | Admitting: Gastroenterology

## 2018-04-28 ENCOUNTER — Ambulatory Visit (AMBULATORY_SURGERY_CENTER): Payer: Medicare HMO | Admitting: Gastroenterology

## 2018-04-28 VITALS — BP 125/60 | HR 74 | Temp 97.8°F | Resp 22 | Ht 60.0 in | Wt 150.0 lb

## 2018-04-28 DIAGNOSIS — R1310 Dysphagia, unspecified: Secondary | ICD-10-CM

## 2018-04-28 DIAGNOSIS — K210 Gastro-esophageal reflux disease with esophagitis: Secondary | ICD-10-CM

## 2018-04-28 MED ORDER — FAMOTIDINE 20 MG PO TABS: 20.0000 mg | ORAL_TABLET | Freq: Two times a day (BID) | ORAL | 3 refills | Status: DC

## 2018-04-28 MED ORDER — SODIUM CHLORIDE 0.9 % IV SOLN: 500.0000 mL | Freq: Once | INTRAVENOUS | Status: DC

## 2018-04-28 NOTE — Progress Notes (Signed)
Called to room to assist during endoscopic procedure.  Patient ID and intended procedure confirmed with present staff. Received instructions for my participation in the procedure from the performing physician.  

## 2018-04-28 NOTE — Patient Instructions (Signed)
YOU HAD AN ENDOSCOPIC PROCEDURE TODAY AT THE [facility]:   Refer to the procedure report that was given to you for any specific questions about what was found during the examination.  If the procedure report does not answer your questions, please call your gastroenterologist to clarify.  If you requested that your care partner not be given the details of your procedure findings, then the procedure report has been included in a sealed envelope for you to review at your convenience later.  YOU SHOULD EXPECT: Some feelings of bloating in the abdomen. Passage of more gas than usual.  Walking can help get rid of the air that was put into your GI tract during the procedure and reduce the bloating. If you had a lower endoscopy (such as a colonoscopy or flexible sigmoidoscopy) you may notice spotting of blood in your stool or on the toilet paper. If you underwent a bowel prep for your procedure, you may not have a normal bowel movement for a few days.  Please Note:  You might notice some irritation and congestion in your nose or some drainage.  This is from the oxygen used during your procedure.  There is no need for concern and it should clear up in a day or so.  SYMPTOMS TO REPORT IMMEDIATELY:   Following upper endoscopy (EGD)  Vomiting of blood or coffee ground material  New chest pain or pain under the shoulder blades  Painful or persistently difficult swallowing  New shortness of breath  Fever of 100F or higher  Black, tarry-looking stools  For urgent or emergent issues, a gastroenterologist can be reached at any hour by calling [phone].   DIET:  We do recommend a small meal at first, but then you may proceed to your regular diet.  Drink plenty of fluids but you should avoid alcoholic beverages for 24 hours.  ACTIVITY:  You should plan to take it easy for the rest of today and you should NOT DRIVE or use heavy machinery until tomorrow (because of the sedation medicines used  during the test).    FOLLOW UP: Our staff will call the number listed on your records the next business day following your procedure to check on you and address any questions or concerns that you may have regarding the information given to you following your procedure. If we do not reach you, we will leave a message.  However, if you are feeling well and you are not experiencing any problems, there is no need to return our call.  We will assume that you have returned to your regular daily activities without incident.  If any biopsies were taken you will be contacted by phone or by letter within the next 1-3 weeks.  Please call us at [phone] if you have not heard about the biopsies in 3 weeks.    SIGNATURES/CONFIDENTIALITY: You and/or your care partner have signed paperwork which will be entered into your electronic medical record.  These signatures attest to the fact that that the information above on your After Visit Summary has been reviewed and is understood.  Full responsibility of the confidentiality of this discharge information lies with you and/or your care-partner. 

## 2018-04-28 NOTE — Op Note (Addendum)
[facility] Patient Name: Meredith Gutierrez Procedure Date: 04/28/2018 10:52 AM MRN: [id number] Endoscopist: Remo Lipps P. Havery Moros , MD Age: 77 Referring MD:  Date of Birth: 1941-03-10 Gender: Female Account #: [id number] Procedure:                Upper GI endoscopy Indications:              Dysphagia to pills and solids, barium swallow                            suggests dysmotility, Medicines:                Monitored Anesthesia Care Procedure:                Pre-Anesthesia Assessment:                           - Prior to the procedure, a History and Physical                            was performed, and patient medications and                            allergies were reviewed. The patient's tolerance of                            previous anesthesia was also reviewed. The risks                            and benefits of the procedure and the sedation                            options and risks were discussed with the patient.                            All questions were answered, and informed consent                            was obtained. Prior Anticoagulants: The patient has                            taken no previous anticoagulant or antiplatelet                            agents. ASA Grade Assessment: III - A patient with                            severe systemic disease. After reviewing the risks                            and benefits, the patient was deemed in                            satisfactory condition to undergo the procedure.  After obtaining informed consent, the endoscope was                            passed under direct vision. Throughout the                            procedure, the patient's blood pressure, pulse, and                            oxygen saturations were monitored continuously. The                            Model GIF-[id number]) scope was introduced                            through the mouth, and  advanced to the second part                            of duodenum. The upper GI endoscopy was                            accomplished without difficulty. The patient                            tolerated the procedure well. Scope In: Scope Out: Findings:                 Esophagogastric landmarks were identified: the                            Z-line was found at 37 cm, the gastroesophageal                            junction was found at 37 cm and the upper extent of                            the gastric folds was found at 37 cm from the                            incisors.                           LA Grade A esophagitis was found at the GEJ.                           A single small benign appearing nodule was found at                            the gastroesophageal junction (3-77mm). Biopsies                            were taken with a cold forceps for histology.  The exam of the esophagus was otherwise normal.                           A guidewire was placed and the scope was withdrawn.                            Empiric dilation was performed in the entire                            esophagus with a Savary dilator with mild                            resistance at 17 mm and 18 mm - relook endoscopy                            showed no mucosal wrents                           The entire examined stomach was normal.                           The duodenal bulb and second portion of the                            duodenum were normal. Complications:            No immediate complications. Estimated blood loss:                            Minimal. Estimated Blood Loss:     Estimated blood loss was minimal. Impression:               - Esophagogastric landmarks identified.                           - LA Grade A reflux esophagitis.                           - Benign appearing nodule found at the GEJ.                            Biopsied.                           -  Normal esophagus otherwise - empiric dilation                            performed to 22mm.                           - Normal stomach.                           - Normal duodenal bulb and second portion of the  duodenum.                           Overall suspect dysmotility could be driving                            dysphagia. Empiric dilation performed to treat any                            cricopharyngeal component Recommendation:           - Patient has a contact number available for                            emergencies. The signs and symptoms of potential                            delayed complications were discussed with the                            patient. Return to normal activities tomorrow.                            Written discharge instructions were provided to the                            patient.                           - Resume previous diet.                           - Continue present medications.                           - Start pepcid 20mg  BID for mild esophagitis                           - Await pathology results and course post dilation Steven P. Armbruster, MD 04/28/2018 11:12:34 AM This report has been signed electronically.

## 2018-04-28 NOTE — Progress Notes (Signed)
PT taken to PACU. Monitors in place. VSS. Report given to RN. 

## 2018-05-01 ENCOUNTER — Telehealth: Payer: Self-pay | Admitting: *Deleted

## 2018-05-01 ENCOUNTER — Telehealth: Payer: Self-pay

## 2018-05-01 NOTE — Telephone Encounter (Signed)
Patient's daughter [name] left a message on voicemail for Dr.Gutierrez's assistant to call her back about the glucose meter that needs to be ordered for her mom.

## 2018-05-01 NOTE — Telephone Encounter (Signed)
Spoke with pt's daughter, [name] (on dpr), about getting pt a glucometer.  Per [name], the insurance says the provider can send rx for any meter and pt will only have to pt 10%.  Told her I will send in rx for the meter and supplies.  Verbalizes understanding.

## 2018-05-01 NOTE — Telephone Encounter (Signed)
  Follow up Call-  Call back number 04/28/2018  Post procedure Call Back phone  # [phone] (sybil her nurse will answer the phone)  Permission to leave phone message Yes  Some recent data might be hidden     Patient questions:  Do you have a fever, pain , or abdominal swelling? No. Pain Score  0 *  Have you tolerated food without any problems? Yes.    Have you been able to return to your normal activities? Yes.    Do you have any questions about your discharge instructions: Diet   No. Medications  No. Follow up visit  No.  Do you have questions or concerns about your Care? No.  Actions: * If pain score is 4 or above: No action needed, pain <4.

## 2018-05-02 MED ORDER — GLUCOSE BLOOD VI STRP: 1.0000 | ORAL_STRIP | 0 refills | Status: DC | PRN

## 2018-05-02 MED ORDER — ACCU-CHEK AVIVA PLUS W/DEVICE KIT: 1.0000 | PACK | Freq: Every day | 0 refills | Status: DC

## 2018-05-02 MED ORDER — BD SWAB SINGLE USE REGULAR PADS: 1.0000 | MEDICATED_PAD | Freq: Every day | 0 refills | Status: DC

## 2018-05-02 MED ORDER — ACCU-CHEK FASTCLIX LANCETS MISC: 1.0000 | Freq: Every day | 0 refills | Status: DC

## 2018-05-02 NOTE — Telephone Encounter (Signed)
E-scribed rx for Accu-Chek Aviva Plus meter, test strips, Accu-Chek FastClix lancets and alcohol swabs for pt to check BS once daily.

## 2018-05-03 DIAGNOSIS — E0590 Thyrotoxicosis, unspecified without thyrotoxic crisis or storm: Secondary | ICD-10-CM | POA: Diagnosis not present

## 2018-05-03 DIAGNOSIS — I10 Essential (primary) hypertension: Secondary | ICD-10-CM | POA: Diagnosis not present

## 2018-05-03 DIAGNOSIS — M8580 Other specified disorders of bone density and structure, unspecified site: Secondary | ICD-10-CM | POA: Diagnosis not present

## 2018-05-08 ENCOUNTER — Ambulatory Visit: Payer: Medicare HMO | Admitting: Cardiovascular Disease

## 2018-05-08 ENCOUNTER — Telehealth: Payer: Self-pay | Admitting: Cardiovascular Disease

## 2018-05-08 ENCOUNTER — Encounter: Payer: Self-pay | Admitting: Cardiovascular Disease

## 2018-05-08 VITALS — BP 102/70 | HR 76 | Ht 60.0 in | Wt 145.0 lb

## 2018-05-08 DIAGNOSIS — I10 Essential (primary) hypertension: Secondary | ICD-10-CM

## 2018-05-08 DIAGNOSIS — E871 Hypo-osmolality and hyponatremia: Secondary | ICD-10-CM | POA: Diagnosis not present

## 2018-05-08 DIAGNOSIS — I2510 Atherosclerotic heart disease of native coronary artery without angina pectoris: Secondary | ICD-10-CM | POA: Diagnosis not present

## 2018-05-08 DIAGNOSIS — E7800 Pure hypercholesterolemia, unspecified: Secondary | ICD-10-CM | POA: Diagnosis not present

## 2018-05-08 DIAGNOSIS — I5033 Acute on chronic diastolic (congestive) heart failure: Secondary | ICD-10-CM

## 2018-05-08 DIAGNOSIS — Z8679 Personal history of other diseases of the circulatory system: Secondary | ICD-10-CM

## 2018-05-08 DIAGNOSIS — E119 Type 2 diabetes mellitus without complications: Secondary | ICD-10-CM | POA: Diagnosis not present

## 2018-05-08 MED ORDER — FUROSEMIDE 80 MG PO TABS: ORAL_TABLET | ORAL | 3 refills | Status: DC

## 2018-05-08 NOTE — Progress Notes (Signed)
Cardiology Office Note    Date:  05/08/2018   ID:  Meredith Gutierrez, DOB Apr 04, 1941, MRN [id number]  PCP:  Ria Bush, MD  Cardiologist:   Sanda Klein, MD   Chief Complaint  Patient presents with  . Hospitalization Follow-up    pt c/o swelling in feet aand ankles    History of Present Illness:  Meredith Gutierrez is a 77 y.o. female with CAD S/P CABG (2003 LIMA to LAD and SVG to first diagonal, ligation of large LAD artery aneurysm, atretic LIMA with patent LAD at cardiac cath in 2013), without coronary events since that time. She has hyperdynamic left ventricular systolic function, moderate LVH, chronic diastolic heart failure, hyperlipidemia intolerant to numerous attempts at lipid lowering therapy (rosuvastatin, simvastatin, Zetia and others). She is unable to take beta blockers due to bradycardia.   She was briefly hospitalized November 6-November 8 for severe hyponatremia (115), hypokalemia (2.4) and mild worsening of renal function after receiving metolazone for volume overload.   The thiazide was discontinued.  Her daughter has been keeping a tight monitor on her weight and blood pressure.  Her weight has been steady in the 145-147 range her blood pressures typically 140s/70s, although today it is 102/70.  She received a dose of morphine not long before this appointment peer  She continues to have problems with orthopnea and lower extremity edema, unfortunately.  She is not able to lie flat for even a second or 2.  As always, her daughter helps a lot with her review of systems, since Meredith Gutierrez is having worsening short-term memory loss.  It does not appear to Meredith Gutierrez is having problems with angina, palpitations, dizziness or syncope.  Her daughter reports that she eats and drinks constantly. Her blood sugar has been persistently elevated (180-220 fasting).   Her weight has increased from 139 in July, 141 in late August (probably her dry weight).  She weighed 139.7  pounds on admission in early November.  145 pounds today on our office scale.  She was previously diagnosed as having renal artery stenosis due to fibromuscular dysplasia, but the most recent duplex ultrasound studies performed in 2016 and 2018 did not show meaningful stenosis.  Past Medical History:  Diagnosis Date  . Angina    never needed to take nitroglycerin.  . Anxiety   . Blood transfusion without reported diagnosis   . Cerebral atherosclerosis   . COPD (chronic obstructive pulmonary disease) ([place])    02-16-13-"pt. denies this"-shows on CXR of 2- 2013.  Marland Kitchen Coronary artery disease 2003   s/p CABG 2014  . Diabetes mellitus without complication ([place]) 52/7782   NEW ONSET  . Diastolic CHF, on admit treated. 08/10/2011  . Family history of adverse reaction to anesthesia    SISTER GETS SICK  . Fibromuscular dysplasia of renal artery ([facility])    right  . Heart murmur   . History of kidney stones 02-16-13   "hx. of overgrowth of muscle-causing some stricture"renal artery stenosis-being followed [place]  . Hyperlipidemia   . Hypertension   . Hyperthyroidism    [place]  . Hyponatremia 04/2018  . Melanoma ([place]) 2010   skin cancers, 1 melanoma removed back  . Osteoarthritis     Past Surgical History:  Procedure Laterality Date  . ABDOMINAL HYSTERECTOMY  1970   heavy bleeding  . APPENDECTOMY  1960s  . CARDIAC CATHETERIZATION  08/09/2011   no sign. coronary obstruction. LIMA is atretic w/excellent flow down the native LAD  . CARDIAC  CATHETERIZATION  08/22/2001   no sign. coronary stenosis,  . CATARACT EXTRACTION, BILATERAL Bilateral   . CESAREAN SECTION    . CORONARY ARTERY BYPASS GRAFT  02-16-13   3'03-no bypasses -Had 2 aneurysm repairs(stents used)  . LAPAROTOMY N/A 02/20/2013   Procedure: EXPLORATORY LAPAROTOMY;  [name], MD  . LEFT HEART CATHETERIZATION WITH CORONARY ANGIOGRAM N/A 08/09/2011   Procedure: LEFT HEART CATHETERIZATION WITH CORONARY ANGIOGRAM;  Surgeon: Sanda Klein,  MD;  Location: [facility] CATH [facility];  Service: Cardiovascular;  Laterality: N/A;  . NM MYOCAR PERF WALL MOTION  07/20/2010   normal  . SALPINGOOPHORECTOMY Bilateral 02/20/2013   benign seromucinous cystadenofibroma R ovary [name], MD)  . TOOTH EXTRACTION    . TOTAL HIP ARTHROPLASTY Left 2015  . UPPER GASTROINTESTINAL ENDOSCOPY      Current Medications: Outpatient Medications Prior to Visit  Medication Sig Dispense Refill  . ACCU-CHEK FASTCLIX LANCETS MISC 1 each by Does not apply route daily. Use as directed to check blood sugar once daily.  Dx code:  E11.65 100 each 0  . albuterol (PROVENTIL) (2.5 MG/3ML) 0.083% nebulizer solution Take 3 mLs (2.5 mg total) by nebulization every 6 (six) hours as needed for wheezing or shortness of breath. 75 mL 1  . Alcohol Swabs (B-D SINGLE USE SWABS REGULAR) PADS 1 each by Does not apply route daily. Use as directed to check blood sugar once daily.  Dx code:  E11.65 100 each 0  . aspirin EC 81 MG tablet Take 81 mg by mouth daily.    . Blood Glucose Monitoring Suppl (ACCU-CHEK AVIVA PLUS) w/Device KIT 1 each by Does not apply route daily. Use as directed to check blood sugar once daily.  Dx code:  E11.65 1 kit 0  . candesartan (ATACAND) 16 MG tablet Take 0.5 tablets (8 mg total) by mouth daily. 30 tablet 6  . carboxymethylcellulose (REFRESH PLUS) 0.5 % SOLN Place 1 drop into both eyes 2 (two) times daily.    . cetirizine (ZYRTEC) 10 MG tablet Take 10 mg by mouth daily.    . Cholecalciferol (VITAMIN D) 2000 units CAPS Take 1 capsule (2,000 Units total) by mouth daily. 30 capsule   . donepezil (ARICEPT) 5 MG tablet TAKE ONE TABLET BY MOUTH AT BEDTIME 30 tablet 3  . famotidine (PEPCID) 20 MG tablet Take 1 tablet (20 mg total) by mouth 2 (two) times daily. 60 tablet 3  . glucose blood (ACCU-CHEK AVIVA PLUS) test strip 1 each by Other route as needed for other. Use as instructed to check blood sugar once daily.  Dx code:  E11.65 100 each 0  . LORazepam (ATIVAN) 0.5  MG tablet Take 1 tablet (0.5 mg total) by mouth at bedtime as needed for anxiety or sleep. 30 tablet 1  . morphine (MS CONTIN) 15 MG 12 hr tablet Take 1 tablet by mouth 2 (two) times daily.    . potassium chloride SA (K-DUR,KLOR-CON) 20 MEQ tablet Take 2 tablets (40 mEq total) by mouth 2 (two) times daily. 16 tablet 0  . furosemide (LASIX) 40 MG tablet Take 2 tablets (80 mg total) by mouth daily. 60 tablet 1   Facility-Administered Medications Prior to Visit  Medication Dose Route Frequency Provider Last Rate Last Dose  . 0.9 %  sodium chloride infusion  500 mL Intravenous Once Armbruster, Carlota Raspberry, MD         Allergies:   Codeine; Penicillin g; Penicillins; Prednisolone acetate; Meloxicam; Nitroglycerin; Crestor [rosuvastatin calcium]; Lopressor [metoprolol tartrate]; Prednisone; Sulfamethoxazole;  and Zetia [ezetimibe]   Social History   Socioeconomic History  . Marital status: Widowed    Spouse name: Not on file  . Number of children: Not on file  . Years of education: Not on file  . Highest education level: Not on file  Occupational History  . Not on file  Social Needs  . Financial resource strain: Not on file  . Food insecurity:    Worry: Not on file    Inability: Not on file  . Transportation needs:    Medical: Not on file    Non-medical: Not on file  Tobacco Use  . Smoking status: Former Smoker    Packs/day: 0.10    Years: 20.00    Pack years: 2.00    Types: Cigarettes    Last attempt to quit: 09/14/2017    Years since quitting: 0.6  . Smokeless tobacco: Never Used  . Tobacco comment: smoking cessation info given  Substance and Sexual Activity  . Alcohol use: No  . Drug use: No  . Sexual activity: Never  Lifestyle  . Physical activity:    Days per week: Not on file    Minutes per session: Not on file  . Stress: Not on file  Relationships  . Social connections:    Talks on phone: Not on file    Gets together: Not on file    Attends religious service: Not on  file    Active member of club or organization: Not on file    Attends meetings of clubs or organizations: Not on file    Relationship status: Not on file  Other Topics Concern  . Not on file  Social History Narrative   Widow   Lives with daughter and daughter's GF   Edu: HS   Occ: retired, worked at [employer]   Activity: no regular exercise   Diet: good water      Family History:  The patient's family history includes Breast cancer in her maternal aunt; Colon polyps in her maternal grandfather; Coronary artery disease in her father; Dementia (age of onset: 58) in her mother; Diabetes in her brother; Hypertension in her brother and father; Hypotension in her mother and sister; Lung cancer in her daughter; Stroke in her mother; Thyroid cancer in her daughter.   ROS:   Please see the history of present illness.    ROS All other systems reviewed and are negative.   PHYSICAL EXAM:   VS:  BP 102/70   Pulse 76   Ht 5' (1.524 m)   Wt 145 lb (65.8 kg)   BMI 28.32 kg/m       General:  oriented x3, no distress, sleepy Head: no evidence of trauma, PERRL, EOMI, no exophtalmos or lid lag, no myxedema, no xanthelasma; normal ears, nose and oropharynx Neck: Roughly 8 cm elevation in jugular venous pulsations and prompt hepatojugular reflux; brisk carotid pulses without delay and no carotid bruits Chest: clear to auscultation, no signs of consolidation by percussion or palpation, normal fremitus, symmetrical and full respiratory excursions Cardiovascular: normal position and quality of the apical impulse, regular rhythm, normal first and second heart sounds, no murmurs, rubs or gallops Abdomen: no tenderness or distention, no masses by palpation, no abnormal pulsatility or arterial bruits, normal bowel sounds, no hepatosplenomegaly Extremities: no clubbing, cyanosis; there is symmetrical 2-3+ soft pitting edema almost to the knees bilaterally; 2+ radial, ulnar and brachial pulses bilaterally; 2+ right  femoral, posterior tibial and dorsalis pedis pulses; 2+ left femoral,  posterior tibial and dorsalis pedis pulses; no subclavian or femoral bruits Neurological: grossly nonfocal Psych: Normal mood and affect    Wt Readings from Last 3 Encounters:  05/08/18 145 lb (65.8 kg)  04/28/18 150 lb (68 kg)  04/24/18 150 lb (68 kg)      Studies/Labs Reviewed:   ECHO 09/01/2017: - Left ventricle: The cavity size was normal. There was mild   concentric hypertrophy. Systolic function was normal. The   estimated ejection fraction was in the range of 55% to 60%. Wall   motion was normal; there were no regional wall motion   abnormalities. Doppler parameters are consistent with abnormal   left ventricular relaxation (grade 1 diastolic dysfunction). - Aortic valve: Trileaflet; moderately thickened, severely   calcified leaflets. Transvalvular velocity was within the normal   range. There was no stenosis. E/e' was 14.68  EKG:  EKG is ordered today.  It shows normal sinus rhythm, left atrial abnormality, V2 and V3 appear to be reversed creating a false appearance of anterior infarction, no repolarization normalities, QTC 443 ms.   Recent Labs: 12/10/2017: B Natriuretic Peptide 76.8 04/18/2018: NT-Pro BNP 235 04/19/2018: ALT 22; Hemoglobin 12.4; Platelets 319; TSH 0.463 04/21/2018: Magnesium 2.2 04/24/2018: BUN 45; Creatinine, Ser 0.85; Potassium 3.8; Sodium 126   Lipid Panel    Component Value Date/Time   CHOL 184 04/07/2018 0841   TRIG 286.0 (H) 04/07/2018 0841   HDL 36.40 (L) 04/07/2018 0841   CHOLHDL 5 04/07/2018 0841   VLDL 57.2 (H) 04/07/2018 0841   LDLCALC 123 02/06/2016   LDLDIRECT 122.0 04/07/2018 0841    ASSESSMENT:    1. Acute on chronic diastolic heart failure ([place])   2. Hyponatremia   3. Essential hypertension   4. History of renal artery stenosis   5. Coronary artery disease involving native coronary artery of native heart without angina pectoris   6. Hypercholesterolemia    7. Diabetes mellitus type 2 in nonobese [facility])      PLAN:  In order of problems listed above:  1. CHF: She is still clinically hypervolemic, NYHA functional class III.  We will increase diuretics.  Target "dry weight" around 140 pounds.  I think that it is quite likely that the acute kidney injury, hypokalemia and hyponatremia were caused by excessively fast diuresis, rather than an inappropriate treatment with diuretics.  A very narrow range of balance between hypervolemia and hypovolemia.  It is possible that she has cardiac amyloidosis, but I think her neurological problems limit the benefit she would get from aggressive work-up and treatment for this disorder.  We will increase the diuretics to furosemide 80 mg twice daily with instructions to reduce back to 80 mg once daily if her weight reaches 140 pounds.  In parallel she will have to follow fluid restriction. 2. Hyponatremia: Recheck labs today.  Fluid restriction 1800 mL / 24 hours.  Recheck labs 1-2 weeks after the change in medications today.  She reportedly had recent labs with Dr. Michiel Sites and has euthyroid state.  TSH was borderline on November 6 when she was hospitalized (0.46). 3. HTN: Unusually low blood pressure today.  The patient's daughter says that her home monitor has been calibrated against an office monitor.  Typically at home her blood pressure is in the 140s/70s.. Note history of intolerance to beta blockers.  We can reduce the dose of candesartan if necessary. 4. History of left renal artery stenosis: No stenosis over 60% on studies from 2016 and 2018 5. CAD: She does  not have angina pectoris, most recent cardiac catheterization with low risk anatomy. 6. HLP: Intolerant to statins despite several tries.  Red yeast rice did not make a difference.  Because of her cognitive deficits and impaired quality of life I do not think PCSK9 inhibitors will make a big difference. 7. DM: Hyperglycemia is contributing to the problems with  volume maintenance/hyponatremia via obligatory diuresis.  Medication Adjustments/Labs and Tests Ordered: Current medicines are reviewed at length with the patient today.  Concerns regarding medicines are outlined above.  Medication changes, Labs and Tests ordered today are listed in the Patient Instructions below. Patient Instructions  Medication Instructions:  Dr Sallyanne Kuster has recommended making the following medication changes: 1. TAKE FUROSEMIDE BASED ON YOUR WEIGHT If weight is 140 pounds or more take 80 mg TWICE daily If weight is 140 pounds or less take 80 ONCE daily  Your physician recommends that you weigh, daily, at the same time every day, and in the same amount of clothing. Please record your daily weights on the handout provided and bring it to your next appointment.  If you need a refill on your cardiac medications before your next appointment, please call your pharmacy.   Lab work: Your physician recommends that you return for lab work TODAY.  If you have labs (blood work) drawn today and your tests are completely normal, you will receive your results only by: Marland Kitchen MyChart Message (if you have MyChart) OR . A paper copy in the mail If you have any lab test that is abnormal or we need to change your treatment, we will call you to review the results.  Follow-Up:  Your physician recommends that you schedule a follow-up appointment in 3 weeks with a NP/PA.  At [facility], you and your health needs are our priority.  As part of our continuing mission to provide you with exceptional heart care, we have created designated Provider Care Teams.  These Care Teams include your primary Cardiologist (physician) and Advanced Practice Providers (APPs -  Physician Assistants and Nurse Practitioners) who all work together to provide you with the care you need, when you need it. You will need a follow up appointment in 3 months.  Please call our office 2 months in advance to schedule this  appointment.  You may see Sanda Klein, MD or one of the following Advanced Practice Providers on your designated Care Team: [place], [place] . Fabian Sharp, PA-C  Any Other Special Instructions Will Be Listed Below (If Applicable).     Limit fluid intake to 1800 mL per day    Signed, Sanda Klein, MD  05/08/2018 1:24 PM    [facility] [place], [place], [place]  [address] Phone: [phone]; Fax: [phone]

## 2018-05-08 NOTE — Patient Instructions (Addendum)
Medication Instructions:  Dr Sallyanne Kuster has recommended making the following medication changes: 1. TAKE FUROSEMIDE BASED ON YOUR WEIGHT If weight is 140 pounds or more take 80 mg TWICE daily If weight is 140 pounds or less take 80 ONCE daily  Your physician recommends that you weigh, daily, at the same time every day, and in the same amount of clothing. Please record your daily weights on the handout provided and bring it to your next appointment.  If you need a refill on your cardiac medications before your next appointment, please call your pharmacy.   Lab work: Your physician recommends that you return for lab work TODAY.  If you have labs (blood work) drawn today and your tests are completely normal, you will receive your results only by: Marland Kitchen MyChart Message (if you have MyChart) OR . A paper copy in the mail If you have any lab test that is abnormal or we need to change your treatment, we will call you to review the results.  Follow-Up:  Your physician recommends that you schedule a follow-up appointment in 3 weeks with a NP/PA.  At [facility], you and your health needs are our priority.  As part of our continuing mission to provide you with exceptional heart care, we have created designated Provider Care Teams.  These Care Teams include your primary Cardiologist (physician) and Advanced Practice Providers (APPs -  Physician Assistants and Nurse Practitioners) who all work together to provide you with the care you need, when you need it. You will need a follow up appointment in 3 months.  Please call our office 2 months in advance to schedule this appointment.  You may see Sanda Klein, MD or one of the following Advanced Practice Providers on your designated Care Team: [place], [place] . Fabian Sharp, PA-C  Any Other Special Instructions Will Be Listed Below (If Applicable).     Limit fluid intake to 1800 mL per day

## 2018-05-08 NOTE — Telephone Encounter (Signed)
New Message:    Patient daughter calling she's having some concerns about how much medication that her mother should be taken. The name of the medication is Furosemide 80 mg but she is confused about how much to take.

## 2018-05-08 NOTE — Telephone Encounter (Signed)
Pt's daughter aware if pt weighs 140 or greater then needs to take Furosemide 80 mg twice daily and if less than 140 should take Furosemide 80 mg daily .Daughter verbalized understanding ./cy

## 2018-05-09 ENCOUNTER — Telehealth: Payer: Self-pay | Admitting: Cardiovascular Disease

## 2018-05-09 LAB — BASIC METABOLIC PANEL
BUN/Creatinine Ratio: 30 — ABNORMAL HIGH (ref 12–28)
BUN: 22 mg/dL (ref 8–27)
CO2: 27 mmol/L (ref 20–29)
CREATININE: 0.74 mg/dL (ref 0.57–1.00)
Calcium: 9.9 mg/dL (ref 8.7–10.3)
Chloride: 93 mmol/L — ABNORMAL LOW (ref 96–106)
GFR calc Af Amer: 89 mL/min/{1.73_m2} (ref >59)
GFR calc non Af Amer: 77 mL/min/{1.73_m2} (ref >59)
Glucose: 145 mg/dL — ABNORMAL HIGH (ref 65–99)
Potassium: 4.8 mmol/L (ref 3.5–5.2)
SODIUM: 136 mmol/L (ref 134–144)

## 2018-05-09 LAB — MAGNESIUM: Magnesium: 1.9 mg/dL (ref 1.6–2.3)

## 2018-05-09 NOTE — Telephone Encounter (Signed)
New message   Patient's daughter is returning phone call for lab results.

## 2018-05-09 NOTE — Telephone Encounter (Signed)
Pt given lab results 

## 2018-05-23 ENCOUNTER — Other Ambulatory Visit: Payer: Self-pay | Admitting: Family Medicine

## 2018-05-23 DIAGNOSIS — R918 Other nonspecific abnormal finding of lung field: Secondary | ICD-10-CM

## 2018-05-26 ENCOUNTER — Telehealth: Payer: Self-pay | Admitting: Cardiovascular Disease

## 2018-05-26 NOTE — Telephone Encounter (Signed)
Go ahead and give a one time order for metolazone 2.5 mg to take tomorrow morning, 30 minutes before the furosemide.  MCr

## 2018-05-26 NOTE — Telephone Encounter (Signed)
New Message:     Daughter says pt weight have been running  from 139 to 144 for the last 20 days. [name] question is, should pt decrease her Lasix?

## 2018-05-26 NOTE — Telephone Encounter (Signed)
Returned call to pt's daughter she states that she was wanting to know if pt should still be taking 160mg  lasix daily. She states that pt has not been able to stay at dry weight for a whole day and wt has been ranging 140-144. Pt has not been able to lie flat. Denies any other SOB. Creatine is at normal range on last draw. Ankles and abdomen are still swollen. She does have an appt with Hao 06-01-18. She will have pt continue to take lasix 160mg  AM QD until f/u appt.

## 2018-05-26 NOTE — Telephone Encounter (Signed)
RETURNED [place] MD DIRECTION. VERBALIZED UNDERSTANDING. SHE WILL CB OR GO TO THE ER IF PT WORSENS. SHE STATES THAT SHE HAS METOLAZONE 2.5 MG ON HAND SO NO NEW RX IS REQUIRED, VERBALIZES TO TAKE ONLY 1 TIME NOT TO ADD ON DAILY. SHE WILL CB W/RESULTS Monday.

## 2018-06-01 ENCOUNTER — Encounter: Payer: Self-pay | Admitting: Family Medicine

## 2018-06-01 ENCOUNTER — Ambulatory Visit: Payer: Medicare HMO | Admitting: Physician Assistant

## 2018-06-01 ENCOUNTER — Encounter: Payer: Self-pay | Admitting: Physician Assistant

## 2018-06-01 ENCOUNTER — Ambulatory Visit: Payer: Medicare HMO | Admitting: Gastroenterology

## 2018-06-01 ENCOUNTER — Encounter: Payer: Self-pay | Admitting: Gastroenterology

## 2018-06-01 ENCOUNTER — Ambulatory Visit (INDEPENDENT_AMBULATORY_CARE_PROVIDER_SITE_OTHER): Payer: Medicare HMO | Admitting: Family Medicine

## 2018-06-01 VITALS — BP 104/50 | HR 85 | Ht 61.0 in | Wt 142.3 lb

## 2018-06-01 VITALS — BP 112/62 | HR 76 | Temp 97.6°F | Ht 60.0 in | Wt 142.3 lb

## 2018-06-01 VITALS — BP 108/60 | HR 70 | Ht 61.0 in | Wt 142.0 lb

## 2018-06-01 DIAGNOSIS — F0390 Unspecified dementia without behavioral disturbance: Secondary | ICD-10-CM

## 2018-06-01 DIAGNOSIS — I25810 Atherosclerosis of coronary artery bypass graft(s) without angina pectoris: Secondary | ICD-10-CM

## 2018-06-01 DIAGNOSIS — R1310 Dysphagia, unspecified: Secondary | ICD-10-CM

## 2018-06-01 DIAGNOSIS — E119 Type 2 diabetes mellitus without complications: Secondary | ICD-10-CM | POA: Diagnosis not present

## 2018-06-01 DIAGNOSIS — K219 Gastro-esophageal reflux disease without esophagitis: Secondary | ICD-10-CM

## 2018-06-01 DIAGNOSIS — I5033 Acute on chronic diastolic (congestive) heart failure: Secondary | ICD-10-CM | POA: Diagnosis not present

## 2018-06-01 DIAGNOSIS — E860 Dehydration: Secondary | ICD-10-CM | POA: Diagnosis not present

## 2018-06-01 DIAGNOSIS — IMO0001 Reserved for inherently not codable concepts without codable children: Secondary | ICD-10-CM

## 2018-06-01 DIAGNOSIS — R1319 Other dysphagia: Secondary | ICD-10-CM

## 2018-06-01 DIAGNOSIS — I5032 Chronic diastolic (congestive) heart failure: Secondary | ICD-10-CM

## 2018-06-01 DIAGNOSIS — Z87891 Personal history of nicotine dependence: Secondary | ICD-10-CM | POA: Diagnosis not present

## 2018-06-01 DIAGNOSIS — E039 Hypothyroidism, unspecified: Secondary | ICD-10-CM

## 2018-06-01 DIAGNOSIS — I10 Essential (primary) hypertension: Secondary | ICD-10-CM | POA: Diagnosis not present

## 2018-06-01 DIAGNOSIS — F0150 Vascular dementia without behavioral disturbance: Secondary | ICD-10-CM | POA: Diagnosis not present

## 2018-06-01 DIAGNOSIS — E785 Hyperlipidemia, unspecified: Secondary | ICD-10-CM

## 2018-06-01 DIAGNOSIS — E1165 Type 2 diabetes mellitus with hyperglycemia: Secondary | ICD-10-CM | POA: Diagnosis not present

## 2018-06-01 DIAGNOSIS — J449 Chronic obstructive pulmonary disease, unspecified: Secondary | ICD-10-CM | POA: Diagnosis not present

## 2018-06-01 DIAGNOSIS — K229 Disease of esophagus, unspecified: Secondary | ICD-10-CM | POA: Diagnosis not present

## 2018-06-01 LAB — POCT GLUCOSE (DEVICE FOR HOME USE): POC Glucose: 224 mg/dl — AB (ref 70–99)

## 2018-06-01 NOTE — Progress Notes (Signed)
BP 112/62 (BP Location: Right Arm, Patient Position: Sitting, Cuff Size: Normal)   Pulse 76   Temp 97.6 F (36.4 C) (Oral)   Ht 5' (1.524 m)   Wt 142 lb 5 oz (64.6 kg)   BMI 27.79 kg/m    CC: 6 mo f/u visit Subjective:    Patient ID: Meredith Gutierrez, female    DOB: Sep 02, 1940, 77 y.o.   MRN: [id number]  HPI: Meredith Gutierrez is a 77 y.o. female presenting on 06/01/2018 for Follow-up (Here for 6 mo f/u. Pt accompanied by her daughter, [name]. )   Saw GI Dr Havery Moros today, note reviewed. Improved dysphagia s/p dilation and on pepcid.  ESOPHAGOGASTRODUODENOSCOPY 04/2018 - reflux esophagitis, s/p dilation, benign schwann cell hamartoma ([place])  Saw cardiology Dr C 04/2018, has f/u planned later today, note reviewed as well. Target dry weight around 140 lbs. Potassium was stopped.   Recent hospitalization for severe hyponatremia with confusion, found to have diabetes A1c 8.4%. ongoing trouble with appetite - trouble tracking meals. Worsening dementia noted by daughter - increasing confusion, sedation, sundowning.  Lab Results  Component Value Date   HGBA1C 8.4 (H) 04/20/2018     Sees Dr Chalmers Cater endo for h/o hyperthyroidism.  Lab Results  Component Value Date   TSH 0.463 04/19/2018    Relevant past medical, surgical, family and social history reviewed and updated as indicated. Interim medical history since our last visit reviewed. Allergies and medications reviewed and updated. Outpatient Medications Prior to Visit  Medication Sig Dispense Refill  . ACCU-CHEK FASTCLIX LANCETS MISC 1 each by Does not apply route daily. Use as directed to check blood sugar once daily.  Dx code:  E11.65 100 each 0  . albuterol (PROVENTIL) (2.5 MG/3ML) 0.083% nebulizer solution Take 3 mLs (2.5 mg total) by nebulization every 6 (six) hours as needed for wheezing or shortness of breath. 75 mL 1  . Alcohol Swabs (B-D SINGLE USE SWABS REGULAR) PADS 1 each by Does not apply route daily. Use as directed  to check blood sugar once daily.  Dx code:  E11.65 100 each 0  . aspirin EC 81 MG tablet Take 81 mg by mouth daily.    . Blood Glucose Monitoring Suppl (ACCU-CHEK AVIVA PLUS) w/Device KIT 1 each by Does not apply route daily. Use as directed to check blood sugar once daily.  Dx code:  E11.65 1 kit 0  . candesartan (ATACAND) 16 MG tablet Take 0.5 tablets (8 mg total) by mouth daily. 30 tablet 6  . carboxymethylcellulose (REFRESH PLUS) 0.5 % SOLN Place 1 drop into both eyes 2 (two) times daily.    . cetirizine (ZYRTEC) 10 MG tablet Take 10 mg by mouth daily.    . Cholecalciferol (VITAMIN D) 2000 units CAPS Take 1 capsule (2,000 Units total) by mouth daily. 30 capsule   . donepezil (ARICEPT) 5 MG tablet TAKE ONE TABLET BY MOUTH AT BEDTIME 30 tablet 3  . famotidine (PEPCID) 20 MG tablet Take 1 tablet (20 mg total) by mouth 2 (two) times daily. 60 tablet 3  . furosemide (LASIX) 80 MG tablet Take 1-2 tablets (80-160 mg total) by mouth daily as directed per physician. 180 tablet 3  . glucose blood (ACCU-CHEK AVIVA PLUS) test strip 1 each by Other route as needed for other. Use as instructed to check blood sugar once daily.  Dx code:  E11.65 100 each 0  . LORazepam (ATIVAN) 0.5 MG tablet Take 1 tablet (0.5 mg total) by mouth at  bedtime as needed for anxiety or sleep. 30 tablet 1  . morphine (MS CONTIN) 15 MG 12 hr tablet Take 1 tablet by mouth 2 (two) times daily.    . potassium chloride SA (K-DUR,KLOR-CON) 20 MEQ tablet Take 2 tablets (40 mEq total) by mouth 2 (two) times daily. 16 tablet 0   No facility-administered medications prior to visit.      Per HPI unless specifically indicated in ROS section below Review of Systems     Objective:    BP 112/62 (BP Location: Right Arm, Patient Position: Sitting, Cuff Size: Normal)   Pulse 76   Temp 97.6 F (36.4 C) (Oral)   Ht 5' (1.524 m)   Wt 142 lb 5 oz (64.6 kg)   BMI 27.79 kg/m   Wt Readings from Last 3 Encounters:  06/01/18 142 lb 5 oz (64.6  kg)  06/01/18 142 lb (64.4 kg)  05/08/18 145 lb (65.8 kg)    Physical Exam Vitals signs and nursing note reviewed.  Constitutional:      Appearance: Normal appearance.  Cardiovascular:     Rate and Rhythm: Normal rate and regular rhythm.     Pulses: Normal pulses.     Heart sounds: No murmur.  Pulmonary:     Effort: Pulmonary effort is normal. No respiratory distress.     Breath sounds: Normal breath sounds. No wheezing, rhonchi or rales.  Musculoskeletal:     Right lower leg: No edema.     Left lower leg: No edema.  Neurological:     Mental Status: She is alert.    Results for orders placed or performed in visit on 06/01/18  POCT Glucose (Device for Home Use)  Result Value Ref Range   Glucose Fasting, POC     POC Glucose 224 (A) 70 - 99 mg/dl      Assessment & Plan:   Problem List Items Addressed This Visit    Uncontrolled diabetes mellitus type 2 without complications ([place])    Too soon for A1c. I wanted fructosamine today but pt likely to have blood drawn at cardiology office today - so will hold on second blood draw today and just check sugar control next visit. cbg high today 224. Continue low sugar low carb diet as up to now.       Relevant Orders   POCT Glucose (Device for Home Use) (Completed)   Ex-smoker    Fully quit smoking 12/2017, remains abstinent.       Esophageal dysphagia    S/p dilation and doing well. Appreciate GI care.       Dementia ([place]) - Primary    Presumed vascular dementia. Unsure if she's taking aricept. rec increase to 1m daily if tolerated. If any trouble, would likely add on namenda. Discussed night time symptoms, recommended minimizing benzo use (this was started during hospitalization).           No orders of the defined types were placed in this encounter.  Orders Placed This Encounter  Procedures  . POCT Glucose (Device for Home Use)    Follow up plan: Return in about 3 months (around 08/31/2018), or if symptoms worsen or  fail to improve, for follow up visit.  JRia Bush MD

## 2018-06-01 NOTE — Progress Notes (Signed)
Cardiology Office Note    Date:  06/01/2018   ID:  Meredith, Gutierrez 11-Oct-1940, MRN [id number]  PCP:  Ria Bush, MD  Cardiologist: Dr. Sallyanne Kuster  Chief Complaint  Patient presents with  . Follow-up    seen for Dr. Sallyanne Kuster    History of Present Illness:  Meredith Gutierrez is a 77 y.o. female with PMH of CAD s/p CABG 2003 (LIMA-LAD, SVG-D1, ligation o large LAD aneurysm), DM 2, COPD, hyperlipidemia, hypertension, hypothyroidism and osteoarthritis.  Previous cardiac cath was in 2013 showed atretic LIMA with patent LAD.  She also has a history of renal artery stenosis related to fibromuscular dysplasia.  Most recent renal duplex in 2018 was negative for significant restenosis.  She is intolerant to numerous statins such as Crestor and Zocor and Zetia.  She is not on beta-blocker due to baseline bradycardia.  She had a history of severe dehydration with hyponatremia and hypokalemia after receiving metolazone for volume overload.  Thiazide was discontinued.  Her daughter has been monitoring her weight and blood pressure.   Patient was mostly seen by Dr. Sallyanne Kuster on 05/08/2018, she was advised to take 80 mg daily of Lasix as long as her weight is less than 140 pounds however take 80 mg twice daily if her weight is above 140 pounds.  Her dry weight was found to be around 140 pounds.  She presents today for follow-up.  She called our office last Friday due to concern of persistent weight gain, and was instructed to take one-time dosing of metolazone 2.5 mg.  Unfortunately, she misunderstood instruction and has been taking metolazone prior to the Lasix on a daily basis for the past 6 days.  Based on her weight diuresis, her weight today is 141 pounds.  And her blood pressure has been dropping.  This morning, her blood pressure was in the high 90s.  Her BP was 104/50 in the office today.  Despite her distal ankle edema, I think she is severely dehydrated.  I think she will always have some degree  of distal ankle edema no matter her volume status.  This is further evidenced by the borderline low BP this morning.  I instructed the patient to stop the metolazone for the time being, I asked her to hold the Lasix for the next 3 days and to restart Lasix next Monday.  I will see her back next Tuesday for further assessment.  She will continue to keep a weight diary during the meantime.  Also note, she is not on 80 mg twice daily of Lasix, she has been taking 160 mg daily of Lasix instead as she did not wish to go to the bathroom too often.  Depend on the renal function, I may ended up splitting the Lasix to 80 mg twice daily instead.  I will obtain a stat basic metabolic panel today as I am concerned of recurrent hyponatremia.  If her sodium level become severely low, she may need to be admitted to the hospital again.    Past Medical History:  Diagnosis Date  . Angina    never needed to take nitroglycerin.  . Anxiety   . Blood transfusion without reported diagnosis   . Cerebral atherosclerosis   . COPD (chronic obstructive pulmonary disease) ([place])    02-16-13-"pt. denies this"-shows on CXR of 2- 2013.  [facility] Coronary artery disease 2003   s/p CABG 2014  . Diabetes mellitus without complication ([place]) 24/0973   NEW ONSET  . Diastolic CHF,  on admit treated. 08/10/2011  . Family history of adverse reaction to anesthesia    SISTER GETS SICK  . Fibromuscular dysplasia of renal artery ([facility])    right  . Heart murmur   . History of kidney stones 02-16-13   "hx. of overgrowth of muscle-causing some stricture"renal artery stenosis-being followed [facility]  . Hyperlipidemia   . Hypertension   . Hyperthyroidism    [place]  . Hyponatremia 04/2018  . Melanoma ([place]) 2010   skin cancers, 1 melanoma removed back  . Osteoarthritis     Past Surgical History:  Procedure Laterality Date  . ABDOMINAL HYSTERECTOMY  1970   heavy bleeding  . APPENDECTOMY  1960s  . CARDIAC CATHETERIZATION  08/09/2011   no sign.  coronary obstruction. LIMA is atretic w/excellent flow down the native LAD  . CARDIAC CATHETERIZATION  08/22/2001   no sign. coronary stenosis,  . CATARACT EXTRACTION, BILATERAL Bilateral   . CESAREAN SECTION    . CORONARY ARTERY BYPASS GRAFT  02-16-13   3'03-no bypasses -Had 2 aneurysm repairs(stents used)  . ESOPHAGOGASTRODUODENOSCOPY  04/2018   LA Grade A esophagitis, small nodule distal esophagus (benign schwann cell hamartoma), empiric dilation to 79m ([place])  . LAPAROTOMY N/A 02/20/2013   Procedure: EXPLORATORY LAPAROTOMY;  WJanie Morning MD  . LEFT HEART CATHETERIZATION WITH CORONARY ANGIOGRAM N/A 08/09/2011   Procedure: LEFT HEART CATHETERIZATION WITH CORONARY ANGIOGRAM;  Surgeon: MSanda Klein MD;  [facility];  Service: Cardiovascular;  Laterality: N/A;  . NM MYOCAR PERF WALL MOTION  07/20/2010   normal  . SALPINGOOPHORECTOMY Bilateral 02/20/2013   benign seromucinous cystadenofibroma R ovary ([name] MD)  . TOOTH EXTRACTION    . TOTAL HIP ARTHROPLASTY Left 2015    Current Medications: Outpatient Medications Prior to Visit  Medication Sig Dispense Refill  . ACCU-CHEK FASTCLIX LANCETS MISC 1 each by Does not apply route daily. Use as directed to check blood sugar once daily.  Dx code:  E11.65 100 each 0  . albuterol (PROVENTIL) (2.5 MG/3ML) 0.083% nebulizer solution Take 3 mLs (2.5 mg total) by nebulization every 6 (six) hours as needed for wheezing or shortness of breath. 75 mL 1  . Alcohol Swabs (B-D SINGLE USE SWABS REGULAR) PADS 1 each by Does not apply route daily. Use as directed to check blood sugar once daily.  Dx code:  E11.65 100 each 0  . aspirin EC 81 MG tablet Take 81 mg by mouth daily.    . Blood Glucose Monitoring Suppl (ACCU-CHEK AVIVA PLUS) w/Device KIT 1 each by Does not apply route daily. Use as directed to check blood sugar once daily.  Dx code:  E11.65 1 kit 0  . candesartan (ATACAND) 16 MG tablet Take 0.5 tablets (8 mg total) by mouth  daily. 30 tablet 6  . carboxymethylcellulose (REFRESH PLUS) 0.5 % SOLN Place 1 drop into both eyes 2 (two) times daily.    . cetirizine (ZYRTEC) 10 MG tablet Take 10 mg by mouth daily.    . Cholecalciferol (VITAMIN D) 2000 units CAPS Take 1 capsule (2,000 Units total) by mouth daily. 30 capsule   . donepezil (ARICEPT) 5 MG tablet TAKE ONE TABLET BY MOUTH AT BEDTIME 30 tablet 3  . famotidine (PEPCID) 20 MG tablet Take 1 tablet (20 mg total) by mouth 2 (two) times daily. 60 tablet 3  . furosemide (LASIX) 80 MG tablet Take 1-2 tablets (80-160 mg total) by mouth daily as directed per physician. 180 tablet 3  . glucose blood (ACCU-CHEK  AVIVA PLUS) test strip 1 each by Other route as needed for other. Use as instructed to check blood sugar once daily.  Dx code:  E11.65 100 each 0  . LORazepam (ATIVAN) 0.5 MG tablet Take 1 tablet (0.5 mg total) by mouth at bedtime as needed for anxiety or sleep. 30 tablet 1  . morphine (MS CONTIN) 15 MG 12 hr tablet Take 1 tablet by mouth 2 (two) times daily.    . potassium chloride SA (K-DUR,KLOR-CON) 20 MEQ tablet Take 2 tablets (40 mEq total) by mouth 2 (two) times daily. 16 tablet 0   No facility-administered medications prior to visit.      Allergies:   Codeine; Penicillin g; Penicillins; Prednisolone acetate; Meloxicam; Nitroglycerin; Crestor [rosuvastatin calcium]; Lopressor [metoprolol tartrate]; Prednisone; Sulfamethoxazole; and Zetia [ezetimibe]   Social History   Socioeconomic History  . Marital status: Widowed    Spouse name: Not on file  . Number of children: Not on file  . Years of education: Not on file  . Highest education level: Not on file  Occupational History  . Not on file  Social Needs  . Financial resource strain: Not on file  . Food insecurity:    Worry: Not on file    Inability: Not on file  . Transportation needs:    Medical: Not on file    Non-medical: Not on file  Tobacco Use  . Smoking status: Former Smoker    Packs/day:  0.10    Years: 20.00    Pack years: 2.00    Types: Cigarettes    Last attempt to quit: 09/14/2017    Years since quitting: 0.7  . Smokeless tobacco: Never Used  . Tobacco comment: smoking cessation info given  Substance and Sexual Activity  . Alcohol use: No  . Drug use: No  . Sexual activity: Never  Lifestyle  . Physical activity:    Days per week: Not on file    Minutes per session: Not on file  . Stress: Not on file  Relationships  . Social connections:    Talks on phone: Not on file    Gets together: Not on file    Attends religious service: Not on file    Active member of club or organization: Not on file    Attends meetings of clubs or organizations: Not on file    Relationship status: Not on file  Other Topics Concern  . Not on file  Social History Narrative   Widow   Lives with daughter and daughter's GF   Edu: HS   Occ: retired, worked at [employer]   Activity: no regular exercise   Diet: good water      Family History:  The patient's family history includes Breast cancer in her maternal aunt; Colon polyps in her maternal grandfather; Coronary artery disease in her father; Dementia (age of onset: 39) in her mother; Diabetes in her brother; Hypertension in her brother and father; Hypotension in her mother and sister; Lung cancer in her daughter; Stroke in her mother; Thyroid cancer in her daughter.   ROS:   Please see the history of present illness.    ROS All other systems reviewed and are negative.   PHYSICAL EXAM:   VS:  BP (!) 104/50   Pulse 85   Ht 5' 1" (1.549 m)   Wt 142 lb 4.8 oz (64.5 kg)   BMI 26.89 kg/m    GEN: Well nourished, well developed, in no acute distress  HEENT: normal  Neck: no JVD, carotid bruits, or masses Cardiac: RRR; no murmurs, rubs, or gallops. Distal ankle edema  Respiratory:  clear to auscultation bilaterally, normal work of breathing GI: soft, nontender, nondistended, + BS MS: no deformity or atrophy  Skin: warm and dry, no  rash Neuro:  Alert and Oriented x 3, Strength and sensation are intact Psych: euthymic mood, full affect  Wt Readings from Last 3 Encounters:  06/01/18 142 lb 4.8 oz (64.5 kg)  06/01/18 142 lb 5 oz (64.6 kg)  06/01/18 142 lb (64.4 kg)      Studies/Labs Reviewed:   EKG:  EKG is not ordered today.   Recent Labs: 12/10/2017: B Natriuretic Peptide 76.8 04/18/2018: NT-Pro BNP 235 04/19/2018: ALT 22; Hemoglobin 12.4; Platelets 319; TSH 0.463 05/08/2018: BUN 22; Creatinine, Ser 0.74; Magnesium 1.9; Potassium 4.8; Sodium 136   Lipid Panel    Component Value Date/Time   CHOL 184 04/07/2018 0841   TRIG 286.0 (H) 04/07/2018 0841   HDL 36.40 (L) 04/07/2018 0841   CHOLHDL 5 04/07/2018 0841   VLDL 57.2 (H) 04/07/2018 0841   LDLCALC 123 02/06/2016   LDLDIRECT 122.0 04/07/2018 0841    Additional studies/ records that were reviewed today include:   Echo 09/01/2017 LV EF: 55% -   60% Study Conclusions  - Left ventricle: The cavity size was normal. There was mild   concentric hypertrophy. Systolic function was normal. The   estimated ejection fraction was in the range of 55% to 60%. Wall   motion was normal; there were no regional wall motion   abnormalities. Doppler parameters are consistent with abnormal   left ventricular relaxation (grade 1 diastolic dysfunction). - Aortic valve: Trileaflet; moderately thickened, severely   calcified leaflets. Transvalvular velocity was within the normal   range. There was no stenosis.   ASSESSMENT:    1. Dehydration   2. Chronic diastolic heart failure ([place])   3. Coronary artery disease involving coronary bypass graft of native heart without angina pectoris   4. Hyperlipidemia LDL goal <70   5. Chronic obstructive pulmonary disease, unspecified COPD type ([place])   6. Controlled type 2 diabetes mellitus without complication, without long-term current use of insulin ([place])   7. Hypothyroidism, unspecified type   8. Essential hypertension       PLAN:  In order of problems listed above:  1. Dehydration: She was started on 80 mg twice daily of Lasix during the last office visit, instead, she has been taking 160 mg daily of Lasix due to concern of going to the bathroom in the afternoon.  Her daughter called our office last Friday due to concern of persistent weight gain, she was instructed to take 1 time dose of metolazone.  Instead her daughter misunderstood and has been giving her metolazone on a daily basis for the past 6 days.  Her blood pressure this morning was borderline low, despite the fact she does not have significant symptoms, she has been fatigued recently.  The daughter is contributing in this to sundowning and the fact that she only slept for 30 minutes last night.  She keep dozing off in the office today, I am concerned that her symptoms may be more related to recurrent hyponatremia.  I will obtain a stat basic metabolic panel.  If hyponatremia is severe, we may ended up needing to send her to the emergency room again.  -I discussed the case with her daughter today, I suspect her dry weight is actually closer to 142 pounds.  2.  Chronic diastolic heart failure: Unfortunately her narrow therapeutic window is problematic.  Given the fact that she appears to be dehydrated on today's exam, I did tell her to hold her Lasix for 3 days before restarting next Monday.  I will see her next Tuesday for follow-up.  She will hold metolazone until instructed otherwise as well  3. Hypertension: She was actually hypotensive with a systolic blood pressure in the 90s this morning.  Her blood pressure is better in the office  4. Hyperlipidemia: She is intolerant to multiple statins including Zetia.  She was felt not to be a candidate for PCSK9 inhibitor as well.  5. DM2: Managed by primary care provider  6. Hypothyroidism: Followed by primary care provider.    Medication Adjustments/Labs and Tests Ordered: Current medicines are reviewed  at length with the patient today.  Concerns regarding medicines are outlined above.  Medication changes, Labs and Tests ordered today are listed in the Patient Instructions below. Patient Instructions  Medication Instructions:  Almyra Deforest, PA has recommended making the following medication changes: 1. HOLD Metolazone - take it ONLY AS INSTRUCTED 2. HOLD Furosemide for 3 days - RESTART on Monday  If you need a refill on your cardiac medications before your next appointment, please call your pharmacy.   Lab work: Your physician recommends that you return for lab work TODAY.  If you have labs (blood work) drawn today and your tests are completely normal, you will receive your results only by: Marland Kitchen MyChart Message (if you have MyChart) OR . A paper copy in the mail If you have any lab test that is abnormal or we need to change your treatment, we will call you to review the results.  Follow-Up: Isaac Laud recommends that you schedule a follow-up appointment in 5 days - Tuesday, December 24th, 2019 at 10:00 am.    Hilbert Corrigan, [place]  06/01/2018 2:50 PM    [address], [place], [place]  [address] Phone: [phone]; Fax: [phone]

## 2018-06-01 NOTE — Assessment & Plan Note (Addendum)
Too soon for A1c. I wanted fructosamine today but pt likely to have blood drawn at cardiology office today - so will hold on second blood draw today and just check sugar control next visit. cbg high today 224. Continue low sugar low carb diet as up to now.

## 2018-06-01 NOTE — Assessment & Plan Note (Signed)
Fully quit smoking 12/2017, remains abstinent.

## 2018-06-01 NOTE — Progress Notes (Signed)
HPI :  77 year old female here for follow-up visit. She is accompanied by her daughter today as the patient has dysphagia. She's been seen here in the past for dysphagia. Initial evaluation with barium swallow with tablet suggested dysmotility with frequent tertiary contractions/spasms and stasis.  Tablet passed easily. She followed up for her ongoing symptoms of dysphagia, she was referred for EGD which was performed in November. He was noted to have mild esophagitis, a small nodule in the distal esophagus, she was empirically dilated to 18 mm. I then placed her on Pepcid for esophagitis.  Since the procedure she's been doing much better. She has been able to eat her meals without much of any dysphagia. She has some occasional dysphagia to pills but daughter reports overall she's had a nice response. There are no complaints today.  Pathology on the esophageal biopsy returned as benign schwann cell hamartoma. We discussed what this was as below.   EGD 04/28/2018 - LA Grade A esophagitis, small nodule distal esophagus, empiric dilation to 57m  Past Medical History:  Diagnosis Date  . Angina    never needed to take nitroglycerin.  . Anxiety   . Blood transfusion without reported diagnosis   . Cerebral atherosclerosis   . COPD (chronic obstructive pulmonary disease) ([facility]    02-16-13-"pt. denies this"-shows on CXR of 2- 2013.  .Marland KitchenCoronary artery disease 2003   s/p CABG 2014  . Diabetes mellitus without complication ([facility] 135/4656  [place]  . Diastolic CHF, on admit treated. 08/10/2011  . Family history of adverse reaction to anesthesia    SISTER GETS SICK  . Fibromuscular dysplasia of renal artery (HCC)    right  . Heart murmur   . History of kidney stones 02-16-13   "hx. of overgrowth of muscle-causing some stricture"renal artery stenosis-being followed [facility] . Hyperlipidemia   . Hypertension   . Hyperthyroidism    [place]  . Hyponatremia 04/2018  . Melanoma ([facility] [place]   skin  cancers, 1 melanoma removed back  . Osteoarthritis      Past Surgical History:  Procedure Laterality Date  . ABDOMINAL HYSTERECTOMY  1970   heavy bleeding  . APPENDECTOMY  1960s  . CARDIAC CATHETERIZATION  08/09/2011   no sign. coronary obstruction. LIMA is atretic w/excellent flow down the native LAD  . CARDIAC CATHETERIZATION  08/22/2001   no sign. coronary stenosis,  . CATARACT EXTRACTION, BILATERAL Bilateral   . CESAREAN SECTION    . CORONARY ARTERY BYPASS GRAFT  02-16-13   3'03-no bypasses -Had 2 aneurysm repairs(stents used)  . LAPAROTOMY N/A 02/20/2013   Procedure: EXPLORATORY LAPAROTOMY;  WJanie Morning MD  . LEFT HEART CATHETERIZATION WITH CORONARY ANGIOGRAM N/A 08/09/2011   Procedure: LEFT HEART CATHETERIZATION WITH CORONARY ANGIOGRAM;  Surgeon: MSanda Klein MD;  [facility];  Service: Cardiovascular;  Laterality: N/A;  . NM MYOCAR PERF WALL MOTION  07/20/2010   normal  . SALPINGOOPHORECTOMY Bilateral 02/20/2013   benign seromucinous cystadenofibroma R ovary ([name] MD)  . TOOTH EXTRACTION    . TOTAL HIP ARTHROPLASTY Left 2015  . UPPER GASTROINTESTINAL ENDOSCOPY     Family History  Problem Relation Age of Onset  . Stroke Mother   . Hypotension Mother   . Dementia Mother 615 . Coronary artery disease Father   . Hypertension Father   . Breast cancer Maternal Aunt   . Hypotension Sister   . Hypertension Brother   . Diabetes Brother   . Lung cancer Daughter   .  Thyroid cancer Daughter   . Colon polyps Maternal Grandfather   . Colon cancer Neg Hx   . Esophageal cancer Neg Hx   . Stomach cancer Neg Hx   . Rectal cancer Neg Hx    Social History   Tobacco Use  . Smoking status: Former Smoker    Packs/day: 0.10    Years: 20.00    Pack years: 2.00    Types: Cigarettes    Last attempt to quit: 09/14/2017    Years since quitting: 0.7  . Smokeless tobacco: Never Used  . Tobacco comment: smoking cessation info given  Substance Use Topics  . Alcohol  use: No  . Drug use: No   Current Outpatient Medications  Medication Sig Dispense Refill  . ACCU-CHEK FASTCLIX LANCETS MISC 1 each by Does not apply route daily. Use as directed to check blood sugar once daily.  Dx code:  E11.65 100 each 0  . albuterol (PROVENTIL) (2.5 MG/3ML) 0.083% nebulizer solution Take 3 mLs (2.5 mg total) by nebulization every 6 (six) hours as needed for wheezing or shortness of breath. 75 mL 1  . Alcohol Swabs (B-D SINGLE USE SWABS REGULAR) PADS 1 each by Does not apply route daily. Use as directed to check blood sugar once daily.  Dx code:  E11.65 100 each 0  . aspirin EC 81 MG tablet Take 81 mg by mouth daily.    . Blood Glucose Monitoring Suppl (ACCU-CHEK AVIVA PLUS) w/Device KIT 1 each by Does not apply route daily. Use as directed to check blood sugar once daily.  Dx code:  E11.65 1 kit 0  . candesartan (ATACAND) 16 MG tablet Take 0.5 tablets (8 mg total) by mouth daily. 30 tablet 6  . carboxymethylcellulose (REFRESH PLUS) 0.5 % SOLN Place 1 drop into both eyes 2 (two) times daily.    . cetirizine (ZYRTEC) 10 MG tablet Take 10 mg by mouth daily.    . Cholecalciferol (VITAMIN D) 2000 units CAPS Take 1 capsule (2,000 Units total) by mouth daily. 30 capsule   . donepezil (ARICEPT) 5 MG tablet TAKE ONE TABLET BY MOUTH AT BEDTIME 30 tablet 3  . famotidine (PEPCID) 20 MG tablet Take 1 tablet (20 mg total) by mouth 2 (two) times daily. 60 tablet 3  . furosemide (LASIX) 80 MG tablet Take 1-2 tablets (80-160 mg total) by mouth daily as directed per physician. 180 tablet 3  . glucose blood (ACCU-CHEK AVIVA PLUS) test strip 1 each by Other route as needed for other. Use as instructed to check blood sugar once daily.  Dx code:  E11.65 100 each 0  . LORazepam (ATIVAN) 0.5 MG tablet Take 1 tablet (0.5 mg total) by mouth at bedtime as needed for anxiety or sleep. 30 tablet 1  . morphine (MS CONTIN) 15 MG 12 hr tablet Take 1 tablet by mouth 2 (two) times daily.    . potassium  chloride SA (K-DUR,KLOR-CON) 20 MEQ tablet Take 2 tablets (40 mEq total) by mouth 2 (two) times daily. 16 tablet 0   No current facility-administered medications for this visit.    Allergies  Allergen Reactions  . Codeine Anaphylaxis and Shortness Of Breath    Shortness of breath  . Penicillin G Shortness Of Breath and Rash  . Penicillins Anaphylaxis and Shortness Of Breath    Has patient had a PCN reaction causing immediate rash, facial/tongue/throat swelling, SOB or lightheadedness with hypotension: YES Has patient had a PCN reaction causing severe rash involving mucus membranes or  skin necrosis: NO Has patient had a PCN reaction that required hospitalization: NO Has patient had a PCN reaction occurring within the last 10 years: NO If all of the above answers are "NO", then may proceed with Cephalosporin use.  . Prednisolone Acetate Nausea Only and Shortness Of Breath  . Meloxicam Hives  . Nitroglycerin   . Crestor [Rosuvastatin Calcium] Other (See Comments)    Weakness with statins  . Lopressor [Metoprolol Tartrate] Other (See Comments)    Bradycardia   . Prednisone Hives and Swelling    Shortness of breath  . Sulfamethoxazole Hives and Swelling  . Zetia [Ezetimibe] Other (See Comments)    Weakness      Review of Systems: All systems reviewed and negative except where noted in HPI.   Lab Results  Component Value Date   WBC 10.6 (H) 04/19/2018   HGB 12.4 04/19/2018   HCT 35.3 (L) 04/19/2018   MCV 80.2 04/19/2018   PLT 319 04/19/2018    Lab Results  Component Value Date   CREATININE 0.74 05/08/2018   BUN 22 05/08/2018   NA 136 05/08/2018   K 4.8 05/08/2018   CL 93 (L) 05/08/2018   CO2 27 05/08/2018    Lab Results  Component Value Date   ALT 22 04/19/2018   AST 31 04/19/2018   ALKPHOS 97 04/19/2018   BILITOT 0.6 04/19/2018     Physical Exam: BP 108/60   Pulse 70   Ht _0  (1.549 m)   Wt 142 lb (64.4 kg)   BMI 26.83 kg/m  Constitutional: Pleasant,   female in no acute distress. HEENT: Normocephalic and atraumatic. Conjunctivae are normal. No scleral icterus. Neck supple.  Cardiovascular: Normal rate, regular rhythm.  Pulmonary/chest: Effort normal and breath sounds normal. No wheezing, rales or rhonchi. Abdominal: Soft, nondistended, nontender.  There are no masses palpable.  Extremities: no edema Lymphadenopathy: No cervical adenopathy noted. Neurological: Alert and oriented to person place and time. Skin: Skin is warm and dry. No rashes noted. Psychiatric: Normal mood and affect. Behavior is normal.   ASSESSMENT AND PLAN: 77 year old female here for reassessment of the following issues:  Dysphagia / GERD / benign Schwan cell hamartoma - I suspect this patient had a cricopharyngeal stenosis in the setting of nonspecific dysmotility. She had a very nice response to dilation recently and most of her dysphagia has resolved. She is also been treated for her esophagitis with Pepcid and not having any reflux symptoms. Overall improved. She incidentally was noted to have a small nodule a few millimeters in size at the distal esophagus. This returned as a benign Schwan cell hamartoma. I counseled him that this is a rare growth of the GI tract, often not seen in the esophagus. This is a benign lesion but does have potential to grow. Given her comorbidities and age I don't feel strongly that this will ever pose a problem to her given its small size, and likely does not warrant surveillance, but I counseled themm that if her dysphagia recurs would have low threshold to repeat endoscopy to ensure this is stable and to perform another empiric dilation. I otherwise recommend a a follow up in one year. Daughter agreed with the plan.  Gaylord Cellar, MD [facility]

## 2018-06-01 NOTE — Progress Notes (Signed)
I just checked for labs - not back yet

## 2018-06-01 NOTE — Assessment & Plan Note (Signed)
S/p dilation and doing well. Appreciate GI care.

## 2018-06-01 NOTE — Patient Instructions (Signed)
Medication Instructions:  Almyra Deforest, PA has recommended making the following medication changes: 1. HOLD Metolazone - take it ONLY AS INSTRUCTED 2. HOLD Furosemide for 3 days - RESTART on Monday  If you need a refill on your cardiac medications before your next appointment, please call your pharmacy.   Lab work: Your physician recommends that you return for lab work TODAY.  If you have labs (blood work) drawn today and your tests are completely normal, you will receive your results only by: Marland Kitchen MyChart Message (if you have MyChart) OR . A paper copy in the mail If you have any lab test that is abnormal or we need to change your treatment, we will call you to review the results.  Follow-Up: Isaac Laud recommends that you schedule a follow-up appointment in 5 days - Tuesday, December 24th, 2019 at 10:00 am.

## 2018-06-01 NOTE — Assessment & Plan Note (Addendum)
Presumed vascular dementia. Unsure if she's taking aricept. rec increase to 10mg  daily if tolerated. If any trouble, would likely add on namenda. Discussed night time symptoms, recommended minimizing benzo use (this was started during hospitalization).

## 2018-06-01 NOTE — Patient Instructions (Addendum)
Minimize lorazepam use. Check on home on aricept, should consider increasing aricept to 10mg  daily if tolerated. If not, let me know and we would start namenda for memory.  Continue low sugar diet. We will check sugar levels next visit.  Return in 3 months for follow up visit.  cbg today.

## 2018-06-02 ENCOUNTER — Telehealth: Payer: Self-pay

## 2018-06-02 LAB — BASIC METABOLIC PANEL
BUN / CREAT RATIO: 52 — AB (ref 12–28)
BUN: 79 mg/dL (ref 8–27)
CHLORIDE: 81 mmol/L — AB (ref 96–106)
CO2: 25 mmol/L (ref 20–29)
Calcium: 9 mg/dL (ref 8.7–10.3)
Creatinine, Ser: 1.51 mg/dL — ABNORMAL HIGH (ref 0.57–1.00)
GFR calc Af Amer: 38 mL/min/{1.73_m2} — ABNORMAL LOW (ref >59)
GFR calc non Af Amer: 33 mL/min/{1.73_m2} — ABNORMAL LOW (ref >59)
GLUCOSE: 252 mg/dL — AB (ref 65–99)
POTASSIUM: 2.8 mmol/L — AB (ref 3.5–5.2)
Sodium: 126 mmol/L — ABNORMAL LOW (ref 134–144)

## 2018-06-02 MED ORDER — POTASSIUM CHLORIDE CRYS ER 20 MEQ PO TBCR: 40.0000 meq | EXTENDED_RELEASE_TABLET | Freq: Two times a day (BID) | ORAL | 3 refills | Status: DC

## 2018-06-02 NOTE — Progress Notes (Signed)
Potassium very low, sodium level also low but not critical, worsened renal function consistent with dehydration. My recommendation to stop metalazone until notified otherwise and hold lasix for 3 days before restarting next Monday is unchanged. She will continue to take her potassium supplement for today and tomorrow, hold on Sunday and restart on Monday. She is scheduled for BMET on Monday and followup with me that same day

## 2018-06-02 NOTE — Telephone Encounter (Signed)
-----   Message from [place], [place] sent at 06/02/2018  8:13 AM EST ----- Potassium very low, sodium level also low but not critical, worsened renal function consistent with dehydration. My recommendation to stop metalazone until notified otherwise and hold lasix for 3 days before restarting next Monday is unchanged. She w ill continue to take her potassium supplement for today and tomorrow, hold on Sunday and restart on Monday. She is scheduled for BMET on Monday and followup with me that same day

## 2018-06-02 NOTE — Telephone Encounter (Signed)
Daughter updated to continue with fluid restriction of 60 oz per [facility], [place]. Verbalized understanding.

## 2018-06-02 NOTE — Telephone Encounter (Signed)
Pt's daughter updated with lab result along with Almyra Deforest, PA's recommendation. Daughter read back instructions and verbalized understanding. New prescription for potassium sent to pharmacy.  Daughter states they currently have her mother on a 60 oz fluid restriction, and was inquiring if they need to continue. Will route to PA.

## 2018-06-05 ENCOUNTER — Ambulatory Visit: Payer: Medicare HMO | Admitting: Physician Assistant

## 2018-06-05 ENCOUNTER — Encounter: Payer: Self-pay | Admitting: Physician Assistant

## 2018-06-05 VITALS — BP 118/58 | HR 80 | Ht 62.0 in | Wt 140.4 lb

## 2018-06-05 DIAGNOSIS — E039 Hypothyroidism, unspecified: Secondary | ICD-10-CM

## 2018-06-05 DIAGNOSIS — I5032 Chronic diastolic (congestive) heart failure: Secondary | ICD-10-CM

## 2018-06-05 DIAGNOSIS — E785 Hyperlipidemia, unspecified: Secondary | ICD-10-CM | POA: Diagnosis not present

## 2018-06-05 DIAGNOSIS — E119 Type 2 diabetes mellitus without complications: Secondary | ICD-10-CM | POA: Diagnosis not present

## 2018-06-05 DIAGNOSIS — I25810 Atherosclerosis of coronary artery bypass graft(s) without angina pectoris: Secondary | ICD-10-CM | POA: Diagnosis not present

## 2018-06-05 DIAGNOSIS — L0231 Cutaneous abscess of buttock: Secondary | ICD-10-CM | POA: Diagnosis not present

## 2018-06-05 DIAGNOSIS — I10 Essential (primary) hypertension: Secondary | ICD-10-CM | POA: Diagnosis not present

## 2018-06-05 MED ORDER — POTASSIUM CHLORIDE CRYS ER 20 MEQ PO TBCR: 40.0000 meq | EXTENDED_RELEASE_TABLET | Freq: Every day | ORAL | 1 refills | Status: DC

## 2018-06-05 MED ORDER — FUROSEMIDE 80 MG PO TABS: ORAL_TABLET | ORAL | 1 refills | Status: DC

## 2018-06-05 MED ORDER — DOXYCYCLINE HYCLATE 100 MG PO TABS: 100.0000 mg | ORAL_TABLET | Freq: Two times a day (BID) | ORAL | 0 refills | Status: DC

## 2018-06-05 NOTE — Progress Notes (Signed)
Cardiology Office Note    Date:  06/07/2018   ID:  Naly, Schwanz 02/09/1941, MRN [id number]  PCP:  Ria Bush, MD  Cardiologist:  Dr. Sallyanne Kuster   Chief Complaint  Patient presents with  . Follow-up    seen for Dr. Sallyanne Kuster.     History of Present Illness:  Meredith Gutierrez is a 77 y.o. female with PMH of CAD s/p CABG 2003 (LIMA-LAD, SVG-D1, ligation of large LAD aneurysm), DM 2, COPD, hyperlipidemia, hypertension, hypothyroidism and osteoarthritis.  Previous cardiac cath was in 2013 showed atretic LIMA with patent LAD.  She also has a history of renal artery stenosis related to fibromuscular dysplasia.  Most recent renal duplex in 2018 was negative for significant restenosis.  She is intolerant to numerous statins such as Crestor and Zocor and Zetia.  She is not on beta-blocker due to baseline bradycardia.  She had a history of severe dehydration with hyponatremia and hypokalemia after receiving metolazone for volume overload.  Thiazide was discontinued.  Her daughter has been monitoring her weight and blood pressure.   Patient was mostly seen by Dr. Sallyanne Kuster on 05/08/2018, she was advised to take 80 mg daily of Lasix as long as her weight is less than 140 pounds however take 80 mg twice daily if her weight is above 140 pounds.  Her dry weight was found to be around 140 pounds.  She called our office last Friday due to concern of persistent weight gain, and was instructed to take one-time dosing of metolazone 2.5 mg.  Unfortunately, she misunderstood instruction and has been taking metolazone prior to the Lasix on a daily basis for the past 6 days.  I saw the patient on 06/01/2018, her blood pressure was low and that she had signs of dehydration.  I held her Lasix for 3 days and then stop her metolazone.  Lab work did confirm my suspicion with potassium of 2.8, sodium of 126, creatinine of 1.51.  Patient returned today for reassessment.   Patient presents today for cardiology  office visit.  Although her weight continues to trend down, she is actually feeling better.  I will obtain a basic metabolic panel today.  She has been restarted on the Lasix, I will restart her on 80 mg daily of Lasix.  His daughter who manages medication is wondering if he she can do a trial on lower dose of diuretic.  I am fine with this on the 80 mg daily of Lasix instead of 80 mg twice daily.  I will reduce her potassium to 40 mEq daily as well.  She is accompanied by her son today, her son is aware that if her weight increased by more than 3 pounds overnight or 5 pounds in single day, her daughter who lives with her will need to contact us and then let us know.  If that is the case, I have very low threshold to increase her Lasix to 80 mg twice daily and potassium to 40 mill equivalent twice daily.  I will see her back in 1 to 2 weeks.  Furthermore, I examined her right buttock with chaperone today, there is a single area of drainage with a large indurated area measuring 6 cm x 4 cm, this is likely an abscess.  She has significant drug allergy to avoid aspirin and a sulfa antibiotic, I have discussed with our clinical pharmacist, I will start her on doxycycline 100 mg twice daily for 7 days.  Family is aware that she  will need to be evaluated by her primary care provider to follow-up on the effect of the antibiotic.  Due to see encapsulation of abscess, there is a moderate probability that the antibiotic may not be able to get into the abscess, if that is the case, patient will need incision and drainage.    Past Medical History:  Diagnosis Date  . Angina    never needed to take nitroglycerin.  . Anxiety   . Blood transfusion without reported diagnosis   . Cerebral atherosclerosis   . COPD (chronic obstructive pulmonary disease) ([place])    02-16-13-"pt. denies this"-shows on CXR of 2- 2013.  Marland Kitchen Coronary artery disease 2003   s/p CABG 2014  . Diabetes mellitus without complication ([place]) 06/6008   [place]  . Diastolic CHF, on admit treated. 08/10/2011  . Family history of adverse reaction to anesthesia    SISTER GETS SICK  . Fibromuscular dysplasia of renal artery ([facility])    right  . Heart murmur   . History of kidney stones 02-16-13   "hx. of overgrowth of muscle-causing some stricture"renal artery stenosis-being followed [place]  . Hyperlipidemia   . Hypertension   . Hyperthyroidism    [place]  . Hyponatremia 04/2018  . Melanoma ([place]) 2010   skin cancers, 1 melanoma removed back  . Osteoarthritis     Past Surgical History:  Procedure Laterality Date  . ABDOMINAL HYSTERECTOMY  1970   heavy bleeding  . APPENDECTOMY  1960s  . CARDIAC CATHETERIZATION  08/09/2011   no sign. coronary obstruction. LIMA is atretic w/excellent flow down the native LAD  . CARDIAC CATHETERIZATION  08/22/2001   no sign. coronary stenosis,  . CATARACT EXTRACTION, BILATERAL Bilateral   . CESAREAN SECTION    . CORONARY ARTERY BYPASS GRAFT  02-16-13   3'03-no bypasses -Had 2 aneurysm repairs(stents used)  . ESOPHAGOGASTRODUODENOSCOPY  04/2018   LA Grade A esophagitis, small nodule distal esophagus (benign schwann cell hamartoma), empiric dilation to 85m (Armbruster)  . LAPAROTOMY N/A 02/20/2013   Procedure: EXPLORATORY LAPAROTOMY;  WJanie Morning MD  . LEFT HEART CATHETERIZATION WITH CORONARY ANGIOGRAM N/A 08/09/2011   Procedure: LEFT HEART CATHETERIZATION WITH CORONARY ANGIOGRAM;  Surgeon: MSanda Klein MD;  [facility];  Service: Cardiovascular;  Laterality: N/A;  . NM MYOCAR PERF WALL MOTION  07/20/2010   normal  . SALPINGOOPHORECTOMY Bilateral 02/20/2013   benign seromucinous cystadenofibroma R ovary ([name] MD)  . TOOTH EXTRACTION    . TOTAL HIP ARTHROPLASTY Left 2015    Current Medications: Outpatient Medications Prior to Visit  Medication Sig Dispense Refill  . ACCU-CHEK FASTCLIX LANCETS MISC 1 each by Does not apply route daily. Use as directed to check blood sugar once daily.  Dx  code:  E11.65 100 each 0  . albuterol (PROVENTIL) (2.5 MG/3ML) 0.083% nebulizer solution Take 3 mLs (2.5 mg total) by nebulization every 6 (six) hours as needed for wheezing or shortness of breath. 75 mL 1  . Alcohol Swabs (B-D SINGLE USE SWABS REGULAR) PADS 1 each by Does not apply route daily. Use as directed to check blood sugar once daily.  Dx code:  E11.65 100 each 0  . aspirin EC 81 MG tablet Take 81 mg by mouth daily.    . Blood Glucose Monitoring Suppl (ACCU-CHEK AVIVA PLUS) w/Device KIT 1 each by Does not apply route daily. Use as directed to check blood sugar once daily.  Dx code:  E11.65 1 kit 0  . candesartan (ATACAND) 16  MG tablet Take 0.5 tablets (8 mg total) by mouth daily. 30 tablet 6  . carboxymethylcellulose (REFRESH PLUS) 0.5 % SOLN Place 1 drop into both eyes 2 (two) times daily.    . cetirizine (ZYRTEC) 10 MG tablet Take 10 mg by mouth daily.    . Cholecalciferol (VITAMIN D) 2000 units CAPS Take 1 capsule (2,000 Units total) by mouth daily. 30 capsule   . donepezil (ARICEPT) 5 MG tablet TAKE ONE TABLET BY MOUTH AT BEDTIME 30 tablet 3  . famotidine (PEPCID) 20 MG tablet Take 1 tablet (20 mg total) by mouth 2 (two) times daily. 60 tablet 3  . glucose blood (ACCU-CHEK AVIVA PLUS) test strip 1 each by Other route as needed for other. Use as instructed to check blood sugar once daily.  Dx code:  E11.65 100 each 0  . LORazepam (ATIVAN) 0.5 MG tablet Take 1 tablet (0.5 mg total) by mouth at bedtime as needed for anxiety or sleep. 30 tablet 1  . morphine (MS CONTIN) 15 MG 12 hr tablet Take 1 tablet by mouth 2 (two) times daily.    . furosemide (LASIX) 80 MG tablet Take 1-2 tablets (80-160 mg total) by mouth daily as directed per physician. 180 tablet 3  . potassium chloride SA (K-DUR,KLOR-CON) 20 MEQ tablet Take 2 tablets (40 mEq total) by mouth 2 (two) times daily. 120 tablet 3   No facility-administered medications prior to visit.      Allergies:   Codeine; Penicillin g;  Penicillins; Prednisolone acetate; Meloxicam; Nitroglycerin; Crestor [rosuvastatin calcium]; Lopressor [metoprolol tartrate]; Prednisone; Sulfamethoxazole; and Zetia [ezetimibe]   Social History   Socioeconomic History  . Marital status: Widowed    Spouse name: Not on file  . Number of children: Not on file  . Years of education: Not on file  . Highest education level: Not on file  Occupational History  . Not on file  Social Needs  . Financial resource strain: Not on file  . Food insecurity:    Worry: Not on file    Inability: Not on file  . Transportation needs:    Medical: Not on file    Non-medical: Not on file  Tobacco Use  . Smoking status: Former Smoker    Packs/day: 0.10    Years: 20.00    Pack years: 2.00    Types: Cigarettes    Last attempt to quit: 09/14/2017    Years since quitting: 0.7  . Smokeless tobacco: Never Used  . Tobacco comment: smoking cessation info given  Substance and Sexual Activity  . Alcohol use: No  . Drug use: No  . Sexual activity: Never  Lifestyle  . Physical activity:    Days per week: Not on file    Minutes per session: Not on file  . Stress: Not on file  Relationships  . Social connections:    Talks on phone: Not on file    Gets together: Not on file    Attends religious service: Not on file    Active member of club or organization: Not on file    Attends meetings of clubs or organizations: Not on file    Relationship status: Not on file  Other Topics Concern  . Not on file  Social History Narrative   Widow   Lives with daughter and daughter's GF   Edu: HS   Occ: retired, worked at [employer]   Activity: no regular exercise   Diet: good water      Family History:  The patient's family history includes Breast cancer in her maternal aunt; Colon polyps in her maternal grandfather; Coronary artery disease in her father; Dementia (age of onset: 32) in her mother; Diabetes in her brother; Hypertension in her brother and father;  Hypotension in her mother and sister; Lung cancer in her daughter; Stroke in her mother; Thyroid cancer in her daughter.   ROS:   Please see the history of present illness.    ROS All other systems reviewed and are negative.   PHYSICAL EXAM:   VS:  BP (!) 118/58   Pulse 80   Ht 5' 2"  (1.575 m)   Wt 140 lb 6.4 oz (63.7 kg)   SpO2 99%   BMI 25.68 kg/m    GEN: Well nourished, well developed, in no acute distress  HEENT: normal  Neck: no JVD, carotid bruits, or masses Cardiac: RRR; no murmurs, rubs, or gallops,no edema  Respiratory:  clear to auscultation bilaterally, normal work of breathing GI: soft, nontender, nondistended, + BS MS: no deformity or atrophy  Skin: warm and dry, no rash. R buttock indurated area with drainage Neuro:  Alert and Oriented x 3, Strength and sensation are intact Psych: euthymic mood, full affect  Wt Readings from Last 3 Encounters:  06/05/18 140 lb 6.4 oz (63.7 kg)  06/01/18 142 lb 4.8 oz (64.5 kg)  06/01/18 142 lb 5 oz (64.6 kg)      Studies/Labs Reviewed:   EKG:  EKG is not ordered today.  Recent Labs: 12/10/2017: B Natriuretic Peptide 76.8 04/18/2018: NT-Pro BNP 235 04/19/2018: ALT 22; Hemoglobin 12.4; Platelets 319; TSH 0.463 05/08/2018: Magnesium 1.9 06/05/2018: BUN 35; Creatinine, Ser 0.88; Potassium 4.7; Sodium 135   Lipid Panel    Component Value Date/Time   CHOL 184 04/07/2018 0841   TRIG 286.0 (H) 04/07/2018 0841   HDL 36.40 (L) 04/07/2018 0841   CHOLHDL 5 04/07/2018 0841   VLDL 57.2 (H) 04/07/2018 0841   LDLCALC 123 02/06/2016   LDLDIRECT 122.0 04/07/2018 0841    Additional studies/ records that were reviewed today include:   Echo 09/01/2017 LV EF: 55% -   60% Study Conclusions  - Left ventricle: The cavity size was normal. There was mild   concentric hypertrophy. Systolic function was normal. The   estimated ejection fraction was in the range of 55% to 60%. Wall   motion was normal; there were no regional wall motion    abnormalities. Doppler parameters are consistent with abnormal   left ventricular relaxation (grade 1 diastolic dysfunction). - Aortic valve: Trileaflet; moderately thickened, severely   calcified leaflets. Transvalvular velocity was within the normal   range. There was no stenosis.     ASSESSMENT:    1. Chronic diastolic heart failure ([place])   2. Coronary artery disease involving coronary bypass graft of native heart without angina pectoris   3. Controlled type 2 diabetes mellitus without complication, without long-term current use of insulin ([place])   4. Hyperlipidemia LDL goal <70   5. Essential hypertension   6. Hypothyroidism, unspecified type   7. Abscess of right buttock      PLAN:  In order of problems listed above:  1. Chronic diastolic heart failure: She appears to be euvolemic, recent lab work shows dehydration, I stopped her Lasix for 3 days and will restart Lasix today.  I originally plan to restart her on 80 mg twice daily of Lasix, however her family is wondering if she can be on slightly lower dose.  I am willing  to do a trial on the 80 mg daily of Lasix.  However her family will need to contact us if her weight increased by more than 3 pounds overnight or 5 pounds in single week.  2. Right buttock abscess: I examined her right buttock with a chaperone after her son mentioned a possible infection.  It appears to be a large abscess.  She is intolerant of multiple antibiotic, after discussing with our clinical pharmacist, I decided to treat her with doxycycline for 7 days.  She is aware that she will need to be seen by her PCP within the next 7 days to make sure that her abscess is not getting larger.  Due to the encapsulated nature of abscess, if it is not responding to antibiotic treatment, then it may need incision and drainage  3. CAD s/p CABG: Denies any recent chest discomfort  4. Hypertension: Blood pressure stable on current therapy  5. Hyperlipidemia: Patient is  not on any statin at this time.  This will need to be readdressed on follow-up.  6. DM2: Managed by primary care provider  7. Hypothyroidism: Managed by primary care provider.    Medication Adjustments/Labs and Tests Ordered: Current medicines are reviewed at length with the patient today.  Concerns regarding medicines are outlined above.  Medication changes, Labs and Tests ordered today are listed in the Patient Instructions below. Patient Instructions  Medication Instructions:  Decrease Lasix to 80 mg daily Decrease Potassium to 40 mg daily Start Doxycycline 100 mg twice daily for 7 days.   If you need a refill on your cardiac medications before your next appointment, please call your pharmacy.   Lab work: BMET today  If you have labs (blood work) drawn today and your tests are completely normal, you will receive your results only by: Marland Kitchen MyChart Message (if you have MyChart) OR . A paper copy in the mail If you have any lab test that is abnormal or we need to change your treatment, we will call you to review the results.   Follow-Up: At [facility], you and your health needs are our priority.  As part of our continuing mission to provide you with exceptional heart care, we have created designated Provider Care Teams.  These Care Teams include your primary Cardiologist (physician) and Advanced Practice Providers (APPs -  Physician Assistants and Nurse Practitioners) who all work together to provide you with the care you need, when you need it. Marland Kitchen Keep follow up with Almyra Deforest, PA as scheduled . Make appointment with Primary Care within 1 week        Signed, Almyra Deforest, [place]  06/07/2018 11:53 PM    [address], [place], [place]  [address] Phone: [phone]; Fax: [phone]

## 2018-06-05 NOTE — Patient Instructions (Signed)
Medication Instructions:  Decrease Lasix to 80 mg daily Decrease Potassium to 40 mg daily Start Doxycycline 100 mg twice daily for 7 days.   If you need a refill on your cardiac medications before your next appointment, please call your pharmacy.   Lab work: BMET today  If you have labs (blood work) drawn today and your tests are completely normal, you will receive your results only by: Marland Kitchen MyChart Message (if you have MyChart) OR . A paper copy in the mail If you have any lab test that is abnormal or we need to change your treatment, we will call you to review the results.   Follow-Up: At [facility], you and your health needs are our priority.  As part of our continuing mission to provide you with exceptional heart care, we have created designated Provider Care Teams.  These Care Teams include your primary Cardiologist (physician) and Advanced Practice Providers (APPs -  Physician Assistants and Nurse Practitioners) who all work together to provide you with the care you need, when you need it. Marland Kitchen Keep follow up with Almyra Deforest, PA as scheduled . Make appointment with Primary Care within 1 week

## 2018-06-06 ENCOUNTER — Ambulatory Visit: Payer: Medicare HMO | Admitting: Physician Assistant

## 2018-06-06 LAB — BASIC METABOLIC PANEL
BUN/Creatinine Ratio: 40 — ABNORMAL HIGH (ref 12–28)
BUN: 35 mg/dL — AB (ref 8–27)
CALCIUM: 9.5 mg/dL (ref 8.7–10.3)
CO2: 25 mmol/L (ref 20–29)
Chloride: 91 mmol/L — ABNORMAL LOW (ref 96–106)
Creatinine, Ser: 0.88 mg/dL (ref 0.57–1.00)
GFR calc non Af Amer: 63 mL/min/{1.73_m2} (ref >59)
GFR, EST AFRICAN AMERICAN: 72 mL/min/{1.73_m2} (ref >59)
Glucose: 186 mg/dL — ABNORMAL HIGH (ref 65–99)
POTASSIUM: 4.7 mmol/L (ref 3.5–5.2)
Sodium: 135 mmol/L (ref 134–144)

## 2018-06-06 NOTE — Progress Notes (Signed)
Kidney function returned to normal, sodium level and potassium improved to normal range. Plan discussed during yesterday's office visit is unchanged.

## 2018-06-07 ENCOUNTER — Encounter: Payer: Self-pay | Admitting: Physician Assistant

## 2018-06-08 ENCOUNTER — Telehealth: Payer: Self-pay

## 2018-06-08 DIAGNOSIS — F0150 Vascular dementia without behavioral disturbance: Secondary | ICD-10-CM

## 2018-06-08 NOTE — Telephone Encounter (Signed)
Spoke with pt's daughter, [name] (on dpr).  Says pt is on lorazepam 0.5 mg but has not slept in 2 days. [name] is asking if she can give pt an additional 1/2 tab to help her sleep. States her anxiety is worse at night.  Also, [name] is asking if pt should be transferred to geriatric care. Says it has nothing to do with Dr. Carmela Hurt is just concerned since pt has so much going on. Please advise. [Gives permission to lvm.]

## 2018-06-08 NOTE — Telephone Encounter (Signed)
Pt's daughter called asking to speak to Lattie Haw about her Mom's anxiety and medication. Asked to call [phone]

## 2018-06-09 NOTE — Telephone Encounter (Signed)
Spoke with pt's daughter, [name], relaying Dr. Synthia Innocent message.  Verbalizes understanding.  States pt will need a refill for lorazepam since she will be taking extra and agrees to [facility] program.  Also, [name] confirms pt is taking Aricept.

## 2018-06-09 NOTE — Telephone Encounter (Signed)
Ok to try extra 1/2 tablet as needed for sleep. Is she taking aricept 10mg  daily? Could try to get her into [facility] program of they desire.

## 2018-06-12 MED ORDER — LORAZEPAM 0.5 MG PO TABS: 0.5000 mg | ORAL_TABLET | Freq: Every evening | ORAL | 1 refills | Status: DC | PRN

## 2018-06-12 NOTE — Progress Notes (Signed)
Well handled, Isaac Laud, Thank you MCr

## 2018-06-12 NOTE — Telephone Encounter (Signed)
Referral placed to [facility] program.

## 2018-06-20 ENCOUNTER — Telehealth: Payer: Self-pay | Admitting: *Deleted

## 2018-06-20 NOTE — Telephone Encounter (Signed)
Patient's daughter [name]) called stating that patient is on medication for anxiety and it does not seem to be helping. [name] stated that patient is not sleeping at night and only slept one hour last night. [name] stated that she feels that patient needs a sleep aid her something stronger. [name] is aware that Dr. Danise Mina is out of the office today. Pharmacy- [facility]

## 2018-06-21 MED ORDER — QUETIAPINE FUMARATE 25 MG PO TABS: 12.5000 mg | ORAL_TABLET | Freq: Every day | ORAL | 1 refills | Status: DC

## 2018-06-21 NOTE — Telephone Encounter (Signed)
Spoke with daughter [name]. Ongoing struggle at night despite 1.5 tab lorazepam. Melatonin hasn't helped.  Discussed namenda vs seroquel. Will return to lorazepam 1 tab nightly, add seroquel 12.5-25mg  nightly, update with effect in 1 wk. Daughter agrees with plan.

## 2018-06-22 ENCOUNTER — Ambulatory Visit: Payer: Medicare HMO | Admitting: Physician Assistant

## 2018-06-22 ENCOUNTER — Encounter: Payer: Self-pay | Admitting: Physician Assistant

## 2018-06-22 VITALS — BP 106/63 | HR 94 | Ht 62.0 in | Wt 139.4 lb

## 2018-06-22 DIAGNOSIS — E119 Type 2 diabetes mellitus without complications: Secondary | ICD-10-CM | POA: Diagnosis not present

## 2018-06-22 DIAGNOSIS — I10 Essential (primary) hypertension: Secondary | ICD-10-CM | POA: Diagnosis not present

## 2018-06-22 DIAGNOSIS — Z79899 Other long term (current) drug therapy: Secondary | ICD-10-CM | POA: Diagnosis not present

## 2018-06-22 DIAGNOSIS — I25810 Atherosclerosis of coronary artery bypass graft(s) without angina pectoris: Secondary | ICD-10-CM

## 2018-06-22 DIAGNOSIS — E039 Hypothyroidism, unspecified: Secondary | ICD-10-CM | POA: Diagnosis not present

## 2018-06-22 DIAGNOSIS — E785 Hyperlipidemia, unspecified: Secondary | ICD-10-CM | POA: Diagnosis not present

## 2018-06-22 NOTE — Patient Instructions (Addendum)
Medication Instructions:  Continue current medications  If you need a refill on your cardiac medications before your next appointment, please call your pharmacy.   Lab work: [employer] Today  If you have labs (blood work) drawn today and your tests are completely normal, you will receive your results only by: Marland Kitchen MyChart Message (if you have MyChart) OR . A paper copy in the mail If you have any lab test that is abnormal or we need to change your treatment, we will call you to review the results.  Testing/Procedures: None Ordered  Follow-Up: At [facility], you and your health needs are our priority.  As part of our continuing mission to provide you with exceptional heart care, we have created designated Provider Care Teams.  These Care Teams include your primary Cardiologist (physician) and Advanced Practice Providers (APPs -  Physician Assistants and Nurse Practitioners) who all work together to provide you with the care you need, when you need it. . Your physician recommends that you schedule a follow-up appointment in: Keep appointment with Dr Meredith Gutierrez on 03/06 @ 1:40 pm  Any Other Special Instructions Will Be Listed Below (If Applicable).

## 2018-06-22 NOTE — Progress Notes (Signed)
Cardiology Office Note    Date:  06/24/2018   ID:  Meredith Gutierrez April 17, 1941, MRN [id number]  PCP:  Ria Bush, MD  Cardiologist:  Dr. Sallyanne Kuster   Chief Complaint  Patient presents with  . Follow-up    pt denied chest pain    History of Present Illness:  Meredith Gutierrez is a 78 y.o. female with PMH of CAD s/p CABG 2003 (LIMA-LAD, SVG-D1, ligation of large LAD aneurysm),HTN, HLD, DM 2, COPD, hypothyroidism and osteoarthritis. Previous cardiac cath was in 2013 showed atretic LIMA with patent LAD. She also has a history of renal artery stenosis related to fibromuscular dysplasia. Most recent renal duplex in 2018 was negative for significant restenosis. She is intolerant to numerous statins such as Crestor, Zocor and Zetia. She is not on beta-blocker due to baseline bradycardia. She had a history of severe dehydration with hyponatremia and hypokalemia after receiving metolazone for volume overload. Thiazide was discontinued. Her daughter has been monitoring her weight and blood pressure.   Patient was mostly seen by Dr.Croitoruon 05/08/2018, she was advised to take 80 mg daily of Lasix as long as her weight is less than 140 pounds however take 80 mg twice daily if her weight is above 140 pounds. Her dry weight was found to be around 140 pounds.She called our office on 05/26/2018 due to concern of persistent weight gain, and was instructed to take one-time dosing of metolazone 2.5 mg. Unfortunately, she misunderstood instruction and took metolazone prior to the Lasix on a daily basis for 6 days.  I saw the patient on 06/01/2018, her blood pressure was low and she had signs of dehydration.  I held her Lasix for 3 days and then stop her metolazone.  Lab work did confirm my suspicion with potassium of 2.8, sodium of 126, creatinine of 1.51.    I saw the patient back on 06/05/2018, although her weight continue to trend down, she was feeling better at the time.  I restarted her  on 80 mg daily of Lasix instead of the previous 80 mg twice daily.  I also reduced her potassium to 40 mEq daily as well.  I instructed her son to give her additional dose of Lasix and potassium if her weight increased by more than 3 pounds overnight or 5 pounds in single week.  She also had a large indurated area on her right buttock measuring 4 x 6 cm which likely represent abscess, I gave her a course of antibiotic doxycycline 100 mg twice daily for 7 days.  Patient presents today along with her daughter, her right buttock abscess has completely resolved after a course of doxycycline.  Her weight continue to decrease on the 80 mg daily of Lasix.  I will obtain a basic metabolic panel.  I think her weight has been decreasing because she is not eating very well, her daughter did mention that her appetite has picked up in the past 2 days.  Hopefully we will see her weight began to bounce back.  Her daughter who managed her medication lives with her understand very well with that she may take extra dose of Lasix if her weight increased by more than 3 pounds overnight or 5 pounds over single week.  She seems to be taking very good care of the patient.   Past Medical History:  Diagnosis Date  . Angina    never needed to take nitroglycerin.  . Anxiety   . Blood transfusion without reported diagnosis   .  Cerebral atherosclerosis   . COPD (chronic obstructive pulmonary disease) ([place])    02-16-13-"pt. denies this"-shows on CXR of 2- 2013.  Marland Kitchen Coronary artery disease 2003   s/p CABG 2014  . Diabetes mellitus without complication ([place]) 38/2505   NEW ONSET  . Diastolic CHF, on admit treated. 08/10/2011  . Family history of adverse reaction to anesthesia    SISTER GETS SICK  . Fibromuscular dysplasia of renal artery ([facility])    right  . Heart murmur   . History of kidney stones 02-16-13   "hx. of overgrowth of muscle-causing some stricture"renal artery stenosis-being followed [facility]  . Hyperlipidemia   .  Hypertension   . Hyperthyroidism    [place]  . Hyponatremia 04/2018  . Melanoma ([place]) 2010   skin cancers, 1 melanoma removed back  . Osteoarthritis     Past Surgical History:  Procedure Laterality Date  . ABDOMINAL HYSTERECTOMY  1970   heavy bleeding  . APPENDECTOMY  1960s  . CARDIAC CATHETERIZATION  08/09/2011   no sign. coronary obstruction. LIMA is atretic w/excellent flow down the native LAD  . CARDIAC CATHETERIZATION  08/22/2001   no sign. coronary stenosis,  . CATARACT EXTRACTION, BILATERAL Bilateral   . CESAREAN SECTION    . CORONARY ARTERY BYPASS GRAFT  02-16-13   3'03-no bypasses -Had 2 aneurysm repairs(stents used)  . ESOPHAGOGASTRODUODENOSCOPY  04/2018   LA Grade A esophagitis, small nodule distal esophagus (benign schwann cell hamartoma), empiric dilation to 74m ([facility]bruster)  . LAPAROTOMY N/A 02/20/2013   Procedure: EXPLORATORY LAPAROTOMY;  WJanie Morning MD  . LEFT HEART CATHETERIZATION WITH CORONARY ANGIOGRAM N/A 08/09/2011   Procedure: LEFT HEART CATHETERIZATION WITH CORONARY ANGIOGRAM;  Surgeon: MSanda Klein MD;  [facility];  Service: Cardiovascular;  Laterality: N/A;  . NM MYOCAR PERF WALL MOTION  07/20/2010   normal  . SALPINGOOPHORECTOMY Bilateral 02/20/2013   benign seromucinous cystadenofibroma R ovary ([name] MD)  . TOOTH EXTRACTION    . TOTAL HIP ARTHROPLASTY Left 2015    Current Medications: Outpatient Medications Prior to Visit  Medication Sig Dispense Refill  . ACCU-CHEK FASTCLIX LANCETS MISC 1 each by Does not apply route daily. Use as directed to check blood sugar once daily.  Dx code:  E11.65 100 each 0  . albuterol (PROVENTIL) (2.5 MG/3ML) 0.083% nebulizer solution Take 3 mLs (2.5 mg total) by nebulization every 6 (six) hours as needed for wheezing or shortness of breath. 75 mL 1  . Alcohol Swabs (B-D SINGLE USE SWABS REGULAR) PADS 1 each by Does not apply route daily. Use as directed to check blood sugar once daily.  Dx code:   E11.65 100 each 0  . aspirin EC 81 MG tablet Take 81 mg by mouth daily.    . Blood Glucose Monitoring Suppl (ACCU-CHEK AVIVA PLUS) w/Device KIT 1 each by Does not apply route daily. Use as directed to check blood sugar once daily.  Dx code:  E11.65 1 kit 0  . candesartan (ATACAND) 16 MG tablet Take 0.5 tablets (8 mg total) by mouth daily. 30 tablet 6  . carboxymethylcellulose (REFRESH PLUS) 0.5 % SOLN Place 1 drop into both eyes 2 (two) times daily.    . cetirizine (ZYRTEC) 10 MG tablet Take 10 mg by mouth daily.    . Cholecalciferol (VITAMIN D) 2000 units CAPS Take 1 capsule (2,000 Units total) by mouth daily. 30 capsule   . donepezil (ARICEPT) 5 MG tablet TAKE ONE TABLET BY MOUTH AT BEDTIME 30 tablet 3  .  doxycycline (VIBRA-TABS) 100 MG tablet Take 1 tablet (100 mg total) by mouth 2 (two) times daily. For 7 days. 14 tablet 0  . famotidine (PEPCID) 20 MG tablet Take 1 tablet (20 mg total) by mouth 2 (two) times daily. 60 tablet 3  . furosemide (LASIX) 80 MG tablet Take 1 tablet by mouth daily 90 tablet 1  . glucose blood (ACCU-CHEK AVIVA PLUS) test strip 1 each by Other route as needed for other. Use as instructed to check blood sugar once daily.  Dx code:  E11.65 100 each 0  . LORazepam (ATIVAN) 0.5 MG tablet Take 1 tablet (0.5 mg total) by mouth at bedtime as needed for anxiety or sleep (may take second 1/2 dose PRN). 45 tablet 1  . morphine (MS CONTIN) 15 MG 12 hr tablet Take 1 tablet by mouth 2 (two) times daily.    . potassium chloride SA (K-DUR,KLOR-CON) 20 MEQ tablet Take 2 tablets (40 mEq total) by mouth daily. 180 tablet 1  . QUEtiapine (SEROQUEL) 25 MG tablet Take 0.5-1 tablets (12.5-25 mg total) by mouth at bedtime. 30 tablet 1   No facility-administered medications prior to visit.      Allergies:   Codeine; Penicillin g; Penicillins; Prednisolone acetate; Meloxicam; Nitroglycerin; Crestor [rosuvastatin calcium]; Lopressor [metoprolol tartrate]; Prednisone; Sulfamethoxazole; and Zetia  [ezetimibe]   Social History   Socioeconomic History  . Marital status: Widowed    Spouse name: Not on file  . Number of children: Not on file  . Years of education: Not on file  . Highest education level: Not on file  Occupational History  . Not on file  Social Needs  . Financial resource strain: Not on file  . Food insecurity:    Worry: Not on file    Inability: Not on file  . Transportation needs:    Medical: Not on file    Non-medical: Not on file  Tobacco Use  . Smoking status: Former Smoker    Packs/day: 0.10    Years: 20.00    Pack years: 2.00    Types: Cigarettes    Last attempt to quit: 09/14/2017    Years since quitting: 0.7  . Smokeless tobacco: Never Used  . Tobacco comment: smoking cessation info given  Substance and Sexual Activity  . Alcohol use: No  . Drug use: No  . Sexual activity: Never  Lifestyle  . Physical activity:    Days per week: Not on file    Minutes per session: Not on file  . Stress: Not on file  Relationships  . Social connections:    Talks on phone: Not on file    Gets together: Not on file    Attends religious service: Not on file    Active member of club or organization: Not on file    Attends meetings of clubs or organizations: Not on file    Relationship status: Not on file  Other Topics Concern  . Not on file  Social History Narrative   Widow   Lives with daughter and daughter's GF   Edu: HS   Occ: retired, worked at [employer]   Activity: no regular exercise   Diet: good water      Family History:  The patient's family history includes Breast cancer in her maternal aunt; Colon polyps in her maternal grandfather; Coronary artery disease in her father; Dementia (age of onset: 43) in her mother; Diabetes in her brother; Hypertension in her brother and father; Hypotension in her mother and sister;  Lung cancer in her daughter; Stroke in her mother; Thyroid cancer in her daughter.   ROS:   Please see the history of present illness.     ROS All other systems reviewed and are negative.   PHYSICAL EXAM:   VS:  BP 106/63   Pulse 94   Ht 5' 2"  (1.575 m)   Wt 139 lb 6.4 oz (63.2 kg)   BMI 25.50 kg/m    GEN: Well nourished, well developed, in no acute distress  HEENT: normal  Neck: no JVD, carotid bruits, or masses Cardiac: RRR; no murmurs, rubs, or gallops,no edema  Respiratory:  clear to auscultation bilaterally, normal work of breathing GI: soft, nontender, nondistended, + BS MS: no deformity or atrophy  Skin: warm and dry, no rash Neuro:  Alert and Oriented x 3, Strength and sensation are intact Psych: euthymic mood, full affect  Wt Readings from Last 3 Encounters:  06/22/18 139 lb 6.4 oz (63.2 kg)  06/05/18 140 lb 6.4 oz (63.7 kg)  06/01/18 142 lb 4.8 oz (64.5 kg)      Studies/Labs Reviewed:   EKG:  EKG is not ordered today.    Recent Labs: 12/10/2017: B Natriuretic Peptide 76.8 04/18/2018: NT-Pro BNP 235 04/19/2018: ALT 22; Hemoglobin 12.4; Platelets 319; TSH 0.463 05/08/2018: Magnesium 1.9 06/05/2018: BUN 35; Creatinine, Ser 0.88; Potassium 4.7; Sodium 135   Lipid Panel    Component Value Date/Time   CHOL 184 04/07/2018 0841   TRIG 286.0 (H) 04/07/2018 0841   HDL 36.40 (L) 04/07/2018 0841   CHOLHDL 5 04/07/2018 0841   VLDL 57.2 (H) 04/07/2018 0841   LDLCALC 123 02/06/2016   LDLDIRECT 122.0 04/07/2018 0841    Additional studies/ records that were reviewed today include:   Echo 09/01/2017 LV EF: 55% - 60% Study Conclusions  - Left ventricle: The cavity size was normal. There was mild concentric hypertrophy. Systolic function was normal. The estimated ejection fraction was in the range of 55% to 60%. Wall motion was normal; there were no regional wall motion abnormalities. Doppler parameters are consistent with abnormal left ventricular relaxation (grade 1 diastolic dysfunction). - Aortic valve: Trileaflet; moderately thickened, severely calcified leaflets. Transvalvular  velocity was within the normal range. There was no stenosis.   ASSESSMENT:    1. Coronary artery disease involving coronary bypass graft of native heart without angina pectoris   2. Medication management   3. Essential hypertension   4. Hyperlipidemia LDL goal <70   5. Controlled type 2 diabetes mellitus without complication, without long-term current use of insulin ([place])   6. Hypothyroidism, unspecified type      PLAN:  In order of problems listed above:  1. Chronic diastolic heart failure: She is euvolemic on the lower dose of Lasix 80 mg daily.  Note, her previous dose of Lasix was 80 mg twice daily.  Despite on the lower dose of Lasix, she seems to be doing quite well.  Her weight is actually decreasing, I think this is related to her appetite.  In the past 2 days, she had picked up her appetite  2. CAD s/p CABG: Denies any recent chest discomfort  3. Right buttock abscess: She had a large right buttock access during the last office visit, I gave her a course of doxycycline, this has cleared up  4. Hypertension: Blood pressure stable on current therapy  5. Hyperlipidemia: Not on statin.  This will need to be reconsidered on follow-up  6. DM2: Managed by primary care provider  7.  Hypothyroidism: We will defer to primary care provider    Medication Adjustments/Labs and Tests Ordered: Current medicines are reviewed at length with the patient today.  Concerns regarding medicines are outlined above.  Medication changes, Labs and Tests ordered today are listed in the Patient Instructions below. Patient Instructions  Medication Instructions:  Continue current medications  If you need a refill on your cardiac medications before your next appointment, please call your pharmacy.   Lab work: [employer] Today  If you have labs (blood work) drawn today and your tests are completely normal, you will receive your results only by: Marland Kitchen MyChart Message (if you have MyChart) OR . A paper  copy in the mail If you have any lab test that is abnormal or we need to change your treatment, we will call you to review the results.  Testing/Procedures: None Ordered  Follow-Up: At [facility], you and your health needs are our priority.  As part of our continuing mission to provide you with exceptional heart care, we have created designated Provider Care Teams.  These Care Teams include your primary Cardiologist (physician) and Advanced Practice Providers (APPs -  Physician Assistants and Nurse Practitioners) who all work together to provide you with the care you need, when you need it. . Your physician recommends that you schedule a follow-up appointment in: Keep appointment with Dr Sallyanne Kuster on 03/06 @ 1:40 pm  Any Other Special Instructions Will Be Listed Below (If Applicable).       Hilbert Corrigan, [place]  06/24/2018 11:56 PM    [facility] [address], [place], [place]  [address] Phone: [phone]; Fax: [phone]

## 2018-06-24 ENCOUNTER — Encounter: Payer: Self-pay | Admitting: Physician Assistant

## 2018-06-26 ENCOUNTER — Other Ambulatory Visit: Payer: Medicare HMO

## 2018-06-26 ENCOUNTER — Ambulatory Visit (INDEPENDENT_AMBULATORY_CARE_PROVIDER_SITE_OTHER)
Admission: RE | Admit: 2018-06-26 | Discharge: 2018-06-26 | Disposition: A | Discharge status: Home / Self Care | Payer: Medicare HMO | Source: Ambulatory Visit | Attending: Family Medicine | Admitting: Family Medicine

## 2018-06-26 DIAGNOSIS — R918 Other nonspecific abnormal finding of lung field: Secondary | ICD-10-CM

## 2018-06-26 DIAGNOSIS — Z6825 Body mass index (BMI) 25.0-25.9, adult: Secondary | ICD-10-CM | POA: Diagnosis not present

## 2018-06-26 DIAGNOSIS — M47816 Spondylosis without myelopathy or radiculopathy, lumbar region: Secondary | ICD-10-CM | POA: Diagnosis not present

## 2018-06-28 NOTE — Progress Notes (Signed)
Thanks MCr 

## 2018-06-29 ENCOUNTER — Encounter: Payer: Self-pay | Admitting: Family Medicine

## 2018-06-29 DIAGNOSIS — I700 Atherosclerosis of aorta: Secondary | ICD-10-CM | POA: Insufficient documentation

## 2018-07-06 ENCOUNTER — Telehealth: Payer: Self-pay | Admitting: *Deleted

## 2018-07-06 NOTE — Telephone Encounter (Addendum)
Noted. seroquel removed from list

## 2018-07-06 NOTE — Telephone Encounter (Signed)
Yes - we can go back on ativan. She should have 1 refill available of lorazepam 0.5mg  #45 at pharmacy.  What adverse reaction did she have - or was it just not efffective?

## 2018-07-06 NOTE — Telephone Encounter (Signed)
Spoke with pt's daughter, [name] (on dpr), relaying Dr. Synthia Innocent message. Verbalizes understanding and expresses her thanks. She mentioned that the Seroquel had her "hyped up".

## 2018-07-06 NOTE — Telephone Encounter (Signed)
Spoke to pts daughter [name] who states pt was prescribed seroquil for sleep but is seems to have an adverse reaction. Pt is only sleeping appx 2hrs per night, and she is wanting to know if pt can be placed back on ativan. pls advise

## 2018-07-06 NOTE — Addendum Note (Signed)
Addended by: Ria Bush on: 07/06/2018 05:13 PM   Modules accepted: Orders

## 2018-07-24 DIAGNOSIS — Z79899 Other long term (current) drug therapy: Secondary | ICD-10-CM | POA: Diagnosis not present

## 2018-07-25 LAB — BASIC METABOLIC PANEL
BUN/Creatinine Ratio: 32 — ABNORMAL HIGH (ref 12–28)
BUN: 26 mg/dL (ref 8–27)
CO2: 24 mmol/L (ref 20–29)
Calcium: 9.4 mg/dL (ref 8.7–10.3)
Chloride: 95 mmol/L — ABNORMAL LOW (ref 96–106)
Creatinine, Ser: 0.82 mg/dL (ref 0.57–1.00)
GFR calc Af Amer: 79 mL/min/{1.73_m2} (ref >59)
GFR calc non Af Amer: 68 mL/min/{1.73_m2} (ref >59)
Glucose: 173 mg/dL — ABNORMAL HIGH (ref 65–99)
Potassium: 5.1 mmol/L (ref 3.5–5.2)
Sodium: 134 mmol/L (ref 134–144)

## 2018-07-26 NOTE — Progress Notes (Signed)
The patient has been notified of the result and verbalized understanding.  All questions (if any) were answered. Jacqulynn Cadet, CMA 07/26/2018 1:26 PM

## 2018-07-27 ENCOUNTER — Ambulatory Visit (INDEPENDENT_AMBULATORY_CARE_PROVIDER_SITE_OTHER)
Admission: RE | Admit: 2018-07-27 | Discharge: 2018-07-27 | Disposition: A | Discharge status: Home / Self Care | Payer: Medicare HMO | Source: Ambulatory Visit | Attending: Family Medicine | Admitting: Family Medicine

## 2018-07-27 ENCOUNTER — Encounter: Payer: Self-pay | Admitting: Family Medicine

## 2018-07-27 ENCOUNTER — Ambulatory Visit (INDEPENDENT_AMBULATORY_CARE_PROVIDER_SITE_OTHER): Payer: Medicare HMO | Admitting: Family Medicine

## 2018-07-27 VITALS — BP 138/74 | HR 72 | Temp 97.4°F | Ht 60.0 in | Wt 143.5 lb

## 2018-07-27 DIAGNOSIS — M25512 Pain in left shoulder: Secondary | ICD-10-CM

## 2018-07-27 DIAGNOSIS — M7502 Adhesive capsulitis of left shoulder: Secondary | ICD-10-CM

## 2018-07-27 DIAGNOSIS — M19012 Primary osteoarthritis, left shoulder: Secondary | ICD-10-CM | POA: Diagnosis not present

## 2018-07-27 NOTE — Progress Notes (Signed)
Subjective:     Meredith Gutierrez is a 78 y.o. female presenting for Arm Pain (Left arm pain. Patient messed up her rotator cuffo n the left when she was younger and has not been able to use left arm since then well. But in the last 2 to 3 weeks have not been able to move it at all on its own. Not sure if there were any recent injury/trauma to it. )     Arm Pain   The incident occurred more than 1 week ago. There was no injury mechanism (though pt with dementia). The pain is present in the left shoulder. The quality of the pain is described as aching and stabbing. The pain does not radiate. The pain is moderate. The pain has been intermittent since the incident. Pertinent negatives include no chest pain, muscle weakness, numbness or tingling. The symptoms are aggravated by movement. Treatments tried: on morphine for back pain. The treatment provided mild relief.   Hx of rotator cuff injury to that side  Review of Systems  Cardiovascular: Negative for chest pain.  Neurological: Negative for tingling and numbness.     Social History   Tobacco Use  Smoking Status Former Smoker  . Packs/day: 0.10  . Years: 20.00  . Pack years: 2.00  . Types: Cigarettes  . Last attempt to quit: 09/14/2017  . Years since quitting: 0.8  Smokeless Tobacco Never Used  Tobacco Comment   smoking cessation info given        Objective:    BP Readings from Last 3 Encounters:  07/27/18 138/74  06/22/18 106/63  06/05/18 (!) 118/58   Wt Readings from Last 3 Encounters:  07/27/18 143 lb 8 oz (65.1 kg)  06/22/18 139 lb 6.4 oz (63.2 kg)  06/05/18 140 lb 6.4 oz (63.7 kg)    BP 138/74   Pulse 72   Temp (!) 97.4 F (36.3 C)   Ht 5' (1.524 m)   Wt 143 lb 8 oz (65.1 kg)   BMI 28.03 kg/m    Physical Exam Constitutional:      General: She is not in acute distress.    Appearance: She is not ill-appearing.  HENT:     Head: Normocephalic and atraumatic.     Right Ear: External ear normal.     Left  Ear: External ear normal.     Nose: Nose normal.     Mouth/Throat:     Mouth: Mucous membranes are moist.  Neck:     Musculoskeletal: Normal range of motion.  Cardiovascular:     Rate and Rhythm: Normal rate.  Pulmonary:     Effort: Pulmonary effort is normal.  Musculoskeletal:     Comments: Left shoulder:  Inspection: no erythema, no swelling, held with shoulder close to body and arm at the side Palpation: TTP along the posterior upper arm and lower shoulder. Also some TTP along the bruising on lower arm. No other TTP ROM: significant pain with ROM and reduced along anterior flexion, abduction, and external rotation. Internal rotation limited but close to right side  Skin:    General: Skin is warm and dry.     Comments: Scattered bruises on the left upper extremity  Neurological:     Mental Status: She is alert. Mental status is at baseline.  Psychiatric:        Mood and Affect: Mood normal.      XR Shoulder: no acute fracture seen     Assessment & Plan:  Problem List Items Addressed This Visit    None    Visit Diagnoses    Acute pain of left shoulder    -  Primary   Relevant Orders   DG Shoulder Left   Ambulatory referral to Physical Therapy   Adhesive capsulitis of left shoulder       Relevant Orders   Ambulatory referral to Physical Therapy     Most likely adhesive capsulitis given normal XR and exam. Recommend rest, PT to help with gentle ROM activities given dementia feel additional support appropriate.   If not improving will need follow-up with either Dr. Lorelei Pont or Ortho to get Intra-articular joint injection.   Return in about 3 weeks (around 08/17/2018).  Lesleigh Noe, MD

## 2018-07-27 NOTE — Patient Instructions (Addendum)
Take 500 mg of tylenol three times a day If not improving in 3-4 weeks, return to see Dr. Margaretmary Eddy Capsulitis  Adhesive capsulitis, also called frozen shoulder, causes the shoulder to become stiff and painful to move. This condition happens when there is inflammation of the tendons and ligaments that surround the shoulder joint (shoulder capsule). What are the causes? This condition may be caused by:  An injury to your shoulder joint.  Straining your shoulder.  Not moving your shoulder for a period of time. This can happen if your arm was injured or in a sling.  Long-standing conditions, such as: ? Diabetes. ? Thyroid problems. ? Heart disease. ? Stroke. ? Rheumatoid arthritis. ? Lung disease. In some cases, the cause is not known. What increases the risk? You are more likely to develop this condition if you are:  A woman.  Older than 80 years of age. What are the signs or symptoms? Symptoms of this condition include:  Pain in your shoulder when you move your arm. There may also be pain when parts of your shoulder are touched. The pain may be worse at night or when you are resting.  A sore or aching shoulder.  The inability to move your shoulder normally.  Muscle spasms. How is this diagnosed? This condition is diagnosed with a physical exam and imaging tests, such as an X-ray or MRI. How is this treated? This condition may be treated with:  Treatment of the underlying cause or condition.  Medicine. Medicine may be given to relieve pain, inflammation, or muscle spasms.  Steroid injections into the shoulder joint.  Physical therapy. This involves performing exercises to get the shoulder moving again.  Acupuncture. This is a type of treatment that involves stimulating specific points on your body by inserting thin needles through your skin.  Shoulder manipulation. This is a procedure to move the shoulder into another position. It is done after you are  given a medicine to make you fall asleep (general anesthetic). The joint may also be injected with salt water at high pressure to break down scarring.  Surgery. This may be done in severe cases when other treatments have failed. Although most people recover completely from adhesive capsulitis, some may not regain full shoulder movement. Follow these instructions at home: Managing pain, stiffness, and swelling      If directed, put ice on the injured area: ? Put ice in a plastic bag. ? Place a towel between your skin and the bag. ? Leave the ice on for 20 minutes, 2-3 times per day.  If directed, apply heat to the affected area before you exercise. Use the heat source that your health care provider recommends, such as a moist heat pack or a heating pad. ? Place a towel between your skin and the heat source. ? Leave the heat on for 20-30 minutes. ? Remove the heat if your skin turns bright red. This is especially important if you are unable to feel pain, heat, or cold. You may have a greater risk of getting burned. General instructions  Take over-the-counter and prescription medicines only as told by your health care provider.  If you are being treated with physical therapy, follow instructions from your physical therapist.  Avoid exercises that put a lot of demand on your shoulder, such as throwing. These exercises can make pain worse.  Keep all follow-up visits as told by your health care provider. This is important. Contact a health care provider if:  You develop new symptoms.  Your symptoms get worse. Summary  Adhesive capsulitis, also called frozen shoulder, causes the shoulder to become stiff and painful to move.  You are more likely to have this condition if you are a woman and over age 80.  It is treated with physical therapy, medicines, and sometimes surgery. This information is not intended to replace advice given to you by your health care provider. Make sure you  discuss any questions you have with your health care provider. Document Released: 03/28/2009 Document Revised: 11/04/2017 Document Reviewed: 11/04/2017 Elsevier Interactive Patient Education  2019 [employer].

## 2018-07-28 ENCOUNTER — Telehealth: Payer: Self-pay

## 2018-07-28 DIAGNOSIS — M7502 Adhesive capsulitis of left shoulder: Secondary | ICD-10-CM

## 2018-07-28 NOTE — Telephone Encounter (Signed)
Left message for [name] to call back about results

## 2018-07-31 NOTE — Telephone Encounter (Signed)
Meredith Gutierrez advised. Patient is having a lot of stiffness in that arm. Doing home exercises.  Pain is about the same, not getting better. Please see XRAY notes. Routing this as FYI to Dr. Einar Pheasant and Dr. Carmela Hurt aware Dr. Darnell Level is out of the office for a few days and if he has any other response to this they will hear from [place] at a later time. Meredith Gutierrez said patient will continue doing exercises.

## 2018-07-31 NOTE — Telephone Encounter (Signed)
[name] returning call for pt

## 2018-07-31 NOTE — Addendum Note (Signed)
Addended by: Lesleigh Noe on: 07/31/2018 03:34 PM   Modules accepted: Orders

## 2018-07-31 NOTE — Telephone Encounter (Signed)
Left message for [name] to call back about Xray results on the patient.

## 2018-07-31 NOTE — Telephone Encounter (Signed)
Referral placed for orthopedic surgery if they want to pursue injection in the shoulder to see if that would help.   Could still continue home exercises but if no improvement would recommend ortho for an injection

## 2018-08-02 ENCOUNTER — Encounter: Payer: Self-pay | Admitting: Family Medicine

## 2018-08-02 DIAGNOSIS — Q859 Phakomatosis, unspecified: Secondary | ICD-10-CM | POA: Insufficient documentation

## 2018-08-10 ENCOUNTER — Other Ambulatory Visit: Payer: Self-pay | Admitting: Family Medicine

## 2018-08-10 NOTE — Telephone Encounter (Signed)
Name of Medication: Lorazepam Name of Pharmacy: [facility] Date and Quantity: 07/12/18, #45 Last Office Visit and Type: 07/27/18, acute Next Office Visit and Type: 08/17/18, acute Last Controlled Substance Agreement Date: none Last UDS: none

## 2018-08-11 ENCOUNTER — Telehealth: Payer: Self-pay

## 2018-08-11 NOTE — Telephone Encounter (Signed)
Eprescribed.

## 2018-08-11 NOTE — Telephone Encounter (Signed)
Left message for [name], daughter, to call back to get an update on patient and her arm pain. If not better do they want to proceed with referral to orthopedic? Referral already placed by Dr. Einar Pheasant

## 2018-08-14 NOTE — Telephone Encounter (Signed)
Ok thanks 

## 2018-08-14 NOTE — Telephone Encounter (Signed)
Dr. Darnell Level,  I see patient has an appointment with you on 08/17/2018. Do you mind addressing this with patient and daughter so I can update referral notes. Thank you.

## 2018-08-17 ENCOUNTER — Encounter: Payer: Self-pay | Admitting: Family Medicine

## 2018-08-17 ENCOUNTER — Ambulatory Visit (INDEPENDENT_AMBULATORY_CARE_PROVIDER_SITE_OTHER): Payer: Medicare HMO | Admitting: Family Medicine

## 2018-08-17 VITALS — BP 132/80 | HR 60 | Temp 98.2°F | Ht 60.0 in | Wt 144.5 lb

## 2018-08-17 DIAGNOSIS — L0293 Carbuncle, unspecified: Secondary | ICD-10-CM | POA: Insufficient documentation

## 2018-08-17 DIAGNOSIS — E1165 Type 2 diabetes mellitus with hyperglycemia: Secondary | ICD-10-CM | POA: Diagnosis not present

## 2018-08-17 DIAGNOSIS — IMO0001 Reserved for inherently not codable concepts without codable children: Secondary | ICD-10-CM

## 2018-08-17 DIAGNOSIS — F05 Delirium due to known physiological condition: Secondary | ICD-10-CM

## 2018-08-17 DIAGNOSIS — F0150 Vascular dementia without behavioral disturbance: Secondary | ICD-10-CM | POA: Diagnosis not present

## 2018-08-17 DIAGNOSIS — M25512 Pain in left shoulder: Secondary | ICD-10-CM | POA: Insufficient documentation

## 2018-08-17 DIAGNOSIS — F0390 Unspecified dementia without behavioral disturbance: Secondary | ICD-10-CM | POA: Diagnosis not present

## 2018-08-17 MED ORDER — DOXYCYCLINE HYCLATE 100 MG PO TABS: 100.0000 mg | ORAL_TABLET | Freq: Two times a day (BID) | ORAL | 0 refills | Status: DC

## 2018-08-17 MED ORDER — CHLORHEXIDINE GLUCONATE 4 % EX LIQD: Freq: Every day | CUTANEOUS | 0 refills | Status: DC | PRN

## 2018-08-17 MED ORDER — METFORMIN HCL ER 500 MG PO TB24: 500.0000 mg | ORAL_TABLET | Freq: Every day | ORAL | 3 refills | Status: DC

## 2018-08-17 NOTE — Assessment & Plan Note (Signed)
Stable period on 1.5 tab ativan at night.

## 2018-08-17 NOTE — Progress Notes (Signed)
BP 132/80 (BP Location: Left Arm, Patient Position: Sitting, Cuff Size: Normal)   Pulse 60   Temp 98.2 F (36.8 C) (Oral)   Ht 5' (1.524 m)   Wt 144 lb 8 oz (65.5 kg)   SpO2 100%   BMI 28.22 kg/m    CC: 3 mo DM f/u visit Subjective:    Patient ID: Meredith Gutierrez, female    DOB: Apr 02, 1941, 78 y.o.   MRN: [id number]  HPI: Meredith Gutierrez is a 78 y.o. female presenting on 08/17/2018 for Diabetes (Here for 3 mo f/u. [Too early for A1c]) and Skin Lesions (C/o boils on left elbow, upper left leg and on bilateral buttocks. Off and on for a few mos. Treated in past with oral abx, helpful. )   Daughter helps with history due to dementia. Sleeping better on ativan 1.5 tab at night, more alert during the day with this change.   DM - does regularly check sugars at home - am readings 177-225. Compliant with antihyperglycemic regimen which includes: diet controlled - has trouble with frequent eating due to forgetting what she ate. Denies low sugars or hypoglycemic symptoms. Denies paresthesias. Last diabetic eye exam - upcoming appt this week. Pneumovax: 2010. Prevnar: 2016. Glucometer brand: accuchek. DSME: has not completed. Lab Results  Component Value Date   HGBA1C 8.4 (H) 04/20/2018   Diabetic Foot Exam - Simple   No data filed     No results found for: Derl Barrow   Saw Dr Einar Pheasant last month with acute L shoulder pain thought adhesive capsulitis, discussed rest, PT. Shoulder is doing better. H/o remote fall with injury to L RTC.   Noticing "boils" on skin to buttock, now seeing them near elbows. Neosporin helps some. Daughter has started probiotics for patient. Has been using warm copmress/heating pad.      Relevant past medical, surgical, family and social history reviewed and updated as indicated. Interim medical history since our last visit reviewed. Allergies and medications reviewed and updated. Outpatient Medications Prior to Visit  Medication Sig Dispense Refill  .  ACCU-CHEK FASTCLIX LANCETS MISC 1 each by Does not apply route daily. Use as directed to check blood sugar once daily.  Dx code:  E11.65 100 each 0  . albuterol (PROVENTIL) (2.5 MG/3ML) 0.083% nebulizer solution Take 3 mLs (2.5 mg total) by nebulization every 6 (six) hours as needed for wheezing or shortness of breath. 75 mL 1  . Alcohol Swabs (B-D SINGLE USE SWABS REGULAR) PADS 1 each by Does not apply route daily. Use as directed to check blood sugar once daily.  Dx code:  E11.65 100 each 0  . aspirin EC 81 MG tablet Take 81 mg by mouth daily.    . Blood Glucose Monitoring Suppl (ACCU-CHEK AVIVA PLUS) w/Device KIT 1 each by Does not apply route daily. Use as directed to check blood sugar once daily.  Dx code:  E11.65 1 kit 0  . candesartan (ATACAND) 16 MG tablet Take 0.5 tablets (8 mg total) by mouth daily. 30 tablet 6  . carboxymethylcellulose (REFRESH PLUS) 0.5 % SOLN Place 1 drop into both eyes 2 (two) times daily.    . cetirizine (ZYRTEC) 10 MG tablet Take 10 mg by mouth daily.    . Cholecalciferol (VITAMIN D) 2000 units CAPS Take 1 capsule (2,000 Units total) by mouth daily. 30 capsule   . donepezil (ARICEPT) 5 MG tablet TAKE ONE TABLET BY MOUTH AT BEDTIME 30 tablet 3  . furosemide (LASIX) 80  MG tablet Take 1 tablet by mouth daily 90 tablet 1  . glucose blood (ACCU-CHEK AVIVA PLUS) test strip 1 each by Other route as needed for other. Use as instructed to check blood sugar once daily.  Dx code:  E11.65 100 each 0  . LORazepam (ATIVAN) 0.5 MG tablet TAKE 1 TABLET BY MOUTH AT BEDTIME AS NEEDED FOR ANXIETY OR SLEEP MAY TAKE SECOND 1/2 TABLET DOSE AS NEEDED 45 tablet 0  . morphine (MS CONTIN) 15 MG 12 hr tablet Take 1 tablet by mouth 2 (two) times daily.    . potassium chloride SA (K-DUR,KLOR-CON) 20 MEQ tablet Take 2 tablets (40 mEq total) by mouth daily. 180 tablet 1  . famotidine (PEPCID) 20 MG tablet Take 1 tablet (20 mg total) by mouth 2 (two) times daily. 60 tablet 3   No  facility-administered medications prior to visit.      Per HPI unless specifically indicated in ROS section below Review of Systems Objective:    BP 132/80 (BP Location: Left Arm, Patient Position: Sitting, Cuff Size: Normal)   Pulse 60   Temp 98.2 F (36.8 C) (Oral)   Ht 5' (1.524 m)   Wt 144 lb 8 oz (65.5 kg)   SpO2 100%   BMI 28.22 kg/m   Wt Readings from Last 3 Encounters:  08/17/18 144 lb 8 oz (65.5 kg)  07/27/18 143 lb 8 oz (65.1 kg)  06/22/18 139 lb 6.4 oz (63.2 kg)    Physical Exam Vitals signs and nursing note reviewed.  Constitutional:      General: She is not in acute distress.    Appearance: Normal appearance. She is well-developed. She is not Gutierrez-appearing.  HENT:     Head: Normocephalic and atraumatic.     Right Ear: External ear normal.     Left Ear: External ear normal.     Nose: Nose normal.     Mouth/Throat:     Pharynx: No oropharyngeal exudate.  Eyes:     General: No scleral icterus.    Conjunctiva/sclera: Conjunctivae normal.     Pupils: Pupils are equal, round, and reactive to light.  Neck:     Musculoskeletal: Normal range of motion and neck supple.  Cardiovascular:     Rate and Rhythm: Normal rate and regular rhythm.     Heart sounds: Normal heart sounds. No murmur.  Pulmonary:     Effort: Pulmonary effort is normal. No respiratory distress.     Breath sounds: Normal breath sounds. No wheezing or rales.  Musculoskeletal:     Comments: See HPI for foot exam if done  Lymphadenopathy:     Cervical: No cervical adenopathy.  Skin:    General: Skin is warm and dry.     Findings: Erythema and rash present.     Comments: Erythematous indurated erosion without fluctuance at R dorsal forearm, and bilateral posterior thighs. Old scarred sites bilateral posterior thighs  Neurological:     Mental Status: She is alert.  Psychiatric:        Mood and Affect: Mood normal.       Results for orders placed or performed in visit on 63/84/66  Basic  Metabolic Panel (BMET)  Result Value Ref Range   Glucose 173 (H) 65 - 99 mg/dL   BUN 26 8 - 27 mg/dL   Creatinine, Ser 0.82 0.57 - 1.00 mg/dL   GFR calc non Af Amer 68 >59 mL/min/1.73   GFR calc Af Amer 79 >59 mL/min/1.73   BUN/Creatinine  Ratio 32 (H) 12 - 28   Sodium 134 134 - 144 mmol/L   Potassium 5.1 3.5 - 5.2 mmol/L   Chloride 95 (L) 96 - 106 mmol/L   CO2 24 20 - 29 mmol/L   Calcium 9.4 8.7 - 10.3 mg/dL   Lab Results  Component Value Date   WBC 10.6 (H) 04/19/2018   HGB 12.4 04/19/2018   HCT 35.3 (L) 04/19/2018   MCV 80.2 04/19/2018   PLT 319 04/19/2018    Assessment & Plan:   Problem List Items Addressed This Visit    Uncontrolled diabetes mellitus type 2 without complications ([place]) - Primary    Chronic. Elevated readings when checked at home. Too soon for updated A1c. Start metformin XR 547m daily. Discussed GI side effects to monitor for. RTC 3 wks DM f/u (previously scheduled).      Relevant Medications   metFORMIN (GLUCOPHAGE XR) 500 MG 24 hr tablet   Sundowning    Stable period on 1.5 tab ativan at night.       Recurrent boils    Around bottom/posterior thighs, new lesion near L elbow. Anticipate MRSA. No fluctuance, nothing to drain or to culture. Rx doxy 10d course, Rx hibiclens wash to skin, refer to derm to discuss long term strategy. Reviewed importance of tighter glycemic control. Consider decolonization regimen of 5d mupirocin ointment course - will discuss at f/u.      Dementia ([place]    Stable period on low dose aricept and ativan at night for sleep. Discussed long term consequences of ativan use on memory - encouraged to try to taper down dose if able.       Acute pain of left shoulder    Previous eval thought frozen shoulder - this is improving.           Meds ordered this encounter  Medications  . chlorhexidine (HIBICLENS) 4 % external liquid    Sig: Apply topically daily as needed.    Dispense:  473 mL    Refill:  0  . doxycycline  (VIBRA-TABS) 100 MG tablet    Sig: Take 1 tablet (100 mg total) by mouth 2 (two) times daily.    Dispense:  20 tablet    Refill:  0  . metFORMIN (GLUCOPHAGE XR) 500 MG 24 hr tablet    Sig: Take 1 tablet (500 mg total) by mouth daily after supper.    Dispense:  30 tablet    Refill:  3   No orders of the defined types were placed in this encounter.   Follow up plan: No follow-ups on file.  JRia Bush MD

## 2018-08-17 NOTE — Telephone Encounter (Signed)
Noted! Thank you

## 2018-08-17 NOTE — Assessment & Plan Note (Signed)
Previous eval thought frozen shoulder - this is improving.

## 2018-08-17 NOTE — Assessment & Plan Note (Addendum)
Chronic. Elevated readings when checked at home. Too soon for updated A1c. Start metformin XR 500mg  daily. Discussed GI side effects to monitor for. RTC 3 wks DM f/u (previously scheduled).

## 2018-08-17 NOTE — Assessment & Plan Note (Addendum)
Around bottom/posterior thighs, new lesion near L elbow. Anticipate MRSA. No fluctuance, nothing to drain or to culture. Rx doxy 10d course, Rx hibiclens wash to skin, refer to derm to discuss long term strategy. Reviewed importance of tighter glycemic control. Consider decolonization regimen of 5d mupirocin ointment course - will discuss at f/u.

## 2018-08-17 NOTE — Telephone Encounter (Signed)
Shoulder is doing better- declines referral at this time.

## 2018-08-17 NOTE — Patient Instructions (Addendum)
Sugars are staying too high - start metformin xr 500mg  with dinner.  Ask eye doctor to send me his report.  Keep follow up appointment.  For recurrent boils - start doxycycline course. Start hibiclens solution.  We will refer you to dermatology for recommendations on ongoing treatment.

## 2018-08-17 NOTE — Assessment & Plan Note (Addendum)
Stable period on low dose aricept and ativan at night for sleep. Discussed long term consequences of ativan use on memory - encouraged to try to taper down dose if able.

## 2018-08-18 ENCOUNTER — Ambulatory Visit: Payer: Medicare HMO | Admitting: Cardiovascular Disease

## 2018-08-18 ENCOUNTER — Encounter: Payer: Self-pay | Admitting: Cardiovascular Disease

## 2018-08-18 VITALS — BP 120/72 | HR 68 | Ht 60.0 in | Wt 145.4 lb

## 2018-08-18 DIAGNOSIS — I5032 Chronic diastolic (congestive) heart failure: Secondary | ICD-10-CM

## 2018-08-18 DIAGNOSIS — I10 Essential (primary) hypertension: Secondary | ICD-10-CM

## 2018-08-18 DIAGNOSIS — Z8679 Personal history of other diseases of the circulatory system: Secondary | ICD-10-CM | POA: Diagnosis not present

## 2018-08-18 DIAGNOSIS — E7800 Pure hypercholesterolemia, unspecified: Secondary | ICD-10-CM | POA: Diagnosis not present

## 2018-08-18 DIAGNOSIS — E1165 Type 2 diabetes mellitus with hyperglycemia: Secondary | ICD-10-CM | POA: Diagnosis not present

## 2018-08-18 DIAGNOSIS — E871 Hypo-osmolality and hyponatremia: Secondary | ICD-10-CM

## 2018-08-18 DIAGNOSIS — E119 Type 2 diabetes mellitus without complications: Secondary | ICD-10-CM

## 2018-08-18 DIAGNOSIS — I700 Atherosclerosis of aorta: Secondary | ICD-10-CM | POA: Diagnosis not present

## 2018-08-18 DIAGNOSIS — I25810 Atherosclerosis of coronary artery bypass graft(s) without angina pectoris: Secondary | ICD-10-CM

## 2018-08-18 NOTE — Progress Notes (Signed)
Cardiology Office Note    Date:  08/19/2018   ID:  Meredith Gutierrez, Meredith Gutierrez 09/03/1940, MRN [id number]  PCP:  Ria Bush, MD  Cardiologist:   Sanda Klein, MD   Chief Complaint  Patient presents with  . Congestive Heart Failure  . Coronary Artery Disease    History of Present Illness:  Meredith Gutierrez is a 78 y.o. female with CAD S/P CABG (2003 LIMA to LAD and SVG to first diagonal, ligation of large LAD artery aneurysm, atretic LIMA with patent LAD at cardiac cath in 2013), without coronary events since that time. She has hyperdynamic left ventricular systolic function, moderate LVH, chronic diastolic heart failure, hyperlipidemia intolerant to numerous attempts at lipid lowering therapy (rosuvastatin, simvastatin, Zetia and others). She is unable to take beta blockers due to bradycardia.   Recently she has had recurrent problems with hyperkalemia, followed by excessive diuresis, but she seems to have finally stabilized and feels well.  She does not have orthopnea or PND.  She denies dizziness lightheadedness or syncope.  She does not have leg edema.  She has not had chest pain since last time.  Her home scale today she weighed 143 pounds (145 pounds on our office scale).  This is close to our previous estimation of her "dry weight" of 140-145 pounds on her home scale.  Blood pressure control has been excellent.  As always, her daughter helps a lot with her review of systems, since Mrs. San is having worsening short-term memory loss.    She was previously diagnosed as having renal artery stenosis due to fibromuscular dysplasia, but the most recent duplex ultrasound studies performed in 2016 and 2018 did not show meaningful stenosis.  Past Medical History:  Diagnosis Date  . Angina    never needed to take nitroglycerin.  . Anxiety   . Blood transfusion without reported diagnosis   . Cerebral atherosclerosis   . COPD (chronic obstructive pulmonary disease) ([place])    02-16-13-"pt. denies this"-shows on CXR of 2- 2013.  Marland Kitchen Coronary artery disease 2003   s/p CABG 2014  . Diabetes mellitus without complication ([place]) 25/0037   NEW ONSET  . Diastolic CHF, on admit treated. 08/10/2011  . Family history of adverse reaction to anesthesia    SISTER GETS SICK  . Fibromuscular dysplasia of renal artery (HCC)    right  . Heart murmur   . History of kidney stones 02-16-13   "hx. of overgrowth of muscle-causing some stricture"renal artery stenosis-being followed [facility]  . Hyperlipidemia   . Hypertension   . Hyperthyroidism    [place]  . Hyponatremia 04/2018  . Melanoma ([place]) 2010   skin cancers, 1 melanoma removed back  . Osteoarthritis     Past Surgical History:  Procedure Laterality Date  . ABDOMINAL HYSTERECTOMY  1970   heavy bleeding  . APPENDECTOMY  1960s  . CARDIAC CATHETERIZATION  08/09/2011   no sign. coronary obstruction. LIMA is atretic w/excellent flow down the native LAD  . CARDIAC CATHETERIZATION  08/22/2001   no sign. coronary stenosis,  . CATARACT EXTRACTION, BILATERAL Bilateral   . CESAREAN SECTION    . CORONARY ARTERY BYPASS GRAFT  02-16-13   3'03-no bypasses -Had 2 aneurysm repairs(stents used)  . ESOPHAGOGASTRODUODENOSCOPY  04/2018   LA Grade A esophagitis, small nodule distal esophagus (benign schwann cell hamartoma), empiric dilation to 65m ([facility])  . LAPAROTOMY N/A 02/20/2013   Procedure: EXPLORATORY LAPAROTOMY;  WJanie Morning MD  . LEFT HEART CATHETERIZATION WITH CORONARY ANGIOGRAM N/A  08/09/2011   Procedure: LEFT HEART CATHETERIZATION WITH CORONARY ANGIOGRAM;  Surgeon: Sanda Klein, MD;  Location: [facility];  Service: Cardiovascular;  Laterality: N/A;  . NM MYOCAR PERF WALL MOTION  07/20/2010   normal  . SALPINGOOPHORECTOMY Bilateral 02/20/2013   benign seromucinous cystadenofibroma R ovary [name], MD)  . TOOTH EXTRACTION    . TOTAL HIP ARTHROPLASTY Left 2015    Current Medications: Outpatient Medications Prior to  Visit  Medication Sig Dispense Refill  . ACCU-CHEK FASTCLIX LANCETS MISC 1 each by Does not apply route daily. Use as directed to check blood sugar once daily.  Dx code:  E11.65 100 each 0  . albuterol (PROVENTIL) (2.5 MG/3ML) 0.083% nebulizer solution Take 3 mLs (2.5 mg total) by nebulization every 6 (six) hours as needed for wheezing or shortness of breath. 75 mL 1  . Alcohol Swabs (B-D SINGLE USE SWABS REGULAR) PADS 1 each by Does not apply route daily. Use as directed to check blood sugar once daily.  Dx code:  E11.65 100 each 0  . aspirin EC 81 MG tablet Take 81 mg by mouth daily.    . Blood Glucose Monitoring Suppl (ACCU-CHEK AVIVA PLUS) w/Device KIT 1 each by Does not apply route daily. Use as directed to check blood sugar once daily.  Dx code:  E11.65 1 kit 0  . candesartan (ATACAND) 16 MG tablet Take 0.5 tablets (8 mg total) by mouth daily. 30 tablet 6  . carboxymethylcellulose (REFRESH PLUS) 0.5 % SOLN Place 1 drop into both eyes 2 (two) times daily.    . cetirizine (ZYRTEC) 10 MG tablet Take 10 mg by mouth daily.    . chlorhexidine (HIBICLENS) 4 % external liquid Apply topically daily as needed. 473 mL 0  . Cholecalciferol (VITAMIN D) 2000 units CAPS Take 1 capsule (2,000 Units total) by mouth daily. 30 capsule   . donepezil (ARICEPT) 5 MG tablet TAKE ONE TABLET BY MOUTH AT BEDTIME 30 tablet 3  . doxycycline (VIBRA-TABS) 100 MG tablet Take 1 tablet (100 mg total) by mouth 2 (two) times daily. 20 tablet 0  . furosemide (LASIX) 80 MG tablet Take 1 tablet by mouth daily 90 tablet 1  . glucose blood (ACCU-CHEK AVIVA PLUS) test strip 1 each by Other route as needed for other. Use as instructed to check blood sugar once daily.  Dx code:  E11.65 100 each 0  . LORazepam (ATIVAN) 0.5 MG tablet TAKE 1 TABLET BY MOUTH AT BEDTIME AS NEEDED FOR ANXIETY OR SLEEP MAY TAKE SECOND 1/2 TABLET DOSE AS NEEDED 45 tablet 0  . metFORMIN (GLUCOPHAGE XR) 500 MG 24 hr tablet Take 1 tablet (500 mg total) by mouth  daily after supper. 30 tablet 3  . morphine (MS CONTIN) 15 MG 12 hr tablet Take 1 tablet by mouth 2 (two) times daily.    . potassium chloride SA (K-DUR,KLOR-CON) 20 MEQ tablet Take 2 tablets (40 mEq total) by mouth daily. 180 tablet 1   No facility-administered medications prior to visit.      Allergies:   Codeine; Penicillin g; Penicillins; Prednisolone acetate; Meloxicam; Nitroglycerin; Crestor [rosuvastatin calcium]; Lopressor [metoprolol tartrate]; Prednisone; Sulfamethoxazole; and Zetia [ezetimibe]   Social History   Socioeconomic History  . Marital status: Widowed    Spouse name: Not on file  . Number of children: Not on file  . Years of education: Not on file  . Highest education level: Not on file  Occupational History  . Not on file  Social Needs  .  Financial resource strain: Not on file  . Food insecurity:    Worry: Not on file    Inability: Not on file  . Transportation needs:    Medical: Not on file    Non-medical: Not on file  Tobacco Use  . Smoking status: Former Smoker    Packs/day: 0.10    Years: 20.00    Pack years: 2.00    Types: Cigarettes    Last attempt to quit: 09/14/2017    Years since quitting: 0.9  . Smokeless tobacco: Never Used  . Tobacco comment: smoking cessation info given  Substance and Sexual Activity  . Alcohol use: No  . Drug use: No  . Sexual activity: Never  Lifestyle  . Physical activity:    Days per week: Not on file    Minutes per session: Not on file  . Stress: Not on file  Relationships  . Social connections:    Talks on phone: Not on file    Gets together: Not on file    Attends religious service: Not on file    Active member of club or organization: Not on file    Attends meetings of clubs or organizations: Not on file    Relationship status: Not on file  Other Topics Concern  . Not on file  Social History Narrative   Widow   Lives with daughter and daughter's GF   Edu: HS   Occ: retired, worked at [employer]   Activity:  no regular exercise   Diet: good water      Family History:  The patient's family history includes Breast cancer in her maternal aunt; Colon polyps in her maternal grandfather; Coronary artery disease in her father; Dementia (age of onset: 13) in her mother; Diabetes in her brother; Hypertension in her brother and father; Hypotension in her mother and sister; Lung cancer in her daughter; Stroke in her mother; Thyroid cancer in her daughter.   ROS:   Please see the history of present illness.    ROS all other systems are reviewed and are negative, with assistance from the patient's family PHYSICAL EXAM:   VS:  BP 120/72   Pulse 68   Ht 5' (1.524 m)   Wt 145 lb 6.4 oz (66 kg)   SpO2 97%   BMI 28.40 kg/m       General: Alert, oriented x3, no distress, poor memory, mildly overweight Head: no evidence of trauma, PERRL, EOMI, no exophtalmos or lid lag, no myxedema, no xanthelasma; normal ears, nose and oropharynx Neck: normal jugular venous pulsations and no hepatojugular reflux; brisk carotid pulses without delay and no carotid bruits Chest: clear to auscultation, no signs of consolidation by percussion or palpation, normal fremitus, symmetrical and full respiratory excursions Cardiovascular: normal position and quality of the apical impulse, regular rhythm, normal first and second heart sounds, no murmurs, rubs or gallops Abdomen: no tenderness or distention, no masses by palpation, no abnormal pulsatility or arterial bruits, normal bowel sounds, no hepatosplenomegaly Extremities: no clubbing, cyanosis; trivial symmetrical ankle edema; 2+ radial, ulnar and brachial pulses bilaterally; 2+ right femoral, posterior tibial and dorsalis pedis pulses; 2+ left femoral, posterior tibial and dorsalis pedis pulses; no subclavian or femoral bruits Neurological: grossly nonfocal Psych: Normal mood and affect    Wt Readings from Last 3 Encounters:  08/18/18 145 lb 6.4 oz (66 kg)  08/17/18 144 lb 8  oz (65.5 kg)  07/27/18 143 lb 8 oz (65.1 kg)      Studies/Labs Reviewed:  ECHO 09/01/2017: - Left ventricle: The cavity size was normal. There was mild   concentric hypertrophy. Systolic function was normal. The   estimated ejection fraction was in the range of 55% to 60%. Wall   motion was normal; there were no regional wall motion   abnormalities. Doppler parameters are consistent with abnormal   left ventricular relaxation (grade 1 diastolic dysfunction). - Aortic valve: Trileaflet; moderately thickened, severely   calcified leaflets. Transvalvular velocity was within the normal   range. There was no stenosis. E/e' was 14.68  EKG:  EKG is not ordered today.   Recent Labs: 12/10/2017: B Natriuretic Peptide 76.8 04/18/2018: NT-Pro BNP 235 04/19/2018: ALT 22; Hemoglobin 12.4; Platelets 319; TSH 0.463 05/08/2018: Magnesium 1.9 07/24/2018: BUN 26; Creatinine, Ser 0.82; Potassium 5.1; Sodium 134   Lipid Panel    Component Value Date/Time   CHOL 184 04/07/2018 0841   TRIG 286.0 (H) 04/07/2018 0841   HDL 36.40 (L) 04/07/2018 0841   CHOLHDL 5 04/07/2018 0841   VLDL 57.2 (H) 04/07/2018 0841   LDLCALC 123 02/06/2016   LDLDIRECT 122.0 04/07/2018 0841    ASSESSMENT:    1. Chronic diastolic (congestive) heart failure ([place])   2. Hyponatremia   3. Essential hypertension   4. History of renal artery stenosis   5. Coronary artery disease involving coronary bypass graft of native heart without angina pectoris   6. Hypercholesterolemia   7. Diabetes mellitus type 2 in nonobese (HCC)   8. Atherosclerosis of aorta (HCC)      PLAN:  In order of problems listed above:  1. CHF: She is clinically euvolemic and appears to have NYHA functional class II (hard to assess due to sedentary status).  Her dry weight seems to be 140-145 pounds and she has proven to have a rather narrow margin between hypovolemia (with renal insufficiency and hyponatremia) and hypervolemia (with orthopnea and  PND).  It is possible that she has cardiac amyloidosis, but I think her neurological problems limit the benefit she would get from aggressive work-up and treatment for this disorder.  Continue the current dose of loop diuretic 2. Hyponatremia: Resolved 3. HTN: Controlled.  History of intolerance to beta-blockers 4. History of left renal artery stenosis: No stenosis over 60% on studies from 2016 and 2018 5. CAD: Asymptomatic.  Most recent cardiac catheterization with low risk anatomy. 6. HLP: Intolerant to statins despite several tries.  Red yeast rice did not make a difference.  Because of her cognitive deficits and impaired quality of life I do not think PCSK9 inhibitors will make a big difference. 7. DM: Hyperglycemia is contributing to the problems with volume maintenance/hyponatremia via obligatory diuresis. 8. Ao athero: Statin intolerant.  Medication Adjustments/Labs and Tests Ordered: Current medicines are reviewed at length with the patient today.  Concerns regarding medicines are outlined above.  Medication changes, Labs and Tests ordered today are listed in the Patient Instructions below. Patient Instructions  Medication Instructions:  Continue same medications If you need a refill on your cardiac medications before your next appointment, please call your pharmacy.   Lab work: None  ordered   Testing/Procedures: None ordered  Follow-Up: At [facility], you and your health needs are our priority.  As part of our continuing mission to provide you with exceptional heart care, we have created designated Provider Care Teams.  These Care Teams include your primary Cardiologist (physician) and Advanced Practice Providers (APPs -  Physician Assistants and Nurse Practitioners) who all work together to provide you with  the care you need, when you need it. . Schedule follow up appointment with Dr.Lavoy Bernards  3 to 6 months  Call 3 months before to schedule      Signed, Sanda Klein,  MD  08/19/2018 11:36 AM    [address], [place], [place]  [address] Phone: [phone]; Fax: [phone]

## 2018-08-18 NOTE — Patient Instructions (Signed)
Medication Instructions:  Continue same medications If you need a refill on your cardiac medications before your next appointment, please call your pharmacy.   Lab work: None  ordered   Testing/Procedures: None ordered  Follow-Up: At [facility], you and your health needs are our priority.  As part of our continuing mission to provide you with exceptional heart care, we have created designated Provider Care Teams.  These Care Teams include your primary Cardiologist (physician) and Advanced Practice Providers (APPs -  Physician Assistants and Nurse Practitioners) who all work together to provide you with the care you need, when you need it. . Schedule follow up appointment with Dr.Croitoru  3 to 6 months  Call 3 months before to schedule

## 2018-08-19 DIAGNOSIS — E7800 Pure hypercholesterolemia, unspecified: Secondary | ICD-10-CM

## 2018-08-19 DIAGNOSIS — E119 Type 2 diabetes mellitus without complications: Secondary | ICD-10-CM

## 2018-08-19 DIAGNOSIS — I25810 Atherosclerosis of coronary artery bypass graft(s) without angina pectoris: Secondary | ICD-10-CM | POA: Insufficient documentation

## 2018-08-31 ENCOUNTER — Ambulatory Visit: Payer: Medicare HMO | Admitting: Family Medicine

## 2018-09-01 ENCOUNTER — Other Ambulatory Visit: Payer: Self-pay

## 2018-09-01 ENCOUNTER — Other Ambulatory Visit: Payer: Medicare HMO

## 2018-09-01 ENCOUNTER — Telehealth (INDEPENDENT_AMBULATORY_CARE_PROVIDER_SITE_OTHER): Payer: Medicare HMO

## 2018-09-01 ENCOUNTER — Ambulatory Visit: Payer: Medicare HMO | Admitting: Family Medicine

## 2018-09-01 DIAGNOSIS — R300 Dysuria: Secondary | ICD-10-CM

## 2018-09-01 LAB — POC URINALSYSI DIPSTICK (AUTOMATED)
Bilirubin, UA: NEGATIVE
Blood, UA: NEGATIVE
GLUCOSE UA: POSITIVE — AB
Ketones, UA: NEGATIVE
Nitrite, UA: POSITIVE
Protein, UA: NEGATIVE
SPEC GRAV UA: 1.015 (ref 1.010–1.025)
Urobilinogen, UA: 1 E.U./dL
pH, UA: 6 (ref 5.0–8.0)

## 2018-09-01 MED ORDER — NITROFURANTOIN MONOHYD MACRO 100 MG PO CAPS: 100.0000 mg | ORAL_CAPSULE | Freq: Two times a day (BID) | ORAL | 0 refills | Status: DC

## 2018-09-01 NOTE — Addendum Note (Signed)
Addended by: Ellamae Sia on: 09/01/2018 03:29 PM   Modules accepted: Orders

## 2018-09-01 NOTE — Telephone Encounter (Addendum)
Patient daughter advised

## 2018-09-01 NOTE — Telephone Encounter (Signed)
Meredith Gutierrez (DPR signed) said starting on 08/31/18 pt developed pressure, frequency and burning upon urination;no fever.Meredith Gutierrez gave pt an Azo which helped the pressure feeling.pt finished abx given for recurrent boils first of this week. Pt lives in [place] and cannot come in to office this morning and Meredith Gutierrez does not want to bring pt out. Dr Darnell Level said if can boil canning jar, and pt clean front to back with wipe can bring in specimen. Meredith Gutierrez will start to get jar and lid boiled and will bring specimen to [facility] [er Dr Darnell Level. FYI to Dr Baldwin Crown CMA. [facility].

## 2018-09-01 NOTE — Telephone Encounter (Signed)
Noted. Thanks. Will want UA/culture.  recent doxy course for boils.

## 2018-09-01 NOTE — Telephone Encounter (Signed)
Pt's daughter dropped off pt's urine sample.  Ran UA and entered results.

## 2018-09-01 NOTE — Addendum Note (Signed)
Addended by: Ellamae Sia on: 09/01/2018 03:28 PM   Modules accepted: Orders

## 2018-09-01 NOTE — Telephone Encounter (Signed)
See my message below 

## 2018-09-01 NOTE — Addendum Note (Signed)
Addended by: Ria Bush on: 09/01/2018 03:24 PM   Modules accepted: Orders

## 2018-09-01 NOTE — Telephone Encounter (Addendum)
Plz notify urine microscopy wasn't too suspicious for infection however I do recommend she start abx macrobid 7d course sent to pharmacy. If culture returns normal, may call to stop.  Culture ordered.

## 2018-09-02 LAB — URINE CULTURE
MICRO NUMBER:: 341100
SPECIMEN QUALITY:: ADEQUATE

## 2018-09-08 ENCOUNTER — Other Ambulatory Visit: Payer: Self-pay | Admitting: Family Medicine

## 2018-09-08 NOTE — Telephone Encounter (Signed)
Electronic refill request. Lorazepam Last office visit:   08/17/2018 DM Last Filled:    45 tablet 0 08/11/2018  Please advise.

## 2018-09-10 NOTE — Telephone Encounter (Signed)
Eprescribed.

## 2018-09-21 DIAGNOSIS — M47816 Spondylosis without myelopathy or radiculopathy, lumbar region: Secondary | ICD-10-CM | POA: Diagnosis not present

## 2018-09-21 DIAGNOSIS — Z6826 Body mass index (BMI) 26.0-26.9, adult: Secondary | ICD-10-CM | POA: Diagnosis not present

## 2018-10-09 ENCOUNTER — Other Ambulatory Visit: Payer: Self-pay | Admitting: Family Medicine

## 2018-10-09 NOTE — Telephone Encounter (Signed)
Name of Medication: Lorazepam Name of Pharmacy: [facility] or Written Date and Quantity: 09/11/18, #45 Last Office Visit and Type: 08/17/18, f/u Next Office Visit and Type: 04/30/19, CPE Pt 2 Last Controlled Substance Agreement Date: none Last UDS: none

## 2018-10-10 NOTE — Telephone Encounter (Signed)
Eprescribed.

## 2018-11-14 ENCOUNTER — Telehealth: Payer: Self-pay | Admitting: *Deleted

## 2018-11-14 MED ORDER — DOXYCYCLINE HYCLATE 100 MG PO TABS: 100.0000 mg | ORAL_TABLET | Freq: Two times a day (BID) | ORAL | 0 refills | Status: DC

## 2018-11-14 NOTE — Telephone Encounter (Signed)
Patient's daughter [name] called stating that patient is having a recurrence of boils on her buttocks. [name] stated that she was treated back in March for the same problem. Patient's daughter stated that they started back about 5 days ago and are looking pretty bad.  [name] stated that patient is not running a fever and Dr. Danise Mina has treated these previously with an oral antibiotic which cleared them up pretty quickly. [name] stated that she thinks the name of the medication was Doxycycline. Pharmacy [facility]

## 2018-11-14 NOTE — Telephone Encounter (Signed)
I've refilled doxy for her. Did chlorhexadine wash help?  Can we schedule virtual visit for tomorrow to check on her?

## 2018-11-15 NOTE — Telephone Encounter (Signed)
Spoke with pt's daughter, [name] (on dpr), relaying Dr. Synthia Innocent message.  Verbalizes understanding and says she does not remember them picking up the wash.  So pt has not used it.  Declines to schedule visit at this time but will if pt does not improve. Fyi to Dr. Darnell Level.

## 2018-12-11 ENCOUNTER — Other Ambulatory Visit: Payer: Self-pay | Admitting: Family Medicine

## 2018-12-11 NOTE — Telephone Encounter (Signed)
Name of Medication: Lorazepam Name of Pharmacy: [facility] or [facility] Date and Quantity: 11/07/18, #45 Last Office Visit and Type: 08/17/18, f/u Next Office Visit and Type: 04/30/19, CPE Pt 2 Last Controlled Substance Agreement Date: none Last UDS: none

## 2018-12-14 NOTE — Telephone Encounter (Signed)
Eprescribed.

## 2018-12-18 ENCOUNTER — Telehealth: Payer: Self-pay | Admitting: Cardiovascular Disease

## 2018-12-18 DIAGNOSIS — E871 Hypo-osmolality and hyponatremia: Secondary | ICD-10-CM

## 2018-12-18 DIAGNOSIS — R5383 Other fatigue: Secondary | ICD-10-CM

## 2018-12-18 DIAGNOSIS — I5032 Chronic diastolic (congestive) heart failure: Secondary | ICD-10-CM

## 2018-12-18 NOTE — Telephone Encounter (Signed)
Daughter of the patient called. She wanted to know if the office could draw some labs on the patient tomorrow. The patient has been sleeping much more than normal and the daughter wants to make sure that the patient's potassium levels are accurate.    Pt c/o BP issue: STAT if pt c/o blurred vision, one-sided weakness or slurred speech  1. What are your last 5 BP readings?  115/74 117/68 130/76 125/60 115/69  2. Are you having any other symptoms (ex. Dizziness, headache, blurred vision, passed out)?  Just sleeping more   3. What is your BP issue? The daughter also noticed that the patient's bp seems low, and wants to know if someone in the office can take it. The daughter is worried that her cuff may not be accurate

## 2018-12-18 NOTE — Telephone Encounter (Signed)
Please order CMET, TSH, CBC (CHF, fatigue, hyponatremia) and can check a BP when she comes in for labs. Then can decide whether she needs an appt

## 2018-12-18 NOTE — Telephone Encounter (Signed)
lmtcb

## 2018-12-18 NOTE — Telephone Encounter (Addendum)
  Spoke with pt daughter who states pt has been sleeping a lot lately and it has been hard to wake her up. She states pt has no other symptoms except for sleepiness. She states pt has not had any recent medication changes and that pt does have decreased appetite. She states pt able to move around her home okay. Per daughter, pt most recent BP was yesterday and 126/86. She states pt BP runs 130s/70-78. She states that she would like pt to come in for lab work to evaluate potassium level and would like pt to come in for BP check as well to make sure pt BP readings at home are similar to office readings. Reviewed pt current med list. She states pt not taking Aricept, Zyrtec, or Vibra-tabs. Informed pt daughter that message would be routed to Dr. Loletha Grayer to see if he wanted to order any lab work or have pt come in for OV with him or APP

## 2018-12-19 DIAGNOSIS — Z961 Presence of intraocular lens: Secondary | ICD-10-CM | POA: Diagnosis not present

## 2018-12-19 DIAGNOSIS — H5202 Hypermetropia, left eye: Secondary | ICD-10-CM | POA: Diagnosis not present

## 2018-12-19 DIAGNOSIS — H04123 Dry eye syndrome of bilateral lacrimal glands: Secondary | ICD-10-CM | POA: Diagnosis not present

## 2018-12-19 DIAGNOSIS — H02005 Unspecified entropion of left lower eyelid: Secondary | ICD-10-CM | POA: Diagnosis not present

## 2018-12-19 DIAGNOSIS — M47816 Spondylosis without myelopathy or radiculopathy, lumbar region: Secondary | ICD-10-CM | POA: Diagnosis not present

## 2018-12-19 NOTE — Telephone Encounter (Signed)
The patient's daughter called back for the instructions. She has verbalized her understanding. An appointment for a blood pressure check has been made for 12/22/2018 at 12 noon

## 2018-12-19 NOTE — Addendum Note (Signed)
Addended by: Ricci Barker on: 12/19/2018 04:28 PM   Modules accepted: Orders

## 2018-12-19 NOTE — Telephone Encounter (Signed)
Left a message for the patient to call back.  

## 2018-12-20 ENCOUNTER — Emergency Department (HOSPITAL_COMMUNITY): Payer: Medicare HMO

## 2018-12-20 ENCOUNTER — Telehealth: Payer: Self-pay | Admitting: *Deleted

## 2018-12-20 ENCOUNTER — Other Ambulatory Visit: Payer: Self-pay

## 2018-12-20 ENCOUNTER — Inpatient Hospital Stay (HOSPITAL_COMMUNITY)
Admission: EM | Admit: 2018-12-20 | Discharge: 2018-12-23 | DRG: 871 | Disposition: A | Discharge status: Home / Self Care | Payer: Medicare HMO | Attending: Family Medicine | Admitting: Family Medicine

## 2018-12-20 ENCOUNTER — Encounter (HOSPITAL_COMMUNITY): Payer: Self-pay

## 2018-12-20 DIAGNOSIS — K5900 Constipation, unspecified: Secondary | ICD-10-CM | POA: Diagnosis present

## 2018-12-20 DIAGNOSIS — F0390 Unspecified dementia without behavioral disturbance: Secondary | ICD-10-CM | POA: Diagnosis present

## 2018-12-20 DIAGNOSIS — F0391 Unspecified dementia with behavioral disturbance: Secondary | ICD-10-CM | POA: Diagnosis present

## 2018-12-20 DIAGNOSIS — I5032 Chronic diastolic (congestive) heart failure: Secondary | ICD-10-CM

## 2018-12-20 DIAGNOSIS — J440 Chronic obstructive pulmonary disease with acute lower respiratory infection: Secondary | ICD-10-CM | POA: Diagnosis not present

## 2018-12-20 DIAGNOSIS — Z823 Family history of stroke: Secondary | ICD-10-CM

## 2018-12-20 DIAGNOSIS — J181 Lobar pneumonia, unspecified organism: Secondary | ICD-10-CM

## 2018-12-20 DIAGNOSIS — Z951 Presence of aortocoronary bypass graft: Secondary | ICD-10-CM | POA: Diagnosis not present

## 2018-12-20 DIAGNOSIS — Z885 Allergy status to narcotic agent status: Secondary | ICD-10-CM | POA: Diagnosis not present

## 2018-12-20 DIAGNOSIS — I77810 Thoracic aortic ectasia: Secondary | ICD-10-CM | POA: Diagnosis present

## 2018-12-20 DIAGNOSIS — E119 Type 2 diabetes mellitus without complications: Secondary | ICD-10-CM

## 2018-12-20 DIAGNOSIS — Z808 Family history of malignant neoplasm of other organs or systems: Secondary | ICD-10-CM

## 2018-12-20 DIAGNOSIS — I080 Rheumatic disorders of both mitral and aortic valves: Secondary | ICD-10-CM | POA: Diagnosis present

## 2018-12-20 DIAGNOSIS — I2510 Atherosclerotic heart disease of native coronary artery without angina pectoris: Secondary | ICD-10-CM | POA: Diagnosis present

## 2018-12-20 DIAGNOSIS — I10 Essential (primary) hypertension: Secondary | ICD-10-CM

## 2018-12-20 DIAGNOSIS — Z9842 Cataract extraction status, left eye: Secondary | ICD-10-CM

## 2018-12-20 DIAGNOSIS — Z8249 Family history of ischemic heart disease and other diseases of the circulatory system: Secondary | ICD-10-CM

## 2018-12-20 DIAGNOSIS — I110 Hypertensive heart disease with heart failure: Secondary | ICD-10-CM | POA: Diagnosis not present

## 2018-12-20 DIAGNOSIS — Z8371 Family history of colonic polyps: Secondary | ICD-10-CM

## 2018-12-20 DIAGNOSIS — E785 Hyperlipidemia, unspecified: Secondary | ICD-10-CM | POA: Diagnosis present

## 2018-12-20 DIAGNOSIS — Z96642 Presence of left artificial hip joint: Secondary | ICD-10-CM | POA: Diagnosis present

## 2018-12-20 DIAGNOSIS — R58 Hemorrhage, not elsewhere classified: Secondary | ICD-10-CM | POA: Diagnosis not present

## 2018-12-20 DIAGNOSIS — G8929 Other chronic pain: Secondary | ICD-10-CM | POA: Diagnosis present

## 2018-12-20 DIAGNOSIS — J449 Chronic obstructive pulmonary disease, unspecified: Secondary | ICD-10-CM | POA: Diagnosis not present

## 2018-12-20 DIAGNOSIS — Z23 Encounter for immunization: Secondary | ICD-10-CM

## 2018-12-20 DIAGNOSIS — R4020 Unspecified coma: Secondary | ICD-10-CM | POA: Diagnosis not present

## 2018-12-20 DIAGNOSIS — Z90710 Acquired absence of both cervix and uterus: Secondary | ICD-10-CM

## 2018-12-20 DIAGNOSIS — M549 Dorsalgia, unspecified: Secondary | ICD-10-CM | POA: Diagnosis present

## 2018-12-20 DIAGNOSIS — Z9841 Cataract extraction status, right eye: Secondary | ICD-10-CM

## 2018-12-20 DIAGNOSIS — R7881 Bacteremia: Secondary | ICD-10-CM | POA: Diagnosis not present

## 2018-12-20 DIAGNOSIS — G9341 Metabolic encephalopathy: Secondary | ICD-10-CM | POA: Diagnosis present

## 2018-12-20 DIAGNOSIS — A401 Sepsis due to streptococcus, group B: Principal | ICD-10-CM | POA: Diagnosis present

## 2018-12-20 DIAGNOSIS — Z91048 Other nonmedicinal substance allergy status: Secondary | ICD-10-CM | POA: Diagnosis not present

## 2018-12-20 DIAGNOSIS — B951 Streptococcus, group B, as the cause of diseases classified elsewhere: Secondary | ICD-10-CM | POA: Diagnosis not present

## 2018-12-20 DIAGNOSIS — F05 Delirium due to known physiological condition: Secondary | ICD-10-CM | POA: Diagnosis not present

## 2018-12-20 DIAGNOSIS — J153 Pneumonia due to streptococcus, group B: Secondary | ICD-10-CM | POA: Diagnosis present

## 2018-12-20 DIAGNOSIS — Z85820 Personal history of malignant melanoma of skin: Secondary | ICD-10-CM

## 2018-12-20 DIAGNOSIS — Z886 Allergy status to analgesic agent status: Secondary | ICD-10-CM | POA: Diagnosis not present

## 2018-12-20 DIAGNOSIS — Z801 Family history of malignant neoplasm of trachea, bronchus and lung: Secondary | ICD-10-CM

## 2018-12-20 DIAGNOSIS — R160 Hepatomegaly, not elsewhere classified: Secondary | ICD-10-CM | POA: Diagnosis present

## 2018-12-20 DIAGNOSIS — J189 Pneumonia, unspecified organism: Secondary | ICD-10-CM | POA: Diagnosis not present

## 2018-12-20 DIAGNOSIS — Z833 Family history of diabetes mellitus: Secondary | ICD-10-CM

## 2018-12-20 DIAGNOSIS — D3502 Benign neoplasm of left adrenal gland: Secondary | ICD-10-CM | POA: Diagnosis present

## 2018-12-20 DIAGNOSIS — B955 Unspecified streptococcus as the cause of diseases classified elsewhere: Secondary | ICD-10-CM | POA: Diagnosis not present

## 2018-12-20 DIAGNOSIS — Z87892 Personal history of anaphylaxis: Secondary | ICD-10-CM

## 2018-12-20 DIAGNOSIS — Z79899 Other long term (current) drug therapy: Secondary | ICD-10-CM

## 2018-12-20 DIAGNOSIS — L0293 Carbuncle, unspecified: Secondary | ICD-10-CM | POA: Diagnosis present

## 2018-12-20 DIAGNOSIS — Z803 Family history of malignant neoplasm of breast: Secondary | ICD-10-CM

## 2018-12-20 DIAGNOSIS — J90 Pleural effusion, not elsewhere classified: Secondary | ICD-10-CM | POA: Diagnosis not present

## 2018-12-20 DIAGNOSIS — R109 Unspecified abdominal pain: Secondary | ICD-10-CM | POA: Diagnosis not present

## 2018-12-20 DIAGNOSIS — Z7984 Long term (current) use of oral hypoglycemic drugs: Secondary | ICD-10-CM

## 2018-12-20 DIAGNOSIS — R918 Other nonspecific abnormal finding of lung field: Secondary | ICD-10-CM | POA: Diagnosis not present

## 2018-12-20 DIAGNOSIS — Z87891 Personal history of nicotine dependence: Secondary | ICD-10-CM | POA: Diagnosis not present

## 2018-12-20 DIAGNOSIS — F0150 Vascular dementia without behavioral disturbance: Secondary | ICD-10-CM | POA: Diagnosis not present

## 2018-12-20 DIAGNOSIS — G9340 Encephalopathy, unspecified: Secondary | ICD-10-CM | POA: Diagnosis not present

## 2018-12-20 DIAGNOSIS — Z888 Allergy status to other drugs, medicaments and biological substances status: Secondary | ICD-10-CM | POA: Diagnosis not present

## 2018-12-20 DIAGNOSIS — Z20828 Contact with and (suspected) exposure to other viral communicable diseases: Secondary | ICD-10-CM | POA: Diagnosis not present

## 2018-12-20 DIAGNOSIS — E0590 Thyrotoxicosis, unspecified without thyrotoxic crisis or storm: Secondary | ICD-10-CM | POA: Diagnosis present

## 2018-12-20 DIAGNOSIS — Z872 Personal history of diseases of the skin and subcutaneous tissue: Secondary | ICD-10-CM | POA: Diagnosis not present

## 2018-12-20 DIAGNOSIS — I672 Cerebral atherosclerosis: Secondary | ICD-10-CM | POA: Diagnosis present

## 2018-12-20 DIAGNOSIS — E278 Other specified disorders of adrenal gland: Secondary | ICD-10-CM | POA: Diagnosis not present

## 2018-12-20 DIAGNOSIS — F03918 Unspecified dementia, unspecified severity, with other behavioral disturbance: Secondary | ICD-10-CM | POA: Diagnosis present

## 2018-12-20 DIAGNOSIS — Z880 Allergy status to penicillin: Secondary | ICD-10-CM | POA: Diagnosis not present

## 2018-12-20 DIAGNOSIS — I25810 Atherosclerosis of coronary artery bypass graft(s) without angina pectoris: Secondary | ICD-10-CM | POA: Diagnosis present

## 2018-12-20 DIAGNOSIS — Z881 Allergy status to other antibiotic agents status: Secondary | ICD-10-CM | POA: Diagnosis not present

## 2018-12-20 DIAGNOSIS — E7800 Pure hypercholesterolemia, unspecified: Secondary | ICD-10-CM | POA: Diagnosis present

## 2018-12-20 DIAGNOSIS — Z87442 Personal history of urinary calculi: Secondary | ICD-10-CM

## 2018-12-20 DIAGNOSIS — R4182 Altered mental status, unspecified: Secondary | ICD-10-CM | POA: Diagnosis not present

## 2018-12-20 HISTORY — DX: Pneumonia, unspecified organism: J18.9

## 2018-12-20 LAB — URINALYSIS, ROUTINE W REFLEX MICROSCOPIC
Bacteria, UA: NONE SEEN
Bilirubin Urine: NEGATIVE
Glucose, UA: >=500 mg/dL — AB
Hgb urine dipstick: NEGATIVE
Ketones, ur: NEGATIVE mg/dL
Leukocytes,Ua: NEGATIVE
Nitrite: NEGATIVE
Protein, ur: NEGATIVE mg/dL
Specific Gravity, Urine: 1.011 (ref 1.005–1.030)
pH: 6 (ref 5.0–8.0)

## 2018-12-20 LAB — CBG MONITORING, ED: Glucose-Capillary: 262 mg/dL — ABNORMAL HIGH (ref 70–99)

## 2018-12-20 LAB — COMPREHENSIVE METABOLIC PANEL
ALT: 16 U/L (ref 0–44)
AST: 19 U/L (ref 15–41)
Albumin: 3.9 g/dL (ref 3.5–5.0)
Alkaline Phosphatase: 91 U/L (ref 38–126)
Anion gap: 15 (ref 5–15)
BUN: 16 mg/dL (ref 8–23)
CO2: 21 mmol/L — ABNORMAL LOW (ref 22–32)
Calcium: 9.2 mg/dL (ref 8.9–10.3)
Chloride: 94 mmol/L — ABNORMAL LOW (ref 98–111)
Creatinine, Ser: 0.95 mg/dL (ref 0.44–1.00)
GFR calc Af Amer: >60 mL/min (ref >60)
GFR calc non Af Amer: 57 mL/min — ABNORMAL LOW (ref >60)
Glucose, Bld: 264 mg/dL — ABNORMAL HIGH (ref 70–99)
Potassium: 4 mmol/L (ref 3.5–5.1)
Sodium: 130 mmol/L — ABNORMAL LOW (ref 135–145)
Total Bilirubin: 0.7 mg/dL (ref 0.3–1.2)
Total Protein: 7.1 g/dL (ref 6.5–8.1)

## 2018-12-20 LAB — POCT I-STAT EG7
Acid-Base Excess: 2 mmol/L (ref 0.0–2.0)
Bicarbonate: 25.5 mmol/L (ref 20.0–28.0)
Calcium, Ion: 1.11 mmol/L — ABNORMAL LOW (ref 1.15–1.40)
HCT: 42 % (ref 36.0–46.0)
Hemoglobin: 14.3 g/dL (ref 12.0–15.0)
O2 Saturation: 37 %
Potassium: 4.2 mmol/L (ref 3.5–5.1)
Sodium: 131 mmol/L — ABNORMAL LOW (ref 135–145)
TCO2: 27 mmol/L (ref 22–32)
pCO2, Ven: 35 mmHg — ABNORMAL LOW (ref 44.0–60.0)
pH, Ven: 7.471 — ABNORMAL HIGH (ref 7.250–7.430)
pO2, Ven: 20 mmHg — CL (ref 32.0–45.0)

## 2018-12-20 LAB — CBC WITH DIFFERENTIAL/PLATELET
Abs Immature Granulocytes: 0.1 10*3/uL — ABNORMAL HIGH (ref 0.00–0.07)
Basophils Absolute: 0 10*3/uL (ref 0.0–0.1)
Basophils Relative: 0 %
Eosinophils Absolute: 0 10*3/uL (ref 0.0–0.5)
Eosinophils Relative: 0 %
HCT: 39.6 % (ref 36.0–46.0)
Hemoglobin: 13.2 g/dL (ref 12.0–15.0)
Immature Granulocytes: 1 %
Lymphocytes Relative: 3 %
Lymphs Abs: 0.5 10*3/uL — ABNORMAL LOW (ref 0.7–4.0)
MCH: 29.7 pg (ref 26.0–34.0)
MCHC: 33.3 g/dL (ref 30.0–36.0)
MCV: 89 fL (ref 80.0–100.0)
Monocytes Absolute: 0.9 10*3/uL (ref 0.1–1.0)
Monocytes Relative: 5 %
Neutro Abs: 16.3 10*3/uL — ABNORMAL HIGH (ref 1.7–7.7)
Neutrophils Relative %: 91 %
Platelets: 182 10*3/uL (ref 150–400)
RBC: 4.45 MIL/uL (ref 3.87–5.11)
RDW: 13.4 % (ref 11.5–15.5)
WBC: 17.8 10*3/uL — ABNORMAL HIGH (ref 4.0–10.5)
nRBC: 0 % (ref 0.0–0.2)

## 2018-12-20 LAB — LACTIC ACID, PLASMA
Lactic Acid, Venous: 2.8 mmol/L (ref 0.5–1.9)
Lactic Acid, Venous: 2.6 mmol/L (ref 0.5–1.9)

## 2018-12-20 LAB — SARS CORONAVIRUS 2 BY RT PCR (HOSPITAL ORDER, PERFORMED IN ~~LOC~~ HOSPITAL LAB): SARS Coronavirus 2: NEGATIVE

## 2018-12-20 LAB — AMMONIA: Ammonia: 16 umol/L (ref 9–35)

## 2018-12-20 LAB — TSH: TSH: 0.534 u[IU]/mL (ref 0.350–4.500)

## 2018-12-20 LAB — ETHANOL: Alcohol, Ethyl (B): <10 mg/dL (ref <10)

## 2018-12-20 MED ORDER — SENNOSIDES-DOCUSATE SODIUM 8.6-50 MG PO TABS
1.0000 | ORAL_TABLET | Freq: Two times a day (BID) | ORAL | Status: DC
  Administered 2018-12-21 – 2018-12-22 (×4): 1 via ORAL
  Filled 2018-12-20 (×5): qty 1

## 2018-12-20 MED ORDER — DIPHENHYDRAMINE HCL 50 MG/ML IJ SOLN
12.5000 mg | Freq: Once | INTRAMUSCULAR | Status: AC
  Administered 2018-12-20: 12.5 mg via INTRAVENOUS
  Filled 2018-12-20: qty 1

## 2018-12-20 MED ORDER — POLYVINYL ALCOHOL 1.4 % OP SOLN
1.0000 [drp] | Freq: Two times a day (BID) | OPHTHALMIC | Status: DC | PRN
  Filled 2018-12-20: qty 15

## 2018-12-20 MED ORDER — BISACODYL 5 MG PO TBEC: 5.0000 mg | DELAYED_RELEASE_TABLET | Freq: Every day | ORAL | Status: DC | PRN

## 2018-12-20 MED ORDER — INSULIN ASPART 100 UNIT/ML ~~LOC~~ SOLN
0.0000 [IU] | Freq: Three times a day (TID) | SUBCUTANEOUS | Status: DC
  Administered 2018-12-21: 1 [IU] via SUBCUTANEOUS
  Administered 2018-12-21: 08:00:00 2 [IU] via SUBCUTANEOUS
  Administered 2018-12-21: 12:00:00 1 [IU] via SUBCUTANEOUS
  Administered 2018-12-22: 17:00:00 3 [IU] via SUBCUTANEOUS
  Administered 2018-12-22 – 2018-12-23 (×3): 2 [IU] via SUBCUTANEOUS

## 2018-12-20 MED ORDER — SODIUM CHLORIDE 0.9 % IV SOLN
1.0000 g | Freq: Three times a day (TID) | INTRAVENOUS | Status: DC
  Filled 2018-12-20: qty 1

## 2018-12-20 MED ORDER — LORAZEPAM 2 MG/ML IJ SOLN
0.5000 mg | INTRAMUSCULAR | Status: DC | PRN
  Administered 2018-12-21 – 2018-12-23 (×2): 0.5 mg via INTRAVENOUS
  Filled 2018-12-20 (×2): qty 1

## 2018-12-20 MED ORDER — MORPHINE SULFATE 15 MG PO TABS
15.0000 mg | ORAL_TABLET | Freq: Four times a day (QID) | ORAL | Status: DC | PRN
  Administered 2018-12-21 – 2018-12-23 (×4): 15 mg via ORAL
  Filled 2018-12-20 (×6): qty 1

## 2018-12-20 MED ORDER — METRONIDAZOLE IN NACL 5-0.79 MG/ML-% IV SOLN
500.0000 mg | Freq: Once | INTRAVENOUS | Status: AC
  Administered 2018-12-20: 500 mg via INTRAVENOUS
  Filled 2018-12-20: qty 100

## 2018-12-20 MED ORDER — LORAZEPAM 0.5 MG PO TABS
0.5000 mg | ORAL_TABLET | ORAL | Status: DC
  Administered 2018-12-21: 1 mg via ORAL
  Administered 2018-12-22: 0.5 mg via ORAL
  Filled 2018-12-20: qty 2
  Filled 2018-12-20: qty 1

## 2018-12-20 MED ORDER — SODIUM CHLORIDE 0.9 % IV SOLN
INTRAVENOUS | Status: DC
  Administered 2018-12-21 – 2018-12-22 (×3): via INTRAVENOUS

## 2018-12-20 MED ORDER — IRBESARTAN 150 MG PO TABS
150.0000 mg | ORAL_TABLET | Freq: Every day | ORAL | Status: DC
  Administered 2018-12-21 – 2018-12-23 (×3): 150 mg via ORAL
  Filled 2018-12-20 (×3): qty 1

## 2018-12-20 MED ORDER — LORAZEPAM 2 MG/ML IJ SOLN
0.5000 mg | Freq: Once | INTRAMUSCULAR | Status: AC
  Administered 2018-12-20: 0.5 mg via INTRAVENOUS
  Filled 2018-12-20: qty 1

## 2018-12-20 MED ORDER — SODIUM CHLORIDE 0.9 % IV SOLN
2.0000 g | Freq: Once | INTRAVENOUS | Status: AC
  Administered 2018-12-20: 21:00:00 2 g via INTRAVENOUS
  Filled 2018-12-20: qty 2

## 2018-12-20 MED ORDER — ENOXAPARIN SODIUM 40 MG/0.4ML ~~LOC~~ SOLN
40.0000 mg | SUBCUTANEOUS | Status: DC
  Administered 2018-12-21 – 2018-12-22 (×2): 40 mg via SUBCUTANEOUS
  Filled 2018-12-20 (×3): qty 0.4

## 2018-12-20 MED ORDER — HALOPERIDOL LACTATE 5 MG/ML IJ SOLN
2.0000 mg | Freq: Once | INTRAMUSCULAR | Status: AC
  Administered 2018-12-20: 2 mg via INTRAVENOUS
  Filled 2018-12-20: qty 1

## 2018-12-20 MED ORDER — IOHEXOL 300 MG/ML  SOLN
100.0000 mL | Freq: Once | INTRAMUSCULAR | Status: AC | PRN
  Administered 2018-12-20: 21:00:00 100 mL via INTRAVENOUS

## 2018-12-20 MED ORDER — SODIUM CHLORIDE 0.9 % IV BOLUS
1000.0000 mL | Freq: Once | INTRAVENOUS | Status: AC
  Administered 2018-12-20: 1000 mL via INTRAVENOUS

## 2018-12-20 MED ORDER — LEVOFLOXACIN IN D5W 500 MG/100ML IV SOLN
500.0000 mg | INTRAVENOUS | Status: DC
  Administered 2018-12-21: 500 mg via INTRAVENOUS
  Filled 2018-12-20: qty 100

## 2018-12-20 MED ORDER — MORPHINE SULFATE ER 15 MG PO TBCR
15.0000 mg | EXTENDED_RELEASE_TABLET | ORAL | Status: DC
  Administered 2018-12-21 – 2018-12-23 (×6): 15 mg via ORAL
  Filled 2018-12-20 (×6): qty 1

## 2018-12-20 MED ORDER — LORAZEPAM 2 MG/ML IJ SOLN
0.5000 mg | Freq: Once | INTRAMUSCULAR | Status: AC
  Administered 2018-12-21: 0.5 mg via INTRAVENOUS
  Filled 2018-12-20: qty 1

## 2018-12-20 MED ORDER — ASPIRIN EC 81 MG PO TBEC
81.0000 mg | DELAYED_RELEASE_TABLET | Freq: Every day | ORAL | Status: DC
  Administered 2018-12-21 – 2018-12-23 (×3): 81 mg via ORAL
  Filled 2018-12-20 (×3): qty 1

## 2018-12-20 MED ORDER — MORPHINE SULFATE 15 MG PO TABS: 15.0000 mg | ORAL_TABLET | ORAL | Status: DC

## 2018-12-20 MED ORDER — ACETAMINOPHEN 650 MG RE SUPP
650.0000 mg | Freq: Once | RECTAL | Status: AC
  Administered 2018-12-20: 17:00:00 650 mg via RECTAL
  Filled 2018-12-20: qty 1

## 2018-12-20 MED ORDER — ALBUTEROL SULFATE (2.5 MG/3ML) 0.083% IN NEBU: 2.5000 mg | INHALATION_SOLUTION | Freq: Four times a day (QID) | RESPIRATORY_TRACT | Status: DC | PRN

## 2018-12-20 MED ORDER — DONEPEZIL HCL 5 MG PO TABS
5.0000 mg | ORAL_TABLET | Freq: Every day | ORAL | Status: DC
  Administered 2018-12-21 – 2018-12-22 (×3): 5 mg via ORAL
  Filled 2018-12-20 (×3): qty 1

## 2018-12-20 NOTE — Telephone Encounter (Signed)
Patient's daughter [name] (on [place]) called stating that she has concerns about her mom. [name] stated that her mom is having chills, fever 101.0, very disoriented and hallucinating. [name] stated that her mom has COPD, but her breathing seems to be okay. [name] stated that she is not sure how to handle this.  Called [name] back after talking with Dr. Danise Mina and advised her she should take her mom to the ER for evaluation and she verbalized understanding. [name] stated that she will take her to [facility] ER.

## 2018-12-20 NOTE — ED Notes (Signed)
Ice packs placed on pt for fever.

## 2018-12-20 NOTE — ED Notes (Signed)
ED TO INPATIENT HANDOFF REPORT  ED Nurse Name and Phone #: Benjamine Mola [phone]  S Name/Age/Gender Meredith Gutierrez 78 y.o. female Room/Bed: 027C/027C  Code Status   Code Status: Full Code  Home/SNF/Other Home Patient oriented to: self Is this baseline? No   Triage Complete: Triage complete  Chief Complaint Fever, AMS, COPD  Triage Note Chills at 1:30 pm, then temp, AMS. DTR brought in.    Allergies Allergies  Allergen Reactions  . Codeine Anaphylaxis and Shortness Of Breath  . Penicillin G Shortness Of Breath and Rash    Did it involve swelling of the face/tongue/throat, SOB, or low BP? Yes Did it involve sudden or severe rash/hives, skin peeling, or any reaction on the inside of your mouth or nose? No Did you need to seek medical attention at a hospital or doctor's office? No When did it last happen?More than 10 years ago- since 2020 If all above answers are "NO", may proceed with cephalosporin use.   Marland Kitchen Penicillins Anaphylaxis and Shortness Of Breath    Has patient had a PCN reaction causing immediate rash, facial/tongue/throat swelling, SOB or lightheadedness with hypotension: YES Has patient had a PCN reaction causing severe rash involving mucus membranes or skin necrosis: NO Has patient had a PCN reaction that required hospitalization: NO Has patient had a PCN reaction occurring within the last 10 years: NO If all of the above answers are "NO", then may proceed with Cephalosporin use.  . Prednisolone Acetate Shortness Of Breath and Nausea Only  . Prednisone Hives, Shortness Of Breath and Swelling  . Meloxicam Hives  . Nitroglycerin   . Crestor [Rosuvastatin Calcium] Other (See Comments)    Weakness with statins  . Lopressor [Metoprolol Tartrate] Other (See Comments)    Bradycardia   . Sulfamethoxazole Hives and Swelling  . Zetia [Ezetimibe] Other (See Comments)    Weakness     Level of Care/Admitting Diagnosis ED Disposition    ED Disposition Condition  Comment   Admit  Hospital Area: [facility] [[id number]]  Level of Care: Med-Surg [16]  Covid Evaluation: Confirmed COVID Negative  Diagnosis: Community acquired pneumonia of left lower lobe of lung [facility]) [[id number]]  Admitting Physician: Doreatha Massed  Attending Physician: Etta Quill [phone]  Estimated length of stay: past midnight tomorrow  Certification:: I certify this patient will need inpatient services for at least 2 midnights  PT Class (Do Not Modify): Inpatient [101]  PT Acc Code (Do Not Modify): Private [1]       B Medical/Surgery History Past Medical History:  Diagnosis Date  . Angina    never needed to take nitroglycerin.  . Anxiety   . Blood transfusion without reported diagnosis   . Cerebral atherosclerosis   . COPD (chronic obstructive pulmonary disease) ([place])    02-16-13-"pt. denies this"-shows on CXR of 2- 2013.  Marland Kitchen Coronary artery disease 2003   s/p CABG 2014  . Diabetes mellitus without complication ([place]) 00/9233   NEW ONSET  . Diastolic CHF, on admit treated. 08/10/2011  . Family history of adverse reaction to anesthesia    SISTER GETS SICK  . Fibromuscular dysplasia of renal artery (H[facility])    right  . Heart murmur   . History of kidney stones 02-16-13   "hx. of overgrowth of muscle-causing some stricture"renal artery stenosis-being followed [facility]  . Hyperlipidemia   . Hypertension   . Hyperthyroidism    [place]  . Hyponatremia 04/2018  . Melanoma ([place]) 2010   skin  cancers, 1 melanoma removed back  . Osteoarthritis    Past Surgical History:  Procedure Laterality Date  . ABDOMINAL HYSTERECTOMY  1970   heavy bleeding  . APPENDECTOMY  1960s  . CARDIAC CATHETERIZATION  08/09/2011   no sign. coronary obstruction. LIMA is atretic w/excellent flow down the native LAD  . CARDIAC CATHETERIZATION  08/22/2001   no sign. coronary stenosis,  . CATARACT EXTRACTION, BILATERAL Bilateral   . CESAREAN SECTION    . CORONARY ARTERY BYPASS GRAFT   02-16-13   3'03-no bypasses -Had 2 aneurysm repairs(stents used)  . ESOPHAGOGASTRODUODENOSCOPY  04/2018   LA Grade A esophagitis, small nodule distal esophagus (benign schwann cell hamartoma), empiric dilation to 45mm ([place])  . LAPAROTOMY N/A 02/20/2013   Procedure: EXPLORATORY LAPAROTOMY;  [name], MD  . LEFT HEART CATHETERIZATION WITH CORONARY ANGIOGRAM N/A 08/09/2011   Procedure: LEFT HEART CATHETERIZATION WITH CORONARY ANGIOGRAM;  Surgeon: Sanda Klein, MD;  Location: [facility] CATH LAB;  Service: Cardiovascular;  Laterality: N/A;  . NM MYOCAR PERF WALL MOTION  07/20/2010   normal  . SALPINGOOPHORECTOMY Bilateral 02/20/2013   benign seromucinous cystadenofibroma R ovary [name], MD)  . TOOTH EXTRACTION    . TOTAL HIP ARTHROPLASTY Left 2015     A IV Location/Drains/Wounds Patient Lines/Drains/Airways Status   Active Line/Drains/Airways    Name:   Placement date:   Placement time:   Site:   Days:   Peripheral IV 12/20/18 Right Forearm   12/20/18    1730    Forearm   less than 1   Peripheral IV 12/20/18 Left Antecubital   12/20/18    1730    Antecubital   less than 1   External Urinary Catheter   04/19/18    2238    -   245          Intake/Output Last 24 hours  Intake/Output Summary (Last 24 hours) at 12/20/2018 2235 Last data filed at 12/20/2018 2213 Gross per 24 hour  Intake 199.81 ml  Output -  Net 199.81 ml    Labs/Imaging Results for orders placed or performed during the hospital encounter of 12/20/18 (from the past 48 hour(s))  Urinalysis, Routine w reflex microscopic (not at [facility])     Status: Abnormal   Collection Time: 12/20/18  5:12 PM  Result Value Ref Range   Color, Urine YELLOW YELLOW   APPearance CLEAR CLEAR   Specific Gravity, Urine 1.011 1.005 - 1.030   pH 6.0 5.0 - 8.0   Glucose, UA >=500 (A) NEGATIVE mg/dL   Hgb urine dipstick NEGATIVE NEGATIVE   Bilirubin Urine NEGATIVE NEGATIVE   Ketones, ur NEGATIVE NEGATIVE mg/dL   Protein, ur NEGATIVE  NEGATIVE mg/dL   Nitrite NEGATIVE NEGATIVE   Leukocytes,Ua NEGATIVE NEGATIVE   RBC / HPF 0-5 0 - 5 RBC/hpf   WBC, UA 0-5 0 - 5 WBC/hpf   Bacteria, UA NONE SEEN NONE SEEN   Squamous Epithelial / LPF 0-5 0 - 5   Mucus PRESENT    Hyaline Casts, UA PRESENT     Comment: Performed at [facility], [place] [address] [place], [place] [address]  SARS Coronavirus 2 (CEPHEID- Performed in [place] hospital lab), Hosp Order     Status: None   Collection Time: 12/20/18  5:14 PM   Specimen: Nasopharyngeal Swab  Result Value Ref Range   SARS Coronavirus 2 NEGATIVE NEGATIVE    Comment: (NOTE) If result is NEGATIVE SARS-CoV-2 target nucleic acids are NOT DETECTED. The SARS-CoV-2  RNA is generally detectable in upper and lower  respiratory specimens during the acute phase of infection. The lowest  concentration of SARS-CoV-2 viral copies this assay can detect is 250  copies / mL. A negative result does not preclude SARS-CoV-2 infection  and should not be used as the sole basis for treatment or other  patient management decisions.  A negative result may occur with  improper specimen collection / handling, submission of specimen other  than nasopharyngeal swab, presence of viral mutation(s) within the  areas targeted by this assay, and inadequate number of viral copies  (<250 copies / mL). A negative result must be combined with clinical  observations, patient history, and epidemiological information. If result is POSITIVE SARS-CoV-2 target nucleic acids are DETECTED. The SARS-CoV-2 RNA is generally detectable in upper and lower  respiratory specimens dur ing the acute phase of infection.  Positive  results are indicative of active infection with SARS-CoV-2.  Clinical  correlation with patient history and other diagnostic information is  necessary to determine patient infection status.  Positive results do  not rule out bacterial infection or co-infection with other viruses. If result is  PRESUMPTIVE POSTIVE SARS-CoV-2 nucleic acids MAY BE PRESENT.   A presumptive positive result was obtained on the submitted specimen  and confirmed on repeat testing.  While 2019 novel coronavirus  (SARS-CoV-2) nucleic acids may be present in the submitted sample  additional confirmatory testing may be necessary for epidemiological  and / or clinical management purposes  to differentiate between  SARS-CoV-2 and other Sarbecovirus currently known to infect humans.  If clinically indicated additional testing with an alternate test  methodology [phone]) is advised. The SARS-CoV-2 RNA is generally  detectable in upper and lower respiratory sp ecimens during the acute  phase of infection. The expected result is Negative. Fact Sheet for Patients:  StrictlyIdeas.no Fact Sheet for Healthcare Providers: BankingDealers.co.za This test is not yet approved or cleared by the [place] FDA and has been authorized for detection and/or diagnosis of SARS-CoV-2 by FDA under an Emergency Use Authorization (EUA).  This EUA will remain in effect (meaning this test can be used) for the duration of the COVID-19 declaration under Section 564(b)(1) of the Act, 21 U.S.C. section 360bbb-3(b)(1), unless the authorization is terminated or revoked sooner. Performed at [facility], [place] [address] [place], [place] [address]   Lactic acid, plasma     Status: Abnormal   Collection Time: 12/20/18  5:22 PM  Result Value Ref Range   Lactic Acid, Venous 2.8 (HH) 0.5 - 1.9 mmol/L    Comment: CRITICAL RESULT CALLED TO, READ BACK BY AND VERIFIED WITH: E.TALYOR RN 1800 12/20/2018 MCCORMICK K Performed at [facility], [place] [address] [place], [place] [address]   CBG monitoring, ED     Status: Abnormal   Collection Time: 12/20/18  5:29 PM  Result Value Ref Range   Glucose-Capillary 262 (H) 70 - 99 mg/dL   Comment 1 Notify RN    Comment 2 Document in Chart    Ammonia     Status: None   Collection Time: 12/20/18  5:30 PM  Result Value Ref Range   Ammonia 16 9 - 35 umol/L    Comment: Performed at [facility], [place] [address], [place], [place] [address]  Comprehensive metabolic panel     Status: Abnormal   Collection Time: 12/20/18  5:33 PM  Result Value Ref Range   Sodium 130 (L) 135 - 145  mmol/L   Potassium 4.0 3.5 - 5.1 mmol/L   Chloride 94 (L) 98 - 111 mmol/L   CO2 21 (L) 22 - 32 mmol/L   Glucose, Bld 264 (H) 70 - 99 mg/dL   BUN 16 8 - 23 mg/dL   Creatinine, Ser 0.95 0.44 - 1.00 mg/dL   Calcium 9.2 8.9 - 10.3 mg/dL   Total Protein 7.1 6.5 - 8.1 g/dL   Albumin 3.9 3.5 - 5.0 g/dL   AST 19 15 - 41 U/L   ALT 16 0 - 44 U/L   Alkaline Phosphatase 91 38 - 126 U/L   Total Bilirubin 0.7 0.3 - 1.2 mg/dL   GFR calc non Af Amer 57 (L) >60 mL/min   GFR calc Af Amer >60 >60 mL/min   Anion gap 15 5 - 15    Comment: Performed at [facility] [address] [place], [place] [address]  Ethanol     Status: None   Collection Time: 12/20/18  5:33 PM  Result Value Ref Range   Alcohol, Ethyl (B) <10 <10 mg/dL    Comment: (NOTE) Lowest detectable limit for serum alcohol is 10 mg/dL. For medical purposes only. Performed at [facility], [place] [address], [place], [place] [address]   CBC WITH DIFFERENTIAL     Status: Abnormal   Collection Time: 12/20/18  5:33 PM  Result Value Ref Range   WBC 17.8 (H) 4.0 - 10.5 K/uL   RBC 4.45 3.87 - 5.11 MIL/uL   Hemoglobin 13.2 12.0 - 15.0 g/dL   HCT 39.6 36.0 - 46.0 %   MCV 89.0 80.0 - 100.0 fL   MCH 29.7 26.0 - 34.0 pg   MCHC 33.3 30.0 - 36.0 g/dL   RDW 13.4 11.5 - 15.5 %   Platelets 182 150 - 400 K/uL    Comment: REPEATED TO VERIFY SPECIMEN CHECKED FOR CLOTS    nRBC 0.0 0.0 - 0.2 %   Neutrophils Relative % 91 %   Neutro Abs 16.3 (H) 1.7 - 7.7 K/uL   Lymphocytes Relative 3 %   Lymphs Abs 0.5 (L) 0.7 - 4.0 K/uL   Monocytes Relative 5 %   Monocytes Absolute 0.9 0.1 - 1.0 K/uL    Eosinophils Relative 0 %   Eosinophils Absolute 0.0 0.0 - 0.5 K/uL   Basophils Relative 0 %   Basophils Absolute 0.0 0.0 - 0.1 K/uL   Immature Granulocytes 1 %   Abs Immature Granulocytes 0.10 (H) 0.00 - 0.07 K/uL    Comment: Performed at [facility] [address], [place], [place] [address]  TSH     Status: None   Collection Time: 12/20/18  5:57 PM  Result Value Ref Range   TSH 0.534 0.350 - 4.500 uIU/mL    Comment: Performed by a 3rd Generation assay with a functional sensitivity of <=0.01 uIU/mL. Performed at [facility], [place] [address], [place],  [address]   POCT I-Stat EG7     Status: Abnormal   Collection Time: 12/20/18  6:02 PM  Result Value Ref Range   pH, Ven 7.471 (H) 7.250 - 7.430   pCO2, Ven 35.0 (L) 44.0 - 60.0 mmHg   pO2, Ven 20.0 (LL) 32.0 - 45.0 mmHg   Bicarbonate 25.5 20.0 - 28.0 mmol/L   TCO2 27 22 - 32 mmol/L   O2 Saturation 37.0 %   Acid-Base Excess 2.0 0.0 - 2.0 mmol/L   Sodium 131 (L) 135 - 145 mmol/L  Potassium 4.2 3.5 - 5.1 mmol/L   Calcium, Ion 1.11 (L) 1.15 - 1.40 mmol/L   HCT 42.0 36.0 - 46.0 %   Hemoglobin 14.3 12.0 - 15.0 g/dL   Patient temperature HIDE    Sample type VENOUS    Comment NOTIFIED PHYSICIAN   Lactic acid, plasma     Status: Abnormal   Collection Time: 12/20/18  9:05 PM  Result Value Ref Range   Lactic Acid, Venous 2.6 (HH) 0.5 - 1.9 mmol/L    Comment: CRITICAL RESULT CALLED TO, READ BACK BY AND VERIFIED WITH: Octavis Sheeler,E RN 12/20/2018 2151 JORDANS Performed at [facility], [place] [address], [place], [place] [address]    Ct Head Wo Contrast  Result Date: 12/20/2018 CLINICAL DATA:  Altered level of consciousness EXAM: CT HEAD WITHOUT CONTRAST TECHNIQUE: Contiguous axial images were obtained from the base of the skull through the vertex without intravenous contrast. COMPARISON:  09/02/2016 FINDINGS: Brain: Chronic atrophic and ischemic changes are again identified similar to that seen on the prior exam. No  findings to suggest acute hemorrhage, acute infarction or space-occupying mass lesion are noted. Vascular: No hyperdense vessel or unexpected calcification. Skull: Normal. Negative for fracture or focal lesion. Sinuses/Orbits: No acute finding. Other: None. IMPRESSION: No significant interval change from the prior exam. Chronic atrophic and ischemic changes are seen. No acute abnormality noted. Electronically Signed   By: Inez Catalina M.D.   On: 12/20/2018 20:01   Ct Abdomen Pelvis W Contrast  Result Date: 12/20/2018 CLINICAL DATA:  Abdominal pain with fever. EXAM: CT ABDOMEN AND PELVIS WITH CONTRAST TECHNIQUE: Multidetector CT imaging of the abdomen and pelvis was performed using the standard protocol following bolus administration of intravenous contrast. CONTRAST:  158mL OMNIPAQUE IOHEXOL 300 MG/ML  SOLN COMPARISON:  None. FINDINGS: Lower chest: There is a left lower lobe airspace opacity. The heart size is enlarged.There is no significant pericardial effusion. Hepatobiliary: There is an indeterminate 3.4 cm hypoattenuating mass in hepatic segment 4A/B. The gallbladder is distended without CT evidence for acute cholecystitis.The common bile duct is dilated measuring approximately 8 mm. Pancreas: The pancreas is somewhat atrophic. The pancreatic duct is dilated. Multiple calcifications are noted in the pancreas, including in the expected region of the distal pancreatic duct. Spleen: There is a 9 mm hypoattenuating nodule in the lower pole of the spleen. There are additional scattered hypoattenuating nodules throughout the remaining portions of the spleen. The spleen is borderline enlarged measuring approximately 11 cm. Adrenals/Urinary Tract: --Adrenal glands: There is a 2.6 cm indeterminate left adrenal mass. --Right kidney/ureter: No hydronephrosis or perinephric hematoma. --Left kidney/ureter: No hydronephrosis or perinephric hematoma. --Urinary bladder: Unremarkable. Stomach/Bowel: --Stomach/Duodenum: No  hiatal hernia or other gastric abnormality. Normal duodenal course and caliber. --Small bowel: No dilatation or inflammation. --Colon: There is a large amount of stool throughout the colon. --Appendix: Not visualized. No right lower quadrant inflammation or free fluid. Vascular/Lymphatic: Atherosclerotic calcification is present within the non-aneurysmal abdominal aorta, without hemodynamically significant stenosis. --No retroperitoneal lymphadenopathy. --No mesenteric lymphadenopathy. --No pelvic or inguinal lymphadenopathy. Reproductive: Unremarkable Other: No ascites or free air. The abdominal wall is normal. Musculoskeletal. Multilevel degenerative disc disease and facet arthrosis. No bony spinal canal stenosis. IMPRESSION: 1. Left lower lobe airspace opacity concerning for a developing pneumonia. 2. Indeterminate 3.4 cm mass in hepatic segment 4. Follow-up with a nonemergent outpatient liver mass protocol MRI is recommended. 3. Distended gallbladder without evidence of acute cholecystitis. 4. Dilated common bile duct and pancreatic duct. There are multiple calcifications  in the pancreas, some of which appear to be centered within the pancreatic duct. Follow-up with a nonemergent MRCP is recommended for further evaluation. 5. Indeterminate 2.6 cm left adrenal nodule. Follow-up with a nonemergent adrenal mass protocol MRI is recommended. 6. Large amount of stool throughout the colon. No evidence for small bowel obstruction. The appendix is not reliably identified, however there are no significant inflammatory changes in the right lower quadrant. Aortic Atherosclerosis (ICD10-I70.0). Electronically Signed   By: Constance Holster M.D.   On: 12/20/2018 21:02   Dg Chest Port 1 View  Result Date: 12/20/2018 CLINICAL DATA:  Altered mental status EXAM: PORTABLE CHEST 1 VIEW COMPARISON:  December 10, 2017 FINDINGS: The patient is status post prior median sternotomy. The cardiac silhouette remains enlarged. There are low  lung volumes. There appears to be atelectasis at the left lung base with a small left-sided pleural effusion. No pneumothorax. There are advanced degenerative changes of the right glenohumeral joint. IMPRESSION: 1. Status post prior CABG.  Stable cardiac enlargement. 2. Opacification at the left lung base favored to represent a combination of both atelectasis and a small left-sided pleural effusion. Electronically Signed   By: Constance Holster M.D.   On: 12/20/2018 18:47    Pending Labs Unresulted Labs (From admission, onward)    Start     Ordered   12/21/18 0500  CBC  Tomorrow morning,   R     12/20/18 2210   12/21/18 9379  Basic metabolic panel  Tomorrow morning,   R     12/20/18 2210   12/20/18 2203  HIV antibody (Routine Screening)  Once,   STAT     12/20/18 2210   12/20/18 1713  Blood culture (routine x 2)  BLOOD CULTURE X 2,   STAT    Question:  Patient immune status  Answer:  Normal   12/20/18 1713   12/20/18 1712  Urine culture  ONCE - STAT,   STAT     12/20/18 1712          Vitals/Pain Today's Vitals   12/20/18 1702 12/20/18 1755 12/20/18 1933 12/20/18 2152  BP:  (!) 164/143  (!) 135/121  Pulse:  (!) 140  96  Resp:  (!) 29  18  Temp:   (!) 101.4 F (38.6 C)   TempSrc:   Oral   SpO2:  (!) 83%  97%  Weight: 63.5 kg     Height: 5' (1.524 m)       Isolation Precautions No active isolations  Medications Medications  LORazepam (ATIVAN) injection 0.5 mg (has no administration in time range)  enoxaparin (LOVENOX) injection 40 mg (has no administration in time range)  levofloxacin (LEVAQUIN) IVPB 500 mg (has no administration in time range)  0.9 %  sodium chloride infusion (has no administration in time range)  acetaminophen (TYLENOL) suppository 650 mg (650 mg Rectal Given 12/20/18 1729)  haloperidol lactate (HALDOL) injection 2 mg (2 mg Intravenous Given 12/20/18 1721)  sodium chloride 0.9 % bolus 1,000 mL (1,000 mLs Intravenous New Bag/Given 12/20/18 1820)   diphenhydrAMINE (BENADRYL) injection 12.5 mg (12.5 mg Intravenous Given 12/20/18 1854)  LORazepam (ATIVAN) injection 0.5 mg (0.5 mg Intravenous Given 12/20/18 1930)  aztreonam (AZACTAM) 2 g in sodium chloride 0.9 % 100 mL IVPB (0 g Intravenous Stopped 12/20/18 2146)  metroNIDAZOLE (FLAGYL) IVPB 500 mg (0 mg Intravenous Stopped 12/20/18 2213)  iohexol (OMNIPAQUE) 300 MG/ML solution 100 mL (100 mLs Intravenous Contrast Given 12/20/18 2033)    Mobility walks with  person assist High fall risk   Focused Assessments         R Recommendations: See Admitting Provider Note  Report given to:   Additional Notes:

## 2018-12-20 NOTE — ED Triage Notes (Signed)
Chills at 1:30 pm, then temp, AMS. DTR brought in.

## 2018-12-20 NOTE — ED Provider Notes (Signed)
[facility] EMERGENCY DEPARTMENT Provider Note   CSN: [id number] Arrival date & time: 12/20/18  1627    History   Chief Complaint Chief Complaint  Patient presents with   Altered Mental Status   Fever    HPI Meredith Gutierrez is a 78 y.o. female.     78 yo F with a chief complaint of altered mental status.  Patient has a history of dementia but the patient has been not effectively communicating with family since this morning.  States that she had a meal and then next time she checked on her she was not communicative.  Was found to later have a fever of 101 axillary.  No infectious symptoms per the family.  Has chronic back pain but otherwise not complaining of pain.  No cough no shortness of breath no vomiting or diarrhea.  No urinary symptoms.  Level 5 caveat altered mental status.  The history is provided by the patient and a relative.  Altered Mental Status Associated symptoms: fever   Associated symptoms: no headaches, no nausea, no palpitations and no vomiting   Fever Associated symptoms: no chest pain, no chills, no congestion, no dysuria, no headaches, no myalgias, no nausea, no rhinorrhea and no vomiting     Past Medical History:  Diagnosis Date   Angina    never needed to take nitroglycerin.   Anxiety    Blood transfusion without reported diagnosis    Cerebral atherosclerosis    COPD (chronic obstructive pulmonary disease) ([place])    02-16-13-"pt. denies this"-shows on CXR of 2- 2013.   Coronary artery disease 2003   s/p CABG 2014   Diabetes mellitus without complication ([place]) 06/7508   NEW ONSET   Diastolic CHF, on admit treated. 08/10/2011   Family history of adverse reaction to anesthesia    SISTER GETS SICK   Fibromuscular dysplasia of renal artery (HCC)    right   Heart murmur    History of kidney stones 02-16-13   "hx. of overgrowth of muscle-causing some stricture"renal artery stenosis-being followed [facility]   Hyperlipidemia      Hypertension    Hyperthyroidism    [place]   Hyponatremia 04/2018   Melanoma ([place]) 2010   skin cancers, 1 melanoma removed back   Osteoarthritis     Patient Active Problem List   Diagnosis Date Noted   Liver mass 12/20/2018   Adrenal mass ([place]) 12/20/2018   Coronary artery disease involving coronary bypass graft of native heart without angina pectoris 08/19/2018   Diabetes mellitus type 2 in nonobese ([place]) 08/19/2018   Hypercholesterolemia 08/19/2018   Recurrent boils 08/17/2018   Acute pain of left shoulder 08/17/2018   Hamartoma ([place]) 08/02/2018   Atherosclerosis of aorta ([place]) 06/29/2018   Uncontrolled diabetes mellitus type 2 without complications ([place]) 25/85/2778   Systolic murmur 24/23/5361   Hypokalemia 04/20/2018   COPD (chronic obstructive pulmonary disease) ([place]) 04/20/2018   Hyponatremia 04/19/2018   Medicare annual wellness visit, subsequent 04/07/2018   Chronic nasal congestion 04/03/2018   Multiple atypical nevi 02/08/2018   Genital warts 02/08/2018   Sundowning 02/08/2018   Pulmonary nodules 12/13/2017   Shortness of breath 12/12/2017   Esophageal dysphagia 09/07/2017   Health maintenance examination 04/13/2017   Decreased hearing, bilateral 04/13/2017   Advanced care planning/counseling discussion 04/09/2017   Vitamin D deficiency 02/14/2017   Fibromuscular dysplasia of renal artery ([place])    Chronic lower back pain 02/09/2017   Dementia ([place]) 02/09/2017   History of melanoma 02/09/2017  Hyperthyroidism    Chronic diastolic (congestive) heart failure ([place]) 08/10/2011   Dyslipidemia, statin intol. 08/07/2011   Sinus bradycardia, beta blocker intol. 08/07/2011   Chest pain, negative MI, cath with atretic LIMA, and excellent flow down the native LAD,patent grafts otherwise 08/05/2011   Essential hypertension 08/05/2011   Ex-smoker 08/05/2011   Hx of CABG 08/05/2011    Past Surgical History:  Procedure  Laterality Date   ABDOMINAL HYSTERECTOMY  1970   heavy bleeding   APPENDECTOMY  1960s   CARDIAC CATHETERIZATION  08/09/2011   no sign. coronary obstruction. LIMA is atretic w/excellent flow down the native LAD   CARDIAC CATHETERIZATION  08/22/2001   no sign. coronary stenosis,   CATARACT EXTRACTION, BILATERAL Bilateral    CESAREAN SECTION     CORONARY ARTERY BYPASS GRAFT  02-16-13   3'03-no bypasses -Had 2 aneurysm repairs(stents used)   ESOPHAGOGASTRODUODENOSCOPY  04/2018   LA Grade A esophagitis, small nodule distal esophagus (benign schwann cell hamartoma), empiric dilation to 60m ([place])   LAPAROTOMY N/A 02/20/2013   Procedure: EXPLORATORY LAPAROTOMY;  WJanie Morning MD   LEFT HEART CATHETERIZATION WITH CORONARY ANGIOGRAM N/A 08/09/2011   Procedure: LEFT HEART CATHETERIZATION WITH CORONARY ANGIOGRAM;  Surgeon: MSanda Klein MD;  [facility];  Service: Cardiovascular;  Laterality: N/A;   NM MYOCAR PERF WALL MOTION  07/20/2010   normal   SALPINGOOPHORECTOMY Bilateral 02/20/2013   benign seromucinous cystadenofibroma R ovary ([name] MD)   TOOTH EXTRACTION     TOTAL HIP ARTHROPLASTY Left 2015     OB History   No obstetric history on file.      Home Medications    Prior to Admission medications   Medication Sig Start Date End Date Taking? Authorizing Provider  ACCU-CHEK FASTCLIX LANCETS MISC 1 each by Does not apply route daily. Use as directed to check blood sugar once daily.  Dx code:  E11.65 05/02/18   GRia Bush MD  albuterol (PROVENTIL) (2.5 MG/3ML) 0.083% nebulizer solution Take 3 mLs (2.5 mg total) by nebulization every 6 (six) hours as needed for wheezing or shortness of breath. 12/13/17   GRia Bush MD  Alcohol Swabs (B-D SINGLE USE SWABS REGULAR) PADS 1 each by Does not apply route daily. Use as directed to check blood sugar once daily.  Dx code:  E11.65 05/02/18   GRia Bush MD  aspirin EC 81 MG tablet Take 81 mg by  mouth daily.    [provider]  Blood Glucose Monitoring Suppl (ACCU-CHEK AVIVA PLUS) w/Device KIT 1 each by Does not apply route daily. Use as directed to check blood sugar once daily.  Dx code:  E11.65 05/02/18   GRia Bush MD  candesartan (ATACAND) 16 MG tablet TAKE 1 TABLET BY MOUTH DAILY 12/11/18   GRia Bush MD  carboxymethylcellulose (REFRESH PLUS) 0.5 % SOLN Place 1 drop into both eyes 2 (two) times daily.    [provider]  cetirizine (ZYRTEC) 10 MG tablet Take 10 mg by mouth daily.    [provider]  chlorhexidine (HIBICLENS) 4 % external liquid Apply topically daily as needed. 08/17/18   GRia Bush MD  Cholecalciferol (VITAMIN D) 2000 units CAPS Take 1 capsule (2,000 Units total) by mouth daily. 04/13/17   GRia Bush MD  donepezil (ARICEPT) 5 MG tablet TAKE ONE TABLET BY MOUTH AT BEDTIME 08/15/17   GRia Bush MD  doxycycline (VIBRA-TABS) 100 MG tablet Take 1 tablet (100 mg total) by mouth 2 (two) times daily. 11/14/18  Ria Bush, MD  furosemide (LASIX) 80 MG tablet Take 1 tablet by mouth daily 06/05/18   Almyra Deforest, PA  glucose blood (ACCU-CHEK AVIVA PLUS) test strip 1 each by Other route as needed for other. Use as instructed to check blood sugar once daily.  Dx code:  E11.65 05/02/18   Ria Bush, MD  LORazepam (ATIVAN) 0.5 MG tablet TAKE 1 TABLET BY MOUTH AT BEDTIME AS NEEDED FOR ANXIETY OR SLEEP MAY TAKE SECOND 1/2 TABLET AS NEEDED 12/14/18   Ria Bush, MD  metFORMIN (GLUCOPHAGE-XR) 500 MG 24 hr tablet TAKE 1 TABLET BY MOUTH DAILY AFTER SUPPER 12/11/18   Ria Bush, MD  morphine (MS CONTIN) 15 MG 12 hr tablet Take 1 tablet by mouth 2 (two) times daily. 06/15/17   [provider]  nitrofurantoin, macrocrystal-monohydrate, (MACROBID) 100 MG capsule Take 1 capsule (100 mg total) by mouth 2 (two) times daily. 09/01/18   Ria Bush, MD  potassium chloride SA (K-DUR,KLOR-CON) 20 MEQ tablet  Take 2 tablets (40 mEq total) by mouth daily. 06/05/18   Almyra Deforest, PA    Family History Family History  Problem Relation Age of Onset   Stroke Mother    Hypotension Mother    Dementia Mother 23   Coronary artery disease Father    Hypertension Father    Breast cancer Maternal Aunt    Hypotension Sister    Hypertension Brother    Diabetes Brother    Lung cancer Daughter    Thyroid cancer Daughter    Colon polyps Maternal Grandfather    Colon cancer Neg Hx    Esophageal cancer Neg Hx    Stomach cancer Neg Hx    Rectal cancer Neg Hx     Social History Social History   Tobacco Use   Smoking status: Former Smoker    Packs/day: 0.10    Years: 20.00    Pack years: 2.00    Types: Cigarettes    Quit date: 09/14/2017    Years since quitting: 1.2   Smokeless tobacco: Never Used   Tobacco comment: smoking cessation info given  Substance Use Topics   Alcohol use: No   Drug use: No     Allergies   Codeine, Penicillin g, Penicillins, Prednisolone acetate, Meloxicam, Nitroglycerin, Crestor [rosuvastatin calcium], Lopressor [metoprolol tartrate], Prednisone, Sulfamethoxazole, and Zetia [ezetimibe]   Review of Systems Review of Systems  Constitutional: Positive for fever. Negative for chills.  HENT: Negative for congestion and rhinorrhea.   Eyes: Negative for redness and visual disturbance.  Respiratory: Negative for shortness of breath and wheezing.   Cardiovascular: Negative for chest pain and palpitations.  Gastrointestinal: Negative for nausea and vomiting.  Genitourinary: Negative for dysuria and urgency.  Musculoskeletal: Negative for arthralgias and myalgias.  Skin: Negative for pallor and wound.  Neurological: Negative for dizziness and headaches.     Physical Exam Updated Vital Signs BP (!) 135/121 (BP Location: Right Arm)    Pulse 96    Temp (!) 101.4 F (38.6 C) (Oral)    Resp 18    Ht 5' (1.524 m)    Wt 63.5 kg    SpO2 97%    BMI 27.34  kg/m   Physical Exam Vitals signs and nursing note reviewed.  Constitutional:      General: She is not in acute distress.    Appearance: She is well-developed. She is not diaphoretic.  HENT:     Head: Normocephalic and atraumatic.  Eyes:     Pupils: Pupils are equal,  round, and reactive to light.  Neck:     Musculoskeletal: Normal range of motion and neck supple.  Cardiovascular:     Rate and Rhythm: Normal rate and regular rhythm.     Heart sounds: No murmur. No friction rub. No gallop.   Pulmonary:     Effort: Pulmonary effort is normal.     Breath sounds: No wheezing or rales.  Abdominal:     General: There is no distension.     Palpations: Abdomen is soft.     Tenderness: There is no abdominal tenderness.  Musculoskeletal:        General: No tenderness.  Skin:    General: Skin is warm and dry.  Neurological:     Mental Status: She is alert and oriented to person, place, and time.  Psychiatric:        Behavior: Behavior normal.      ED Treatments / Results  Labs (all labs ordered are listed, but only abnormal results are displayed) Labs Reviewed  COMPREHENSIVE METABOLIC PANEL - Abnormal; Notable for the following components:      Result Value   Sodium 130 (*)    Chloride 94 (*)    CO2 21 (*)    Glucose, Bld 264 (*)    GFR calc non Af Amer 57 (*)    All other components within normal limits  CBC WITH DIFFERENTIAL/PLATELET - Abnormal; Notable for the following components:   WBC 17.8 (*)    Neutro Abs 16.3 (*)    Lymphs Abs 0.5 (*)    Abs Immature Granulocytes 0.10 (*)    All other components within normal limits  URINALYSIS, ROUTINE W REFLEX MICROSCOPIC - Abnormal; Notable for the following components:   Glucose, UA >=500 (*)    All other components within normal limits  LACTIC ACID, PLASMA - Abnormal; Notable for the following components:   Lactic Acid, Venous 2.8 (*)    All other components within normal limits  LACTIC ACID, PLASMA - Abnormal; Notable  for the following components:   Lactic Acid, Venous 2.6 (*)    All other components within normal limits  CBG MONITORING, ED - Abnormal; Notable for the following components:   Glucose-Capillary 262 (*)    All other components within normal limits  POCT I-STAT EG7 - Abnormal; Notable for the following components:   pH, Ven 7.471 (*)    pCO2, Ven 35.0 (*)    pO2, Ven 20.0 (*)    Sodium 131 (*)    Calcium, Ion 1.11 (*)    All other components within normal limits  SARS CORONAVIRUS 2 (HOSPITAL ORDER, [facility])  URINE CULTURE  CULTURE, BLOOD (ROUTINE X 2)  CULTURE, BLOOD (ROUTINE X 2)  AMMONIA  ETHANOL  TSH    EKG None  Radiology Ct Head Wo Contrast  Result Date: 12/20/2018 CLINICAL DATA:  Altered level of consciousness EXAM: CT HEAD WITHOUT CONTRAST TECHNIQUE: Contiguous axial images were obtained from the base of the skull through the vertex without intravenous contrast. COMPARISON:  09/02/2016 FINDINGS: Brain: Chronic atrophic and ischemic changes are again identified similar to that seen on the prior exam. No findings to suggest acute hemorrhage, acute infarction or space-occupying mass lesion are noted. Vascular: No hyperdense vessel or unexpected calcification. Skull: Normal. Negative for fracture or focal lesion. Sinuses/Orbits: No acute finding. Other: None. IMPRESSION: No significant interval change from the prior exam. Chronic atrophic and ischemic changes are seen. No acute abnormality noted. Electronically Signed  By: Inez Catalina M.D.   On: 12/20/2018 20:01   Ct Abdomen Pelvis W Contrast  Result Date: 12/20/2018 CLINICAL DATA:  Abdominal pain with fever. EXAM: CT ABDOMEN AND PELVIS WITH CONTRAST TECHNIQUE: Multidetector CT imaging of the abdomen and pelvis was performed using the standard protocol following bolus administration of intravenous contrast. CONTRAST:  170m OMNIPAQUE IOHEXOL 300 MG/ML  SOLN COMPARISON:  None. FINDINGS: Lower chest:  There is a left lower lobe airspace opacity. The heart size is enlarged.There is no significant pericardial effusion. Hepatobiliary: There is an indeterminate 3.4 cm hypoattenuating mass in hepatic segment 4A/B. The gallbladder is distended without CT evidence for acute cholecystitis.The common bile duct is dilated measuring approximately 8 mm. Pancreas: The pancreas is somewhat atrophic. The pancreatic duct is dilated. Multiple calcifications are noted in the pancreas, including in the expected region of the distal pancreatic duct. Spleen: There is a 9 mm hypoattenuating nodule in the lower pole of the spleen. There are additional scattered hypoattenuating nodules throughout the remaining portions of the spleen. The spleen is borderline enlarged measuring approximately 11 cm. Adrenals/Urinary Tract: --Adrenal glands: There is a 2.6 cm indeterminate left adrenal mass. --Right kidney/ureter: No hydronephrosis or perinephric hematoma. --Left kidney/ureter: No hydronephrosis or perinephric hematoma. --Urinary bladder: Unremarkable. Stomach/Bowel: --Stomach/Duodenum: No hiatal hernia or other gastric abnormality. Normal duodenal course and caliber. --Small bowel: No dilatation or inflammation. --Colon: There is a large amount of stool throughout the colon. --Appendix: Not visualized. No right lower quadrant inflammation or free fluid. Vascular/Lymphatic: Atherosclerotic calcification is present within the non-aneurysmal abdominal aorta, without hemodynamically significant stenosis. --No retroperitoneal lymphadenopathy. --No mesenteric lymphadenopathy. --No pelvic or inguinal lymphadenopathy. Reproductive: Unremarkable Other: No ascites or free air. The abdominal wall is normal. Musculoskeletal. Multilevel degenerative disc disease and facet arthrosis. No bony spinal canal stenosis. IMPRESSION: 1. Left lower lobe airspace opacity concerning for a developing pneumonia. 2. Indeterminate 3.4 cm mass in hepatic segment 4.  Follow-up with a nonemergent outpatient liver mass protocol MRI is recommended. 3. Distended gallbladder without evidence of acute cholecystitis. 4. Dilated common bile duct and pancreatic duct. There are multiple calcifications in the pancreas, some of which appear to be centered within the pancreatic duct. Follow-up with a nonemergent MRCP is recommended for further evaluation. 5. Indeterminate 2.6 cm left adrenal nodule. Follow-up with a nonemergent adrenal mass protocol MRI is recommended. 6. Large amount of stool throughout the colon. No evidence for small bowel obstruction. The appendix is not reliably identified, however there are no significant inflammatory changes in the right lower quadrant. Aortic Atherosclerosis (ICD10-I70.0). Electronically Signed   By: CConstance HolsterM.D.   On: 12/20/2018 21:02   Dg Chest Port 1 View  Result Date: 12/20/2018 CLINICAL DATA:  Altered mental status EXAM: PORTABLE CHEST 1 VIEW COMPARISON:  December 10, 2017 FINDINGS: The patient is status post prior median sternotomy. The cardiac silhouette remains enlarged. There are low lung volumes. There appears to be atelectasis at the left lung base with a small left-sided pleural effusion. No pneumothorax. There are advanced degenerative changes of the right glenohumeral joint. IMPRESSION: 1. Status post prior CABG.  Stable cardiac enlargement. 2. Opacification at the left lung base favored to represent a combination of both atelectasis and a small left-sided pleural effusion. Electronically Signed   By: CConstance HolsterM.D.   On: 12/20/2018 18:47    Procedures Procedures (including critical care time)  Medications Ordered in ED Medications  LORazepam (ATIVAN) injection 0.5 mg (has no administration in time range)  metroNIDAZOLE (FLAGYL) IVPB 500 mg (500 mg Intravenous New Bag/Given 12/20/18 2113)  aztreonam (AZACTAM) 1 g in sodium chloride 0.9 % 100 mL IVPB (has no administration in time range)  acetaminophen  (TYLENOL) suppository 650 mg (650 mg Rectal Given 12/20/18 1729)  haloperidol lactate (HALDOL) injection 2 mg (2 mg Intravenous Given 12/20/18 1721)  sodium chloride 0.9 % bolus 1,000 mL (1,000 mLs Intravenous New Bag/Given 12/20/18 1820)  diphenhydrAMINE (BENADRYL) injection 12.5 mg (12.5 mg Intravenous Given 12/20/18 1854)  LORazepam (ATIVAN) injection 0.5 mg (0.5 mg Intravenous Given 12/20/18 1930)  aztreonam (AZACTAM) 2 g in sodium chloride 0.9 % 100 mL IVPB (2 g Intravenous New Bag/Given 12/20/18 2116)  iohexol (OMNIPAQUE) 300 MG/ML solution 100 mL (100 mLs Intravenous Contrast Given 12/20/18 2033)     Initial Impression / Assessment and Plan / ED Course  I have reviewed the triage vital signs and the nursing notes.  Pertinent labs & imaging results that were available during my care of the patient were reviewed by me and considered in my medical decision making (see chart for details).        78 yo F with a chief complaint of altered mental status and fever.  Going on for the past day.  Patient is significantly agitated on arrival.  Improved mildly with Haldol.  Patient chronically on Ativan that she gets as needed for agitation.  Given a dose of Ativan with some improvement.  Patient with possible left lower lobe pneumonia on chest x-ray viewed by me though read as likely atelectasis versus small pleural effusion.  She did have diffuse abdominal pain on my exam and so CT scan was obtained which shows a left lower lobe pneumonia.  I started the patient on antibiotics with her history of remote penicillin allergy the pharmacist recommended aztreonam and Flagyl.  The patients results and plan were reviewed and discussed.   Any x-rays performed were independently reviewed by myself.   Differential diagnosis were considered with the presenting HPI.  Medications  LORazepam (ATIVAN) injection 0.5 mg (has no administration in time range)  metroNIDAZOLE (FLAGYL) IVPB 500 mg (500 mg Intravenous New  Bag/Given 12/20/18 2113)  aztreonam (AZACTAM) 1 g in sodium chloride 0.9 % 100 mL IVPB (has no administration in time range)  acetaminophen (TYLENOL) suppository 650 mg (650 mg Rectal Given 12/20/18 1729)  haloperidol lactate (HALDOL) injection 2 mg (2 mg Intravenous Given 12/20/18 1721)  sodium chloride 0.9 % bolus 1,000 mL (1,000 mLs Intravenous New Bag/Given 12/20/18 1820)  diphenhydrAMINE (BENADRYL) injection 12.5 mg (12.5 mg Intravenous Given 12/20/18 1854)  LORazepam (ATIVAN) injection 0.5 mg (0.5 mg Intravenous Given 12/20/18 1930)  aztreonam (AZACTAM) 2 g in sodium chloride 0.9 % 100 mL IVPB (2 g Intravenous New Bag/Given 12/20/18 2116)  iohexol (OMNIPAQUE) 300 MG/ML solution 100 mL (100 mLs Intravenous Contrast Given 12/20/18 2033)    Vitals:   12/20/18 1702 12/20/18 1755 12/20/18 1933 12/20/18 2152  BP:  (!) 164/143  (!) 135/121  Pulse:  (!) 140  96  Resp:  (!) 29  18  Temp:   (!) 101.4 F (38.6 C)   TempSrc:   Oral   SpO2:  (!) 83%  97%  Weight: 63.5 kg     Height: 5' (1.524 m)       Final diagnoses:  Community acquired pneumonia of left lower lobe of lung ([place])    Admission/ observation were discussed with the admitting physician, patient and/or family and they are comfortable with the plan.  Final Clinical Impressions(s) / ED Diagnoses   Final diagnoses:  Community acquired pneumonia of left lower lobe of lung [facility])    ED Discharge Orders    None       Deno Etienne, DO 12/20/18 2210

## 2018-12-20 NOTE — Progress Notes (Addendum)
Pharmacy Antibiotic Note  Meredith Gutierrez is a 78 y.o. female admitted on 12/20/2018 with infection of unknown source.  Pharmacy has been consulted for aztreonam dosing.  Noted penicillin allergy with anaphylaxis as the reaction; no history of cephalosporin use in Epic.  Tmax 101.4 WBC 17.8  Plan: Aztreonam 2g IV x1 then aztreonam 1g IV q8h Monitor renal function and clinical progression F/u C&S  Height: 5' (152.4 cm) Weight: 140 lb (63.5 kg) IBW/kg (Calculated) : 45.5  Temp (24hrs), Avg:102.8 F (39.3 C), Min:101.4 F (38.6 C), Max:104.1 F (40.1 C)  Recent Labs  Lab 12/20/18 1722 12/20/18 1733  WBC  --  17.8*  CREATININE  --  0.95  LATICACIDVEN 2.8*  --     Estimated Creatinine Clearance: 39.9 mL/min (by C-G formula based on SCr of 0.95 mg/dL).    Allergies  Allergen Reactions  . Codeine Anaphylaxis and Shortness Of Breath    Shortness of breath  . Penicillin G Shortness Of Breath and Rash  . Penicillins Anaphylaxis and Shortness Of Breath    Has patient had a PCN reaction causing immediate rash, facial/tongue/throat swelling, SOB or lightheadedness with hypotension: YES Has patient had a PCN reaction causing severe rash involving mucus membranes or skin necrosis: NO Has patient had a PCN reaction that required hospitalization: NO Has patient had a PCN reaction occurring within the last 10 years: NO If all of the above answers are "NO", then may proceed with Cephalosporin use.  . Prednisolone Acetate Nausea Only and Shortness Of Breath  . Meloxicam Hives  . Nitroglycerin   . Crestor [Rosuvastatin Calcium] Other (See Comments)    Weakness with statins  . Lopressor [Metoprolol Tartrate] Other (See Comments)    Bradycardia   . Prednisone Hives and Swelling    Shortness of breath  . Sulfamethoxazole Hives and Swelling  . Zetia [Ezetimibe] Other (See Comments)    Weakness     Antimicrobials this admission: Aztreonam 7/8 >>  Metronidazole 7/8 x1  Dose adjustments  this admission: n/a  Microbiology results: Pending  Thank you for allowing pharmacy to be a part of this patient's care.  Meredith Gutierrez 12/20/2018 8:25 PM   Doshia Meredith D. Mina Gutierrez, PharmD, BCPS, [place] 12/20/2018, 8:32 PM  ========================================  Addendum: Switch to Levaquin 500mg  IV Q24H QTc < 540ms Monitor renal fxn, clinical progress  Meredith Gutierrez D. Mina Gutierrez, PharmD, BCPS, [place] 12/20/2018, 10:13 PM

## 2018-12-20 NOTE — Telephone Encounter (Signed)
Noted. plz call tomorrow for an update.  

## 2018-12-20 NOTE — H&P (Addendum)
History and Physical    Meredith Gutierrez EXH:[id number] DOB: 1941-06-01 DOA: 12/20/2018  PCP: Ria Bush, MD  Patient coming from: Home  I have personally briefly reviewed patient's old medical records in [place]  Chief Complaint: Confusion  HPI: Meredith Gutierrez is a 78 y.o. female with medical history significant of dementia, DM2, HTN, diastolic CHF.  Patient presents to ED with AMS.  Not communicating effectively with family since this AM.  States that she had a meal and then next time she checked on her she was not communicative.  Was found to later have a fever of 101 axillary.  No infectious symptoms per the family.   Has chronic back pain but otherwise not complaining of pain.  No cough no shortness of breath no vomiting or diarrhea.  No urinary symptoms   ED Course: Got aztreonam and flagyl in ED.  Tm 104.1  WBC 17k.  CT abd/pelvis revealed LLL PNA.  Also incidental findings of Liver and adrenal masses.  As well as increased stool burden.   Review of Systems: Unable to perform due to delirium on dementia.  Past Medical History:  Diagnosis Date  . Angina    never needed to take nitroglycerin.  . Anxiety   . Blood transfusion without reported diagnosis   . Cerebral atherosclerosis   . COPD (chronic obstructive pulmonary disease) ([place])    02-16-13-"pt. denies this"-shows on CXR of 2- 2013.  Marland Kitchen Coronary artery disease 2003   s/p CABG 2014  . Diabetes mellitus without complication ([place]) 38/1017   NEW ONSET  . Diastolic CHF, on admit treated. 08/10/2011  . Family history of adverse reaction to anesthesia    SISTER GETS SICK  . Fibromuscular dysplasia of renal artery (HCC)    right  . Heart murmur   . History of kidney stones 02-16-13   "hx. of overgrowth of muscle-causing some stricture"renal artery stenosis-being followed [place]  . Hyperlipidemia   . Hypertension   . Hyperthyroidism    [place]  . Hyponatremia 04/2018  . Melanoma ([place]) 2010   skin cancers, 1  melanoma removed back  . Osteoarthritis     Past Surgical History:  Procedure Laterality Date  . ABDOMINAL HYSTERECTOMY  1970   heavy bleeding  . APPENDECTOMY  1960s  . CARDIAC CATHETERIZATION  08/09/2011   no sign. coronary obstruction. LIMA is atretic w/excellent flow down the native LAD  . CARDIAC CATHETERIZATION  08/22/2001   no sign. coronary stenosis,  . CATARACT EXTRACTION, BILATERAL Bilateral   . CESAREAN SECTION    . CORONARY ARTERY BYPASS GRAFT  02-16-13   3'03-no bypasses -Had 2 aneurysm repairs(stents used)  . ESOPHAGOGASTRODUODENOSCOPY  04/2018   LA Grade A esophagitis, small nodule distal esophagus (benign schwann cell hamartoma), empiric dilation to 66m ([place])  . LAPAROTOMY N/A 02/20/2013   Procedure: EXPLORATORY LAPAROTOMY;  WJanie Morning MD  . LEFT HEART CATHETERIZATION WITH CORONARY ANGIOGRAM N/A 08/09/2011   Procedure: LEFT HEART CATHETERIZATION WITH CORONARY ANGIOGRAM;  Surgeon: MSanda Klein MD;  [facility];  Service: Cardiovascular;  Laterality: N/A;  . NM MYOCAR PERF WALL MOTION  07/20/2010   normal  . SALPINGOOPHORECTOMY Bilateral 02/20/2013   benign seromucinous cystadenofibroma R ovary ([name] MD)  . TOOTH EXTRACTION    . TOTAL HIP ARTHROPLASTY Left 2015      reports that she quit smoking about 15 months ago. Her smoking use included cigarettes. She has a 2.00 pack-year smoking history. She has never used smokeless tobacco.  She reports that she does not drink alcohol or use drugs.  Allergies  Allergen Reactions  . Codeine Anaphylaxis and Shortness Of Breath  . Penicillin G Shortness Of Breath and Rash    Did it involve swelling of the face/tongue/throat, SOB, or low BP? Yes Did it involve sudden or severe rash/hives, skin peeling, or any reaction on the inside of your mouth or nose? No Did you need to seek medical attention at a hospital or doctor's office? No When did it last happen?More than 10 years ago- since 2020 If all  above answers are "NO", may proceed with cephalosporin use.   Marland Kitchen Penicillins Anaphylaxis and Shortness Of Breath    Has patient had a PCN reaction causing immediate rash, facial/tongue/throat swelling, SOB or lightheadedness with hypotension: YES Has patient had a PCN reaction causing severe rash involving mucus membranes or skin necrosis: NO Has patient had a PCN reaction that required hospitalization: NO Has patient had a PCN reaction occurring within the last 10 years: NO If all of the above answers are "NO", then may proceed with Cephalosporin use.  . Prednisolone Acetate Shortness Of Breath and Nausea Only  . Prednisone Hives, Shortness Of Breath and Swelling  . Meloxicam Hives  . Nitroglycerin   . Adhesive [Tape]   . Crestor [Rosuvastatin Calcium] Other (See Comments)    Weakness with statins  . Lopressor [Metoprolol Tartrate] Other (See Comments)    Bradycardia   . Sulfamethoxazole Hives and Swelling  . Zetia [Ezetimibe] Other (See Comments)    Weakness     Family History  Problem Relation Age of Onset  . Stroke Mother   . Hypotension Mother   . Dementia Mother 40  . Coronary artery disease Father   . Hypertension Father   . Breast cancer Maternal Aunt   . Hypotension Sister   . Hypertension Brother   . Diabetes Brother   . Lung cancer Daughter   . Thyroid cancer Daughter   . Colon polyps Maternal Grandfather   . Colon cancer Neg Hx   . Esophageal cancer Neg Hx   . Stomach cancer Neg Hx   . Rectal cancer Neg Hx      Prior to Admission medications   Medication Sig Start Date End Date Taking? Authorizing Provider  albuterol (PROVENTIL) (2.5 MG/3ML) 0.083% nebulizer solution Take 3 mLs (2.5 mg total) by nebulization every 6 (six) hours as needed for wheezing or shortness of breath. 12/13/17  Yes Ria Bush, MD  aspirin EC 81 MG tablet Take 81 mg by mouth daily.   Yes [provider]  candesartan (ATACAND) 16 MG tablet TAKE 1 TABLET BY MOUTH DAILY  12/11/18  Yes Ria Bush, MD  carboxymethylcellulose (REFRESH PLUS) 0.5 % SOLN Place 1 drop into both eyes 2 (two) times daily as needed (for irritation).    Yes [provider]  Cholecalciferol (VITAMIN D) 2000 units CAPS Take 1 capsule (2,000 Units total) by mouth daily. 04/13/17  Yes Ria Bush, MD  furosemide (LASIX) 80 MG tablet Take 1 tablet by mouth daily Patient taking differently: Take 80 mg by mouth daily.  06/05/18  Yes Meng, Isaac Laud, PA  LORazepam (ATIVAN) 0.5 MG tablet TAKE 1 TABLET BY MOUTH AT BEDTIME AS NEEDED FOR ANXIETY OR SLEEP MAY TAKE SECOND 1/2 TABLET AS NEEDED Patient taking differently: Take 0.5-1 mg by mouth at bedtime.  12/14/18  Yes Ria Bush, MD  metFORMIN (GLUCOPHAGE-XR) 500 MG 24 hr tablet TAKE 1 TABLET BY MOUTH DAILY AFTER SUPPER Patient  taking differently: Take 500 mg by mouth daily after supper.  12/11/18  Yes Ria Bush, MD  morphine (MS CONTIN) 15 MG 12 hr tablet Take 15 mg by mouth 2 (two) times a day.  06/15/17  Yes [provider]  morphine (MSIR) 15 MG tablet Take 15 mg by mouth See admin instructions. Take 15 mg by mouth four times a day- 6:30 AM, 7:30 AM, 1:00 PM, 3:00 PM   Yes [provider]  sodium chloride (OCEAN) 0.65 % SOLN nasal spray Place 1 spray into both nostrils as needed for congestion.   Yes [provider]  ACCU-CHEK FASTCLIX LANCETS MISC 1 each by Does not apply route daily. Use as directed to check blood sugar once daily.  Dx code:  E11.65 05/02/18   Ria Bush, MD  Alcohol Swabs (B-D SINGLE USE SWABS REGULAR) PADS 1 each by Does not apply route daily. Use as directed to check blood sugar once daily.  Dx code:  E11.65 05/02/18   Ria Bush, MD  Blood Glucose Monitoring Suppl (ACCU-CHEK AVIVA PLUS) w/Device KIT 1 each by Does not apply route daily. Use as directed to check blood sugar once daily.  Dx code:  E11.65 05/02/18   Ria Bush, MD  cetirizine (ZYRTEC) 10 MG  tablet Take 10 mg by mouth daily.    [provider]  chlorhexidine (HIBICLENS) 4 % external liquid Apply topically daily as needed. Patient not taking: Reported on 12/20/2018 08/17/18   Ria Bush, MD  donepezil (ARICEPT) 5 MG tablet TAKE ONE TABLET BY MOUTH AT BEDTIME Patient not taking: Reported on 12/20/2018 08/15/17   Ria Bush, MD  doxycycline (VIBRA-TABS) 100 MG tablet Take 1 tablet (100 mg total) by mouth 2 (two) times daily. Patient not taking: Reported on 12/20/2018 11/14/18   Ria Bush, MD  glucose blood (ACCU-CHEK AVIVA PLUS) test strip 1 each by Other route as needed for other. Use as instructed to check blood sugar once daily.  Dx code:  E11.65 05/02/18   Ria Bush, MD  nitrofurantoin, macrocrystal-monohydrate, (MACROBID) 100 MG capsule Take 1 capsule (100 mg total) by mouth 2 (two) times daily. 09/01/18   Ria Bush, MD  potassium chloride SA (K-DUR,KLOR-CON) 20 MEQ tablet Take 2 tablets (40 mEq total) by mouth daily. 06/05/18   Almyra Deforest, PA    Physical Exam: Vitals:   12/20/18 1702 12/20/18 1755 12/20/18 1933 12/20/18 2152  BP:  (!) 164/143  (!) 135/121  Pulse:  (!) 140  96  Resp:  (!) 29  18  Temp:   (!) 101.4 F (38.6 C)   TempSrc:   Oral   SpO2:  (!) 83%  97%  Weight: 63.5 kg     Height: 5' (1.524 m)       Constitutional: NAD, calm, comfortable Eyes: PERRL, lids and conjunctivae normal ENMT: Mucous membranes are moist. Posterior pharynx clear of any exudate or lesions.Normal dentition.  Neck: normal, supple, no masses, no thyromegaly Respiratory: clear to auscultation bilaterally, no wheezing, no crackles. Normal respiratory effort. No accessory muscle use.  Cardiovascular: Regular rate and rhythm, no murmurs / rubs / gallops. No extremity edema. 2+ pedal pulses. No carotid bruits.  Abdomen: Diffuse TTP on exam Musculoskeletal: no clubbing / cyanosis. No joint deformity upper and lower extremities. Good ROM, no contractures.  Normal muscle tone.  Skin: no rashes, lesions, ulcers. No induration Neurologic: CN 2-12 grossly intact. Sensation intact, DTR normal. Strength 5/5 in all 4.  Psychiatric: Confused   Labs on Admission:  I have personally reviewed following labs and imaging studies  CBC: Recent Labs  Lab 12/20/18 1733 12/20/18 1802  WBC 17.8*  --   NEUTROABS 16.3*  --   HGB 13.2 14.3  HCT 39.6 42.0  MCV 89.0  --   PLT 182  --    Basic Metabolic Panel: Recent Labs  Lab 12/20/18 1733 12/20/18 1802  NA 130* 131*  K 4.0 4.2  CL 94*  --   CO2 21*  --   GLUCOSE 264*  --   BUN 16  --   CREATININE 0.95  --   CALCIUM 9.2  --    GFR: Estimated Creatinine Clearance: 39.9 mL/min (by C-G formula based on SCr of 0.95 mg/dL). Liver Function Tests: Recent Labs  Lab 12/20/18 1733  AST 19  ALT 16  ALKPHOS 91  BILITOT 0.7  PROT 7.1  ALBUMIN 3.9   No results for input(s): LIPASE, AMYLASE in the last 168 hours. Recent Labs  Lab 12/20/18 1730  AMMONIA 16   Coagulation Profile: No results for input(s): INR, PROTIME in the last 168 hours. Cardiac Enzymes: No results for input(s): CKTOTAL, CKMB, CKMBINDEX, TROPONINI in the last 168 hours. BNP (last 3 results) Recent Labs    04/18/18 1508  PROBNP 235   HbA1C: No results for input(s): HGBA1C in the last 72 hours. CBG: Recent Labs  Lab 12/20/18 1729  GLUCAP 262*   Lipid Profile: No results for input(s): CHOL, HDL, LDLCALC, TRIG, CHOLHDL, LDLDIRECT in the last 72 hours. Thyroid Function Tests: Recent Labs    12/20/18 1757  TSH 0.534   Anemia Panel: No results for input(s): VITAMINB12, FOLATE, FERRITIN, TIBC, IRON, RETICCTPCT in the last 72 hours. Urine analysis:    Component Value Date/Time   COLORURINE YELLOW 12/20/2018 1712   APPEARANCEUR CLEAR 12/20/2018 1712   LABSPEC 1.011 12/20/2018 1712   PHURINE 6.0 12/20/2018 1712   GLUCOSEU >=500 (A) 12/20/2018 1712   HGBUR NEGATIVE 12/20/2018 1712   BILIRUBINUR NEGATIVE 12/20/2018  1712   BILIRUBINUR negative 09/01/2018 1432   KETONESUR NEGATIVE 12/20/2018 1712   PROTEINUR NEGATIVE 12/20/2018 1712   UROBILINOGEN 1.0 09/01/2018 1432   UROBILINOGEN 0.2 01/06/2014 1530   NITRITE NEGATIVE 12/20/2018 1712   LEUKOCYTESUR NEGATIVE 12/20/2018 1712    Radiological Exams on Admission: Ct Head Wo Contrast  Result Date: 12/20/2018 CLINICAL DATA:  Altered level of consciousness EXAM: CT HEAD WITHOUT CONTRAST TECHNIQUE: Contiguous axial images were obtained from the base of the skull through the vertex without intravenous contrast. COMPARISON:  09/02/2016 FINDINGS: Brain: Chronic atrophic and ischemic changes are again identified similar to that seen on the prior exam. No findings to suggest acute hemorrhage, acute infarction or space-occupying mass lesion are noted. Vascular: No hyperdense vessel or unexpected calcification. Skull: Normal. Negative for fracture or focal lesion. Sinuses/Orbits: No acute finding. Other: None. IMPRESSION: No significant interval change from the prior exam. Chronic atrophic and ischemic changes are seen. No acute abnormality noted. Electronically Signed   By: Inez Catalina M.D.   On: 12/20/2018 20:01   Ct Abdomen Pelvis W Contrast  Result Date: 12/20/2018 CLINICAL DATA:  Abdominal pain with fever. EXAM: CT ABDOMEN AND PELVIS WITH CONTRAST TECHNIQUE: Multidetector CT imaging of the abdomen and pelvis was performed using the standard protocol following bolus administration of intravenous contrast. CONTRAST:  168m OMNIPAQUE IOHEXOL 300 MG/ML  SOLN COMPARISON:  None. FINDINGS: Lower chest: There is a left lower lobe airspace opacity. The heart size is enlarged.There is no significant pericardial effusion.  Hepatobiliary: There is an indeterminate 3.4 cm hypoattenuating mass in hepatic segment 4A/B. The gallbladder is distended without CT evidence for acute cholecystitis.The common bile duct is dilated measuring approximately 8 mm. Pancreas: The pancreas is somewhat  atrophic. The pancreatic duct is dilated. Multiple calcifications are noted in the pancreas, including in the expected region of the distal pancreatic duct. Spleen: There is a 9 mm hypoattenuating nodule in the lower pole of the spleen. There are additional scattered hypoattenuating nodules throughout the remaining portions of the spleen. The spleen is borderline enlarged measuring approximately 11 cm. Adrenals/Urinary Tract: --Adrenal glands: There is a 2.6 cm indeterminate left adrenal mass. --Right kidney/ureter: No hydronephrosis or perinephric hematoma. --Left kidney/ureter: No hydronephrosis or perinephric hematoma. --Urinary bladder: Unremarkable. Stomach/Bowel: --Stomach/Duodenum: No hiatal hernia or other gastric abnormality. Normal duodenal course and caliber. --Small bowel: No dilatation or inflammation. --Colon: There is a large amount of stool throughout the colon. --Appendix: Not visualized. No right lower quadrant inflammation or free fluid. Vascular/Lymphatic: Atherosclerotic calcification is present within the non-aneurysmal abdominal aorta, without hemodynamically significant stenosis. --No retroperitoneal lymphadenopathy. --No mesenteric lymphadenopathy. --No pelvic or inguinal lymphadenopathy. Reproductive: Unremarkable Other: No ascites or free air. The abdominal wall is normal. Musculoskeletal. Multilevel degenerative disc disease and facet arthrosis. No bony spinal canal stenosis. IMPRESSION: 1. Left lower lobe airspace opacity concerning for a developing pneumonia. 2. Indeterminate 3.4 cm mass in hepatic segment 4. Follow-up with a nonemergent outpatient liver mass protocol MRI is recommended. 3. Distended gallbladder without evidence of acute cholecystitis. 4. Dilated common bile duct and pancreatic duct. There are multiple calcifications in the pancreas, some of which appear to be centered within the pancreatic duct. Follow-up with a nonemergent MRCP is recommended for further evaluation.  5. Indeterminate 2.6 cm left adrenal nodule. Follow-up with a nonemergent adrenal mass protocol MRI is recommended. 6. Large amount of stool throughout the colon. No evidence for small bowel obstruction. The appendix is not reliably identified, however there are no significant inflammatory changes in the right lower quadrant. Aortic Atherosclerosis (ICD10-I70.0). Electronically Signed   By: Constance Holster M.D.   On: 12/20/2018 21:02   Dg Chest Port 1 View  Result Date: 12/20/2018 CLINICAL DATA:  Altered mental status EXAM: PORTABLE CHEST 1 VIEW COMPARISON:  December 10, 2017 FINDINGS: The patient is status post prior median sternotomy. The cardiac silhouette remains enlarged. There are low lung volumes. There appears to be atelectasis at the left lung base with a small left-sided pleural effusion. No pneumothorax. There are advanced degenerative changes of the right glenohumeral joint. IMPRESSION: 1. Status post prior CABG.  Stable cardiac enlargement. 2. Opacification at the left lung base favored to represent a combination of both atelectasis and a small left-sided pleural effusion. Electronically Signed   By: Constance Holster M.D.   On: 12/20/2018 18:47    EKG: Independently reviewed.  Assessment/Plan Principal Problem:   Community acquired pneumonia of left lower lobe of lung ([place]) Active Problems:   Essential hypertension   Chronic diastolic (congestive) heart failure (HCC)   Dementia (HCC)   Sundowning   Diabetes mellitus type 2 in nonobese [facility])   Liver mass   Adrenal mass (HCC)   Acute encephalopathy    1. CAP of LLL - 1. PNA pathway 2. Cultures pending 3. COVID negative 4. Tylenol PRN fever 5. Levaquin (PCN anaphylaxis allergy and unknown if allergic to cephalosporins). 2. Acute encephalopathy - delirium on dementia 1. Cont home dementia meds 2. PRN ativan (she takes at home) if  needed 3. Needs either: Sitter, 1:1 nursing, or family member to be allowed to stay with patient  4. PRN ativan 3. HTN - 1. Continue home BP meds 4. Chronic diastolic CHF - hold home lasix 5. DM2 - 1. Sensitive SSI AC 6. Liver mass and adrenal mass - 1. Needs follow up MRI as outpatient. 7. Constipation - likely from chronic opiate use 1. Start Scheduled senna-docusate BID 2. PRN PO dulcolax ordered 8. Chronic back pain - 1. Continue scheduled MS contin 83m Q12H 2. Continue MSIR 165mQ6H, will make PRN since adding PRN ativan as well for delirium  DVT prophylaxis: Lovenox Code Status: Full - though no prolonged life support according to living will. Family Communication: Daughter at bedside, daughter is HCPOA as outlined in living will on file Disposition Plan: Home after admit Consults called: None Admission status: Admit to inpatient  Severity of Illness: The appropriate patient status for this patient is INPATIENT. Inpatient status is judged to be reasonable and necessary in order to provide the required intensity of service to ensure the patient's safety. The patient's presenting symptoms, physical exam findings, and initial radiographic and laboratory data in the context of their chronic comorbidities is felt to place them at high risk for further clinical deterioration. Furthermore, it is not anticipated that the patient will be medically stable for discharge from the hospital within 2 midnights of admission. The following factors support the patient status of inpatient.   IP status for treatment of LLL CAP complicated by acute encephalopathy / delirium.   * I certify that at the point of admission it is my clinical judgment that the patient will require inpatient hospital care spanning beyond 2 midnights from the point of admission due to high intensity of service, high risk for further deterioration and high frequency of surveillance required.*    GARDNER, JARED M. DO [facility]  How to contact the TR[facility] Surgery [facility] LPttending or Consulting provider 7AFlagler Beachr covering  provider during after hours 7PWest DeLandfor this patient?  1. Check the care team in [facility] look for a) attending/consulting TRH provider listed and b) the TRPam [facility] Southeam listed 2. Log into [email or web address]  Amion Physician Scheduling and messaging for groups and whole hospitals  On call and physician scheduling software for group practices, residents, hospitalists and other medical providers for call, clinic, rotation and shift schedules. OnCall Enterprise is a hospital-wide system for scheduling doctors and paging doctors on call. EasyPlot is for scientific plotting and data analysis.  [email or web address]  and use [place]'s universal password to access. If you do not have the password, please contact the hospital operator.  3. Locate the TRChi St. Vincent Infirmary Health Systemrovider you are looking for under [facility] and page to a number that you can be directly reached. 4. If you still have difficulty reaching the provider, please page the DOPioneers Medical CenterDirector on Call) for the Hospitalists listed on amion for assistance.  12/20/2018, 10:41 PM

## 2018-12-20 NOTE — ED Notes (Signed)
Patient transported to CT 

## 2018-12-21 ENCOUNTER — Other Ambulatory Visit: Payer: Self-pay

## 2018-12-21 ENCOUNTER — Inpatient Hospital Stay (HOSPITAL_COMMUNITY): Payer: Medicare HMO

## 2018-12-21 DIAGNOSIS — R918 Other nonspecific abnormal finding of lung field: Secondary | ICD-10-CM

## 2018-12-21 DIAGNOSIS — Z888 Allergy status to other drugs, medicaments and biological substances status: Secondary | ICD-10-CM

## 2018-12-21 DIAGNOSIS — F0390 Unspecified dementia without behavioral disturbance: Secondary | ICD-10-CM

## 2018-12-21 DIAGNOSIS — B951 Streptococcus, group B, as the cause of diseases classified elsewhere: Secondary | ICD-10-CM

## 2018-12-21 DIAGNOSIS — Z885 Allergy status to narcotic agent status: Secondary | ICD-10-CM

## 2018-12-21 DIAGNOSIS — Z872 Personal history of diseases of the skin and subcutaneous tissue: Secondary | ICD-10-CM

## 2018-12-21 DIAGNOSIS — R7881 Bacteremia: Secondary | ICD-10-CM

## 2018-12-21 DIAGNOSIS — B955 Unspecified streptococcus as the cause of diseases classified elsewhere: Secondary | ICD-10-CM

## 2018-12-21 DIAGNOSIS — Z96642 Presence of left artificial hip joint: Secondary | ICD-10-CM

## 2018-12-21 DIAGNOSIS — R58 Hemorrhage, not elsewhere classified: Secondary | ICD-10-CM

## 2018-12-21 DIAGNOSIS — Z87891 Personal history of nicotine dependence: Secondary | ICD-10-CM

## 2018-12-21 DIAGNOSIS — Z880 Allergy status to penicillin: Secondary | ICD-10-CM

## 2018-12-21 DIAGNOSIS — Z881 Allergy status to other antibiotic agents status: Secondary | ICD-10-CM

## 2018-12-21 DIAGNOSIS — Z91048 Other nonmedicinal substance allergy status: Secondary | ICD-10-CM

## 2018-12-21 LAB — BASIC METABOLIC PANEL
Anion gap: 10 (ref 5–15)
BUN: 18 mg/dL (ref 8–23)
CO2: 22 mmol/L (ref 22–32)
Calcium: 8.4 mg/dL — ABNORMAL LOW (ref 8.9–10.3)
Chloride: 99 mmol/L (ref 98–111)
Creatinine, Ser: 1 mg/dL (ref 0.44–1.00)
GFR calc Af Amer: >60 mL/min (ref >60)
GFR calc non Af Amer: 54 mL/min — ABNORMAL LOW (ref >60)
Glucose, Bld: 201 mg/dL — ABNORMAL HIGH (ref 70–99)
Potassium: 3.8 mmol/L (ref 3.5–5.1)
Sodium: 131 mmol/L — ABNORMAL LOW (ref 135–145)

## 2018-12-21 LAB — GLUCOSE, CAPILLARY
Glucose-Capillary: 160 mg/dL — ABNORMAL HIGH (ref 70–99)
Glucose-Capillary: 134 mg/dL — ABNORMAL HIGH (ref 70–99)
Glucose-Capillary: 146 mg/dL — ABNORMAL HIGH (ref 70–99)
Glucose-Capillary: 235 mg/dL — ABNORMAL HIGH (ref 70–99)

## 2018-12-21 LAB — BLOOD CULTURE ID PANEL (REFLEXED)

## 2018-12-21 LAB — CBC
HCT: 33.4 % — ABNORMAL LOW (ref 36.0–46.0)
Hemoglobin: 11.4 g/dL — ABNORMAL LOW (ref 12.0–15.0)
MCH: 30.2 pg (ref 26.0–34.0)
MCHC: 34.1 g/dL (ref 30.0–36.0)
MCV: 88.6 fL (ref 80.0–100.0)
Platelets: 238 10*3/uL (ref 150–400)
RBC: 3.77 MIL/uL — ABNORMAL LOW (ref 3.87–5.11)
RDW: 13.9 % (ref 11.5–15.5)
WBC: 26.5 10*3/uL — ABNORMAL HIGH (ref 4.0–10.5)
nRBC: 0 % (ref 0.0–0.2)

## 2018-12-21 LAB — URINE CULTURE: Culture: NO GROWTH

## 2018-12-21 LAB — ECHOCARDIOGRAM COMPLETE
Height: 60 in
Weight: 2240 oz

## 2018-12-21 LAB — HIV ANTIBODY (ROUTINE TESTING W REFLEX): HIV Screen 4th Generation wRfx: NONREACTIVE

## 2018-12-21 MED ORDER — POLYETHYLENE GLYCOL 3350 17 G PO PACK
17.0000 g | PACK | Freq: Two times a day (BID) | ORAL | Status: DC
  Administered 2018-12-21 (×2): 17 g via ORAL
  Filled 2018-12-21 (×5): qty 1

## 2018-12-21 MED ORDER — PNEUMOCOCCAL VAC POLYVALENT 25 MCG/0.5ML IJ INJ
0.5000 mL | INJECTION | INTRAMUSCULAR | Status: AC
  Administered 2018-12-22: 0.5 mL via INTRAMUSCULAR
  Filled 2018-12-21: qty 0.5

## 2018-12-21 MED ORDER — VANCOMYCIN HCL 10 G IV SOLR
1250.0000 mg | Freq: Once | INTRAVENOUS | Status: AC
  Administered 2018-12-21: 09:00:00 1250 mg via INTRAVENOUS
  Filled 2018-12-21: qty 1250

## 2018-12-21 MED ORDER — VANCOMYCIN HCL IN DEXTROSE 750-5 MG/150ML-% IV SOLN
750.0000 mg | INTRAVENOUS | Status: DC
  Administered 2018-12-22: 09:00:00 750 mg via INTRAVENOUS
  Filled 2018-12-21: qty 150

## 2018-12-21 NOTE — Progress Notes (Signed)
PHARMACY - PHYSICIAN COMMUNICATION CRITICAL VALUE ALERT - BLOOD CULTURE IDENTIFICATION (BCID)  Meredith Gutierrez is an 78 y.o. female who presented to [facility] on 12/20/2018 with a chief complaint of AMS  Assessment:  sepsis  Name of physician (or Provider) Contacted: R Nettey  Current antibiotics: Levaquin  Changes to prescribed antibiotics recommended:  DC levaquin.  Start vanc per Rx  Results for orders placed or performed during the hospital encounter of 12/20/18  Blood Culture ID Panel (Reflexed) (Collected: 12/20/2018  5:50 PM)  Result Value Ref Range   Enterococcus species NOT DETECTED NOT DETECTED   Listeria monocytogenes NOT DETECTED NOT DETECTED   Staphylococcus species NOT DETECTED NOT DETECTED   Staphylococcus aureus (BCID) NOT DETECTED NOT DETECTED   Streptococcus species DETECTED (A) NOT DETECTED   Streptococcus agalactiae DETECTED (A) NOT DETECTED   Streptococcus pneumoniae NOT DETECTED NOT DETECTED   Streptococcus pyogenes NOT DETECTED NOT DETECTED   Acinetobacter baumannii NOT DETECTED NOT DETECTED   Enterobacteriaceae species NOT DETECTED NOT DETECTED   Enterobacter cloacae complex NOT DETECTED NOT DETECTED   Escherichia coli NOT DETECTED NOT DETECTED   Klebsiella oxytoca NOT DETECTED NOT DETECTED   Klebsiella pneumoniae NOT DETECTED NOT DETECTED   Proteus species NOT DETECTED NOT DETECTED   Serratia marcescens NOT DETECTED NOT DETECTED   Haemophilus influenzae NOT DETECTED NOT DETECTED   Neisseria meningitidis NOT DETECTED NOT DETECTED   Pseudomonas aeruginosa NOT DETECTED NOT DETECTED   Candida albicans NOT DETECTED NOT DETECTED   Candida glabrata NOT DETECTED NOT DETECTED   Candida krusei NOT DETECTED NOT DETECTED   Candida parapsilosis NOT DETECTED NOT DETECTED   Candida tropicalis NOT DETECTED NOT DETECTED    [name] 12/21/2018  7:49 AM

## 2018-12-21 NOTE — Telephone Encounter (Signed)
Left message on vm per dpr asking pt's daughter, [name] (on dpr), to call back.  Dr. Darnell Level is asking for an update on pt.

## 2018-12-21 NOTE — Telephone Encounter (Signed)
[name] returning my call.  Says pt was dx with walking pneumonia.  Still at [facility], not sure when she will be d/c.

## 2018-12-21 NOTE — Progress Notes (Signed)
Pharmacy Antibiotic Note  Meredith Gutierrez is a 78 y.o. female admitted on 12/20/2018 with bacteremia with GroupB Strep. Pt has a true penicillin allergy therefore, vancomycin will be used to treat. Pharmacy has been consulted for vancomycin dosing.  Plan: Vancomycin Load 1250 mg IV x1 (20mg /kg), Maintenance Vancomycin 750 mg IV q24h Estimated AUC: 519.2 AUC goal of 400-550 D/C Levaquin 500mg  IV Q24H Monitor renal fxn, clinical progress  Height: 5' (152.4 cm) Weight: 140 lb (63.5 kg) IBW/kg (Calculated) : 45.5  Temp (24hrs), Avg:100.7 F (38.2 C), Min:99 F (37.2 C), Max:104.1 F (40.1 C)  Recent Labs  Lab 12/20/18 1722 12/20/18 1733 12/20/18 2105 12/21/18 0427  WBC  --  17.8*  --  26.5*  CREATININE  --  0.95  --  1.00  LATICACIDVEN 2.8*  --  2.6*  --     Estimated Creatinine Clearance: 38 mL/min (by C-G formula based on SCr of 1 mg/dL).    Allergies  Allergen Reactions  . Codeine Anaphylaxis and Shortness Of Breath  . Penicillin G Shortness Of Breath and Rash    Did it involve swelling of the face/tongue/throat, SOB, or low BP? Yes Did it involve sudden or severe rash/hives, skin peeling, or any reaction on the inside of your mouth or nose? No Did you need to seek medical attention at a hospital or doctor's office? No When did it last happen?More than 10 years ago- since 2020 If all above answers are "NO", may proceed with cephalosporin use.   Marland Kitchen Penicillins Anaphylaxis and Shortness Of Breath    Has patient had a PCN reaction causing immediate rash, facial/tongue/throat swelling, SOB or lightheadedness with hypotension: YES Has patient had a PCN reaction causing severe rash involving mucus membranes or skin necrosis: NO Has patient had a PCN reaction that required hospitalization: NO Has patient had a PCN reaction occurring within the last 10 years: NO If all of the above answers are "NO", then may proceed with Cephalosporin use.  . Prednisolone Acetate Shortness Of  Breath and Nausea Only  . Prednisone Hives, Shortness Of Breath and Swelling  . Meloxicam Hives  . Nitroglycerin Other (See Comments)    Reaction not recalled   . Adhesive [Tape] Rash and Other (See Comments)    SKIN IS VERY SENSITIVE- TEARS AND BRUISES EASILY, also  . Crestor [Rosuvastatin Calcium] Other (See Comments)    Weakness with statins  . Lopressor [Metoprolol Tartrate] Other (See Comments)    Bradycardia   . Sulfamethoxazole Hives and Swelling  . Zetia [Ezetimibe] Other (See Comments)    Weakness    Antimicrobials this admission: 7/8 Azactam >> 7/8 7/8 LVQ >> 7/9 7/9 Vancomycin >>  Microbiology results: 7/8 BCx: Streotococcus agalactiae UCx: Not collected Sputum: Not collected  Thank you for allowing pharmacy to be a part of this patient's care.  Acey Lav, PharmD  PGY1 Pharmacy Resident [facility] [phone] 12/21/2018 8:25 AM

## 2018-12-21 NOTE — Progress Notes (Signed)
PROGRESS NOTE    Meredith Gutierrez  NAT:[id number] DOB: [birth year] DOA: 12/20/2018 PCP: Ria Bush, MD   Brief Narrative: Meredith Gutierrez is a 78 y.o. female with medical history significant of dementia, DM2, HTN, diastolic CHF. She presented secondary to confusion/mental status change and found to have a likely LLL pneumonia with bacteremia.   Assessment & Plan:   Principal Problem:   Community acquired pneumonia of left lower lobe of lung ([place]) Active Problems:   Essential hypertension   Chronic diastolic (congestive) heart failure (HCC)   Dementia (HCC)   Sundowning   Diabetes mellitus type 2 in nonobese [facility])   Liver mass   Adrenal mass (HCC)   Acute encephalopathy   Strep bacteremia Likely source is pneumonia. Strep agalactaie isolated on blood cultures. Urine culture still pending. -Vancomycin per pharmacy -Transthoracic Echocardiogram -ID consult -Will make NPO after midnight in case Transesophageal Echocardiogram is recommended -Repeat blood cultures pending sensitivities  Sepsis Present on admission. Secondary to above.  LLL pneumonia Possible source for bacteremia. Given Aztreonam and Flagyl int he ED then transitioned to Levaquin for CAP. -Antibiotics as mentioned above  Essential hypertension -Continue irbesartan  Chronic diastolic heart failure -Continue to hold home lasix  Diabetes mellitus, type 2 -Continue SSI  Liver/adrenal mass -Outpatient MRI  Constipation Chronic and in setting of narcotic use. -Continue senokot-S -Miralax  Chronic back pain -Continue home MS Contin and MS IR prn  Acute metabolic encephalopathy Secondary to infection. Improving.  Dementia   DVT prophylaxis: Lovenox Code Status:   Code Status: Full Code Family Communication: Daughter at bedside Disposition Plan: Discharge home likely in 2-4 days pending workup of bacteremia   Consultants:   Infectious disease  Procedures:    none  Antimicrobials:  Aztreonam  Flagyl  Levaquin  Vancomycin (7/9>>    Subjective: No issues overnight. Patient's mental status improved from yesterday per daughter.  Objective: Vitals:   12/21/18 0159 12/21/18 0445 12/21/18 0724 12/21/18 1100  BP: (!) 100/44 100/80 119/61 (!) 92/55  Pulse: 100 93 89 85  Resp:  17 17 15   Temp:  99.5 F (37.5 C) 99 F (37.2 C) 99.7 F (37.6 C)  TempSrc:  Oral Oral Oral  SpO2: 100% (!) 83% 98% 95%  Weight:      Height:        Intake/Output Summary (Last 24 hours) at 12/21/2018 1119 Last data filed at 12/21/2018 0500 Gross per 24 hour  Intake 571.55 ml  Output --  Net 571.55 ml   Filed Weights   12/20/18 1702  Weight: 63.5 kg    Examination:  General exam: Appears calm and comfortable  Respiratory system: Clear to auscultation. Respiratory effort normal. Cardiovascular system: S1 & S2 heard, RRR. No murmurs, rubs, gallops or clicks. Gastrointestinal system: Abdomen is nondistended, soft and nontender. No organomegaly or masses felt. Normal bowel sounds heard. Central nervous system: Alert and oriented to person only. No focal neurological deficits. Extremities: No edema. No calf tenderness Skin: No cyanosis. No rashes Psychiatry: Judgement and insight appear impaired. Mood & affect appropriate.     Data Reviewed: I have personally reviewed following labs and imaging studies  CBC: Recent Labs  Lab 12/20/18 1733 12/20/18 1802 12/21/18 0427  WBC 17.8*  --  26.5*  NEUTROABS 16.3*  --   --   HGB 13.2 14.3 11.4*  HCT 39.6 42.0 33.4*  MCV 89.0  --  88.6  PLT 182  --  427   Basic Metabolic Panel: Recent Labs  Lab 12/20/18 1733 12/20/18 1802 12/21/18 0427  NA 130* 131* 131*  K 4.0 4.2 3.8  CL 94*  --  99  CO2 21*  --  22  GLUCOSE 264*  --  201*  BUN 16  --  18  CREATININE 0.95  --  1.00  CALCIUM 9.2  --  8.4*   GFR: Estimated Creatinine Clearance: 38 mL/min (by C-G formula based on SCr of 1 mg/dL). Liver  Function Tests: Recent Labs  Lab 12/20/18 1733  AST 19  ALT 16  ALKPHOS 91  BILITOT 0.7  PROT 7.1  ALBUMIN 3.9   No results for input(s): LIPASE, AMYLASE in the last 168 hours. Recent Labs  Lab 12/20/18 1730  AMMONIA 16   Coagulation Profile: No results for input(s): INR, PROTIME in the last 168 hours. Cardiac Enzymes: No results for input(s): CKTOTAL, CKMB, CKMBINDEX, TROPONINI in the last 168 hours. BNP (last 3 results) Recent Labs    04/18/18 1508  PROBNP 235   HbA1C: No results for input(s): HGBA1C in the last 72 hours. CBG: Recent Labs  Lab 12/20/18 1729 12/21/18 0659 12/21/18 1104  GLUCAP 262* 160* 134*   Lipid Profile: No results for input(s): CHOL, HDL, LDLCALC, TRIG, CHOLHDL, LDLDIRECT in the last 72 hours. Thyroid Function Tests: Recent Labs    12/20/18 1757  TSH 0.534   Anemia Panel: No results for input(s): VITAMINB12, FOLATE, FERRITIN, TIBC, IRON, RETICCTPCT in the last 72 hours. Sepsis Labs: Recent Labs  Lab 12/20/18 1722 12/20/18 2105  LATICACIDVEN 2.8* 2.6*    Recent Results (from the past 240 hour(s))  SARS Coronavirus 2 (CEPHEID- Performed in [place] hospital lab), Hosp Order     Status: None   Collection Time: 12/20/18  5:14 PM   Specimen: Nasopharyngeal Swab  Result Value Ref Range Status   SARS Coronavirus 2 NEGATIVE NEGATIVE Final    Comment: (NOTE) If result is NEGATIVE SARS-CoV-2 target nucleic acids are NOT DETECTED. The SARS-CoV-2 RNA is generally detectable in upper and lower  respiratory specimens during the acute phase of infection. The lowest  concentration of SARS-CoV-2 viral copies this assay can detect is 250  copies / mL. A negative result does not preclude SARS-CoV-2 infection  and should not be used as the sole basis for treatment or other  patient management decisions.  A negative result may occur with  improper specimen collection / handling, submission of specimen other  than nasopharyngeal swab,  presence of viral mutation(s) within the  areas targeted by this assay, and inadequate number of viral copies  (<250 copies / mL). A negative result must be combined with clinical  observations, patient history, and epidemiological information. If result is POSITIVE SARS-CoV-2 target nucleic acids are DETECTED. The SARS-CoV-2 RNA is generally detectable in upper and lower  respiratory specimens dur ing the acute phase of infection.  Positive  results are indicative of active infection with SARS-CoV-2.  Clinical  correlation with patient history and other diagnostic information is  necessary to determine patient infection status.  Positive results do  not rule out bacterial infection or co-infection with other viruses. If result is PRESUMPTIVE POSTIVE SARS-CoV-2 nucleic acids MAY BE PRESENT.   A presumptive positive result was obtained on the submitted specimen  and confirmed on repeat testing.  While 2019 novel coronavirus  (SARS-CoV-2) nucleic acids may be present in the submitted sample  additional confirmatory testing may be necessary for epidemiological  and / or clinical management purposes  to differentiate between  SARS-CoV-2 and other Sarbecovirus currently known to infect humans.  If clinically indicated additional testing with an alternate test  methodology [phone]) is advised. The SARS-CoV-2 RNA is generally  detectable in upper and lower respiratory sp ecimens during the acute  phase of infection. The expected result is Negative. Fact Sheet for Patients:  StrictlyIdeas.no Fact Sheet for Healthcare Providers: BankingDealers.co.za This test is not yet approved or cleared by the [place] FDA and has been authorized for detection and/or diagnosis of SARS-CoV-2 by FDA under an Emergency Use Authorization (EUA).  This EUA will remain in effect (meaning this test can be used) for the duration of the COVID-19 declaration under  Section 564(b)(1) of the Act, 21 U.S.C. section 360bbb-3(b)(1), unless the authorization is terminated or revoked sooner. Performed at [facility], [place] [address] [place], [place] [address]   Blood culture (routine x 2)     Status: None (Preliminary result)   Collection Time: 12/20/18  5:50 PM   Specimen: BLOOD  Result Value Ref Range Status   Specimen Description BLOOD RIGHT ANTECUBITAL  Final   Special Requests   Final    BOTTLES DRAWN AEROBIC AND ANAEROBIC Blood Culture results may not be optimal due to an inadequate volume of blood received in culture bottles   Culture  Setup Time   Final    GRAM POSITIVE COCCI IN BOTH AEROBIC AND ANAEROBIC BOTTLES CRITICAL RESULT CALLED TO, READ BACK BY AND VERIFIED WITH: PHARMD L  [id number] AT 17 AM BY CM Performed at [facility], [place] [address] [place], [place] [address]    Culture GRAM POSITIVE COCCI  Final   Report Status PENDING  Incomplete  Blood Culture ID Panel (Reflexed)     Status: Abnormal   Collection Time: 12/20/18  5:50 PM  Result Value Ref Range Status   Enterococcus species NOT DETECTED NOT DETECTED Final   Listeria monocytogenes NOT DETECTED NOT DETECTED Final   Staphylococcus species NOT DETECTED NOT DETECTED Final   Staphylococcus aureus (BCID) NOT DETECTED NOT DETECTED Final   Streptococcus species DETECTED (A) NOT DETECTED Final    Comment: CRITICAL RESULT CALLED TO, READ BACK BY AND VERIFIED WITH: PHARMD L SEAY [id number] AT 742 AM BY CM    Streptococcus agalactiae DETECTED (A) NOT DETECTED Final    Comment: CRITICAL RESULT CALLED TO, READ BACK BY AND VERIFIED WITH: PHARMD L SEAY [id number] AT 742 AM BY CM    Streptococcus pneumoniae NOT DETECTED NOT DETECTED Final   Streptococcus pyogenes NOT DETECTED NOT DETECTED Final   Acinetobacter baumannii NOT DETECTED NOT DETECTED Final   Enterobacteriaceae species NOT DETECTED NOT DETECTED Final   Enterobacter cloacae complex NOT DETECTED NOT DETECTED Final    Escherichia coli NOT DETECTED NOT DETECTED Final   Klebsiella oxytoca NOT DETECTED NOT DETECTED Final   Klebsiella pneumoniae NOT DETECTED NOT DETECTED Final   Proteus species NOT DETECTED NOT DETECTED Final   Serratia marcescens NOT DETECTED NOT DETECTED Final   Haemophilus influenzae NOT DETECTED NOT DETECTED Final   Neisseria meningitidis NOT DETECTED NOT DETECTED Final   Pseudomonas aeruginosa NOT DETECTED NOT DETECTED Final   Candida albicans NOT DETECTED NOT DETECTED Final   Candida glabrata NOT DETECTED NOT DETECTED Final   Candida krusei NOT DETECTED NOT DETECTED Final   Candida parapsilosis NOT DETECTED NOT DETECTED Final   Candida tropicalis NOT DETECTED NOT DETECTED Final    Comment: Performed at [facility], [address] [place], [place] [address]  Blood culture (routine x 2)     Status: None (Preliminary result)   Collection Time: 12/20/18  5:57 PM   Specimen: BLOOD RIGHT WRIST  Result Value Ref Range Status   Specimen Description BLOOD RIGHT WRIST  Final   Special Requests   Final    BOTTLES DRAWN AEROBIC AND ANAEROBIC Blood Culture results may not be optimal due to an inadequate volume of blood received in culture bottles   Culture  Setup Time   Final    GRAM POSITIVE COCCI IN BOTH AEROBIC AND ANAEROBIC BOTTLES CRITICAL RESULT CALLED TO, READ BACK BY AND VERIFIED WITH: PHARMD L SEAY [id number] AT 742 AM BY CM Performed at [facility], [place] [address], [place], [place] [address]    Culture GRAM POSITIVE COCCI  Final   Report Status PENDING  Incomplete         Radiology Studies: Ct Head Wo Contrast  Result Date: 12/20/2018 CLINICAL DATA:  Altered level of consciousness EXAM: CT HEAD WITHOUT CONTRAST TECHNIQUE: Contiguous axial images were obtained from the base of the skull through the vertex without intravenous contrast. COMPARISON:  09/02/2016 FINDINGS: Brain: Chronic atrophic and ischemic changes are again identified similar to that seen on the prior  exam. No findings to suggest acute hemorrhage, acute infarction or space-occupying mass lesion are noted. Vascular: No hyperdense vessel or unexpected calcification. Skull: Normal. Negative for fracture or focal lesion. Sinuses/Orbits: No acute finding. Other: None. IMPRESSION: No significant interval change from the prior exam. Chronic atrophic and ischemic changes are seen. No acute abnormality noted. Electronically Signed   By: Inez Catalina M.D.   On: 12/20/2018 20:01   Ct Abdomen Pelvis W Contrast  Result Date: 12/20/2018 CLINICAL DATA:  Abdominal pain with fever. EXAM: CT ABDOMEN AND PELVIS WITH CONTRAST TECHNIQUE: Multidetector CT imaging of the abdomen and pelvis was performed using the standard protocol following bolus administration of intravenous contrast. CONTRAST:  142mL OMNIPAQUE IOHEXOL 300 MG/ML  SOLN COMPARISON:  None. FINDINGS: Lower chest: There is a left lower lobe airspace opacity. The heart size is enlarged.There is no significant pericardial effusion. Hepatobiliary: There is an indeterminate 3.4 cm hypoattenuating mass in hepatic segment 4A/B. The gallbladder is distended without CT evidence for acute cholecystitis.The common bile duct is dilated measuring approximately 8 mm. Pancreas: The pancreas is somewhat atrophic. The pancreatic duct is dilated. Multiple calcifications are noted in the pancreas, including in the expected region of the distal pancreatic duct. Spleen: There is a 9 mm hypoattenuating nodule in the lower pole of the spleen. There are additional scattered hypoattenuating nodules throughout the remaining portions of the spleen. The spleen is borderline enlarged measuring approximately 11 cm. Adrenals/Urinary Tract: --Adrenal glands: There is a 2.6 cm indeterminate left adrenal mass. --Right kidney/ureter: No hydronephrosis or perinephric hematoma. --Left kidney/ureter: No hydronephrosis or perinephric hematoma. --Urinary bladder: Unremarkable. Stomach/Bowel:  --Stomach/Duodenum: No hiatal hernia or other gastric abnormality. Normal duodenal course and caliber. --Small bowel: No dilatation or inflammation. --Colon: There is a large amount of stool throughout the colon. --Appendix: Not visualized. No right lower quadrant inflammation or free fluid. Vascular/Lymphatic: Atherosclerotic calcification is present within the non-aneurysmal abdominal aorta, without hemodynamically significant stenosis. --No retroperitoneal lymphadenopathy. --No mesenteric lymphadenopathy. --No pelvic or inguinal lymphadenopathy. Reproductive: Unremarkable Other: No ascites or free air. The abdominal wall is normal. Musculoskeletal. Multilevel degenerative disc disease and facet arthrosis. No bony spinal canal stenosis. IMPRESSION: 1. Left lower lobe airspace opacity concerning for a developing pneumonia. 2. Indeterminate 3.4 cm mass  in hepatic segment 4. Follow-up with a nonemergent outpatient liver mass protocol MRI is recommended. 3. Distended gallbladder without evidence of acute cholecystitis. 4. Dilated common bile duct and pancreatic duct. There are multiple calcifications in the pancreas, some of which appear to be centered within the pancreatic duct. Follow-up with a nonemergent MRCP is recommended for further evaluation. 5. Indeterminate 2.6 cm left adrenal nodule. Follow-up with a nonemergent adrenal mass protocol MRI is recommended. 6. Large amount of stool throughout the colon. No evidence for small bowel obstruction. The appendix is not reliably identified, however there are no significant inflammatory changes in the right lower quadrant. Aortic Atherosclerosis (ICD10-I70.0). Electronically Signed   By: Constance Holster M.D.   On: 12/20/2018 21:02   Dg Chest Port 1 View  Result Date: 12/20/2018 CLINICAL DATA:  Altered mental status EXAM: PORTABLE CHEST 1 VIEW COMPARISON:  December 10, 2017 FINDINGS: The patient is status post prior median sternotomy. The cardiac silhouette remains  enlarged. There are low lung volumes. There appears to be atelectasis at the left lung base with a small left-sided pleural effusion. No pneumothorax. There are advanced degenerative changes of the right glenohumeral joint. IMPRESSION: 1. Status post prior CABG.  Stable cardiac enlargement. 2. Opacification at the left lung base favored to represent a combination of both atelectasis and a small left-sided pleural effusion. Electronically Signed   By: Constance Holster M.D.   On: 12/20/2018 18:47        Scheduled Meds:  aspirin EC  81 mg Oral Daily   donepezil  5 mg Oral QHS   enoxaparin (LOVENOX) injection  40 mg Subcutaneous Q24H   insulin aspart  0-9 Units Subcutaneous TID WC   irbesartan  150 mg Oral Daily   LORazepam  0.5-1 mg Oral Q24H   morphine  15 mg Oral 2 times per day   [START ON 12/22/2018] pneumococcal 23 valent vaccine  0.5 mL Intramuscular Tomorrow-1000   polyethylene glycol  17 g Oral BID   senna-docusate  1 tablet Oral BID   Continuous Infusions:  sodium chloride 75 mL/hr at 12/21/18 0500   [START ON 12/22/2018] vancomycin       LOS: 1 day     Cordelia Poche, MD [facility] 12/21/2018, 11:19 AM  If 7PM-7AM, please contact night-coverage [email or web address]

## 2018-12-21 NOTE — Consult Note (Signed)
[facility] for Infectious Disease    Date of Admission:  12/20/2018           Day 1 vancomycin       Reason for Consult: Group B streptococcal bacteremia    Referring Provider: Dr. Cordelia Poche  Assessment: Meredith Gutierrez has acute group B streptococcal bacteremia.  I am not clear what the source might be.  I am not convinced that she has left lower lobe pneumonia.  She seems to be improving fairly promptly.  I agree with continuing vancomycin for now.  Plan: 1. Continue vancomycin 2. We will follow-up tomorrow  Principal Problem:   Bacteremia due to group B Streptococcus Active Problems:   Acute encephalopathy   Dementia (HCC)   Essential hypertension   Hx of CABG   Chronic diastolic (congestive) heart failure (HCC)   COPD (chronic obstructive pulmonary disease) (HCC)   Recurrent boils   Coronary artery disease involving coronary bypass graft of native heart without angina pectoris   Diabetes mellitus type 2 in nonobese (HCC)   Liver mass   Adrenal mass (HCC)   H/O total hip arthroplasty, left   Scheduled Meds: . aspirin EC  81 mg Oral Daily  . donepezil  5 mg Oral QHS  . enoxaparin (LOVENOX) injection  40 mg Subcutaneous Q24H  . insulin aspart  0-9 Units Subcutaneous TID WC  . irbesartan  150 mg Oral Daily  . LORazepam  0.5-1 mg Oral Q24H  . morphine  15 mg Oral 2 times per day  . [START ON 12/22/2018] pneumococcal 23 valent vaccine  0.5 mL Intramuscular Tomorrow-1000  . polyethylene glycol  17 g Oral BID  . senna-docusate  1 tablet Oral BID   Continuous Infusions: . sodium chloride 75 mL/hr at 12/21/18 1441  . [START ON 12/22/2018] vancomycin     PRN Meds:.albuterol, bisacodyl, LORazepam, morphine, polyvinyl alcohol  HPI: Meredith Gutierrez is a 78 y.o. female with a history of dementia.  She lives with her daughter.  Yesterday morning she was in her usual state of health other than perhaps being a little bit more sleepy recently.  In the afternoon she  developed chills, fever and became less responsive leading to admission here last night.  Her temperature upon admission was 104.1.  Her physical exam did not reveal any obvious source.  Her daughter says that she has had problems with recurrent boils in the perineum.  It has been about a month since she had one.  She received an antibiotic from her primary care physician and it resolved promptly.  Her daughter gave her a bath this morning and examined her thoroughly and says that there are no active lesions currently.  A chest x-ray showed: "Opacification at the left lung base favored to represent a combination of both atelectasis and a small left-sided pleural effusion."  CT scan of her abdomen and pelvis showed her lung bases and raised a question of left lower lobe airspace disease as well.  Her daughter states that she has not been coughing and has not seemed to be short of breath recently.  She was given a dose of levofloxacin and metronidazole after cultures were obtained.  Then switched to IV vancomycin.  She has a history of penicillin allergy with her reactions listed as anaphylaxis, shortness of breath and rash.  We have not been able to find evidence that she has taken and tolerated cephalosporins in the past.  Her daughter states  that she looks much better this afternoon and seems to be close to her normal baseline.   Review of Systems: Review of Systems  Unable to perform ROS: Mental acuity    Past Medical History:  Diagnosis Date  . Angina    never needed to take nitroglycerin.  . Anxiety   . Blood transfusion without reported diagnosis   . Cerebral atherosclerosis   . COPD (chronic obstructive pulmonary disease) ([place])    02-16-13-"pt. denies this"-shows on CXR of 2- 2013.  Marland Kitchen Coronary artery disease 2003   s/p CABG 2014  . Diabetes mellitus without complication ([place]) 19/4174   NEW ONSET  . Diastolic CHF, on admit treated. 08/10/2011  . Family history of adverse reaction to  anesthesia    SISTER GETS SICK  . Fibromuscular dysplasia of renal artery (HCC)    right  . Heart murmur   . History of kidney stones 02-16-13   "hx. of overgrowth of muscle-causing some stricture"renal artery stenosis-being followed [facility]  . Hyperlipidemia   . Hypertension   . Hyperthyroidism    [place]  . Hyponatremia 04/2018  . Melanoma ([place]) 2010   skin cancers, 1 melanoma removed back  . Osteoarthritis     Social History   Tobacco Use  . Smoking status: Former Smoker    Packs/day: 0.10    Years: 20.00    Pack years: 2.00    Types: Cigarettes    Quit date: 09/14/2017    Years since quitting: 1.2  . Smokeless tobacco: Never Used  . Tobacco comment: smoking cessation info given  Substance Use Topics  . Alcohol use: No  . Drug use: No    Family History  Problem Relation Age of Onset  . Stroke Mother   . Hypotension Mother   . Dementia Mother 52  . Coronary artery disease Father   . Hypertension Father   . Breast cancer Maternal Aunt   . Hypotension Sister   . Hypertension Brother   . Diabetes Brother   . Lung cancer Daughter   . Thyroid cancer Daughter   . Colon polyps Maternal Grandfather   . Colon cancer Neg Hx   . Esophageal cancer Neg Hx   . Stomach cancer Neg Hx   . Rectal cancer Neg Hx    Allergies  Allergen Reactions  . Codeine Anaphylaxis and Shortness Of Breath  . Penicillin G Shortness Of Breath and Rash    Did it involve swelling of the face/tongue/throat, SOB, or low BP? Yes Did it involve sudden or severe rash/hives, skin peeling, or any reaction on the inside of your mouth or nose? No Did you need to seek medical attention at a hospital or doctor's office? No When did it last happen?More than 10 years ago- since 2020 If all above answers are "NO", may proceed with cephalosporin use.   Marland Kitchen Penicillins Anaphylaxis and Shortness Of Breath    Has patient had a PCN reaction causing immediate rash, facial/tongue/throat swelling, SOB or  lightheadedness with hypotension: YES Has patient had a PCN reaction causing severe rash involving mucus membranes or skin necrosis: NO Has patient had a PCN reaction that required hospitalization: NO Has patient had a PCN reaction occurring within the last 10 years: NO If all of the above answers are "NO", then may proceed with Cephalosporin use.  . Prednisolone Acetate Shortness Of Breath and Nausea Only  . Prednisone Hives, Shortness Of Breath and Swelling  . Meloxicam Hives  . Nitroglycerin Other (See Comments)  Reaction not recalled   . Adhesive [Tape] Rash and Other (See Comments)    SKIN IS VERY SENSITIVE- TEARS AND BRUISES EASILY, also  . Crestor [Rosuvastatin Calcium] Other (See Comments)    Weakness with statins  . Lopressor [Metoprolol Tartrate] Other (See Comments)    Bradycardia   . Sulfamethoxazole Hives and Swelling  . Zetia [Ezetimibe] Other (See Comments)    Weakness     OBJECTIVE: Blood pressure (!) 107/58, pulse 79, temperature 99.2 F (37.3 C), temperature source Oral, resp. rate 15, height 5' (1.524 m), weight 63.5 kg, SpO2 100 %.  Physical Exam Constitutional:      Comments: She is resting quietly in bed with her eyes closed.  She opens her eyes and smiles as soon as I touch her skin.  Cardiovascular:     Rate and Rhythm: Normal rate and regular rhythm.     Heart sounds: No murmur.  Pulmonary:     Effort: Pulmonary effort is normal.     Breath sounds: Normal breath sounds. No wheezing or rales.  Abdominal:     Palpations: Abdomen is soft.     Tenderness: There is no abdominal tenderness.  Musculoskeletal:        General: No swelling or tenderness.  Skin:    Findings: No rash.     Comments: Scattered ecchymoses     Lab Results Lab Results  Component Value Date   WBC 26.5 (H) 12/21/2018   HGB 11.4 (L) 12/21/2018   HCT 33.4 (L) 12/21/2018   MCV 88.6 12/21/2018   PLT 238 12/21/2018    Lab Results  Component Value Date   CREATININE 1.00  12/21/2018   BUN 18 12/21/2018   NA 131 (L) 12/21/2018   K 3.8 12/21/2018   CL 99 12/21/2018   CO2 22 12/21/2018    Lab Results  Component Value Date   ALT 16 12/20/2018   AST 19 12/20/2018   ALKPHOS 91 12/20/2018   BILITOT 0.7 12/20/2018     Microbiology: Recent Results (from the past 240 hour(s))  SARS Coronavirus 2 (CEPHEID- Performed in [facility] hospital lab), Hosp Order     Status: None   Collection Time: 12/20/18  5:14 PM   Specimen: Nasopharyngeal Swab  Result Value Ref Range Status   SARS Coronavirus 2 NEGATIVE NEGATIVE Final    Comment: (NOTE) If result is NEGATIVE SARS-CoV-2 target nucleic acids are NOT DETECTED. The SARS-CoV-2 RNA is generally detectable in upper and lower  respiratory specimens during the acute phase of infection. The lowest  concentration of SARS-CoV-2 viral copies this assay can detect is 250  copies / mL. A negative result does not preclude SARS-CoV-2 infection  and should not be used as the sole basis for treatment or other  patient management decisions.  A negative result may occur with  improper specimen collection / handling, submission of specimen other  than nasopharyngeal swab, presence of viral mutation(s) within the  areas targeted by this assay, and inadequate number of viral copies  (<250 copies / mL). A negative result must be combined with clinical  observations, patient history, and epidemiological information. If result is POSITIVE SARS-CoV-2 target nucleic acids are DETECTED. The SARS-CoV-2 RNA is generally detectable in upper and lower  respiratory specimens dur ing the acute phase of infection.  Positive  results are indicative of active infection with SARS-CoV-2.  Clinical  correlation with patient history and other diagnostic information is  necessary to determine patient infection status.  Positive results do  not rule out bacterial infection or co-infection with other viruses. If result is PRESUMPTIVE POSTIVE  SARS-CoV-2 nucleic acids MAY BE PRESENT.   A presumptive positive result was obtained on the submitted specimen  and confirmed on repeat testing.  While 2019 novel coronavirus  (SARS-CoV-2) nucleic acids may be present in the submitted sample  additional confirmatory testing may be necessary for epidemiological  and / or clinical management purposes  to differentiate between  SARS-CoV-2 and other Sarbecovirus currently known to infect humans.  If clinically indicated additional testing with an alternate test  methodology [id number]) is advised. The SARS-CoV-2 RNA is generally  detectable in upper and lower respiratory sp ecimens during the acute  phase of infection. The expected result is Negative. Fact Sheet for Patients:  StrictlyIdeas.no Fact Sheet for Healthcare Providers: BankingDealers.co.za This test is not yet approved or cleared by the [place] FDA and has been authorized for detection and/or diagnosis of SARS-CoV-2 by FDA under an Emergency Use Authorization (EUA).  This EUA will remain in effect (meaning this test can be used) for the duration of the COVID-19 declaration under Section 564(b)(1) of the Act, 21 U.S.C. section 360bbb-3(b)(1), unless the authorization is terminated or revoked sooner. Performed at [facility], [place] [address] [place], [place] [address]   Urine culture     Status: None   Collection Time: 12/20/18  5:47 PM   Specimen: Urine, Random  Result Value Ref Range Status   Specimen Description URINE, RANDOM  Final   Special Requests NONE  Final   Culture   Final    NO GROWTH Performed at [facility], [place] [address], [place], [place] [address]    Report Status 12/21/2018 FINAL  Final  Blood culture (routine x 2)     Status: None (Preliminary result)   Collection Time: 12/20/18  5:50 PM   Specimen: BLOOD  Result Value Ref Range Status   Specimen Description BLOOD RIGHT ANTECUBITAL   Final   Special Requests   Final    BOTTLES DRAWN AEROBIC AND ANAEROBIC Blood Culture results may not be optimal due to an inadequate volume of blood received in culture bottles   Culture  Setup Time   Final    GRAM POSITIVE COCCI IN BOTH AEROBIC AND ANAEROBIC BOTTLES CRITICAL RESULT CALLED TO, READ BACK BY AND VERIFIED WITH: PHARMD L  [id number] AT 31 AM BY CM Performed at [facility], [place] [address], [place], [place] [address]    Culture GRAM POSITIVE COCCI  Final   Report Status PENDING  Incomplete  Blood Culture ID Panel (Reflexed)     Status: Abnormal   Collection Time: 12/20/18  5:50 PM  Result Value Ref Range Status   Enterococcus species NOT DETECTED NOT DETECTED Final   Listeria monocytogenes NOT DETECTED NOT DETECTED Final   Staphylococcus species NOT DETECTED NOT DETECTED Final   Staphylococcus aureus (BCID) NOT DETECTED NOT DETECTED Final   Streptococcus species DETECTED (A) NOT DETECTED Final    Comment: CRITICAL RESULT CALLED TO, READ BACK BY AND VERIFIED WITH: PHARMD L SEAY [id number] AT 742 AM BY CM    Streptococcus agalactiae DETECTED (A) NOT DETECTED Final    Comment: CRITICAL RESULT CALLED TO, READ BACK BY AND VERIFIED WITH: PHARMD L SEAY [id number] AT 742 AM BY CM    Streptococcus pneumoniae NOT DETECTED NOT DETECTED Final   Streptococcus pyogenes NOT DETECTED NOT DETECTED Final   Acinetobacter baumannii NOT DETECTED NOT DETECTED Final  Enterobacteriaceae species NOT DETECTED NOT DETECTED Final   Enterobacter cloacae complex NOT DETECTED NOT DETECTED Final   Escherichia coli NOT DETECTED NOT DETECTED Final   Klebsiella oxytoca NOT DETECTED NOT DETECTED Final   Klebsiella pneumoniae NOT DETECTED NOT DETECTED Final   Proteus species NOT DETECTED NOT DETECTED Final   Serratia marcescens NOT DETECTED NOT DETECTED Final   Haemophilus influenzae NOT DETECTED NOT DETECTED Final   Neisseria meningitidis NOT DETECTED NOT DETECTED Final   Pseudomonas aeruginosa NOT  DETECTED NOT DETECTED Final   Candida albicans NOT DETECTED NOT DETECTED Final   Candida glabrata NOT DETECTED NOT DETECTED Final   Candida krusei NOT DETECTED NOT DETECTED Final   Candida parapsilosis NOT DETECTED NOT DETECTED Final   Candida tropicalis NOT DETECTED NOT DETECTED Final    Comment: Performed at [facility], [place] [address], [place], [place] [address]  Blood culture (routine x 2)     Status: None (Preliminary result)   Collection Time: 12/20/18  5:57 PM   Specimen: BLOOD RIGHT WRIST  Result Value Ref Range Status   Specimen Description BLOOD RIGHT WRIST  Final   Special Requests   Final    BOTTLES DRAWN AEROBIC AND ANAEROBIC Blood Culture results may not be optimal due to an inadequate volume of blood received in culture bottles   Culture  Setup Time   Final    GRAM POSITIVE COCCI IN BOTH AEROBIC AND ANAEROBIC BOTTLES CRITICAL RESULT CALLED TO, READ BACK BY AND VERIFIED WITH: PHARMD L SEAY [id number] AT 27 AM BY CM Performed at [facility], [place] [address], [place], [place] [address]    Culture [facility] POSITIVE COCCI  Final   Report Status PENDING  Incomplete    Michel Bickers, MD [facility] for [facility] [phone] pager   [phone] cell 12/21/2018, 4:16 PM

## 2018-12-21 NOTE — Progress Notes (Signed)
  Echocardiogram 2D Echocardiogram has been performed.  Lannie Heaps G Sybella Harnish 12/21/2018, 3:30 PM

## 2018-12-21 NOTE — Telephone Encounter (Signed)
I see, has group B strep Bacteremia

## 2018-12-22 ENCOUNTER — Ambulatory Visit: Payer: Medicare HMO

## 2018-12-22 DIAGNOSIS — Z886 Allergy status to analgesic agent status: Secondary | ICD-10-CM

## 2018-12-22 DIAGNOSIS — J449 Chronic obstructive pulmonary disease, unspecified: Secondary | ICD-10-CM

## 2018-12-22 LAB — GLUCOSE, CAPILLARY
Glucose-Capillary: 194 mg/dL — ABNORMAL HIGH (ref 70–99)
Glucose-Capillary: 175 mg/dL — ABNORMAL HIGH (ref 70–99)
Glucose-Capillary: 220 mg/dL — ABNORMAL HIGH (ref 70–99)
Glucose-Capillary: 252 mg/dL — ABNORMAL HIGH (ref 70–99)

## 2018-12-22 LAB — BASIC METABOLIC PANEL
Anion gap: 6 (ref 5–15)
BUN: 11 mg/dL (ref 8–23)
CO2: 24 mmol/L (ref 22–32)
Calcium: 8.5 mg/dL — ABNORMAL LOW (ref 8.9–10.3)
Chloride: 105 mmol/L (ref 98–111)
Creatinine, Ser: 0.69 mg/dL (ref 0.44–1.00)
GFR calc Af Amer: >60 mL/min (ref >60)
GFR calc non Af Amer: >60 mL/min (ref >60)
Glucose, Bld: 197 mg/dL — ABNORMAL HIGH (ref 70–99)
Potassium: 4.1 mmol/L (ref 3.5–5.1)
Sodium: 135 mmol/L (ref 135–145)

## 2018-12-22 LAB — CBC
HCT: 32.4 % — ABNORMAL LOW (ref 36.0–46.0)
Hemoglobin: 10.6 g/dL — ABNORMAL LOW (ref 12.0–15.0)
MCH: 29.5 pg (ref 26.0–34.0)
MCHC: 32.7 g/dL (ref 30.0–36.0)
MCV: 90.3 fL (ref 80.0–100.0)
Platelets: 218 10*3/uL (ref 150–400)
RBC: 3.59 MIL/uL — ABNORMAL LOW (ref 3.87–5.11)
RDW: 14.1 % (ref 11.5–15.5)
WBC: 12.8 10*3/uL — ABNORMAL HIGH (ref 4.0–10.5)
nRBC: 0 % (ref 0.0–0.2)

## 2018-12-22 LAB — HEMOGLOBIN A1C
Hgb A1c MFr Bld: 10.9 % — ABNORMAL HIGH (ref 4.8–5.6)
Mean Plasma Glucose: 266 mg/dL

## 2018-12-22 MED ORDER — LINEZOLID 600 MG PO TABS
600.0000 mg | ORAL_TABLET | Freq: Two times a day (BID) | ORAL | Status: DC
  Administered 2018-12-22 – 2018-12-23 (×3): 600 mg via ORAL
  Filled 2018-12-22 (×4): qty 1

## 2018-12-22 MED ORDER — LINEZOLID 600 MG PO TABS: 600.0000 mg | ORAL_TABLET | Freq: Two times a day (BID) | ORAL | 0 refills | Status: AC

## 2018-12-22 MED FILL — LINEZOLID 600 MG TABS: 600 | 5 days supply | Qty: 10 | Fill #0

## 2018-12-22 NOTE — Progress Notes (Addendum)
Inpatient Diabetes Program Recommendations  AACE/ADA: New Consensus Statement on Inpatient Glycemic Control (2015)  Target Ranges:  Prepandial:   less than 140 mg/dL      Peak postprandial:   less than 180 mg/dL (1-2 hours)      Critically ill patients:  140 - 180 mg/dL   Results for Meredith Gutierrez, Meredith Gutierrez (MRN [id number]) as of 12/22/2018 14:00  Ref. Range 12/21/2018 06:59 12/21/2018 11:04 12/21/2018 15:20 12/21/2018 21:23  Glucose-Capillary Latest Ref Range: 70 - 99 mg/dL 160 (H) 134 (H) 146 (H) 235 (H)   Results for Meredith Gutierrez, Meredith Gutierrez (MRN [id number]) as of 12/22/2018 14:00  Ref. Range 04/20/2018 08:51 12/21/2018 04:27  Hemoglobin A1C Latest Ref Range: 4.8 - 5.6 % 8.4 (H) 10.9 (H)     Home DM Meds: Metformin XR 500 mg Daily  Current Orders: Novolog Sensitive Correction Scale/ SSI (0-9 units) TID AC     MD- Spoke w/ pt daughter at home by phone (see note below for details of conversation).  Pt forgets she ate at home and tending to double up on meals at home (per daughter's report).  Daughter trying to work on this at home.    May consider increasing pt's home dose of Metformin to 500 mg BID at home at time of d/c.      CBGs well controlled in hospital.  Requiring very little Novolog SSI.  Called and spoke w/ pt's daughter about pt's elevated A1c of 10.9% (significant rise since last A1c drawn).  Pt's dtr told me pt lives in an apartment in pt's daughter's house.  Pt's dtr gives pt all her meds and also has extra care for pt when she (the daughter) is not there.  Pt's dtr told me pt was just started on Metformin once a day back in March.  Dtr checks pt's CBGs at home and sees elevated readings.  Dtr told me that her Mom tends to eat all day (pt forgets that she's eaten and will double up on meals sometimes).  Dtr told me she really tries to hard to keep Mom (the pt) on a meal schedule but stated she can't supervise her all day and that her Mom (the pt) will snack frequently as well.    Discussed  with pt's dtr that pt's CBGs have been well controlled in the hospital likely due to the fact that her meals are all supervised and planned out.  Discussed with dtr that given pt's age and other co-morbidities, an A1c of closer to 8% would likely be appropriate (to prevent severe hyperglycemia and also to prevent hypoglycemia).  Discussed with pt's dtr that the MD may increase pt's dose of Metformin to BID at tie of d/c but that if the MD here in the hospital does not make any adjustments that they should seek follow up care with pt's PCP soon after d/c.  Pt's dtr told me she will try to better supervise the pt's food intake.  Did not have any additional questions for me and thanked me for the info provided.     --Will follow patient during hospitalization--  Wyn Quaker RN, MSN, CDE Diabetes Coordinator Inpatient Glycemic Control Team Team Pager: [phone] (8a-5p)

## 2018-12-22 NOTE — TOC Transition Note (Signed)
Transition of Care [facility]) - CM/SW Discharge Note   Patient Details  Name: Meredith Gutierrez MRN: [id number] Date of Birth: 10-31-1940  Transition of Care [facility]) CM/SW Contact:  Pollie Friar, RN Phone Number: 12/22/2018, 3:16 PM   Clinical Narrative:    Pt is from home with daughter and caregivers. Plan is for her to return home. Has medications from TOC at the bedside. Daughter to provide transport home.    Final next level of care: Home/Self Care Barriers to Discharge: No Barriers Identified   Patient Goals and CMS Choice        Discharge Placement                       Discharge Plan and Services                                     Social Determinants of Health (SDOH) Interventions     Readmission Risk Interventions No flowsheet data found.

## 2018-12-22 NOTE — TOC Initial Note (Signed)
Transition of Care [facility]) - Initial/Assessment Note    Patient Details  Name: Meredith Gutierrez MRN: [id number] Date of Birth: 1941-02-19  Transition of Care [facility]) CM/SW Contact:    Pollie Friar, RN Phone Number: 12/22/2018, 3:16 PM  Clinical Narrative:                   Expected Discharge Plan: Home/Self Care Barriers to Discharge: No Barriers Identified   Patient Goals and CMS Choice        Expected Discharge Plan and Services Expected Discharge Plan: Home/Self Care       Living arrangements for the past 2 months: [place]                                      Prior Living Arrangements/Services Living arrangements for the past 2 months: Single Family Home Lives with:: Adult Children Patient language and need for interpreter reviewed:: Yes(no needs) Do you feel safe going back to the place where you live?: Yes      Need for Family Participation in Patient Care: Yes (Comment) Care giver support system in place?: Yes (comment)(pt has 24 hour caregivers)   Criminal Activity/Legal Involvement Pertinent to Current Situation/Hospitalization: No - Comment as needed  Activities of Daily Living Home Assistive Devices/Equipment: None ADL Screening (condition at time of admission) Patient's cognitive ability adequate to safely complete daily activities?: No Is the patient deaf or have difficulty hearing?: No Does the patient have difficulty seeing, even when wearing glasses/contacts?: No Does the patient have difficulty concentrating, remembering, or making decisions?: Yes Patient able to express need for assistance with ADLs?: No Does the patient have difficulty dressing or bathing?: Yes Independently performs ADLs?: No Communication: Needs assistance Is this a change from baseline?: Pre-admission baseline Dressing (OT): Independent Grooming: Independent Feeding: Independent Bathing: Needs assistance Is this a change from baseline?: Pre-admission  baseline Toileting: Needs assistance Is this a change from baseline?: Pre-admission baseline In/Out Bed: Independent Walks in Home: Needs assistance Is this a change from baseline?: Pre-admission baseline Does the patient have difficulty walking or climbing stairs?: No Weakness of Legs: None Weakness of Arms/Hands: None  Permission Sought/Granted                  Emotional Assessment Appearance:: Appears stated age   Affect (typically observed): Quiet Orientation: : Oriented to Self      Admission diagnosis:  Community acquired pneumonia of left lower lobe of lung ([place]) [J18.1] Patient Active Problem List   Diagnosis Date Noted  . Bacteremia due to group B Streptococcus 12/21/2018  . H/O total hip arthroplasty, left 12/21/2018  . Liver mass 12/20/2018  . Adrenal mass ([place]) 12/20/2018  . Community acquired pneumonia of left lower lobe of lung ([place]) 12/20/2018  . Acute encephalopathy 12/20/2018  . Coronary artery disease involving coronary bypass graft of native heart without angina pectoris 08/19/2018  . Diabetes mellitus type 2 in nonobese ([place]) 08/19/2018  . Hypercholesterolemia 08/19/2018  . Recurrent boils 08/17/2018  . Acute pain of left shoulder 08/17/2018  . Hamartoma ([place]) 08/02/2018  . Atherosclerosis of aorta ([place]) 06/29/2018  . Uncontrolled diabetes mellitus type 2 without complications ([place]) 58/02/9832  . Systolic murmur 82/50/5397  . Hypokalemia 04/20/2018  . COPD (chronic obstructive pulmonary disease) ([place]) 04/20/2018  . Hyponatremia 04/19/2018  . Medicare annual wellness visit, subsequent 04/07/2018  . Chronic nasal congestion 04/03/2018  .  Multiple atypical nevi 02/08/2018  . Genital warts 02/08/2018  . Sundowning 02/08/2018  . Pulmonary nodules 12/13/2017  . Shortness of breath 12/12/2017  . Esophageal dysphagia 09/07/2017  . Health maintenance examination 04/13/2017  . Decreased hearing, bilateral 04/13/2017  . Advanced care  planning/counseling discussion 04/09/2017  . Vitamin D deficiency 02/14/2017  . Fibromuscular dysplasia of renal artery ([place])   . Chronic lower back pain 02/09/2017  . Dementia ([place]) 02/09/2017  . History of melanoma 02/09/2017  . Hyperthyroidism   . Chronic diastolic (congestive) heart failure ([place]) 08/10/2011  . Dyslipidemia, statin intol. 08/07/2011  . Sinus bradycardia, beta blocker intol. 08/07/2011  . Chest pain, negative MI, cath with atretic LIMA, and excellent flow down the native LAD,patent grafts otherwise 08/05/2011  . Essential hypertension 08/05/2011  . Ex-smoker 08/05/2011  . Hx of CABG 08/05/2011   PCP:  Ria Bush, MD Pharmacy:   [facility], [place] [address] Phone: [phone] Fax: [place], [place] [address] [place] [address] Phone: [phone] Fax: [phone]     Social Determinants of Health (SDOH) Interventions    Readmission Risk Interventions No flowsheet data found.

## 2018-12-22 NOTE — Care Management Important Message (Signed)
Important Message  Patient Details  Name: Meredith Gutierrez MRN: [id number] Date of Birth: Aug 11, 1940   Medicare Important Message Given:  Yes     Orbie Pyo 12/22/2018, 3:04 PM

## 2018-12-22 NOTE — Progress Notes (Signed)
PROGRESS NOTE    Meredith Gutierrez  URK:[id number] DOB: 18-Jul-1940 DOA: 12/20/2018 PCP: Ria Bush, MD   Brief Narrative: Meredith Gutierrez is a 78 y.o. female with medical history significant of dementia, DM2, HTN, diastolic CHF. She presented secondary to confusion/mental status change and found to have a likely LLL pneumonia with group B strep bacteremia.   Assessment & Plan:   Principal Problem:   Bacteremia due to group B Streptococcus Active Problems:   Essential hypertension   Hx of CABG   Chronic diastolic (congestive) heart failure (HCC)   Dementia (HCC)   COPD (chronic obstructive pulmonary disease) (HCC)   Recurrent boils   Coronary artery disease involving coronary bypass graft of native heart without angina pectoris   Diabetes mellitus type 2 in nonobese (HCC)   Liver mass   Adrenal mass (HCC)   Acute encephalopathy   H/O total hip arthroplasty, left   Group B strep bacteremia Likely source is pneumonia; no other source can be identified at this time. Strep agalactaie isolated on blood cultures. Urine culture still pending. No vegetations on Transthoracic Echocardiogram. Leukocytosis significantly improved. -Vancomycin per pharmacy -ID consult -Repeat blood cultures pending sensitivities  Sepsis Present on admission. Secondary to above.  LLL pneumonia Possible source for bacteremia. Given Aztreonam and Flagyl in the ED then transitioned to Levaquin for CAP. -Antibiotics as mentioned above  Essential hypertension -Continue irbesartan  Chronic diastolic heart failure -Continue to hold home lasix  Diabetes mellitus, type 2 -Continue SSI  Liver/adrenal mass -Outpatient MRI  Constipation Chronic and in setting of narcotic use. -Continue senokot-S -Miralax  Chronic back pain -Continue home MS Contin and MS IR prn  Acute metabolic encephalopathy Secondary to infection. Improved.  Dementia Stable.  DVT prophylaxis: Lovenox Code Status:    Code Status: Full Code Family Communication: Daughter at bedside Disposition Plan: Discharge home likely in 2-3 days pending workup of bacteremia   Consultants:   Infectious disease  Procedures:   none  Antimicrobials:  Aztreonam  Flagyl  Levaquin  Vancomycin (7/9>>    Subjective: No concerns overnight. Mental status has improved per daughter.  Objective: Vitals:   12/21/18 1959 12/21/18 2322 12/22/18 0344 12/22/18 0857  BP: (!) 86/72 (!) 114/56 118/70 (!) 148/61  Pulse: 89 84 78 79  Resp: 16 15 16  (!) 22  Temp: 98.6 F (37 C) 99 F (37.2 C) 98.9 F (37.2 C) 98.9 F (37.2 C)  TempSrc: Oral Oral Oral Oral  SpO2: 94% 96% 97% 99%  Weight:      Height:        Intake/Output Summary (Last 24 hours) at 12/22/2018 1045 Last data filed at 12/22/2018 0300 Gross per 24 hour  Intake 1770.04 ml  Output --  Net 1770.04 ml   Filed Weights   12/20/18 1702  Weight: 63.5 kg    Examination:  General exam: Appears calm and comfortable Respiratory system: Clear to auscultation. Respiratory effort normal. Cardiovascular system: S1 & S2 heard, RRR. No murmurs, rubs, gallops or clicks. Gastrointestinal system: Abdomen is nondistended, soft and nontender. No organomegaly or masses felt. Normal bowel sounds heard. Central nervous system: Alert Extremities: No edema. No calf tenderness Skin: No cyanosis. No rashes Psychiatry: Judgement and insight appear normal. Mood & affect appropriate.     Data Reviewed: I have personally reviewed following labs and imaging studies  CBC: Recent Labs  Lab 12/20/18 1733 12/20/18 1802 12/21/18 0427 12/22/18 0319  WBC 17.8*  --  26.5* 12.8*  NEUTROABS 16.3*  --   --   --  HGB 13.2 14.3 11.4* 10.6*  HCT 39.6 42.0 33.4* 32.4*  MCV 89.0  --  88.6 90.3  PLT 182  --  238 440   Basic Metabolic Panel: Recent Labs  Lab 12/20/18 1733 12/20/18 1802 12/21/18 0427 12/22/18 0319  NA 130* 131* 131* 135  K 4.0 4.2 3.8 4.1  CL 94*  --   99 105  CO2 21*  --  22 24  GLUCOSE 264*  --  201* 197*  BUN 16  --  18 11  CREATININE 0.95  --  1.00 0.69  CALCIUM 9.2  --  8.4* 8.5*   GFR: Estimated Creatinine Clearance: 47.4 mL/min (by C-G formula based on SCr of 0.69 mg/dL). Liver Function Tests: Recent Labs  Lab 12/20/18 1733  AST 19  ALT 16  ALKPHOS 91  BILITOT 0.7  PROT 7.1  ALBUMIN 3.9   No results for input(s): LIPASE, AMYLASE in the last 168 hours. Recent Labs  Lab 12/20/18 1730  AMMONIA 16   Coagulation Profile: No results for input(s): INR, PROTIME in the last 168 hours. Cardiac Enzymes: No results for input(s): CKTOTAL, CKMB, CKMBINDEX, TROPONINI in the last 168 hours. BNP (last 3 results) Recent Labs    04/18/18 1508  PROBNP 235   HbA1C: Recent Labs    12/21/18 0427  HGBA1C 10.9*   CBG: Recent Labs  Lab 12/21/18 0659 12/21/18 1104 12/21/18 1520 12/21/18 2123 12/22/18 0627  GLUCAP 160* 134* 146* 235* 194*   Lipid Profile: No results for input(s): CHOL, HDL, LDLCALC, TRIG, CHOLHDL, LDLDIRECT in the last 72 hours. Thyroid Function Tests: Recent Labs    12/20/18 1757  TSH 0.534   Anemia Panel: No results for input(s): VITAMINB12, FOLATE, FERRITIN, TIBC, IRON, RETICCTPCT in the last 72 hours. Sepsis Labs: Recent Labs  Lab 12/20/18 1722 12/20/18 2105  LATICACIDVEN 2.8* 2.6*    Recent Results (from the past 240 hour(s))  SARS Coronavirus 2 (CEPHEID- Performed in [facility] hospital lab), Hosp Order     Status: None   Collection Time: 12/20/18  5:14 PM   Specimen: Nasopharyngeal Swab  Result Value Ref Range Status   SARS Coronavirus 2 NEGATIVE NEGATIVE Final    Comment: (NOTE) If result is NEGATIVE SARS-CoV-2 target nucleic acids are NOT DETECTED. The SARS-CoV-2 RNA is generally detectable in upper and lower  respiratory specimens during the acute phase of infection. The lowest  concentration of SARS-CoV-2 viral copies this assay can detect is 250  copies / mL. A negative  result does not preclude SARS-CoV-2 infection  and should not be used as the sole basis for treatment or other  patient management decisions.  A negative result may occur with  improper specimen collection / handling, submission of specimen other  than nasopharyngeal swab, presence of viral mutation(s) within the  areas targeted by this assay, and inadequate number of viral copies  (<250 copies / mL). A negative result must be combined with clinical  observations, patient history, and epidemiological information. If result is POSITIVE SARS-CoV-2 target nucleic acids are DETECTED. The SARS-CoV-2 RNA is generally detectable in upper and lower  respiratory specimens dur ing the acute phase of infection.  Positive  results are indicative of active infection with SARS-CoV-2.  Clinical  correlation with patient history and other diagnostic information is  necessary to determine patient infection status.  Positive results do  not rule out bacterial infection or co-infection with other viruses. If result is PRESUMPTIVE POSTIVE SARS-CoV-2 nucleic acids MAY BE PRESENT.  A presumptive positive result was obtained on the submitted specimen  and confirmed on repeat testing.  While 2019 novel coronavirus  (SARS-CoV-2) nucleic acids may be present in the submitted sample  additional confirmatory testing may be necessary for epidemiological  and / or clinical management purposes  to differentiate between  SARS-CoV-2 and other Sarbecovirus currently known to infect humans.  If clinically indicated additional testing with an alternate test  methodology [phone]) is advised. The SARS-CoV-2 RNA is generally  detectable in upper and lower respiratory sp ecimens during the acute  phase of infection. The expected result is Negative. Fact Sheet for Patients:  StrictlyIdeas.no Fact Sheet for Healthcare Providers: BankingDealers.co.za This test is not yet  approved or cleared by the [place] FDA and has been authorized for detection and/or diagnosis of SARS-CoV-2 by FDA under an Emergency Use Authorization (EUA).  This EUA will remain in effect (meaning this test can be used) for the duration of the COVID-19 declaration under Section 564(b)(1) of the Act, 21 U.S.C. section 360bbb-3(b)(1), unless the authorization is terminated or revoked sooner. Performed at [facility], [place] [address] [place], [place] [address]   Urine culture     Status: None   Collection Time: 12/20/18  5:47 PM   Specimen: Urine, Random  Result Value Ref Range Status   Specimen Description URINE, RANDOM  Final   Special Requests NONE  Final   Culture   Final    NO GROWTH Performed at [facility], [place] [address] [place], [place] [address]    Report Status 12/21/2018 FINAL  Final  Blood culture (routine x 2)     Status: Abnormal (Preliminary result)   Collection Time: 12/20/18  5:50 PM   Specimen: BLOOD  Result Value Ref Range Status   Specimen Description BLOOD RIGHT ANTECUBITAL  Final   Special Requests   Final    BOTTLES DRAWN AEROBIC AND ANAEROBIC Blood Culture results may not be optimal due to an inadequate volume of blood received in culture bottles   Culture  Setup Time   Final    GRAM POSITIVE COCCI IN BOTH AEROBIC AND ANAEROBIC BOTTLES CRITICAL RESULT CALLED TO, READ BACK BY AND VERIFIED WITH: PHARMD L  [id number] AT 60 AM BY CM    Culture (A)  Final    GROUP B STREP(S.AGALACTIAE)ISOLATED SUSCEPTIBILITIES TO FOLLOW Performed at [facility], [place] [address], [place], [place] [address]    Report Status PENDING  Incomplete  Blood Culture ID Panel (Reflexed)     Status: Abnormal   Collection Time: 12/20/18  5:50 PM  Result Value Ref Range Status   Enterococcus species NOT DETECTED NOT DETECTED Final   Listeria monocytogenes NOT DETECTED NOT DETECTED Final   Staphylococcus species NOT DETECTED NOT DETECTED Final    Staphylococcus aureus (BCID) NOT DETECTED NOT DETECTED Final   Streptococcus species DETECTED (A) NOT DETECTED Final    Comment: CRITICAL RESULT CALLED TO, READ BACK BY AND VERIFIED WITH: PHARMD L SEAY [id number] AT 742 AM BY CM    Streptococcus agalactiae DETECTED (A) NOT DETECTED Final    Comment: CRITICAL RESULT CALLED TO, READ BACK BY AND VERIFIED WITH: PHARMD L SEAY [id number] AT 742 AM BY CM    Streptococcus pneumoniae NOT DETECTED NOT DETECTED Final   Streptococcus pyogenes NOT DETECTED NOT DETECTED Final   Acinetobacter baumannii NOT DETECTED NOT DETECTED Final   Enterobacteriaceae species NOT DETECTED NOT DETECTED Final   Enterobacter cloacae complex NOT DETECTED NOT  DETECTED Final   Escherichia coli NOT DETECTED NOT DETECTED Final   Klebsiella oxytoca NOT DETECTED NOT DETECTED Final   Klebsiella pneumoniae NOT DETECTED NOT DETECTED Final   Proteus species NOT DETECTED NOT DETECTED Final   Serratia marcescens NOT DETECTED NOT DETECTED Final   Haemophilus influenzae NOT DETECTED NOT DETECTED Final   Neisseria meningitidis NOT DETECTED NOT DETECTED Final   Pseudomonas aeruginosa NOT DETECTED NOT DETECTED Final   Candida albicans NOT DETECTED NOT DETECTED Final   Candida glabrata NOT DETECTED NOT DETECTED Final   Candida krusei NOT DETECTED NOT DETECTED Final   Candida parapsilosis NOT DETECTED NOT DETECTED Final   Candida tropicalis NOT DETECTED NOT DETECTED Final    Comment: Performed at [facility], [place] [address] [place], [place] [address]  Blood culture (routine x 2)     Status: None (Preliminary result)   Collection Time: 12/20/18  5:57 PM   Specimen: BLOOD RIGHT WRIST  Result Value Ref Range Status   Specimen Description BLOOD RIGHT WRIST  Final   Special Requests   Final    BOTTLES DRAWN AEROBIC AND ANAEROBIC Blood Culture results may not be optimal due to an inadequate volume of blood received in culture bottles   Culture  Setup Time   Final    GRAM POSITIVE  COCCI IN BOTH AEROBIC AND ANAEROBIC BOTTLES CRITICAL RESULT CALLED TO, READ BACK BY AND VERIFIED WITH: PHARMD L SEAY [id number] AT 27 AM BY CM    Culture   Final    BETA HEMOLYTIC ORGANISM BEING IDENTIFIED IDENTIFICATION AND SUSCEPTIBILITIES TO FOLLOW Performed at [facility], [place] [address], [place], [place] [address]    Report Status PENDING  Incomplete         Radiology Studies: Ct Head Wo Contrast  Result Date: 12/20/2018 CLINICAL DATA:  Altered level of consciousness EXAM: CT HEAD WITHOUT CONTRAST TECHNIQUE: Contiguous axial images were obtained from the base of the skull through the vertex without intravenous contrast. COMPARISON:  09/02/2016 FINDINGS: Brain: Chronic atrophic and ischemic changes are again identified similar to that seen on the prior exam. No findings to suggest acute hemorrhage, acute infarction or space-occupying mass lesion are noted. Vascular: No hyperdense vessel or unexpected calcification. Skull: Normal. Negative for fracture or focal lesion. Sinuses/Orbits: No acute finding. Other: None. IMPRESSION: No significant interval change from the prior exam. Chronic atrophic and ischemic changes are seen. No acute abnormality noted. Electronically Signed   By: Inez Catalina M.D.   On: 12/20/2018 20:01   Ct Abdomen Pelvis W Contrast  Result Date: 12/20/2018 CLINICAL DATA:  Abdominal pain with fever. EXAM: CT ABDOMEN AND PELVIS WITH CONTRAST TECHNIQUE: Multidetector CT imaging of the abdomen and pelvis was performed using the standard protocol following bolus administration of intravenous contrast. CONTRAST:  18mL OMNIPAQUE IOHEXOL 300 MG/ML  SOLN COMPARISON:  None. FINDINGS: Lower chest: There is a left lower lobe airspace opacity. The heart size is enlarged.There is no significant pericardial effusion. Hepatobiliary: There is an indeterminate 3.4 cm hypoattenuating mass in hepatic segment 4A/B. The gallbladder is distended without CT evidence for acute  cholecystitis.The common bile duct is dilated measuring approximately 8 mm. Pancreas: The pancreas is somewhat atrophic. The pancreatic duct is dilated. Multiple calcifications are noted in the pancreas, including in the expected region of the distal pancreatic duct. Spleen: There is a 9 mm hypoattenuating nodule in the lower pole of the spleen. There are additional scattered hypoattenuating nodules throughout the remaining portions of the spleen.  The spleen is borderline enlarged measuring approximately 11 cm. Adrenals/Urinary Tract: --Adrenal glands: There is a 2.6 cm indeterminate left adrenal mass. --Right kidney/ureter: No hydronephrosis or perinephric hematoma. --Left kidney/ureter: No hydronephrosis or perinephric hematoma. --Urinary bladder: Unremarkable. Stomach/Bowel: --Stomach/Duodenum: No hiatal hernia or other gastric abnormality. Normal duodenal course and caliber. --Small bowel: No dilatation or inflammation. --Colon: There is a large amount of stool throughout the colon. --Appendix: Not visualized. No right lower quadrant inflammation or free fluid. Vascular/Lymphatic: Atherosclerotic calcification is present within the non-aneurysmal abdominal aorta, without hemodynamically significant stenosis. --No retroperitoneal lymphadenopathy. --No mesenteric lymphadenopathy. --No pelvic or inguinal lymphadenopathy. Reproductive: Unremarkable Other: No ascites or free air. The abdominal wall is normal. Musculoskeletal. Multilevel degenerative disc disease and facet arthrosis. No bony spinal canal stenosis. IMPRESSION: 1. Left lower lobe airspace opacity concerning for a developing pneumonia. 2. Indeterminate 3.4 cm mass in hepatic segment 4. Follow-up with a nonemergent outpatient liver mass protocol MRI is recommended. 3. Distended gallbladder without evidence of acute cholecystitis. 4. Dilated common bile duct and pancreatic duct. There are multiple calcifications in the pancreas, some of which appear to be  centered within the pancreatic duct. Follow-up with a nonemergent MRCP is recommended for further evaluation. 5. Indeterminate 2.6 cm left adrenal nodule. Follow-up with a nonemergent adrenal mass protocol MRI is recommended. 6. Large amount of stool throughout the colon. No evidence for small bowel obstruction. The appendix is not reliably identified, however there are no significant inflammatory changes in the right lower quadrant. Aortic Atherosclerosis (ICD10-I70.0). Electronically Signed   By: Constance Holster M.D.   On: 12/20/2018 21:02   Dg Chest Port 1 View  Result Date: 12/20/2018 CLINICAL DATA:  Altered mental status EXAM: PORTABLE CHEST 1 VIEW COMPARISON:  December 10, 2017 FINDINGS: The patient is status post prior median sternotomy. The cardiac silhouette remains enlarged. There are low lung volumes. There appears to be atelectasis at the left lung base with a small left-sided pleural effusion. No pneumothorax. There are advanced degenerative changes of the right glenohumeral joint. IMPRESSION: 1. Status post prior CABG.  Stable cardiac enlargement. 2. Opacification at the left lung base favored to represent a combination of both atelectasis and a small left-sided pleural effusion. Electronically Signed   By: Constance Holster M.D.   On: 12/20/2018 18:47        Scheduled Meds:  aspirin EC  81 mg Oral Daily   donepezil  5 mg Oral QHS   enoxaparin (LOVENOX) injection  40 mg Subcutaneous Q24H   insulin aspart  0-9 Units Subcutaneous TID WC   irbesartan  150 mg Oral Daily   LORazepam  0.5-1 mg Oral Q24H   morphine  15 mg Oral 2 times per day   polyethylene glycol  17 g Oral BID   senna-docusate  1 tablet Oral BID   Continuous Infusions:  sodium chloride 20 mL/hr at 12/22/18 0300   vancomycin 750 mg (12/22/18 0929)     LOS: 2 days     Cordelia Poche, MD [facility] 12/22/2018, 10:45 AM  If 7PM-7AM, please contact night-coverage [email or web address]

## 2018-12-22 NOTE — Progress Notes (Signed)
[facility] for Infectious Disease  Date of Admission:  12/20/2018      Total days of antibiotics 2  Day 2 vancomycin           ASSESSMENT: Ms. Kishbaugh had a good night.  Her daughter [name] stayed to help with her orientation and care given her baseline dementia.  She has had a full and prompt response after only a short time on antibiotics for her group B Streptococcus bacteremia.  Her transthoracic echo revealed no concerning signs for endocarditis, her physical exam reveals no concerning signs for endocarditis. Would not pursue TEE for further investigation. Although we do not have a source to explain her bacteremia, given her prompt response to therapy would feel comfortable continuing her treatment with oral antibiotics to complete 7 days total.    We have not been able to verify previous cephalosporin history.  She did have a anaphylactic reaction greater than 10 years ago.  She would be a good candidate for penicillin allergy skin testing in the future in an allergy clinic -could consider that referral at discharge.  For now we will plan on using oral linezolid for 5 more days of therapy.  We can go ahead and start this now and will work with [facility] to bring meds to bedside prior to discharge.    PLAN: 1. Start linezolid 600 mg twice daily. 2. Stop vancomycin 3. Consider outpatient allergy consultation for penicillin skin testing   Principal Problem:   Bacteremia due to group B Streptococcus Active Problems:   Essential hypertension   Hx of CABG   Chronic diastolic (congestive) heart failure (HCC)   Dementia (HCC)   COPD (chronic obstructive pulmonary disease) (HCC)   Recurrent boils   Coronary artery disease involving coronary bypass graft of native heart without angina pectoris   Diabetes mellitus type 2 in nonobese (HCC)   Liver mass   Adrenal mass (HCC)   Acute encephalopathy   H/O total hip arthroplasty, left   . aspirin EC  81 mg Oral  Daily  . donepezil  5 mg Oral QHS  . enoxaparin (LOVENOX) injection  40 mg Subcutaneous Q24H  . insulin aspart  0-9 Units Subcutaneous TID WC  . irbesartan  150 mg Oral Daily  . LORazepam  0.5-1 mg Oral Q24H  . morphine  15 mg Oral 2 times per day  . polyethylene glycol  17 g Oral BID  . senna-docusate  1 tablet Oral BID    SUBJECTIVE: Ms. Rozeboom is currently asleep.  She has not had any breakfast or anything to eat since after midnight due to preparation of TEE consideration.  Her daughter [name] provided her history today.  She said she had a very good night last night but has noticed some more confusion that she is used to.  She attributes this to the being in an unfamiliar environment.  Otherwise reports that her mother has returned to 100% her baseline.  No new symptoms this morning.  Review of Systems: Review of Systems  Unable to perform ROS: Other  Patient is asleep   Allergies  Allergen Reactions  . Codeine Anaphylaxis and Shortness Of Breath  . Penicillin G Shortness Of Breath and Rash    Did it involve swelling of the face/tongue/throat, SOB, or low BP? Yes Did it involve sudden or severe rash/hives, skin peeling, or any reaction on the inside of your mouth or nose? No Did you need to seek  medical attention at a hospital or doctor's office? No When did it last happen?More than 10 years ago- since 2020 If all above answers are "NO", may proceed with cephalosporin use.   [facility] Penicillins Anaphylaxis and Shortness Of Breath    Has patient had a PCN reaction causing immediate rash, facial/tongue/throat swelling, SOB or lightheadedness with hypotension: YES Has patient had a PCN reaction causing severe rash involving mucus membranes or skin necrosis: NO Has patient had a PCN reaction that required hospitalization: NO Has patient had a PCN reaction occurring within the last 10 years: NO If all of the above answers are "NO", then may proceed with Cephalosporin use.  .  Prednisolone Acetate Shortness Of Breath and Nausea Only  . Prednisone Hives, Shortness Of Breath and Swelling  . Meloxicam Hives  . Nitroglycerin Other (See Comments)    Reaction not recalled   . Adhesive [Tape] Rash and Other (See Comments)    SKIN IS VERY SENSITIVE- TEARS AND BRUISES EASILY, also  . Crestor [Rosuvastatin Calcium] Other (See Comments)    Weakness with statins  . Lopressor [Metoprolol Tartrate] Other (See Comments)    Bradycardia   . Sulfamethoxazole Hives and Swelling  . Zetia [Ezetimibe] Other (See Comments)    Weakness     OBJECTIVE: Vitals:   12/21/18 1959 12/21/18 2322 12/22/18 0344 12/22/18 0857  BP: (!) 86/72 (!) 114/56 118/70 (!) 148/61  Pulse: 89 84 78 79  Resp: 16 15 16  (!) 22  Temp: 98.6 F (37 C) 99 F (37.2 C) 98.9 F (37.2 C) 98.9 F (37.2 C)  TempSrc: Oral Oral Oral Oral  SpO2: 94% 96% 97% 99%  Weight:      Height:       Body mass index is 27.34 kg/m.  Physical Exam Constitutional:      Comments: Patient sleeping soundly.  Cardiovascular:     Rate and Rhythm: Normal rate.  Pulmonary:     Effort: Pulmonary effort is normal.  Skin:    General: Skin is warm.     Comments: Varying bruises along bilateral forearms in various stages of healing.  Otherwise no signs of new rash or skin lesions.     Lab Results Lab Results  Component Value Date   WBC 12.8 (H) 12/22/2018   HGB 10.6 (L) 12/22/2018   HCT 32.4 (L) 12/22/2018   MCV 90.3 12/22/2018   PLT 218 12/22/2018    Lab Results  Component Value Date   CREATININE 0.69 12/22/2018   BUN 11 12/22/2018   NA 135 12/22/2018   K 4.1 12/22/2018   CL 105 12/22/2018   CO2 24 12/22/2018    Lab Results  Component Value Date   ALT 16 12/20/2018   AST 19 12/20/2018   ALKPHOS 91 12/20/2018   BILITOT 0.7 12/20/2018     Microbiology: Recent Results (from the past 240 hour(s))  SARS Coronavirus 2 (CEPHEID- Performed in [facility] lab), Hosp Order     Status: None    Collection Time: 12/20/18  5:14 PM   Specimen: Nasopharyngeal Swab  Result Value Ref Range Status   SARS Coronavirus 2 NEGATIVE NEGATIVE Final    Comment: (NOTE) If result is NEGATIVE SARS-CoV-2 target nucleic acids are NOT DETECTED. The SARS-CoV-2 RNA is generally detectable in upper and lower  respiratory specimens during the acute phase of infection. The lowest  concentration of SARS-CoV-2 viral copies this assay can detect is 250  copies / mL. A negative result does not preclude SARS-CoV-2 infection  and should not be used as the sole basis for treatment or other  patient management decisions.  A negative result may occur with  improper specimen collection / handling, submission of specimen other  than nasopharyngeal swab, presence of viral mutation(s) within the  areas targeted by this assay, and inadequate number of viral copies  (<250 copies / mL). A negative result must be combined with clinical  observations, patient history, and epidemiological information. If result is POSITIVE SARS-CoV-2 target nucleic acids are DETECTED. The SARS-CoV-2 RNA is generally detectable in upper and lower  respiratory specimens dur ing the acute phase of infection.  Positive  results are indicative of active infection with SARS-CoV-2.  Clinical  correlation with patient history and other diagnostic information is  necessary to determine patient infection status.  Positive results do  not rule out bacterial infection or co-infection with other viruses. If result is PRESUMPTIVE POSTIVE SARS-CoV-2 nucleic acids MAY BE PRESENT.   A presumptive positive result was obtained on the submitted specimen  and confirmed on repeat testing.  While 2019 novel coronavirus  (SARS-CoV-2) nucleic acids may be present in the submitted sample  additional confirmatory testing may be necessary for epidemiological  and / or clinical management purposes  to differentiate between  SARS-CoV-2 and other Sarbecovirus  currently known to infect humans.  If clinically indicated additional testing with an alternate test  methodology [phone]) is advised. The SARS-CoV-2 RNA is generally  detectable in upper and lower respiratory sp ecimens during the acute  phase of infection. The expected result is Negative. Fact Sheet for Patients:  StrictlyIdeas.no Fact Sheet for Healthcare Providers: BankingDealers.co.za This test is not yet approved or cleared by the [place] FDA and has been authorized for detection and/or diagnosis of SARS-CoV-2 by FDA under an Emergency Use Authorization (EUA).  This EUA will remain in effect (meaning this test can be used) for the duration of the COVID-19 declaration under Section 564(b)(1) of the Act, 21 U.S.C. section 360bbb-3(b)(1), unless the authorization is terminated or revoked sooner. Performed at [facility], [place] [address], [place], [place] [address]   Urine culture     Status: None   Collection Time: 12/20/18  5:47 PM   Specimen: Urine, Random  Result Value Ref Range Status   Specimen Description URINE, RANDOM  Final   Special Requests NONE  Final   Culture   Final    NO GROWTH Performed at [facility], [place] [address] [place], [place] [address]    Report Status 12/21/2018 FINAL  Final  Blood culture (routine x 2)     Status: Abnormal (Preliminary result)   Collection Time: 12/20/18  5:50 PM   Specimen: BLOOD  Result Value Ref Range Status   Specimen Description BLOOD RIGHT ANTECUBITAL  Final   Special Requests   Final    BOTTLES DRAWN AEROBIC AND ANAEROBIC Blood Culture results may not be optimal due to an inadequate volume of blood received in culture bottles   Culture  Setup Time   Final    GRAM POSITIVE COCCI IN BOTH AEROBIC AND ANAEROBIC BOTTLES CRITICAL RESULT CALLED TO, READ BACK BY AND VERIFIED WITH: PHARMD L  [id number] AT 76 AM BY CM    Culture (A)  Final    GROUP B  STREP(S.AGALACTIAE)ISOLATED SUSCEPTIBILITIES TO FOLLOW Performed at [facility], [place] [address] [place], [place] [address]    Report Status PENDING  Incomplete  Blood Culture ID Panel (Reflexed)  Status: Abnormal   Collection Time: 12/20/18  5:50 PM  Result Value Ref Range Status   Enterococcus species NOT DETECTED NOT DETECTED Final   Listeria monocytogenes NOT DETECTED NOT DETECTED Final   Staphylococcus species NOT DETECTED NOT DETECTED Final   Staphylococcus aureus (BCID) NOT DETECTED NOT DETECTED Final   Streptococcus species DETECTED (A) NOT DETECTED Final    Comment: CRITICAL RESULT CALLED TO, READ BACK BY AND VERIFIED WITH: PHARMD L SEAY [id number] AT 742 AM BY CM    Streptococcus agalactiae DETECTED (A) NOT DETECTED Final    Comment: CRITICAL RESULT CALLED TO, READ BACK BY AND VERIFIED WITH: PHARMD L SEAY [id number] AT 742 AM BY CM    Streptococcus pneumoniae NOT DETECTED NOT DETECTED Final   Streptococcus pyogenes NOT DETECTED NOT DETECTED Final   Acinetobacter baumannii NOT DETECTED NOT DETECTED Final   Enterobacteriaceae species NOT DETECTED NOT DETECTED Final   Enterobacter cloacae complex NOT DETECTED NOT DETECTED Final   Escherichia coli NOT DETECTED NOT DETECTED Final   Klebsiella oxytoca NOT DETECTED NOT DETECTED Final   Klebsiella pneumoniae NOT DETECTED NOT DETECTED Final   Proteus species NOT DETECTED NOT DETECTED Final   Serratia marcescens NOT DETECTED NOT DETECTED Final   Haemophilus influenzae NOT DETECTED NOT DETECTED Final   Neisseria meningitidis NOT DETECTED NOT DETECTED Final   Pseudomonas aeruginosa NOT DETECTED NOT DETECTED Final   Candida albicans NOT DETECTED NOT DETECTED Final   Candida glabrata NOT DETECTED NOT DETECTED Final   Candida krusei NOT DETECTED NOT DETECTED Final   Candida parapsilosis NOT DETECTED NOT DETECTED Final   Candida tropicalis NOT DETECTED NOT DETECTED Final    Comment: Performed at [facility], [address] [place], [place] [address]  Blood culture (routine x 2)     Status: None (Preliminary result)   Collection Time: 12/20/18  5:57 PM   Specimen: BLOOD RIGHT WRIST  Result Value Ref Range Status   Specimen Description BLOOD RIGHT WRIST  Final   Special Requests   Final    BOTTLES DRAWN AEROBIC AND ANAEROBIC Blood Culture results may not be optimal due to an inadequate volume of blood received in culture bottles   Culture  Setup Time   Final    GRAM POSITIVE COCCI IN BOTH AEROBIC AND ANAEROBIC BOTTLES CRITICAL RESULT CALLED TO, READ BACK BY AND VERIFIED WITH: PHARMD L SEAY [id number] AT 62 AM BY CM    Culture   Final    BETA HEMOLYTIC ORGANISM BEING IDENTIFIED IDENTIFICATION AND SUSCEPTIBILITIES TO FOLLOW Performed at [facility], [place] [address] [place], [place] [address]    Report Status PENDING  Incomplete     Janene Madeira, MSN, NP-C [facility] [email or web address] Pager: [phone] Office: [phone] [place]: [phone]

## 2018-12-22 NOTE — Plan of Care (Signed)
  Problem: Education: Goal: Knowledge of General Education information will improve Description: Including pain rating scale, medication(s)/side effects and non-pharmacologic comfort measures Outcome: Progressing   Problem: Health Behavior/Discharge Planning: Goal: Ability to manage health-related needs will improve Outcome: Progressing   Problem: Clinical Measurements: Goal: Ability to maintain clinical measurements within normal limits will improve Outcome: Progressing Goal: Will remain free from infection Outcome: Progressing Note: Pt has shown no new signs of infection during my care.  Goal: Diagnostic test results will improve Outcome: Progressing Goal: Respiratory complications will improve Outcome: Progressing Goal: Cardiovascular complication will be avoided Outcome: Progressing   Problem: Activity: Goal: Risk for activity intolerance will decrease Outcome: Progressing   Problem: Nutrition: Goal: Adequate nutrition will be maintained Outcome: Progressing Note: Pt was NPO during my breakfast but was able to eat lunch during my shift.    Problem: Coping: Goal: Level of anxiety will decrease Outcome: Progressing   Problem: Elimination: Goal: Will not experience complications related to bowel motility Outcome: Progressing Goal: Will not experience complications related to urinary retention Outcome: Progressing   Problem: Pain Managment: Goal: General experience of comfort will improve Outcome: Progressing Note: Pt has not complained of pain during my care.    Problem: Safety: Goal: Ability to remain free from injury will improve Outcome: Progressing   Problem: Skin Integrity: Goal: Risk for impaired skin integrity will decrease Outcome: Progressing

## 2018-12-23 DIAGNOSIS — Z23 Encounter for immunization: Secondary | ICD-10-CM | POA: Diagnosis not present

## 2018-12-23 LAB — CULTURE, BLOOD (ROUTINE X 2)

## 2018-12-23 LAB — GLUCOSE, CAPILLARY: Glucose-Capillary: 194 mg/dL — ABNORMAL HIGH (ref 70–99)

## 2018-12-23 MED ORDER — POLYETHYLENE GLYCOL 3350 17 G PO PACK: 17.0000 g | PACK | Freq: Every day | ORAL | 0 refills | Status: DC | PRN

## 2018-12-23 MED ORDER — SENNOSIDES-DOCUSATE SODIUM 8.6-50 MG PO TABS: 1.0000 | ORAL_TABLET | Freq: Every day | ORAL | Status: DC

## 2018-12-23 NOTE — Discharge Summary (Signed)
Physician Discharge Summary  Meredith Gutierrez VQQ:[id number] DOB: [birth year] DOA: 12/20/2018  PCP: Ria Bush, MD  Admit date: 12/20/2018 Discharge date: 12/23/2018  Admitted From: Home Disposition: Home  Recommendations for Outpatient Follow-up:  1. Follow up with PCP in 1 week 2. Please obtain BMP/CBC in one week 3. Please follow up on the following pending results: None  Home Health: None Equipment/Devices: None  Discharge Condition: Stable CODE STATUS: Full code Diet recommendation: Heart healthy   Brief/Interim Summary:  Admission HPI written by Etta Quill, DO   HPI: Meredith Gutierrez is a 78 y.o. female with medical history significant of dementia, DM2, HTN, diastolic CHF.  Patient presents to ED with AMS.  Not communicating effectively with family since this AM.  States that she had a meal and then next time she checked on her she was not communicative. Was found to later have a fever of 101 axillary. No infectious symptoms per the family.  Has chronic back pain but otherwise not complaining of pain. No cough no shortness of breath no vomiting or diarrhea. No urinary symptoms    Hospital course:  Group B strep bacteremia Likely source is pneumonia; no other source can be identified at this time. Strep agalactaie isolated on blood cultures. Urine culture with no growth. Patient was empirically treated with Levaquin which was transitioned to Vancomycin IV. Infectious disease consulted. No vegetations on Transthoracic Echocardiogram. Leukocytosis significantly improved. Upon sensitivity information, ID recommends discharge on Linezolid to complete antibiotic treatment.  Sepsis Present on admission. Secondary to above.  LLL pneumonia Possible source for bacteremia. Given Aztreonam and Flagyl in the ED then transitioned to Levaquin for CAP. Antibiotics as mentioned above  Essential hypertension Continue irbesartan  Chronic diastolic heart failure  Continue home lasix on discharge  Diabetes mellitus, type 2 Continue home metformin on discharge. Hemoglobin A1C of 10.9% so patient would benefit from adjustment of DM regimen  Liver/adrenal mass Outpatient MRI  Constipation Chronic and in setting of narcotic use. Started on Senokot-S and miralax. Continue on discharge.  Chronic back pain Continue home MS Contin and MS IR prn  Acute metabolic encephalopathy Secondary to infection. Improved.  Dementia Stable.  Discharge Diagnoses:  Principal Problem:   Bacteremia due to group B Streptococcus Active Problems:   Essential hypertension   Hx of CABG   Chronic diastolic (congestive) heart failure (HCC)   Dementia (HCC)   COPD (chronic obstructive pulmonary disease) (HCC)   Recurrent boils   Coronary artery disease involving coronary bypass graft of native heart without angina pectoris   Diabetes mellitus type 2 in nonobese [facility])   Liver mass   Adrenal mass (HCC)   Acute encephalopathy   H/O total hip arthroplasty, left    Discharge Instructions  Discharge Instructions    Diet - low sodium heart healthy   Complete by: As directed    Increase activity slowly   Complete by: As directed      Allergies as of 12/23/2018      Reactions   Codeine Anaphylaxis, Shortness Of Breath   Penicillin G Shortness Of Breath, Rash   Did it involve swelling of the face/tongue/throat, SOB, or low BP? Yes Did it involve sudden or severe rash/hives, skin peeling, or any reaction on the inside of your mouth or nose? No Did you need to seek medical attention at a hospital or doctor's office? No When did it last happen?More than 10 years ago- since 2020 If all above answers are "NO", may  proceed with cephalosporin use.   Penicillins Anaphylaxis, Shortness Of Breath   Has patient had a PCN reaction causing immediate rash, facial/tongue/throat swelling, SOB or lightheadedness with hypotension: YES Has patient had a PCN reaction  causing severe rash involving mucus membranes or skin necrosis: NO Has patient had a PCN reaction that required hospitalization: NO Has patient had a PCN reaction occurring within the last 10 years: NO If all of the above answers are "NO", then may proceed with Cephalosporin use.   Prednisolone Acetate Shortness Of Breath, Nausea Only   Prednisone Hives, Shortness Of Breath, Swelling   Meloxicam Hives   Nitroglycerin Other (See Comments)   Reaction not recalled    Adhesive [tape] Rash, Other (See Comments)   SKIN IS VERY SENSITIVE- TEARS AND BRUISES EASILY, also   Crestor [rosuvastatin Calcium] Other (See Comments)   Weakness with statins   Lopressor [metoprolol Tartrate] Other (See Comments)   Bradycardia   Sulfamethoxazole Hives, Swelling   Zetia [ezetimibe] Other (See Comments)   Weakness      Medication List    TAKE these medications   Accu-Chek Aviva Plus w/Device Kit 1 each by Does not apply route daily. Use as directed to check blood sugar once daily.  Dx code:  E11.65   Accu-Chek FastClix Lancets Misc 1 each by Does not apply route daily. Use as directed to check blood sugar once daily.  Dx code:  E11.65   albuterol (2.5 MG/3ML) 0.083% nebulizer solution Commonly known as: PROVENTIL Take 3 mLs (2.5 mg total) by nebulization every 6 (six) hours as needed for wheezing or shortness of breath.   aspirin EC 81 MG tablet Take 81 mg by mouth daily.   B-D SINGLE USE SWABS REGULAR Pads 1 each by Does not apply route daily. Use as directed to check blood sugar once daily.  Dx code:  E11.65   candesartan 16 MG tablet Commonly known as: ATACAND TAKE 1 TABLET BY MOUTH DAILY   carboxymethylcellulose 0.5 % Soln Commonly known as: REFRESH PLUS Place 1 drop into both eyes 2 (two) times daily as needed (for irritation).   donepezil 5 MG tablet Commonly known as: ARICEPT TAKE ONE TABLET BY MOUTH AT BEDTIME   furosemide 80 MG tablet Commonly known as: LASIX Take 1 tablet by  mouth daily What changed:   how much to take  how to take this  when to take this  additional instructions   glucose blood test strip Commonly known as: Accu-Chek Aviva Plus 1 each by Other route as needed for other. Use as instructed to check blood sugar once daily.  Dx code:  E11.65   linezolid 600 MG tablet Commonly known as: ZYVOX Take 1 tablet (600 mg total) by mouth 2 (two) times daily for 5 days.   LORazepam 0.5 MG tablet Commonly known as: ATIVAN TAKE 1 TABLET BY MOUTH AT BEDTIME AS NEEDED FOR ANXIETY OR SLEEP MAY TAKE SECOND 1/2 TABLET AS NEEDED What changed: See the new instructions.   metFORMIN 500 MG 24 hr tablet Commonly known as: GLUCOPHAGE-XR TAKE 1 TABLET BY MOUTH DAILY AFTER SUPPER What changed: See the new instructions.   morphine 15 MG tablet Commonly known as: MSIR Take 15 mg by mouth See admin instructions. Take 15 mg by mouth four times a day- 6:30 AM, 7:30 AM, 1:00 PM, 3:00 PM   morphine 15 MG 12 hr tablet Commonly known as: MS CONTIN Take 15 mg by mouth See admin instructions. Take 15 mg by mouth two  times a day- 10 AM and 6 PM   polyethylene glycol 17 g packet Commonly known as: MIRALAX / GLYCOLAX Take 17 g by mouth daily as needed.   potassium chloride SA 20 MEQ tablet Commonly known as: K-DUR Take 2 tablets (40 mEq total) by mouth daily. What changed: how much to take   senna-docusate 8.6-50 MG tablet Commonly known as: Senokot-S Take 1 tablet by mouth at bedtime.   sodium chloride 0.65 % Soln nasal spray Commonly known as: OCEAN Place 1 spray into both nostrils as needed for congestion.   Vitamin D 50 MCG (2000 UT) Caps Take 1 capsule (2,000 Units total) by mouth daily.      Follow-up Information    Ria Bush, MD. Schedule an appointment as soon as possible for a visit in 1 week(s).   Specialty: Family Medicine Why: Hospital follow-up Contact information: [place] [address] [phone]           Allergies  Allergen Reactions  . Codeine Anaphylaxis and Shortness Of Breath  . Penicillin G Shortness Of Breath and Rash    Did it involve swelling of the face/tongue/throat, SOB, or low BP? Yes Did it involve sudden or severe rash/hives, skin peeling, or any reaction on the inside of your mouth or nose? No Did you need to seek medical attention at a hospital or doctor's office? No When did it last happen?More than 10 years ago- since 2020 If all above answers are "NO", may proceed with cephalosporin use.   Marland Kitchen Penicillins Anaphylaxis and Shortness Of Breath    Has patient had a PCN reaction causing immediate rash, facial/tongue/throat swelling, SOB or lightheadedness with hypotension: YES Has patient had a PCN reaction causing severe rash involving mucus membranes or skin necrosis: NO Has patient had a PCN reaction that required hospitalization: NO Has patient had a PCN reaction occurring within the last 10 years: NO If all of the above answers are "NO", then may proceed with Cephalosporin use.  . Prednisolone Acetate Shortness Of Breath and Nausea Only  . Prednisone Hives, Shortness Of Breath and Swelling  . Meloxicam Hives  . Nitroglycerin Other (See Comments)    Reaction not recalled   . Adhesive [Tape] Rash and Other (See Comments)    SKIN IS VERY SENSITIVE- TEARS AND BRUISES EASILY, also  . Crestor [Rosuvastatin Calcium] Other (See Comments)    Weakness with statins  . Lopressor [Metoprolol Tartrate] Other (See Comments)    Bradycardia   . Sulfamethoxazole Hives and Swelling  . Zetia [Ezetimibe] Other (See Comments)    Weakness     Consultations:  Infectious disease   Procedures/Studies: Ct Head Wo Contrast  Result Date: 12/20/2018 CLINICAL DATA:  Altered level of consciousness EXAM: CT HEAD WITHOUT CONTRAST TECHNIQUE: Contiguous axial images were obtained from the base of the skull through the vertex without intravenous contrast. COMPARISON:  09/02/2016  FINDINGS: Brain: Chronic atrophic and ischemic changes are again identified similar to that seen on the prior exam. No findings to suggest acute hemorrhage, acute infarction or space-occupying mass lesion are noted. Vascular: No hyperdense vessel or unexpected calcification. Skull: Normal. Negative for fracture or focal lesion. Sinuses/Orbits: No acute finding. Other: None. IMPRESSION: No significant interval change from the prior exam. Chronic atrophic and ischemic changes are seen. No acute abnormality noted. Electronically Signed   By: Inez Catalina M.D.   On: 12/20/2018 20:01   Ct Abdomen Pelvis W Contrast  Result Date: 12/20/2018 CLINICAL DATA:  Abdominal  pain with fever. EXAM: CT ABDOMEN AND PELVIS WITH CONTRAST TECHNIQUE: Multidetector CT imaging of the abdomen and pelvis was performed using the standard protocol following bolus administration of intravenous contrast. CONTRAST:  112m OMNIPAQUE IOHEXOL 300 MG/ML  SOLN COMPARISON:  None. FINDINGS: Lower chest: There is a left lower lobe airspace opacity. The heart size is enlarged.There is no significant pericardial effusion. Hepatobiliary: There is an indeterminate 3.4 cm hypoattenuating mass in hepatic segment 4A/B. The gallbladder is distended without CT evidence for acute cholecystitis.The common bile duct is dilated measuring approximately 8 mm. Pancreas: The pancreas is somewhat atrophic. The pancreatic duct is dilated. Multiple calcifications are noted in the pancreas, including in the expected region of the distal pancreatic duct. Spleen: There is a 9 mm hypoattenuating nodule in the lower pole of the spleen. There are additional scattered hypoattenuating nodules throughout the remaining portions of the spleen. The spleen is borderline enlarged measuring approximately 11 cm. Adrenals/Urinary Tract: --Adrenal glands: There is a 2.6 cm indeterminate left adrenal mass. --Right kidney/ureter: No hydronephrosis or perinephric hematoma. --Left  kidney/ureter: No hydronephrosis or perinephric hematoma. --Urinary bladder: Unremarkable. Stomach/Bowel: --Stomach/Duodenum: No hiatal hernia or other gastric abnormality. Normal duodenal course and caliber. --Small bowel: No dilatation or inflammation. --Colon: There is a large amount of stool throughout the colon. --Appendix: Not visualized. No right lower quadrant inflammation or free fluid. Vascular/Lymphatic: Atherosclerotic calcification is present within the non-aneurysmal abdominal aorta, without hemodynamically significant stenosis. --No retroperitoneal lymphadenopathy. --No mesenteric lymphadenopathy. --No pelvic or inguinal lymphadenopathy. Reproductive: Unremarkable Other: No ascites or free air. The abdominal wall is normal. Musculoskeletal. Multilevel degenerative disc disease and facet arthrosis. No bony spinal canal stenosis. IMPRESSION: 1. Left lower lobe airspace opacity concerning for a developing pneumonia. 2. Indeterminate 3.4 cm mass in hepatic segment 4. Follow-up with a nonemergent outpatient liver mass protocol MRI is recommended. 3. Distended gallbladder without evidence of acute cholecystitis. 4. Dilated common bile duct and pancreatic duct. There are multiple calcifications in the pancreas, some of which appear to be centered within the pancreatic duct. Follow-up with a nonemergent MRCP is recommended for further evaluation. 5. Indeterminate 2.6 cm left adrenal nodule. Follow-up with a nonemergent adrenal mass protocol MRI is recommended. 6. Large amount of stool throughout the colon. No evidence for small bowel obstruction. The appendix is not reliably identified, however there are no significant inflammatory changes in the right lower quadrant. Aortic Atherosclerosis (ICD10-I70.0). Electronically Signed   By: CConstance HolsterM.D.   On: 12/20/2018 21:02   Dg Chest Port 1 View  Result Date: 12/20/2018 CLINICAL DATA:  Altered mental status EXAM: PORTABLE CHEST 1 VIEW COMPARISON:   December 10, 2017 FINDINGS: The patient is status post prior median sternotomy. The cardiac silhouette remains enlarged. There are low lung volumes. There appears to be atelectasis at the left lung base with a small left-sided pleural effusion. No pneumothorax. There are advanced degenerative changes of the right glenohumeral joint. IMPRESSION: 1. Status post prior CABG.  Stable cardiac enlargement. 2. Opacification at the left lung base favored to represent a combination of both atelectasis and a small left-sided pleural effusion. Electronically Signed   By: CConstance HolsterM.D.   On: 12/20/2018 18:47     7/9: Transthoracic Echocardiogram  IMPRESSIONS    1. The left ventricle has normal systolic function with an ejection fraction of 60-65%. The cavity size was normal. There is mild concentric left ventricular hypertrophy. Left ventricular diastolic Doppler parameters are consistent with impaired  relaxation. Elevated left ventricular end-diastolic  pressure The E/e' is 21. No evidence of left ventricular regional wall motion abnormalities.  2. The right ventricle has normal systolic function. The cavity was normal. There is no increase in right ventricular wall thickness. Right ventricular systolic pressure is normal with an estimated pressure of 28.1 mmHg.  3. Left atrial size was moderately dilated.  4. Right atrial size was moderately dilated.  5. Mild thickening of the mitral valve leaflet. Mild calcification of the mitral valve leaflet. There is mild mitral annular calcification present. No evidence of mitral valve stenosis.  6. Moderate thickening of the aortic valve. Moderate calcification of the aortic valve. No stenosis of the aortic valve.  7. The aortic root is normal in size and structure.  8. There is mild dilatation of the ascending aorta measuring 41 mm.  9. The inferior vena cava was dilated in size with >50% respiratory variability.   Subjective: Uncomfortable overnight. No  other issues.  Discharge Exam: Vitals:   12/23/18 0344 12/23/18 0843  BP: 127/65 (!) 153/65  Pulse: 67 62  Resp: 15 20  Temp: 98.6 F (37 C) (!) 97.1 F (36.2 C)  SpO2: 95% 100%   Vitals:   12/22/18 1948 12/22/18 2337 12/23/18 0344 12/23/18 0843  BP: 131/64 (!) 144/97 127/65 (!) 153/65  Pulse: 72 72 67 62  Resp: 16 17 15 20   Temp: 98.2 F (36.8 C) 97.9 F (36.6 C) 98.6 F (37 C) (!) 97.1 F (36.2 C)  TempSrc: Oral Oral Oral Axillary  SpO2: 100% 97% 95% 100%  Weight:      Height:        General: Pt is alert, awake, not in acute distress Cardiovascular: RRR, S1/S2 +, no rubs, no gallops Respiratory: CTA bilaterally, no wheezing, no rhonchi Abdominal: Soft, NT, ND, bowel sounds + Extremities: no edema, no cyanosis    The results of significant diagnostics from this hospitalization (including imaging, microbiology, ancillary and laboratory) are listed below for reference.     Microbiology: Recent Results (from the past 240 hour(s))  SARS Coronavirus 2 (CEPHEID- Performed in [place] hospital lab), Hosp Order     Status: None   Collection Time: 12/20/18  5:14 PM   Specimen: Nasopharyngeal Swab  Result Value Ref Range Status   SARS Coronavirus 2 NEGATIVE NEGATIVE Final    Comment: (NOTE) If result is NEGATIVE SARS-CoV-2 target nucleic acids are NOT DETECTED. The SARS-CoV-2 RNA is generally detectable in upper and lower  respiratory specimens during the acute phase of infection. The lowest  concentration of SARS-CoV-2 viral copies this assay can detect is 250  copies / mL. A negative result does not preclude SARS-CoV-2 infection  and should not be used as the sole basis for treatment or other  patient management decisions.  A negative result may occur with  improper specimen collection / handling, submission of specimen other  than nasopharyngeal swab, presence of viral mutation(s) within the  areas targeted by this assay, and inadequate number of viral copies   (<250 copies / mL). A negative result must be combined with clinical  observations, patient history, and epidemiological information. If result is POSITIVE SARS-CoV-2 target nucleic acids are DETECTED. The SARS-CoV-2 RNA is generally detectable in upper and lower  respiratory specimens dur ing the acute phase of infection.  Positive  results are indicative of active infection with SARS-CoV-2.  Clinical  correlation with patient history and other diagnostic information is  necessary to determine patient infection status.  Positive results do  not rule  out bacterial infection or co-infection with other viruses. If result is PRESUMPTIVE POSTIVE SARS-CoV-2 nucleic acids MAY BE PRESENT.   A presumptive positive result was obtained on the submitted specimen  and confirmed on repeat testing.  While 2019 novel coronavirus  (SARS-CoV-2) nucleic acids may be present in the submitted sample  additional confirmatory testing may be necessary for epidemiological  and / or clinical management purposes  to differentiate between  SARS-CoV-2 and other Sarbecovirus currently known to infect humans.  If clinically indicated additional testing with an alternate test  methodology [phone]) is advised. The SARS-CoV-2 RNA is generally  detectable in upper and lower respiratory sp ecimens during the acute  phase of infection. The expected result is Negative. Fact Sheet for Patients:  StrictlyIdeas.no Fact Sheet for Healthcare Providers: BankingDealers.co.za This test is not yet approved or cleared by the [place] FDA and has been authorized for detection and/or diagnosis of SARS-CoV-2 by FDA under an Emergency Use Authorization (EUA).  This EUA will remain in effect (meaning this test can be used) for the duration of the COVID-19 declaration under Section 564(b)(1) of the Act, 21 U.S.C. section 360bbb-3(b)(1), unless the authorization is terminated or  revoked sooner. Performed at [facility], [place] [address] [place], [place] [address]   Urine culture     Status: None   Collection Time: 12/20/18  5:47 PM   Specimen: Urine, Random  Result Value Ref Range Status   Specimen Description URINE, RANDOM  Final   Special Requests NONE  Final   Culture   Final    NO GROWTH Performed at [facility], [place] [address], [place], [place] [address]    Report Status 12/21/2018 FINAL  Final  Blood culture (routine x 2)     Status: Abnormal   Collection Time: 12/20/18  5:50 PM   Specimen: BLOOD  Result Value Ref Range Status   Specimen Description BLOOD RIGHT ANTECUBITAL  Final   Special Requests   Final    BOTTLES DRAWN AEROBIC AND ANAEROBIC Blood Culture results may not be optimal due to an inadequate volume of blood received in culture bottles   Culture  Setup Time   Final    GRAM POSITIVE COCCI IN BOTH AEROBIC AND ANAEROBIC BOTTLES CRITICAL RESULT CALLED TO, READ BACK BY AND VERIFIED WITH: PHARMD L  [id number] AT 573 AM BY CM Performed at [facility], [place] [address] [place], [place] [address]    Culture GROUP B STREP(S.AGALACTIAE)ISOLATED (A)  Final   Report Status 12/23/2018 FINAL  Final   Organism ID, Bacteria GROUP B STREP(S.AGALACTIAE)ISOLATED  Final      Susceptibility   Group b strep(s.agalactiae)isolated - MIC*    CLINDAMYCIN >=1 RESISTANT Resistant     AMPICILLIN <=0.25 SENSITIVE Sensitive     ERYTHROMYCIN >=8 RESISTANT Resistant     VANCOMYCIN 0.5 SENSITIVE Sensitive     CEFTRIAXONE <=0.12 SENSITIVE Sensitive     LEVOFLOXACIN 0.5 SENSITIVE Sensitive     * GROUP B STREP(S.AGALACTIAE)ISOLATED  Blood Culture ID Panel (Reflexed)     Status: Abnormal   Collection Time: 12/20/18  5:50 PM  Result Value Ref Range Status   Enterococcus species NOT DETECTED NOT DETECTED Final   Listeria monocytogenes NOT DETECTED NOT DETECTED Final   Staphylococcus species NOT DETECTED NOT DETECTED Final   Staphylococcus aureus  (BCID) NOT DETECTED NOT DETECTED Final   Streptococcus species DETECTED (A) NOT DETECTED Final    Comment: CRITICAL RESULT CALLED TO, READ BACK  BY AND VERIFIED WITH: PHARMD L SEAY [id number] AT 9 AM BY CM    Streptococcus agalactiae DETECTED (A) NOT DETECTED Final    Comment: CRITICAL RESULT CALLED TO, READ BACK BY AND VERIFIED WITH: PHARMD L SEAY [id number] AT 109 AM BY CM    Streptococcus pneumoniae NOT DETECTED NOT DETECTED Final   Streptococcus pyogenes NOT DETECTED NOT DETECTED Final   Acinetobacter baumannii NOT DETECTED NOT DETECTED Final   Enterobacteriaceae species NOT DETECTED NOT DETECTED Final   Enterobacter cloacae complex NOT DETECTED NOT DETECTED Final   Escherichia coli NOT DETECTED NOT DETECTED Final   Klebsiella oxytoca NOT DETECTED NOT DETECTED Final   Klebsiella pneumoniae NOT DETECTED NOT DETECTED Final   Proteus species NOT DETECTED NOT DETECTED Final   Serratia marcescens NOT DETECTED NOT DETECTED Final   Haemophilus influenzae NOT DETECTED NOT DETECTED Final   Neisseria meningitidis NOT DETECTED NOT DETECTED Final   Pseudomonas aeruginosa NOT DETECTED NOT DETECTED Final   Candida albicans NOT DETECTED NOT DETECTED Final   Candida glabrata NOT DETECTED NOT DETECTED Final   Candida krusei NOT DETECTED NOT DETECTED Final   Candida parapsilosis NOT DETECTED NOT DETECTED Final   Candida tropicalis NOT DETECTED NOT DETECTED Final    Comment: Performed at [facility], [place]. [address], [place], [place] [address]  Blood culture (routine x 2)     Status: Abnormal   Collection Time: 12/20/18  5:57 PM   Specimen: BLOOD RIGHT WRIST  Result Value Ref Range Status   Specimen Description BLOOD RIGHT WRIST  Final   Special Requests   Final    BOTTLES DRAWN AEROBIC AND ANAEROBIC Blood Culture results may not be optimal due to an inadequate volume of blood received in culture bottles   Culture  Setup Time   Final    GRAM POSITIVE COCCI IN BOTH AEROBIC AND ANAEROBIC BOTTLES  CRITICAL RESULT CALLED TO, READ BACK BY AND VERIFIED WITH: PHARMD L SEAY [id number] AT 20 AM BY CM    Culture (A)  Final    GROUP B STREP(S.AGALACTIAE)ISOLATED SUSCEPTIBILITIES PERFORMED ON PREVIOUS CULTURE WITHIN THE LAST 5 DAYS. Performed at [facility], [place] [address], [place],  [address]    Report Status 12/23/2018 FINAL  Final     Labs: BNP (last 3 results) No results for input(s): BNP in the last 8760 hours. Basic Metabolic Panel: Recent Labs  Lab 12/20/18 1733 12/20/18 1802 12/21/18 0427 12/22/18 0319  NA 130* 131* 131* 135  K 4.0 4.2 3.8 4.1  CL 94*  --  99 105  CO2 21*  --  22 24  GLUCOSE 264*  --  201* 197*  BUN 16  --  18 11  CREATININE 0.95  --  1.00 0.69  CALCIUM 9.2  --  8.4* 8.5*   Liver Function Tests: Recent Labs  Lab 12/20/18 1733  AST 19  ALT 16  ALKPHOS 91  BILITOT 0.7  PROT 7.1  ALBUMIN 3.9   No results for input(s): LIPASE, AMYLASE in the last 168 hours. Recent Labs  Lab 12/20/18 1730  AMMONIA 16   CBC: Recent Labs  Lab 12/20/18 1733 12/20/18 1802 12/21/18 0427 12/22/18 0319  WBC 17.8*  --  26.5* 12.8*  NEUTROABS 16.3*  --   --   --   HGB 13.2 14.3 11.4* 10.6*  HCT 39.6 42.0 33.4* 32.4*  MCV 89.0  --  88.6 90.3  PLT 182  --  238 218   Cardiac Enzymes: No  results for input(s): CKTOTAL, CKMB, CKMBINDEX, TROPONINI in the last 168 hours. BNP: Invalid input(s): POCBNP CBG: Recent Labs  Lab 12/22/18 0627 12/22/18 1135 12/22/18 1511 12/22/18 2059 12/23/18 0638  GLUCAP 194* 175* 220* 252* 194*   D-Dimer No results for input(s): DDIMER in the last 72 hours. Hgb A1c Recent Labs    12/21/18 0427  HGBA1C 10.9*   Lipid Profile No results for input(s): CHOL, HDL, LDLCALC, TRIG, CHOLHDL, LDLDIRECT in the last 72 hours. Thyroid function studies Recent Labs    12/20/18 1757  TSH 0.534   Anemia work up No results for input(s): VITAMINB12, FOLATE, FERRITIN, TIBC, IRON, RETICCTPCT in the last 72 hours.  Urinalysis    Component Value Date/Time   COLORURINE YELLOW 12/20/2018 1712   APPEARANCEUR CLEAR 12/20/2018 1712   LABSPEC 1.011 12/20/2018 1712   PHURINE 6.0 12/20/2018 1712   GLUCOSEU >=500 (A) 12/20/2018 1712   HGBUR NEGATIVE 12/20/2018 1712   BILIRUBINUR NEGATIVE 12/20/2018 1712   BILIRUBINUR negative 09/01/2018 1432   KETONESUR NEGATIVE 12/20/2018 1712   PROTEINUR NEGATIVE 12/20/2018 1712   UROBILINOGEN 1.0 09/01/2018 1432   UROBILINOGEN 0.2 01/06/2014 1530   NITRITE NEGATIVE 12/20/2018 1712   LEUKOCYTESUR NEGATIVE 12/20/2018 1712   Sepsis Labs Invalid input(s): PROCALCITONIN,  WBC,  LACTICIDVEN Microbiology Recent Results (from the past 240 hour(s))  SARS Coronavirus 2 (CEPHEID- Performed in [place] hospital lab), Hosp Order     Status: None   Collection Time: 12/20/18  5:14 PM   Specimen: Nasopharyngeal Swab  Result Value Ref Range Status   SARS Coronavirus 2 NEGATIVE NEGATIVE Final    Comment: (NOTE) If result is NEGATIVE SARS-CoV-2 target nucleic acids are NOT DETECTED. The SARS-CoV-2 RNA is generally detectable in upper and lower  respiratory specimens during the acute phase of infection. The lowest  concentration of SARS-CoV-2 viral copies this assay can detect is 250  copies / mL. A negative result does not preclude SARS-CoV-2 infection  and should not be used as the sole basis for treatment or other  patient management decisions.  A negative result may occur with  improper specimen collection / handling, submission of specimen other  than nasopharyngeal swab, presence of viral mutation(s) within the  areas targeted by this assay, and inadequate number of viral copies  (<250 copies / mL). A negative result must be combined with clinical  observations, patient history, and epidemiological information. If result is POSITIVE SARS-CoV-2 target nucleic acids are DETECTED. The SARS-CoV-2 RNA is generally detectable in upper and lower  respiratory specimens dur  ing the acute phase of infection.  Positive  results are indicative of active infection with SARS-CoV-2.  Clinical  correlation with patient history and other diagnostic information is  necessary to determine patient infection status.  Positive results do  not rule out bacterial infection or co-infection with other viruses. If result is PRESUMPTIVE POSTIVE SARS-CoV-2 nucleic acids MAY BE PRESENT.   A presumptive positive result was obtained on the submitted specimen  and confirmed on repeat testing.  While 2019 novel coronavirus  (SARS-CoV-2) nucleic acids may be present in the submitted sample  additional confirmatory testing may be necessary for epidemiological  and / or clinical management purposes  to differentiate between  SARS-CoV-2 and other Sarbecovirus currently known to infect humans.  If clinically indicated additional testing with an alternate test  methodology [phone]) is advised. The SARS-CoV-2 RNA is generally  detectable in upper and lower respiratory sp ecimens during the acute  phase of infection. The expected  result is Negative. Fact Sheet for Patients:  StrictlyIdeas.no Fact Sheet for Healthcare Providers: BankingDealers.co.za This test is not yet approved or cleared by the [place] FDA and has been authorized for detection and/or diagnosis of SARS-CoV-2 by FDA under an Emergency Use Authorization (EUA).  This EUA will remain in effect (meaning this test can be used) for the duration of the COVID-19 declaration under Section 564(b)(1) of the Act, 21 U.S.C. section 360bbb-3(b)(1), unless the authorization is terminated or revoked sooner. Performed at [facility], [place] [address] [place], [place] [address]   Urine culture     Status: None   Collection Time: 12/20/18  5:47 PM   Specimen: Urine, Random  Result Value Ref Range Status   Specimen Description URINE, RANDOM  Final   Special Requests NONE   Final   Culture   Final    NO GROWTH Performed at [facility], [place] [address], [place], [place] [address]    Report Status 12/21/2018 FINAL  Final  Blood culture (routine x 2)     Status: Abnormal   Collection Time: 12/20/18  5:50 PM   Specimen: BLOOD  Result Value Ref Range Status   Specimen Description BLOOD RIGHT ANTECUBITAL  Final   Special Requests   Final    BOTTLES DRAWN AEROBIC AND ANAEROBIC Blood Culture results may not be optimal due to an inadequate volume of blood received in culture bottles   Culture  Setup Time   Final    GRAM POSITIVE COCCI IN BOTH AEROBIC AND ANAEROBIC BOTTLES CRITICAL RESULT CALLED TO, READ BACK BY AND VERIFIED WITH: PHARMD L  [id number] AT 250 AM BY CM Performed at [facility], [place] [address], [place], [place] [address]    Culture GROUP B STREP(S.AGALACTIAE)ISOLATED (A)  Final   Report Status 12/23/2018 FINAL  Final   Organism ID, Bacteria GROUP B STREP(S.AGALACTIAE)ISOLATED  Final      Susceptibility   Group b strep(s.agalactiae)isolated - MIC*    CLINDAMYCIN >=1 RESISTANT Resistant     AMPICILLIN <=0.25 SENSITIVE Sensitive     ERYTHROMYCIN >=8 RESISTANT Resistant     VANCOMYCIN 0.5 SENSITIVE Sensitive     CEFTRIAXONE <=0.12 SENSITIVE Sensitive     LEVOFLOXACIN 0.5 SENSITIVE Sensitive     * GROUP B STREP(S.AGALACTIAE)ISOLATED  Blood Culture ID Panel (Reflexed)     Status: Abnormal   Collection Time: 12/20/18  5:50 PM  Result Value Ref Range Status   Enterococcus species NOT DETECTED NOT DETECTED Final   Listeria monocytogenes NOT DETECTED NOT DETECTED Final   Staphylococcus species NOT DETECTED NOT DETECTED Final   Staphylococcus aureus (BCID) NOT DETECTED NOT DETECTED Final   Streptococcus species DETECTED (A) NOT DETECTED Final    Comment: CRITICAL RESULT CALLED TO, READ BACK BY AND VERIFIED WITH: PHARMD L SEAY [id number] AT 742 AM BY CM    Streptococcus agalactiae DETECTED (A) NOT DETECTED Final    Comment: CRITICAL RESULT  CALLED TO, READ BACK BY AND VERIFIED WITH: PHARMD L SEAY [id number] AT 742 AM BY CM    Streptococcus pneumoniae NOT DETECTED NOT DETECTED Final   Streptococcus pyogenes NOT DETECTED NOT DETECTED Final   Acinetobacter baumannii NOT DETECTED NOT DETECTED Final   Enterobacteriaceae species NOT DETECTED NOT DETECTED Final   Enterobacter cloacae complex NOT DETECTED NOT DETECTED Final   Escherichia coli NOT DETECTED NOT DETECTED Final   Klebsiella oxytoca NOT DETECTED NOT DETECTED Final   Klebsiella pneumoniae NOT DETECTED NOT DETECTED Final  Proteus species NOT DETECTED NOT DETECTED Final   Serratia marcescens NOT DETECTED NOT DETECTED Final   Haemophilus influenzae NOT DETECTED NOT DETECTED Final   Neisseria meningitidis NOT DETECTED NOT DETECTED Final   Pseudomonas aeruginosa NOT DETECTED NOT DETECTED Final   Candida albicans NOT DETECTED NOT DETECTED Final   Candida glabrata NOT DETECTED NOT DETECTED Final   Candida krusei NOT DETECTED NOT DETECTED Final   Candida parapsilosis NOT DETECTED NOT DETECTED Final   Candida tropicalis NOT DETECTED NOT DETECTED Final    Comment: Performed at [facility], [place] [address], [place], [place] [address]  Blood culture (routine x 2)     Status: Abnormal   Collection Time: 12/20/18  5:57 PM   Specimen: BLOOD RIGHT WRIST  Result Value Ref Range Status   Specimen Description BLOOD RIGHT WRIST  Final   Special Requests   Final    BOTTLES DRAWN AEROBIC AND ANAEROBIC Blood Culture results may not be optimal due to an inadequate volume of blood received in culture bottles   Culture  Setup Time   Final    GRAM POSITIVE COCCI IN BOTH AEROBIC AND ANAEROBIC BOTTLES CRITICAL RESULT CALLED TO, READ BACK BY AND VERIFIED WITH: PHARMD L SEAY [id number] AT 49 AM BY CM    Culture (A)  Final    GROUP B STREP(S.AGALACTIAE)ISOLATED SUSCEPTIBILITIES PERFORMED ON PREVIOUS CULTURE WITHIN THE LAST 5 DAYS. Performed at [facility], [place] [address],  [place], [place] [address]    Report Status 12/23/2018 FINAL  Final    SIGNED:   Cordelia Poche, MD [facility] 12/23/2018, 8:59 AM

## 2018-12-23 NOTE — Progress Notes (Signed)
Pt given discharge summary and discharged home via daughter as transportation.

## 2018-12-23 NOTE — Discharge Instructions (Signed)
Meredith Gutierrez,  You were in the hospital because of bacteria in your blood. You have been started on antibiotics. Please complete your prescription. Incidentally, you were found to have a mass on your liver and adrenal gland. Your PCP should follow-up on this with an MRI when able. Your hemoglobin A1C was high. This likely means your diabetes is not as well controlled as it should be. You have not been started on any new medications here, but the recommendations is for you to follow-up withy our PCP to likely have your medications adjusted for better control.

## 2018-12-26 ENCOUNTER — Telehealth: Payer: Self-pay

## 2018-12-26 NOTE — Telephone Encounter (Signed)
LM with patients daughter requesting call back to complete TCM and confirm hospital follow up.

## 2018-12-26 NOTE — Telephone Encounter (Signed)
Transition Care Management Follow-up Telephone Call   Date discharged? 12/23/18   How have you been since you were released from the hospital? Patient's daughter is primary caregiver and completed this assessment with me via phone.  She states her mother is doing great and has rebounded very well with having pneumonia.    Do you understand why you were in the hospital? Yes   Do you understand the discharge instructions? Yes   Where were you discharged to? Home with around the clock care per her prior routine.    Items Reviewed:  Medications reviewed: Yes  Allergies reviewed: Yes  Dietary changes reviewed: No changes  Referrals reviewed: Referral to f/u with PCP, appointment made.    Functional Questionnaire:   Activities of Daily Living (ADLs):  Patient lives with daughter and receives 24hour around the clock nursing care to meet all ADL needs. Daughter states she requires some level of assistance with all ADLs.   Any transportation issues/concerns?: NO   Any patient concerns? None   Confirmed importance and date/time of follow-up visits scheduled Yes  Provider Appointment booked with Dr. Danise Mina for Monday 01/01/19  Confirmed with patient if condition begins to worsen call PCP or go to the ER.  Patient was given the office number and encouraged to call back with question or concerns.  : Yes

## 2019-01-01 ENCOUNTER — Encounter: Payer: Self-pay | Admitting: Family Medicine

## 2019-01-01 ENCOUNTER — Ambulatory Visit (INDEPENDENT_AMBULATORY_CARE_PROVIDER_SITE_OTHER): Payer: Medicare HMO | Admitting: Family Medicine

## 2019-01-01 ENCOUNTER — Other Ambulatory Visit: Payer: Self-pay

## 2019-01-01 VITALS — BP 122/78 | HR 95 | Temp 98.7°F | Ht 60.0 in | Wt 138.1 lb

## 2019-01-01 DIAGNOSIS — J189 Pneumonia, unspecified organism: Secondary | ICD-10-CM

## 2019-01-01 DIAGNOSIS — F0390 Unspecified dementia without behavioral disturbance: Secondary | ICD-10-CM | POA: Diagnosis not present

## 2019-01-01 DIAGNOSIS — R7881 Bacteremia: Secondary | ICD-10-CM

## 2019-01-01 DIAGNOSIS — K838 Other specified diseases of biliary tract: Secondary | ICD-10-CM | POA: Diagnosis not present

## 2019-01-01 DIAGNOSIS — M545 Low back pain, unspecified: Secondary | ICD-10-CM

## 2019-01-01 DIAGNOSIS — G8929 Other chronic pain: Secondary | ICD-10-CM

## 2019-01-01 DIAGNOSIS — R160 Hepatomegaly, not elsewhere classified: Secondary | ICD-10-CM

## 2019-01-01 DIAGNOSIS — E278 Other specified disorders of adrenal gland: Secondary | ICD-10-CM

## 2019-01-01 DIAGNOSIS — E1165 Type 2 diabetes mellitus with hyperglycemia: Secondary | ICD-10-CM | POA: Diagnosis not present

## 2019-01-01 DIAGNOSIS — B951 Streptococcus, group B, as the cause of diseases classified elsewhere: Secondary | ICD-10-CM

## 2019-01-01 DIAGNOSIS — J181 Lobar pneumonia, unspecified organism: Secondary | ICD-10-CM | POA: Diagnosis not present

## 2019-01-01 DIAGNOSIS — IMO0001 Reserved for inherently not codable concepts without codable children: Secondary | ICD-10-CM

## 2019-01-01 DIAGNOSIS — K861 Other chronic pancreatitis: Secondary | ICD-10-CM | POA: Insufficient documentation

## 2019-01-01 LAB — CBC WITH DIFFERENTIAL/PLATELET
Basophils Absolute: 0.1 10*3/uL (ref 0.0–0.1)
Basophils Relative: 0.7 % (ref 0.0–3.0)
Eosinophils Absolute: 0.1 10*3/uL (ref 0.0–0.7)
Eosinophils Relative: 0.8 % (ref 0.0–5.0)
HCT: 38.5 % (ref 36.0–46.0)
Hemoglobin: 12.8 g/dL (ref 12.0–15.0)
Lymphocytes Relative: 21.6 % (ref 12.0–46.0)
Lymphs Abs: 2.4 10*3/uL (ref 0.7–4.0)
MCHC: 33.4 g/dL (ref 30.0–36.0)
MCV: 88.7 fl (ref 78.0–100.0)
Monocytes Absolute: 0.9 10*3/uL (ref 0.1–1.0)
Monocytes Relative: 8.2 % (ref 3.0–12.0)
Neutro Abs: 7.6 10*3/uL (ref 1.4–7.7)
Neutrophils Relative %: 68.7 % (ref 43.0–77.0)
Platelets: 405 10*3/uL — ABNORMAL HIGH (ref 150.0–400.0)
RBC: 4.34 Mil/uL (ref 3.87–5.11)
RDW: 14.6 % (ref 11.5–15.5)
WBC: 11 10*3/uL — ABNORMAL HIGH (ref 4.0–10.5)

## 2019-01-01 LAB — BASIC METABOLIC PANEL
BUN: 15 mg/dL (ref 6–23)
CO2: 30 mEq/L (ref 19–32)
Calcium: 9.7 mg/dL (ref 8.4–10.5)
Chloride: 95 mEq/L — ABNORMAL LOW (ref 96–112)
Creatinine, Ser: 0.74 mg/dL (ref 0.40–1.20)
GFR: 75.51 mL/min (ref >60.00)
Glucose, Bld: 222 mg/dL — ABNORMAL HIGH (ref 70–99)
Potassium: 5.5 mEq/L — ABNORMAL HIGH (ref 3.5–5.1)
Sodium: 132 mEq/L — ABNORMAL LOW (ref 135–145)

## 2019-01-01 MED ORDER — METFORMIN HCL ER 500 MG PO TB24: 1000.0000 mg | ORAL_TABLET | Freq: Every day | ORAL | 1 refills | Status: DC

## 2019-01-01 NOTE — Progress Notes (Signed)
This visit was conducted in person.  BP 122/78 (BP Location: Left Arm, Patient Position: Sitting, Cuff Size: Normal)   Pulse 95   Temp 98.7 F (37.1 C) (Temporal)   Ht 5' (1.524 m)   Wt 138 lb 1.6 oz (62.6 kg)   SpO2 96%   BMI 26.97 kg/m    CC: hosp f/u visit Subjective:    Patient ID: Meredith Gutierrez, female    DOB: 1941/01/26, 78 y.o.   MRN: [id number]  HPI: Meredith Gutierrez is a 78 y.o. female presenting on 01/01/2019 for Hospitalization Follow-up (HFU for recent PNA - Pt states that she feels "great" since being d/c'd home. )   Daughter requests FMLA forms filled out.   Recent hospitalization for fever with altered mental status found to have GBS bacteremia presumed from LLL PNA. Seen by ID in hospital, treated with aztreonam/flagyl then levaquin transitioned to IV vanc then linezolid PO to complete 5 days. CT head with chronic ischemic/atrophic changes. Interestingly she never had cough or dyspnea. Covid19 nasal swab negative. Rapid deterioration prior to hospitalization.   For narcotic induced constipation (on MS contin and MSIR), started senokot-S and miralax daily.   Was also found to have liver/adrenal masses (3.4cm mass in liver, dilated CBD and distended gallbladder, atrophic pancreas with dilated pancreatic duct, 37m nodule lower spleen along with other smaller nodules, 2.6cm L adrenal mass) - recommendation was for outpatient liver and adrenal MRI as well as MRCP.   Sugars have been markedly elevated. Struggling with diabetic diet. Dementia contributes to poor food choices.  Lab Results  Component Value Date   HGBA1C 10.9 (H) 12/21/2018     Since home feeling better.   Admit date: 12/20/2018 Discharge date: 12/23/2018 TCM phone call completed 12/26/2018  Admitted From: Home Disposition: Home  Discharge Diagnoses:  Principal Problem:   Bacteremia due to group B Streptococcus Active Problems:   Essential hypertension   Hx of CABG   Chronic diastolic  (congestive) heart failure (HCC)   Dementia (HCC)   COPD (chronic obstructive pulmonary disease) (HCC)   Recurrent boils   Coronary artery disease involving coronary bypass graft of native heart without angina pectoris   Diabetes mellitus type 2 in nonobese (HCC)   Liver mass   Adrenal mass (HCC)   Acute encephalopathy   H/O total hip arthroplasty, left  Recommendations for Outpatient Follow-up:  1. Follow up with PCP in 1 week 2. Please obtain BMP/CBC in one week 3. Please follow up on the following pending results: None  Home Health: None Equipment/Devices: None Discharge Condition: Stable CODE STATUS: Full code Diet recommendation: Heart healthy      Relevant past medical, surgical, family and social history reviewed and updated as indicated. Interim medical history since our last visit reviewed. Allergies and medications reviewed and updated. Outpatient Medications Prior to Visit  Medication Sig Dispense Refill  . ACCU-CHEK FASTCLIX LANCETS MISC 1 each by Does not apply route daily. Use as directed to check blood sugar once daily.  Dx code:  E11.65 100 each 0  . albuterol (PROVENTIL) (2.5 MG/3ML) 0.083% nebulizer solution Take 3 mLs (2.5 mg total) by nebulization every 6 (six) hours as needed for wheezing or shortness of breath. 75 mL 1  . Alcohol Swabs (B-D SINGLE USE SWABS REGULAR) PADS 1 each by Does not apply route daily. Use as directed to check blood sugar once daily.  Dx code:  E11.65 100 each 0  . aspirin EC 81 MG tablet Take 81  mg by mouth daily.    . Blood Glucose Monitoring Suppl (ACCU-CHEK AVIVA PLUS) w/Device KIT 1 each by Does not apply route daily. Use as directed to check blood sugar once daily.  Dx code:  E11.65 1 kit 0  . candesartan (ATACAND) 16 MG tablet TAKE 1 TABLET BY MOUTH DAILY (Patient taking differently: Take 16 mg by mouth daily. ) 30 tablet 6  . carboxymethylcellulose (REFRESH PLUS) 0.5 % SOLN Place 1 drop into both eyes 2 (two) times daily as needed  (for irritation).     . Cholecalciferol (VITAMIN D) 2000 units CAPS Take 1 capsule (2,000 Units total) by mouth daily. (Patient taking differently: Take 2,000 Units by mouth daily. ) 30 capsule   . donepezil (ARICEPT) 5 MG tablet TAKE ONE TABLET BY MOUTH AT BEDTIME 30 tablet 3  . furosemide (LASIX) 80 MG tablet Take 1 tablet by mouth daily (Patient taking differently: Take 80 mg by mouth daily. ) 90 tablet 1  . glucose blood (ACCU-CHEK AVIVA PLUS) test strip 1 each by Other route as needed for other. Use as instructed to check blood sugar once daily.  Dx code:  E11.65 100 each 0  . LORazepam (ATIVAN) 0.5 MG tablet TAKE 1 TABLET BY MOUTH AT BEDTIME AS NEEDED FOR ANXIETY OR SLEEP MAY TAKE SECOND 1/2 TABLET AS NEEDED (Patient taking differently: Take 0.5-1 mg by mouth See admin instructions. Take 0.5-1 mg by mouth at 7 PM every evening) 45 tablet 0  . morphine (MS CONTIN) 15 MG 12 hr tablet Take 15 mg by mouth See admin instructions. Take 15 mg by mouth two times a day- 10 AM and 6 PM    . morphine (MSIR) 15 MG tablet Take 15 mg by mouth See admin instructions. Take 15 mg by mouth four times a day- 6:30 AM, 7:30 AM, 1:00 PM, 3:00 PM    . polyethylene glycol (MIRALAX / GLYCOLAX) 17 g packet Take 17 g by mouth daily as needed. 14 each 0  . potassium chloride SA (K-DUR,KLOR-CON) 20 MEQ tablet Take 2 tablets (40 mEq total) by mouth daily. (Patient taking differently: Take 20 mEq by mouth daily. ) 180 tablet 1  . senna-docusate (SENOKOT-S) 8.6-50 MG tablet Take 1 tablet by mouth at bedtime.    . sodium chloride (OCEAN) 0.65 % SOLN nasal spray Place 1 spray into both nostrils as needed for congestion.    . metFORMIN (GLUCOPHAGE-XR) 500 MG 24 hr tablet TAKE 1 TABLET BY MOUTH DAILY AFTER SUPPER (Patient taking differently: Take 500 mg by mouth daily after supper. ) 30 tablet 6   No facility-administered medications prior to visit.      Per HPI unless specifically indicated in ROS section below Review of  Systems Objective:    BP 122/78 (BP Location: Left Arm, Patient Position: Sitting, Cuff Size: Normal)   Pulse 95   Temp 98.7 F (37.1 C) (Temporal)   Ht 5' (1.524 m)   Wt 138 lb 1.6 oz (62.6 kg)   SpO2 96%   BMI 26.97 kg/m   Wt Readings from Last 3 Encounters:  01/01/19 138 lb 1.6 oz (62.6 kg)  12/20/18 140 lb (63.5 kg)  08/18/18 145 lb 6.4 oz (66 kg)    Physical Exam Vitals signs and nursing note reviewed.  Constitutional:      General: She is not in acute distress.    Appearance: Normal appearance. She is not Gutierrez-appearing.  HENT:     Mouth/Throat:     Mouth: Mucous membranes  are moist.     Pharynx: No posterior oropharyngeal erythema.  Eyes:     Extraocular Movements: Extraocular movements intact.     Pupils: Pupils are equal, round, and reactive to light.  Cardiovascular:     Rate and Rhythm: Normal rate and regular rhythm.     Pulses: Normal pulses.     Heart sounds: Normal heart sounds. No murmur.  Pulmonary:     Effort: Pulmonary effort is normal. No respiratory distress.     Breath sounds: Normal breath sounds. No wheezing, rhonchi or rales.  Abdominal:     General: Abdomen is flat. Bowel sounds are increased. There is no distension.     Palpations: Abdomen is soft. There is no mass.     Tenderness: There is no abdominal tenderness. There is no right CVA tenderness, left CVA tenderness, guarding or rebound. Negative signs include Murphy's sign.     Hernia: No hernia is present. There is no hernia in the umbilical area.  Skin:    General: Skin is warm and dry.     Findings: No rash.  Neurological:     Mental Status: She is alert.  Psychiatric:        Mood and Affect: Mood normal.       Assessment & Plan:   Problem List Items Addressed This Visit    Uncontrolled diabetes mellitus type 2 without complications ([place])    Deteriorated control based on A1c of >10% at hospital - daughter endorses increasing difficulty with diet control at home, dementia  complicates situation (forgets when she's eaten). Will slowly increase metformin XR to 1080m daily, reassess at f/u visit.       Relevant Medications   metFORMIN (GLUCOPHAGE-XR) 500 MG 24 hr tablet   Other Relevant Orders   Basic metabolic panel (Completed)   Liver mass    Newly found - with other abnormalities on CT will refer to GI for further revaluation recommendations. Spoke with patient and daughter in setting of dementia - they do desire further evaluation at this time.       Relevant Orders   Ambulatory referral to Gastroenterology   Dilated cbd, acquired    Newly found - with other abnormalities on CT will refer to GI for further revaluation recommendations. Spoke with patient and daughter in setting of dementia - they do desire further evaluation at this time.       Relevant Orders   Ambulatory referral to Gastroenterology   Dementia ([facility] (Chronic)   Community acquired pneumonia of left lower lobe of lung ([place] [facility]    Recovering well after completing oral Linezolid course. I did ask her to return in 1 month for f/u xray to ensure fully resolved      Relevant Orders   CBC with Differential/Platelet (Completed)   DG Chest 2 View   Chronic lower back pain    Followed by pain management ([place])      Bacteremia due to group B Streptococcus    recovered      Adrenal mass ([place]    L adrenal mass. Newly found - with other abnormalities on CT will refer to GI for further revaluation recommendations. Spoke with patient and daughter in setting of dementia - they do desire further evaluation at this time.       Relevant Orders   Ambulatory referral to Gastroenterology       Meds ordered this encounter  Medications  . metFORMIN (GLUCOPHAGE-XR) 500 MG 24 hr tablet  Sig: Take 2 tablets (1,000 mg total) by mouth daily with supper.    Dispense:  180 tablet    Refill:  1   Orders Placed This Encounter  Procedures  . DG Chest 2 View    Standing Status:    Future    Standing Expiration Date:   03/03/2020    Order Specific Question:   Reason for Exam (SYMPTOM  OR DIAGNOSIS REQUIRED)    Answer:   f/u PNA    Order Specific Question:   Preferred imaging location?    Answer:   [facility]    Order Specific Question:   Radiology Contrast Protocol - do NOT remove file path    Answer:   [email or web address]  . Basic metabolic panel  . CBC with Differential/Platelet  . Ambulatory referral to Gastroenterology    Referral Priority:   Routine    Referral Type:   Consultation    Referral Reason:   Specialty Services Required    Number of Visits Requested:   1    Patient Instructions  We will refer you to GI as well as touch base with them in case they want any imaging prior to seeing you. I'm glad you're feeling better! Increase metformin to 1059m daily at night.  Labs today.  Return in 1 month for xray visit.  Return in 2 months for follow up visit.    Follow up plan: Return in about 2 months (around 03/04/2019) for follow up visit.  JRia Bush MD

## 2019-01-01 NOTE — Assessment & Plan Note (Signed)
Deteriorated control based on A1c of >10% at hospital - daughter endorses increasing difficulty with diet control at home, dementia complicates situation (forgets when she's eaten). Will slowly increase metformin XR to 1000mg  daily, reassess at f/u visit.

## 2019-01-01 NOTE — Assessment & Plan Note (Signed)
Newly found - with other abnormalities on CT will refer to GI for further revaluation recommendations. Spoke with patient and daughter in setting of dementia - they do desire further evaluation at this time.

## 2019-01-01 NOTE — Assessment & Plan Note (Addendum)
Recovering well after completing oral Linezolid course. I did ask her to return in 1 month for f/u xray to ensure fully resolved

## 2019-01-01 NOTE — Assessment & Plan Note (Addendum)
Followed by pain management Maryjean Ka)

## 2019-01-01 NOTE — Assessment & Plan Note (Signed)
recovered

## 2019-01-01 NOTE — Patient Instructions (Addendum)
We will refer you to GI as well as touch base with them in case they want any imaging prior to seeing you. I'm glad you're feeling better! Increase metformin to 1000mg  daily at night.  Labs today.  Return in 1 month for xray visit.  Return in 2 months for follow up visit.

## 2019-01-01 NOTE — Assessment & Plan Note (Addendum)
L adrenal mass. Newly found - with other abnormalities on CT will refer to GI for further revaluation recommendations. Spoke with patient and daughter in setting of dementia - they do desire further evaluation at this time.

## 2019-01-02 ENCOUNTER — Telehealth: Payer: Self-pay | Admitting: Gastroenterology

## 2019-01-02 ENCOUNTER — Other Ambulatory Visit: Payer: Self-pay | Admitting: Family Medicine

## 2019-01-02 DIAGNOSIS — I10 Essential (primary) hypertension: Secondary | ICD-10-CM

## 2019-01-02 NOTE — Telephone Encounter (Signed)
A user error has taken place.

## 2019-01-05 ENCOUNTER — Telehealth: Payer: Self-pay

## 2019-01-05 NOTE — Telephone Encounter (Signed)
Pt returned your call and is requesting a callback.

## 2019-01-05 NOTE — Telephone Encounter (Signed)
Left message for [name], patient;s daughter, to call back about results

## 2019-01-09 ENCOUNTER — Telehealth: Payer: Self-pay | Admitting: Family Medicine

## 2019-01-09 DIAGNOSIS — K769 Liver disease, unspecified: Secondary | ICD-10-CM

## 2019-01-09 DIAGNOSIS — E278 Other specified disorders of adrenal gland: Secondary | ICD-10-CM

## 2019-01-09 DIAGNOSIS — Z85820 Personal history of malignant melanoma of skin: Secondary | ICD-10-CM

## 2019-01-09 DIAGNOSIS — R160 Hepatomegaly, not elsewhere classified: Secondary | ICD-10-CM

## 2019-01-09 DIAGNOSIS — K838 Other specified diseases of biliary tract: Secondary | ICD-10-CM

## 2019-01-09 MED ORDER — DIAZEPAM 2 MG PO TABS: 2.0000 mg | ORAL_TABLET | Freq: Once | ORAL | 0 refills | Status: AC

## 2019-01-09 NOTE — Telephone Encounter (Signed)
Noted  

## 2019-01-09 NOTE — Telephone Encounter (Signed)
Spoke with [name] patients daughter regarding setting up the MRI/MRCP .

## 2019-01-09 NOTE — Telephone Encounter (Signed)
MRI/MRCP scheduled for [facility] on 01/13/2019 at 10:30am. Patient is Claustrophobic and will need some anti anxiety medicine. Please call in medicine to [facility], patients daughter requested this.

## 2019-01-09 NOTE — Telephone Encounter (Signed)
Valium 2mg  sent to pharmacy #2.  Caution with mixing with her lorazepam at home.

## 2019-01-09 NOTE — Telephone Encounter (Signed)
Spoke with radiology - recommend MRI/MRCP w/ w/o contrast for further evaluation of abnormal findings on CT.  plz notify daughter. Ordered.

## 2019-01-10 ENCOUNTER — Other Ambulatory Visit: Payer: Self-pay | Admitting: Family Medicine

## 2019-01-10 NOTE — Telephone Encounter (Signed)
Left message on vm per dpr relaying Dr. G's message.  

## 2019-01-10 NOTE — Telephone Encounter (Signed)
Name of Medication: Lorazepam Name of Pharmacy: [facility] or Written Date and Quantity: 01/01/19, #45 Last Office Visit and Type: 01/01/19, hosp f/u Next Office Visit and Type: 03/05/19, 2 mo f/u Last Controlled Substance Agreement Date: none Last UDS: none

## 2019-01-11 NOTE — Telephone Encounter (Signed)
Eprescribed.

## 2019-01-12 ENCOUNTER — Other Ambulatory Visit (INDEPENDENT_AMBULATORY_CARE_PROVIDER_SITE_OTHER): Payer: Medicare HMO

## 2019-01-12 DIAGNOSIS — I10 Essential (primary) hypertension: Secondary | ICD-10-CM | POA: Diagnosis not present

## 2019-01-12 LAB — BASIC METABOLIC PANEL
BUN: 21 mg/dL (ref 6–23)
CO2: 31 mEq/L (ref 19–32)
Calcium: 9.8 mg/dL (ref 8.4–10.5)
Chloride: 96 mEq/L (ref 96–112)
Creatinine, Ser: 0.75 mg/dL (ref 0.40–1.20)
GFR: 74.35 mL/min (ref >60.00)
Glucose, Bld: 261 mg/dL — ABNORMAL HIGH (ref 70–99)
Potassium: 4.9 mEq/L (ref 3.5–5.1)
Sodium: 135 mEq/L (ref 135–145)

## 2019-01-13 ENCOUNTER — Ambulatory Visit (HOSPITAL_COMMUNITY)
Admission: RE | Admit: 2019-01-13 | Discharge: 2019-01-13 | Disposition: A | Discharge status: Home / Self Care | Payer: Medicare HMO | Source: Ambulatory Visit | Attending: Family Medicine | Admitting: Family Medicine

## 2019-01-13 DIAGNOSIS — K769 Liver disease, unspecified: Secondary | ICD-10-CM | POA: Diagnosis not present

## 2019-01-13 DIAGNOSIS — R160 Hepatomegaly, not elsewhere classified: Secondary | ICD-10-CM

## 2019-01-13 DIAGNOSIS — E278 Other specified disorders of adrenal gland: Secondary | ICD-10-CM

## 2019-01-13 DIAGNOSIS — Z85820 Personal history of malignant melanoma of skin: Secondary | ICD-10-CM

## 2019-01-13 DIAGNOSIS — K838 Other specified diseases of biliary tract: Secondary | ICD-10-CM | POA: Diagnosis not present

## 2019-01-15 ENCOUNTER — Telehealth: Payer: Self-pay | Admitting: Gastroenterology

## 2019-01-15 NOTE — Telephone Encounter (Signed)
Patient is being referred for liver mass. Please see CT results.   Patient had an appt scheduled for 01/16/19 with Alonza Bogus, PA. Our office is having to reschedule this appt due to provider being out of office. Next available is on 02/07/19. Please advise on urgency of scheduling.

## 2019-01-15 NOTE — Telephone Encounter (Signed)
Pt is rescheduled with Tye Savoy.  She reported that she was not able to have MRI done due to Valium not being effective.

## 2019-01-15 NOTE — Telephone Encounter (Signed)
Please schedule with the next available APP.

## 2019-01-15 NOTE — Telephone Encounter (Signed)
Patient was referred to Dr. Havery Gutierrez from PCP on 01/02/19 for a Liver mass and left Adrenal mass seen on a CT. He did not have anything open in August so patient was scheduled with Meredith Gutierrez on 01/16/19. She is now out for 2 weeks and the appt. had to be cancelled. Dr. Luvenia Gutierrez is out all week on vacation. Please advise.

## 2019-01-16 ENCOUNTER — Ambulatory Visit: Payer: Medicare HMO | Admitting: Gastroenterology

## 2019-01-16 ENCOUNTER — Encounter: Payer: Self-pay | Admitting: Nurse Practitioner

## 2019-01-16 ENCOUNTER — Telehealth: Payer: Self-pay

## 2019-01-16 ENCOUNTER — Ambulatory Visit: Payer: Medicare HMO | Admitting: Nurse Practitioner

## 2019-01-16 VITALS — BP 104/70 | HR 88 | Temp 99.3°F | Ht 60.0 in | Wt 138.4 lb

## 2019-01-16 DIAGNOSIS — E278 Other specified disorders of adrenal gland: Secondary | ICD-10-CM | POA: Diagnosis not present

## 2019-01-16 DIAGNOSIS — R160 Hepatomegaly, not elsewhere classified: Secondary | ICD-10-CM

## 2019-01-16 DIAGNOSIS — R9389 Abnormal findings on diagnostic imaging of other specified body structures: Secondary | ICD-10-CM

## 2019-01-16 MED ORDER — DIAZEPAM 5 MG PO TABS: 5.0000 mg | ORAL_TABLET | Freq: Two times a day (BID) | ORAL | 0 refills | Status: DC | PRN

## 2019-01-16 NOTE — Progress Notes (Signed)
Chief Complaint:  Abnormal CT scan    IMPRESSION and PLAN:    38.  78 year old female hospitalized early July with fever, CAP Group B strep bacteremia.  Resolved.   2.  Liver mass (3.4 cm) and adrenal mass (2.6 cm), incidental findings on CTAP done for evaluation of fever in hospital. CBD and PD dilated with multiple calcifications in the pancreas, some possibly centered in the PD.  Went for MRCP/MRI this Saturday.  Given Valium to take pre-procedure but still unable to tolerate the study - ? chronic pancreatitis with dilated PD / calcifications but CBD also dilated measures to 8 mm. No pancreatic masses seen. Normal liver tests. No abdominal pain. Incidental findings though she had some mild weight loss / diminished appetite -Ideally she needs MRCP/MRI but dementia and claustrophobia complicate things. Daughter will talk with PCP about ? higher dose of valium before exam but she and I are both concerned that patient will be unable to hold her breath for the necessary amount of time required for MRCP. T  2. Weight loss, mild.  She was 145 pounds early March of this year, down to 138 pounds today   HPI:     Patient is an 78 yo female with a history of dementia , COPD, CAD/CABG, diastolic heart failure, DM, hypertension, thyroid disease, chronic constipation  Several months ago we saw the patient for dysphagia.  She is now here for evaluation of abnormal CT scan.  Patient hospitalized from July 8 through July 7 after presenting to the hospital with high fever / sepsisc on admission.  Found to have group B strep bacteremia, source possibly LLL pneumonia.  She was not symptomatic with cough or shortness of breath . Patient was treated with antibiotics.  During the process of evaluation of fever a CTAP with contrast 12/20/18 was done and incidentally showed an indeterminate 3.4 cm mass in segment 4 of the liver.  CBD and PD were dilated.  Multiple calcifications in the pancreas, some of which  were possibly centered in the pancreatic duct.  Also seen was an indeterminate 2.6 cm left adrenal nodule.  CT findings deferred for outpatient work-up.   Patient saw PCP 01/01/2019 for hospital follow-up.  He ordered an MRCP MRI for further evaluation of CTAP findings.  Patient was given a Valium to take prior to the study.  Unfortunately despite Valium she was not able to participate.  There was some difficulty getting an IV started and patient is claustrophobic.  She has no abdominal pain, these were purely incidental findings.  Her weight is down a few pounds however, daughter reports diminished appetite lately.   Review of systems:     No chest pain, no SOB, no fevers, no urinary sx   Past Medical History:  Diagnosis Date   Angina    never needed to take nitroglycerin.   Anxiety    Blood transfusion without reported diagnosis    Cerebral atherosclerosis    COPD (chronic obstructive pulmonary disease) ([facility])    02-16-13-"pt. denies this"-shows on CXR of 2- 2013.   Coronary artery disease 2003   s/p CABG 2014   Diabetes mellitus without complication ([facility]) 86/7619   NEW ONSET   Diastolic CHF, on admit treated. 08/10/2011   Family history of adverse reaction to anesthesia    SISTER GETS SICK   Fibromuscular dysplasia of renal artery ([facility])    right   Heart murmur    History of kidney stones 02-16-13   "  hx. of overgrowth of muscle-causing some stricture"renal artery stenosis-being followed [place]   Hyperlipidemia    Hypertension    Hyperthyroidism    [facility]   Hyponatremia 04/2018   Melanoma ([facility]) 2010   skin cancers, 1 melanoma removed back   Osteoarthritis     Patient's surgical history, family medical history, social history, medications and allergies were all reviewed in Epic   Serum creatinine: 0.75 mg/dL 01/12/19 1238 Estimated creatinine clearance: 46.4 mL/min  Current Outpatient Medications  Medication Sig Dispense Refill   ACCU-CHEK FASTCLIX LANCETS MISC  1 each by Does not apply route daily. Use as directed to check blood sugar once daily.  Dx code:  E11.65 100 each 0   albuterol (PROVENTIL) (2.5 MG/3ML) 0.083% nebulizer solution Take 3 mLs (2.5 mg total) by nebulization every 6 (six) hours as needed for wheezing or shortness of breath. 75 mL 1   Alcohol Swabs (B-D SINGLE USE SWABS REGULAR) PADS 1 each by Does not apply route daily. Use as directed to check blood sugar once daily.  Dx code:  E11.65 100 each 0   aspirin EC 81 MG tablet Take 81 mg by mouth daily.     Blood Glucose Monitoring Suppl (ACCU-CHEK AVIVA PLUS) w/Device KIT 1 each by Does not apply route daily. Use as directed to check blood sugar once daily.  Dx code:  E11.65 1 kit 0   candesartan (ATACAND) 16 MG tablet TAKE 1 TABLET BY MOUTH DAILY (Patient taking differently: Take 16 mg by mouth daily. ) 30 tablet 6   carboxymethylcellulose (REFRESH PLUS) 0.5 % SOLN Place 1 drop into both eyes 2 (two) times daily as needed (for irritation).      Cholecalciferol (VITAMIN D) 2000 units CAPS Take 1 capsule (2,000 Units total) by mouth daily. (Patient taking differently: Take 2,000 Units by mouth daily. ) 30 capsule    donepezil (ARICEPT) 5 MG tablet TAKE ONE TABLET BY MOUTH AT BEDTIME 30 tablet 3   furosemide (LASIX) 80 MG tablet Take 1 tablet by mouth daily (Patient taking differently: Take 80 mg by mouth daily. ) 90 tablet 1   glucose blood (ACCU-CHEK AVIVA PLUS) test strip 1 each by Other route as needed for other. Use as instructed to check blood sugar once daily.  Dx code:  E11.65 100 each 0   LORazepam (ATIVAN) 0.5 MG tablet TAKE 1 TABLET BY MOUTH AT BEDTIME AS NEEDED FOR ANXIETY OR SLEEP MAY TAKE SECOND 1/2 TABLET AS NEEDED 45 tablet 0   metFORMIN (GLUCOPHAGE-XR) 500 MG 24 hr tablet Take 2 tablets (1,000 mg total) by mouth daily with supper. 180 tablet 1   morphine (MS CONTIN) 15 MG 12 hr tablet Take 15 mg by mouth See admin instructions. Take 15 mg by mouth two times a day- 10  AM and 6 PM     morphine (MSIR) 15 MG tablet Take 15 mg by mouth See admin instructions. Take 15 mg by mouth four times a day- 6:30 AM, 7:30 AM, 1:00 PM, 3:00 PM     polyethylene glycol (MIRALAX / GLYCOLAX) 17 g packet Take 17 g by mouth daily as needed. 14 each 0   potassium chloride SA (K-DUR,KLOR-CON) 20 MEQ tablet Take 2 tablets (40 mEq total) by mouth daily. (Patient taking differently: Take 20 mEq by mouth daily. ) 180 tablet 1   senna-docusate (SENOKOT-S) 8.6-50 MG tablet Take 1 tablet by mouth at bedtime.     sodium chloride (OCEAN) 0.65 % SOLN nasal spray Place 1 spray  into both nostrils as needed for congestion.     No current facility-administered medications for this visit.     Physical Exam:     BP 104/70    Pulse 88    Temp 99.3 F (37.4 C)    Ht 5' (1.524 m)    Wt 138 lb 6.4 oz (62.8 kg)    BMI 27.03 kg/m   GENERAL:  Pleasant female in NAD PSYCH: : Cooperative, normal affect EENT:  conjunctiva pink, mucous membranes moist, neck supple without masses CARDIAC:  RRR, loud  murmur heard, no peripheral edema PULM: Normal respiratory effort, lungs CTA bilaterally, no wheezing ABDOMEN:  Nondistended, soft, nontender. No obvious masses, no hepatomegaly,  normal bowel sounds SKIN:  turgor, no lesions seen Musculoskeletal:  Normal muscle tone, normal strength NEURO: Alert and oriented x 3, no focal neurologic deficits   Tye Savoy , NP 01/16/2019, 3:15 PM

## 2019-01-16 NOTE — Telephone Encounter (Signed)
Agree. Thanks Joelene Millin

## 2019-01-16 NOTE — Telephone Encounter (Signed)
[name] left v/m(DPR signed) ;that pts MRI was rescheduled for 01/29/19 and pt is claustrophobic and will need med so pt can have MRI. Carol request cb.

## 2019-01-16 NOTE — Patient Instructions (Signed)
If you are age 80 or older, your body mass index should be between 23-30. Your Body mass index is 27.03 kg/m. If this is out of the aforementioned range listed, please consider follow up with your Primary Care Provider.  If you are age 74 or younger, your body mass index should be between 19-25. Your Body mass index is 27.03 kg/m. If this is out of the aformentioned range listed, please consider follow up with your Primary Care Provider.   You have been scheduled for an MRI at [facility] on 01/29/19. Your appointment time is 4 pm. Please arrive 30 minutes prior to your appointment time for registration purposes. Please make certain not to have anything to eat or drink 4 hours prior to your test. In addition, if you have any metal in your body, have a pacemaker or defibrillator, please be sure to let your ordering physician know. This test typically takes 45 minutes to 1 hour to complete. Should you need to reschedule, please call [phone] to do so.  Please contact primary care physician regarding Valium prescription prior to procedure.  We will call with results.  Thank you for choosing me and [facility].   Tye Savoy, NP

## 2019-01-16 NOTE — Telephone Encounter (Signed)
Did we take 1 tab or 2 of the 2mg ?  I have sent in 5mg  tablet to try for next procedure.  Caution with her narcotics and with her ativan at home - don't mix.

## 2019-01-17 NOTE — Telephone Encounter (Signed)
Spoke with pt's daughter, [name] (on dpr), asking about last rx.  Says pt took 2 tabs.  I relayed Dr. Synthia Innocent message.  [name] verbalizes understanding and expresses her thanks.

## 2019-01-19 ENCOUNTER — Encounter: Payer: Self-pay | Admitting: Family Medicine

## 2019-01-19 ENCOUNTER — Other Ambulatory Visit: Payer: Self-pay

## 2019-01-19 ENCOUNTER — Ambulatory Visit (INDEPENDENT_AMBULATORY_CARE_PROVIDER_SITE_OTHER): Payer: Medicare HMO | Admitting: Family Medicine

## 2019-01-19 VITALS — BP 124/66 | HR 90 | Temp 98.7°F | Ht 60.0 in | Wt 139.3 lb

## 2019-01-19 DIAGNOSIS — M25421 Effusion, right elbow: Secondary | ICD-10-CM | POA: Insufficient documentation

## 2019-01-19 DIAGNOSIS — M25521 Pain in right elbow: Secondary | ICD-10-CM

## 2019-01-19 MED ORDER — DOXYCYCLINE HYCLATE 100 MG PO TABS: 100.0000 mg | ORAL_TABLET | Freq: Two times a day (BID) | ORAL | 0 refills | Status: DC

## 2019-01-19 NOTE — Assessment & Plan Note (Signed)
Possible traumatic olecranon bursitis that had drained rapidly on its own.  Given erythema and warmth will cover for infection with 7d doxy course. Discussed photosensitivity.  Update if not improving with treatment.

## 2019-01-19 NOTE — Progress Notes (Signed)
This visit was conducted in person.  BP 124/66 (BP Location: Left Arm, Patient Position: Sitting, Cuff Size: Normal)   Pulse 90   Temp 98.7 F (37.1 C) (Temporal)   Ht 5' (1.524 m)   Wt 139 lb 5 oz (63.2 kg)   SpO2 98%   BMI 27.21 kg/m    CC: R elbow pain/swelling Subjective:    Patient ID: Meredith Gutierrez, female    DOB: Apr 10, 1941, 78 y.o.   MRN: [id number]  HPI: Meredith Gutierrez is a 78 y.o. female presenting on 01/19/2019 for Joint Swelling (C/o right elbow swelling and pain.  Area has been warm to touch.  Started 01/17/19.  Pt accomapnied by daughter, [name]. )   2d h/o R elbow pain and swelling. Acte onset pain, swelling, warmth to olecranon area with small bruise proximally, then next day swelling spread down arm, now today symptoms largely improved but persistent pain to palpation at olecranon. No fevers/chills. Unknown if injury/fall.   Saw GI - did not tolerate MRCP despite valium 14m. Planned MRI 02/06/2019 for further evaluation of liver, adrenal masses and CBD dilation.      Relevant past medical, surgical, family and social history reviewed and updated as indicated. Interim medical history since our last visit reviewed. Allergies and medications reviewed and updated. Outpatient Medications Prior to Visit  Medication Sig Dispense Refill  . ACCU-CHEK FASTCLIX LANCETS MISC 1 each by Does not apply route daily. Use as directed to check blood sugar once daily.  Dx code:  E11.65 100 each 0  . albuterol (PROVENTIL) (2.5 MG/3ML) 0.083% nebulizer solution Take 3 mLs (2.5 mg total) by nebulization every 6 (six) hours as needed for wheezing or shortness of breath. 75 mL 1  . Alcohol Swabs (B-D SINGLE USE SWABS REGULAR) PADS 1 each by Does not apply route daily. Use as directed to check blood sugar once daily.  Dx code:  E11.65 100 each 0  . aspirin EC 81 MG tablet Take 81 mg by mouth daily.    . Blood Glucose Monitoring Suppl (ACCU-CHEK AVIVA PLUS) w/Device KIT 1 each by Does not  apply route daily. Use as directed to check blood sugar once daily.  Dx code:  E11.65 1 kit 0  . candesartan (ATACAND) 16 MG tablet TAKE 1 TABLET BY MOUTH DAILY (Patient taking differently: Take 16 mg by mouth daily. ) 30 tablet 6  . carboxymethylcellulose (REFRESH PLUS) 0.5 % SOLN Place 1 drop into both eyes 2 (two) times daily as needed (for irritation).     . Cholecalciferol (VITAMIN D) 2000 units CAPS Take 1 capsule (2,000 Units total) by mouth daily. (Patient taking differently: Take 2,000 Units by mouth daily. ) 30 capsule   . diazepam (VALIUM) 5 MG tablet Take 1 tablet (5 mg total) by mouth every 12 (twelve) hours as needed for anxiety or sedation (for MRI). 3 tablet 0  . donepezil (ARICEPT) 5 MG tablet TAKE ONE TABLET BY MOUTH AT BEDTIME 30 tablet 3  . furosemide (LASIX) 80 MG tablet Take 1 tablet by mouth daily (Patient taking differently: Take 80 mg by mouth daily. ) 90 tablet 1  . glucose blood (ACCU-CHEK AVIVA PLUS) test strip 1 each by Other route as needed for other. Use as instructed to check blood sugar once daily.  Dx code:  E11.65 100 each 0  . LORazepam (ATIVAN) 0.5 MG tablet TAKE 1 TABLET BY MOUTH AT BEDTIME AS NEEDED FOR ANXIETY OR SLEEP MAY TAKE SECOND 1/2 TABLET AS  NEEDED 45 tablet 0  . metFORMIN (GLUCOPHAGE-XR) 500 MG 24 hr tablet Take 2 tablets (1,000 mg total) by mouth daily with supper. 180 tablet 1  . morphine (MS CONTIN) 15 MG 12 hr tablet Take 15 mg by mouth See admin instructions. Take 15 mg by mouth two times a day- 10 AM and 6 PM    . morphine (MSIR) 15 MG tablet Take 15 mg by mouth See admin instructions. Take 15 mg by mouth four times a day- 6:30 AM, 7:30 AM, 1:00 PM, 3:00 PM    . polyethylene glycol (MIRALAX / GLYCOLAX) 17 g packet Take 17 g by mouth daily as needed. 14 each 0  . potassium chloride SA (K-DUR,KLOR-CON) 20 MEQ tablet Take 2 tablets (40 mEq total) by mouth daily. (Patient taking differently: Take 20 mEq by mouth daily. ) 180 tablet 1  . senna-docusate  (SENOKOT-S) 8.6-50 MG tablet Take 1 tablet by mouth at bedtime.    . sodium chloride (OCEAN) 0.65 % SOLN nasal spray Place 1 spray into both nostrils as needed for congestion.     No facility-administered medications prior to visit.      Per HPI unless specifically indicated in ROS section below Review of Systems Objective:    BP 124/66 (BP Location: Left Arm, Patient Position: Sitting, Cuff Size: Normal)   Pulse 90   Temp 98.7 F (37.1 C) (Temporal)   Ht 5' (1.524 m)   Wt 139 lb 5 oz (63.2 kg)   SpO2 98%   BMI 27.21 kg/m   Wt Readings from Last 3 Encounters:  01/19/19 139 lb 5 oz (63.2 kg)  01/16/19 138 lb 6.4 oz (62.8 kg)  01/01/19 138 lb 1.6 oz (62.6 kg)    Physical Exam Vitals signs and nursing note reviewed.  Constitutional:      Appearance: Normal appearance. She is not Gutierrez-appearing.  Musculoskeletal: Normal range of motion.        General: Tenderness present. No swelling.     Comments:  2+ rad pulses FROM bilateral elbows R posterior elbow into medial forearm redness, tenderness, warmth No obvious olecranon bursitis but tender overlying olecranon  Skin:    General: Skin is warm.     Findings: Erythema present.  Neurological:     Mental Status: She is alert.       Results for orders placed or performed in visit on 16/10/96  Basic metabolic panel  Result Value Ref Range   Sodium 135 135 - 145 mEq/L   Potassium 4.9 3.5 - 5.1 mEq/L   Chloride 96 96 - 112 mEq/L   CO2 31 19 - 32 mEq/L   Glucose, Bld 261 (H) 70 - 99 mg/dL   BUN 21 6 - 23 mg/dL   Creatinine, Ser 0.75 0.40 - 1.20 mg/dL   Calcium 9.8 8.4 - 10.5 mg/dL   GFR 74.35 >60.00 mL/min   Assessment & Plan:   Problem List Items Addressed This Visit    Pain and swelling of right elbow - Primary    Possible traumatic olecranon bursitis that had drained rapidly on its own.  Given erythema and warmth will cover for infection with 7d doxy course. Discussed photosensitivity.  Update if not improving with  treatment.           Meds ordered this encounter  Medications  . doxycycline (VIBRA-TABS) 100 MG tablet    Sig: Take 1 tablet (100 mg total) by mouth 2 (two) times daily.    Dispense:  14 tablet  Refill:  0   No orders of the defined types were placed in this encounter.   Follow up plan: No follow-ups on file.  Ria Bush, MD

## 2019-01-19 NOTE — Patient Instructions (Addendum)
Possible bursitis that has drained on its own. Given redness and warmth, I do want to cover for infection with doxycycline 7d course - take with food, avoid too much sun while this medicine.

## 2019-01-23 ENCOUNTER — Encounter: Payer: Self-pay | Admitting: Nurse Practitioner

## 2019-01-24 ENCOUNTER — Other Ambulatory Visit: Payer: Self-pay

## 2019-01-24 ENCOUNTER — Telehealth: Payer: Self-pay

## 2019-01-24 DIAGNOSIS — E278 Other specified disorders of adrenal gland: Secondary | ICD-10-CM

## 2019-01-24 NOTE — Progress Notes (Signed)
Agree with assessment with the following thoughts.  I spoke with Dr. Rush Landmark of advanced endoscopy about this case, to see if the liver lesion was amenable to EUS where it may be evaluated and potentially biopsied, but the lesion appears higher up than may be reachable via EUS. If the patient can have an MRI in any way that would be the best way to proceed. If she truly cannot lie with oral benzos, we can see if anesthesia would be able to sedate her for the MRI as a last resort.   Sherlynn Stalls, can you please touch base with MRI and see if there is an option for anesthesia to sedate a patient for MRI so they can lie still in order to get a high quality exam? Can you please let me know? Thanks

## 2019-01-24 NOTE — Telephone Encounter (Signed)
Left message for patient or daughter to please call back

## 2019-01-24 NOTE — Telephone Encounter (Signed)
Pt's daughter returned call, pls call her again.

## 2019-01-25 ENCOUNTER — Telehealth: Payer: Self-pay

## 2019-01-25 NOTE — Telephone Encounter (Signed)
Patient scheduled for MRI at [facility] with anesthesia on 02/22/19 @ 8:00am. Patient to arrive at 6:00am. NPO after midnight. Wear a mask. Need a driver. Also scheduled for COVID testing 02/20/19 at 10:00am (patient to get in yellow lane by 9:45am). [place] Being done Tues. because on Monday being Labor Day. Scheduling to fax H & P form to be filled out by Dr. since patient is getting some anesthesia. Stated MRI will not be done if not filled out and sent back.

## 2019-01-25 NOTE — Telephone Encounter (Signed)
See phone note

## 2019-01-25 NOTE — Telephone Encounter (Signed)
-----   Message from Marlon Pel, RN sent at 01/25/2019 10:32 AM EDT ----- Regarding: FW: MR  ----- Message ----- From: Darden Dates Sent: 01/25/2019  10:14 AM EDT To: Marlon Pel, RN, Oliva Bustard Subject: MR                                             Sheri, I called central scheduling and spoke to [facility] after you called me (she's the one that scheduled the appt).  She told me she couldn't cancel the order because she set up everything according to the set up Dr. Havery Moros ordered even though it was the same cpt code ordered.  So I called [facility] and told the rep what the situation was and she said she could go ahead and cancel Dr. Phineas Semen beings the procedure had not been performed, and that she would have to start a new case under Dr. Havery Moros after that.  We started a new case over the phone, and I obtained a new auth good from 02/22/19 to 01/22/19.  I called Nitasha back and gave her all info. All documented in our referral. Thanks, Amy

## 2019-01-25 NOTE — Telephone Encounter (Signed)
Thanks very much Sherlynn Stalls for helping to coordinate this, I appreciate it. Can you please make sure the MRI is protocol for MRI abdomen / MRCP. Thanks again

## 2019-01-25 NOTE — Telephone Encounter (Signed)
Addendum: daughter called and notified of all the details of the MRI and COVID testing and in agreement

## 2019-01-26 ENCOUNTER — Telehealth: Payer: Self-pay

## 2019-01-26 NOTE — Telephone Encounter (Signed)
Called Radiology and they were able to add on the MRCP as part of the MRI on 02/22/19 at 8:00am

## 2019-01-26 NOTE — Telephone Encounter (Signed)
Okay great, thanks

## 2019-01-26 NOTE — Telephone Encounter (Signed)
-----   Message from Yetta Flock, MD sent at 01/25/2019  5:24 PM EDT ----- Thanks for the note Kizer Nobbe. Sorry I should have been clearer earlier. She has a indeterminate lesion in the liver and adrenal gland, needs the MRI with and without contrast of the abdomen to evaluate that. She also has some abnormalities in her pancreas and needs the MRCP as well. Usually in this situation when both are needed they can do the MRI abdomen and MRCP at the same time, so hopefully they can do this, I don't think it will add too much to the exam but the order needs to be in for both. Hope this is okay, if not let me know, sorry this is a lot to coordinate for this patient and I appreciate your help.    ----- Message ----- From: Hughie Closs, RN Sent: 01/25/2019   1:06 PM EDT To: Yetta Flock, MD  Dr. Havery Moros,  I guess I need clarification and want to fill you in on what has been going on with the scheduling of this. When I got the message from you  as "cc'd charts" It just mentioned an MRI. When I looked into patient's chart there were already 2 orders. One from Goldsby, J. for MRI/MRCP  and one from Tye Savoy for just MRI. Nevin Bloodgood and I talked at the end of the day yesterday and from your note we thought you just wanted the MRI, thinking the patient couldn't take deep breaths while lying flat and because she was now going to get IV sedation. Several people have been involved in trying to get this right for the patient, including Amy Hazewood and Sheri. Please see phone note from today at 10:37am. If I need to make another change to the scheduling, I just want to make sure I have it right.  Thanks  [employer]

## 2019-01-29 ENCOUNTER — Ambulatory Visit (HOSPITAL_COMMUNITY): Payer: Medicare HMO

## 2019-02-05 ENCOUNTER — Other Ambulatory Visit (INDEPENDENT_AMBULATORY_CARE_PROVIDER_SITE_OTHER): Payer: Medicare HMO

## 2019-02-05 ENCOUNTER — Other Ambulatory Visit: Payer: Self-pay | Admitting: Family Medicine

## 2019-02-05 DIAGNOSIS — E1165 Type 2 diabetes mellitus with hyperglycemia: Secondary | ICD-10-CM | POA: Diagnosis not present

## 2019-02-05 DIAGNOSIS — IMO0001 Reserved for inherently not codable concepts without codable children: Secondary | ICD-10-CM

## 2019-02-05 LAB — BASIC METABOLIC PANEL
BUN: 25 mg/dL — ABNORMAL HIGH (ref 6–23)
CO2: 29 mEq/L (ref 19–32)
Calcium: 9.4 mg/dL (ref 8.4–10.5)
Chloride: 97 mEq/L (ref 96–112)
Creatinine, Ser: 0.78 mg/dL (ref 0.40–1.20)
GFR: 71.05 mL/min (ref >60.00)
Glucose, Bld: 333 mg/dL — ABNORMAL HIGH (ref 70–99)
Potassium: 4.5 mEq/L (ref 3.5–5.1)
Sodium: 136 mEq/L (ref 135–145)

## 2019-02-06 ENCOUNTER — Ambulatory Visit (HOSPITAL_COMMUNITY): Payer: Medicare HMO

## 2019-02-09 ENCOUNTER — Other Ambulatory Visit: Payer: Self-pay | Admitting: Family Medicine

## 2019-02-09 MED ORDER — METFORMIN HCL ER 500 MG PO TB24: ORAL_TABLET | ORAL | 1 refills | Status: DC

## 2019-02-14 ENCOUNTER — Other Ambulatory Visit: Payer: Self-pay

## 2019-02-14 ENCOUNTER — Ambulatory Visit (INDEPENDENT_AMBULATORY_CARE_PROVIDER_SITE_OTHER): Payer: Medicare HMO | Admitting: Cardiovascular Disease

## 2019-02-14 ENCOUNTER — Encounter: Payer: Self-pay | Admitting: Cardiovascular Disease

## 2019-02-14 VITALS — BP 132/82 | HR 74 | Temp 97.3°F | Ht 64.0 in | Wt 138.2 lb

## 2019-02-14 DIAGNOSIS — I2510 Atherosclerotic heart disease of native coronary artery without angina pectoris: Secondary | ICD-10-CM | POA: Diagnosis not present

## 2019-02-14 DIAGNOSIS — I700 Atherosclerosis of aorta: Secondary | ICD-10-CM | POA: Diagnosis not present

## 2019-02-14 DIAGNOSIS — Z8679 Personal history of other diseases of the circulatory system: Secondary | ICD-10-CM | POA: Diagnosis not present

## 2019-02-14 DIAGNOSIS — E119 Type 2 diabetes mellitus without complications: Secondary | ICD-10-CM | POA: Diagnosis not present

## 2019-02-14 DIAGNOSIS — E7800 Pure hypercholesterolemia, unspecified: Secondary | ICD-10-CM

## 2019-02-14 DIAGNOSIS — I5032 Chronic diastolic (congestive) heart failure: Secondary | ICD-10-CM

## 2019-02-14 DIAGNOSIS — E871 Hypo-osmolality and hyponatremia: Secondary | ICD-10-CM | POA: Diagnosis not present

## 2019-02-14 DIAGNOSIS — I10 Essential (primary) hypertension: Secondary | ICD-10-CM | POA: Diagnosis not present

## 2019-02-14 MED ORDER — POTASSIUM CHLORIDE CRYS ER 20 MEQ PO TBCR: EXTENDED_RELEASE_TABLET | ORAL | 5 refills | Status: DC

## 2019-02-14 MED ORDER — FUROSEMIDE 40 MG PO TABS: ORAL_TABLET | ORAL | 5 refills | Status: DC

## 2019-02-14 NOTE — Patient Instructions (Signed)
Medication Instructions:  CHANGE how you take the Furosemide:   -Take Furosemide 120 mg once daily for a weight 135 pounds and over  -Take Furosemide 80 mg once daily for a weight of 134 and below. If your weight is 132 or less call the office [phone])  CHANGE how you take Potassium:  -Take 60 mEq of the Potassium when you take the 120 mg of the Furosemide  -Take 40 mEq of the Potassium when you take the 80 mg of the Furosemide  If you need a refill on your cardiac medications before your next appointment, please call your pharmacy.   Lab work: None ordered If you have labs (blood work) drawn today and your tests are completely normal, you will receive your results only by: Marland Kitchen MyChart Message (if you have MyChart) OR . A paper copy in the mail If you have any lab test that is abnormal or we need to change your treatment, we will call you to review the results.  Testing/Procedures: None ordered  Follow-Up: At [facility], you and your health needs are our priority.  As part of our continuing mission to provide you with exceptional heart care, we have created designated Provider Care Teams.  These Care Teams include your primary Cardiologist (physician) and Advanced Practice Providers (APPs -  Physician Assistants and Nurse Practitioners) who all work together to provide you with the care you need, when you need it. You will need a follow up appointment in 6 months.  Please call our office 2 months in advance to schedule this appointment.  You may see Sanda Klein, MD or one of the following Advanced Practice Providers on your designated Care Team: [place], [place] . Fabian Sharp, PA-C

## 2019-02-14 NOTE — Progress Notes (Signed)
[facility] Office Note    Date:  02/15/2019   ID:  Meredith Gutierrez, DOB [birth year], MRN [id number]  PCP:  Ria Bush, MD  Cardiologist:   Sanda Klein, MD   Chief Complaint  Patient presents with  . Edema    History of Present Illness:  Meredith Gutierrez is a 78 y.o. female with CAD S/P CABG (2003 LIMA to LAD and SVG to first diagonal, ligation of large LAD artery aneurysm, atretic LIMA with patent LAD at cardiac cath in 2013), without coronary events since that time. She has hyperdynamic left ventricular systolic function, moderate LVH, chronic diastolic heart failure, hyperlipidemia intolerant to numerous attempts at lipid lowering therapy (rosuvastatin, simvastatin, Zetia and others). She is unable to take beta blockers due to bradycardia.   The most part she has done well since her last appointment but recently has developed edema.  She does not have orthopnea or PND.  She has had previous problems with hyponatremia/hyperkalemia and a difficult balance between renal insufficiency and heart failure.  It appears that she has lost some true weight.  We had previously estimated her dry weight at about 145 pounds, but today she only weighs 138 pounds and is clearly edematous.  Labs performed roughly a week ago show sodium 136, potassium of 4.5 and a creatinine of 0.78.  Her daughter reports that her mother's blood sugar has been running very high, often in the 300 range.  She is taking metformin only.  Her memory problems continue to deteriorate.  She can answer a few questions today but mostly defers the answers to her daughter.  She has not had angina, palpitations, syncope or dizziness, falls or injuries, bleeding problems, claudication or focal neurological complaints.  She was previously diagnosed as having renal artery stenosis due to fibromuscular dysplasia, but the most recent duplex ultrasound studies performed in 2016 and 2018 did not show meaningful stenosis.  Past  Medical History:  Diagnosis Date  . Angina    never needed to take nitroglycerin.  . Anxiety   . Blood transfusion without reported diagnosis   . Cerebral atherosclerosis   . COPD (chronic obstructive pulmonary disease) ([place])    02-16-13-"pt. denies this"-shows on CXR of 2- 2013.  Marland Kitchen Coronary artery disease 2003   s/p CABG 2014  . Diabetes mellitus without complication ([place]) 31/4970   NEW ONSET  . Diastolic CHF, on admit treated. 08/10/2011  . Family history of adverse reaction to anesthesia    SISTER GETS SICK  . Fibromuscular dysplasia of renal artery (HCC)    right  . Heart murmur   . History of kidney stones 02-16-13   "hx. of overgrowth of muscle-causing some stricture"renal artery stenosis-being followed [place]  . Hyperlipidemia   . Hypertension   . Hyperthyroidism    [place]  . Hyponatremia 04/2018  . Melanoma ([place]) 2010   skin cancers, 1 melanoma removed back  . Osteoarthritis     Past Surgical History:  Procedure Laterality Date  . ABDOMINAL HYSTERECTOMY  1970   heavy bleeding  . APPENDECTOMY  1960s  . CARDIAC CATHETERIZATION  08/09/2011   no sign. coronary obstruction. LIMA is atretic w/excellent flow down the native LAD  . CARDIAC CATHETERIZATION  08/22/2001   no sign. coronary stenosis,  . CATARACT EXTRACTION, BILATERAL Bilateral   . CESAREAN SECTION    . CORONARY ARTERY BYPASS GRAFT  02-16-13   3'03-no bypasses -Had 2 aneurysm repairs(stents used)  . ESOPHAGOGASTRODUODENOSCOPY  04/2018   LA Grade A esophagitis,  small nodule distal esophagus (benign schwann cell hamartoma), empiric dilation to 48m (Armbruster)  . LAPAROTOMY N/A 02/20/2013   Procedure: EXPLORATORY LAPAROTOMY;  WJanie Morning MD  . LEFT HEART CATHETERIZATION WITH CORONARY ANGIOGRAM N/A 08/09/2011   Procedure: LEFT HEART CATHETERIZATION WITH CORONARY ANGIOGRAM;  Surgeon: MSanda Klein MD;  [facility];  Service: Cardiovascular;  Laterality: N/A;  . NM MYOCAR PERF WALL MOTION  07/20/2010    normal  . SALPINGOOPHORECTOMY Bilateral 02/20/2013   benign seromucinous cystadenofibroma R ovary ([name] MD)  . TOOTH EXTRACTION    . TOTAL HIP ARTHROPLASTY Left 2015    Current Medications: Outpatient Medications Prior to Visit  Medication Sig Dispense Refill  . ACCU-CHEK FASTCLIX LANCETS MISC 1 each by Does not apply route daily. Use as directed to check blood sugar once daily.  Dx code:  E11.65 100 each 0  . albuterol (PROVENTIL) (2.5 MG/3ML) 0.083% nebulizer solution Take 3 mLs (2.5 mg total) by nebulization every 6 (six) hours as needed for wheezing or shortness of breath. 75 mL 1  . Alcohol Swabs (B-D SINGLE USE SWABS REGULAR) PADS 1 each by Does not apply route daily. Use as directed to check blood sugar once daily.  Dx code:  E11.65 100 each 0  . aspirin EC 81 MG tablet Take 81 mg by mouth daily.    . Blood Glucose Monitoring Suppl (ACCU-CHEK AVIVA PLUS) w/Device KIT 1 each by Does not apply route daily. Use as directed to check blood sugar once daily.  Dx code:  E11.65 1 kit 0  . candesartan (ATACAND) 16 MG tablet TAKE 1 TABLET BY MOUTH DAILY (Patient taking differently: Take 16 mg by mouth daily. ) 30 tablet 6  . carboxymethylcellulose (REFRESH PLUS) 0.5 % SOLN Place 1 drop into both eyes 2 (two) times daily as needed (for irritation).     . Cholecalciferol (VITAMIN D) 2000 units CAPS Take 1 capsule (2,000 Units total) by mouth daily. (Patient taking differently: Take 2,000 Units by mouth daily. ) 30 capsule   . diazepam (VALIUM) 5 MG tablet Take 1 tablet (5 mg total) by mouth every 12 (twelve) hours as needed for anxiety or sedation (for MRI). 3 tablet 0  . donepezil (ARICEPT) 5 MG tablet TAKE ONE TABLET BY MOUTH AT BEDTIME 30 tablet 3  . doxycycline (VIBRA-TABS) 100 MG tablet Take 1 tablet (100 mg total) by mouth 2 (two) times daily. 14 tablet 0  . glucose blood (ACCU-CHEK AVIVA PLUS) test strip 1 each by Other route as needed for other. Use as instructed to check blood sugar  once daily.  Dx code:  E11.65 100 each 0  . LORazepam (ATIVAN) 0.5 MG tablet TAKE 1 TABLET BY MOUTH AT BEDTIME AS NEEDED FOR ANXIETY OR SLEEP MAY TAKE SECOND 1/2 TABLET AS NEEDED 45 tablet 0  . metFORMIN (GLUCOPHAGE-XR) 500 MG 24 hr tablet Take 1 tablet with breakfast and 2 tablets with dinner 270 tablet 1  . morphine (MS CONTIN) 15 MG 12 hr tablet Take 15 mg by mouth See admin instructions. Take 15 mg by mouth two times a day- 10 AM and 6 PM    . morphine (MSIR) 15 MG tablet Take 15 mg by mouth See admin instructions. Take 15 mg by mouth four times a day- 6:30 AM, 7:30 AM, 1:00 PM, 3:00 PM    . polyethylene glycol (MIRALAX / GLYCOLAX) 17 g packet Take 17 g by mouth daily as needed. 14 each 0  . senna-docusate (SENOKOT-S) 8.6-50 MG  tablet Take 1 tablet by mouth at bedtime.    . sodium chloride (OCEAN) 0.65 % SOLN nasal spray Place 1 spray into both nostrils as needed for congestion.    . furosemide (LASIX) 80 MG tablet Take 1 tablet by mouth daily (Patient taking differently: Take 80 mg by mouth daily. ) 90 tablet 1  . potassium chloride SA (K-DUR,KLOR-CON) 20 MEQ tablet Take 2 tablets (40 mEq total) by mouth daily. (Patient taking differently: Take 20 mEq by mouth daily. ) 180 tablet 1   No facility-administered medications prior to visit.      Allergies:   Codeine, Penicillin g, Penicillins, Prednisolone acetate, Prednisone, Meloxicam, Nitroglycerin, Adhesive [tape], Crestor [rosuvastatin calcium], Lopressor [metoprolol tartrate], Sulfamethoxazole, and Zetia [ezetimibe]   Social History   Socioeconomic History  . Marital status: Widowed    Spouse name: Not on file  . Number of children: Not on file  . Years of education: Not on file  . Highest education level: Not on file  Occupational History  . Not on file  Social Needs  . Financial resource strain: Not on file  . Food insecurity    Worry: Not on file    Inability: Not on file  . Transportation needs    Medical: Not on file     Non-medical: Not on file  Tobacco Use  . Smoking status: Former Smoker    Packs/day: 0.10    Years: 20.00    Pack years: 2.00    Types: Cigarettes    Quit date: 09/14/2017    Years since quitting: 1.4  . Smokeless tobacco: Never Used  . Tobacco comment: smoking cessation info given  Substance and Sexual Activity  . Alcohol use: No  . Drug use: No  . Sexual activity: Never  Lifestyle  . Physical activity    Days per week: Not on file    Minutes per session: Not on file  . Stress: Not on file  Relationships  . Social Herbalist on phone: Not on file    Gets together: Not on file    Attends religious service: Not on file    Active member of club or organization: Not on file    Attends meetings of clubs or organizations: Not on file    Relationship status: Not on file  Other Topics Concern  . Not on file  Social History Narrative   Widow   Lives with daughter and daughter's GF   Edu: HS   Occ: retired, worked at [employer]   Activity: no regular exercise   Diet: good water      Family History:  The patient's family history includes Breast cancer in her maternal aunt; Colon polyps in her maternal grandfather; Coronary artery disease in her father; Dementia (age of onset: 26) in her mother; Diabetes in her brother; Hypertension in her brother and father; Hypotension in her mother and sister; Lung cancer in her daughter; Stroke in her mother; Thyroid cancer in her daughter.   ROS:   Please see the history of present illness.    ROS all other systems are reviewed and are negative with assistance from the patient's daughter PHYSICAL EXAM:   VS:  BP 132/82   Pulse 74   Temp (!) 97.3 F (36.3 C)   Ht _0  (1.626 m)   Wt 138 lb 3.2 oz (62.7 kg)   SpO2 98%   BMI 23.72 kg/m      General: Alert, oriented x3, no distress, smiling,  appears comfortable Head: no evidence of trauma, PERRL, EOMI, no exophtalmos or lid lag, no myxedema, no xanthelasma; normal ears, nose and  oropharynx Neck: 5-6 cm jugular venous pulsations and moderate hepatojugular reflux; brisk carotid pulses without delay and no carotid bruits Chest: clear to auscultation, no signs of consolidation by percussion or palpation, normal fremitus, symmetrical and full respiratory excursions Cardiovascular: normal position and quality of the apical impulse, regular rhythm, normal first and second heart sounds, no murmurs, rubs or gallops Abdomen: no tenderness or distention, no masses by palpation, no abnormal pulsatility or arterial bruits, normal bowel sounds, no hepatosplenomegaly Extremities: no clubbing, cyanosis; symmetrical 2+ edema to above the ankles bilaterally; 2+ radial, ulnar and brachial pulses bilaterally; 2+ right femoral, posterior tibial and dorsalis pedis pulses; 2+ left femoral, posterior tibial and dorsalis pedis pulses; no subclavian or femoral bruits Neurological: grossly nonfocal Psych: Normal mood and affect    Wt Readings from Last 3 Encounters:  02/14/19 138 lb 3.2 oz (62.7 kg)  01/19/19 139 lb 5 oz (63.2 kg)  01/16/19 138 lb 6.4 oz (62.8 kg)      Studies/Labs Reviewed:   ECHO 09/01/2017: - Left ventricle: The cavity size was normal. There was mild   concentric hypertrophy. Systolic function was normal. The   estimated ejection fraction was in the range of 55% to 60%. Wall   motion was normal; there were no regional wall motion   abnormalities. Doppler parameters are consistent with abnormal   left ventricular relaxation (grade 1 diastolic dysfunction). - Aortic valve: Trileaflet; moderately thickened, severely   calcified leaflets. Transvalvular velocity was within the normal   range. There was no stenosis. E/e' was 14.68  EKG:  EKG is not ordered today.   Recent Labs: 04/18/2018: NT-Pro BNP 235 05/08/2018: Magnesium 1.9 12/20/2018: ALT 16; TSH 0.534 01/01/2019: Hemoglobin 12.8; Platelets 405.0 02/05/2019: BUN 25; Creatinine, Ser 0.78; Potassium 4.5; Sodium  136   Lipid Panel    Component Value Date/Time   CHOL 184 04/07/2018 0841   TRIG 286.0 (H) 04/07/2018 0841   HDL 36.40 (L) 04/07/2018 0841   CHOLHDL 5 04/07/2018 0841   VLDL 57.2 (H) 04/07/2018 0841   LDLCALC 123 02/06/2016   LDLDIRECT 122.0 04/07/2018 0841    ASSESSMENT:    1. Chronic diastolic heart failure ([place])   2. Hyponatremia   3. Essential hypertension   4. History of renal artery stenosis   5. Coronary artery disease involving native coronary artery of native heart without angina pectoris   6. Hypercholesterolemia   7. Controlled type 2 diabetes mellitus without complication, without long-term current use of insulin ([place])   8. Aortic atherosclerosis (HCC)      PLAN:  In order of problems listed above:  1. CHF: She has clinical signs of hypervolemia but does not have shortness of breath (she is very sedentary).  We will reset her "dry weight" to 135 pounds, but avoid dropping her weight less than 132 pounds due to the risk of electrolyte imbalances.  She is clinically euvolemic and appears to have NYHA functional class II (hard to assess due to sedentary status).  It is possible that she has cardiac amyloidosis, but I think her neurological problems limit the benefit she would get from aggressive work-up and treatment for this disorder.  We will increase the dose of furosemide to 120 mg daily on days when she weighs 135 pounds or greater, otherwise stay on 80 mg once daily. 2. Hyponatremia: Resolved. 3. HTN: Well-controlled.  Intolerant to  beta-blockers due to bradycardia 4. History of left renal artery stenosis: No stenosis over 60% on studies from 2016 and 2018 5. CAD: Does not have angina pectoris (with sedentary lifestyle).  Low risk anatomy at most recent cardiac catheterization. 6. HLP: She did not tolerate multiple statins, had no benefit from red yeast rice.  Due to cognitive deterioration we have decided not to pursue lipid-lowering therapy with PCSK9 inhibitors.  7. DM: Glycemic control has deteriorated.  She might be a great candidate for SGLT2 inhibitors like Farxiga or Jardiance, but will require close monitoring of her sodium level.  We will coordinate with Dr. Danise Mina. 8. Ao athero: Statin intolerant.  Medication Adjustments/Labs and Tests Ordered: Current medicines are reviewed at length with the patient today.  Concerns regarding medicines are outlined above.  Medication changes, Labs and Tests ordered today are listed in the Patient Instructions below. Patient Instructions  Medication Instructions:  CHANGE how you take the Furosemide:   -Take Furosemide 120 mg once daily for a weight 135 pounds and over  -Take Furosemide 80 mg once daily for a weight of 134 and below. If your weight is 132 or less call the office [phone])  CHANGE how you take Potassium:  -Take 60 mEq of the Potassium when you take the 120 mg of the Furosemide  -Take 40 mEq of the Potassium when you take the 80 mg of the Furosemide  If you need a refill on your cardiac medications before your next appointment, please call your pharmacy.   Lab work: None ordered If you have labs (blood work) drawn today and your tests are completely normal, you will receive your results only by: Marland Kitchen MyChart Message (if you have MyChart) OR . A paper copy in the mail If you have any lab test that is abnormal or we need to change your treatment, we will call you to review the results.  Testing/Procedures: None ordered  Follow-Up: At [facility], you and your health needs are our priority.  As part of our continuing mission to provide you with exceptional heart care, we have created designated Provider Care Teams.  These Care Teams include your primary Cardiologist (physician) and Advanced Practice Providers (APPs -  Physician Assistants and Nurse Practitioners) who all work together to provide you with the care you need, when you need it. You will need a follow up appointment in 6  months.  Please call our office 2 months in advance to schedule this appointment.  You may see Sanda Klein, MD or one of the following Advanced Practice Providers on your designated Care Team: [place], [place] . Fabian Sharp, PA-C      Signed, Sanda Klein, MD  02/15/2019 10:43 AM    [facility] [address], [place], [place]  [address] Phone: [phone]; Fax: [phone]

## 2019-02-15 ENCOUNTER — Encounter: Payer: Self-pay | Admitting: Cardiovascular Disease

## 2019-02-20 ENCOUNTER — Other Ambulatory Visit (HOSPITAL_COMMUNITY)
Admission: RE | Admit: 2019-02-20 | Discharge: 2019-02-20 | Disposition: A | Discharge status: Home / Self Care | Payer: Medicare HMO | Source: Ambulatory Visit | Attending: Gastroenterology | Admitting: Gastroenterology

## 2019-02-20 ENCOUNTER — Encounter (HOSPITAL_COMMUNITY): Payer: Self-pay | Admitting: *Deleted

## 2019-02-20 DIAGNOSIS — Z01812 Encounter for preprocedural laboratory examination: Secondary | ICD-10-CM | POA: Insufficient documentation

## 2019-02-20 DIAGNOSIS — Z20828 Contact with and (suspected) exposure to other viral communicable diseases: Secondary | ICD-10-CM | POA: Diagnosis not present

## 2019-02-20 LAB — SARS CORONAVIRUS 2 (TAT 6-24 HRS): SARS Coronavirus 2: NEGATIVE

## 2019-02-20 NOTE — Progress Notes (Signed)
Daughter states patient has not mentioned any chest pain or shob. Per daughter patient cannot answer any questions.

## 2019-02-20 NOTE — Anesthesia Preprocedure Evaluation (Addendum)
Anesthesia Evaluation  Patient identified by MRN, date of birth, ID band Patient awake and Patient confused    Reviewed: Allergy & Precautions, NPO status , Patient's Chart, lab work & pertinent test results  History of Anesthesia Complications Negative for: history of anesthetic complications  Airway Mallampati: I  TM Distance: >3 FB Neck ROM: Full    Dental  (+) Edentulous Upper, Edentulous Lower   Pulmonary COPD, former smoker,    Pulmonary exam normal        Cardiovascular hypertension, + CAD, + CABG and +CHF  Normal cardiovascular exam     Neuro/Psych PSYCHIATRIC DISORDERS Anxiety Dementia negative neurological ROS     GI/Hepatic negative GI ROS, Neg liver ROS,   Endo/Other  diabetes, Poorly Controlled, Oral Hypoglycemic AgentsHyperthyroidism   Renal/GU negative Renal ROS  negative genitourinary   Musculoskeletal negative musculoskeletal ROS (+)   Abdominal   Peds  Hematology negative hematology ROS (+)   Anesthesia Other Findings   Reproductive/Obstetrics                           Anesthesia Physical Anesthesia Plan  ASA: III  Anesthesia Plan: General   Post-op Pain Management:    Induction: Intravenous  PONV Risk Score and Plan: Ondansetron, Dexamethasone, Treatment may vary due to age or medical condition and Midazolam  Airway Management Planned: Oral ETT and LMA  Additional Equipment: None  Intra-op Plan:   Post-operative Plan: Extubation in OR  Informed Consent: I have reviewed the patients History and Physical, chart, labs and discussed the procedure including the risks, benefits and alternatives for the proposed anesthesia with the patient or authorized representative who has indicated his/her understanding and acceptance.     Dental advisory given and Consent reviewed with POA  Plan Discussed with:   Anesthesia Plan Comments: (Follows with cardiology for hx  of CAD S/P CABG 2003 with grafts patent by cath 2013, without coronary events since that time. She has hyperdynamic left ventricular systolic function, moderate LVH, chronic diastolic heart failure. Unable to take beta blockers due to bradycardia.   Last seen by Dr. Sallyanne Kuster 02/14/19. Per note "She has clinical signs of hypervolemia but does not have shortness of breath (she is very sedentary).  We will reset her "dry weight" to 135 pounds, but avoid dropping her weight less than 132 pounds due to the risk of electrolyte imbalances.  She is clinically euvolemic and appears to have NYHA functional class II (hard to assess due to sedentary status).  It is possible that she has cardiac amyloidosis, but I think her neurological problems limit the benefit she would get from aggressive work-up and treatment for this disorder.  We will increase the dose of furosemide to 120 mg daily on days when she weighs 135 pounds or greater, otherwise stay on 80 mg once daily."  Per notes she also has significant memory impairment and has difficulty answering questions. Glycemic control has also deteriorated per notes in Epic with daughter reporting frequent readings >300 mg/dl.   TTE 12/21/18:  1. The left ventricle has normal systolic function with an ejection fraction of 60-65%. The cavity size was normal. There is mild concentric left ventricular hypertrophy. Left ventricular diastolic Doppler parameters are consistent with impaired  relaxation. Elevated left ventricular end-diastolic pressure The E/e' is 21. No evidence of left ventricular regional wall motion abnormalities.  2. The right ventricle has normal systolic function. The cavity was normal. There is no increase in right  ventricular wall thickness. Right ventricular systolic pressure is normal with an estimated pressure of 28.1 mmHg.  3. Left atrial size was moderately dilated.  4. Right atrial size was moderately dilated.  5. Mild thickening of the mitral valve  leaflet. Mild calcification of the mitral valve leaflet. There is mild mitral annular calcification present. No evidence of mitral valve stenosis.  6. Moderate thickening of the aortic valve. Moderate calcification of the aortic valve. No stenosis of the aortic valve.  7. The aortic root is normal in size and structure.  8. There is mild dilatation of the ascending aorta measuring 41 mm.  9. The inferior vena cava was dilated in size with >50% respiratory variability.)      Anesthesia Quick Evaluation

## 2019-02-21 ENCOUNTER — Encounter (HOSPITAL_COMMUNITY): Payer: Self-pay | Admitting: Certified Registered Nurse Anesthetist

## 2019-02-22 ENCOUNTER — Other Ambulatory Visit: Payer: Self-pay

## 2019-02-22 ENCOUNTER — Encounter (HOSPITAL_COMMUNITY)
Admission: RE | Disposition: A | Discharge status: Home / Self Care | Payer: Self-pay | Source: Home / Self Care | Attending: Family Medicine

## 2019-02-22 ENCOUNTER — Ambulatory Visit (HOSPITAL_COMMUNITY): Payer: Medicare HMO | Admitting: Physician Assistant

## 2019-02-22 ENCOUNTER — Ambulatory Visit (HOSPITAL_COMMUNITY)
Admission: RE | Admit: 2019-02-22 | Discharge: 2019-02-22 | Disposition: A | Discharge status: Home / Self Care | Payer: Medicare HMO | Attending: Family Medicine | Admitting: Family Medicine

## 2019-02-22 ENCOUNTER — Encounter (HOSPITAL_COMMUNITY): Payer: Self-pay

## 2019-02-22 ENCOUNTER — Ambulatory Visit (HOSPITAL_COMMUNITY)
Admission: RE | Admit: 2019-02-22 | Discharge: 2019-02-22 | Disposition: A | Discharge status: Home / Self Care | Payer: Medicare HMO | Source: Ambulatory Visit | Attending: Gastroenterology | Admitting: Gastroenterology

## 2019-02-22 DIAGNOSIS — Z885 Allergy status to narcotic agent status: Secondary | ICD-10-CM | POA: Insufficient documentation

## 2019-02-22 DIAGNOSIS — I110 Hypertensive heart disease with heart failure: Secondary | ICD-10-CM | POA: Diagnosis not present

## 2019-02-22 DIAGNOSIS — F0390 Unspecified dementia without behavioral disturbance: Secondary | ICD-10-CM | POA: Insufficient documentation

## 2019-02-22 DIAGNOSIS — Z882 Allergy status to sulfonamides status: Secondary | ICD-10-CM | POA: Insufficient documentation

## 2019-02-22 DIAGNOSIS — K861 Other chronic pancreatitis: Secondary | ICD-10-CM | POA: Diagnosis not present

## 2019-02-22 DIAGNOSIS — Z888 Allergy status to other drugs, medicaments and biological substances status: Secondary | ICD-10-CM | POA: Insufficient documentation

## 2019-02-22 DIAGNOSIS — J449 Chronic obstructive pulmonary disease, unspecified: Secondary | ICD-10-CM | POA: Diagnosis not present

## 2019-02-22 DIAGNOSIS — I5030 Unspecified diastolic (congestive) heart failure: Secondary | ICD-10-CM | POA: Insufficient documentation

## 2019-02-22 DIAGNOSIS — Z87891 Personal history of nicotine dependence: Secondary | ICD-10-CM | POA: Insufficient documentation

## 2019-02-22 DIAGNOSIS — F419 Anxiety disorder, unspecified: Secondary | ICD-10-CM | POA: Insufficient documentation

## 2019-02-22 DIAGNOSIS — E278 Other specified disorders of adrenal gland: Secondary | ICD-10-CM | POA: Diagnosis not present

## 2019-02-22 DIAGNOSIS — Z951 Presence of aortocoronary bypass graft: Secondary | ICD-10-CM | POA: Insufficient documentation

## 2019-02-22 DIAGNOSIS — Z880 Allergy status to penicillin: Secondary | ICD-10-CM | POA: Insufficient documentation

## 2019-02-22 DIAGNOSIS — D3502 Benign neoplasm of left adrenal gland: Secondary | ICD-10-CM | POA: Diagnosis not present

## 2019-02-22 DIAGNOSIS — Z886 Allergy status to analgesic agent status: Secondary | ICD-10-CM | POA: Diagnosis not present

## 2019-02-22 DIAGNOSIS — I2510 Atherosclerotic heart disease of native coronary artery without angina pectoris: Secondary | ICD-10-CM | POA: Diagnosis not present

## 2019-02-22 DIAGNOSIS — E119 Type 2 diabetes mellitus without complications: Secondary | ICD-10-CM | POA: Diagnosis not present

## 2019-02-22 DIAGNOSIS — I5032 Chronic diastolic (congestive) heart failure: Secondary | ICD-10-CM | POA: Diagnosis not present

## 2019-02-22 DIAGNOSIS — E279 Disorder of adrenal gland, unspecified: Secondary | ICD-10-CM | POA: Diagnosis not present

## 2019-02-22 HISTORY — DX: Unspecified dementia, unspecified severity, without behavioral disturbance, psychotic disturbance, mood disturbance, and anxiety: F03.90

## 2019-02-22 HISTORY — PX: RADIOLOGY WITH ANESTHESIA: SHX6223

## 2019-02-22 LAB — GLUCOSE, CAPILLARY: Glucose-Capillary: 164 mg/dL — ABNORMAL HIGH (ref 70–99)

## 2019-02-22 SURGERY — MRI WITH ANESTHESIA
Anesthesia: General

## 2019-02-22 MED ORDER — ONDANSETRON HCL 4 MG/2ML IJ SOLN: 4.0000 mg | Freq: Once | INTRAMUSCULAR | Status: DC | PRN

## 2019-02-22 MED ORDER — LIDOCAINE 2% (20 MG/ML) 5 ML SYRINGE
INTRAMUSCULAR | Status: DC | PRN
  Administered 2019-02-22: 80 mg via INTRAVENOUS

## 2019-02-22 MED ORDER — EPHEDRINE SULFATE-NACL 50-0.9 MG/10ML-% IV SOSY
PREFILLED_SYRINGE | INTRAVENOUS | Status: DC | PRN
  Administered 2019-02-22: 20 mg via INTRAVENOUS

## 2019-02-22 MED ORDER — ONDANSETRON HCL 4 MG/2ML IJ SOLN
INTRAMUSCULAR | Status: DC | PRN
  Administered 2019-02-22: 4 mg via INTRAVENOUS

## 2019-02-22 MED ORDER — FENTANYL CITRATE (PF) 100 MCG/2ML IJ SOLN
INTRAMUSCULAR | Status: DC | PRN
  Administered 2019-02-22: 25 ug via INTRAVENOUS

## 2019-02-22 MED ORDER — PROPOFOL 10 MG/ML IV BOLUS
INTRAVENOUS | Status: DC | PRN
  Administered 2019-02-22: 140 mg via INTRAVENOUS

## 2019-02-22 MED ORDER — LACTATED RINGERS IV SOLN
INTRAVENOUS | Status: DC | PRN
  Administered 2019-02-22: 08:00:00 via INTRAVENOUS

## 2019-02-22 NOTE — Progress Notes (Signed)
MRI forms tubed to Radiology.

## 2019-02-22 NOTE — Anesthesia Postprocedure Evaluation (Signed)
Anesthesia Post Note  Patient: Meredith Gutierrez  Procedure(s) Performed: MRI OF ABDOMEN WITH AND WITHOUT CONTRAST (N/A )     Patient location during evaluation: PACU Anesthesia Type: General Level of consciousness: awake and alert Pain management: pain level controlled Vital Signs Assessment: post-procedure vital signs reviewed and stable Respiratory status: spontaneous breathing, nonlabored ventilation and respiratory function stable Cardiovascular status: blood pressure returned to baseline and stable Postop Assessment: no apparent nausea or vomiting Anesthetic complications: no    Last Vitals:  Vitals:   02/22/19 1140 02/22/19 1149  BP: 109/63 (!) 105/51  Pulse: 93 96  Resp: 17 18  Temp:    SpO2: 95% 95%    Last Pain:  Vitals:   02/22/19 1149  TempSrc:   PainSc: 0-No pain                 Lidia Collum

## 2019-02-22 NOTE — Transfer of Care (Signed)
Immediate Anesthesia Transfer of Care Note  Patient: Meredith Gutierrez  Procedure(s) Performed: MRI OF ABDOMEN WITH AND WITHOUT CONTRAST (N/A )  Patient Location: PACU  Anesthesia Type:General  Level of Consciousness: drowsy and patient cooperative  Airway & Oxygen Therapy: Patient Spontanous Breathing and Patient connected to face mask oxygen  Post-op Assessment: Report given to RN, Post -op Vital signs reviewed and stable and Patient moving all extremities  Post vital signs: Reviewed and stable  Last Vitals:  Vitals Value Taken Time  BP 85/51 02/22/19 1108  Temp    Pulse 102 02/22/19 1108  Resp 21 02/22/19 1110  SpO2 87 % 02/22/19 1108  Vitals shown include unvalidated device data.  Last Pain:  Vitals:   02/22/19 0640  TempSrc:   PainSc: 0-No pain         Complications: No apparent anesthesia complications

## 2019-02-22 NOTE — Progress Notes (Signed)
Patient exam started under anesthesia and MRI scanner went down.  Made several attempts to bring scanner back to operational status.  Scanner has been reported to [employer] for repair.  Nurse given opportunity to move patient to another scanner to complete exam.  Chose not to move patient.  Images obtained sent to radiologist.

## 2019-02-22 NOTE — Anesthesia Procedure Notes (Signed)
Procedure Name: LMA Insertion Date/Time: 02/22/2019 8:30 AM Performed by: Lowella Dell, CRNA Pre-anesthesia Checklist: Patient identified, Emergency Drugs available, Suction available and Patient being monitored Patient Re-evaluated:Patient Re-evaluated prior to induction Oxygen Delivery Method: Circle System Utilized Preoxygenation: Pre-oxygenation with 100% oxygen Induction Type: IV induction Ventilation: Mask ventilation without difficulty LMA: LMA inserted LMA Size: 4.0 Number of attempts: 1 Placement Confirmation: positive ETCO2 Tube secured with: Tape Dental Injury: Teeth and Oropharynx as per pre-operative assessment

## 2019-02-23 ENCOUNTER — Encounter (HOSPITAL_COMMUNITY): Payer: Self-pay | Admitting: Radiology

## 2019-02-27 ENCOUNTER — Telehealth: Payer: Self-pay

## 2019-02-27 ENCOUNTER — Telehealth: Payer: Self-pay | Admitting: Gastroenterology

## 2019-02-27 NOTE — Telephone Encounter (Signed)
-----   Message from Yetta Flock, MD sent at 02/22/2019  5:19 PM EDT ----- Meredith Gutierrez can you please relay this result to the patient: - Left adrenal nodule is a benign adenoma.  She can follow-up with her primary care for this lesion to determine if any further work-up is needed, this is a benign lesion. -The lesion noted in her liver on prior CT scan is a benign focal fat infiltration, and does not warrant any further follow-up.  This is not cancerous and is also benign, excellent news. -She has evidence of chronic inflammation of the pancreas called chronic pancreatitis with some suspected cysts in the pancreas.  The radiologist is recommending a follow-up MRI with and without contrast in 6 months.  I would like to see her back in the clinic to discuss this finding further discuss if she warrants any therapy or further work-up for this. She has tiny lesions in her kidney which appear benign, however this will also be followed up with an MRI in 6 months.  Thank you

## 2019-02-27 NOTE — Telephone Encounter (Signed)
Have tried a couple of times since 02/23/19 to reach patient or her daughter [name], leaving messages to please call back;  to give CT results and schedule an office visit with Dr. Havery Moros. Will continue to try.

## 2019-03-05 ENCOUNTER — Encounter: Payer: Self-pay | Admitting: Family Medicine

## 2019-03-05 ENCOUNTER — Other Ambulatory Visit: Payer: Self-pay

## 2019-03-05 ENCOUNTER — Ambulatory Visit (INDEPENDENT_AMBULATORY_CARE_PROVIDER_SITE_OTHER): Payer: Medicare HMO | Admitting: Family Medicine

## 2019-03-05 VITALS — BP 120/68 | HR 76 | Temp 98.0°F | Ht 60.0 in | Wt 135.2 lb

## 2019-03-05 DIAGNOSIS — I5032 Chronic diastolic (congestive) heart failure: Secondary | ICD-10-CM

## 2019-03-05 DIAGNOSIS — E1165 Type 2 diabetes mellitus with hyperglycemia: Secondary | ICD-10-CM

## 2019-03-05 DIAGNOSIS — D3502 Benign neoplasm of left adrenal gland: Secondary | ICD-10-CM

## 2019-03-05 DIAGNOSIS — F0390 Unspecified dementia without behavioral disturbance: Secondary | ICD-10-CM

## 2019-03-05 DIAGNOSIS — R160 Hepatomegaly, not elsewhere classified: Secondary | ICD-10-CM

## 2019-03-05 DIAGNOSIS — K861 Other chronic pancreatitis: Secondary | ICD-10-CM | POA: Diagnosis not present

## 2019-03-05 DIAGNOSIS — Z23 Encounter for immunization: Secondary | ICD-10-CM | POA: Diagnosis not present

## 2019-03-05 DIAGNOSIS — IMO0001 Reserved for inherently not codable concepts without codable children: Secondary | ICD-10-CM

## 2019-03-05 MED ORDER — CANAGLIFLOZIN 100 MG PO TABS: 100.0000 mg | ORAL_TABLET | Freq: Every day | ORAL | 6 refills | Status: DC

## 2019-03-05 MED ORDER — METFORMIN HCL ER 500 MG PO TB24: 1000.0000 mg | ORAL_TABLET | Freq: Every day | ORAL | 1 refills | Status: DC

## 2019-03-05 NOTE — Patient Instructions (Addendum)
Flu shot today Consider diabetes education classes, let us know if interested.  Let's try invokana (canagliflozin) 100mg  daily for sugars.  Keep appointment for physical in November.

## 2019-03-05 NOTE — Progress Notes (Signed)
This visit was conducted in person.  BP 120/68 (BP Location: Left Arm, Patient Position: Sitting, Cuff Size: Normal)   Pulse 76   Temp 98 F (36.7 C) (Temporal)   Ht 5' (1.524 m)   Wt 135 lb 4 oz (61.3 kg)   SpO2 98%   BMI 26.41 kg/m    CC: 2 mo f/u visit Subjective:    Patient ID: Meredith Gutierrez, female    DOB: 12/16/1940, 78 y.o.   MRN: [id number]  HPI: Meredith Gutierrez is a 78 y.o. female presenting on 03/05/2019 for Follow-up (Here for 2 mo f/u.)   See prior note for details.  Recent MRI imaging findings through GI - benign L adrenal adenoma, benign focal fat infiltration of liver, and chronic pancreatitis with syspected pseudocysts of pancreas - planned GI f/u. Radiology recommended rpt imaging 6-12 months.   Saw cardiology 02/2019 ([facility]) - planned new dry weight of 135lbs. HLD - did not tolerate statins, nor benefit from RYR. Not thought good candidate for PCSK9 inhibitor.   DM - does regularly check sugars: 190-230s. Compliant with antihyperglycemic regimen which includes: metformin XR 500/1000mg daily. Denies low sugars or hypoglycemic symptoms. Denies paresthesias. Last diabetic eye exam DUE. Pneumovax: 2020. Prevnar: 2016. Glucometer brand: accu-check aviva plus. DSME: has not completed. She has been drinking glucerna when hungry instead of snacks. Only using top dentures, having trouble using bottom dentures due to lower gum anatomy, don't want to do surgery.  Lab Results  Component Value Date   HGBA1C 10.9 (H) 12/21/2018   Diabetic Foot Exam - Simple   Simple Foot Form Diabetic Foot exam was performed with the following findings: Yes 03/05/2019  4:04 PM  Visual Inspection See comments: Yes Sensation Testing Intact to touch and monofilament testing bilaterally: Yes Pulse Check See comments: Yes Comments Calluses bilateral feet. Diminished pedal pulses bilaterally.    No results found for: Derl Barrow      Relevant past medical, surgical, family  and social history reviewed and updated as indicated. Interim medical history since our last visit reviewed. Allergies and medications reviewed and updated. Outpatient Medications Prior to Visit  Medication Sig Dispense Refill  . ACCU-CHEK FASTCLIX LANCETS MISC 1 each by Does not apply route daily. Use as directed to check blood sugar once daily.  Dx code:  E11.65 100 each 0  . albuterol (PROVENTIL) (2.5 MG/3ML) 0.083% nebulizer solution Take 3 mLs (2.5 mg total) by nebulization every 6 (six) hours as needed for wheezing or shortness of breath. 75 mL 1  . Alcohol Swabs (B-D SINGLE USE SWABS REGULAR) PADS 1 each by Does not apply route daily. Use as directed to check blood sugar once daily.  Dx code:  E11.65 100 each 0  . aspirin EC 81 MG tablet Take 81 mg by mouth daily.    . Blood Glucose Monitoring Suppl (ACCU-CHEK AVIVA PLUS) w/Device KIT 1 each by Does not apply route daily. Use as directed to check blood sugar once daily.  Dx code:  E11.65 1 kit 0  . candesartan (ATACAND) 16 MG tablet TAKE 1 TABLET BY MOUTH DAILY (Patient taking differently: Take 16 mg by mouth daily. ) 30 tablet 6  . carboxymethylcellulose (REFRESH PLUS) 0.5 % SOLN Place 1 drop into both eyes 2 (two) times daily as needed (for irritation).     . Cholecalciferol (VITAMIN D) 2000 units CAPS Take 1 capsule (2,000 Units total) by mouth daily. (Patient taking differently: Take 2,000 Units by mouth daily. ) 30  capsule   . donepezil (ARICEPT) 5 MG tablet TAKE ONE TABLET BY MOUTH AT BEDTIME 30 tablet 3  . furosemide (LASIX) 40 MG tablet Take 120 mg (3 tablets) once daily for a weight of 135 pounds and over and take 80 mg (2 tablets) once daily for a weight of 134 and below. (Patient taking differently: Take 80-120 mg by mouth See admin instructions. Take 120 mg (3 tablets) once daily for a weight of 135 pounds and over and take 80 mg (2 tablets) once daily for a weight of 134 and below.) 90 tablet 5  . glucose blood (ACCU-CHEK AVIVA  PLUS) test strip 1 each by Other route as needed for other. Use as instructed to check blood sugar once daily.  Dx code:  E11.65 100 each 0  . LORazepam (ATIVAN) 0.5 MG tablet TAKE 1 TABLET BY MOUTH AT BEDTIME AS NEEDED FOR ANXIETY OR SLEEP MAY TAKE SECOND 1/2 TABLET AS NEEDED (Patient taking differently: Take 0.25-0.5 mg by mouth See admin instructions. Take 0.23m by mouth at bedtime as needed for anxiety or sleep, may take an additional 0.272mas needed.) 45 tablet 0  . morphine (MS CONTIN) 15 MG 12 hr tablet Take 15 mg by mouth See admin instructions. Take 15 mg by mouth two times a day- 10 AM and 6 PM    . morphine (MSIR) 15 MG tablet Take 15 mg by mouth See admin instructions. Take 15 mg by mouth four times a day- 6:30 AM, 7:30 AM, 1:00 PM, 3:00 PM    . polyethylene glycol (MIRALAX / GLYCOLAX) 17 g packet Take 17 g by mouth daily as needed. (Patient taking differently: Take 17 g by mouth daily as needed for mild constipation. ) 14 each 0  . potassium chloride SA (K-DUR) 20 MEQ tablet Take 60 mEq (3 tablets) when you take the 120 mg of the Furosemide and take 40 mEq (2 tablets) when you take the 80 mg of the Furosemide (Patient taking differently: Take 40-60 mEq by mouth See admin instructions. Take 60 mEq (3 tablets) when you take the 120 mg of the Furosemide and take 40 mEq (2 tablets) when you take the 80 mg of the Furosemide) 90 tablet 5  . senna-docusate (SENOKOT-S) 8.6-50 MG tablet Take 1 tablet by mouth at bedtime.    . sodium chloride (OCEAN) 0.65 % SOLN nasal spray Place 1 spray into both nostrils as needed for congestion.    . metFORMIN (GLUCOPHAGE-XR) 500 MG 24 hr tablet Take 1 tablet with breakfast and 2 tablets with dinner (Patient taking differently: Take 500-1,000 mg by mouth See admin instructions. Take 1 tablet with breakfast and 2 tablets with dinner) 270 tablet 1   No facility-administered medications prior to visit.      Per HPI unless specifically indicated in ROS section below  Review of Systems Objective:    BP 120/68 (BP Location: Left Arm, Patient Position: Sitting, Cuff Size: Normal)   Pulse 76   Temp 98 F (36.7 C) (Temporal)   Ht 5' (1.524 m)   Wt 135 lb 4 oz (61.3 kg)   SpO2 98%   BMI 26.41 kg/m   Wt Readings from Last 3 Encounters:  03/05/19 135 lb 4 oz (61.3 kg)  02/22/19 138 lb (62.6 kg)  02/14/19 138 lb 3.2 oz (62.7 kg)    Physical Exam Vitals signs and nursing note reviewed.  Constitutional:      General: She is not in acute distress.    Appearance: Normal  appearance. She is well-developed. She is not Gutierrez-appearing.  HENT:     Head: Normocephalic and atraumatic.     Mouth/Throat:     Mouth: Mucous membranes are moist.     Pharynx: Oropharynx is clear. No oropharyngeal exudate.  Eyes:     General: No scleral icterus.    Extraocular Movements: Extraocular movements intact.     Conjunctiva/sclera: Conjunctivae normal.     Pupils: Pupils are equal, round, and reactive to light.  Neck:     Musculoskeletal: Normal range of motion and neck supple.  Cardiovascular:     Rate and Rhythm: Normal rate and regular rhythm.     Pulses: Normal pulses.     Heart sounds: Normal heart sounds. No murmur.  Pulmonary:     Effort: Pulmonary effort is normal. No respiratory distress.     Breath sounds: Normal breath sounds. No wheezing, rhonchi or rales.  Musculoskeletal:     Right lower leg: No edema.     Left lower leg: No edema.     Comments: See HPI for foot exam if done  Lymphadenopathy:     Cervical: No cervical adenopathy.  Skin:    General: Skin is warm and dry.     Findings: No rash.  Neurological:     Mental Status: She is alert.  Psychiatric:        Mood and Affect: Mood normal.        Behavior: Behavior normal.       Results for orders placed or performed during the hospital encounter of 02/22/19  Glucose, capillary  Result Value Ref Range   Glucose-Capillary 164 (H) 70 - 99 mg/dL   Lab Results  Component Value Date    CREATININE 0.78 02/05/2019   BUN 25 (H) 02/05/2019   NA 136 02/05/2019   K 4.5 02/05/2019   CL 97 02/05/2019   CO2 29 02/05/2019    Lab Results  Component Value Date   HGBA1C 10.9 (H) 12/21/2018   Assessment & Plan:   Problem List Items Addressed This Visit    Uncontrolled diabetes mellitus type 2 without complications ([place])    Chronic, uncontrolled based on latest A1c. In setting of chronic pancreatitis, anticipate component of insulin deficiency. Daughter does not want to start insulin at this time. Will start SGLT2 (canagliflozin is only one on pt formulary). Discussed mechanism of action as well as monitoring for recurrent UTI, yeast infection, groin infection. Discussed diabetes classes - daughter will consider and let me know if interested in referral. Closely monitor labs in 6 wks at CPE.       Relevant Medications   canagliflozin (INVOKANA) 100 MG TABS tablet   metFORMIN (GLUCOPHAGE-XR) 500 MG 24 hr tablet   Liver mass    Benign by MRCP.       Dementia ([place]) (Chronic)    Stable period on low dose aricept without increased level of care need noted. Daughter is caregiver.       Chronic pancreatitis ([place])    Reassuring MRCP. Has GI f/u planned.       Chronic diastolic (congestive) heart failure ([facility]) (Chronic)    New dry weight = 135 lbs. May benefit from SGLT2-I for diuretic effect - see below.       Adrenal adenoma, left    Reassuring MRI.        Other Visit Diagnoses    Need for influenza vaccination    -  Primary   Relevant Orders   Flu Vaccine QUAD  High Dose(Fluad) (Completed)       Meds ordered this encounter  Medications  . canagliflozin (INVOKANA) 100 MG TABS tablet    Sig: Take 1 tablet (100 mg total) by mouth daily before breakfast.    Dispense:  30 tablet    Refill:  6  . metFORMIN (GLUCOPHAGE-XR) 500 MG 24 hr tablet    Sig: Take 2 tablets (1,000 mg total) by mouth daily with breakfast.    Dispense:  180 tablet    Refill:  1    Note new sig    Orders Placed This Encounter  Procedures  . Flu Vaccine QUAD High Dose(Fluad)    Patient Instructions  Flu shot today Consider diabetes education classes, let us know if interested.  Let's try invokana (canagliflozin) 147m daily for sugars.  Keep appointment for physical in November.   Follow up plan: Return in about 6 weeks (around 04/16/2019) for annual exam, prior fasting for blood work.  JRia Bush MD

## 2019-03-05 NOTE — Assessment & Plan Note (Addendum)
Chronic, uncontrolled based on latest A1c. In setting of chronic pancreatitis, anticipate component of insulin deficiency. Daughter does not want to start insulin at this time. Will start SGLT2 (canagliflozin is only one on pt formulary). Discussed mechanism of action as well as monitoring for recurrent UTI, yeast infection, groin infection. Discussed diabetes classes - daughter will consider and let me know if interested in referral. Closely monitor labs in 6 wks at CPE.

## 2019-03-06 NOTE — Assessment & Plan Note (Addendum)
New dry weight = 135 lbs. May benefit from SGLT2-I for diuretic effect - see below.

## 2019-03-06 NOTE — Assessment & Plan Note (Signed)
Reassuring MRCP. Has GI f/u planned.

## 2019-03-06 NOTE — Assessment & Plan Note (Signed)
Benign by MRCP.

## 2019-03-06 NOTE — Assessment & Plan Note (Signed)
Reassuring MRI.

## 2019-03-06 NOTE — Assessment & Plan Note (Addendum)
Stable period on low dose aricept without increased level of care need noted. Daughter is caregiver.

## 2019-03-13 ENCOUNTER — Other Ambulatory Visit: Payer: Self-pay | Admitting: Family Medicine

## 2019-03-13 NOTE — Telephone Encounter (Signed)
Name of Medication: Lorazepam Name of Pharmacy: [facility] or Written Date and Quantity: 02/12/19, #45 Last Office Visit and Type: 03/05/19, f/u Next Office Visit and Type: 04/30/19 Last Controlled Substance Agreement Date: none Last UDS: none

## 2019-03-14 NOTE — Telephone Encounter (Signed)
ERx 

## 2019-03-22 NOTE — Telephone Encounter (Signed)
See new phone nurse that was opened.

## 2019-03-26 DIAGNOSIS — M47816 Spondylosis without myelopathy or radiculopathy, lumbar region: Secondary | ICD-10-CM | POA: Diagnosis not present

## 2019-03-28 ENCOUNTER — Telehealth: Payer: Self-pay | Admitting: Cardiovascular Disease

## 2019-03-28 DIAGNOSIS — I5032 Chronic diastolic (congestive) heart failure: Secondary | ICD-10-CM

## 2019-03-28 DIAGNOSIS — Z79899 Other long term (current) drug therapy: Secondary | ICD-10-CM

## 2019-03-28 NOTE — Telephone Encounter (Signed)
Returned call to daughter and she states that Dr C wanted her to call when pt reached 132. Also, she seems to be sleeping a little more than usual.

## 2019-03-28 NOTE — Telephone Encounter (Signed)
Daughter notified of Dr Lurline Del message and she will bring pt in for labwork on Monday.

## 2019-03-28 NOTE — Telephone Encounter (Signed)
The lower weight may mean that she is getting a little "dry".  That may also explain why she is more sleepy. I noticed that she started treatment with Invokana for her blood sugar.  This medication has a diuretic effect and she likely needs less furosemide now. Please skip 1 day of furosemide completely, tomorrow. Starting Friday start taking furosemide only 40 mg once daily and take potassium chloride 40 mEq daily.  Would like to try to keep her weight between 133 and 136 pounds, which by previous estimation is her optimal fluid status.   Recommend a BMET.

## 2019-03-28 NOTE — Telephone Encounter (Signed)
Follow up:     Patient daughter returning a call back. Please call patient.

## 2019-03-28 NOTE — Telephone Encounter (Signed)
New message   Patient's daughter is calling to advise her mother's weight is now 132. Please advise.

## 2019-04-02 DIAGNOSIS — Z79899 Other long term (current) drug therapy: Secondary | ICD-10-CM | POA: Diagnosis not present

## 2019-04-02 DIAGNOSIS — I5032 Chronic diastolic (congestive) heart failure: Secondary | ICD-10-CM | POA: Diagnosis not present

## 2019-04-03 ENCOUNTER — Telehealth: Payer: Self-pay | Admitting: *Deleted

## 2019-04-03 DIAGNOSIS — Z79899 Other long term (current) drug therapy: Secondary | ICD-10-CM

## 2019-04-03 DIAGNOSIS — I5032 Chronic diastolic (congestive) heart failure: Secondary | ICD-10-CM

## 2019-04-03 LAB — BASIC METABOLIC PANEL
BUN/Creatinine Ratio: 19 (ref 12–28)
BUN: 15 mg/dL (ref 8–27)
CO2: 24 mmol/L (ref 20–29)
Calcium: 9.9 mg/dL (ref 8.7–10.3)
Chloride: 98 mmol/L (ref 96–106)
Creatinine, Ser: 0.8 mg/dL (ref 0.57–1.00)
GFR calc Af Amer: 81 mL/min/{1.73_m2} (ref >59)
GFR calc non Af Amer: 70 mL/min/{1.73_m2} (ref >59)
Glucose: 110 mg/dL — ABNORMAL HIGH (ref 65–99)
Potassium: 6 mmol/L (ref 3.5–5.2)
Sodium: 136 mmol/L (ref 134–144)

## 2019-04-03 NOTE — Telephone Encounter (Signed)
Patient had already been made aware of abnormal lab and is coming in for repeat BMET today.

## 2019-04-03 NOTE — Telephone Encounter (Signed)
-----   Message from Sanda Klein, MD sent at 04/03/2019  8:07 AM EDT ----- If the labs are correct, the potassium level is dangerously high.  Please stop giving her the potassium supplement.  Hold the Atacand (candesartan).  The furosemide should be continued. We need to repeat the labs today.  Please send as a stat. However, it does not make a whole lot of sense for her potassium to be high (after we gave her metolazone and furosemide combined and with normal kidney function).  It is possible that the abnormal value is artifactual, due to hemolysis.

## 2019-04-03 NOTE — Telephone Encounter (Signed)
Patient's daughter made aware of results and verbalized understanding. She will bring her mother in today for a repeat BMET.

## 2019-04-03 NOTE — Telephone Encounter (Signed)
[facility] called to report potassium of 6.0 on labs from today, she is faxing the report.

## 2019-04-04 ENCOUNTER — Telehealth: Payer: Self-pay | Admitting: *Deleted

## 2019-04-04 DIAGNOSIS — E875 Hyperkalemia: Secondary | ICD-10-CM

## 2019-04-04 LAB — BASIC METABOLIC PANEL
BUN/Creatinine Ratio: 28 (ref 12–28)
BUN: 24 mg/dL (ref 8–27)
CO2: 24 mmol/L (ref 20–29)
Calcium: 9.7 mg/dL (ref 8.7–10.3)
Chloride: 100 mmol/L (ref 96–106)
Creatinine, Ser: 0.87 mg/dL (ref 0.57–1.00)
GFR calc Af Amer: 73 mL/min/{1.73_m2} (ref >59)
GFR calc non Af Amer: 63 mL/min/{1.73_m2} (ref >59)
Glucose: 159 mg/dL — ABNORMAL HIGH (ref 65–99)
Potassium: 5.5 mmol/L — ABNORMAL HIGH (ref 3.5–5.2)
Sodium: 135 mmol/L (ref 134–144)

## 2019-04-04 NOTE — Telephone Encounter (Signed)
Spoke with pt daughter, aware of results and instructions. Lab orders mailed to the pt

## 2019-04-04 NOTE — Telephone Encounter (Signed)
-----   Message from Sanda Klein, MD sent at 04/04/2019 11:34 AM EDT ----- K is still high, but improving. Stay off K supplements and off candesartan. Repeat BMET in one week please.

## 2019-04-05 ENCOUNTER — Ambulatory Visit (INDEPENDENT_AMBULATORY_CARE_PROVIDER_SITE_OTHER): Payer: Medicare HMO | Admitting: Gastroenterology

## 2019-04-05 ENCOUNTER — Other Ambulatory Visit: Payer: Self-pay

## 2019-04-05 ENCOUNTER — Encounter: Payer: Self-pay | Admitting: Gastroenterology

## 2019-04-05 VITALS — BP 102/74 | HR 90 | Temp 97.8°F | Ht 60.0 in | Wt 134.5 lb

## 2019-04-05 DIAGNOSIS — K861 Other chronic pancreatitis: Secondary | ICD-10-CM | POA: Diagnosis not present

## 2019-04-05 DIAGNOSIS — R933 Abnormal findings on diagnostic imaging of other parts of digestive tract: Secondary | ICD-10-CM | POA: Diagnosis not present

## 2019-04-05 DIAGNOSIS — R1310 Dysphagia, unspecified: Secondary | ICD-10-CM

## 2019-04-05 NOTE — Patient Instructions (Signed)
If you are age 80 or older, your body mass index should be between 23-30. Your Body mass index is 26.27 kg/m. If this is out of the aforementioned range listed, please consider follow up with your Primary Care Provider.  If you are age 78 or younger, your body mass index should be between 19-25. Your Body mass index is 26.27 kg/m. If this is out of the aformentioned range listed, please consider follow up with your Primary Care Provider.   To help prevent the possible spread of infection to our patients, communities, and staff; we will be implementing the following measures:  As of now we are not allowing any visitors/family members to accompany you to any upcoming appointments with [facility]. If you have any concerns about this please contact our office to discuss prior to the appointment.   You will be due for a MRCP of the pancreas in March of 2021.  We will let you know when it is time to schedule this procedure.  Thank you for entrusting me with your care and for choosing [facility], Dr. [place] Cellar

## 2019-04-05 NOTE — Progress Notes (Signed)
HPI :  78 year old female here for a follow-up visit, accompanied by her daughter who provides most of the patient's history.  Patient has a history of dementia, chronic tobacco use, COPD, chronic diastolic heart failure, here for follow-up for abnormal MRI the pancreas.   She was hospitalized in July for sepsis related to pneumonia.  She underwent imaging which showed a questionable liver mass and abnormal pancreatic findings.  She saw [place] as an outpatient in clinic after that hospitalization a MRCP/MRI of the abdomen was performed in September, although she required anesthesia to sedate her for this as she had a hard time lying still for it.  Notable findings of the MRI are as outlined: 02/22/19 MRI / MRCP 02/22/19 - IMPRESSION: 1. The MRI examination was performed without IV contrast due to technical failure of the MRI scanner during the scan. 2. The left adrenal 2.5 cm nodule is definitively characterized as a benign adenoma by noncontrast MRI. 3. Mild diffuse hepatic steatosis. The subcapsular lesion described in the segment 4A left liver lobe on the 12/20/2018 CT study is definitively characterized as benign focal subcapsular fat by noncontrast MRI. 4. Evidence of chronic pancreatitis with diffuse irregular beaded dilatation of the pancreatic duct. Scattered small T2 hyperintense pancreatic lesions, largest 1.1 cm in the pancreatic neck, without aggressive features by noncontrast MRI, although incompletely characterized without IV contrast, most likely small pancreatic pseudocysts. Follow-up MRI abdomen without and with IV contrast recommended in 6-12 months. 5. Tiny hemorrhagic/proteinaceous 0.4 cm renal cortical lesion in the lower left kidney. Additional subcentimeter T2 hyperintense liver, splenic and renal cortical lesions. These lesions are all incompletely characterized on this noncontrast study, however are statistically likely benign and can also be reassessed on  follow-up MRI in 6-12 months. 6. No biliary ductal dilatation. CBD diameter 6 mm, top-normal. No evidence of cholelithiasis or choledocholithiasis.  The patient denies any abdominal pains that bother her.  She eats 3 meals a day.  Generally she states she is feeling pretty good.  She has chronic diastolic heart failure with a normal ejection fraction.  She is been followed by cardiology and an aggressive diuretic regimen, she is lost 20 pounds in water weight over the past 8 months.  Weight has been stable since then.  She denies any problems with her bowels, no steatorrhea or oils in the stool.  No blood in the stools.  She takes morphine for chronic back pain which occasionally lead to constipation for which she uses MiraLAX as needed.  They have several questions about MRCP findings.  The patient denies any alcohol use in the past that has been significant.  She has a chronic tobacco use history of several years, she stopped smoking about 2 years ago.  Her aunt had pancreatic cancer and her sister has had pancreatitis, no other family history known of pancreatic issues.  She has never had a personal history of pancreatitis in the past that she is aware of.  Daughter also endorses that she has had recurrent dysphagia.  She feels solids and liquids get caught up in the sternal notch or upper esophagus and causes symptoms fairly frequently with her meals.  She eats soft foods which tends to help this.  She denies any reflux symptoms that bother her otherwise.  She has had a barium swallow in April 2019 which showed esophageal dysmotility without any obvious stenosis or stricture.  I performed an EGD in November 2019. She was noted to have mild esophagitis, a small nodule in  the distal esophagus, benign schwan cell hemartoma, she was empirically dilated to 18 mm. I then placed her on Pepcid for esophagitis.  Daughter states the EGD completely resolved her symptoms for several months and she had no issues.   She has had some recurrence of dysphagia in recent months.  Wanted to discuss options.  Otherwise the patient has never had a colonoscopy and never wants one.  She has declined optical colonoscopy.  She had a negative Cologuard November 2018.     Past Medical History:  Diagnosis Date   Angina    never needed to take nitroglycerin.   Anxiety    Blood transfusion without reported diagnosis    Cerebral atherosclerosis    COPD (chronic obstructive pulmonary disease) ([place])    02-16-13-"pt. denies this"-shows on CXR of 2- 2013.   Coronary artery disease 2003   s/p CABG 2014   Dementia [facility])    Diabetes mellitus without complication ([place]) 99/3716   NEW ONSET   Diastolic CHF, on admit treated. 08/10/2011   Family history of adverse reaction to anesthesia    Dad and Sister had trouble waking up   Fibromuscular dysplasia of renal artery ([facility])    right   Heart murmur    History of kidney stones 02-16-13   "hx. of overgrowth of muscle-causing some stricture"renal artery stenosis-being followed [place]   Hyperlipidemia    Hypertension    Hyperthyroidism    [place]   Hyponatremia 04/2018   Melanoma ([place]) 2010   skin cancers, 1 melanoma removed back   Osteoarthritis      Past Surgical History:  Procedure Laterality Date   ABDOMINAL HYSTERECTOMY  1970   heavy bleeding   APPENDECTOMY  1960s   CARDIAC CATHETERIZATION  08/09/2011   no sign. coronary obstruction. LIMA is atretic w/excellent flow down the native LAD   CARDIAC CATHETERIZATION  08/22/2001   no sign. coronary stenosis,   CATARACT EXTRACTION, BILATERAL Bilateral    CESAREAN SECTION     CORONARY ARTERY BYPASS GRAFT  02-16-13   3'03-no bypasses -Had 2 aneurysm repairs(stents used)   ESOPHAGOGASTRODUODENOSCOPY  04/2018   LA Grade A esophagitis, small nodule distal esophagus (benign schwann cell hamartoma), empiric dilation to 30m (Ajani Schnieders)   LAPAROTOMY N/A 02/20/2013   Procedure: EXPLORATORY LAPAROTOMY;   WJanie Morning MD   LEFT HEART CATHETERIZATION WITH CORONARY ANGIOGRAM N/A 08/09/2011   Procedure: LEFT HEART CATHETERIZATION WITH CORONARY ANGIOGRAM;  Surgeon: MSanda Klein MD;  [facility];  Service: Cardiovascular;  Laterality: N/A;   NM MYOCAR PERF WALL MOTION  07/20/2010   normal   RADIOLOGY WITH ANESTHESIA N/A 02/22/2019   Procedure: MRI OF ABDOMEN WITH AND WITHOUT CONTRAST;  Surgeon: Radiologist, Medication, MD;  [facility]  Service: Radiology;  Laterality: N/A;   SALPINGOOPHORECTOMY Bilateral 02/20/2013   benign seromucinous cystadenofibroma R ovary ([name] MD)   TOOTH EXTRACTION     TOTAL HIP ARTHROPLASTY Left 2015   Family History  Problem Relation Age of Onset   Stroke Mother    Hypotension Mother    Dementia Mother 648  Coronary artery disease Father    Hypertension Father    Breast cancer Maternal Aunt    Hypotension Sister    Hypertension Brother    Diabetes Brother    Lung cancer Daughter    Thyroid cancer Daughter    Colon polyps Maternal Grandfather    Colon cancer Neg Hx    Esophageal cancer Neg Hx    Stomach cancer Neg  Hx    Rectal cancer Neg Hx    Social History   Tobacco Use   Smoking status: Former Smoker    Packs/day: 0.10    Years: 20.00    Pack years: 2.00    Types: Cigarettes    Quit date: 09/14/2017    Years since quitting: 1.5   Smokeless tobacco: Never Used   Tobacco comment: smoking cessation info given  Substance Use Topics   Alcohol use: No   Drug use: No   Current Outpatient Medications  Medication Sig Dispense Refill   ACCU-CHEK FASTCLIX LANCETS MISC 1 each by Does not apply route daily. Use as directed to check blood sugar once daily.  Dx code:  E11.65 100 each 0   albuterol (PROVENTIL) (2.5 MG/3ML) 0.083% nebulizer solution Take 3 mLs (2.5 mg total) by nebulization every 6 (six) hours as needed for wheezing or shortness of breath. 75 mL 1   Alcohol Swabs (B-D SINGLE USE SWABS  REGULAR) PADS 1 each by Does not apply route daily. Use as directed to check blood sugar once daily.  Dx code:  E11.65 100 each 0   aspirin EC 81 MG tablet Take 81 mg by mouth daily.     Blood Glucose Monitoring Suppl (ACCU-CHEK AVIVA PLUS) w/Device KIT 1 each by Does not apply route daily. Use as directed to check blood sugar once daily.  Dx code:  E11.65 1 kit 0   canagliflozin (INVOKANA) 100 MG TABS tablet Take 1 tablet (100 mg total) by mouth daily before breakfast. 30 tablet 6   candesartan (ATACAND) 16 MG tablet TAKE 1 TABLET BY MOUTH DAILY (Patient taking differently: Take 16 mg by mouth daily. ) 30 tablet 6   carboxymethylcellulose (REFRESH PLUS) 0.5 % SOLN Place 1 drop into both eyes 2 (two) times daily as needed (for irritation).      Cholecalciferol (VITAMIN D) 2000 units CAPS Take 1 capsule (2,000 Units total) by mouth daily. (Patient taking differently: Take 2,000 Units by mouth daily. ) 30 capsule    donepezil (ARICEPT) 5 MG tablet TAKE ONE TABLET BY MOUTH AT BEDTIME 30 tablet 3   furosemide (LASIX) 40 MG tablet Take 120 mg (3 tablets) once daily for a weight of 135 pounds and over and take 80 mg (2 tablets) once daily for a weight of 134 and below. (Patient taking differently: Take 80-120 mg by mouth See admin instructions. Take 120 mg (3 tablets) once daily for a weight of 135 pounds and over and take 80 mg (2 tablets) once daily for a weight of 134 and below.) 90 tablet 5   glucose blood (ACCU-CHEK AVIVA PLUS) test strip 1 each by Other route as needed for other. Use as instructed to check blood sugar once daily.  Dx code:  E11.65 100 each 0   LORazepam (ATIVAN) 0.5 MG tablet TAKE 1 TABLET BY MOUTH AT BEDTIME AS NEEDED FOR ANXIETY OR SLEEP MAY TAKE SECOND 1/2 TABLET AS NEEDED 45 tablet 0   metFORMIN (GLUCOPHAGE-XR) 500 MG 24 hr tablet Take 2 tablets (1,000 mg total) by mouth daily with breakfast. 180 tablet 1   morphine (MS CONTIN) 15 MG 12 hr tablet Take 15 mg by mouth See  admin instructions. Take 15 mg by mouth two times a day- 10 AM and 6 PM     morphine (MSIR) 15 MG tablet Take 15 mg by mouth See admin instructions. Take 15 mg by mouth four times a day- 6:30 AM, 7:30 AM, 1:00 PM, 3:00  PM     polyethylene glycol (MIRALAX / GLYCOLAX) 17 g packet Take 17 g by mouth daily as needed. (Patient taking differently: Take 17 g by mouth daily as needed for mild constipation. ) 14 each 0   potassium chloride SA (K-DUR) 20 MEQ tablet Take 60 mEq (3 tablets) when you take the 120 mg of the Furosemide and take 40 mEq (2 tablets) when you take the 80 mg of the Furosemide (Patient taking differently: Take 40-60 mEq by mouth See admin instructions. Take 60 mEq (3 tablets) when you take the 120 mg of the Furosemide and take 40 mEq (2 tablets) when you take the 80 mg of the Furosemide) 90 tablet 5   senna-docusate (SENOKOT-S) 8.6-50 MG tablet Take 1 tablet by mouth at bedtime.     sodium chloride (OCEAN) 0.65 % SOLN nasal spray Place 1 spray into both nostrils as needed for congestion.     No current facility-administered medications for this visit.    Allergies  Allergen Reactions   Codeine Anaphylaxis and Shortness Of Breath   Penicillins Anaphylaxis and Shortness Of Breath    Has patient had a PCN reaction causing immediate rash, facial/tongue/throat swelling, SOB or lightheadedness with hypotension: YES Has patient had a PCN reaction causing severe rash involving mucus membranes or skin necrosis: NO Has patient had a PCN reaction that required hospitalization: NO Has patient had a PCN reaction occurring within the last 10 years: NO If all of the above answers are "NO", then may proceed with Cephalosporin use.   Prednisolone Acetate Shortness Of Breath and Nausea Only   Prednisone Hives, Shortness Of Breath and Swelling   Meloxicam Hives   Nitroglycerin Other (See Comments)    Reaction not recalled    Metolazone Other (See Comments)    Hypokalemia, hyponatremia    Adhesive [Tape] Rash and Other (See Comments)    SKIN IS VERY SENSITIVE- TEARS AND BRUISES EASILY, also   Crestor [Rosuvastatin Calcium] Other (See Comments)    Weakness with statins   Lopressor [Metoprolol Tartrate] Other (See Comments)    Bradycardia    Sulfamethoxazole Hives and Swelling   Zetia [Ezetimibe] Other (See Comments)    Weakness      Review of Systems: All systems reviewed and negative except where noted in HPI.   Lab Results  Component Value Date   WBC 11.0 (H) 01/01/2019   HGB 12.8 01/01/2019   HCT 38.5 01/01/2019   MCV 88.7 01/01/2019   PLT 405.0 (H) 01/01/2019    Lab Results  Component Value Date   CREATININE 0.87 04/03/2019   BUN 24 04/03/2019   NA 135 04/03/2019   K 5.5 (H) 04/03/2019   CL 100 04/03/2019   CO2 24 04/03/2019    Lab Results  Component Value Date   ALT 16 12/20/2018   AST 19 12/20/2018   ALKPHOS 91 12/20/2018   BILITOT 0.7 12/20/2018     Physical Exam: BP 102/74 (BP Location: Left Arm, Patient Position: Sitting, Cuff Size: Normal) Comment (Cuff Size): forearm   Pulse 90    Temp 97.8 F (36.6 C) (Oral)    Ht 5' (1.524 m)    Wt 134 lb 8 oz (61 kg)    BMI 26.27 kg/m  Constitutional: Pleasant,well-developed, female in no acute distress. HEENT: Normocephalic and atraumatic. Conjunctivae are normal. No scleral icterus. Neck supple.  Cardiovascular: Normal rate, regular rhythm.  Pulmonary/chest: Effort normal and breath sounds normal. No wheezing, rales or rhonchi. Abdominal: Soft, nondistended, nontender. There  are no masses palpable.  Extremities: no edema Lymphadenopathy: No cervical adenopathy noted. Neurological: Alert and oriented to person place and time. Skin: Skin is warm and dry. No rashes noted. Psychiatric: Normal mood and affect. Behavior is normal.   ASSESSMENT AND PLAN: 78 year old female here for reassessment the following issues:  Chronic pancreatitis / abnormal pancreatic imaging - incidentally noted  on CT scan which led to MRCP.  Findings are most suggestive of chronic calcific pancreatitis with possible pseudocyst or IPMN.  Pancreatic duct is dilated up to max of 5 mm.  Discussed the findings with the patient and daughter.  She is asymptomatic from this at present time, no abdominal pain, no problems with her bowels.  She has chronic tobacco use which increases her risk for this.  Her triglyceride levels have been mildly elevated over time, would be unusual to cause this these findings in itself but can discuss management of that with PCP.  I discussed there is risk for progression and causing symptoms.  I discussed there is risk of precancerous changes and may increase her risk for pancreatic cancer, however there is no evidence of obvious malignancy on most recent imaging.  I agree with surveillance with MRCP/MRI 6 months from this last exam, this will need to be done with anesthesia support to sedate her and keep her still for it.  Will use contrast for the next exam (it was intended for the last exam however there was a technical error and not administered).  If there are any concerning changes or progression over time, will then need an EUS.  We discussed what that would entail, they want to hold off on that unless really needed.  They will keep an eye on things moving forward, if any symptoms in the interim they will contact me. Of note, benign appearing renal lesion will also be re-evaluated with MRI in 6 months.  Dysphagia - suspect due to dysmotility versus subtle stenosis dilated during last EGD.  She had a significant benefit from an EGD about a year ago, symptoms have slowly recurred.  I offered her another EGD to further evaluate and dilate given it provided significant benefit for a period of time. I discussed risks / benefits with them.  Daughter wants to proceed, patient is hesitant to proceed and declined.  Daughter wants to talk with the patient more about at home and contact me for  scheduling if they wish to proceed moving forward.  Adrenal adenoma - can follow up with PCP to determine if further hormonal testing is needed, reassured them this appears benign.  Mazomanie Cellar, MD [facility]

## 2019-04-09 ENCOUNTER — Telehealth: Payer: Self-pay | Admitting: Cardiovascular Disease

## 2019-04-09 NOTE — Telephone Encounter (Signed)
Left a message for the patient to call back.  

## 2019-04-09 NOTE — Telephone Encounter (Signed)
New message   Patient's daughter states that the patient has lost some weight. Please call to daughter to discuss.

## 2019-04-10 NOTE — Telephone Encounter (Signed)
Spoke to patient's daughter [name].She wanted Dr.Croitoru to know mother's weight was 131 lbs this past Sunday.Yesterday she weighed 136 lbs.She does not know her weight today.Stated she is doing good.No sob.B/P normal.She was just alittle concerned her weight was 131 lbs on Sunday.Advised I will send message to Dr.Croitoru.

## 2019-04-11 NOTE — Telephone Encounter (Signed)
When she weighs under 132 lb, she can skip the diuretic. We are trying to tailor the diuretic dose to keep her 133-136 , not too wet and not too dry. I think her unpredictable intake of food and water makes it difficult to manage her with a fixed diuretic dose every day.

## 2019-04-12 NOTE — Telephone Encounter (Signed)
Returned call to patient's daughter Carol,LMTC.

## 2019-04-13 ENCOUNTER — Telehealth: Payer: Self-pay | Admitting: *Deleted

## 2019-04-13 ENCOUNTER — Other Ambulatory Visit: Payer: Self-pay | Admitting: Family Medicine

## 2019-04-13 DIAGNOSIS — I10 Essential (primary) hypertension: Secondary | ICD-10-CM

## 2019-04-13 DIAGNOSIS — E875 Hyperkalemia: Secondary | ICD-10-CM

## 2019-04-13 DIAGNOSIS — I5032 Chronic diastolic (congestive) heart failure: Secondary | ICD-10-CM

## 2019-04-13 LAB — BASIC METABOLIC PANEL
BUN/Creatinine Ratio: 25 (ref 12–28)
BUN: 21 mg/dL (ref 8–27)
CO2: 22 mmol/L (ref 20–29)
Calcium: 9.5 mg/dL (ref 8.7–10.3)
Chloride: 103 mmol/L (ref 96–106)
Creatinine, Ser: 0.85 mg/dL (ref 0.57–1.00)
GFR calc Af Amer: 75 mL/min/{1.73_m2} (ref >59)
GFR calc non Af Amer: 65 mL/min/{1.73_m2} (ref >59)
Glucose: 103 mg/dL — ABNORMAL HIGH (ref 65–99)
Potassium: 5.5 mmol/L — ABNORMAL HIGH (ref 3.5–5.2)
Sodium: 139 mmol/L (ref 134–144)

## 2019-04-13 NOTE — Telephone Encounter (Signed)
The patient's daughter has been made aware. The patient has not been taking her potassium supplement.  The K supplement is still on her list, please make sure she is not taking it. When we repeat labs in 2 weeks, please also send a urine potassium, urine creatinine and urine sodium.  May need referral to Nephrology

## 2019-04-13 NOTE — Telephone Encounter (Signed)
Patient's daughter made aware of results and verbalized understanding.  She stated that the patient had been off of the candesartan for 1 week. Per Dr. Sallyanne Kuster, the patient should discontinue the candesartan and come back in 1-2 weeks for a repeat BMET.  The daughter has been educated on potassium rich foods to avoid.

## 2019-04-13 NOTE — Telephone Encounter (Signed)
-----   Message from Sanda Klein, MD sent at 04/13/2019  6:14 PM EDT ----- Please reduce candesartan to 4 mg daily. Repeat BMET in 2 weeks. Continue to avoid K rich foods (fruit juices, bananas, etc).

## 2019-04-13 NOTE — Addendum Note (Signed)
Addended by: Ricci Barker on: 04/13/2019 06:35 PM   Modules accepted: Orders

## 2019-04-13 NOTE — Telephone Encounter (Signed)
Will you address in Dr. Synthia Innocent absence?  Name of Medication: Lorazepam Name of Pharmacy: [facility] or Written Date and Quantity: 03/14/19, #45 Last Office Visit and Type: 03/05/19, f/u DM, CHF, etc Next Office Visit and Type: 04/30/19, CPE prt 2 Last Controlled Substance Agreement Date: none Last UDS: none

## 2019-04-16 NOTE — Telephone Encounter (Signed)
Received a call from patient's daughter [name]. Dr.Croitoru's recommendation given.

## 2019-04-19 ENCOUNTER — Telehealth: Payer: Self-pay

## 2019-04-19 NOTE — Telephone Encounter (Signed)
LVM w COVID screen, front door and back lab info 11.5.2020 TLJ

## 2019-04-22 ENCOUNTER — Other Ambulatory Visit: Payer: Self-pay | Admitting: Family Medicine

## 2019-04-22 DIAGNOSIS — E785 Hyperlipidemia, unspecified: Secondary | ICD-10-CM

## 2019-04-22 DIAGNOSIS — E1165 Type 2 diabetes mellitus with hyperglycemia: Secondary | ICD-10-CM

## 2019-04-22 DIAGNOSIS — IMO0002 Reserved for concepts with insufficient information to code with codable children: Secondary | ICD-10-CM

## 2019-04-22 DIAGNOSIS — E0590 Thyrotoxicosis, unspecified without thyrotoxic crisis or storm: Secondary | ICD-10-CM

## 2019-04-22 DIAGNOSIS — E559 Vitamin D deficiency, unspecified: Secondary | ICD-10-CM

## 2019-04-23 ENCOUNTER — Ambulatory Visit (INDEPENDENT_AMBULATORY_CARE_PROVIDER_SITE_OTHER): Payer: Medicare HMO

## 2019-04-23 ENCOUNTER — Other Ambulatory Visit: Payer: Self-pay

## 2019-04-23 ENCOUNTER — Other Ambulatory Visit (INDEPENDENT_AMBULATORY_CARE_PROVIDER_SITE_OTHER): Payer: Medicare HMO

## 2019-04-23 DIAGNOSIS — E118 Type 2 diabetes mellitus with unspecified complications: Secondary | ICD-10-CM

## 2019-04-23 DIAGNOSIS — E0590 Thyrotoxicosis, unspecified without thyrotoxic crisis or storm: Secondary | ICD-10-CM | POA: Diagnosis not present

## 2019-04-23 DIAGNOSIS — E559 Vitamin D deficiency, unspecified: Secondary | ICD-10-CM | POA: Diagnosis not present

## 2019-04-23 DIAGNOSIS — E785 Hyperlipidemia, unspecified: Secondary | ICD-10-CM

## 2019-04-23 DIAGNOSIS — E1165 Type 2 diabetes mellitus with hyperglycemia: Secondary | ICD-10-CM

## 2019-04-23 DIAGNOSIS — Z0000 Encounter for general adult medical examination without abnormal findings: Secondary | ICD-10-CM | POA: Diagnosis not present

## 2019-04-23 DIAGNOSIS — IMO0002 Reserved for concepts with insufficient information to code with codable children: Secondary | ICD-10-CM

## 2019-04-23 LAB — COMPREHENSIVE METABOLIC PANEL
ALT: 11 U/L (ref 0–35)
AST: 14 U/L (ref 0–37)
Albumin: 4.3 g/dL (ref 3.5–5.2)
Alkaline Phosphatase: 87 U/L (ref 39–117)
BUN: 23 mg/dL (ref 6–23)
CO2: 29 mEq/L (ref 19–32)
Calcium: 9.7 mg/dL (ref 8.4–10.5)
Chloride: 102 mEq/L (ref 96–112)
Creatinine, Ser: 0.89 mg/dL (ref 0.40–1.20)
GFR: 60.98 mL/min (ref >60.00)
Glucose, Bld: 168 mg/dL — ABNORMAL HIGH (ref 70–99)
Potassium: 5.3 mEq/L — ABNORMAL HIGH (ref 3.5–5.1)
Sodium: 139 mEq/L (ref 135–145)
Total Bilirubin: 0.4 mg/dL (ref 0.2–1.2)
Total Protein: 7.2 g/dL (ref 6.0–8.3)

## 2019-04-23 LAB — LIPID PANEL
Cholesterol: 194 mg/dL (ref 0–200)
HDL: 33.2 mg/dL — ABNORMAL LOW (ref >39.00)
LDL Cholesterol: 123 mg/dL — ABNORMAL HIGH (ref 0–99)
NonHDL: 160.71
Total CHOL/HDL Ratio: 6
Triglycerides: 189 mg/dL — ABNORMAL HIGH (ref 0.0–149.0)
VLDL: 37.8 mg/dL (ref 0.0–40.0)

## 2019-04-23 LAB — VITAMIN D 25 HYDROXY (VIT D DEFICIENCY, FRACTURES): VITD: 46.77 ng/mL (ref 30.00–100.00)

## 2019-04-23 LAB — HEMOGLOBIN A1C: Hgb A1c MFr Bld: 7.2 % — ABNORMAL HIGH (ref 4.6–6.5)

## 2019-04-23 LAB — TSH: TSH: 1.25 u[IU]/mL (ref 0.35–4.50)

## 2019-04-23 NOTE — Patient Instructions (Signed)
Meredith Gutierrez , Thank you for taking time to come for your Medicare Wellness Visit. I appreciate your ongoing commitment to your health goals. Please review the following plan we discussed and let me know if I can assist you in the future.   Screening recommendations/referrals: Colonoscopy: no longer required Mammogram: no longer required Bone Density: Up to date, completed 02/05/2016 Recommended yearly ophthalmology/optometry visit for glaucoma screening and checkup Recommended yearly dental visit for hygiene and checkup  Vaccinations: Influenza vaccine: Up to date, completed 03/05/2019 Pneumococcal vaccine: Completed series Tdap vaccine: declined Shingles vaccine: declined     Advanced directives: copy in chart  Conditions/risks identified: diabetes  Next appointment: 04/30/2019 @ 3 pm    Preventive Care 48 Years and Older, Female Preventive care refers to lifestyle choices and visits with your health care provider that can promote health and wellness. What does preventive care include?  A yearly physical exam. This is also called an annual well check.  Dental exams once or twice a year.  Routine eye exams. Ask your health care provider how often you should have your eyes checked.  Personal lifestyle choices, including:  Daily care of your teeth and gums.  Regular physical activity.  Eating a healthy diet.  Avoiding tobacco and drug use.  Limiting alcohol use.  Practicing safe sex.  Taking low-dose aspirin every day.  Taking vitamin and mineral supplements as recommended by your health care provider. What happens during an annual well check? The services and screenings done by your health care provider during your annual well check will depend on your age, overall health, lifestyle risk factors, and family history of disease. Counseling  Your health care provider may ask you questions about your:  Alcohol use.  Tobacco use.  Drug use.  Emotional well-being.   Home and relationship well-being.  Sexual activity.  Eating habits.  History of falls.  Memory and ability to understand (cognition).  Work and work Statistician.  Reproductive health. Screening  You may have the following tests or measurements:  Height, weight, and BMI.  Blood pressure.  Lipid and cholesterol levels. These may be checked every 5 years, or more frequently if you are over 84 years old.  Skin check.  Lung cancer screening. You may have this screening every year starting at age 8 if you have a 30-pack-year history of smoking and currently smoke or have quit within the past 15 years.  Fecal occult blood test (FOBT) of the stool. You may have this test every year starting at age 43.  Flexible sigmoidoscopy or colonoscopy. You may have a sigmoidoscopy every 5 years or a colonoscopy every 10 years starting at age 66.  Hepatitis C blood test.  Hepatitis B blood test.  Sexually transmitted disease (STD) testing.  Diabetes screening. This is done by checking your blood sugar (glucose) after you have not eaten for a while (fasting). You may have this done every 1-3 years.  Bone density scan. This is done to screen for osteoporosis. You may have this done starting at age 62.  Mammogram. This may be done every 1-2 years. Talk to your health care provider about how often you should have regular mammograms. Talk with your health care provider about your test results, treatment options, and if necessary, the need for more tests. Vaccines  Your health care provider may recommend certain vaccines, such as:  Influenza vaccine. This is recommended every year.  Tetanus, diphtheria, and acellular pertussis (Tdap, Td) vaccine. You may need a Td booster  every 10 years.  Zoster vaccine. You may need this after age 18.  Pneumococcal 13-valent conjugate (PCV13) vaccine. One dose is recommended after age 67.  Pneumococcal polysaccharide (PPSV23) vaccine. One dose is  recommended after age 15. Talk to your health care provider about which screenings and vaccines you need and how often you need them. This information is not intended to replace advice given to you by your health care provider. Make sure you discuss any questions you have with your health care provider. Document Released: 06/27/2015 Document Revised: 02/18/2016 Document Reviewed: 04/01/2015 Elsevier Interactive Patient Education  2017 [place] Prevention in the Home Falls can cause injuries. They can happen to people of all ages. There are many things you can do to make your home safe and to help prevent falls. What can I do on the outside of my home?  Regularly fix the edges of walkways and driveways and fix any cracks.  Remove anything that might make you trip as you walk through a door, such as a raised step or threshold.  Trim any bushes or trees on the path to your home.  Use bright outdoor lighting.  Clear any walking paths of anything that might make someone trip, such as rocks or tools.  Regularly check to see if handrails are loose or broken. Make sure that both sides of any steps have handrails.  Any raised decks and porches should have guardrails on the edges.  Have any leaves, snow, or ice cleared regularly.  Use sand or salt on walking paths during winter.  Clean up any spills in your garage right away. This includes oil or grease spills. What can I do in the bathroom?  Use night lights.  Install grab bars by the toilet and in the tub and shower. Do not use towel bars as grab bars.  Use non-skid mats or decals in the tub or shower.  If you need to sit down in the shower, use a plastic, non-slip stool.  Keep the floor dry. Clean up any water that spills on the floor as soon as it happens.  Remove soap buildup in the tub or shower regularly.  Attach bath mats securely with double-sided non-slip rug tape.  Do not have throw rugs and other things on  the floor that can make you trip. What can I do in the bedroom?  Use night lights.  Make sure that you have a light by your bed that is easy to reach.  Do not use any sheets or blankets that are too big for your bed. They should not hang down onto the floor.  Have a firm chair that has side arms. You can use this for support while you get dressed.  Do not have throw rugs and other things on the floor that can make you trip. What can I do in the kitchen?  Clean up any spills right away.  Avoid walking on wet floors.  Keep items that you use a lot in easy-to-reach places.  If you need to reach something above you, use a strong step stool that has a grab bar.  Keep electrical cords out of the way.  Do not use floor polish or wax that makes floors slippery. If you must use wax, use non-skid floor wax.  Do not have throw rugs and other things on the floor that can make you trip. What can I do with my stairs?  Do not leave any items on the stairs.  Make  sure that there are handrails on both sides of the stairs and use them. Fix handrails that are broken or loose. Make sure that handrails are as long as the stairways.  Check any carpeting to make sure that it is firmly attached to the stairs. Fix any carpet that is loose or worn.  Avoid having throw rugs at the top or bottom of the stairs. If you do have throw rugs, attach them to the floor with carpet tape.  Make sure that you have a light switch at the top of the stairs and the bottom of the stairs. If you do not have them, ask someone to add them for you. What else can I do to help prevent falls?  Wear shoes that:  Do not have high heels.  Have rubber bottoms.  Are comfortable and fit you well.  Are closed at the toe. Do not wear sandals.  If you use a stepladder:  Make sure that it is fully opened. Do not climb a closed stepladder.  Make sure that both sides of the stepladder are locked into place.  Ask someone to  hold it for you, if possible.  Clearly mark and make sure that you can see:  Any grab bars or handrails.  First and last steps.  Where the edge of each step is.  Use tools that help you move around (mobility aids) if they are needed. These include:  Canes.  Walkers.  Scooters.  Crutches.  Turn on the lights when you go into a dark area. Replace any light bulbs as soon as they burn out.  Set up your furniture so you have a clear path. Avoid moving your furniture around.  If any of your floors are uneven, fix them.  If there are any pets around you, be aware of where they are.  Review your medicines with your doctor. Some medicines can make you feel dizzy. This can increase your chance of falling. Ask your doctor what other things that you can do to help prevent falls. This information is not intended to replace advice given to you by your health care provider. Make sure you discuss any questions you have with your health care provider. Document Released: 03/27/2009 Document Revised: 11/06/2015 Document Reviewed: 07/05/2014 Elsevier Interactive Patient Education  2017 [employer].

## 2019-04-23 NOTE — Progress Notes (Addendum)
Subjective:   Meredith Gutierrez is a 78 y.o. female who presents for Medicare Annual (Subsequent) preventive examination.  Review of Systems: N/A   This visit is being conducted through telemedicine via telephone at the nurse health advisor's home address due to the COVID-19 pandemic. This patient has given me verbal consent via doximity to conduct this visit, patient states they are participating from their home address. Patient has given verbal consent for me to speak with her daughter Meredith Gutierrez).Patient, myself and Meredith Gutierrez are on the telephone call. There is no referral for this visit. Some vital signs may be absent or patient reported.    Patient identification: identified by name, DOB, and current address   Cardiac Risk Factors include: advanced age (>53mn, >>74women);diabetes mellitus;sedentary lifestyle     Objective:     Vitals: There were no vitals taken for this visit.  There is no height or weight on file to calculate BMI.  Advanced Directives 04/23/2019 02/22/2019 12/20/2018 04/28/2018 04/21/2018 04/07/2018 04/06/2017  Does Patient Have a Medical Advance Directive? Yes Yes Yes Yes Yes Yes No;Yes  Type of AParamedicof [place]nappaLiving will [place]burgLiving will Healthcare Power of AConnorvilleof ABillingsLiving will HWashington ParkLiving will HBurr OakLiving will  Does patient want to make changes to medical advance directive? - - No - Guardian declined - No - Patient declined - -  Copy of HSharpsvillein Chart? Yes - validated most recent copy scanned in chart (See row information) - - - Yes - validated most recent copy scanned in chart (See row information) Yes Yes  Pre-existing out of facility DNR order (yellow form or pink MOST form) - - - - - - -    Tobacco Social History   Tobacco Use  Smoking Status Former Smoker  .  Packs/day: 0.10  . Years: 20.00  . Pack years: 2.00  . Types: Cigarettes  . Quit date: 09/14/2017  . Years since quitting: 1.6  Smokeless Tobacco Never Used  Tobacco Comment   smoking cessation info given     Counseling given: Not Answered Comment: smoking cessation info given   Clinical Intake:  Pre-visit preparation completed: Yes  Pain : No/denies pain     Nutritional Risks: None Diabetes: Yes CBG done?: No Did pt. bring in CBG monitor from home?: No  How often do you need to have someone help you when you read instructions, pamphlets, or other written materials from your doctor or pharmacy?: 1 - Never What is the last grade level you completed in school?: 12th  Interpreter Needed?: No  Information entered by :: CJohnson, LPN  Past Medical History:  Diagnosis Date  . Angina    never needed to take nitroglycerin.  . Anxiety   . Blood transfusion without reported diagnosis   . Cerebral atherosclerosis   . COPD (chronic obstructive pulmonary disease) ([facility]    02-16-13-"pt. denies this"-shows on CXR of 2- 2013.  .Marland KitchenCoronary artery disease 2003   s/p CABG 2014  . Dementia ([place]   . Diabetes mellitus without complication ([place] 125/9563  [place]  . Diastolic CHF, on admit treated. 08/10/2011  . Family history of adverse reaction to anesthesia    Dad and Sister had trouble waking up  . Fibromuscular dysplasia of renal artery (H[facility])    right  . Heart murmur   . History of kidney stones 02-16-13   "  hx. of overgrowth of muscle-causing some stricture"renal artery stenosis-being followed [place]  . Hyperlipidemia   . Hypertension   . Hyperthyroidism    [place]  . Hyponatremia 04/2018  . Melanoma ([place]) 2010   skin cancers, 1 melanoma removed back  . Osteoarthritis    Past Surgical History:  Procedure Laterality Date  . ABDOMINAL HYSTERECTOMY  1970   heavy bleeding  . APPENDECTOMY  1960s  . CARDIAC CATHETERIZATION  08/09/2011   no sign. coronary obstruction. LIMA is  atretic w/excellent flow down the native LAD  . CARDIAC CATHETERIZATION  08/22/2001   no sign. coronary stenosis,  . CATARACT EXTRACTION, BILATERAL Bilateral   . CESAREAN SECTION    . CORONARY ARTERY BYPASS GRAFT  02-16-13   3'03-no bypasses -Had 2 aneurysm repairs(stents used)  . ESOPHAGOGASTRODUODENOSCOPY  04/2018   LA Grade A esophagitis, small nodule distal esophagus (benign schwann cell hamartoma), empiric dilation to 26m ([place])  . LAPAROTOMY N/A 02/20/2013   Procedure: EXPLORATORY LAPAROTOMY;  WJanie Morning MD  . LEFT HEART CATHETERIZATION WITH CORONARY ANGIOGRAM N/A 08/09/2011   Procedure: LEFT HEART CATHETERIZATION WITH CORONARY ANGIOGRAM;  Surgeon: MSanda Klein MD;  [facility];  Service: Cardiovascular;  Laterality: N/A;  . NM MYOCAR PERF WALL MOTION  07/20/2010   normal  . RADIOLOGY WITH ANESTHESIA N/A 02/22/2019   Procedure: MRI OF ABDOMEN WITH AND WITHOUT CONTRAST;  Surgeon: Radiologist, Medication, MD;  [facility]  Service: Radiology;  Laterality: N/A;  . SALPINGOOPHORECTOMY Bilateral 02/20/2013   benign seromucinous cystadenofibroma R ovary ([name] MD)  . TOOTH EXTRACTION    . TOTAL HIP ARTHROPLASTY Left 2015   Family History  Problem Relation Age of Onset  . Stroke Mother   . Hypotension Mother   . Dementia Mother 680 . Coronary artery disease Father   . Hypertension Father   . Breast cancer Maternal Aunt   . Hypotension Sister   . Hypertension Brother   . Diabetes Brother   . Lung cancer Daughter   . Thyroid cancer Daughter   . Colon polyps Maternal Grandfather   . Colon cancer Neg Hx   . Esophageal cancer Neg Hx   . Stomach cancer Neg Hx   . Rectal cancer Neg Hx    Social History   Socioeconomic History  . Marital status: Widowed    Spouse name: Not on file  . Number of children: Not on file  . Years of education: Not on file  . Highest education level: Not on file  Occupational History  . Not on file  Social Needs  .  Financial resource strain: Not hard at all  . Food insecurity    Worry: Never true    Inability: Never true  . Transportation needs    Medical: No    Non-medical: No  Tobacco Use  . Smoking status: Former Smoker    Packs/day: 0.10    Years: 20.00    Pack years: 2.00    Types: Cigarettes    Quit date: 09/14/2017    Years since quitting: 1.6  . Smokeless tobacco: Never Used  . Tobacco comment: smoking cessation info given  Substance and Sexual Activity  . Alcohol use: No  . Drug use: No  . Sexual activity: Never  Lifestyle  . Physical activity    Days per week: 0 days    Minutes per session: 0 min  . Stress: Not at all  Relationships  . Social cHerbaliston phone: Not  on file    Gets together: Not on file    Attends religious service: Not on file    Active member of club or organization: Not on file    Attends meetings of clubs or organizations: Not on file    Relationship status: Not on file  Other Topics Concern  . Not on file  Social History Narrative   Widow   Lives with daughter and daughter's GF   Edu: HS   Occ: retired, worked at cafe   Activity: no regular exercise   Diet: good water     Outpatient Encounter Medications as of 04/23/2019  Medication Sig  . ACCU-CHEK FASTCLIX LANCETS MISC 1 each by Does not apply route daily. Use as directed to check blood sugar once daily.  Dx code:  E11.65  . albuterol (PROVENTIL) (2.5 MG/3ML) 0.083% nebulizer solution Take 3 mLs (2.5 mg total) by nebulization every 6 (six) hours as needed for wheezing or shortness of breath.  . Alcohol Swabs (B-D SINGLE USE SWABS REGULAR) PADS 1 each by Does not apply route daily. Use as directed to check blood sugar once daily.  Dx code:  E11.65  . aspirin EC 81 MG tablet Take 81 mg by mouth daily.  . Blood Glucose Monitoring Suppl (ACCU-CHEK AVIVA PLUS) w/Device KIT 1 each by Does not apply route daily. Use as directed to check blood sugar once daily.  Dx code:  E11.65  .  canagliflozin (INVOKANA) 100 MG TABS tablet Take 1 tablet (100 mg total) by mouth daily before breakfast.  . carboxymethylcellulose (REFRESH PLUS) 0.5 % SOLN Place 1 drop into both eyes 2 (two) times daily as needed (for irritation).   . Cholecalciferol (VITAMIN D) 2000 units CAPS Take 1 capsule (2,000 Units total) by mouth daily. (Patient taking differently: Take 2,000 Units by mouth daily. )  . donepezil (ARICEPT) 5 MG tablet TAKE ONE TABLET BY MOUTH AT BEDTIME  . furosemide (LASIX) 40 MG tablet Take 120 mg (3 tablets) once daily for a weight of 135 pounds and over and take 80 mg (2 tablets) once daily for a weight of 134 and below. (Patient taking differently: Take 80-120 mg by mouth See admin instructions. Take 120 mg (3 tablets) once daily for a weight of 135 pounds and over and take 80 mg (2 tablets) once daily for a weight of 134 and below.)  . glucose blood (ACCU-CHEK AVIVA PLUS) test strip 1 each by Other route as needed for other. Use as instructed to check blood sugar once daily.  Dx code:  E11.65  . LORazepam (ATIVAN) 0.5 MG tablet TAKE 1 TABLET BY MOUTH DAILY AT BEDTIME AS NEEDED FOR ANXIETY OR SLEEP MAY TAKE SECOND 1/2 TABLET AS NEEDED  . metFORMIN (GLUCOPHAGE-XR) 500 MG 24 hr tablet Take 2 tablets (1,000 mg total) by mouth daily with breakfast.  . morphine (MS CONTIN) 15 MG 12 hr tablet Take 15 mg by mouth See admin instructions. Take 15 mg by mouth two times a day- 10 AM and 6 PM  . morphine (MSIR) 15 MG tablet Take 15 mg by mouth See admin instructions. Take 15 mg by mouth four times a day- 6:30 AM, 7:30 AM, 1:00 PM, 3:00 PM  . polyethylene glycol (MIRALAX / GLYCOLAX) 17 g packet Take 17 g by mouth daily as needed. (Patient taking differently: Take 17 g by mouth daily as needed for mild constipation. )  . potassium chloride SA (K-DUR) 20 MEQ tablet Take 60 mEq (3 tablets) when you  take the 120 mg of the Furosemide and take 40 mEq (2 tablets) when you take the 80 mg of the Furosemide  (Patient taking differently: Take 40-60 mEq by mouth See admin instructions. Take 60 mEq (3 tablets) when you take the 120 mg of the Furosemide and take 40 mEq (2 tablets) when you take the 80 mg of the Furosemide)  . senna-docusate (SENOKOT-S) 8.6-50 MG tablet Take 1 tablet by mouth at bedtime.  . sodium chloride (OCEAN) 0.65 % SOLN nasal spray Place 1 spray into both nostrils as needed for congestion.   No facility-administered encounter medications on file as of 04/23/2019.     Activities of Daily Living In your present state of health, do you have any difficulty performing the following activities: 04/23/2019 02/22/2019  Hearing? Y -  Comment refuses hearing aids -  Vision? N -  Difficulty concentrating or making decisions? Y -  Comment dementia -  Walking or climbing stairs? Y -  Comment with assistance -  Dressing or bathing? N -  Doing errands, shopping? N Y  Conservation officer, nature and eating ? Y -  Using the Toilet? Y -  In the past six months, have you accidently leaked urine? N -  Do you have problems with loss of bowel control? N -  Managing your Medications? Y -  Managing your Finances? Y -  Housekeeping or managing your Housekeeping? Y -  Some recent data might be hidden    Patient Care Team: Ria Bush, MD as PCP - General (Family Medicine) Croitoru, Dani Gobble, MD as PCP - Cardiology (Cardiology)    Assessment:   This is a routine wellness examination for Meredith Gutierrez.  Exercise Activities and Dietary recommendations Current Exercise Habits: The patient does not participate in regular exercise at present, Exercise limited by: None identified  Goals    . Increase physical activity     Starting 04/07/2018, I will continue to walk at least 15 min daily.     . Patient Stated     04/23/2019, I will maintain and continue medications as prescribed.        Fall Risk Fall Risk  04/23/2019 04/07/2018 04/06/2017  Falls in the past year? 0 No No  Number falls in past yr: 0 - -   Injury with Fall? 0 - -  Risk for fall due to : Medication side effect - -  Follow up Falls evaluation completed;Falls prevention discussed - -   Is the patient's home free of loose throw rugs in walkways, pet beds, electrical cords, etc?   yes      Grab bars in the bathroom? yes      Handrails on the stairs?   yes      Adequate lighting?   yes  Timed Get Up and Go performed: N/A  Depression Screen PHQ 2/9 Scores 04/23/2019 04/07/2018 04/06/2017  PHQ - 2 Score 0 0 0  PHQ- 9 Score 0 0 0     Cognitive Function MMSE - Mini Mental State Exam 04/23/2019 04/07/2018 04/06/2017  Not completed: Unable to complete (No Data) -  Orientation to time - - 5  Orientation to Place - - 5  Registration - - 3  Attention/ Calculation - - 0  Recall - - 0  Recall-comments - - unable to recall 3 of 3 words  Language- name 2 objects - - 0  Language- repeat - - 1  Language- follow 3 step command - - 1  Language- follow 3 step command-comments - -  unable to follow 2 steps of 3 step command  Language- read & follow direction - - 0  Write a sentence - - 0  Copy design - - 0  Total score - - 15  Mini Cog  Mini-Cog screen was not completed. Patient has dementia. Maximum score is 22. A value of 0 denotes this part of the MMSE was not completed or the patient failed this part of the Mini-Cog screening.       Immunization History  Administered Date(s) Administered  . Fluad Quad(high Dose 65+) 03/05/2019  . Influenza,inj,Quad PF,6+ Mos 04/06/2017, 04/03/2018  . Pneumococcal Conjugate-13 12/02/2014  . Pneumococcal Polysaccharide-23 06/27/2008, 12/22/2018  . Zoster 04/16/2014    Qualifies for Shingles Vaccine? Yes  Screening Tests Health Maintenance  Topic Date Due  . URINE MICROALBUMIN  01/03/1951  . DTaP/Tdap/Td (1 - Tdap) 04/07/2027 (Originally 01/03/1960)  . TETANUS/TDAP  04/07/2027 (Originally 01/03/1960)  . HEMOGLOBIN A1C  10/21/2019  . OPHTHALMOLOGY EXAM  11/13/2019  . FOOT EXAM   03/04/2020  . INFLUENZA VACCINE  Completed  . DEXA SCAN  Completed  . PNA vac Low Risk Adult  Completed    Cancer Screenings: Lung: Low Dose CT Chest recommended if Age 80-80 years, 30 pack-year currently smoking OR have quit w/in 15years. Patient does not qualify. Breast:  Up to date on Mammogram? Yes, no longer required  Up to date of Bone Density/Dexa? Yes Colorectal: no longer required  Additional Screenings:  Hepatitis C Screening: N/A     Plan:   Patient will maintain and continue medications as prescribed.   I have personally reviewed and noted the following in the patient's chart:   . Medical and social history . Use of alcohol, tobacco or illicit drugs  . Current medications and supplements . Functional ability and status . Nutritional status . Physical activity . Advanced directives . List of other physicians . Hospitalizations, surgeries, and ER visits in previous 12 months . Vitals . Screenings to include cognitive, depression, and falls . Referrals and appointments  In addition, I have reviewed and discussed with patient certain preventive protocols, quality metrics, and best practice recommendations. A written personalized care plan for preventive services as well as general preventive health recommendations were provided to patient.     Andrez Grime, LPN  [id number]

## 2019-04-23 NOTE — Progress Notes (Signed)
PCP notes:  Health Maintenance: none   Abnormal Screenings: none   Patient concerns: Patient wants to talk about her sleeping medication not working   Nurse concerns: none   Next PCP appt.: 04/30/2019 @ 3 pm

## 2019-04-27 DIAGNOSIS — I5032 Chronic diastolic (congestive) heart failure: Secondary | ICD-10-CM | POA: Diagnosis not present

## 2019-04-27 DIAGNOSIS — E875 Hyperkalemia: Secondary | ICD-10-CM | POA: Diagnosis not present

## 2019-04-27 DIAGNOSIS — I10 Essential (primary) hypertension: Secondary | ICD-10-CM | POA: Diagnosis not present

## 2019-04-27 LAB — BASIC METABOLIC PANEL
BUN/Creatinine Ratio: 26 (ref 12–28)
BUN: 22 mg/dL (ref 8–27)
CO2: 24 mmol/L (ref 20–29)
Calcium: 9.7 mg/dL (ref 8.7–10.3)
Chloride: 102 mmol/L (ref 96–106)
Creatinine, Ser: 0.86 mg/dL (ref 0.57–1.00)
GFR calc Af Amer: 74 mL/min/{1.73_m2} (ref >59)
GFR calc non Af Amer: 64 mL/min/{1.73_m2} (ref >59)
Glucose: 219 mg/dL — ABNORMAL HIGH (ref 65–99)
Potassium: 5.2 mmol/L (ref 3.5–5.2)
Sodium: 142 mmol/L (ref 134–144)

## 2019-04-28 LAB — CREATININE, URINE, RANDOM: Creatinine, Urine: 8.5 mg/dL

## 2019-04-28 LAB — POTASSIUM, URINE, RANDOM: Potassium Urine: 20.6 mmol/L

## 2019-04-28 LAB — SODIUM, URINE, RANDOM: Sodium, Ur: 115 mmol/L

## 2019-04-29 ENCOUNTER — Encounter (HOSPITAL_COMMUNITY): Payer: Self-pay | Admitting: Emergency Medicine

## 2019-04-29 ENCOUNTER — Other Ambulatory Visit: Payer: Self-pay

## 2019-04-29 ENCOUNTER — Inpatient Hospital Stay (HOSPITAL_COMMUNITY)
Admission: EM | Admit: 2019-04-29 | Discharge: 2019-05-01 | DRG: 641 | Disposition: A | Discharge status: Home / Self Care | Payer: Medicare HMO | Attending: Family Medicine | Admitting: Family Medicine

## 2019-04-29 ENCOUNTER — Emergency Department (HOSPITAL_COMMUNITY): Payer: Medicare HMO

## 2019-04-29 DIAGNOSIS — R0902 Hypoxemia: Secondary | ICD-10-CM | POA: Diagnosis not present

## 2019-04-29 DIAGNOSIS — E785 Hyperlipidemia, unspecified: Secondary | ICD-10-CM | POA: Diagnosis present

## 2019-04-29 DIAGNOSIS — Z7982 Long term (current) use of aspirin: Secondary | ICD-10-CM

## 2019-04-29 DIAGNOSIS — J449 Chronic obstructive pulmonary disease, unspecified: Secondary | ICD-10-CM | POA: Diagnosis present

## 2019-04-29 DIAGNOSIS — I700 Atherosclerosis of aorta: Secondary | ICD-10-CM | POA: Diagnosis present

## 2019-04-29 DIAGNOSIS — I25810 Atherosclerosis of coronary artery bypass graft(s) without angina pectoris: Secondary | ICD-10-CM | POA: Diagnosis not present

## 2019-04-29 DIAGNOSIS — I672 Cerebral atherosclerosis: Secondary | ICD-10-CM | POA: Diagnosis present

## 2019-04-29 DIAGNOSIS — Z833 Family history of diabetes mellitus: Secondary | ICD-10-CM

## 2019-04-29 DIAGNOSIS — Z882 Allergy status to sulfonamides status: Secondary | ICD-10-CM

## 2019-04-29 DIAGNOSIS — M1990 Unspecified osteoarthritis, unspecified site: Secondary | ICD-10-CM | POA: Diagnosis present

## 2019-04-29 DIAGNOSIS — Z87891 Personal history of nicotine dependence: Secondary | ICD-10-CM

## 2019-04-29 DIAGNOSIS — F419 Anxiety disorder, unspecified: Secondary | ICD-10-CM | POA: Diagnosis present

## 2019-04-29 DIAGNOSIS — E871 Hypo-osmolality and hyponatremia: Secondary | ICD-10-CM | POA: Diagnosis present

## 2019-04-29 DIAGNOSIS — I110 Hypertensive heart disease with heart failure: Secondary | ICD-10-CM | POA: Diagnosis present

## 2019-04-29 DIAGNOSIS — E0590 Thyrotoxicosis, unspecified without thyrotoxic crisis or storm: Secondary | ICD-10-CM | POA: Diagnosis present

## 2019-04-29 DIAGNOSIS — I2510 Atherosclerotic heart disease of native coronary artery without angina pectoris: Secondary | ICD-10-CM | POA: Diagnosis present

## 2019-04-29 DIAGNOSIS — Z885 Allergy status to narcotic agent status: Secondary | ICD-10-CM

## 2019-04-29 DIAGNOSIS — Z8371 Family history of colonic polyps: Secondary | ICD-10-CM

## 2019-04-29 DIAGNOSIS — E875 Hyperkalemia: Secondary | ICD-10-CM | POA: Diagnosis not present

## 2019-04-29 DIAGNOSIS — I773 Arterial fibromuscular dysplasia: Secondary | ICD-10-CM | POA: Diagnosis present

## 2019-04-29 DIAGNOSIS — E876 Hypokalemia: Principal | ICD-10-CM | POA: Diagnosis present

## 2019-04-29 DIAGNOSIS — Z96642 Presence of left artificial hip joint: Secondary | ICD-10-CM | POA: Diagnosis present

## 2019-04-29 DIAGNOSIS — I5032 Chronic diastolic (congestive) heart failure: Secondary | ICD-10-CM | POA: Diagnosis present

## 2019-04-29 DIAGNOSIS — Z888 Allergy status to other drugs, medicaments and biological substances status: Secondary | ICD-10-CM

## 2019-04-29 DIAGNOSIS — J849 Interstitial pulmonary disease, unspecified: Secondary | ICD-10-CM | POA: Diagnosis not present

## 2019-04-29 DIAGNOSIS — R6510 Systemic inflammatory response syndrome (SIRS) of non-infectious origin without acute organ dysfunction: Secondary | ICD-10-CM | POA: Diagnosis not present

## 2019-04-29 DIAGNOSIS — E119 Type 2 diabetes mellitus without complications: Secondary | ICD-10-CM | POA: Diagnosis present

## 2019-04-29 DIAGNOSIS — E559 Vitamin D deficiency, unspecified: Secondary | ICD-10-CM | POA: Diagnosis present

## 2019-04-29 DIAGNOSIS — R000 Tachycardia, unspecified: Secondary | ICD-10-CM | POA: Diagnosis not present

## 2019-04-29 DIAGNOSIS — Z7984 Long term (current) use of oral hypoglycemic drugs: Secondary | ICD-10-CM | POA: Diagnosis not present

## 2019-04-29 DIAGNOSIS — G8929 Other chronic pain: Secondary | ICD-10-CM | POA: Diagnosis present

## 2019-04-29 DIAGNOSIS — Z886 Allergy status to analgesic agent status: Secondary | ICD-10-CM

## 2019-04-29 DIAGNOSIS — R509 Fever, unspecified: Secondary | ICD-10-CM

## 2019-04-29 DIAGNOSIS — F05 Delirium due to known physiological condition: Secondary | ICD-10-CM | POA: Diagnosis present

## 2019-04-29 DIAGNOSIS — K861 Other chronic pancreatitis: Secondary | ICD-10-CM | POA: Diagnosis not present

## 2019-04-29 DIAGNOSIS — Z85820 Personal history of malignant melanoma of skin: Secondary | ICD-10-CM | POA: Diagnosis not present

## 2019-04-29 DIAGNOSIS — Z8249 Family history of ischemic heart disease and other diseases of the circulatory system: Secondary | ICD-10-CM

## 2019-04-29 DIAGNOSIS — Z808 Family history of malignant neoplasm of other organs or systems: Secondary | ICD-10-CM

## 2019-04-29 DIAGNOSIS — F0390 Unspecified dementia without behavioral disturbance: Secondary | ICD-10-CM | POA: Diagnosis not present

## 2019-04-29 DIAGNOSIS — R4182 Altered mental status, unspecified: Secondary | ICD-10-CM | POA: Diagnosis not present

## 2019-04-29 DIAGNOSIS — Z880 Allergy status to penicillin: Secondary | ICD-10-CM

## 2019-04-29 DIAGNOSIS — Z803 Family history of malignant neoplasm of breast: Secondary | ICD-10-CM

## 2019-04-29 DIAGNOSIS — Z823 Family history of stroke: Secondary | ICD-10-CM

## 2019-04-29 DIAGNOSIS — Z801 Family history of malignant neoplasm of trachea, bronchus and lung: Secondary | ICD-10-CM

## 2019-04-29 DIAGNOSIS — Z20828 Contact with and (suspected) exposure to other viral communicable diseases: Secondary | ICD-10-CM | POA: Diagnosis present

## 2019-04-29 LAB — COMPREHENSIVE METABOLIC PANEL
ALT: 14 U/L (ref 0–44)
AST: 16 U/L (ref 15–41)
Albumin: 3.9 g/dL (ref 3.5–5.0)
Alkaline Phosphatase: 78 U/L (ref 38–126)
Anion gap: 15 (ref 5–15)
BUN: 20 mg/dL (ref 8–23)
CO2: 22 mmol/L (ref 22–32)
Calcium: 9.5 mg/dL (ref 8.9–10.3)
Chloride: 102 mmol/L (ref 98–111)
Creatinine, Ser: 0.96 mg/dL (ref 0.44–1.00)
GFR calc Af Amer: >60 mL/min (ref >60)
GFR calc non Af Amer: 56 mL/min — ABNORMAL LOW (ref >60)
Glucose, Bld: 219 mg/dL — ABNORMAL HIGH (ref 70–99)
Potassium: 3.2 mmol/L — ABNORMAL LOW (ref 3.5–5.1)
Sodium: 139 mmol/L (ref 135–145)
Total Bilirubin: 0.9 mg/dL (ref 0.3–1.2)
Total Protein: 7.8 g/dL (ref 6.5–8.1)

## 2019-04-29 LAB — URINALYSIS, ROUTINE W REFLEX MICROSCOPIC
Bacteria, UA: NONE SEEN
Bilirubin Urine: NEGATIVE
Glucose, UA: >=500 mg/dL — AB
Hgb urine dipstick: NEGATIVE
Ketones, ur: NEGATIVE mg/dL
Leukocytes,Ua: NEGATIVE
Nitrite: NEGATIVE
Protein, ur: NEGATIVE mg/dL
Specific Gravity, Urine: 1.014 (ref 1.005–1.030)
pH: 5 (ref 5.0–8.0)

## 2019-04-29 LAB — CBC WITH DIFFERENTIAL/PLATELET
Abs Immature Granulocytes: 0.19 10*3/uL — ABNORMAL HIGH (ref 0.00–0.07)
Basophils Absolute: 0.1 10*3/uL (ref 0.0–0.1)
Basophils Relative: 0 %
Eosinophils Absolute: 0 10*3/uL (ref 0.0–0.5)
Eosinophils Relative: 0 %
HCT: 41.3 % (ref 36.0–46.0)
Hemoglobin: 13.6 g/dL (ref 12.0–15.0)
Immature Granulocytes: 1 %
Lymphocytes Relative: 3 %
Lymphs Abs: 0.6 10*3/uL — ABNORMAL LOW (ref 0.7–4.0)
MCH: 29.5 pg (ref 26.0–34.0)
MCHC: 32.9 g/dL (ref 30.0–36.0)
MCV: 89.6 fL (ref 80.0–100.0)
Monocytes Absolute: 1.2 10*3/uL — ABNORMAL HIGH (ref 0.1–1.0)
Monocytes Relative: 5 %
Neutro Abs: 19.9 10*3/uL — ABNORMAL HIGH (ref 1.7–7.7)
Neutrophils Relative %: 91 %
Platelets: 334 10*3/uL (ref 150–400)
RBC: 4.61 MIL/uL (ref 3.87–5.11)
RDW: 13.8 % (ref 11.5–15.5)
WBC: 21.9 10*3/uL — ABNORMAL HIGH (ref 4.0–10.5)
nRBC: 0 % (ref 0.0–0.2)

## 2019-04-29 LAB — C DIFFICILE QUICK SCREEN W PCR REFLEX
C Diff antigen: NEGATIVE
C Diff interpretation: NOT DETECTED
C Diff toxin: NEGATIVE

## 2019-04-29 LAB — APTT: aPTT: 29 seconds (ref 24–36)

## 2019-04-29 LAB — PROTIME-INR
INR: 1.4 — ABNORMAL HIGH (ref 0.8–1.2)
Prothrombin Time: 16.7 seconds — ABNORMAL HIGH (ref 11.4–15.2)

## 2019-04-29 LAB — GLUCOSE, CAPILLARY
Glucose-Capillary: 139 mg/dL — ABNORMAL HIGH (ref 70–99)
Glucose-Capillary: 153 mg/dL — ABNORMAL HIGH (ref 70–99)

## 2019-04-29 LAB — SARS CORONAVIRUS 2 (TAT 6-24 HRS): SARS Coronavirus 2: NEGATIVE

## 2019-04-29 LAB — LACTIC ACID, PLASMA: Lactic Acid, Venous: 1.4 mmol/L (ref 0.5–1.9)

## 2019-04-29 LAB — LIPASE, BLOOD: Lipase: 78 U/L — ABNORMAL HIGH (ref 11–51)

## 2019-04-29 MED ORDER — FUROSEMIDE 80 MG PO TABS
80.0000 mg | ORAL_TABLET | Freq: Every day | ORAL | Status: DC
  Administered 2019-04-30 – 2019-05-01 (×2): 80 mg via ORAL
  Filled 2019-04-29 (×2): qty 1

## 2019-04-29 MED ORDER — ASPIRIN EC 81 MG PO TBEC
81.0000 mg | DELAYED_RELEASE_TABLET | Freq: Every day | ORAL | Status: DC
  Administered 2019-04-30 – 2019-05-01 (×2): 81 mg via ORAL
  Filled 2019-04-29 (×2): qty 1

## 2019-04-29 MED ORDER — ENOXAPARIN SODIUM 40 MG/0.4ML ~~LOC~~ SOLN
40.0000 mg | SUBCUTANEOUS | Status: DC
  Administered 2019-04-30: 40 mg via SUBCUTANEOUS
  Filled 2019-04-29: qty 0.4

## 2019-04-29 MED ORDER — FUROSEMIDE 20 MG PO TABS: 80.0000 mg | ORAL_TABLET | ORAL | Status: DC

## 2019-04-29 MED ORDER — ACETAMINOPHEN 325 MG PO TABS
650.0000 mg | ORAL_TABLET | Freq: Once | ORAL | Status: AC | PRN
  Administered 2019-04-29: 650 mg via ORAL
  Filled 2019-04-29: qty 2

## 2019-04-29 MED ORDER — LORAZEPAM 0.5 MG PO TABS
0.5000 mg | ORAL_TABLET | Freq: Every evening | ORAL | Status: DC | PRN
  Administered 2019-04-29 – 2019-04-30 (×2): 0.5 mg via ORAL
  Filled 2019-04-29 (×2): qty 1

## 2019-04-29 MED ORDER — FUROSEMIDE 20 MG PO TABS: 40.0000 mg | ORAL_TABLET | Freq: Every day | ORAL | Status: DC

## 2019-04-29 MED ORDER — INSULIN ASPART 100 UNIT/ML ~~LOC~~ SOLN
0.0000 [IU] | Freq: Three times a day (TID) | SUBCUTANEOUS | Status: DC
  Administered 2019-04-29: 1 [IU] via SUBCUTANEOUS
  Administered 2019-04-30 – 2019-05-01 (×3): 2 [IU] via SUBCUTANEOUS

## 2019-04-29 MED ORDER — SODIUM CHLORIDE 0.9% FLUSH: 3.0000 mL | Freq: Once | INTRAVENOUS | Status: DC

## 2019-04-29 MED ORDER — ACETAMINOPHEN 650 MG RE SUPP: 650.0000 mg | Freq: Four times a day (QID) | RECTAL | Status: DC | PRN

## 2019-04-29 MED ORDER — ACETAMINOPHEN 325 MG PO TABS
650.0000 mg | ORAL_TABLET | Freq: Four times a day (QID) | ORAL | Status: DC | PRN
  Administered 2019-04-29: 650 mg via ORAL
  Filled 2019-04-29: qty 2

## 2019-04-29 MED ORDER — ACETAMINOPHEN 325 MG PO TABS: 650.0000 mg | ORAL_TABLET | Freq: Once | ORAL | Status: DC | PRN

## 2019-04-29 MED ORDER — SODIUM CHLORIDE 0.9 % IV BOLUS
1000.0000 mL | Freq: Once | INTRAVENOUS | Status: AC
  Administered 2019-04-29: 1000 mL via INTRAVENOUS

## 2019-04-29 MED ORDER — POLYETHYLENE GLYCOL 3350 17 G PO PACK: 17.0000 g | PACK | Freq: Every day | ORAL | Status: DC | PRN

## 2019-04-29 MED ORDER — SODIUM CHLORIDE 0.9 % IV SOLN
1.0000 g | Freq: Three times a day (TID) | INTRAVENOUS | Status: DC
  Administered 2019-04-29 – 2019-05-01 (×6): 1 g via INTRAVENOUS
  Filled 2019-04-29 (×9): qty 1

## 2019-04-29 MED ORDER — POLYETHYLENE GLYCOL 3350 17 G PO PACK: 17.0000 g | PACK | Freq: Every day | ORAL | Status: DC

## 2019-04-29 MED ORDER — MORPHINE SULFATE ER 15 MG PO TBCR
15.0000 mg | EXTENDED_RELEASE_TABLET | ORAL | Status: DC
  Administered 2019-04-29 – 2019-04-30 (×3): 15 mg via ORAL
  Filled 2019-04-29 (×3): qty 1

## 2019-04-29 MED ORDER — SODIUM CHLORIDE 0.9 % IV SOLN
2.0000 g | Freq: Once | INTRAVENOUS | Status: AC
  Administered 2019-04-29: 2 g via INTRAVENOUS
  Filled 2019-04-29: qty 2

## 2019-04-29 MED ORDER — POTASSIUM CHLORIDE CRYS ER 20 MEQ PO TBCR: 40.0000 meq | EXTENDED_RELEASE_TABLET | Freq: Every day | ORAL | Status: DC

## 2019-04-29 MED ORDER — VANCOMYCIN HCL IN DEXTROSE 750-5 MG/150ML-% IV SOLN
750.0000 mg | INTRAVENOUS | Status: DC
  Administered 2019-04-30: 750 mg via INTRAVENOUS
  Filled 2019-04-29 (×2): qty 150

## 2019-04-29 MED ORDER — METRONIDAZOLE IN NACL 5-0.79 MG/ML-% IV SOLN
500.0000 mg | Freq: Once | INTRAVENOUS | Status: AC
  Administered 2019-04-29: 500 mg via INTRAVENOUS
  Filled 2019-04-29: qty 100

## 2019-04-29 MED ORDER — VANCOMYCIN HCL IN DEXTROSE 1-5 GM/200ML-% IV SOLN
1000.0000 mg | Freq: Once | INTRAVENOUS | Status: DC
  Filled 2019-04-29: qty 200

## 2019-04-29 MED ORDER — POTASSIUM CHLORIDE CRYS ER 20 MEQ PO TBCR: 40.0000 meq | EXTENDED_RELEASE_TABLET | ORAL | Status: DC

## 2019-04-29 NOTE — Plan of Care (Signed)
  Problem: Pain Managment: Goal: General experience of comfort will improve Outcome: Progressing   Problem: Safety: Goal: Ability to remain free from injury will improve Outcome: Progressing   Problem: Skin Integrity: Goal: Risk for impaired skin integrity will decrease Outcome: Progressing   

## 2019-04-29 NOTE — ED Notes (Addendum)
Asked lab to retrieve blood cx's prior to IV abx admin.

## 2019-04-29 NOTE — Progress Notes (Signed)
Notified bedside nurse of need to order and draw blood cultures, per ED RN one set drawn but there is no documentation.

## 2019-04-29 NOTE — ED Triage Notes (Signed)
Pt to triage via [facility]> pt from home.  Daughter reports pt not acting like herself and lethargic.  Reports pain but unable to specify where pain is.  History of UTI.  EMS reports foul smell of urine at home.

## 2019-04-29 NOTE — Progress Notes (Signed)
Notified bedside nurse of need to draw blood cultures, is there a reason blood cultures not showing as being drawn yet?Meredith Gutierrez

## 2019-04-29 NOTE — Progress Notes (Signed)
Notified bedside nurse of need to draw blood cultures.  

## 2019-04-29 NOTE — Progress Notes (Signed)
Pt arrived to room 6N16 via stretcher from the ED. Received report from [place], [place]. See assessment. Will continue to monitor.

## 2019-04-29 NOTE — ED Notes (Signed)
Moderate amount of soft stool cleaned anf lenin changed  Stool specimen sent to lab

## 2019-04-29 NOTE — ED Notes (Signed)
T. 99.4 a

## 2019-04-29 NOTE — H&P (Addendum)
[facility] Admission History and Physical Service Pager: [phone]  Patient name: Meredith Gutierrez record number: [id number] Date of birth: 1941/02/26 Age: 78 y.o. Gender: female  Primary Care Provider: Ria Bush, MD Consultants: None Code Status: FULL CODE Preferred Emergency Contact: [name] (daughter) [phone] (cell)  Chief Complaint: Lethargy, urinary incontinence  Assessment and Plan: Meredith Gutierrez is a 78 y.o. female presenting with fever, urinary incontinence and lethargy, found to be meeting SIRS criteria. PMH is significant for chronic pancreatitis, UTIs, COPD, T2DM, CAD s/p CABG (March 2020), History of melanoma, dementia, Fibromuscular dysplasia of Renal Artery, dyslipidemia, and HTN.  SIRS:  Patient presents with fever to 103.1*F, waxing and waning sensorium, and decreased function from baseline including new incontinence and trouble walking/following commands. Leukocytosis present with a left shift, WBCs to 21.9, ANC to 19.9. Lactic acid WNL at 1.4. UA unremarkable, other than elevated glucose. Patient was tachycardic up to 120 bpm,  RR up to 27. She received 2L NS bolus in ED. CXR reveals no acute process: "stable chronic interstitial lung disease, stable L diaphragmatic eventration w/ stable mild adjacent left basilar atelectasis vs scarring." Three antibiotics started in the ED: aztreonam, vancomycin, flagyl. Blood cultures collected, however, they were collected after antibiotics given, which presents likely limited utility. Patient has occasional diarrhea chronically, unclear if this is a change from her baseline. Stool and urine cultures ordered, GI panel ordered, however stools have become more formed and overall stool output has decreased. Skin exam only reveals seborrheic keratoses. Pt stable, remains febrile, mildly tachpneic to 25, satting ~95% on RA. At this time, no obvious source of fever/infection. Pt last known  normal yesterday afternoon, no signs of facial drooping, hemi-paralysis, or trouble forming words.  Patient also has h/o strep bacteremia (GBS, s. Agalactiae) from July 2020 (pt had TTE echo at that time that was negative for any vegetations). Source of the GBS bacteremia at that time was believed to be pneumonia. She was treated with levaquin, which was advanced to vancomycin, and ultimately patient switched to PO linezolid and completed course as an outpatient. Of note pt has a history of anaphylactic response to PCNs. - Admit to FPTS, med-surg, attending Dr. Gwendlyn Deutscher - Continue vancomycin; d/c aztreonam, flagyl - F/U blood culture, urine culture, GI panel - CBC, BMP in the AM - Vitals per routine - Continuous pulse ox - Activity: up with assistance - NPO until bedside swallow evaluation, then diabetic diet - Incentive spirometry - Lovenox for DVT ppx - Hold further fluid resuscitation at this time, see below  HFpEF: Patient follows with Dr. Sallyanne Kuster with [facility]. Echo in 12/2018 with EF 60-65%, impaired relaxation present in LV during diastole. Pt has hyperdynamic LV systolic function, moderate LVH, and chronic diastolic heart failure. Patient's dry weight reported by daughter is 135 lbs, avoid dropping wt to <132 lbs. Lasix 17m daily when weight >135lbs (use 60 mEq K-Dur). Use Lasix 80 mg daily when weight <135 lbs (use 40 mEq K-Dur). - Lasix 866mfor now, will adjust daily as needed - Very judicious fluid resuscitation, no mIVF at this time - Daily weight, goal ~135lbs - Strict I/Os  Hypokalemia On admission, pt's K at 3.2. Patient has chronic hypokalemia and home medications include 60 mEq K-Dur when using 120 mg Lasix, and 40 mEq when using 80 mg Lasix.  - Daily BMP, replete as needed - K-Dur 40 mEq given in ED  CAD s/p CABG:  Patient is s/p CABG 2003 LIMA to  LAD and SVG to first diagonal, ligation of large LAD artery aneurysm, atretic LIMA with patent LAD at cardiac cath in 2013. She  has had no cardiac events since, but known hyperdynamic LV systolic function, moderate LVH, and chronic diastolic heart failure. She has hyperlipidemia and has failed numerous attempts at lipid lowering treatments (rosuvastatin, simvastatin, zetia, etc.). Unable to tolerate BB due to bradycardia. - Continue home ASA 81 mg daily  HTN:  Patient has history of HTN, currently not on any medications except Lasix. Pt is normotensive in ED, 100s/60s. Removed from candesartan recently due to hyperkalemia. Cannot tolerate BB due to bradycardia. - Continue to monitor daily  T2DM:  Chronic and uncontrolled, most recent A1c 7.2% ~ 1 week ago. Pt started an SGLT2, Canagliflozin, recently. Potential SE of UTI, yeast infection/groin infection, could be potential source of fever. Home meds include metformin 1071m daily. - Hold home medications - sSSI when not NPO - BG checks BID  Dementia:  Pt has h/o dementia, short term memory very poor, at baseline is oriented to self and can recognize her closest care givers, including her daughter. Patient frequently has sundowning. She has long standing prescription of Ativan 0.5 mg that works for pt and caregiver.  - Delirium precautions - Ativan 0.5 mg as needed for sleep, may take a second 1/2 tab if needed  COPD:  Long time smoking history. No recent flares. Pt is stable on RA, satting well. No complaint of cough or SOB. Home medications are albuterol nebulizer PRN. She has not needed this in some time. - Hold home medications  H/O Bacteremia: Had echo in July 2020 showing EF 60-65%, mild concentric LVH, no vegetation appreciated. Pt had Group B strep (s. Agalactiae) bacteremia in 12/2018. At the time she was treated with PO linezolid.  H/O L renal artery stenosis: No stenosis >60% on studies from 2016, 2018.  FEN/GI: NPO until bedside swallow study, then diabetic diet Prophylaxis: Lovenox  Disposition: to med-surg for observation  History of Present  Illness:  TDERIAN PFOSTis a 78y.o. female presenting with fever, urinary incontinence, and lethargy, meeting SIRS criteria. Pt has a h/o dementia, COPD, CAD, diabetes. She lives with daughter, CArbie Cookey and they were out and about all day yesterday, daughter reports everything was fine. Patient occasionally has some sundowning, and around 6:30pm she was getting anxious. At 10pm last night (11/14) daughter said she was acting strangely, speaking gibberish. This morning, daughter went to check on patient, who lives in a separate apartment in the basement of house, and patient had been incontinent of urine and stool (patient is continent at baseline), and couldn't stand up or get to the stairs. Patient was also not at her baseline mentally (oriented to self and daughter). Patient's temp per daughter was 921initially, when EMS took temp it was 100*F, at triage in hospital it was 103*F. Patient was more confused this AM, not oriented to self, with foul smelling urine, tachypneic and tachycardic in triage. When ED provider examined pt, she was not confused beyond baseline (AOx1), but then fell asleep in the middle of the conversation. She is easily arousable, but is more confused than previously, waxing and waning sensorium. Unclear source of fever at this time; there was a suspicion of UTI, but UA looks appropriate. Pt had diarrhea at home, and 1x in ED. She received IV fluids. COVID is still in consideration, daughter works near/around people in the community. No sick contacts, no recent hospitalization, not immune compromised.  No drooping in her face, nor last night. Patient's mother had Hx of TIA.  At baseline, patient is oriented to self and the people she sees often; she has terrible short term memory. She can carry on basic conversation at baseline, she can feed and dress herself, bathe herself, and toilet independently. She has trouble making decisions.  Review Of Systems: Per HPI with the following  additions:  Review of Systems  Constitutional: Positive for chills, fever and malaise/fatigue. Negative for diaphoresis.  HENT: Negative.   Eyes: Negative.   Respiratory: Negative.   Cardiovascular: Negative.   Gastrointestinal: Positive for diarrhea. Negative for nausea and vomiting.  Genitourinary:       Urinary incontinence   Musculoskeletal: Negative for falls, joint pain and myalgias.  Skin: Negative for itching and rash.  Neurological:       Waxing and waning sensorium  Psychiatric/Behavioral: Negative.     Patient Active Problem List   Diagnosis Date Noted  . Pain and swelling of right elbow 01/19/2019  . Chronic pancreatitis ([place]) 01/01/2019  . Bacteremia due to group B Streptococcus 12/21/2018  . H/O total hip arthroplasty, left 12/21/2018  . Liver mass 12/20/2018  . Adrenal adenoma, left 12/20/2018  . Community acquired pneumonia of left lower lobe of lung 12/20/2018  . Coronary artery disease involving coronary bypass graft of native heart without angina pectoris 08/19/2018  . Recurrent boils 08/17/2018  . Acute pain of left shoulder 08/17/2018  . Hamartoma ([place]) 08/02/2018  . Atherosclerosis of aorta ([place]) 06/29/2018  . Diabetes mellitus type 2, uncontrolled, with complications ([place]) 20/94/7096  . Systolic murmur 28/36/6294  . COPD (chronic obstructive pulmonary disease) ([place]) 04/20/2018  . Medicare annual wellness visit, subsequent 04/07/2018  . Chronic nasal congestion 04/03/2018  . Multiple atypical nevi 02/08/2018  . Genital warts 02/08/2018  . Sundowning 02/08/2018  . Pulmonary nodules 12/13/2017  . Esophageal dysphagia 09/07/2017  . Health maintenance examination 04/13/2017  . Decreased hearing, bilateral 04/13/2017  . Advanced care planning/counseling discussion 04/09/2017  . Vitamin D deficiency 02/14/2017  . Fibromuscular dysplasia of renal artery ([place])   . Chronic lower back pain 02/09/2017  . Dementia ([place]) 02/09/2017  . History of melanoma  02/09/2017  . Hyperthyroidism   . Chronic diastolic (congestive) heart failure ([place]) 08/10/2011  . Dyslipidemia, statin intol. 08/07/2011  . Sinus bradycardia, beta blocker intol. 08/07/2011  . Chest pain, negative MI, cath with atretic LIMA, and excellent flow down the native LAD,patent grafts otherwise 08/05/2011  . Essential hypertension 08/05/2011  . Ex-smoker 08/05/2011  . Hx of CABG 08/05/2011    Past Medical History: Past Medical History:  Diagnosis Date  . Angina    never needed to take nitroglycerin.  . Anxiety   . Blood transfusion without reported diagnosis   . Cerebral atherosclerosis   . COPD (chronic obstructive pulmonary disease) ([place])    02-16-13-"pt. denies this"-shows on CXR of 2- 2013.  Marland Kitchen Coronary artery disease 2003   s/p CABG 2014  . Dementia ([place])   . Diabetes mellitus without complication ([place]) 76/5465   NEW ONSET  . Diastolic CHF, on admit treated. 08/10/2011  . Family history of adverse reaction to anesthesia    Dad and Sister had trouble waking up  . Fibromuscular dysplasia of renal artery ([facility])    right  . Heart murmur   . History of kidney stones 02-16-13   "hx. of overgrowth of muscle-causing some stricture"renal artery stenosis-being followed [facility]  . Hyperlipidemia   . Hypertension   .  Hyperthyroidism    [place]  . Hyponatremia 04/2018  . Melanoma ([place]) 2010   skin cancers, 1 melanoma removed back  . Osteoarthritis     Past Surgical History: Past Surgical History:  Procedure Laterality Date  . ABDOMINAL HYSTERECTOMY  1970   heavy bleeding  . APPENDECTOMY  1960s  . CARDIAC CATHETERIZATION  08/09/2011   no sign. coronary obstruction. LIMA is atretic w/excellent flow down the native LAD  . CARDIAC CATHETERIZATION  08/22/2001   no sign. coronary stenosis,  . CATARACT EXTRACTION, BILATERAL Bilateral   . CESAREAN SECTION    . CORONARY ARTERY BYPASS GRAFT  02-16-13   3'03-no bypasses -Had 2 aneurysm repairs(stents used)  .  ESOPHAGOGASTRODUODENOSCOPY  04/2018   LA Grade A esophagitis, small nodule distal esophagus (benign schwann cell hamartoma), empiric dilation to 76m ([place])  . LAPAROTOMY N/A 02/20/2013   Procedure: EXPLORATORY LAPAROTOMY;  WJanie Morning MD  . LEFT HEART CATHETERIZATION WITH CORONARY ANGIOGRAM N/A 08/09/2011   Procedure: LEFT HEART CATHETERIZATION WITH CORONARY ANGIOGRAM;  Surgeon: MSanda Klein MD;  [facility];  Service: Cardiovascular;  Laterality: N/A;  . NM MYOCAR PERF WALL MOTION  07/20/2010   normal  . RADIOLOGY WITH ANESTHESIA N/A 02/22/2019   Procedure: MRI OF ABDOMEN WITH AND WITHOUT CONTRAST;  Surgeon: Radiologist, Medication, MD;  [facility]  Service: Radiology;  Laterality: N/A;  . SALPINGOOPHORECTOMY Bilateral 02/20/2013   benign seromucinous cystadenofibroma R ovary ([name] MD)  . TOOTH EXTRACTION    . TOTAL HIP ARTHROPLASTY Left 2015    Social History: Social History   Tobacco Use  . Smoking status: Former Smoker    Packs/day: 0.10    Years: 20.00    Pack years: 2.00    Types: Cigarettes    Quit date: 09/14/2017    Years since quitting: 1.6  . Smokeless tobacco: Never Used  . Tobacco comment: smoking cessation info given  Substance Use Topics  . Alcohol use: No  . Drug use: No   Additional social history:  Please also refer to relevant sections of EMR.  Family History: Family History  Problem Relation Age of Onset  . Stroke Mother   . Hypotension Mother   . Dementia Mother 622 . Coronary artery disease Father   . Hypertension Father   . Breast cancer Maternal Aunt   . Hypotension Sister   . Hypertension Brother   . Diabetes Brother   . Lung cancer Daughter   . Thyroid cancer Daughter   . Colon polyps Maternal Grandfather   . Colon cancer Neg Hx   . Esophageal cancer Neg Hx   . Stomach cancer Neg Hx   . Rectal cancer Neg Hx     Allergies and Medications: Allergies  Allergen Reactions  . Codeine Anaphylaxis and  Shortness Of Breath  . Penicillins Anaphylaxis and Shortness Of Breath    Has patient had a PCN reaction causing immediate rash, facial/tongue/throat swelling, SOB or lightheadedness with hypotension: YES Has patient had a PCN reaction causing severe rash involving mucus membranes or skin necrosis: NO Has patient had a PCN reaction that required hospitalization: NO Has patient had a PCN reaction occurring within the last 10 years: NO If all of the above answers are "NO", then may proceed with Cephalosporin use.  . Prednisolone Acetate Shortness Of Breath and Nausea Only  . Prednisone Hives, Shortness Of Breath and Swelling  . Meloxicam Hives  . Nitroglycerin Other (See Comments)    Reaction not recalled   .  Metolazone Other (See Comments)    Hypokalemia, hyponatremia  . Adhesive [Tape] Rash and Other (See Comments)    SKIN IS VERY SENSITIVE- TEARS AND BRUISES EASILY, also  . Crestor [Rosuvastatin Calcium] Other (See Comments)    Weakness with statins  . Lopressor [Metoprolol Tartrate] Other (See Comments)    Bradycardia   . Sulfamethoxazole Hives and Swelling  . Zetia [Ezetimibe] Other (See Comments)    Weakness    No current facility-administered medications on file prior to encounter.    Current Outpatient Medications on File Prior to Encounter  Medication Sig Dispense Refill  . albuterol (PROVENTIL) (2.5 MG/3ML) 0.083% nebulizer solution Take 3 mLs (2.5 mg total) by nebulization every 6 (six) hours as needed for wheezing or shortness of breath. 75 mL 1  . Ascorbic Acid (VITAMIN C WITH ROSE HIPS) 1000 MG tablet Take 1,000 mg by mouth daily.    Marland Kitchen aspirin EC 81 MG tablet Take 81 mg by mouth daily.    . canagliflozin (INVOKANA) 100 MG TABS tablet Take 1 tablet (100 mg total) by mouth daily before breakfast. 30 tablet 6  . carboxymethylcellulose (REFRESH PLUS) 0.5 % SOLN Place 1 drop into both eyes 2 (two) times daily as needed (for irritation).     . Cholecalciferol (VITAMIN D) 2000  units CAPS Take 1 capsule (2,000 Units total) by mouth daily. (Patient taking differently: Take 2,000 Units by mouth daily. ) 30 capsule   . furosemide (LASIX) 40 MG tablet Take 120 mg (3 tablets) once daily for a weight of 135 pounds and over and take 80 mg (2 tablets) once daily for a weight of 134 and below. (Patient taking differently: Take 80-120 mg by mouth See admin instructions. Take 120 mg (3 tablets) once daily for a weight of 135 pounds and over and take 80 mg (2 tablets) once daily for a weight of 134 and below.) 90 tablet 5  . LORazepam (ATIVAN) 0.5 MG tablet TAKE 1 TABLET BY MOUTH DAILY AT BEDTIME AS NEEDED FOR ANXIETY OR SLEEP MAY TAKE SECOND 1/2 TABLET AS NEEDED (Patient taking differently: Take 0.5 mg by mouth at bedtime as needed for anxiety or sleep (may take a second 1/2 tablet as needed). ) 45 tablet 0  . meloxicam (MOBIC) 7.5 MG tablet Take 7.5 mg by mouth daily as needed for pain.     . metFORMIN (GLUCOPHAGE-XR) 500 MG 24 hr tablet Take 2 tablets (1,000 mg total) by mouth daily with breakfast. 180 tablet 1  . morphine (MS CONTIN) 15 MG 12 hr tablet Take 15 mg by mouth See admin instructions. Take 15 mg by mouth two times a day- 10 AM and 6 PM AND may take 1 tablet (9m) every 6 hours as needed.    . polyethylene glycol (MIRALAX / GLYCOLAX) 17 g packet Take 17 g by mouth daily as needed. (Patient taking differently: Take 17 g by mouth every morning. ) 14 each 0  . sodium chloride (OCEAN) 0.65 % SOLN nasal spray Place 1 spray into both nostrils as needed for congestion.    .Marland KitchenACCU-CHEK FASTCLIX LANCETS MISC 1 each by Does not apply route daily. Use as directed to check blood sugar once daily.  Dx code:  E11.65 100 each 0  . Alcohol Swabs (B-D SINGLE USE SWABS REGULAR) PADS 1 each by Does not apply route daily. Use as directed to check blood sugar once daily.  Dx code:  E11.65 100 each 0  . Blood Glucose Monitoring Suppl (ACCU-CHEK AVIVA  PLUS) w/Device KIT 1 each by Does not apply route  daily. Use as directed to check blood sugar once daily.  Dx code:  E11.65 1 kit 0  . donepezil (ARICEPT) 5 MG tablet TAKE ONE TABLET BY MOUTH AT BEDTIME (Patient not taking: No sig reported) 30 tablet 3  . glucose blood (ACCU-CHEK AVIVA PLUS) test strip 1 each by Other route as needed for other. Use as instructed to check blood sugar once daily.  Dx code:  E11.65 100 each 0  . potassium chloride SA (K-DUR) 20 MEQ tablet Take 60 mEq (3 tablets) when you take the 120 mg of the Furosemide and take 40 mEq (2 tablets) when you take the 80 mg of the Furosemide (Patient taking differently: Take 40-60 mEq by mouth See admin instructions. Take 60 mEq (3 tablets) when you take the 120 mg of the Furosemide and take 40 mEq (2 tablets) when you take the 80 mg of the Furosemide) 90 tablet 5  . senna-docusate (SENOKOT-S) 8.6-50 MG tablet Take 1 tablet by mouth at bedtime. (Patient not taking: Reported on 04/29/2019)      Objective: BP (!) 148/74   Pulse 95   Temp (!) 102.6 F (39.2 C) (Rectal)   Resp (!) 26   Ht 5' (1.524 m)   Wt 61 kg   SpO2 97%   BMI 26.27 kg/m  Exam: Physical Exam Vitals signs and nursing note reviewed.  Constitutional:      General: She is not in acute distress.    Appearance: She is normal weight. She is ill-appearing and toxic-appearing.     Comments: Lethargic  HENT:     Head: Normocephalic and atraumatic.     Mouth/Throat:     Mouth: Mucous membranes are dry.     Comments: Cracked/dry lips with 3 mm healing crust on R side of bottom lip Eyes:     Conjunctiva/sclera: Conjunctivae normal.     Pupils: Pupils are equal, round, and reactive to light.  Cardiovascular:     Rate and Rhythm: Regular rhythm. Tachycardia present.     Heart sounds: Murmur (2/6 systolic murmur) present.  Pulmonary:     Effort: Pulmonary effort is normal. No respiratory distress.     Breath sounds: No stridor. Rales (crackles heard in bilateral lower lobes) present. No wheezing.  Chest:     Chest  wall: No tenderness.  Abdominal:     General: Abdomen is flat. Bowel sounds are normal. There is no distension.     Palpations: Abdomen is soft.     Tenderness: There is no abdominal tenderness. There is no guarding.  Musculoskeletal: Normal range of motion.        General: No swelling, tenderness, deformity or signs of injury.     Right lower leg: No edema.     Left lower leg: No edema.  Skin:    General: Skin is warm and dry.     Capillary Refill: Capillary refill takes less than 2 seconds.     Coloration: Skin is not jaundiced or pale.     Findings: No bruising.     Comments: Flushed appearance  Neurological:     General: No focal deficit present.     Mental Status: She is disoriented.     Cranial Nerves: No cranial nerve deficit.     Sensory: No sensory deficit.     Motor: Weakness present.     Coordination: Coordination normal.     Gait: Gait normal.  Deep Tendon Reflexes: Reflexes normal.     Comments: AOx1 with waxing and waning sensorium     Labs and Imaging: CBC BMET  Recent Labs  Lab 04/29/19 1032  WBC 21.9*  HGB 13.6  HCT 41.3  PLT 334   Recent Labs  Lab 04/29/19 1032  NA 139  K 3.2*  CL 102  CO2 22  BUN 20  CREATININE 0.96  GLUCOSE 219*  CALCIUM 9.5     EKG: EKG Interpretation  Date/Time:  Sunday April 29 2019 10:19:33 EST Ventricular Rate:  119 PR Interval:  174 QRS Duration: 74 QT Interval:  314 QTC Calculation: 441 R Axis:   14 Text Interpretation: Sinus tachycardia Cannot rule out Anterior infarct , age undetermined Abnormal ECG Confirmed by Madalyn Rob [phone]) on 04/29/2019 2:50:15 PM   Dg Chest 2 View  Result Date: 04/29/2019 CLINICAL DATA:  Altered mental status. Fever. EXAM: CHEST - 2 VIEW COMPARISON:  12/20/2018 FINDINGS: Normal sized heart. Post CABG changes. Poor inspiration with a mild increase in size of a left diaphragmatic eventration no significant change in mild adjacent left basilar atelectasis or scarring.  Otherwise, clear lungs with stable mildly prominent interstitial markings with normal vascularity. No pleural fluid. Thoracic spine degenerative changes and right shoulder loose bodies. IMPRESSION: 1. No acute abnormality. 2. Stable mild chronic interstitial lung disease. 3. Mild increase in size of a left diaphragmatic eventration with stable mild adjacent left basilar atelectasis or scarring. Electronically Signed   By: Claudie Revering M.D.   On: 04/29/2019 11:37    Gladys Damme, MD [place], PGY-1 [facility] Intern pager: [phone], text pages welcome   FPTS Upper-Level Resident Addendum   I have independently interviewed and examined the patient. I have discussed the above with the original author and agree with their documentation. My edits for correction/addition/clarification are in blue. Please see also any attending notes.    Milus Banister, DO PGY-2, [facility] 04/29/2019 6:00 PM  [place] [facility] pager: [phone] (text pages welcome through [place])

## 2019-04-29 NOTE — ED Notes (Signed)
Report called to [employer] on 6n

## 2019-04-29 NOTE — ED Notes (Signed)
1 set of blood cx's obtained by this RN

## 2019-04-29 NOTE — Progress Notes (Signed)
Notified bedside nurse of need to administer antibiotics.  

## 2019-04-29 NOTE — ED Notes (Signed)
Admitting doctor at  The bedside 

## 2019-04-29 NOTE — Progress Notes (Signed)
Pharmacy Antibiotic Note  Meredith Gutierrez is a 78 y.o. female admitted on 04/29/2019 with sepsis.  Pharmacy has been consulted for vancomycin and aztreonam dosing. Pt is febrile with Tmax 103.1 and WBC is elevated at 21.9. SCr is WNL.   Plan: Vancomycin 1gm IV x 1 then 750mg  IV Q24H Aztreonam 2gm IV x 1 then 1gm IV Q8H F/u renal fxn, C&S, clinical status and peak/trough at SS  Height: 5' (152.4 cm) Weight: 134 lb 8 oz (61 kg) IBW/kg (Calculated) : 45.5  Temp (24hrs), Avg:103.1 F (39.5 C), Min:103.1 F (39.5 C), Max:103.1 F (39.5 C)  Recent Labs  Lab 04/23/19 0746 04/27/19 0951 04/29/19 1032  WBC  --   --  21.9*  CREATININE 0.89 0.86  --     Estimated Creatinine Clearance: 42.6 mL/min (by C-G formula based on SCr of 0.86 mg/dL).    Allergies  Allergen Reactions  . Codeine Anaphylaxis and Shortness Of Breath  . Penicillins Anaphylaxis and Shortness Of Breath    Has patient had a PCN reaction causing immediate rash, facial/tongue/throat swelling, SOB or lightheadedness with hypotension: YES Has patient had a PCN reaction causing severe rash involving mucus membranes or skin necrosis: NO Has patient had a PCN reaction that required hospitalization: NO Has patient had a PCN reaction occurring within the last 10 years: NO If all of the above answers are "NO", then may proceed with Cephalosporin use.  . Prednisolone Acetate Shortness Of Breath and Nausea Only  . Prednisone Hives, Shortness Of Breath and Swelling  . Meloxicam Hives  . Nitroglycerin Other (See Comments)    Reaction not recalled   . Metolazone Other (See Comments)    Hypokalemia, hyponatremia  . Adhesive [Tape] Rash and Other (See Comments)    SKIN IS VERY SENSITIVE- TEARS AND BRUISES EASILY, also  . Crestor [Rosuvastatin Calcium] Other (See Comments)    Weakness with statins  . Lopressor [Metoprolol Tartrate] Other (See Comments)    Bradycardia   . Sulfamethoxazole Hives and Swelling  . Zetia [Ezetimibe]  Other (See Comments)    Weakness     Antimicrobials this admission: Vanc 11/15>> Aztreo 11/15>> Flagyl x 1 11/15  Dose adjustments this admission: N/A  Microbiology results: Pending  Thank you for allowing pharmacy to be a part of this patient's care.  Mariavictoria Nottingham, Rande Lawman 04/29/2019 11:00 AM

## 2019-04-29 NOTE — ED Provider Notes (Signed)
[facility] EMERGENCY DEPARTMENT Provider Note   CSN: [id number] Arrival date & time: 04/29/19  1007     History   Chief Complaint Chief Complaint  Patient presents with  . Altered Mental Status    HPI Meredith Gutierrez is a 78 y.o. female.     HPI   Level 5 caveat due to dementia.  Meredith Gutierrez is a 78 y.o. female, with a history of CAD, CABG, dementia, DM, diastolic CHF, HTN, presenting to the ED via EMS accompanied by her daughter at the bedside with complaint of altered mental status. Patient's daughter states patient seemed to be slower to respond and "seemed as though she was not feeling well" around 10 PM last night.  She went to bed shortly thereafter.  Upon the patient waking around 7 AM this morning, patient's daughter noted patient seemed unable to communicate well and seemed to be generally weak, but especially with weakness in her legs.  She was incontinent of urine and stool with diarrhea noted this morning. EMS noted foul smell to the patient's urine. Since that time, patient has overall improved back to her mental baseline, though she does seem "a little sleepier than normal." Patient daughter adds that they have been having difficulty with hyperkalemia in the recent weeks. Denies recent antibiotic use. Patient denies shortness of breath, cough, abdominal pain, chest pain, hematochezia/melena, dysuria, nausea/vomiting. Daughter also denies noted difficulty breathing, cough, complaints of chest pain/abdominal pain, syncope, falls/trauma, or any other abnormalities.   Past Medical History:  Diagnosis Date  . Angina    never needed to take nitroglycerin.  . Anxiety   . Blood transfusion without reported diagnosis   . Cerebral atherosclerosis   . COPD (chronic obstructive pulmonary disease) ([place])    02-16-13-"pt. denies this"-shows on CXR of 2- 2013.  Marland Kitchen Coronary artery disease 2003   s/p CABG 2014  . Dementia ([place])   . Diabetes mellitus  without complication ([place]) 01/6760   NEW ONSET  . Diastolic CHF, on admit treated. 08/10/2011  . Family history of adverse reaction to anesthesia    Dad and Sister had trouble waking up  . Fibromuscular dysplasia of renal artery ([facility])    right  . Heart murmur   . History of kidney stones 02-16-13   "hx. of overgrowth of muscle-causing some stricture"renal artery stenosis-being followed [facility]  . Hyperlipidemia   . Hypertension   . Hyperthyroidism    [place]  . Hyponatremia 04/2018  . Melanoma ([place]) 2010   skin cancers, 1 melanoma removed back  . Osteoarthritis     Patient Active Problem List   Diagnosis Date Noted  . Pain and swelling of right elbow 01/19/2019  . Chronic pancreatitis ([place]) 01/01/2019  . Bacteremia due to group B Streptococcus 12/21/2018  . H/O total hip arthroplasty, left 12/21/2018  . Liver mass 12/20/2018  . Adrenal adenoma, left 12/20/2018  . Community acquired pneumonia of left lower lobe of lung 12/20/2018  . Coronary artery disease involving coronary bypass graft of native heart without angina pectoris 08/19/2018  . Recurrent boils 08/17/2018  . Acute pain of left shoulder 08/17/2018  . Hamartoma ([place]) 08/02/2018  . Atherosclerosis of aorta ([place]) 06/29/2018  . Diabetes mellitus type 2, uncontrolled, with complications ([place]) 95/02/3266  . Systolic murmur 12/45/8099  . COPD (chronic obstructive pulmonary disease) ([place]) 04/20/2018  . Medicare annual wellness visit, subsequent 04/07/2018  . Chronic nasal congestion 04/03/2018  . Multiple atypical nevi 02/08/2018  . Genital warts 02/08/2018  .  Sundowning 02/08/2018  . Pulmonary nodules 12/13/2017  . Esophageal dysphagia 09/07/2017  . Health maintenance examination 04/13/2017  . Decreased hearing, bilateral 04/13/2017  . Advanced care planning/counseling discussion 04/09/2017  . Vitamin D deficiency 02/14/2017  . Fibromuscular dysplasia of renal artery ([place])   . Chronic lower back pain 02/09/2017  .  Dementia ([place]) 02/09/2017  . History of melanoma 02/09/2017  . Hyperthyroidism   . Chronic diastolic (congestive) heart failure ([place]) 08/10/2011  . Dyslipidemia, statin intol. 08/07/2011  . Sinus bradycardia, beta blocker intol. 08/07/2011  . Chest pain, negative MI, cath with atretic LIMA, and excellent flow down the native LAD,patent grafts otherwise 08/05/2011  . Essential hypertension 08/05/2011  . Ex-smoker 08/05/2011  . Hx of CABG 08/05/2011    Past Surgical History:  Procedure Laterality Date  . ABDOMINAL HYSTERECTOMY  1970   heavy bleeding  . APPENDECTOMY  1960s  . CARDIAC CATHETERIZATION  08/09/2011   no sign. coronary obstruction. LIMA is atretic w/excellent flow down the native LAD  . CARDIAC CATHETERIZATION  08/22/2001   no sign. coronary stenosis,  . CATARACT EXTRACTION, BILATERAL Bilateral   . CESAREAN SECTION    . CORONARY ARTERY BYPASS GRAFT  02-16-13   3'03-no bypasses -Had 2 aneurysm repairs(stents used)  . ESOPHAGOGASTRODUODENOSCOPY  04/2018   LA Grade A esophagitis, small nodule distal esophagus (benign schwann cell hamartoma), empiric dilation to 79m ([place])  . LAPAROTOMY N/A 02/20/2013   Procedure: EXPLORATORY LAPAROTOMY;  WJanie Morning MD  . LEFT HEART CATHETERIZATION WITH CORONARY ANGIOGRAM N/A 08/09/2011   Procedure: LEFT HEART CATHETERIZATION WITH CORONARY ANGIOGRAM;  Surgeon: MSanda Klein MD;  [facility];  Service: Cardiovascular;  Laterality: N/A;  . NM MYOCAR PERF WALL MOTION  07/20/2010   normal  . RADIOLOGY WITH ANESTHESIA N/A 02/22/2019   Procedure: MRI OF ABDOMEN WITH AND WITHOUT CONTRAST;  Surgeon: Radiologist, Medication, MD;  [facility]  Service: Radiology;  Laterality: N/A;  . SALPINGOOPHORECTOMY Bilateral 02/20/2013   benign seromucinous cystadenofibroma R ovary ([name] MD)  . TOOTH EXTRACTION    . TOTAL HIP ARTHROPLASTY Left 2015     OB History   No obstetric history on file.      Home Medications     Prior to Admission medications   Medication Sig Start Date End Date Taking? Authorizing Provider  albuterol (PROVENTIL) (2.5 MG/3ML) 0.083% nebulizer solution Take 3 mLs (2.5 mg total) by nebulization every 6 (six) hours as needed for wheezing or shortness of breath. 12/13/17  Yes GRia Bush MD  Ascorbic Acid (VITAMIN C WITH ROSE HIPS) 1000 MG tablet Take 1,000 mg by mouth daily.   Yes [provider]  aspirin EC 81 MG tablet Take 81 mg by mouth daily.   Yes [provider]  canagliflozin (INVOKANA) 100 MG TABS tablet Take 1 tablet (100 mg total) by mouth daily before breakfast. 03/05/19  Yes GRia Bush MD  carboxymethylcellulose (REFRESH PLUS) 0.5 % SOLN Place 1 drop into both eyes 2 (two) times daily as needed (for irritation).    Yes [provider]  Cholecalciferol (VITAMIN D) 2000 units CAPS Take 1 capsule (2,000 Units total) by mouth daily. Patient taking differently: Take 2,000 Units by mouth daily.  04/13/17  Yes GRia Bush MD  furosemide (LASIX) 40 MG tablet Take 120 mg (3 tablets) once daily for a weight of 135 pounds and over and take 80 mg (2 tablets) once daily for a weight of 134 and below. Patient taking differently: Take 80-120 mg  by mouth See admin instructions. Take 120 mg (3 tablets) once daily for a weight of 135 pounds and over and take 80 mg (2 tablets) once daily for a weight of 134 and below. 02/14/19  Yes Croitoru, Mihai, MD  LORazepam (ATIVAN) 0.5 MG tablet TAKE 1 TABLET BY MOUTH DAILY AT BEDTIME AS NEEDED FOR ANXIETY OR SLEEP MAY TAKE SECOND 1/2 TABLET AS NEEDED Patient taking differently: Take 0.5 mg by mouth at bedtime as needed for anxiety or sleep (may take a second 1/2 tablet as needed).  04/15/19  Yes Tonia Ghent, MD  meloxicam (MOBIC) 7.5 MG tablet Take 7.5 mg by mouth daily as needed for pain.  03/26/19  Yes [provider]  metFORMIN (GLUCOPHAGE-XR) 500 MG 24 hr tablet Take 2 tablets (1,000 mg total) by  mouth daily with breakfast. 03/05/19  Yes Ria Bush, MD  morphine (MS CONTIN) 15 MG 12 hr tablet Take 15 mg by mouth See admin instructions. Take 15 mg by mouth two times a day- 10 AM and 6 PM AND may take 1 tablet (57m) every 6 hours as needed. 06/15/17  Yes [provider]  polyethylene glycol (MIRALAX / GLYCOLAX) 17 g packet Take 17 g by mouth daily as needed. Patient taking differently: Take 17 g by mouth every morning.  12/23/18  Yes NMariel Aloe MD  sodium chloride (OCEAN) 0.65 % SOLN nasal spray Place 1 spray into both nostrils as needed for congestion.   Yes [provider]  ACCU-CHEK FASTCLIX LANCETS MISC 1 each by Does not apply route daily. Use as directed to check blood sugar once daily.  Dx code:  E11.65 05/02/18   GRia Bush MD  Alcohol Swabs (B-D SINGLE USE SWABS REGULAR) PADS 1 each by Does not apply route daily. Use as directed to check blood sugar once daily.  Dx code:  E11.65 05/02/18   GRia Bush MD  Blood Glucose Monitoring Suppl (ACCU-CHEK AVIVA PLUS) w/Device KIT 1 each by Does not apply route daily. Use as directed to check blood sugar once daily.  Dx code:  E11.65 05/02/18   GRia Bush MD  donepezil (ARICEPT) 5 MG tablet TAKE ONE TABLET BY MOUTH AT BEDTIME Patient not taking: No sig reported 08/15/17   GRia Bush MD  glucose blood (ACCU-CHEK AVIVA PLUS) test strip 1 each by Other route as needed for other. Use as instructed to check blood sugar once daily.  Dx code:  E11.65 05/02/18   GRia Bush MD  potassium chloride SA (K-DUR) 20 MEQ tablet Take 60 mEq (3 tablets) when you take the 120 mg of the Furosemide and take 40 mEq (2 tablets) when you take the 80 mg of the Furosemide Patient taking differently: Take 40-60 mEq by mouth See admin instructions. Take 60 mEq (3 tablets) when you take the 120 mg of the Furosemide and take 40 mEq (2 tablets) when you take the 80 mg of the Furosemide 02/14/19   Croitoru, Mihai, MD   senna-docusate (SENOKOT-S) 8.6-50 MG tablet Take 1 tablet by mouth at bedtime. Patient not taking: Reported on 04/29/2019 12/23/18   NMariel Aloe MD    Family History Family History  Problem Relation Age of Onset  . Stroke Mother   . Hypotension Mother   . Dementia Mother 642 . Coronary artery disease Father   . Hypertension Father   . Breast cancer Maternal Aunt   . Hypotension Sister   . Hypertension Brother   . Diabetes Brother   .  Lung cancer Daughter   . Thyroid cancer Daughter   . Colon polyps Maternal Grandfather   . Colon cancer Neg Hx   . Esophageal cancer Neg Hx   . Stomach cancer Neg Hx   . Rectal cancer Neg Hx     Social History Social History   Tobacco Use  . Smoking status: Former Smoker    Packs/day: 0.10    Years: 20.00    Pack years: 2.00    Types: Cigarettes    Quit date: 09/14/2017    Years since quitting: 1.6  . Smokeless tobacco: Never Used  . Tobacco comment: smoking cessation info given  Substance Use Topics  . Alcohol use: No  . Drug use: No     Allergies   Codeine, Penicillins, Prednisolone acetate, Prednisone, Meloxicam, Nitroglycerin, Metolazone, Adhesive [tape], Crestor [rosuvastatin calcium], Lopressor [metoprolol tartrate], Sulfamethoxazole, and Zetia [ezetimibe]   Review of Systems Review of Systems  Unable to perform ROS: Dementia  Constitutional: Positive for fever.  Respiratory: Negative for cough and shortness of breath.   Cardiovascular: Negative for chest pain.  Gastrointestinal: Positive for diarrhea. Negative for abdominal pain, nausea and vomiting.  Neurological: Positive for speech difficulty and weakness. Negative for syncope.     Physical Exam Updated Vital Signs BP 112/68 (BP Location: Right Arm)   Pulse (!) 120   Temp (!) 103.1 F (39.5 C) (Oral)   Resp 16   SpO2 96%   Physical Exam Vitals signs and nursing note reviewed.  Constitutional:      General: She is not in acute distress.    Appearance:  She is well-developed. She is not diaphoretic.  HENT:     Head: Normocephalic and atraumatic.     Mouth/Throat:     Mouth: Mucous membranes are dry.     Pharynx: Oropharynx is clear.  Eyes:     Conjunctiva/sclera: Conjunctivae normal.  Neck:     Musculoskeletal: Neck supple.  Cardiovascular:     Rate and Rhythm: Regular rhythm. Tachycardia present.     Pulses: Normal pulses.          Radial pulses are 2+ on the right side and 2+ on the left side.       Posterior tibial pulses are 2+ on the right side and 2+ on the left side.     Heart sounds: Normal heart sounds.     Comments: Tactile temperature in the extremities appropriate and equal bilaterally. Pulmonary:     Effort: Pulmonary effort is normal. No respiratory distress.     Breath sounds: Normal breath sounds.  Abdominal:     Palpations: Abdomen is soft.     Tenderness: There is no abdominal tenderness. There is no guarding.  Musculoskeletal:     Right lower leg: No edema.     Left lower leg: No edema.  Lymphadenopathy:     Cervical: No cervical adenopathy.  Skin:    General: Skin is warm and dry.  Neurological:     Mental Status: She is alert.     Comments: Patient is oriented to self, which daughter states is consistent with patient's normal baseline.  Patient will close her eyes and stop following commands in the middle of the interview, however, she is easily arousable to voice. Sensation grossly intact to light touch in the extremities. No noted speech deficits. No aphasia. Patient handles oral secretions without difficulty. No noted swallowing defects.  Equal grip strength bilaterally. No arm drift. Strength 5/5 in the upper extremities. Strength 5/5  in the lower extremities.  Coordination intact.  Cranial nerves III-XII grossly intact.  No noted visual field deficit. No facial droop.   Psychiatric:        Mood and Affect: Mood and affect normal.        Speech: Speech normal.        Behavior: Behavior normal.       ED Treatments / Results  Labs (all labs ordered are listed, but only abnormal results are displayed) Labs Reviewed  COMPREHENSIVE METABOLIC PANEL - Abnormal; Notable for the following components:      Result Value   Potassium 3.2 (*)    Glucose, Bld 219 (*)    GFR calc non Af Amer 56 (*)    All other components within normal limits  CBC WITH DIFFERENTIAL/PLATELET - Abnormal; Notable for the following components:   WBC 21.9 (*)    Neutro Abs 19.9 (*)    Lymphs Abs 0.6 (*)    Monocytes Absolute 1.2 (*)    Abs Immature Granulocytes 0.19 (*)    All other components within normal limits  URINALYSIS, ROUTINE W REFLEX MICROSCOPIC - Abnormal; Notable for the following components:   Glucose, UA >=500 (*)    All other components within normal limits  CULTURE, BLOOD (ROUTINE X 2)  CULTURE, BLOOD (ROUTINE X 2)  URINE CULTURE  SARS CORONAVIRUS 2 (TAT 6-24 HRS)  C DIFFICILE QUICK SCREEN W PCR REFLEX  GASTROINTESTINAL PANEL BY PCR, STOOL (REPLACES STOOL CULTURE)  LACTIC ACID, PLASMA  APTT  PROTIME-INR    EKG EKG Interpretation  Date/Time:  Sunday April 29 2019 10:19:33 EST Ventricular Rate:  119 PR Interval:  174 QRS Duration: 74 QT Interval:  314 QTC Calculation: 441 R Axis:   14 Text Interpretation: Sinus tachycardia Cannot rule out Anterior infarct , age undetermined Abnormal ECG Confirmed by Madalyn Rob [phone]) on 04/29/2019 2:50:15 PM  Radiology Dg Chest 2 View  Result Date: 04/29/2019 CLINICAL DATA:  Altered mental status. Fever. EXAM: CHEST - 2 VIEW COMPARISON:  12/20/2018 FINDINGS: Normal sized heart. Post CABG changes. Poor inspiration with a mild increase in size of a left diaphragmatic eventration no significant change in mild adjacent left basilar atelectasis or scarring. Otherwise, clear lungs with stable mildly prominent interstitial markings with normal vascularity. No pleural fluid. Thoracic spine degenerative changes and right shoulder loose bodies.  IMPRESSION: 1. No acute abnormality. 2. Stable mild chronic interstitial lung disease. 3. Mild increase in size of a left diaphragmatic eventration with stable mild adjacent left basilar atelectasis or scarring. Electronically Signed   By: Claudie Revering M.D.   On: 04/29/2019 11:37    Procedures Procedures (including critical care time)  Medications Ordered in ED Medications  sodium chloride flush (NS) 0.9 % injection 3 mL (has no administration in time range)  vancomycin (VANCOCIN) IVPB 1000 mg/200 mL premix (has no administration in time range)  aztreonam (AZACTAM) 1 g in sodium chloride 0.9 % 100 mL IVPB (has no administration in time range)  vancomycin (VANCOCIN) IVPB 750 mg/150 ml premix (has no administration in time range)  acetaminophen (TYLENOL) tablet 650 mg (650 mg Oral Given 04/29/19 1325)  sodium chloride 0.9 % bolus 1,000 mL (1,000 mLs Intravenous New Bag/Given 04/29/19 1130)  metroNIDAZOLE (FLAGYL) IVPB 500 mg (0 mg Intravenous Stopped 04/29/19 1448)  aztreonam (AZACTAM) 2 g in sodium chloride 0.9 % 100 mL IVPB (0 g Intravenous Stopped 04/29/19 1448)     Initial Impression / Assessment and Plan / ED Course  I  have reviewed the triage vital signs and the nursing notes.  Pertinent labs & imaging results that were available during my care of the patient were reviewed by me and considered in my medical decision making (see chart for details).  Clinical Course as of Apr 28 1456  Nancy Fetter Apr 29, 2019  1035 Patient not yet in the room.   [SJ]  [place] with Dr. Ouida Sills, Family Medicine resident. Agrees to come see the patient for admission.    [SJ]    Clinical Course User Index [SJ] Edmon Magid C, PA-C       Patient presents with fever and concern over altered mental status. Patient at her baseline mental status upon arrival here in the ED with no focal neuro deficits noted. She was febrile, tachycardic, and with leukocytosis of 21.9. Code sepsis activated early in the  patient's course. Patient intermittently confused, but this seems to improve with fever management and hydration. Chest x-ray and UA without evidence of infection. Patient admitted without known infectious source identified. COVID-19 test pending at time of admission.   Findings and plan of care discussed with Madalyn Rob, MD. Dr. Roslynn Amble personally evaluated and examined this patient.  Vitals:   04/29/19 1245 04/29/19 1300 04/29/19 1315 04/29/19 1330  BP: (!) 145/91 (!) 154/69 (!) 142/61 (!) 148/74  Pulse: 93 95 94 95  Resp: (!) 26 16 (!) 27 (!) 26  Temp:      TempSrc:      SpO2: 95% 98% 98% 97%  Weight:      Height:         Final Clinical Impressions(s) / ED Diagnoses   Final diagnoses:  Fever in adult    ED Discharge Orders    None       Layla Maw 04/29/19 1502    Lucrezia Starch, MD 05/01/19 2325

## 2019-04-30 ENCOUNTER — Encounter: Payer: Medicare HMO | Admitting: Family Medicine

## 2019-04-30 ENCOUNTER — Ambulatory Visit: Payer: Medicare HMO

## 2019-04-30 DIAGNOSIS — K861 Other chronic pancreatitis: Secondary | ICD-10-CM | POA: Diagnosis present

## 2019-04-30 DIAGNOSIS — I110 Hypertensive heart disease with heart failure: Secondary | ICD-10-CM | POA: Diagnosis present

## 2019-04-30 DIAGNOSIS — F05 Delirium due to known physiological condition: Secondary | ICD-10-CM | POA: Diagnosis present

## 2019-04-30 DIAGNOSIS — M1990 Unspecified osteoarthritis, unspecified site: Secondary | ICD-10-CM | POA: Diagnosis present

## 2019-04-30 DIAGNOSIS — J449 Chronic obstructive pulmonary disease, unspecified: Secondary | ICD-10-CM | POA: Diagnosis present

## 2019-04-30 DIAGNOSIS — F0390 Unspecified dementia without behavioral disturbance: Secondary | ICD-10-CM | POA: Diagnosis present

## 2019-04-30 DIAGNOSIS — E119 Type 2 diabetes mellitus without complications: Secondary | ICD-10-CM | POA: Diagnosis present

## 2019-04-30 DIAGNOSIS — I5032 Chronic diastolic (congestive) heart failure: Secondary | ICD-10-CM | POA: Diagnosis present

## 2019-04-30 DIAGNOSIS — I700 Atherosclerosis of aorta: Secondary | ICD-10-CM | POA: Diagnosis present

## 2019-04-30 DIAGNOSIS — E876 Hypokalemia: Secondary | ICD-10-CM | POA: Diagnosis present

## 2019-04-30 DIAGNOSIS — R509 Fever, unspecified: Secondary | ICD-10-CM | POA: Diagnosis present

## 2019-04-30 DIAGNOSIS — I672 Cerebral atherosclerosis: Secondary | ICD-10-CM | POA: Diagnosis present

## 2019-04-30 DIAGNOSIS — Z96642 Presence of left artificial hip joint: Secondary | ICD-10-CM | POA: Diagnosis present

## 2019-04-30 DIAGNOSIS — I25810 Atherosclerosis of coronary artery bypass graft(s) without angina pectoris: Secondary | ICD-10-CM | POA: Diagnosis present

## 2019-04-30 DIAGNOSIS — E875 Hyperkalemia: Secondary | ICD-10-CM | POA: Diagnosis not present

## 2019-04-30 DIAGNOSIS — Z7984 Long term (current) use of oral hypoglycemic drugs: Secondary | ICD-10-CM | POA: Diagnosis not present

## 2019-04-30 DIAGNOSIS — Z7982 Long term (current) use of aspirin: Secondary | ICD-10-CM | POA: Diagnosis not present

## 2019-04-30 DIAGNOSIS — E785 Hyperlipidemia, unspecified: Secondary | ICD-10-CM | POA: Diagnosis present

## 2019-04-30 DIAGNOSIS — E871 Hypo-osmolality and hyponatremia: Secondary | ICD-10-CM | POA: Diagnosis present

## 2019-04-30 DIAGNOSIS — E559 Vitamin D deficiency, unspecified: Secondary | ICD-10-CM | POA: Diagnosis present

## 2019-04-30 DIAGNOSIS — Z85820 Personal history of malignant melanoma of skin: Secondary | ICD-10-CM | POA: Diagnosis not present

## 2019-04-30 DIAGNOSIS — I2510 Atherosclerotic heart disease of native coronary artery without angina pectoris: Secondary | ICD-10-CM | POA: Diagnosis present

## 2019-04-30 DIAGNOSIS — R6510 Systemic inflammatory response syndrome (SIRS) of non-infectious origin without acute organ dysfunction: Secondary | ICD-10-CM | POA: Diagnosis present

## 2019-04-30 DIAGNOSIS — Z20828 Contact with and (suspected) exposure to other viral communicable diseases: Secondary | ICD-10-CM | POA: Diagnosis present

## 2019-04-30 DIAGNOSIS — E0590 Thyrotoxicosis, unspecified without thyrotoxic crisis or storm: Secondary | ICD-10-CM | POA: Diagnosis present

## 2019-04-30 LAB — GASTROINTESTINAL PANEL BY PCR, STOOL (REPLACES STOOL CULTURE)

## 2019-04-30 LAB — BASIC METABOLIC PANEL
Anion gap: 14 (ref 5–15)
BUN: 21 mg/dL (ref 8–23)
CO2: 20 mmol/L — ABNORMAL LOW (ref 22–32)
Calcium: 8.8 mg/dL — ABNORMAL LOW (ref 8.9–10.3)
Chloride: 109 mmol/L (ref 98–111)
Creatinine, Ser: 0.81 mg/dL (ref 0.44–1.00)
GFR calc Af Amer: >60 mL/min (ref >60)
GFR calc non Af Amer: >60 mL/min (ref >60)
Glucose, Bld: 128 mg/dL — ABNORMAL HIGH (ref 70–99)
Potassium: 3.3 mmol/L — ABNORMAL LOW (ref 3.5–5.1)
Sodium: 143 mmol/L (ref 135–145)

## 2019-04-30 LAB — CBC
HCT: 36.3 % (ref 36.0–46.0)
Hemoglobin: 11.5 g/dL — ABNORMAL LOW (ref 12.0–15.0)
MCH: 29.3 pg (ref 26.0–34.0)
MCHC: 31.7 g/dL (ref 30.0–36.0)
MCV: 92.6 fL (ref 80.0–100.0)
Platelets: 244 10*3/uL (ref 150–400)
RBC: 3.92 MIL/uL (ref 3.87–5.11)
RDW: 14.2 % (ref 11.5–15.5)
WBC: 13.1 10*3/uL — ABNORMAL HIGH (ref 4.0–10.5)
nRBC: 0 % (ref 0.0–0.2)

## 2019-04-30 LAB — URINE CULTURE: Culture: NO GROWTH

## 2019-04-30 LAB — GLUCOSE, CAPILLARY
Glucose-Capillary: 119 mg/dL — ABNORMAL HIGH (ref 70–99)
Glucose-Capillary: 191 mg/dL — ABNORMAL HIGH (ref 70–99)
Glucose-Capillary: 186 mg/dL — ABNORMAL HIGH (ref 70–99)
Glucose-Capillary: 171 mg/dL — ABNORMAL HIGH (ref 70–99)

## 2019-04-30 MED ORDER — POTASSIUM CHLORIDE 10 MEQ/100ML IV SOLN
10.0000 meq | INTRAVENOUS | Status: DC
  Administered 2019-04-30: 10 meq via INTRAVENOUS
  Filled 2019-04-30: qty 100

## 2019-04-30 MED ORDER — POTASSIUM CHLORIDE CRYS ER 20 MEQ PO TBCR
40.0000 meq | EXTENDED_RELEASE_TABLET | Freq: Two times a day (BID) | ORAL | Status: DC
  Administered 2019-04-30 – 2019-05-01 (×3): 40 meq via ORAL
  Filled 2019-04-30 (×3): qty 2

## 2019-04-30 MED ORDER — POTASSIUM CHLORIDE 10 MEQ/100ML IV SOLN: 10.0000 meq | INTRAVENOUS | Status: DC

## 2019-04-30 NOTE — Progress Notes (Signed)
Family Medicine Teaching Service Daily Progress Note Intern Pager: [phone]  Patient name: Meredith Gutierrez record number: [id number] Date of birth: 01-13-1941 Age: 78 y.o. Gender: female  Primary Care Provider: Ria Bush, MD Consultants: None Code Status: Full  Pt Overview and Major Events to Date:  04/29/19- admitted, Vanc, Flagyl, Aztreonam  Assessment and Plan: Meredith Gutierrez is a 78 y.o. female presenting with fever, urinary incontinence and lethargy, found to be meeting SIRS criteria. PMH is significant for chronic pancreatitis, UTIs, COPD, T2DM, CAD s/p CABG (March 2020), History of melanoma, dementia, Fibromuscular dysplasia of Renal Artery, dyslipidemia, and HTN.  SIRS:  Fever to 100.7*F at 8 PM last night, afebrile since, VSS. Leukocytosis decreasing from 21.9 to 13.1. More awake and alert today, still AOx1. Plan for ID pharmacy to do PCN skin testing to address PCN allergy and pick appropriate abx. Awaiting for results from blood culture before making further decisions regarding abx today. Most likely infection is repeat of bacteremia from 12/2018, GBS sensitive to Vanc/Cephalosporins, treated with linezolid. Will wait for results of skin patch testing. Can consider CT chest and TEE, as TEE was recommended in July 2020 and not obtained, vegetations not fully ruled out. - F/U blood culture, urine culture, GI panel - CBC, BMP - Continuous pulse ox - NPO until bedside swallow evaluation, then diabetic diet - Incentive spirometry - Lovenox for DVT ppx - Will continue abx treatment this afternoon when more information becomes available  HFpEF: Weight today 139 lbs, Lasix 120 mg, K repletion 60 mEq. UOP charted yesterday as 300cc, pt was in ED for significant period of time, expect UOP greater than that and not charted. - Lasix 120mg , K 60 mEq, adjust daily based on weight - Very judicious fluid resuscitation, no mIVF at this time - Daily weight, goal ~135lbs -  Strict I/Os  Hypokalemia Patient has chronic hypokalemia and home medications include 60 mEq K-Dur when using 120 mg Lasix, and 40 mEq when using 80 mg Lasix.  - Daily BMP, replete as needed - 60 mEq today  CAD s/p CABG:  - Continue home ASA 81 mg daily  HTN:  Patient has history of HTN, currently not on any medications except Lasix. Most recent BP 134/69 and ranging from normotensive to hypertensive. Removed from candesartan recently due to hyperkalemia. Cannot tolerate BB due to bradycardia. - Continue to monitor daily  T2DM:  BG 168-219, received 1U Aspart so far. Will continue to watch for 24 hours and add in other medications as appropriate. - Hold home medications - sSSI  - BG checks BID  Dementia:  AOx1 this morning, mildly more attentive.  - Delirium precautions - Ativan 0.5 mg as needed for sleep, may take a second 1/2 tab if needed  COPD:  Long time smoking history. No recent flares. Pt is stable on RA, satting well. No complaint of cough or SOB. Home medications are albuterol nebulizer PRN. She has not needed this in some time. - Hold home medications  FEN/GI: NPO until bedside swallow study, then diabetic diet Prophylaxis: Lovenox  Disposition: med-surg pending medical work up, improvement of fever  Subjective:  Patient's daughter present, says that she is doing a little bit better, more alert.   Objective: Temp:  [98.5 F (36.9 C)-103.1 F (39.5 C)] 98.5 F (36.9 C) (11/16 0619) Pulse Rate:  [76-120] 76 (11/16 0619) Resp:  [14-27] 18 (11/16 0619) BP: (106-154)/(59-91) 134/69 (11/16 0619) SpO2:  [93 %-100 %] 100 % (11/16 [id number]) Weight:  [  61 kg-62.2 kg] 62.2 kg (11/15 1808) Physical Exam: General: older woman, AOx1, NAD, waxing waning alertness Cardiovascular: RRR, no m/r/g Respiratory: CTAB, no increased WOB, crackles in bilateral lower lobes improved Abdomen: soft, NT, ND, normal bowel sounds Extremities: warm, dry, no LE  edema  Laboratory: Recent Labs  Lab 04/29/19 1032  WBC 21.9*  HGB 13.6  HCT 41.3  PLT 334   Recent Labs  Lab 04/23/19 0746 04/27/19 0951 04/29/19 1032  NA 139 142 139  K 5.3 No hemolysis seen* 5.2 3.2*  CL 102 102 102  CO2 29 24 22   BUN 23 22 20   CREATININE 0.89 0.86 0.96  CALCIUM 9.7 9.7 9.5  PROT 7.2  --  7.8  BILITOT 0.4  --  0.9  ALKPHOS 87  --  78  ALT 11  --  14  AST 14  --  16  GLUCOSE 168* 219* 219*    Imaging/Diagnostic Tests: Dg Chest 2 View  Result Date: 04/29/2019 CLINICAL DATA:  Altered mental status. Fever. EXAM: CHEST - 2 VIEW COMPARISON:  12/20/2018 FINDINGS: Normal sized heart. Post CABG changes. Poor inspiration with a mild increase in size of a left diaphragmatic eventration no significant change in mild adjacent left basilar atelectasis or scarring. Otherwise, clear lungs with stable mildly prominent interstitial markings with normal vascularity. No pleural fluid. Thoracic spine degenerative changes and right shoulder loose bodies. IMPRESSION: 1. No acute abnormality. 2. Stable mild chronic interstitial lung disease. 3. Mild increase in size of a left diaphragmatic eventration with stable mild adjacent left basilar atelectasis or scarring. Electronically Signed   By: Claudie Revering M.D.   On: 04/29/2019 11:37   Gladys Damme, MD 04/30/2019, 7:36 AM PGY-1, [facility] Intern pager: [phone], text pages welcome

## 2019-04-30 NOTE — Evaluation (Signed)
Clinical/Bedside Swallow Evaluation Patient Details  Name: Meredith Gutierrez MRN: [id number] Date of Birth: 01-May-1941  Today's Date: 04/30/2019 Time: SLP Start Time (ACUTE ONLY): [id number] SLP Stop Time (ACUTE ONLY): 0848 SLP Time Calculation (min) (ACUTE ONLY): 14 min  Past Medical History:  Past Medical History:  Diagnosis Date  . Angina    never needed to take nitroglycerin.  . Anxiety   . Blood transfusion without reported diagnosis   . Cerebral atherosclerosis   . COPD (chronic obstructive pulmonary disease) ([place])    02-16-13-"pt. denies this"-shows on CXR of 2- 2013.  Marland Kitchen Coronary artery disease 2003   s/p CABG 2014  . Dementia ([place])   . Diabetes mellitus without complication ([place]) [id number]   NEW ONSET  . Diastolic CHF, on admit treated. 08/10/2011  . Family history of adverse reaction to anesthesia    Dad and Sister had trouble waking up  . Fibromuscular dysplasia of renal artery ([facility])    right  . Heart murmur   . History of kidney stones 02-16-13   "hx. of overgrowth of muscle-causing some stricture"renal artery stenosis-being followed [place]  . Hyperlipidemia   . Hypertension   . Hyperthyroidism    [place]  . Hyponatremia 04/2018  . Melanoma ([place]) 2010   skin cancers, 1 melanoma removed back  . Osteoarthritis    Past Surgical History:  Past Surgical History:  Procedure Laterality Date  . ABDOMINAL HYSTERECTOMY  1970   heavy bleeding  . APPENDECTOMY  1960s  . CARDIAC CATHETERIZATION  08/09/2011   no sign. coronary obstruction. LIMA is atretic w/excellent flow down the native LAD  . CARDIAC CATHETERIZATION  08/22/2001   no sign. coronary stenosis,  . CATARACT EXTRACTION, BILATERAL Bilateral   . CESAREAN SECTION    . CORONARY ARTERY BYPASS GRAFT  02-16-13   3'03-no bypasses -Had 2 aneurysm repairs(stents used)  . ESOPHAGOGASTRODUODENOSCOPY  04/2018   LA Grade A esophagitis, small nodule distal esophagus (benign schwann cell hamartoma), empiric dilation to 24mm ([facility])  .  LAPAROTOMY N/A 02/20/2013   Procedure: EXPLORATORY LAPAROTOMY;  [name], MD  . LEFT HEART CATHETERIZATION WITH CORONARY ANGIOGRAM N/A 08/09/2011   Procedure: LEFT HEART CATHETERIZATION WITH CORONARY ANGIOGRAM;  Surgeon: Sanda Klein, MD;  Location: [facility] CATH [facility];  Service: Cardiovascular;  Laterality: N/A;  . NM MYOCAR PERF WALL MOTION  07/20/2010   normal  . RADIOLOGY WITH ANESTHESIA N/A 02/22/2019   Procedure: MRI OF ABDOMEN WITH AND WITHOUT CONTRAST;  Surgeon: Radiologist, Medication, MD;  Location: [facility];  Service: Radiology;  Laterality: N/A;  . SALPINGOOPHORECTOMY Bilateral 02/20/2013   benign seromucinous cystadenofibroma R ovary [name], MD)  . TOOTH EXTRACTION    . TOTAL HIP ARTHROPLASTY Left 2015   HPI:  [name] is a 78 y.o. female presenting with fever, urinary incontinence and lethargy, found to be meeting SIRS criteria. PMH is significant for chronic pancreatitis, UTIs, COPD, T2DM, CAD s/p CABG (March 2020), History of melanoma, dementia, Fibromuscular dysplasia of Renal Artery, dyslipidemia, and HTN.  Pt's daughter reported that the pt underwent an esophageal dilation a few years ago.     Assessment / Plan / Recommendation Clinical Impression  Pt presents with oral dysphagia likely secondary to cognition and dentition, and suspected functional pharyngeal phase of the swallow.  Pt was encountered asleep in bed with daughter present at bedside.  She was lethargic throughout this evaluation; however, she exhibited good participation given moderate verbal and tactile cues to maintain alertness.  Pt's daughter reported hx of  reduced esophageal peristalsis and stated that the pt underwent an esophageal dilation a few years ago.  Daughter additionally reported that pt consumes soft solids at home secondary to dementia and dentition (dentures top, no dentition on bottom).  Pt was observed with trials of ice chips, thin liquid, puree, and soft solids.  She exhibited good bolus  acceptance and timely AP transport with all trials.  She presented with mildly prolonged, but effective mastication of soft solids and she cleared trace residue with an independent liquid wash.  Pt exhibited eructation following 1/8 thin liquid via straw sip trials; however, no clinical s/sx of aspiration were observed with any trials despite challenging.  Recommend initiation of a Dysphagia 3 (mech soft) and thin liquid diet with the following precautions: 1) Small bites/sips 2) Slow rate of intake 3) Sit upright 90 degrees 4) Remain upright for 20-30 minutes following po intake.  Pt will benefit from full supervision to cue for compensatory strategies.  SLP educated pt and daughter regarding recommendations and they verbalized understanding.  SLP will briefly follow up to monitor diet tolerance.    SLP Visit Diagnosis: Dysphagia, oral phase (R13.11)    Aspiration Risk  Mild aspiration risk    Diet Recommendation Dysphagia 3 (Mech soft);Thin liquid   Liquid Administration via: Cup;Straw Medication Administration: Whole meds with puree Supervision: Staff to assist with self feeding;Full supervision/cueing for compensatory strategies Compensations: Minimize environmental distractions;Slow rate;Small sips/bites Postural Changes: Seated upright at 90 degrees;Remain upright for at least 30 minutes after po intake    Other  Recommendations Oral Care Recommendations: Oral care BID   Follow up Recommendations 24 hour supervision/assistance      Frequency and Duration min 1 x/week  2 weeks       Prognosis Prognosis for Safe Diet Advancement: Good Barriers to Reach Goals: Cognitive deficits      Swallow Study   General HPI: [name] is a 78 y.o. female presenting with fever, urinary incontinence and lethargy, found to be meeting SIRS criteria. PMH is significant for chronic pancreatitis, UTIs, COPD, T2DM, CAD s/p CABG (March 2020), History of melanoma, dementia, Fibromuscular dysplasia  of Renal Artery, dyslipidemia, and HTN.  Pt's daughter reported that the pt underwent an esophageal dilation a few years ago.   Type of Study: Bedside Swallow Evaluation Previous Swallow Assessment: None  Diet Prior to this Study: NPO Temperature Spikes Noted: Yes Respiratory Status: Room air History of Recent Intubation: No Behavior/Cognition: Cooperative;Pleasant mood;Lethargic/Drowsy;Requires cueing Oral Cavity Assessment: Dry Oral Care Completed by SLP: No Oral Cavity - Dentition: Dentures, top(no dentition on bottom ) Vision: Functional for self-feeding Self-Feeding Abilities: Needs assist Patient Positioning: Upright in bed Baseline Vocal Quality: Low vocal intensity Volitional Swallow: Able to elicit    Oral/Motor/Sensory Function Overall Oral Motor/Sensory Function: Within functional limits   Ice Chips Ice chips: Within functional limits Presentation: Spoon   Thin Liquid Thin Liquid: Within functional limits Presentation: Cup;Spoon;Straw    Nectar Thick Nectar Thick Liquid: Not tested   Honey Thick Honey Thick Liquid: Not tested   Puree Puree: Within functional limits Presentation: Spoon   Solid     Solid: Impaired Presentation: Spoon Oral Phase Impairments: Reduced labial seal;Impaired mastication Oral Phase Functional Implications: Prolonged oral transit;Oral residue;Impaired mastication     Colin Mulders M.S., [id number] Acute Rehabilitation Services Office: [phone]  [place] 04/30/2019,9:46 AM

## 2019-04-30 NOTE — Progress Notes (Signed)
PT Cancellation Note  Patient Details Name: Meredith Gutierrez MRN: [id number] DOB: [birth year]   Cancelled Treatment:    Reason Eval/Treat Not Completed: Other (comment) attempted to work with patient, she was occupied with care by nursing staff. Will attempt to check back later if time/schedule allow.    Windell Norfolk, DPT, PN1   Supplemental Physical Therapist [facility]    Pager [phone] Acute Rehab Office [phone]

## 2019-05-01 ENCOUNTER — Telehealth: Payer: Self-pay | Admitting: *Deleted

## 2019-05-01 LAB — CBC WITH DIFFERENTIAL/PLATELET
Abs Immature Granulocytes: 0.05 10*3/uL (ref 0.00–0.07)
Basophils Absolute: 0 10*3/uL (ref 0.0–0.1)
Basophils Relative: 0 %
Eosinophils Absolute: 0 10*3/uL (ref 0.0–0.5)
Eosinophils Relative: 0 %
HCT: 34.4 % — ABNORMAL LOW (ref 36.0–46.0)
Hemoglobin: 11.4 g/dL — ABNORMAL LOW (ref 12.0–15.0)
Immature Granulocytes: 0 %
Lymphocytes Relative: 14 %
Lymphs Abs: 1.8 10*3/uL (ref 0.7–4.0)
MCH: 29.6 pg (ref 26.0–34.0)
MCHC: 33.1 g/dL (ref 30.0–36.0)
MCV: 89.4 fL (ref 80.0–100.0)
Monocytes Absolute: 1 10*3/uL (ref 0.1–1.0)
Monocytes Relative: 8 %
Neutro Abs: 9.7 10*3/uL — ABNORMAL HIGH (ref 1.7–7.7)
Neutrophils Relative %: 78 %
Platelets: 263 10*3/uL (ref 150–400)
RBC: 3.85 MIL/uL — ABNORMAL LOW (ref 3.87–5.11)
RDW: 13.7 % (ref 11.5–15.5)
WBC: 12.6 10*3/uL — ABNORMAL HIGH (ref 4.0–10.5)
nRBC: 0 % (ref 0.0–0.2)

## 2019-05-01 LAB — BASIC METABOLIC PANEL
Anion gap: 11 (ref 5–15)
BUN: 20 mg/dL (ref 8–23)
CO2: 21 mmol/L — ABNORMAL LOW (ref 22–32)
Calcium: 8.5 mg/dL — ABNORMAL LOW (ref 8.9–10.3)
Chloride: 102 mmol/L (ref 98–111)
Creatinine, Ser: 0.69 mg/dL (ref 0.44–1.00)
GFR calc Af Amer: >60 mL/min (ref >60)
GFR calc non Af Amer: >60 mL/min (ref >60)
Glucose, Bld: 141 mg/dL — ABNORMAL HIGH (ref 70–99)
Potassium: 4 mmol/L (ref 3.5–5.1)
Sodium: 134 mmol/L — ABNORMAL LOW (ref 135–145)

## 2019-05-01 LAB — GLUCOSE, CAPILLARY
Glucose-Capillary: 190 mg/dL — ABNORMAL HIGH (ref 70–99)
Glucose-Capillary: 164 mg/dL — ABNORMAL HIGH (ref 70–99)

## 2019-05-01 MED ORDER — PENICILLIN G IN SODIUM CHLORIDE 10,000 UNITS/ML VIAL FOR SKIN TEST (NO CHARGE)
1000.0000 [IU] | Freq: Once | INTRADERMAL | Status: AC
  Administered 2019-05-01: 1000 [IU] via INTRADERMAL
  Filled 2019-05-01: qty 1

## 2019-05-01 MED ORDER — PENICILLIN G IN SODIUM CHLORIDE 10,000 UNITS/ML VIAL FOR SKIN TEST
1000.0000 [IU] | Freq: Once | INTRADERMAL | Status: AC
  Administered 2019-05-01: 1000 [IU] via TOPICAL
  Filled 2019-05-01 (×2): qty 1

## 2019-05-01 MED ORDER — DIPHENHYDRAMINE HCL 50 MG/ML IJ SOLN: 25.0000 mg | Freq: Once | INTRAMUSCULAR | Status: DC | PRN

## 2019-05-01 MED ORDER — EPINEPHRINE 0.3 MG/0.3ML IJ SOAJ
0.3000 mg | Freq: Once | INTRAMUSCULAR | Status: DC | PRN
  Filled 2019-05-01 (×2): qty 0.6

## 2019-05-01 MED ORDER — CANAGLIFLOZIN 100 MG PO TABS: 100.0000 mg | ORAL_TABLET | Freq: Every day | ORAL | Status: DC

## 2019-05-01 MED ORDER — AMOXICILLIN 500 MG PO TABS: 500.0000 mg | ORAL_TABLET | Freq: Three times a day (TID) | ORAL | 0 refills | Status: AC

## 2019-05-01 MED ORDER — METFORMIN HCL ER 500 MG PO TB24: 1000.0000 mg | ORAL_TABLET | Freq: Every day | ORAL | Status: DC

## 2019-05-01 MED ORDER — BENZYLPENICILLOYL POLYLYSINE 0.25 ML ID SOLN (NO-CHARGE)
0.1000 mL | Freq: Once | INTRADERMAL | Status: AC
  Administered 2019-05-01: 0.1 mL via INTRADERMAL
  Filled 2019-05-01: qty 0.25

## 2019-05-01 MED ORDER — BENZYLPENICILLOYL POLYLYSINE 0.25 ML ID SOLN
0.0600 mL | Freq: Once | INTRADERMAL | Status: AC
  Administered 2019-05-01: 0.06 mL via TOPICAL
  Filled 2019-05-01 (×2): qty 0.25

## 2019-05-01 MED ORDER — AMOXICILLIN 500 MG PO CAPS
500.0000 mg | ORAL_CAPSULE | Freq: Once | ORAL | Status: AC
  Administered 2019-05-01: 500 mg via ORAL
  Filled 2019-05-01: qty 1

## 2019-05-01 MED ORDER — SODIUM CHLORIDE 0.9% FLUSH
0.1000 mL | Freq: Once | INTRAVENOUS | Status: AC
  Administered 2019-05-01: 0.1 mL via TOPICAL

## 2019-05-01 MED ORDER — HISTAMINE PHOSPHATE 2.75 MG/ML IJ SOLN
0.1000 mL | Freq: Once | INTRAMUSCULAR | Status: AC
  Administered 2019-05-01: 0.1 mL via TOPICAL
  Filled 2019-05-01: qty 0.1

## 2019-05-01 MED ORDER — FUROSEMIDE 80 MG PO TABS: 120.0000 mg | ORAL_TABLET | Freq: Every day | ORAL | Status: DC

## 2019-05-01 MED ORDER — MORPHINE SULFATE ER 15 MG PO TBCR: 15.0000 mg | EXTENDED_RELEASE_TABLET | ORAL | 0 refills | Status: DC

## 2019-05-01 MED ORDER — MORPHINE SULFATE ER 15 MG PO TBCR
15.0000 mg | EXTENDED_RELEASE_TABLET | Freq: Two times a day (BID) | ORAL | Status: DC
  Administered 2019-05-01: 13:00:00 15 mg via ORAL
  Filled 2019-05-01: qty 1

## 2019-05-01 MED ORDER — SODIUM CHLORIDE 0.9% FLUSH
0.1000 mL | Freq: Once | INTRAVENOUS | Status: AC
  Administered 2019-05-01: 0.1 mL via INTRADERMAL

## 2019-05-01 NOTE — Progress Notes (Signed)
SLP Cancellation Note  Patient Details Name: Meredith Gutierrez MRN: [id number] DOB: 08-31-1940   Cancelled treatment:       Reason Eval/Treat Not Completed: SLP screened, no needs identified, will sign off. Checked in with pt and daughter at bedside. Daughter is happy with the food texture and feels her mother is back to her baseline arousal and eating and drinking ability. No further SLP interventions needed at this time.   Herbie Baltimore, MA CCC-SLP  Acute Rehabilitation Services Pager [phone] Office [phone]   Lynann Beaver 05/01/2019, 3:39 PM

## 2019-05-01 NOTE — Evaluation (Signed)
Occupational Therapy Evaluation Patient Details Name: Meredith CASTRONOVO MRN: [id number] DOB: 31-Dec-1940 Today's Date: 05/01/2019    History of Present Illness 78 y.o. female presenting with fever, urinary incontinence and lethargy, found to be meeting SIRS criteria. PMH is significant for chronic pancreatitis, UTIs, COPD, T2DM, CAD s/p CABG (March 2020), History of melanoma, dementia, Fibromuscular dysplasia of Renal Artery, dyslipidemia, and HTN.  She is currently undergoing treatment for SIRS.   Clinical Impression   Pt currently min guard to min assist for functional transfers simulated to the bathroom as well as for selfcare tasks sit to stand.  Feel she will benefit from acute care OT to address these deficits in order for pt to return to supervision level for home.  Pt's daughter reports that the pt has 24 hour assist from her as well as hired nursing when she is at work.  Do not anticipate and post acute OT needs but will continue to follow while in acute care.     Follow Up Recommendations  No OT follow up    Equipment Recommendations  None recommended by OT       Precautions / Restrictions Precautions Precautions: Fall Precaution Comments: hx of mild dementia Restrictions Weight Bearing Restrictions: No      Mobility Bed Mobility Overal bed mobility: Needs Assistance Bed Mobility: Supine to Sit     Supine to sit: Supervision        Transfers Overall transfer level: Needs assistance Equipment used: None Transfers: Sit to/from Stand;Stand Pivot Transfers Sit to Stand: Min guard Stand pivot transfers: Min assist       General transfer comment: min assist hand held without assistive device    Balance Overall balance assessment: Needs assistance   Sitting balance-Leahy Scale: Good       Standing balance-Leahy Scale: Fair Standing balance comment: She was able to maintain static standing with min guard but needed min assist for functional mobility hand held  assist.                           ADL either performed or assessed with clinical judgement   ADL Overall ADL's : Needs assistance/impaired Eating/Feeding: Independent   Grooming: Supervision/safety Grooming Details (indicate cue type and reason): simulated Upper Body Bathing: Supervision/ safety;Sitting Upper Body Bathing Details (indicate cue type and reason): simulated Lower Body Bathing: Sit to/from stand;Min guard   Upper Body Dressing : Supervision/safety;Sitting   Lower Body Dressing: Min guard;Sit to/from stand   Toilet Transfer: Min guard;Ambulation   Toileting- Clothing Manipulation and Hygiene: Min guard;Sit to/from stand       Functional mobility during ADLs: Min guard General ADL Comments: Pt able to complete sit to stand with min guard and complete short distance mobility in the room from bed to the recliner with min assist hand held     Vision Baseline Vision/History: No visual deficits Patient Visual Report: No change from baseline Vision Assessment?: No apparent visual deficits            Pertinent Vitals/Pain Pain Assessment: No/denies pain     Hand Dominance Right   Extremity/Trunk Assessment Upper Extremity Assessment Upper Extremity Assessment: RUE deficits/detail RUE Deficits / Details: AROM shoulder flexion 0-120 degrees with strength overall 3+/5, all other joints 4/5 with noted arthritic changes in the digits.(LUE strength and AROM WFLS)   Lower Extremity Assessment Lower Extremity Assessment: Defer to PT evaluation   Cervical / Trunk Assessment Cervical / Trunk Assessment: Normal  Communication Communication Communication: No difficulties   Cognition Arousal/Alertness: Awake/alert Behavior During Therapy: Flat affect Overall Cognitive Status: History of cognitive impairments - at baseline                                                Home Living Family/patient expects to be discharged to:: Private  residence Living Arrangements: Children;Other (Comment)(hired nurse during the day) Available Help at Discharge: Family;Available 24 hours/day(hired nurse while daughter is at work) Type of Home: House Home Access: Stairs to enter [employer] of Steps: 1   Home Layout: Multi-level;Other (Comment)(pt stays in the basement apartment) Alternate Level Stairs-Number of Steps: 12 Alternate Level Stairs-Rails: Can reach both;Right;Left Bathroom Shower/Tub: Tub/shower unit   Bathroom Toilet: Handicapped height     Home Equipment: Shower seat;Grab bars - tub/shower;Wheelchair - manual;Cane - single point;Walker - 2 wheels;Walker - 4 wheels;Hand held shower head;Bedside commode   Additional Comments: Pt only used the cane and the wheelchair out in the community      Prior Functioning/Environment Level of Independence: Needs assistance    ADL's / Homemaking Assistance Needed: supervision for bathing and dressing with independence for toileting tasks            OT Problem List: Decreased strength;Impaired balance (sitting and/or standing)      OT Treatment/Interventions: Self-care/ADL training;Neuromuscular education;Balance training;Patient/family education;Therapeutic activities;DME and/or AE instruction    OT Goals(Current goals can be found in the care plan section) Acute Rehab OT Goals Patient Stated Goal: Daugher wants MDs to figure out what's going on and why this is happening. OT Goal Formulation: With patient/family Time For Goal Achievement: 05/15/19 Potential to Achieve Goals: Good  OT Frequency: Min 2X/week           Co-evaluation PT/OT/SLP Co-Evaluation/Treatment: Yes Reason for Co-Treatment: To address functional/ADL transfers   OT goals addressed during session: ADL's and self-care      AM-PAC OT "6 Clicks" Daily Activity     Outcome Measure Help from another person eating meals?: None Help from another person taking care of personal grooming?: A  Little Help from another person toileting, which includes using toliet, bedpan, or urinal?: A Little Help from another person bathing (including washing, rinsing, drying)?: A Little Help from another person to put on and taking off regular upper body clothing?: None Help from another person to put on and taking off regular lower body clothing?: A Little 6 Click Score: 20   End of Session Nurse Communication: Mobility status  Activity Tolerance: Patient tolerated treatment well Patient left: in chair;with call bell/phone within reach;with family/visitor present  OT Visit Diagnosis: Unsteadiness on feet (R26.81);Muscle weakness (generalized) (M62.81)                Time: [id number] OT Time Calculation (min): 28 min Charges:  OT General Charges $OT Visit: 1 Visit OT Evaluation $OT Eval Moderate Complexity: 1 Mod   Kaipo Ardis OTR/L 05/01/2019, 11:21 AM

## 2019-05-01 NOTE — Evaluation (Signed)
Physical Therapy Evaluation Patient Details Name: ARNETTA WUBBEN MRN: [id number] DOB: Nov 17, 1940 Today's Date: 05/01/2019   History of Present Illness  78 y.o. female presenting with fever, urinary incontinence and lethargy, found to be meeting SIRS criteria. PMH is significant for chronic pancreatitis, UTIs, COPD, T2DM, CAD s/p CABG (March 2020), History of melanoma, dementia, Fibromuscular dysplasia of Renal Artery, dyslipidemia, and HTN.  She is currently undergoing treatment for SIRS.  Clinical Impression  Pt presents with an overall decrease in functional mobility secondary to above. PTA, pt was supervision for all mobility secondary to hx of dementia and cognitive impairments, daughter and nurse providing supervision for safety. Educ on precautions, positioning, therex, and importance of mobility. Today, pt able to transfer to sitting EOB with supervision, ambulate throughout the unit without AD and min assist, ambulate into bathroom and perform toilet transfer with min assist . Pt would benefit from continued acute PT services to maximize functional mobility and independence prior to d/c home.     Follow Up Recommendations No PT follow up    Equipment Recommendations  None recommended by PT    Recommendations for Other Services       Precautions / Restrictions Precautions Precautions: Fall Precaution Comments: hx of mild dementia Restrictions Weight Bearing Restrictions: No      Mobility  Bed Mobility Overal bed mobility: Needs Assistance Bed Mobility: Supine to Sit     Supine to sit: Supervision     General bed mobility comments: supervision for bed mobility, cues for safety/techniques  Transfers Overall transfer level: Needs assistance Equipment used: None Transfers: Sit to/from Stand;Stand Pivot Transfers Sit to Stand: Min guard Stand pivot transfers: Min assist       General transfer comment: min assist hand held without assistive  device  Ambulation/Gait Ambulation/Gait assistance: Min assist Gait Distance (Feet): 150 Feet Assistive device: None Gait Pattern/deviations: Trunk flexed;Decreased step length - left;Decreased step length - right Gait velocity: decreased   General Gait Details: min assist for gait without an AD, cues for safety/awareness. Ambulated throughout the unit this session and then ambulated into bathroom to use toilet  Stairs            Wheelchair Mobility    Modified Rankin (Stroke Patients Only)       Balance Overall balance assessment: Needs assistance Sitting-balance support: Feet supported Sitting balance-Leahy Scale: Good       Standing balance-Leahy Scale: Fair Standing balance comment: She was able to maintain static standing with min guard but needed min assist for functional mobility hand held assist.                             Pertinent Vitals/Pain Pain Assessment: No/denies pain    Home Living Family/patient expects to be discharged to:: Private residence Living Arrangements: Children;Other (Comment)(hired nurse during the day) Available Help at Discharge: Family;Available 24 hours/day(hired nurse while daughter is at work) Type of Home: House Home Access: Stairs to enter   [employer] of Steps: 1 Home Layout: Multi-level;Other (Comment)(pt stays in basement apartment) Home Equipment: Shower seat;Grab bars - tub/shower;Wheelchair - manual;Cane - single point;Walker - 2 wheels;Walker - 4 wheels;Hand held shower head;Bedside commode Additional Comments: Pt only used the cane and the wheelchair out in the community    Prior Function Level of Independence: Needs assistance   Gait / Transfers Assistance Needed: supervision assist for gait and stair navigation  ADL's / Homemaking Assistance Needed: supervision for bathing and  dressing with independence for toileting tasks        Hand Dominance   Dominant Hand: Right     Extremity/Trunk Assessment   Upper Extremity Assessment Upper Extremity Assessment: Defer to OT evaluation RUE Deficits / Details: AROM shoulder flexion 0-120 degrees with strength overall 3+/5, all other joints 4/5 with noted arthritic changes in the digits.(LUE strength and AROM WFLS)    Lower Extremity Assessment Lower Extremity Assessment: Generalized weakness    Cervical / Trunk Assessment Cervical / Trunk Assessment: Normal  Communication   Communication: No difficulties  Cognition Arousal/Alertness: Awake/alert Behavior During Therapy: Flat affect Overall Cognitive Status: History of cognitive impairments - at baseline                                 General Comments: pt unable to recall current location, pt's daughter present and reports that pt is at baseline for cognitiion      General Comments      Exercises     Assessment/Plan    PT Assessment Patient needs continued PT services  PT Problem List Decreased strength;Decreased mobility;Decreased safety awareness;Decreased activity tolerance;Decreased cognition;Decreased knowledge of use of DME;Decreased balance       PT Treatment Interventions DME instruction;Therapeutic activities;Gait training;Therapeutic exercise;Patient/family education;Stair training;Balance training;Functional mobility training    PT Goals (Current goals can be found in the Care Plan section)  Acute Rehab PT Goals Patient Stated Goal: "have my mom try walking" -daughter PT Goal Formulation: With family Time For Goal Achievement: 05/15/19 Potential to Achieve Goals: Good    Frequency Min 3X/week   Barriers to discharge        Co-evaluation PT/OT/SLP Co-Evaluation/Treatment: Yes Reason for Co-Treatment: For patient/therapist safety;To address functional/ADL transfers;Necessary to address cognition/behavior during functional activity PT goals addressed during session: Mobility/safety with mobility;Balance;Proper use of  DME;Strengthening/ROM OT goals addressed during session: ADL's and self-care       AM-PAC PT "6 Clicks" Mobility  Outcome Measure Help needed turning from your back to your side while in a flat bed without using bedrails?: None Help needed moving from lying on your back to sitting on the side of a flat bed without using bedrails?: None Help needed moving to and from a bed to a chair (including a wheelchair)?: A Little Help needed standing up from a chair using your arms (e.g., wheelchair or bedside chair)?: A Little Help needed to walk in hospital room?: A Little Help needed climbing 3-5 steps with a railing? : A Little 6 Click Score: 20    End of Session Equipment Utilized During Treatment: Gait belt Activity Tolerance: Patient tolerated treatment well Patient left: in chair;with chair alarm set;with family/visitor present;with call bell/phone within reach Nurse Communication: Mobility status PT Visit Diagnosis: Unsteadiness on feet (R26.81);Muscle weakness (generalized) (M62.81)    Time: [id number] PT Time Calculation (min) (ACUTE ONLY): 34 min   Charges:   PT Evaluation $PT Eval Low Complexity: 1 Low          Netta Corrigan, PT, DPT, CSRS Acute Rehab Office [place] 05/01/2019, 12:07 PM

## 2019-05-01 NOTE — Telephone Encounter (Signed)
Left a message for the patient to call back.  Notes recorded by Sanda Klein, MD on 04/28/2019 at 7:52 PM EST  Potassium level remains is now in high normal range. Etiology remains unclear. Avoid K rich foods.  ------   Notes recorded by Sanda Klein, MD on 04/27/2019 at 4:57 PM EST  Other than higher glucose, labs are OK

## 2019-05-01 NOTE — Progress Notes (Signed)
Penicillin Skin Testing Assessment  Penicillin allergy skin testing completed on 05/01/2019  Penicillin allergy skin testing requested by: Family Medicine Teaching Service   Patient history of penicillin or beta lactam allergy: Ms. Hindes daughter reports that her mother had a reaction to penicillin in her 72's. She believes at the time it may have been some shortness of breath and a rash. Ms. Takach has not tried penicillin since that time.   Last use of antihistamine (or other drug affecting response to histamine): No antihistamine medications noted.     Test Date Product Scratch Width (mm) Intradermal #1 Width (mm) Intradermal #2 Width (mm) Results (Pos/Neg/Ambiguous)  11/17 PRE-PEN (undiluted) 0 mm 1 mm 1 mm Neg   11/17 Penicillin G (10,000 U/mL) 0 mm 1 mm 1 mm Neg   11/17 Dilutent Control 0 mm 1 mm      11/17 Histamine (1mg /mL) 5 mm           Criteria for positive prick skin test: Induration > 66mm greater than diluent control Criteria for positive intradermal skin test: Significant increase in size of original bleb with wheal diameter 28mm or more larger than diluent control; itching and flare are commonly present Criteria for negative intradermal skin test: No increase in size of original bleb and no reaction greater than control site Equivocal intradermal skin test: Wheal only slightly larger than initial injection bleb and control site, with or without erythematous flare OR duplicates are discordant. Control site: If wheal >2-3 mm after 20 min, repeat skin test to look for dermatographism     Interpretation: [x]  NEGATIVE for penicillin allergy []  POSITIVE for penicillin allergy       Ms. Esquivel penicillin skin test is negative for penicillin allergy. She took a dose of amoxicillin at 15:15 and will be monitored for 1 hour.    Jimmy Footman, PharmD, BCPS, BCIDP Infectious Diseases Clinical Pharmacist Phone: [phone] 05/01/2019 3:24 PM

## 2019-05-01 NOTE — Progress Notes (Signed)
Family Medicine Teaching Service Daily Progress Note Intern Pager: [phone]  Patient name: Meredith Gutierrez record number: [id number] Date of birth: [birth year] Age: 78 y.o. Gender: female  Primary Care Provider: Ria Bush, MD Consultants: None Code Status: Full  Pt Overview and Major Events to Date:  04/29/19- admitted, Vanc, Flagyl, Aztreonam  Assessment and Plan: Meredith Gutierrez is a 78 y.o. female presenting with fever, urinary incontinence and lethargy, found to be meeting SIRS criteria. PMH is significant for chronic pancreatitis, UTIs, COPD, T2DM, CAD s/p CABG (March 2020), History of melanoma, dementia, Fibromuscular dysplasia of Renal Artery, dyslipidemia, and HTN.  SIRS: resolved Pt continued on vanc, aztreonam overnight, afebrile and VSS. Will d/c aztreonam today, vanc pending skin patch testing. GI panel negative, urine culture NG x1d, blood culture negative < 24hr. Leukocytosis decreasing to 12.6. Much more awake and alert today, still AOx1. Plan for ID pharmacy to do PCN skin testing to address PCN allergy and pick appropriate abx. Blood and urine cultures NG x 1 day. Most likely infection is repeat of bacteremia from 12/2018, GBS sensitive to Vanc/Cephalosporins, treated with linezolid. Will wait for results of skin patch testing. TEE not indicated at this time, will recommend close follow up with PCP and cardiologist outpatient. Pending skin testing, will likely start amoxicillin for treatment as an outpatient. - CBC, BMP - Continuous pulse ox - Incentive spirometry - Lovenox for DVT ppx - Will continue abx treatment this afternoon after skin patch testing  Chronic back pain: According to daughter, pt has long history of low back pain. Her home dosing of MScontin is 15mg  12 hr tablet BID, with 1 tab (15mg ) q6hr prn. PDMP reveals 120 tabs of MScontin 15mg  IR picked up in the last month, 60 tabs of MScontin 15mg  ER picked up in the last month. These are prescribed  by PCP. This is a very large amount of morphine, which could represent tolerance, but is also concerning for over medication, misuse, and potential for overdose. Will note inpatient team's concerns in discharge summary. -Continue home medication at lower frequency, MScontin 15mg  ER BID  HFpEF: Weight today 138 lbs, Lasix 120 mg, K repletion 60 mEq. UOP charted yesterday as 1800cc. - Lasix 120mg , K 60 mEq, adjust daily based on weight - Very judicious fluid resuscitation, no mIVF at this time - Daily weight, goal ~135lbs - Strict I/Os  Hypokalemia Patient has chronic hypokalemia and home medications include 60 mEq K-Dur when using 120 mg Lasix, and 40 mEq when using 80 mg Lasix.  - Daily BMP, replete as needed - 60 mEq today  CAD s/p CABG:  - Continue home ASA 81 mg daily  HTN:  Patient has history of HTN, currently not on any medications except Lasix. Most recent BP 132/80 and ranging from normotensive to hypertensive. Removed from candesartan recently due to hyperkalemia. Cannot tolerate BB due to bradycardia. - Continue to monitor daily  T2DM:  BG ranged from 141-191 in last 24hours, pt received 2U aspart. Home meds are canagliflozin and metformin - Restart home medications - sSSI  - BG checks BID  Dementia:  AOx2 this morning, much more attentive.  - Delirium precautions - Ativan 0.5 mg as needed for sleep, may take a second 1/2 tab if needed  COPD:  Long time smoking history. No recent flares. Pt is stable on RA, satting well. No complaint of cough or SOB. Home medications are albuterol nebulizer PRN. She has not needed this in some time. - Hold home medications  FEN/GI: diabetic diet Prophylaxis: Lovenox  Disposition: med-surg pending skin patch testing  Subjective:  Patient's daughter present, says that she is doing much better, more alert, AOx2, no complaints today other than pain in back.   Objective: Temp:  [98.5 F (36.9 C)] 98.5 F (36.9 C) (11/17  0518) Pulse Rate:  [59-70] 59 (11/17 0518) Resp:  [18-20] 18 (11/17 0518) BP: (104-132)/(69-80) 132/80 (11/17 0518) SpO2:  [98 %-100 %] 98 % (11/17 0518) Weight:  [62.7 kg-63.3 kg] 62.7 kg (11/17 0500) Physical Exam: General: older woman, AOx1, NAD, waxing waning alertness Cardiovascular: RRR, no m/r/g Respiratory: CTAB, no increased WOB, crackles in bilateral lower lobes improved Abdomen: soft, NT, ND, normal bowel sounds Extremities: warm, dry, no LE edema  Laboratory: Recent Labs  Lab 04/29/19 1032 04/30/19 0713 05/01/19 0208  WBC 21.9* 13.1* 12.6*  HGB 13.6 11.5* 11.4*  HCT 41.3 36.3 34.4*  PLT 334 244 263   Recent Labs  Lab 04/29/19 1032 04/30/19 0713 05/01/19 0208  NA 139 143 134*  K 3.2* 3.3* 4.0  CL 102 109 102  CO2 22 20* 21*  BUN 20 21 20   CREATININE 0.96 0.81 0.69  CALCIUM 9.5 8.8* 8.5*  PROT 7.8  --   --   BILITOT 0.9  --   --   ALKPHOS 78  --   --   ALT 14  --   --   AST 16  --   --   GLUCOSE 219* 128* 141*    Imaging/Diagnostic Tests: No results found. Gladys Damme, MD 05/01/2019, 6:24 AM PGY-1, [facility] Intern pager: [phone], text pages welcome

## 2019-05-01 NOTE — Progress Notes (Signed)
Signed form for penicillin skin test

## 2019-05-01 NOTE — Discharge Summary (Addendum)
[facility] Discharge Summary  Patient name: Meredith Gutierrez record number: [id number] Date of birth: 1940-12-08 Age: 78 y.o. Gender: female Date of Admission: 04/29/2019  Date of Discharge: 05/01/2019 Admitting Physician: Kinnie Feil, MD  Primary Care Provider: Ria Bush, MD Consultants: None  Indication for Hospitalization: SIRS  Discharge Diagnoses/Problem List:  Chronic pancreatitis History of UTIs COPD Type 2 diabetes CAD status post CABG (March 2003) History of melanoma Dementia Fibromuscular dysplasia of renal artery (less than 60% stenosis) Dyslipidemia Hypertension  Disposition: To home  Discharge Condition: Stable and improved  Discharge Exam:  Physical Exam: General: older woman, AOx1, NAD, waxing waning alertness Cardiovascular: RRR, no m/r/g Respiratory: CTAB, no increased WOB, crackles in bilateral lower lobes diminished, but still present Abdomen: soft, NT, ND, normal bowel sounds Extremities: warm, dry, no LE edema  Brief Hospital Course:  Ms. Meredith Gutierrez was admitted on 04/29/2019 for meeting SIRS criteria with fever of 103*F, leukocytosis, diarrhea, and altered mental status. Blood cultures were negative after 2 days, however, unfortunately patient received antibiotics prior to collection of blood. GI panel was negative and urine culture also negative. CXR did not show any acute or infectious process, but "Stable mild chronic interstitial lung disease. Mild increase in size of a left diaphragmatic eventration with stable mild adjacent left basilar atelectasis or scarring." Pt was treated with aztreonam x2, flagyl x1, and vancomycin x2. Fever and white count resolved, patient regained normal level of alertness and returned to baseline. Although a source could not be definitively determined, pt had recent h/o GBS bacteremia in July 2020, and was treated with linezolid due to a previous reported history of  penicillin allergy. PCN skin patch testing and an oral challenge was performed, patient is negative for a penicillin allergy. Amoxicillin 500 mg TID x7 days was prescribed for presumed GBS bacteremia. Of note, during previous hospitalization in 12/2018 for GBS bacteremia, patient had a TTE echo negative for vegetations. Infectious disease did not recommend TEE at that time. Today, pt still does not meet indication for TEE and did not receive it during this admission.  Issues for Follow Up:  1. Remove allergy for PCN, patient passed skin patch testing and oral challenge. 2. No clear source for infection found during this admission. Patient has L hemidiaphragm eventration and lung scarring vs atelectasis in LLL, no evidence of current pneumonia. May consider more testing such as CT or referral to pulmonology as an outpatient if symptoms return. TEE not indicated on this admission, however, with history of bacteremia and SIRS at this admission without clear source, may consider TEE in the future. 3. Recommend reviewing and decreasing dosing amount and frequency of BEERS medications such as MS contin and lorazepam in an elderly patient with dementia. PDMP review shows extensive MS contin use including 120 MS contin 15 mg IR in the last 30 days as well as 60 MS contin 15 mg ER in the last 30 days.  Significant Procedures: none  Significant Labs and Imaging:  Recent Labs  Lab 04/29/19 1032 04/30/19 0713 05/01/19 0208  WBC 21.9* 13.1* 12.6*  HGB 13.6 11.5* 11.4*  HCT 41.3 36.3 34.4*  PLT 334 244 263   Recent Labs  Lab 04/27/19 0951 04/29/19 1032 04/30/19 0713 05/01/19 0208  NA 142 139 143 134*  K 5.2 3.2* 3.3* 4.0  CL 102 102 109 102  CO2 24 22 20* 21*  GLUCOSE 219* 219* 128* 141*  BUN 22 20 21 20   CREATININE 0.86  0.96 0.81 0.69  CALCIUM 9.7 9.5 8.8* 8.5*  ALKPHOS  --  78  --   --   AST  --  16  --   --   ALT  --  14  --   --   ALBUMIN  --  3.9  --   --     Results/Tests Pending at  Time of Discharge: none  Discharge Medications:  Allergies as of 05/01/2019      Reactions   Codeine Anaphylaxis, Shortness Of Breath   Prednisolone Acetate Shortness Of Breath, Nausea Only   Prednisone Hives, Shortness Of Breath, Swelling   Nitroglycerin Other (See Comments)   Reaction not recalled    Metolazone Other (See Comments)   Hypokalemia, hyponatremia   Adhesive [tape] Rash, Other (See Comments)   SKIN IS VERY SENSITIVE- TEARS AND BRUISES EASILY, also   Crestor [rosuvastatin Calcium] Other (See Comments)   Weakness with statins   Lopressor [metoprolol Tartrate] Other (See Comments)   Bradycardia   Sulfamethoxazole Hives, Swelling   Zetia [ezetimibe] Other (See Comments)   Weakness      Medication List    TAKE these medications   Accu-Chek Aviva Plus w/Device Kit 1 each by Does not apply route daily. Use as directed to check blood sugar once daily.  Dx code:  E11.65   Accu-Chek FastClix Lancets Misc 1 each by Does not apply route daily. Use as directed to check blood sugar once daily.  Dx code:  E11.65   albuterol (2.5 MG/3ML) 0.083% nebulizer solution Commonly known as: PROVENTIL Take 3 mLs (2.5 mg total) by nebulization every 6 (six) hours as needed for wheezing or shortness of breath.   amoxicillin 500 MG tablet Commonly known as: AMOXIL Take 1 tablet (500 mg total) by mouth 3 (three) times daily for 7 days.   aspirin EC 81 MG tablet Take 81 mg by mouth daily.   B-D SINGLE USE SWABS REGULAR Pads 1 each by Does not apply route daily. Use as directed to check blood sugar once daily.  Dx code:  E11.65   canagliflozin 100 MG Tabs tablet Commonly known as: INVOKANA Take 1 tablet (100 mg total) by mouth daily before breakfast.   carboxymethylcellulose 0.5 % Soln Commonly known as: REFRESH PLUS Place 1 drop into both eyes 2 (two) times daily as needed (for irritation).   furosemide 40 MG tablet Commonly known as: LASIX Take 120 mg (3 tablets) once daily  for a weight of 135 pounds and over and take 80 mg (2 tablets) once daily for a weight of 134 and below. What changed:   how much to take  how to take this  when to take this   glucose blood test strip Commonly known as: Accu-Chek Aviva Plus 1 each by Other route as needed for other. Use as instructed to check blood sugar once daily.  Dx code:  E11.65   LORazepam 0.5 MG tablet Commonly known as: ATIVAN TAKE 1 TABLET BY MOUTH DAILY AT BEDTIME AS NEEDED FOR ANXIETY OR SLEEP MAY TAKE SECOND 1/2 TABLET AS NEEDED What changed: See the new instructions.   meloxicam 7.5 MG tablet Commonly known as: MOBIC Take 7.5 mg by mouth daily as needed for pain.   metFORMIN 500 MG 24 hr tablet Commonly known as: GLUCOPHAGE-XR Take 2 tablets (1,000 mg total) by mouth daily with breakfast.   morphine 15 MG 12 hr tablet Commonly known as: MS CONTIN Take 1 tablet (15 mg total) by mouth See admin  instructions. What changed: additional instructions   polyethylene glycol 17 g packet Commonly known as: MIRALAX / GLYCOLAX Take 17 g by mouth daily as needed. What changed: when to take this   potassium chloride SA 20 MEQ tablet Commonly known as: KLOR-CON Take 60 mEq (3 tablets) when you take the 120 mg of the Furosemide and take 40 mEq (2 tablets) when you take the 80 mg of the Furosemide What changed:   how much to take  how to take this  when to take this   sodium chloride 0.65 % Soln nasal spray Commonly known as: OCEAN Place 1 spray into both nostrils as needed for congestion.   vitamin C with rose hips 1000 MG tablet Take 1,000 mg by mouth daily.   Vitamin D 50 MCG (2000 UT) Caps Take 1 capsule (2,000 Units total) by mouth daily.       Discharge Instructions: Please refer to Patient Instructions section of EMR for full details.  Patient was counseled important signs and symptoms that should prompt return to medical care, changes in medications, dietary instructions, activity  restrictions, and follow up appointments.   Follow-Up Appointments: Follow-up Information    Ria Bush, MD. Schedule an appointment as soon as possible for a visit.   Specialty: Family Medicine Contact information: [place] [address] [phone]        Sanda Klein, MD .   Specialty: Cardiology Contact information: [address] [place] [address] [phone]           Gladys Damme, MD 05/01/2019, 4:20 PM PGY-1, [facility] Upper-Level Resident Addendum I have independently interviewed and examined the patient. I have discussed the above with the original author and agree with their documentation. My edits for correction/addition/clarification are in blue. Please see also any attending notes.    Milus Banister, DO PGY-2, [facility] 05/01/2019 6:44 PM  FPTS Service pager: [phone] (text pages welcome through [facility])

## 2019-05-01 NOTE — Discharge Instructions (Signed)
It was a pleasure taking care of you while you were in the hospital!  While here, you were treated for an infection, we are not sure of the source of infection. In July you were treated for a presumed pneumonia and for a Group B strep infection of your blood. Most likely this a similar infection. Unfortunately we could not get accurate blood cultures. However, we were able to confirm that you are not allergic to penicillin.  1. Please take Amoxicillin 500 mg three times a day for 7 days.  2. We recommend you follow up with your PCP within a week.   3. If you have any similar symptoms: new onset weakness, fever >100.4*F, decreased attention, not oriented to self, urinary or stool incontinence (out of normal), please seek immediate medical care.  4. An invasive test like a transesophageal echocardiogram (to look for vegetations on the heart) is not recommended at this time. However, if another infection occurs, you may ask your PCP or cardiologist about this test as an outpatient as the indication may change in the future.  5. We caution against the use of drugs that can decrease the senses, especially in older people, and especially people with memory problems. Drugs like MS contin can increase the risk of delirium. We recommend discussing your dosing and amount of pain killers with your PCP.  Be Well,  Dr. Chauncey Reading

## 2019-05-02 ENCOUNTER — Telehealth: Payer: Self-pay

## 2019-05-02 NOTE — Telephone Encounter (Signed)
Transition Care Management Follow-up Telephone Call   Date discharged? 05/01/2019   How have you been since you were released from the hospital? Per daughter, [name], still a little weak but better. Eating better. No fever. Patient is doing pretty good.   Do you understand why you were in the hospital? Not really per daughter   Do you understand the discharge instructions? Not really per daughter   Where were you discharged to? Home with daughter   Items Reviewed:  Medications reviewed: yes  Allergies reviewed: yes  Dietary changes reviewed: yes  Referrals reviewed: yes   Functional Questionnaire:   Activities of Daily Living (ADLs):   She states they are independent in the following: Dressing-with slight assistance at time, toileting, hygiene, ambulation States they require assistance with the following: fixing food and medication, bathing.    Any transportation issues/concerns?: No.   Any patient concerns? Not at this time.   Confirmed importance and date/time of follow-up visits scheduled yes  Provider Appointment booked with Dr. Darnell Level on 05/09/2019  Confirmed with patient if condition begins to worsen call PCP or go to the ER.  Patient was given the office number and encouraged to call back with question or concerns.  : yes

## 2019-05-04 LAB — CULTURE, BLOOD (ROUTINE X 2)
Culture: NO GROWTH
Culture: NO GROWTH
Special Requests: ADEQUATE

## 2019-05-07 NOTE — Telephone Encounter (Signed)
The patient has an appointment on 05/09/2019. She has been hospitalized and had repeat labs drawn after these were resulted. Potassium was within normal range.

## 2019-05-07 NOTE — Progress Notes (Signed)
Cardiology Office Note   Date:  05/09/2019   ID:  Spenser, Cong 23-Jul-1940, MRN [id number]  PCP:  Ria Bush, MD  Cardiologist: Dr. Sallyanne Kuster CC: Hosptial follow up    History of Present Illness: Meredith Gutierrez is a 78 y.o. 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  Echocardiogram revealed hyperdynamic left ventricular systolic function with moderate LVH, chronic diastolic heart failure, hyperlipidemia intolerant to multiple statin therapy and lipid-lowering therapies to include rosuvastatin, simvastatin, Zetia).  She is also unable to take beta-blockers due to bradycardia.  Was last seen by Dr. Sallyanne Kuster on 02/14/2019 and had complaints of lower extremity edema.  On evaluation she was volume overloaded.  At the time of that visit her dry weight was "reset" to 130 pounds.  She was found to have New York Heart Association functional class II dyspnea,  however,  this was hard to assess as she was very sedentary.  She also was found to have significant neurological problems Lasix was increased 20 mg daily on the days that she weighs 135 pounds or greater, otherwise she was to continue 80 mg once a day.  Unfortunately the patient was hospitalized on 04/29/2019 in the setting of SIRS criteria, fever of 103, with leukocytosis, diarrhea, and altered mental status.  Chest x-ray was negative for pneumonia but was found to show stable mild chronic interstitial lung disease, mild increase in size of left diaphragmatic eventration with stable mild adjacent left basilar atelectasis or scarring.  She was negative for COVID-19 per labs.  She was treated with antibiotic therapy to include aztreonam and Flagyl along with vancomycin.  She was diagnosed with GBS bacteremia.  It was also recommended that she decrease the amount of  MS Contin and lorazepam due to her age and decline in cognitive status.  She comes today with her daughter feeling much better.  She still has significant dementia but her daughter states that she is eating and drinking okay, has no complaints of chest pain or significant dyspnea on exertion.  The patient does not remember recent hospitalization.  On discharge the patient's furosemide was increased to 120 mg daily.  They did not restart her potassium.  She also states that the her mother is not bouncing back from hospitalization as she did 6 months ago.  She is concerned that they never found the etiology of her infection.  Of note, a TEE was not completed as blood cultures were negative.   Past Medical History:  Diagnosis Date   Angina    never needed to take nitroglycerin.   Anxiety    Blood transfusion without reported diagnosis    Cerebral atherosclerosis    COPD (chronic obstructive pulmonary disease) ([place])    02-16-13-"pt. denies this"-shows on CXR of 2- 2013.   Coronary artery disease 2003   s/p CABG 2014   Dementia [facility])    Diabetes mellitus without complication ([place]) 98/3382   NEW ONSET   Diastolic CHF, on admit treated. 08/10/2011   Family history of adverse reaction to anesthesia    Dad and Sister had trouble waking up   Fibromuscular dysplasia of renal artery ([facility])    right   Heart murmur    History of kidney stones 02-16-13   "hx. of overgrowth of muscle-causing some stricture"renal artery stenosis-being followed [facility]  Hyperlipidemia    Hypertension    Hyperthyroidism    Balan   Hyponatremia 04/2018   Melanoma ([place]) 2010   skin cancers, 1 melanoma removed back   Osteoarthritis     Past Surgical History:  Procedure Laterality Date   ABDOMINAL HYSTERECTOMY  1970   heavy bleeding   APPENDECTOMY  1960s   CARDIAC CATHETERIZATION  08/09/2011   no sign. coronary obstruction. LIMA is atretic w/excellent flow down the native LAD   CARDIAC  CATHETERIZATION  08/22/2001   no sign. coronary stenosis,   CATARACT EXTRACTION, BILATERAL Bilateral    CESAREAN SECTION     CORONARY ARTERY BYPASS GRAFT  02-16-13   3'03-no bypasses -Had 2 aneurysm repairs(stents used)   ESOPHAGOGASTRODUODENOSCOPY  04/2018   LA Grade A esophagitis, small nodule distal esophagus (benign schwann cell hamartoma), empiric dilation to 77m ([place])   LAPAROTOMY N/A 02/20/2013   Procedure: EXPLORATORY LAPAROTOMY;  WJanie Morning MD   LEFT HEART CATHETERIZATION WITH CORONARY ANGIOGRAM N/A 08/09/2011   Procedure: LEFT HEART CATHETERIZATION WITH CORONARY ANGIOGRAM;  Surgeon: MSanda Klein MD;  [facility];  Service: Cardiovascular;  Laterality: N/A;   NM MYOCAR PERF WALL MOTION  07/20/2010   normal   RADIOLOGY WITH ANESTHESIA N/A 02/22/2019   Procedure: MRI OF ABDOMEN WITH AND WITHOUT CONTRAST;  Surgeon: Radiologist, Medication, MD;  [facility]  Service: Radiology;  Laterality: N/A;   SALPINGOOPHORECTOMY Bilateral 02/20/2013   benign seromucinous cystadenofibroma R ovary ([name] MD)   TOOTH EXTRACTION     TOTAL HIP ARTHROPLASTY Left 2015     Current Outpatient Medications  Medication Sig Dispense Refill   ACCU-CHEK FASTCLIX LANCETS MISC 1 each by Does not apply route daily. Use as directed to check blood sugar once daily.  Dx code:  E11.65 100 each 0   albuterol (PROVENTIL) (2.5 MG/3ML) 0.083% nebulizer solution Take 3 mLs (2.5 mg total) by nebulization every 6 (six) hours as needed for wheezing or shortness of breath. 75 mL 1   Alcohol Swabs (B-D SINGLE USE SWABS REGULAR) PADS 1 each by Does not apply route daily. Use as directed to check blood sugar once daily.  Dx code:  E11.65 100 each 0   Ascorbic Acid (VITAMIN C WITH ROSE HIPS) 1000 MG tablet Take 1,000 mg by mouth daily.     aspirin EC 81 MG tablet Take 81 mg by mouth daily.     Blood Glucose Monitoring Suppl (ACCU-CHEK AVIVA PLUS) w/Device KIT 1 each by Does not  apply route daily. Use as directed to check blood sugar once daily.  Dx code:  E11.65 1 kit 0   canagliflozin (INVOKANA) 100 MG TABS tablet Take 1 tablet (100 mg total) by mouth daily before breakfast. 30 tablet 6   carboxymethylcellulose (REFRESH PLUS) 0.5 % SOLN Place 1 drop into both eyes 2 (two) times daily as needed (for irritation).      Cholecalciferol (VITAMIN D) 2000 units CAPS Take 1 capsule (2,000 Units total) by mouth daily. 30 capsule    furosemide (LASIX) 40 MG tablet Take 120 mg (3 tablets) once daily for a weight of 135 pounds and over and take 80 mg (2 tablets) once daily for a weight of 134 and below. 90 tablet 5   glucose blood (ACCU-CHEK AVIVA PLUS) test strip 1 each by Other route as needed for other. Use as instructed to check blood sugar once daily.  Dx code:  E11.65 100 each 0   LORazepam (ATIVAN) 0.5 MG tablet TAKE  1 TABLET BY MOUTH DAILY AT BEDTIME AS NEEDED FOR ANXIETY OR SLEEP MAY TAKE SECOND 1/2 TABLET AS NEEDED 45 tablet 0   meloxicam (MOBIC) 7.5 MG tablet Take 7.5 mg by mouth daily as needed for pain.      metFORMIN (GLUCOPHAGE-XR) 500 MG 24 hr tablet Take 2 tablets (1,000 mg total) by mouth daily with breakfast. 180 tablet 1   morphine (MS CONTIN) 15 MG 12 hr tablet Take 1 tablet (15 mg total) by mouth See admin instructions. 30 tablet 0   polyethylene glycol (MIRALAX / GLYCOLAX) 17 g packet Take 17 g by mouth daily as needed. (Patient taking differently: Take 17 g by mouth every morning. ) 14 each 0   potassium chloride SA (KLOR-CON) 20 MEQ tablet Take 60 mEq (3 tablets) when you take the 120 mg of the Furosemide and take 40 mEq (2 tablets) when you take the 80 mg of the Furosemide 90 tablet 5   sodium chloride (OCEAN) 0.65 % SOLN nasal spray Place 1 spray into both nostrils as needed for congestion.     No current facility-administered medications for this visit.     Allergies:   Codeine, Prednisolone acetate, Prednisone, Nitroglycerin, Metolazone,  Adhesive [tape], Crestor [rosuvastatin calcium], Lopressor [metoprolol tartrate], Sulfamethoxazole, and Zetia [ezetimibe]    Social History:  The patient  reports that she quit smoking about 19 months ago. Her smoking use included cigarettes. She has a 2.00 pack-year smoking history. She has never used smokeless tobacco. She reports that she does not drink alcohol or use drugs.   Family History:  The patient's family history includes Breast cancer in her maternal aunt; Colon polyps in her maternal grandfather; Coronary artery disease in her father; Dementia (age of onset: 7) in her mother; Diabetes in her brother; Hypertension in her brother and father; Hypotension in her mother and sister; Lung cancer in her daughter; Stroke in her mother; Thyroid cancer in her daughter.    ROS: All other systems are reviewed and negative. Unless otherwise mentioned in H&P    PHYSICAL EXAM: VS:  BP 136/84    Pulse 79    Temp (!) 97.5 F (36.4 C)    Ht 5' (1.524 m)    Wt 132 lb 12.8 oz (60.2 kg)    SpO2 98%    BMI 25.94 kg/m  , BMI Body mass index is 25.94 kg/m. GEN: Well nourished, well developed, in no acute distress HEENT: normal Neck: no JVD, carotid bruits, or masses Cardiac: RRR; soft 1/6 systolic murmur heard best at the right sternal border, rubs, or gallops, 1-2+ ankle edema in the dependent position  Respiratory:  Clear to auscultation bilaterally, normal work of breathing GI: soft, nontender, nondistended, + BS MS: no deformity or atrophy Skin: warm and dry, no rash Neuro:  Strength and sensation are intact Psych: euthymic mood, full affect, does not answer very many questions.  Has no memory of recent hospitalization.   EKG: Not completed this office visit  Recent Labs: 04/23/2019: TSH 1.25 04/29/2019: ALT 14 05/01/2019: BUN 20; Creatinine, Ser 0.69; Hemoglobin 11.4; Platelets 263; Potassium 4.0; Sodium 134    Lipid Panel    Component Value Date/Time   CHOL 194 04/23/2019 0746    TRIG 189.0 (H) 04/23/2019 0746   HDL 33.20 (L) 04/23/2019 0746   CHOLHDL 6 04/23/2019 0746   VLDL 37.8 04/23/2019 0746   LDLCALC 123 (H) 04/23/2019 0746   LDLDIRECT 122.0 04/07/2018 0841      Wt Readings from Last  3 Encounters:  05/09/19 132 lb 12.8 oz (60.2 kg)  05/01/19 138 lb 3.7 oz (62.7 kg)  04/05/19 134 lb 8 oz (61 kg)      Other studies Reviewed: Echocardiogram 12/23/18 1. The left ventricle has normal systolic function with an ejection fraction of 60-65%. The cavity size was normal. There is mild concentric left ventricular hypertrophy. Left ventricular diastolic Doppler parameters are consistent with impaired  relaxation. Elevated left ventricular end-diastolic pressure The E/e' is 21. No evidence of left ventricular regional wall motion abnormalities.  2. The right ventricle has normal systolic function. The cavity was normal. There is no increase in right ventricular wall thickness. Right ventricular systolic pressure is normal with an estimated pressure of 28.1 mmHg.  3. Left atrial size was moderately dilated.  4. Right atrial size was moderately dilated.  5. Mild thickening of the mitral valve leaflet. Mild calcification of the mitral valve leaflet. There is mild mitral annular calcification present. No evidence of mitral valve stenosis.  6. Moderate thickening of the aortic valve. Moderate calcification of the aortic valve. No stenosis of the aortic valve.  7. The aortic root is normal in size and structure.  8. There is mild dilatation of the ascending aorta measuring 41 mm.  9. The inferior vena cava was dilated in size with >50% respiratory variability.   ASSESSMENT AND PLAN:  1.  Coronary artery disease: Status post CABG in 2003, with repeat cath in 2013, with atretic LIMA and patent LAD per cardiac catheterization.  She denies any chest pain, she has chronic dyspnea on exertion, but remains very sedentary.  She will continue on 81 mg of aspirin, she is not on  beta-blocker due to bradycardia.  2.  Hypertension: Blood pressure is well controlled currently.  She remains on diuretics and potassium supplement.  3.  New [place] heart association functional class II dyspnea: The patient remains very sedentary, there is no evidence of volume overload today.  She had increase of her furosemide to 120 mg once daily every a.m.  She has not had potassium replacement since returning home from the hospital.  She is advised to go ahead and take 60 mEq of potassium daily as directed.  She is due to see her PCP today for follow-up hospitalization evaluation.  Would recommend that a BM ET be drawn to evaluate her status.  4.  Hypercholesterolemia: Not a candidate for statin therapy or Zetia due to intolerances.  Most recent LDL at 123.  May need to consider evaluation for PCSK9 inhibition and referral to pharmacy.   Current medicines are reviewed at length with the patient today.    Labs/ tests ordered today include: None seeing PCP today for labs.  Phill Myron. West Pugh, ANP, AACC   05/09/2019 12:18 PM    [facility] [place] [address] Office [phone] Fax [phone]  Notice: This dictation was prepared with Dragon dictation along with smaller phrase technology. Any transcriptional errors that result from this process are unintentional and may not be corrected upon review.

## 2019-05-09 ENCOUNTER — Encounter: Payer: Self-pay | Admitting: Adult Health

## 2019-05-09 ENCOUNTER — Ambulatory Visit (INDEPENDENT_AMBULATORY_CARE_PROVIDER_SITE_OTHER): Payer: Medicare HMO | Admitting: Family Medicine

## 2019-05-09 ENCOUNTER — Other Ambulatory Visit: Payer: Self-pay

## 2019-05-09 ENCOUNTER — Ambulatory Visit (INDEPENDENT_AMBULATORY_CARE_PROVIDER_SITE_OTHER): Payer: Medicare HMO | Admitting: Adult Health

## 2019-05-09 ENCOUNTER — Encounter: Payer: Self-pay | Admitting: Family Medicine

## 2019-05-09 VITALS — BP 134/70 | HR 63 | Temp 98.1°F | Ht 60.0 in | Wt 133.1 lb

## 2019-05-09 VITALS — BP 136/84 | HR 79 | Temp 97.5°F | Ht 60.0 in | Wt 132.8 lb

## 2019-05-09 DIAGNOSIS — E118 Type 2 diabetes mellitus with unspecified complications: Secondary | ICD-10-CM | POA: Diagnosis not present

## 2019-05-09 DIAGNOSIS — R6510 Systemic inflammatory response syndrome (SIRS) of non-infectious origin without acute organ dysfunction: Secondary | ICD-10-CM

## 2019-05-09 DIAGNOSIS — I5032 Chronic diastolic (congestive) heart failure: Secondary | ICD-10-CM | POA: Diagnosis not present

## 2019-05-09 DIAGNOSIS — K861 Other chronic pancreatitis: Secondary | ICD-10-CM | POA: Diagnosis not present

## 2019-05-09 DIAGNOSIS — I2510 Atherosclerotic heart disease of native coronary artery without angina pectoris: Secondary | ICD-10-CM | POA: Diagnosis not present

## 2019-05-09 DIAGNOSIS — F0390 Unspecified dementia without behavioral disturbance: Secondary | ICD-10-CM | POA: Diagnosis not present

## 2019-05-09 DIAGNOSIS — E7800 Pure hypercholesterolemia, unspecified: Secondary | ICD-10-CM | POA: Diagnosis not present

## 2019-05-09 DIAGNOSIS — I10 Essential (primary) hypertension: Secondary | ICD-10-CM

## 2019-05-09 DIAGNOSIS — G8929 Other chronic pain: Secondary | ICD-10-CM

## 2019-05-09 DIAGNOSIS — M545 Low back pain: Secondary | ICD-10-CM

## 2019-05-09 DIAGNOSIS — Z951 Presence of aortocoronary bypass graft: Secondary | ICD-10-CM

## 2019-05-09 DIAGNOSIS — E1165 Type 2 diabetes mellitus with hyperglycemia: Secondary | ICD-10-CM

## 2019-05-09 DIAGNOSIS — IMO0002 Reserved for concepts with insufficient information to code with codable children: Secondary | ICD-10-CM

## 2019-05-09 LAB — BASIC METABOLIC PANEL
BUN: 22 mg/dL (ref 6–23)
CO2: 28 mEq/L (ref 19–32)
Calcium: 9.7 mg/dL (ref 8.4–10.5)
Chloride: 98 mEq/L (ref 96–112)
Creatinine, Ser: 0.73 mg/dL (ref 0.40–1.20)
GFR: 76.64 mL/min (ref >60.00)
Glucose, Bld: 165 mg/dL — ABNORMAL HIGH (ref 70–99)
Potassium: 5.9 mEq/L — ABNORMAL HIGH (ref 3.5–5.1)
Sodium: 135 mEq/L (ref 135–145)

## 2019-05-09 LAB — CBC WITH DIFFERENTIAL/PLATELET
Basophils Absolute: 0.1 10*3/uL (ref 0.0–0.1)
Basophils Relative: 0.6 % (ref 0.0–3.0)
Eosinophils Absolute: 0.1 10*3/uL (ref 0.0–0.7)
Eosinophils Relative: 1.1 % (ref 0.0–5.0)
HCT: 40.5 % (ref 36.0–46.0)
Hemoglobin: 13.2 g/dL (ref 12.0–15.0)
Lymphocytes Relative: 19.6 % (ref 12.0–46.0)
Lymphs Abs: 2.7 10*3/uL (ref 0.7–4.0)
MCHC: 32.5 g/dL (ref 30.0–36.0)
MCV: 89.3 fl (ref 78.0–100.0)
Monocytes Absolute: 1.2 10*3/uL — ABNORMAL HIGH (ref 0.1–1.0)
Monocytes Relative: 8.4 % (ref 3.0–12.0)
Neutro Abs: 9.6 10*3/uL — ABNORMAL HIGH (ref 1.4–7.7)
Neutrophils Relative %: 70.3 % (ref 43.0–77.0)
Platelets: 424 10*3/uL — ABNORMAL HIGH (ref 150.0–400.0)
RBC: 4.54 Mil/uL (ref 3.87–5.11)
RDW: 14.7 % (ref 11.5–15.5)
WBC: 13.7 10*3/uL — ABNORMAL HIGH (ref 4.0–10.5)

## 2019-05-09 MED ORDER — POTASSIUM CHLORIDE CRYS ER 20 MEQ PO TBCR: EXTENDED_RELEASE_TABLET | ORAL | 5 refills | Status: DC

## 2019-05-09 NOTE — Assessment & Plan Note (Addendum)
No longer on donepezil 5mg . ?stopped during recent hospitalization. Will need to verify with daughter.

## 2019-05-09 NOTE — Assessment & Plan Note (Addendum)
Improvement noted on invokana. Continue.

## 2019-05-09 NOTE — Patient Instructions (Addendum)
Labs today Touch base with Dr Maryjean Ka about morphine dosing.  I'm glad you're feeling better. Have a happy thanksgiving! Return in 3-4 months for physical

## 2019-05-09 NOTE — Assessment & Plan Note (Signed)
Appreciate cards care. New dry weight ~135lbs. Stable at this time. Check BMP, CBC today.

## 2019-05-09 NOTE — Assessment & Plan Note (Signed)
Reviewed pain management - she is on MS Contin 15mg  BID as well as MSIR 15mg  QID PRN - daughter states only taking MSIR intermittently, not even every day - but continues filling 120 tablets/month. Encouraged only fill what is needed and to touch base with Dr Maryjean Ka to help titrate meds as appropriate. Reviewed risks of ongoing narcotic use in setting of age and dementia.

## 2019-05-09 NOTE — Assessment & Plan Note (Signed)
Appreciate GI care. Plan was to rpt MRI in 6 months to ensure stability.

## 2019-05-09 NOTE — Progress Notes (Signed)
This visit was conducted in person.  BP 134/70 (BP Location: Left Arm, Patient Position: Sitting, Cuff Size: Normal)   Pulse 63   Temp 98.1 F (36.7 C) (Temporal)   Ht 5' (1.524 m)   Wt 133 lb 2 oz (60.4 kg)   SpO2 97%   BMI 26.00 kg/m    CC: hospital f/u visit Subjective:    Patient ID: Meredith Gutierrez, female    DOB: [birth year], 78 y.o.   MRN: [id number]  HPI: Meredith Gutierrez is a 78 y.o. female presenting on 05/09/2019 for Hospitalization Follow-up (Pt accompanied by daughter, Cherly [place], 97.7].)   Recent hospitalization earlier this month with SIRS - fever Tmax 103, elevated WBC, diarrhea, AMS. Treated with aztreonam and flagyl + vancomycin. Blood cultures were checked after antibiotic administration. GI pathogen panel and UCx were negative. Tested negative for Covid19. Was advised to decrease MS Contin and lorazepam. Had penicillin allergy testing - tested negative so penicillin was removed from allergy list. Discharged on amoxicillin 566m TID x 1 wk course for presumed GBS bacteremia given this was cause of prior hospitalization 12/2018. CXR revealed L hemidiaphragm eventration and LLL lung scarring - suggested pulm eval or TEE if recurrent symptom develop.   Hospital team mistakenly reported that chronic pain regimen was managed by PCP - this is actually managed by Dr HLovenia [facility]through pain clinic. Upon PDMP review, she takes scheduled MS contin 121mBID as well as MSIR 1564mID per month. Last saw Dr HarMaryjean Kaout 1 month ago. Discussed with pt/daughter - MS Contin scheduled, but sparing MS IR - 0-2 times a day.   Saw cardiology today, note reviewed - stable period now on lasix 120m83mily, with addition of potassium replacement at 60mE78mily. rec BMP check at our office visit. Considering pharm clinic eval for PCSK9 given statin intolerance. TCM visit not completed by cardiology. Planned cards f/u in 3 months.   Invokana has significantly helped sugar control. A1c 10.9 (12/2018)  --> 7.2%.   She lives in an apartment in the basement of her daughter's house.   Date of Admission: 04/29/2019                     Date of Discharge: 05/01/2019 TCM hosp f/u phone call completed 05/02/2019  Indication for Hospitalization: SIRS  Discharge Diagnoses/Problem List:  Chronic pancreatitis History of UTIs COPD Type 2 diabetes CAD status post CABG (March 2003) History of melanoma Dementia Fibromuscular dysplasia of renal artery (less than 60% stenosis) Dyslipidemia Hypertension  Disposition: To home  Discharge Condition: Stable and improved     Relevant past medical, surgical, family and social history reviewed and updated as indicated. Interim medical history since our last visit reviewed. Allergies and medications reviewed and updated. Outpatient Medications Prior to Visit  Medication Sig Dispense Refill  . ACCU-CHEK FASTCLIX LANCETS MISC 1 each by Does not apply route daily. Use as directed to check blood sugar once daily.  Dx code:  E11.65 100 each 0  . albuterol (PROVENTIL) (2.5 MG/3ML) 0.083% nebulizer solution Take 3 mLs (2.5 mg total) by nebulization every 6 (six) hours as needed for wheezing or shortness of breath. 75 mL 1  . Alcohol Swabs (B-D SINGLE USE SWABS REGULAR) PADS 1 each by Does not apply route daily. Use as directed to check blood sugar once daily.  Dx code:  E11.65 100 each 0  . Ascorbic Acid (VITAMIN C WITH ROSE HIPS) 1000 MG tablet Take 1,000 mg by mouth  daily.    . aspirin EC 81 MG tablet Take 81 mg by mouth daily.    . Blood Glucose Monitoring Suppl (ACCU-CHEK AVIVA PLUS) w/Device KIT 1 each by Does not apply route daily. Use as directed to check blood sugar once daily.  Dx code:  E11.65 1 kit 0  . canagliflozin (INVOKANA) 100 MG TABS tablet Take 1 tablet (100 mg total) by mouth daily before breakfast. 30 tablet 6  . carboxymethylcellulose (REFRESH PLUS) 0.5 % SOLN Place 1 drop into both eyes 2 (two) times daily as needed (for irritation).      . Cholecalciferol (VITAMIN D) 2000 units CAPS Take 1 capsule (2,000 Units total) by mouth daily. 30 capsule   . furosemide (LASIX) 40 MG tablet Take 120 mg (3 tablets) once daily for a weight of 135 pounds and over and take 80 mg (2 tablets) once daily for a weight of 134 and below. 90 tablet 5  . glucose blood (ACCU-CHEK AVIVA PLUS) test strip 1 each by Other route as needed for other. Use as instructed to check blood sugar once daily.  Dx code:  E11.65 100 each 0  . LORazepam (ATIVAN) 0.5 MG tablet TAKE 1 TABLET BY MOUTH DAILY AT BEDTIME AS NEEDED FOR ANXIETY OR SLEEP MAY TAKE SECOND 1/2 TABLET AS NEEDED 45 tablet 0  . meloxicam (MOBIC) 7.5 MG tablet Take 7.5 mg by mouth daily as needed for pain.     . metFORMIN (GLUCOPHAGE-XR) 500 MG 24 hr tablet Take 2 tablets (1,000 mg total) by mouth daily with breakfast. 180 tablet 1  . morphine (MS CONTIN) 15 MG 12 hr tablet Take 1 tablet (15 mg total) by mouth See admin instructions. 30 tablet 0  . polyethylene glycol (MIRALAX / GLYCOLAX) 17 g packet Take 17 g by mouth daily as needed. (Patient taking differently: Take 17 g by mouth every morning. ) 14 each 0  . potassium chloride SA (KLOR-CON) 20 MEQ tablet Take 60 mEq (3 tablets) when you take the 120 mg of the Furosemide and take 40 mEq (2 tablets) when you take the 80 mg of the Furosemide 90 tablet 5  . sodium chloride (OCEAN) 0.65 % SOLN nasal spray Place 1 spray into both nostrils as needed for congestion.     No facility-administered medications prior to visit.      Per HPI unless specifically indicated in ROS section below Review of Systems Objective:    BP 134/70 (BP Location: Left Arm, Patient Position: Sitting, Cuff Size: Normal)   Pulse 63   Temp 98.1 F (36.7 C) (Temporal)   Ht 5' (1.524 m)   Wt 133 lb 2 oz (60.4 kg)   SpO2 97%   BMI 26.00 kg/m   Wt Readings from Last 3 Encounters:  05/09/19 133 lb 2 oz (60.4 kg)  05/09/19 132 lb 12.8 oz (60.2 kg)  05/01/19 138 lb 3.7 oz (62.7  kg)    Physical Exam Vitals signs and nursing note reviewed.  Constitutional:      General: She is not in acute distress.    Appearance: Normal appearance. She is not Gutierrez-appearing.  HENT:     Mouth/Throat:     Mouth: Mucous membranes are moist.     Pharynx: Oropharynx is clear. No posterior oropharyngeal erythema.  Cardiovascular:     Rate and Rhythm: Normal rate and regular rhythm.     Pulses: Normal pulses.     Heart sounds: Normal heart sounds. No murmur.  Pulmonary:  Effort: Pulmonary effort is normal. No respiratory distress.     Breath sounds: Normal breath sounds. No wheezing, rhonchi or rales.  Abdominal:     General: Abdomen is flat. Bowel sounds are normal. There is no distension.     Palpations: Abdomen is soft.     Tenderness: There is no abdominal tenderness. There is no guarding or rebound.  Musculoskeletal:     Right lower leg: Edema (1+) present.     Left lower leg: Edema (1+) present.  Neurological:     Mental Status: She is alert.  Psychiatric:        Mood and Affect: Mood normal.        Behavior: Behavior normal.       Lab Results  Component Value Date   HGBA1C 7.2 (H) 04/23/2019    Assessment & Plan:  This visit occurred during the SARS-CoV-2 public health emergency.  Safety protocols were in place, including screening questions prior to the visit, additional usage of staff PPE, and extensive cleaning of exam room while observing appropriate contact time as indicated for disinfecting solutions.   Problem List Items Addressed This Visit    SIRS (systemic inflammatory response syndrome) ([place]) - Primary    Recent hospitalization without source found, discharged to complete 1 wk amoxicillin course. Symptoms seem resolved. Consider TEE if recurrence.       Diabetes mellitus type 2, uncontrolled, with complications (HCC)    Improvement noted on invokana. Continue.       Dementia ([place]) (Chronic)    No longer on donepezil 30m. ?stopped during recent  hospitalization. Will need to verify with daughter.       Chronic pancreatitis ([place]    Appreciate GI care. Plan was to rpt MRI in 6 months to ensure stability.       Chronic lower back pain    Reviewed pain management - she is on MS Contin 142mBID as well as MSIR 1590mID PRN - daughter states only taking MSIR intermittently, not even every day - but continues filling 120 tablets/month. Encouraged only fill what is needed and to touch base with Dr HarMaryjean Ka help titrate meds as appropriate. Reviewed risks of ongoing narcotic use in setting of age and dementia.       Chronic diastolic (congestive) heart failure (HCC) (Chronic)    Appreciate cards care. New dry weight ~135lbs. Stable at this time. Check BMP, CBC today.       Relevant Orders   Basic metabolic panel   CBC with Differential       No orders of the defined types were placed in this encounter.  Orders Placed This Encounter  Procedures  . Basic metabolic panel  . CBC with Differential   Patient Instructions  Labs today Touch base with Dr HarMaryjean Kaout morphine dosing.  I'm glad you're feeling better. Have a happy thanksgiving! Return in 3-4 months for physical    Follow up plan: Return in about 3 months (around 08/09/2019) for annual exam, prior fasting for blood work.  JavRia BushD

## 2019-05-09 NOTE — Assessment & Plan Note (Signed)
Recent hospitalization without source found, discharged to complete 1 wk amoxicillin course. Symptoms seem resolved. Consider TEE if recurrence.

## 2019-05-09 NOTE — Patient Instructions (Signed)
Medication Instructions:  Meredith Sims, DNP has recommended making the following medication changes: 1. RESTART Potassium at previous dose  *If you need a refill on your cardiac medications before your next appointment, please call your pharmacy*  Follow-Up: At [facility], you and your health needs are our priority.  As part of our continuing mission to provide you with exceptional heart care, we have created designated Provider Care Teams.  These Care Teams include your primary Cardiologist (physician) and Advanced Practice Providers (APPs -  Physician Assistants and Nurse Practitioners) who all work together to provide you with the care you need, when you need it.  Your next appointment:   3 month(s)  The format for your next appointment:   Virtual Visit   Provider:   You may see Sanda Klein, MD or one of the following Advanced Practice Providers on your designated Care Team:    Almyra Deforest, PA-C  Fabian Sharp, PA-C or   Roby Lofts, [place]

## 2019-05-11 ENCOUNTER — Other Ambulatory Visit: Payer: Self-pay | Admitting: Family Medicine

## 2019-05-11 DIAGNOSIS — E875 Hyperkalemia: Secondary | ICD-10-CM

## 2019-05-16 ENCOUNTER — Other Ambulatory Visit: Payer: Self-pay

## 2019-05-16 ENCOUNTER — Other Ambulatory Visit (INDEPENDENT_AMBULATORY_CARE_PROVIDER_SITE_OTHER): Payer: Medicare HMO

## 2019-05-16 DIAGNOSIS — E1165 Type 2 diabetes mellitus with hyperglycemia: Secondary | ICD-10-CM | POA: Diagnosis not present

## 2019-05-16 DIAGNOSIS — E875 Hyperkalemia: Secondary | ICD-10-CM

## 2019-05-16 DIAGNOSIS — E118 Type 2 diabetes mellitus with unspecified complications: Secondary | ICD-10-CM

## 2019-05-16 DIAGNOSIS — IMO0002 Reserved for concepts with insufficient information to code with codable children: Secondary | ICD-10-CM

## 2019-05-17 ENCOUNTER — Other Ambulatory Visit: Payer: Self-pay | Admitting: Family Medicine

## 2019-05-17 LAB — MICROALBUMIN / CREATININE URINE RATIO
Creatinine,U: 40 mg/dL
Microalb Creat Ratio: 1.7 mg/g (ref 0.0–30.0)
Microalb, Ur: <0.7 mg/dL (ref 0.0–1.9)

## 2019-05-17 LAB — POTASSIUM: Potassium: 4.4 mEq/L (ref 3.5–5.1)

## 2019-05-17 MED ORDER — POTASSIUM CHLORIDE CRYS ER 20 MEQ PO TBCR: 20.0000 meq | EXTENDED_RELEASE_TABLET | Freq: Every day | ORAL | 6 refills | Status: DC

## 2019-05-21 ENCOUNTER — Telehealth: Payer: Self-pay | Admitting: *Deleted

## 2019-05-21 NOTE — Telephone Encounter (Signed)
Patent's daughter [name] (on [place]) left a voicemail stating that she would like the lab results on her mom from last Thursday. [name] requested a call back.

## 2019-05-22 NOTE — Telephone Encounter (Signed)
Meredith Gutierrez aware of results.  [See Labs, 05/16/19.]

## 2019-05-31 ENCOUNTER — Other Ambulatory Visit: Payer: Self-pay | Admitting: Family Medicine

## 2019-05-31 DIAGNOSIS — E1169 Type 2 diabetes mellitus with other specified complication: Secondary | ICD-10-CM

## 2019-05-31 DIAGNOSIS — E0590 Thyrotoxicosis, unspecified without thyrotoxic crisis or storm: Secondary | ICD-10-CM

## 2019-05-31 DIAGNOSIS — E559 Vitamin D deficiency, unspecified: Secondary | ICD-10-CM

## 2019-05-31 DIAGNOSIS — E1165 Type 2 diabetes mellitus with hyperglycemia: Secondary | ICD-10-CM

## 2019-05-31 DIAGNOSIS — IMO0002 Reserved for concepts with insufficient information to code with codable children: Secondary | ICD-10-CM

## 2019-06-01 ENCOUNTER — Other Ambulatory Visit: Payer: Self-pay

## 2019-06-01 ENCOUNTER — Other Ambulatory Visit (INDEPENDENT_AMBULATORY_CARE_PROVIDER_SITE_OTHER): Payer: Medicare HMO

## 2019-06-01 DIAGNOSIS — IMO0002 Reserved for concepts with insufficient information to code with codable children: Secondary | ICD-10-CM

## 2019-06-01 DIAGNOSIS — E1165 Type 2 diabetes mellitus with hyperglycemia: Secondary | ICD-10-CM

## 2019-06-01 DIAGNOSIS — E118 Type 2 diabetes mellitus with unspecified complications: Secondary | ICD-10-CM | POA: Diagnosis not present

## 2019-06-01 LAB — BASIC METABOLIC PANEL
BUN: 22 mg/dL (ref 6–23)
CO2: 26 mEq/L (ref 19–32)
Calcium: 9.7 mg/dL (ref 8.4–10.5)
Chloride: 105 mEq/L (ref 96–112)
Creatinine, Ser: 0.66 mg/dL (ref 0.40–1.20)
GFR: 86.08 mL/min (ref >60.00)
Glucose, Bld: 177 mg/dL — ABNORMAL HIGH (ref 70–99)
Potassium: 4.9 mEq/L (ref 3.5–5.1)
Sodium: 136 mEq/L (ref 135–145)

## 2019-06-13 ENCOUNTER — Other Ambulatory Visit: Payer: Self-pay | Admitting: Family Medicine

## 2019-06-13 NOTE — Telephone Encounter (Signed)
Last filled on 04/15/2019 #45 with 0 refill. LOV 05/09/2019  Next appointment on 08/13/2019

## 2019-06-15 DIAGNOSIS — U071 COVID-19: Secondary | ICD-10-CM

## 2019-06-15 HISTORY — DX: COVID-19: U07.1

## 2019-06-18 NOTE — Telephone Encounter (Signed)
ERx 

## 2019-06-25 ENCOUNTER — Inpatient Hospital Stay (HOSPITAL_COMMUNITY)
Admission: EM | Admit: 2019-06-25 | Discharge: 2019-06-27 | DRG: 177 | Disposition: A | Discharge status: Home / Self Care | Payer: Medicare HMO | Attending: Internal Medicine | Admitting: Internal Medicine

## 2019-06-25 ENCOUNTER — Emergency Department (HOSPITAL_COMMUNITY): Payer: Medicare HMO

## 2019-06-25 DIAGNOSIS — U071 COVID-19: Principal | ICD-10-CM | POA: Diagnosis present

## 2019-06-25 DIAGNOSIS — Z79899 Other long term (current) drug therapy: Secondary | ICD-10-CM | POA: Diagnosis not present

## 2019-06-25 DIAGNOSIS — Z7984 Long term (current) use of oral hypoglycemic drugs: Secondary | ICD-10-CM

## 2019-06-25 DIAGNOSIS — I110 Hypertensive heart disease with heart failure: Secondary | ICD-10-CM | POA: Diagnosis present

## 2019-06-25 DIAGNOSIS — E872 Acidosis: Secondary | ICD-10-CM | POA: Diagnosis present

## 2019-06-25 DIAGNOSIS — G894 Chronic pain syndrome: Secondary | ICD-10-CM | POA: Diagnosis present

## 2019-06-25 DIAGNOSIS — E785 Hyperlipidemia, unspecified: Secondary | ICD-10-CM | POA: Diagnosis present

## 2019-06-25 DIAGNOSIS — F0280 Dementia in other diseases classified elsewhere without behavioral disturbance: Secondary | ICD-10-CM | POA: Diagnosis present

## 2019-06-25 DIAGNOSIS — Z888 Allergy status to other drugs, medicaments and biological substances status: Secondary | ICD-10-CM

## 2019-06-25 DIAGNOSIS — I82432 Acute embolism and thrombosis of left popliteal vein: Secondary | ICD-10-CM | POA: Diagnosis present

## 2019-06-25 DIAGNOSIS — G4700 Insomnia, unspecified: Secondary | ICD-10-CM | POA: Diagnosis present

## 2019-06-25 DIAGNOSIS — R410 Disorientation, unspecified: Secondary | ICD-10-CM

## 2019-06-25 DIAGNOSIS — G309 Alzheimer's disease, unspecified: Secondary | ICD-10-CM | POA: Diagnosis present

## 2019-06-25 DIAGNOSIS — M549 Dorsalgia, unspecified: Secondary | ICD-10-CM | POA: Diagnosis present

## 2019-06-25 DIAGNOSIS — Z79891 Long term (current) use of opiate analgesic: Secondary | ICD-10-CM

## 2019-06-25 DIAGNOSIS — Z882 Allergy status to sulfonamides status: Secondary | ICD-10-CM

## 2019-06-25 DIAGNOSIS — Z951 Presence of aortocoronary bypass graft: Secondary | ICD-10-CM | POA: Diagnosis not present

## 2019-06-25 DIAGNOSIS — Z885 Allergy status to narcotic agent status: Secondary | ICD-10-CM

## 2019-06-25 DIAGNOSIS — R630 Anorexia: Secondary | ICD-10-CM | POA: Diagnosis present

## 2019-06-25 DIAGNOSIS — G8929 Other chronic pain: Secondary | ICD-10-CM

## 2019-06-25 DIAGNOSIS — E1169 Type 2 diabetes mellitus with other specified complication: Secondary | ICD-10-CM | POA: Diagnosis present

## 2019-06-25 DIAGNOSIS — I10 Essential (primary) hypertension: Secondary | ICD-10-CM | POA: Diagnosis not present

## 2019-06-25 DIAGNOSIS — E119 Type 2 diabetes mellitus without complications: Secondary | ICD-10-CM | POA: Diagnosis present

## 2019-06-25 DIAGNOSIS — N390 Urinary tract infection, site not specified: Secondary | ICD-10-CM

## 2019-06-25 DIAGNOSIS — I5032 Chronic diastolic (congestive) heart failure: Secondary | ICD-10-CM | POA: Diagnosis present

## 2019-06-25 DIAGNOSIS — I2510 Atherosclerotic heart disease of native coronary artery without angina pectoris: Secondary | ICD-10-CM | POA: Diagnosis present

## 2019-06-25 DIAGNOSIS — J1282 Pneumonia due to coronavirus disease 2019: Secondary | ICD-10-CM | POA: Diagnosis present

## 2019-06-25 DIAGNOSIS — R509 Fever, unspecified: Secondary | ICD-10-CM | POA: Diagnosis not present

## 2019-06-25 DIAGNOSIS — E118 Type 2 diabetes mellitus with unspecified complications: Secondary | ICD-10-CM | POA: Diagnosis present

## 2019-06-25 DIAGNOSIS — M47816 Spondylosis without myelopathy or radiculopathy, lumbar region: Secondary | ICD-10-CM | POA: Diagnosis not present

## 2019-06-25 DIAGNOSIS — M7989 Other specified soft tissue disorders: Secondary | ICD-10-CM | POA: Diagnosis not present

## 2019-06-25 LAB — URINALYSIS, ROUTINE W REFLEX MICROSCOPIC
Bilirubin Urine: NEGATIVE
Glucose, UA: >=500 mg/dL — AB
Hgb urine dipstick: NEGATIVE
Ketones, ur: NEGATIVE mg/dL
Nitrite: NEGATIVE
Protein, ur: NEGATIVE mg/dL
Specific Gravity, Urine: 1.026 (ref 1.005–1.030)
pH: 5 (ref 5.0–8.0)

## 2019-06-25 LAB — COMPREHENSIVE METABOLIC PANEL
ALT: 34 U/L (ref 0–44)
AST: 34 U/L (ref 15–41)
Albumin: 2.9 g/dL — ABNORMAL LOW (ref 3.5–5.0)
Alkaline Phosphatase: 69 U/L (ref 38–126)
Anion gap: 10 (ref 5–15)
BUN: 14 mg/dL (ref 8–23)
CO2: 18 mmol/L — ABNORMAL LOW (ref 22–32)
Calcium: 8.5 mg/dL — ABNORMAL LOW (ref 8.9–10.3)
Chloride: 108 mmol/L (ref 98–111)
Creatinine, Ser: 0.65 mg/dL (ref 0.44–1.00)
GFR calc Af Amer: >60 mL/min (ref >60)
GFR calc non Af Amer: >60 mL/min (ref >60)
Glucose, Bld: 100 mg/dL — ABNORMAL HIGH (ref 70–99)
Potassium: 3.8 mmol/L (ref 3.5–5.1)
Sodium: 136 mmol/L (ref 135–145)
Total Bilirubin: 0.8 mg/dL (ref 0.3–1.2)
Total Protein: 6.2 g/dL — ABNORMAL LOW (ref 6.5–8.1)

## 2019-06-25 LAB — CBC
HCT: 37.9 % (ref 36.0–46.0)
Hemoglobin: 12.1 g/dL (ref 12.0–15.0)
MCH: 28.7 pg (ref 26.0–34.0)
MCHC: 31.9 g/dL (ref 30.0–36.0)
MCV: 89.8 fL (ref 80.0–100.0)
Platelets: 258 10*3/uL (ref 150–400)
RBC: 4.22 MIL/uL (ref 3.87–5.11)
RDW: 14.9 % (ref 11.5–15.5)
WBC: 5.4 10*3/uL (ref 4.0–10.5)
nRBC: 0 % (ref 0.0–0.2)

## 2019-06-25 NOTE — ED Triage Notes (Signed)
Pt  Here from home with family with c/o fever and aloc , pt has history of dementia and has been admitted several times for fever but never can find a reason , pt took 650mg  of tylenol for temp of 102 at home

## 2019-06-25 NOTE — ED Notes (Signed)
Pt is altered/confused asked Pt's daughter to stay to help keep Pt safe.

## 2019-06-26 ENCOUNTER — Inpatient Hospital Stay (HOSPITAL_COMMUNITY): Payer: Medicare HMO

## 2019-06-26 ENCOUNTER — Encounter (HOSPITAL_COMMUNITY): Payer: Self-pay | Admitting: Internal Medicine

## 2019-06-26 DIAGNOSIS — G309 Alzheimer's disease, unspecified: Secondary | ICD-10-CM | POA: Diagnosis present

## 2019-06-26 DIAGNOSIS — I5032 Chronic diastolic (congestive) heart failure: Secondary | ICD-10-CM | POA: Diagnosis present

## 2019-06-26 DIAGNOSIS — R630 Anorexia: Secondary | ICD-10-CM | POA: Diagnosis present

## 2019-06-26 DIAGNOSIS — E872 Acidosis: Secondary | ICD-10-CM | POA: Diagnosis present

## 2019-06-26 DIAGNOSIS — E785 Hyperlipidemia, unspecified: Secondary | ICD-10-CM | POA: Diagnosis present

## 2019-06-26 DIAGNOSIS — G4700 Insomnia, unspecified: Secondary | ICD-10-CM | POA: Diagnosis present

## 2019-06-26 DIAGNOSIS — N390 Urinary tract infection, site not specified: Secondary | ICD-10-CM | POA: Diagnosis present

## 2019-06-26 DIAGNOSIS — R410 Disorientation, unspecified: Secondary | ICD-10-CM | POA: Diagnosis present

## 2019-06-26 DIAGNOSIS — U071 COVID-19: Secondary | ICD-10-CM

## 2019-06-26 DIAGNOSIS — I82432 Acute embolism and thrombosis of left popliteal vein: Secondary | ICD-10-CM | POA: Diagnosis present

## 2019-06-26 DIAGNOSIS — Z7984 Long term (current) use of oral hypoglycemic drugs: Secondary | ICD-10-CM | POA: Diagnosis not present

## 2019-06-26 DIAGNOSIS — M7989 Other specified soft tissue disorders: Secondary | ICD-10-CM

## 2019-06-26 DIAGNOSIS — Z79891 Long term (current) use of opiate analgesic: Secondary | ICD-10-CM | POA: Diagnosis not present

## 2019-06-26 DIAGNOSIS — J1282 Pneumonia due to coronavirus disease 2019: Secondary | ICD-10-CM

## 2019-06-26 DIAGNOSIS — F0280 Dementia in other diseases classified elsewhere without behavioral disturbance: Secondary | ICD-10-CM | POA: Diagnosis present

## 2019-06-26 DIAGNOSIS — Z888 Allergy status to other drugs, medicaments and biological substances status: Secondary | ICD-10-CM | POA: Diagnosis not present

## 2019-06-26 DIAGNOSIS — Z79899 Other long term (current) drug therapy: Secondary | ICD-10-CM | POA: Diagnosis not present

## 2019-06-26 DIAGNOSIS — G8929 Other chronic pain: Secondary | ICD-10-CM

## 2019-06-26 DIAGNOSIS — E119 Type 2 diabetes mellitus without complications: Secondary | ICD-10-CM | POA: Diagnosis present

## 2019-06-26 DIAGNOSIS — I110 Hypertensive heart disease with heart failure: Secondary | ICD-10-CM | POA: Diagnosis present

## 2019-06-26 DIAGNOSIS — Z885 Allergy status to narcotic agent status: Secondary | ICD-10-CM | POA: Diagnosis not present

## 2019-06-26 DIAGNOSIS — G894 Chronic pain syndrome: Secondary | ICD-10-CM | POA: Diagnosis present

## 2019-06-26 DIAGNOSIS — I2510 Atherosclerotic heart disease of native coronary artery without angina pectoris: Secondary | ICD-10-CM | POA: Diagnosis present

## 2019-06-26 DIAGNOSIS — M549 Dorsalgia, unspecified: Secondary | ICD-10-CM | POA: Diagnosis present

## 2019-06-26 DIAGNOSIS — Z951 Presence of aortocoronary bypass graft: Secondary | ICD-10-CM | POA: Diagnosis not present

## 2019-06-26 DIAGNOSIS — Z882 Allergy status to sulfonamides status: Secondary | ICD-10-CM | POA: Diagnosis not present

## 2019-06-26 HISTORY — DX: Pneumonia due to coronavirus disease 2019: J12.82

## 2019-06-26 HISTORY — DX: COVID-19: U07.1

## 2019-06-26 LAB — FERRITIN: Ferritin: 133 ng/mL (ref 11–307)

## 2019-06-26 LAB — CBG MONITORING, ED
Glucose-Capillary: 59 mg/dL — ABNORMAL LOW (ref 70–99)
Glucose-Capillary: 110 mg/dL — ABNORMAL HIGH (ref 70–99)
Glucose-Capillary: 82 mg/dL (ref 70–99)
Glucose-Capillary: 84 mg/dL (ref 70–99)

## 2019-06-26 LAB — PROCALCITONIN: Procalcitonin: <0.1 ng/mL

## 2019-06-26 LAB — HEMOGLOBIN A1C
Hgb A1c MFr Bld: 6.9 % — ABNORMAL HIGH (ref 4.8–5.6)
Mean Plasma Glucose: 151.33 mg/dL

## 2019-06-26 LAB — GLUCOSE, CAPILLARY: Glucose-Capillary: 101 mg/dL — ABNORMAL HIGH (ref 70–99)

## 2019-06-26 LAB — LACTIC ACID, PLASMA: Lactic Acid, Venous: 1.1 mmol/L (ref 0.5–1.9)

## 2019-06-26 LAB — C-REACTIVE PROTEIN: CRP: 8.5 mg/dL — ABNORMAL HIGH (ref <1.0)

## 2019-06-26 LAB — ABO/RH: ABO/RH(D): O POS

## 2019-06-26 LAB — TROPONIN I (HIGH SENSITIVITY): Troponin I (High Sensitivity): 5 ng/L (ref <18)

## 2019-06-26 LAB — D-DIMER, QUANTITATIVE: D-Dimer, Quant: 1.64 ug/mL-FEU — ABNORMAL HIGH (ref 0.00–0.50)

## 2019-06-26 LAB — BRAIN NATRIURETIC PEPTIDE: B Natriuretic Peptide: 98.8 pg/mL (ref 0.0–100.0)

## 2019-06-26 LAB — FIBRINOGEN: Fibrinogen: 618 mg/dL — ABNORMAL HIGH (ref 210–475)

## 2019-06-26 LAB — POC SARS CORONAVIRUS 2 AG -  ED: SARS Coronavirus 2 Ag: POSITIVE — AB

## 2019-06-26 MED ORDER — MORPHINE SULFATE ER 15 MG PO TBCR
15.0000 mg | EXTENDED_RELEASE_TABLET | Freq: Two times a day (BID) | ORAL | Status: DC
  Administered 2019-06-26: 15 mg via ORAL
  Filled 2019-06-26: qty 1

## 2019-06-26 MED ORDER — SODIUM CHLORIDE 0.9 % IV SOLN
1.0000 g | Freq: Once | INTRAVENOUS | Status: AC
  Administered 2019-06-26: 1 g via INTRAVENOUS
  Filled 2019-06-26: qty 10

## 2019-06-26 MED ORDER — ACETAMINOPHEN 650 MG RE SUPP
650.0000 mg | RECTAL | Status: DC | PRN
  Administered 2019-06-26: 650 mg via RECTAL
  Filled 2019-06-26: qty 1

## 2019-06-26 MED ORDER — LACTATED RINGERS IV BOLUS
500.0000 mL | Freq: Once | INTRAVENOUS | Status: AC
  Administered 2019-06-26: 500 mL via INTRAVENOUS

## 2019-06-26 MED ORDER — ENOXAPARIN SODIUM 40 MG/0.4ML ~~LOC~~ SOLN
40.0000 mg | SUBCUTANEOUS | Status: DC
  Administered 2019-06-26: 40 mg via SUBCUTANEOUS
  Filled 2019-06-26: qty 0.4

## 2019-06-26 MED ORDER — ALBUTEROL SULFATE HFA 108 (90 BASE) MCG/ACT IN AERS
2.0000 | INHALATION_SPRAY | Freq: Four times a day (QID) | RESPIRATORY_TRACT | Status: DC | PRN
  Filled 2019-06-26: qty 6.7

## 2019-06-26 MED ORDER — SODIUM CHLORIDE 0.9 % IV SOLN
200.0000 mg | Freq: Once | INTRAVENOUS | Status: AC
  Administered 2019-06-26: 200 mg via INTRAVENOUS
  Filled 2019-06-26: qty 40

## 2019-06-26 MED ORDER — GUAIFENESIN-DM 100-10 MG/5ML PO SYRP: 10.0000 mL | ORAL_SOLUTION | ORAL | Status: DC | PRN

## 2019-06-26 MED ORDER — ASCORBIC ACID 500 MG PO TABS
500.0000 mg | ORAL_TABLET | Freq: Every day | ORAL | Status: DC
  Filled 2019-06-26 (×2): qty 1

## 2019-06-26 MED ORDER — ZINC SULFATE 220 (50 ZN) MG PO CAPS
220.0000 mg | ORAL_CAPSULE | Freq: Every day | ORAL | Status: DC
  Administered 2019-06-26: 220 mg via ORAL
  Filled 2019-06-26 (×2): qty 1

## 2019-06-26 MED ORDER — MORPHINE SULFATE ER 15 MG PO TBCR
15.0000 mg | EXTENDED_RELEASE_TABLET | Freq: Two times a day (BID) | ORAL | Status: DC
  Administered 2019-06-26 (×2): 15 mg via ORAL
  Filled 2019-06-26 (×3): qty 1

## 2019-06-26 MED ORDER — LORAZEPAM 0.5 MG PO TABS
0.5000 mg | ORAL_TABLET | Freq: Every evening | ORAL | Status: DC | PRN
  Administered 2019-06-26: 0.5 mg via ORAL
  Filled 2019-06-26: qty 1

## 2019-06-26 MED ORDER — ASPIRIN EC 81 MG PO TBEC
81.0000 mg | DELAYED_RELEASE_TABLET | Freq: Every day | ORAL | Status: DC
  Administered 2019-06-26: 81 mg via ORAL
  Filled 2019-06-26 (×2): qty 1

## 2019-06-26 MED ORDER — SODIUM CHLORIDE 0.9 % IV SOLN
500.0000 mg | Freq: Once | INTRAVENOUS | Status: AC
  Administered 2019-06-26: 500 mg via INTRAVENOUS
  Filled 2019-06-26: qty 500

## 2019-06-26 MED ORDER — INSULIN ASPART 100 UNIT/ML ~~LOC~~ SOLN: 0.0000 [IU] | Freq: Three times a day (TID) | SUBCUTANEOUS | Status: DC

## 2019-06-26 MED ORDER — FUROSEMIDE 40 MG PO TABS
40.0000 mg | ORAL_TABLET | Freq: Every day | ORAL | Status: DC
  Administered 2019-06-26: 40 mg via ORAL
  Filled 2019-06-26: qty 1
  Filled 2019-06-26: qty 2

## 2019-06-26 MED ORDER — INSULIN ASPART 100 UNIT/ML ~~LOC~~ SOLN: 0.0000 [IU] | Freq: Every day | SUBCUTANEOUS | Status: DC

## 2019-06-26 MED ORDER — MORPHINE SULFATE 15 MG PO TABS
15.0000 mg | ORAL_TABLET | Freq: Four times a day (QID) | ORAL | Status: DC | PRN
  Administered 2019-06-26 – 2019-06-27 (×2): 15 mg via ORAL
  Filled 2019-06-26 (×3): qty 1

## 2019-06-26 MED ORDER — POTASSIUM CHLORIDE CRYS ER 20 MEQ PO TBCR
40.0000 meq | EXTENDED_RELEASE_TABLET | Freq: Once | ORAL | Status: AC
  Administered 2019-06-26: 40 meq via ORAL
  Filled 2019-06-26: qty 2

## 2019-06-26 MED ORDER — ACETAMINOPHEN 325 MG PO TABS: 650.0000 mg | ORAL_TABLET | Freq: Four times a day (QID) | ORAL | Status: DC | PRN

## 2019-06-26 MED ORDER — SODIUM CHLORIDE 0.9 % IV SOLN: INTRAVENOUS | Status: DC

## 2019-06-26 MED ORDER — SODIUM CHLORIDE 0.9 % IV SOLN
1.0000 g | INTRAVENOUS | Status: DC
  Administered 2019-06-27: 1 g via INTRAVENOUS
  Filled 2019-06-26: qty 10

## 2019-06-26 MED ORDER — ENOXAPARIN SODIUM 60 MG/0.6ML ~~LOC~~ SOLN
60.0000 mg | Freq: Two times a day (BID) | SUBCUTANEOUS | Status: DC
  Administered 2019-06-26 – 2019-06-27 (×2): 60 mg via SUBCUTANEOUS
  Filled 2019-06-26 (×3): qty 0.6

## 2019-06-26 MED ORDER — LORAZEPAM 1 MG PO TABS
0.5000 mg | ORAL_TABLET | Freq: Once | ORAL | Status: AC
  Administered 2019-06-26: 0.5 mg via ORAL
  Filled 2019-06-26: qty 1

## 2019-06-26 MED ORDER — SODIUM CHLORIDE 0.9 % IV SOLN
100.0000 mg | Freq: Every day | INTRAVENOUS | Status: DC
  Filled 2019-06-26: qty 20

## 2019-06-26 NOTE — ED Notes (Addendum)
Extra Labs draw for covid lab protocol

## 2019-06-26 NOTE — ED Notes (Signed)
Dinner Tray Ordered @ 1807.  

## 2019-06-26 NOTE — H&P (Signed)
History and Physical    Meredith Gutierrez ZOX:[id number] DOB: [birth year] DOA: 06/25/2019  PCP: Ria Bush, MD Patient coming from: Home  Chief Complaint: Fever  HPI: Meredith Gutierrez is a 79 y.o. female with medical history significant of CAD status post CABG, dementia, diabetes mellitus, chronic diastolic congestive heart failure, hypertension, hyperlipidemia, and conditions listed below presenting to the ED for evaluation of fever.  No history could be obtained from the patient given her baseline dementia.  She has no complaints.  History obtained from the patient's daughter who explains that the patient has advanced dementia at baseline and is oriented to self only.  Yesterday she had a fever of 102 F.  Patient also complained of dysuria at home.  She is not eating much.  No vomiting or diarrhea.  No abdominal pain.  She is not coughing much and not having shortness of breath.  No recent falls or loss of consciousness.  ED Course: SARS-CoV-2 rapid antigen test positive.  Temperature 100 F on arrival.  Slightly tachypneic but not hypoxic.  Maintaining oxygen saturation in the mid to upper 90s.  Not tachycardic.  Labs showing no leukocytosis.  Lactic acid normal.  Bicarb 18, anion gap 10.  Blood glucose 100.  BUN 14, creatinine 0.6.  UA with large amount of leukocytes and 21-50 WBCs.  Urine culture pending.  Blood culture x2 pending.  BNP normal.  High-sensitivity troponin negative.  Chest x-ray personally reviewed showing scattered peripheral airspace opacities concerning for multifocal pneumonia. Patient received Ativan, MS Contin, ceftriaxone, azithromycin, and a 500 cc LR bolus.  Review of Systems:  All systems reviewed and apart from history of presenting illness, are negative.  Past Medical History:  Diagnosis Date  . Angina    never needed to take nitroglycerin.  . Anxiety   . Blood transfusion without reported diagnosis   . Cerebral atherosclerosis   . COPD (chronic obstructive  pulmonary disease) ([place])    02-16-13-"pt. denies this"-shows on CXR of 2- 2013.  Marland Kitchen Coronary artery disease 2003   s/p CABG 2014  . Dementia ([place])   . Diabetes mellitus without complication ([place]) 81/1914   NEW ONSET  . Diastolic CHF, on admit treated. 08/10/2011  . Family history of adverse reaction to anesthesia    Dad and Sister had trouble waking up  . Fibromuscular dysplasia of renal artery (HCC)    right  . Heart murmur   . History of kidney stones 02-16-13   "hx. of overgrowth of muscle-causing some stricture"renal artery stenosis-being followed [facility]  . Hyperlipidemia   . Hypertension   . Hyperthyroidism    Balan  . Hyponatremia 04/2018  . Melanoma ([place]) 2010   skin cancers, 1 melanoma removed back  . Osteoarthritis     Past Surgical History:  Procedure Laterality Date  . ABDOMINAL HYSTERECTOMY  1970   heavy bleeding  . APPENDECTOMY  1960s  . CARDIAC CATHETERIZATION  08/09/2011   no sign. coronary obstruction. LIMA is atretic w/excellent flow down the native LAD  . CARDIAC CATHETERIZATION  08/22/2001   no sign. coronary stenosis,  . CATARACT EXTRACTION, BILATERAL Bilateral   . CESAREAN SECTION    . CORONARY ARTERY BYPASS GRAFT  02-16-13   3'03-no bypasses -Had 2 aneurysm repairs(stents used)  . ESOPHAGOGASTRODUODENOSCOPY  04/2018   LA Grade A esophagitis, small nodule distal esophagus (benign schwann cell hamartoma), empiric dilation to 29m ([facility])  . LAPAROTOMY N/A 02/20/2013   Procedure: EXPLORATORY LAPAROTOMY;  WJanie Morning MD  . LEFT  HEART CATHETERIZATION WITH CORONARY ANGIOGRAM N/A 08/09/2011   Procedure: LEFT HEART CATHETERIZATION WITH CORONARY ANGIOGRAM;  Surgeon: Sanda Klein, MD;  Location: [facility];  Service: Cardiovascular;  Laterality: N/A;  . NM MYOCAR PERF WALL MOTION  07/20/2010   normal  . RADIOLOGY WITH ANESTHESIA N/A 02/22/2019   Procedure: MRI OF ABDOMEN WITH AND WITHOUT CONTRAST;  Surgeon: Radiologist, Medication, MD;  Location: [facility];  Service:  Radiology;  Laterality: N/A;  . SALPINGOOPHORECTOMY Bilateral 02/20/2013   benign seromucinous cystadenofibroma R ovary [name], MD)  . TOOTH EXTRACTION    . TOTAL HIP ARTHROPLASTY Left 2015     reports that she quit smoking about 21 months ago. Her smoking use included cigarettes. She has a 2.00 pack-year smoking history. She has never used smokeless tobacco. She reports that she does not drink alcohol or use drugs.  Allergies  Allergen Reactions  . Codeine Anaphylaxis and Shortness Of Breath  . Prednisolone Acetate Shortness Of Breath and Nausea Only  . Prednisone Hives, Shortness Of Breath and Swelling  . Nitroglycerin Other (See Comments)    Reaction not recalled   . Metolazone Other (See Comments)    Hypokalemia, hyponatremia  . Adhesive [Tape] Rash and Other (See Comments)    SKIN IS VERY SENSITIVE- TEARS AND BRUISES EASILY, also  . Crestor [Rosuvastatin Calcium] Other (See Comments)    Weakness with statins  . Lopressor [Metoprolol Tartrate] Other (See Comments)    Bradycardia   . Sulfamethoxazole Hives and Swelling  . Zetia [Ezetimibe] Other (See Comments)    Weakness     Family History  Problem Relation Age of Onset  . Stroke Mother   . Hypotension Mother   . Dementia Mother 28  . Coronary artery disease Father   . Hypertension Father   . Breast cancer Maternal Aunt   . Hypotension Sister   . Hypertension Brother   . Diabetes Brother   . Lung cancer Daughter   . Thyroid cancer Daughter   . Colon polyps Maternal Grandfather   . Colon cancer Neg Hx   . Esophageal cancer Neg Hx   . Stomach cancer Neg Hx   . Rectal cancer Neg Hx     Prior to Admission medications   Medication Sig Start Date End Date Taking? Authorizing Provider  albuterol (PROVENTIL) (2.5 MG/3ML) 0.083% nebulizer solution Take 3 mLs (2.5 mg total) by nebulization every 6 (six) hours as needed for wheezing or shortness of breath. 12/13/17  Yes Ria Bush, MD  aspirin EC 81 MG  tablet Take 81 mg by mouth daily.   Yes [provider]  canagliflozin (INVOKANA) 100 MG TABS tablet Take 1 tablet (100 mg total) by mouth daily before breakfast. 03/05/19  Yes Ria Bush, MD  Cholecalciferol (VITAMIN D) 2000 units CAPS Take 1 capsule (2,000 Units total) by mouth daily. 04/13/17  Yes Ria Bush, MD  LORazepam (ATIVAN) 0.5 MG tablet TAKE 1 TABLET BY MOUTH DAILY AT BEDTIME AS NEEDED FOR ANXIETY OR SLEEP MAY TAKE SECOND 1/2 TABLET AS NEEDED Patient taking differently: Take 0.5 mg by mouth at bedtime as needed for anxiety or sleep.  06/18/19  Yes Ria Bush, MD  metFORMIN (GLUCOPHAGE-XR) 500 MG 24 hr tablet Take 2 tablets (1,000 mg total) by mouth daily with breakfast. 03/05/19  Yes Ria Bush, MD  morphine (MS CONTIN) 15 MG 12 hr tablet Take 1 tablet (15 mg total) by mouth See admin instructions. Patient taking differently: Take 15 mg by mouth every 12 (twelve) hours.  05/01/19  Yes Gladys Damme, MD  morphine (MSIR) 15 MG tablet Take 15 mg by mouth every 6 (six) hours as needed for severe pain.   Yes [provider]  ACCU-CHEK FASTCLIX LANCETS MISC 1 each by Does not apply route daily. Use as directed to check blood sugar once daily.  Dx code:  E11.65 05/02/18   Ria Bush, MD  Alcohol Swabs (B-D SINGLE USE SWABS REGULAR) PADS 1 each by Does not apply route daily. Use as directed to check blood sugar once daily.  Dx code:  E11.65 05/02/18   Ria Bush, MD  Blood Glucose Monitoring Suppl (ACCU-CHEK AVIVA PLUS) w/Device KIT 1 each by Does not apply route daily. Use as directed to check blood sugar once daily.  Dx code:  E11.65 05/02/18   Ria Bush, MD  furosemide (LASIX) 40 MG tablet Take 120 mg (3 tablets) once daily for a weight of 135 pounds and over and take 80 mg (2 tablets) once daily for a weight of 134 and below. Patient not taking: Reported on 06/26/2019 02/14/19   Croitoru, Mihai, MD  glucose blood (ACCU-CHEK AVIVA  PLUS) test strip 1 each by Other route as needed for other. Use as instructed to check blood sugar once daily.  Dx code:  E11.65 05/02/18   Ria Bush, MD  polyethylene glycol (MIRALAX / GLYCOLAX) 17 g packet Take 17 g by mouth daily as needed. Patient not taking: Reported on 06/26/2019 12/23/18   Mariel Aloe, MD  potassium chloride SA (KLOR-CON) 20 MEQ tablet Take 1 tablet (20 mEq total) by mouth daily. Patient not taking: Reported on 06/26/2019 05/17/19   Ria Bush, MD    Physical Exam: Vitals:   06/26/19 0300 06/26/19 0330 06/26/19 0500 06/26/19 0636  BP: 138/81 93/63  119/77  Pulse: 71 80 80 89  Resp: 19 17 (!) 21 16  Temp:      TempSrc:      SpO2: 93% (!) 67% 97% 99%    Physical Exam  Constitutional: She appears well-developed and well-nourished. No distress.  HENT:  Head: Normocephalic.  Dry mucous membranes  Eyes: Right eye exhibits no discharge. Left eye exhibits no discharge.  Cardiovascular: Normal rate, regular rhythm and intact distal pulses.  Pulmonary/Chest: Effort normal. No respiratory distress. She has no wheezes. She has rales.  Bibasilar rales Oxygen saturation 100% on room air  Abdominal: Soft. Bowel sounds are normal. She exhibits no distension. There is no abdominal tenderness. There is no guarding.  Musculoskeletal:        General: Edema present.     Cervical back: Neck supple.     Comments: +2 pitting edema bilateral lower extremities (chronic per daughter) Erythema and warmth to touch noted on the medial aspect of the left lower leg  Neurological: No cranial nerve deficit.  Awake and alert No focal motor or sensory deficit  Skin: Skin is warm and dry. She is not diaphoretic.     Labs on Admission: I have personally reviewed following labs and imaging studies  CBC: Recent Labs  Lab 06/25/19 1725  WBC 5.4  HGB 12.1  HCT 37.9  MCV 89.8  PLT 211   Basic Metabolic Panel: Recent Labs  Lab 06/25/19 1725  NA 136  K 3.8  CL 108   CO2 18*  GLUCOSE 100*  BUN 14  CREATININE 0.65  CALCIUM 8.5*   GFR: CrCl cannot be calculated (Unknown ideal weight.). Liver Function Tests: Recent Labs  Lab 06/25/19 1725  AST 34  ALT 34  ALKPHOS 69  BILITOT 0.8  PROT 6.2*  ALBUMIN 2.9*   No results for input(s): LIPASE, AMYLASE in the last 168 hours. No results for input(s): AMMONIA in the last 168 hours. Coagulation Profile: No results for input(s): INR, PROTIME in the last 168 hours. Cardiac Enzymes: No results for input(s): CKTOTAL, CKMB, CKMBINDEX, TROPONINI in the last 168 hours. BNP (last 3 results) No results for input(s): PROBNP in the last 8760 hours. HbA1C: Recent Labs    06/26/19 0423  HGBA1C 6.9*   CBG: No results for input(s): GLUCAP in the last 168 hours. Lipid Profile: No results for input(s): CHOL, HDL, LDLCALC, TRIG, CHOLHDL, LDLDIRECT in the last 72 hours. Thyroid Function Tests: No results for input(s): TSH, T4TOTAL, FREET4, T3FREE, THYROIDAB in the last 72 hours. Anemia Panel: No results for input(s): VITAMINB12, FOLATE, FERRITIN, TIBC, IRON, RETICCTPCT in the last 72 hours. Urine analysis:    Component Value Date/Time   COLORURINE YELLOW 06/25/2019 1744   APPEARANCEUR HAZY (A) 06/25/2019 1744   LABSPEC 1.026 06/25/2019 1744   PHURINE 5.0 06/25/2019 1744   GLUCOSEU >=500 (A) 06/25/2019 1744   HGBUR NEGATIVE 06/25/2019 1744   BILIRUBINUR NEGATIVE 06/25/2019 1744   BILIRUBINUR negative 09/01/2018 1432   KETONESUR NEGATIVE 06/25/2019 1744   PROTEINUR NEGATIVE 06/25/2019 1744   UROBILINOGEN 1.0 09/01/2018 1432   UROBILINOGEN 0.2 01/06/2014 1530   NITRITE NEGATIVE 06/25/2019 1744   LEUKOCYTESUR LARGE (A) 06/25/2019 1744    Radiological Exams on Admission: DG Chest 2 View  Result Date: 06/25/2019 CLINICAL DATA:  Fever EXAM: CHEST - 2 VIEW COMPARISON:  April 29, 2019 FINDINGS: There are prominent bilateral interstitial lung markings with focal areas of lung space consolidation  peripherally. The heart size remains enlarged. The patient is status post prior median sternotomy. There may be small bilateral pleural effusions. There are degenerative changes throughout the visualized thoracolumbar spine. There are degenerative changes of both glenohumeral joints. There is no pneumothorax. IMPRESSION: 1. Scattered peripheral airspace opacities concerning for multifocal pneumonia (viral or bacterial). 2. Prominent interstitial lung markings with cardiomegaly. Findings can be seen in patients with developing interstitial pulmonary edema. 3. Small bilateral pleural effusions. Electronically Signed   By: Constance Holster M.D.   On: 06/25/2019 17:48    EKG: Independently reviewed.  Sinus rhythm, heart rate 77.  Rate decreased since prior tracing.  Assessment/Plan Principal Problem:   Pneumonia due to COVID-19 virus Active Problems:   Chronic diastolic (congestive) heart failure (HCC)   Diabetes mellitus type 2, uncontrolled, with complications (HCC)   Acute lower UTI   Chronic back pain   Multifocal pneumonia secondary to COVID-19 viral infection SARS-CoV-2 rapid antigen test positive.  Temperature 100 F on arrival.  Slightly tachypneic but not hypoxic.  Oxygen saturation currently 100% on room air.  Not tachycardic. Lactic acid normal.  Chest x-ray personally reviewed showing scattered peripheral airspace opacities concerning for multifocal pneumonia.  No leukocytosis to suggest bacterial pneumonia. -Remdesivir dosing per pharmacy -Unable to start steroid at this time as prednisone is listed as a high risk allergy (anaphylaxis).  Confirmed with daughter. -Vitamin C, zinc -Antitussive as needed -Tylenol as needed -Inhaler as needed -Check inflammatory markers including ferritin, fibrinogen, D-dimer, CRP, LDH  -check procalcitonin level -Airborne and contact precautions -Continuous pulse ox -Supplemental oxygen as needed to keep oxygen saturation above 90% -Blood culture  x2 pending  Acute UTI Patient has been complaining of dysuria at home. UA with large amount of leukocytes and 21-50 WBCs.   -  Ceftriaxone -Urine culture pending  Concern for left lower extremity DVT vs cellulitis  Patient has chronic bilateral lower extremity edema.  Noted to have erythema and warmth to touch of the medial aspect of the left lower leg.  Patient denies neck pain.  Differentials include cellulitis versus possible DVT given increased risk of thromboembolism with COVID-19 viral infection. -Ceftriaxone as above -Left lower extremity Doppler  Non-insulin-dependent diabetes mellitus -Check A1c.  Sliding scale insulin sensitive ACHS and CBG checks.  Chronic diastolic congestive heart failure Has bilateral lower extremity edema but low concern for pulmonary edema given normal BNP. -Patient appears dehydrated.  Hold diuretics at this time.  Anorexia Suspect related to acute viral illness.  No complaints of abdominal pain, vomiting, diarrhea.  Abdominal exam benign. -Gentle IV fluid hydration -Consult dietitian   Normal anion gap metabolic acidosis. Bicarb 18, anion gap 10.  Suspect related to home diuretic use.   -Hold diuretic at this time and continue to monitor labs  Chronic back pain -Continue home MS Contin 15 mg every 12 hours and morphine 15 mg immediate release tablet every 6 hours as needed  Anxiety/insomnia -Continue home Ativan as needed at bedtime  DVT prophylaxis: Lovenox Code Status: Full code.  Discussed with patient's daughter [name]. Family Communication: Daughter at bedside. Disposition Plan: Anticipate discharge after clinical improvement. Consults called: None Admission status: It is my clinical opinion that admission to INPATIENT is reasonable and necessary in this 79 y.o. female . presenting with multifocal pneumonia secondary to COVID-19 viral infection.  High risk of decompensation.  Given the aforementioned, the predictability of an  adverse outcome is felt to be significant. I expect that the patient will require at least 2 midnights in the hospital to treat this condition.   The medical decision making on this patient was of high complexity and the patient is at high risk for clinical deterioration, therefore this is a level 3 visit.  Shela Leff MD [facility] Pager [phone]  If 7PM-7AM, please contact night-coverage [email or web address] Password [facility]  06/26/2019, 7:27 AM

## 2019-06-26 NOTE — Progress Notes (Signed)
ANTICOAGULATION CONSULT NOTE - Initial Consult  Pharmacy Consult for Lovenox  Indication: DVT  Allergies  Allergen Reactions  . Codeine Anaphylaxis and Shortness Of Breath  . Prednisolone Acetate Shortness Of Breath and Nausea Only  . Prednisone Hives, Shortness Of Breath and Swelling  . Nitroglycerin Other (See Comments)    Reaction not recalled   . Metolazone Other (See Comments)    Hypokalemia, hyponatremia  . Adhesive [Tape] Rash and Other (See Comments)    SKIN IS VERY SENSITIVE- TEARS AND BRUISES EASILY, also  . Crestor [Rosuvastatin Calcium] Other (See Comments)    Weakness with statins  . Lopressor [Metoprolol Tartrate] Other (See Comments)    Bradycardia   . Sulfamethoxazole Hives and Swelling  . Zetia [Ezetimibe] Other (See Comments)    Weakness     Patient Measurements: Height: 5' (152.4 cm) Weight: 137 lb (62.1 kg) IBW/kg (Calculated) : 45.5 Heparin Dosing Weight: 62.1 kg  Vital Signs: Temp: 99.5 F (37.5 C) (01/12 1145) Temp Source: Rectal (01/12 1145) BP: 158/67 (01/12 1645) Pulse Rate: 87 (01/12 1745)  Labs: Recent Labs    06/25/19 1725 06/26/19 0206  HGB 12.1  --   HCT 37.9  --   PLT 258  --   CREATININE 0.65  --   TROPONINIHS  --  5    Estimated Creatinine Clearance: 46.1 mL/min (by C-G formula based on SCr of 0.65 mg/dL).   Medical History: Past Medical History:  Diagnosis Date  . Angina    never needed to take nitroglycerin.  . Anxiety   . Blood transfusion without reported diagnosis   . Cerebral atherosclerosis   . COPD (chronic obstructive pulmonary disease) ([place])    02-16-13-"pt. denies this"-shows on CXR of 2- 2013.  Marland Kitchen Coronary artery disease 2003   s/p CABG 2014  . Dementia ([place])   . Diabetes mellitus without complication ([place]) [id number]   NEW ONSET  . Diastolic CHF, on admit treated. 08/10/2011  . Family history of adverse reaction to anesthesia    Dad and Sister had trouble waking up  . Fibromuscular dysplasia of renal  artery (HCC)    right  . Heart murmur   . History of kidney stones 02-16-13   "hx. of overgrowth of muscle-causing some stricture"renal artery stenosis-being followed [facility]  . Hyperlipidemia   . Hypertension   . Hyperthyroidism    Balan  . Hyponatremia 04/2018  . Melanoma ([place]) 2010   skin cancers, 1 melanoma removed back  . Osteoarthritis     Medications:  Scheduled:  . vitamin C  500 mg Oral Daily  . aspirin EC  81 mg Oral Daily  . enoxaparin (LOVENOX) injection  60 mg Subcutaneous Q12H  . furosemide  40 mg Oral Daily  . insulin aspart  0-5 Units Subcutaneous QHS  . insulin aspart  0-9 Units Subcutaneous TID WC  . morphine  15 mg Oral Q12H  . zinc sulfate  220 mg Oral Daily    Assessment: Patient is a 79 yof that is being admitted for COVID and was found to have a DVT after ultrasound was completed. Pharmacy has been asked to dose Lovenox in this patient for their DVT .   Goal of Therapy:  Anti-Xa level 0.6-1 units/ml 4hrs after LMWH dose given Monitor platelets by anticoagulation protocol: Yes   Plan:  - Patient received 40 mg subQ this morning for their DVT ppx dose  - Will start therapeutic dosing tonight with Lovenox 60 mg PO BID  - Monitor patient  for s/s of bleeding and renal function  - Consider obtaining Xa levels if plans for long term anticoagulation with Lovenox   Duanne Limerick PharmD. BCPS  06/26/2019,7:53 PM

## 2019-06-26 NOTE — ED Notes (Signed)
Lunch Tray Ordered @ 1114.  

## 2019-06-26 NOTE — ED Notes (Addendum)
Pt had partially witnessed fall in room; as this RN walking by room, this RN noticed pt up walking at end of bed, as this RN opened door, pt slipped and fell backwards in what appeared to be urine; Irena Cords, PA, at bedside; unsure if pt hit head, as she was out of view upon falling; pt not c/o any pain, no deformities noted, pt able to walk back to bed with assistance; full range of motion in all extremities; Dr. Broadus John notified; no further orders at this time, will continue to monitor pt

## 2019-06-26 NOTE — ED Notes (Signed)
Pt alert, daughter at bedside; pt given orange juice and [place] sandwich, currently eating and drinking

## 2019-06-26 NOTE — ED Notes (Signed)
ED TO INPATIENT HANDOFF REPORT  ED Nurse Name and Phone #: Suzetta Timko RN  [phone]  S Name/Age/Gender Meredith Gutierrez 79 y.o. female Room/Bed: 009C/009C  Code Status   Code Status: Full Code  Home/SNF/Other Home Patient oriented to: self Is this baseline? No   Triage Complete: Triage complete  Chief Complaint Pneumonia due to COVID-19 virus [U07.1, J12.82]  Triage Note Pt  Here from home with family with c/o fever and aloc , pt has history of dementia and has been admitted several times for fever but never can find a reason , pt took 687m of tylenol for temp of 102 at home     Allergies Allergies  Allergen Reactions  . Codeine Anaphylaxis and Shortness Of Breath  . Prednisolone Acetate Shortness Of Breath and Nausea Only  . Prednisone Hives, Shortness Of Breath and Swelling  . Nitroglycerin Other (See Comments)    Reaction not recalled   . Metolazone Other (See Comments)    Hypokalemia, hyponatremia  . Adhesive [Tape] Rash and Other (See Comments)    SKIN IS VERY SENSITIVE- TEARS AND BRUISES EASILY, also  . Crestor [Rosuvastatin Calcium] Other (See Comments)    Weakness with statins  . Lopressor [Metoprolol Tartrate] Other (See Comments)    Bradycardia   . Sulfamethoxazole Hives and Swelling  . Zetia [Ezetimibe] Other (See Comments)    Weakness     Level of Care/Admitting Diagnosis ED Disposition    ED Disposition Condition Comment   Admit  [id number]  Level of Care: Telemetry Medical [[697] Covid Evaluation: Confirmed COVID Positive  Diagnosis: Pneumonia due to COVID-19 virus [[[id number]] Admitting Physician: RShela Leff[[[id number] Attending Physician: RShela Leff[[[id number]] Estimated length of stay: past midnight tomorrow  Certification:: I certify this patient will need inpatient services for at least 2 midnights       B Medical/Surgery History Past Medical History:  Diagnosis Date  . Angina     never needed to take nitroglycerin.  . Anxiety   . Blood transfusion without reported diagnosis   . Cerebral atherosclerosis   . COPD (chronic obstructive pulmonary disease) ([facility]    02-16-13-"pt. denies this"-shows on CXR of 2- 2013.  .Marland KitchenCoronary artery disease 2003   s/p CABG 2014  . Dementia ([facility]   . Diabetes mellitus without complication ([facility] 101/0071  [place]  . Diastolic CHF, on admit treated. 08/10/2011  . Family history of adverse reaction to anesthesia    Dad and Sister had trouble waking up  . Fibromuscular dysplasia of renal artery (HCC)    right  . Heart murmur   . History of kidney stones 02-16-13   "hx. of overgrowth of muscle-causing some stricture"renal artery stenosis-being followed [facility] . Hyperlipidemia   . Hypertension   . Hyperthyroidism    Balan  . Hyponatremia 04/2018  . Melanoma ([facility] 2010   skin cancers, 1 melanoma removed back  . Osteoarthritis    Past Surgical History:  Procedure Laterality Date  . ABDOMINAL HYSTERECTOMY  1970   heavy bleeding  . APPENDECTOMY  1960s  . CARDIAC CATHETERIZATION  08/09/2011   no sign. coronary obstruction. LIMA is atretic w/excellent flow down the native LAD  . CARDIAC CATHETERIZATION  08/22/2001   no sign. coronary stenosis,  . CATARACT EXTRACTION, BILATERAL Bilateral   . CESAREAN SECTION    . CORONARY ARTERY BYPASS GRAFT  02-16-13   3'03-no bypasses -Had 2 aneurysm repairs(stents used)  . ESOPHAGOGASTRODUODENOSCOPY  04/2018   LA Grade A esophagitis, small nodule distal esophagus (benign schwann cell hamartoma), empiric dilation to 11m ([place]bruster)  . LAPAROTOMY N/A 02/20/2013   Procedure: EXPLORATORY LAPAROTOMY;  WJanie Morning MD  . LEFT HEART CATHETERIZATION WITH CORONARY ANGIOGRAM N/A 08/09/2011   Procedure: LEFT HEART CATHETERIZATION WITH CORONARY ANGIOGRAM;  Surgeon: MSanda Klein MD;  [facility];  Service: Cardiovascular;  Laterality: N/A;  . NM MYOCAR PERF WALL MOTION  07/20/2010   normal  .  RADIOLOGY WITH ANESTHESIA N/A 02/22/2019   Procedure: MRI OF ABDOMEN WITH AND WITHOUT CONTRAST;  Surgeon: Radiologist, Medication, MD;  [facility]: [facility]  Service: Radiology;  Laterality: N/A;  . SALPINGOOPHORECTOMY Bilateral 02/20/2013   benign seromucinous cystadenofibroma R ovary ([name] MD)  . TOOTH EXTRACTION    . TOTAL HIP ARTHROPLASTY Left 2015     A IV Location/Drains/Wounds Patient Lines/Drains/Airways Status   Active Line/Drains/Airways    Name:   Placement date:   Placement time:   Site:   Days:   Peripheral IV 04/30/19 Left;Anterior Forearm   04/30/19    1047    Forearm   57   Peripheral IV 06/26/19 Left Hand   06/26/19    0257    Hand   less than 1   External Urinary Catheter   04/19/18    2238    -   433   External Urinary Catheter   04/29/19    1810    -   58          Intake/Output Last 24 hours  Intake/Output Summary (Last 24 hours) at 06/26/2019 2038 Last data filed at 06/26/2019 1018 Gross per 24 hour  Intake 1157.59 ml  Output -  Net 1157.59 ml    Labs/Imaging Results for orders placed or performed during the hospital encounter of 06/25/19 (from the past 48 hour(s))  CBC     Status: None   Collection Time: 06/25/19  5:25 PM  Result Value Ref Range   WBC 5.4 4.0 - 10.5 K/uL   RBC 4.22 3.87 - 5.11 MIL/uL   Hemoglobin 12.1 12.0 - 15.0 g/dL   HCT 37.9 36.0 - 46.0 %   MCV 89.8 80.0 - 100.0 fL   MCH 28.7 26.0 - 34.0 pg   MCHC 31.9 30.0 - 36.0 g/dL   RDW 14.9 11.5 - 15.5 %   Platelets 258 150 - 400 K/uL   nRBC 0.0 0.0 - 0.2 %    Comment: Performed at [facility] [address], [place] [id number] Comprehensive metabolic panel     Status: Abnormal   Collection Time: 06/25/19  5:25 PM  Result Value Ref Range   Sodium 136 135 - 145 mmol/L   Potassium 3.8 3.5 - 5.1 mmol/L   Chloride 108 98 - 111 mmol/L   CO2 18 (L) 22 - 32 mmol/L   Glucose, Bld 100 (H) 70 - 99 mg/dL   BUN 14 8 - 23 mg/dL   Creatinine, Ser 0.65 0.44 - 1.00 mg/dL    Calcium 8.5 (L) 8.9 - 10.3 mg/dL   Total Protein 6.2 (L) 6.5 - 8.1 g/dL   Albumin 2.9 (L) 3.5 - 5.0 g/dL   AST 34 15 - 41 U/L   ALT 34 0 - 44 U/L   Alkaline Phosphatase 69 38 - 126 U/L   Total Bilirubin 0.8 0.3 - 1.2 mg/dL   GFR calc non Af Amer >60 >60 mL/min   GFR calc Af Amer >  60 >60 mL/min   Anion gap 10 5 - 15    Comment: Performed at [facility] [address], [place], [place] [address]  Urinalysis, Routine w reflex microscopic     Status: Abnormal   Collection Time: 06/25/19  5:44 PM  Result Value Ref Range   Color, Urine YELLOW YELLOW   APPearance HAZY (A) CLEAR   Specific Gravity, Urine 1.026 1.005 - 1.030   pH 5.0 5.0 - 8.0   Glucose, UA >=500 (A) NEGATIVE mg/dL   Hgb urine dipstick NEGATIVE NEGATIVE   Bilirubin Urine NEGATIVE NEGATIVE   Ketones, ur NEGATIVE NEGATIVE mg/dL   Protein, ur NEGATIVE NEGATIVE mg/dL   Nitrite NEGATIVE NEGATIVE   Leukocytes,Ua LARGE (A) NEGATIVE   RBC / HPF 0-5 0 - 5 RBC/hpf   WBC, UA 21-50 0 - 5 WBC/hpf   Bacteria, UA FEW (A) NONE SEEN   Squamous Epithelial / LPF 0-5 0 - 5   Mucus PRESENT     Comment: Performed at [facility], [address] [place], [place] [address]  Brain natriuretic peptide     Status: None   Collection Time: 06/26/19  1:55 AM  Result Value Ref Range   B Natriuretic Peptide 98.8 0.0 - 100.0 pg/mL    Comment: Performed at [facility] [address] [place], [place] [address]  Troponin I (High Sensitivity)     Status: None   Collection Time: 06/26/19  2:06 AM  Result Value Ref Range   Troponin I (High Sensitivity) 5 <18 ng/L    Comment: (NOTE) Elevated high sensitivity troponin I (hsTnI) values and significant  changes across serial measurements may suggest ACS but many other  chronic and acute conditions are known to elevate hsTnI results.  Refer to the "Links" section for chest pain algorithms and additional  guidance. Performed at [facility], [place] [address], [place],  [place] [address]   Lactic acid, plasma     Status: None   Collection Time: 06/26/19  2:56 AM  Result Value Ref Range   Lactic Acid, Venous 1.1 0.5 - 1.9 mmol/L    Comment: Performed at [facility] [address] [place],  [address]  POC SARS Coronavirus 2 Ag-ED - Nasal Swab (BD Veritor Kit)     Status: Abnormal   Collection Time: 06/26/19  3:49 AM  Result Value Ref Range   SARS Coronavirus 2 Ag POSITIVE (A) NEGATIVE    Comment: (NOTE) SARS-CoV-2 antigen PRESENT. Positive results indicate the presence of viral antigens, but clinical correlation with patient history and other diagnostic information is necessary to determine patient infection status.  Positive results do not rule out bacterial infection or co-infection  with other viruses. False positive results are rare but can occur, and confirmatory RT-PCR testing may be appropriate in some circumstances. The expected result is Negative. Fact Sheet for Patients: PodPark.tn Fact Sheet for Providers: GiftContent.is  This test is not yet approved or cleared by the [place] FDA and  has been authorized for detection and/or diagnosis of SARS-CoV-2 by FDA under an Emergency Use Authorization (EUA).  This EUA will remain in effect (meaning this test can be used) for the duration of  the COVID-19 declaration under Section 564(b)(1) of the Act, 21 U.S.C. section 360bbb-3(b)(1), unless the a uthorization is terminated or revoked sooner.   Blood culture (routine x 2)     Status: None (Preliminary result)   Collection Time: 06/26/19  4:23  AM   Specimen: BLOOD  Result Value Ref Range   Specimen Description BLOOD RIGHT ANTECUBITAL    Special Requests      BOTTLES DRAWN AEROBIC AND ANAEROBIC Blood Culture results may not be optimal due to an inadequate volume of blood received in culture bottles Performed at [facility] [address] [place], [place] [address]     Culture PENDING    Report Status PENDING   Hemoglobin A1c     Status: Abnormal   Collection Time: 06/26/19  4:23 AM  Result Value Ref Range   Hgb A1c MFr Bld 6.9 (H) 4.8 - 5.6 %    Comment: (NOTE) Pre diabetes:          5.7%-6.4% Diabetes:              >6.4% Glycemic control for   <7.0% adults with diabetes    Mean Plasma Glucose 151.33 mg/dL    Comment: Performed at [facility] [address] [place], [place] [address]  ABO/Rh     Status: None   Collection Time: 06/26/19  4:23 AM  Result Value Ref Range   ABO/RH(D)      O POS Performed at [facility] [address], [place], [place] [address]   C-reactive protein     Status: Abnormal   Collection Time: 06/26/19  4:23 AM  Result Value Ref Range   CRP 8.5 (H) <1.0 mg/dL    Comment: Performed at [facility] [address], [place], [place] [address]  D-dimer, quantitative (not at [facility])     Status: Abnormal   Collection Time: 06/26/19  4:23 AM  Result Value Ref Range   D-Dimer, Quant 1.64 (H) 0.00 - 0.50 ug/mL-FEU    Comment: (NOTE) At the manufacturer cut-off of 0.50 ug/mL FEU, this assay has been documented to exclude PE with a sensitivity and negative predictive value of 97 to 99%.  At this time, this assay has not been approved by the FDA to exclude DVT/VTE. Results should be correlated with clinical presentation. Performed at [facility], [place] [address] [place], [place] [address]   Ferritin     Status: None   Collection Time: 06/26/19  4:23 AM  Result Value Ref Range   Ferritin 133 11 - 307 ng/mL    Comment: Performed at [facility], [place] [address] [place], [place] [address]  Fibrinogen     Status: Abnormal   Collection Time: 06/26/19  4:23 AM  Result Value Ref Range   Fibrinogen 618 (H) 210 - 475 mg/dL    Comment: Performed at [facility] [address], [place],  [address]  Procalcitonin     Status: None   Collection Time: 06/26/19  4:23 AM  Result Value  Ref Range   Procalcitonin <0.10 ng/mL    Comment:        Interpretation: PCT (Procalcitonin) <= 0.5 ng/mL: Systemic infection (sepsis) is not likely. Local bacterial infection is possible. (NOTE)       Sepsis PCT Algorithm           Lower Respiratory Tract                                      Infection PCT Algorithm    ----------------------------     ----------------------------         PCT <  0.25 ng/mL                PCT < 0.10 ng/mL         Strongly encourage             Strongly discourage   discontinuation of antibiotics    initiation of antibiotics    ----------------------------     -----------------------------       PCT 0.25 - 0.50 ng/mL            PCT 0.10 - 0.25 ng/mL               OR       >80% decrease in PCT            Discourage initiation of                                            antibiotics      Encourage discontinuation           of antibiotics    ----------------------------     -----------------------------         PCT >= 0.50 ng/mL              PCT 0.26 - 0.50 ng/mL               AND        <80% decrease in PCT             Encourage initiation of                                             antibiotics       Encourage continuation           of antibiotics    ----------------------------     -----------------------------        PCT >= 0.50 ng/mL                  PCT > 0.50 ng/mL               AND         increase in PCT                  Strongly encourage                                      initiation of antibiotics    Strongly encourage escalation           of antibiotics                                     -----------------------------                                           PCT <= 0.25 ng/mL  OR                                        > 80% decrease in PCT                                     Discontinue / Do not initiate                                             antibiotics Performed at [facility], [place] [address] [place], [place] [address]   CBG monitoring, ED     Status: Abnormal   Collection Time: 06/26/19  9:24 AM  Result Value Ref Range   Glucose-Capillary 59 (L) 70 - 99 mg/dL   Comment 1 Notify RN    Comment 2 Document in Chart   CBG monitoring, ED     Status: Abnormal   Collection Time: 06/26/19 10:55 AM  Result Value Ref Range   Glucose-Capillary 110 (H) 70 - 99 mg/dL   Comment 1 Notify RN    Comment 2 Document in Chart   CBG monitoring, ED     Status: None   Collection Time: 06/26/19 12:45 PM  Result Value Ref Range   Glucose-Capillary 82 70 - 99 mg/dL  CBG monitoring, ED     Status: None   Collection Time: 06/26/19  5:24 PM  Result Value Ref Range   Glucose-Capillary 84 70 - 99 mg/dL   Comment 1 Notify RN    Comment 2 Document in Chart    DG Chest 2 View  Result Date: 06/25/2019 CLINICAL DATA:  Fever EXAM: CHEST - 2 VIEW COMPARISON:  April 29, 2019 FINDINGS: There are prominent bilateral interstitial lung markings with focal areas of lung space consolidation peripherally. The heart size remains enlarged. The patient is status post prior median sternotomy. There may be small bilateral pleural effusions. There are degenerative changes throughout the visualized thoracolumbar spine. There are degenerative changes of both glenohumeral joints. There is no pneumothorax. IMPRESSION: 1. Scattered peripheral airspace opacities concerning for multifocal pneumonia (viral or bacterial). 2. Prominent interstitial lung markings with cardiomegaly. Findings can be seen in patients with developing interstitial pulmonary edema. 3. Small bilateral pleural effusions. Electronically Signed   By: Constance Holster M.D.   On: 06/25/2019 17:48   VAS [place] LOWER EXTREMITY VENOUS (DVT)  Result Date: 06/26/2019  Lower Venous Study Indications: Swelling.  Risk Factors: COVID 19 positive. Limitations: Poor ultrasound/tissue interface and patient positioning, patient movement. Comparison  Study: Performing Technologist: Oliver Hum RVT  Examination Guidelines: A complete evaluation includes B-mode imaging, spectral Doppler, color Doppler, and power Doppler as needed of all accessible portions of each vessel. Bilateral testing is considered an integral part of a complete examination. Limited examinations for reoccurring indications may be performed as noted.  +-----+---------------+---------+-----------+----------+--------------+ RIGHTCompressibilityPhasicitySpontaneityPropertiesThrombus Aging +-----+---------------+---------+-----------+----------+--------------+ CFV  Full           Yes      Yes                                 +-----+---------------+---------+-----------+----------+--------------+   +---------+---------------+---------+-----------+----------+--------------+ LEFT     CompressibilityPhasicitySpontaneityPropertiesThrombus  Aging +---------+---------------+---------+-----------+----------+--------------+ CFV      Full           Yes      Yes                                 +---------+---------------+---------+-----------+----------+--------------+ SFJ      Full                                                        +---------+---------------+---------+-----------+----------+--------------+ FV Prox  Full                                                        +---------+---------------+---------+-----------+----------+--------------+ FV Mid   Full                                                        +---------+---------------+---------+-----------+----------+--------------+ FV DistalFull                                                        +---------+---------------+---------+-----------+----------+--------------+ PFV      Full                                                        +---------+---------------+---------+-----------+----------+--------------+ POP      None           No       No                   Acute           +---------+---------------+---------+-----------+----------+--------------+ PTV      Full                                                        +---------+---------------+---------+-----------+----------+--------------+ PERO                                                  Not visualized +---------+---------------+---------+-----------+----------+--------------+     Summary: Right: No evidence of common femoral vein obstruction. Left: Findings consistent with acute deep vein thrombosis involving the left popliteal vein. No cystic structure found in the popliteal fossa.  *See table(s) above for measurements and observations.    Preliminary     Pending Labs [employer] (From admission, onward)    Start  Ordered   06/27/19 0500  CBC  Tomorrow morning,   R     06/26/19 1214   06/27/19 0500  Comprehensive metabolic panel  Tomorrow morning,   R     06/26/19 1214   06/27/19 0500  C-reactive protein  Tomorrow morning,   R     06/26/19 1214   06/27/19 0500  Ferritin  Tomorrow morning,   R     06/26/19 1214   06/27/19 0500  D-dimer, quantitative (not at [facility])  Tomorrow morning,   R     06/26/19 1214   06/26/19 0150  Urine culture  ONCE - STAT,   STAT     06/26/19 0150   06/26/19 0149  Blood culture (routine x 2)  BLOOD CULTURE X 2,   STAT     06/26/19 0150          Vitals/Pain Today's Vitals   06/26/19 1605 06/26/19 1645 06/26/19 1745 06/26/19 1849  BP:  (!) 158/67    Pulse: 81 88 87   Resp:  (!) 25 17   Temp:      TempSrc:      SpO2: 94% 92% 94%   Weight:    62.1 kg  Height:    5' (1.524 m)  PainSc:        Isolation Precautions Airborne and Contact precautions  Medications Medications  aspirin EC tablet 81 mg (81 mg Oral Given 06/26/19 0945)  morphine (MS CONTIN) 12 hr tablet 15 mg (15 mg Oral Given 06/26/19 0944)  morphine (MSIR) tablet 15 mg (15 mg Oral Given 06/26/19 1853)  LORazepam (ATIVAN) tablet 0.5 mg (has no administration in time range)   insulin aspart (novoLOG) injection 0-9 Units (0 Units Subcutaneous Not Given 06/26/19 1727)  insulin aspart (novoLOG) injection 0-5 Units (has no administration in time range)  remdesivir 200 mg in sodium chloride 0.9% 250 mL IVPB (0 mg Intravenous Stopped 06/26/19 1012)    Followed by  remdesivir 100 mg in sodium chloride 0.9 % 100 mL IVPB (has no administration in time range)  albuterol (VENTOLIN HFA) 108 (90 Base) MCG/ACT inhaler 2 puff (has no administration in time range)  guaiFENesin-dextromethorphan (ROBITUSSIN DM) 100-10 MG/5ML syrup 10 mL (has no administration in time range)  ascorbic acid (VITAMIN C) tablet 500 mg (500 mg Oral Not Given 06/26/19 0946)  zinc sulfate capsule 220 mg (220 mg Oral Given 06/26/19 0945)  acetaminophen (TYLENOL) tablet 650 mg (has no administration in time range)  cefTRIAXone (ROCEPHIN) 1 g in sodium chloride 0.9 % 100 mL IVPB (has no administration in time range)  acetaminophen (TYLENOL) suppository 650 mg (650 mg Rectal Given 06/26/19 1232)  furosemide (LASIX) tablet 40 mg (40 mg Oral Given 06/26/19 1237)  enoxaparin (LOVENOX) injection 60 mg (has no administration in time range)  cefTRIAXone (ROCEPHIN) 1 g in sodium chloride 0.9 % 100 mL IVPB (0 g Intravenous Stopped 06/26/19 0530)  azithromycin (ZITHROMAX) 500 mg in sodium chloride 0.9 % 250 mL IVPB (0 mg Intravenous Stopped 06/26/19 0630)  lactated ringers bolus 500 mL (0 mLs Intravenous Stopped 06/26/19 0846)  LORazepam (ATIVAN) tablet 0.5 mg (0.5 mg Oral Given 06/26/19 0237)  potassium chloride SA (KLOR-CON) CR tablet 40 mEq (40 mEq Oral Given 06/26/19 1240)    Mobility walks with person assist     Focused Assessments Neuro Assessment Handoff:  Swallow screen pass? Yes          Neuro Assessment: Within Defined Limits Neuro Checks:  Last Documented NIHSS Modified Score:   Has TPA been given? No If patient is a Neuro Trauma and patient is going to OR before floor call report to [place]  nurse: [phone] or [phone]  , Pulmonary Assessment Handoff:  Lung sounds:   O2 Device: Room Air        R Recommendations: See Admitting Provider Note  Report given to:   Additional Notes:

## 2019-06-26 NOTE — Progress Notes (Signed)
Left lower extremity venous duplex has been completed. Preliminary results can be found in CV Proc through chart review.  Results were given to the patient's nurse, Hannie.  06/26/19 5:27 PM Meredith Gutierrez RVT

## 2019-06-26 NOTE — ED Notes (Signed)
Breakfast Ordered 

## 2019-06-26 NOTE — ED Notes (Signed)
Dr. Broadus John aware of positive DVT study

## 2019-06-26 NOTE — Progress Notes (Addendum)
Patient seen and examined admitted this morning by Dr. Marlowe Sax, please see her H&P for details.  Briefly Ms. Meredith Gutierrez is an 79 year old female with history of dementia, CAD/CABG, chronic diastolic CHF, chronic pain syndrome on morphine, who is cared for by her daughter at home was brought to the emergency room with fever, increased agitation. -In the ED her rapid Covid test was positive, she was febrile, slightly tachypneic but not hypoxic.  Chest x-ray showed scattered peripheral airspace opacities concerning for multifocal pneumonia.  SARS COVID-19 pneumonia -Continue IV remdesivir -Patient's daughter report history of allergies to steroids hence this was deferred -Supportive care -Monitor inflammatory markers -Not requiring oxygen at this time  Chronic diastolic CHF -She does have bilateral lower extremity edema, will resume Lasix  Clinically concern for dysphagia -Daughter was mashing and finally chopping up foods at home, change to dysphagia 3 diet, SLP evaluation  UTI versus asymptomatic bacteriuria -Given dementia unable to determine symptomatology -She was started on IV ceftriaxone this morning which will be continued, follow-up urine culture, low threshold to stop antibiotics  Type 2 diabetes mellitus -Holding oral hypoglycemics, continue sliding scale insulin  Dementia -At risk of worsening delirium  Chronic back pain/chronic pain syndrome -Continue home regimen of morphine  Continue DVT prophylaxis with Lovenox  Patient is a full code, this was confirmed with daughter, recommended further discussion with rest of the family  Domenic Polite, MD

## 2019-06-26 NOTE — Plan of Care (Addendum)
Much encouragement not to get up unassisted however patient with frequent attempts to get up unassisted. Telesitter started and pads to be placed at bedside. Low bed contraindicated due to hip surgery

## 2019-06-26 NOTE — ED Notes (Signed)
Pt daughter back from lunch, notified of pt's fall

## 2019-06-26 NOTE — ED Notes (Signed)
Breakfast tray ordered @ 0946-per Hannie, RN called by Levada Dy

## 2019-06-26 NOTE — ED Notes (Signed)
Silas [place], Agricultural consultant, notified of fall

## 2019-06-26 NOTE — ED Notes (Signed)
Pt CBG was 59, notified Hannie(RN)

## 2019-06-27 ENCOUNTER — Telehealth: Payer: Self-pay | Admitting: *Deleted

## 2019-06-27 LAB — CBC
HCT: 38.3 % (ref 36.0–46.0)
Hemoglobin: 12.7 g/dL (ref 12.0–15.0)
MCH: 28.5 pg (ref 26.0–34.0)
MCHC: 33.2 g/dL (ref 30.0–36.0)
MCV: 86.1 fL (ref 80.0–100.0)
Platelets: 311 10*3/uL (ref 150–400)
RBC: 4.45 MIL/uL (ref 3.87–5.11)
RDW: 14.4 % (ref 11.5–15.5)
WBC: 6.7 10*3/uL (ref 4.0–10.5)
nRBC: 0 % (ref 0.0–0.2)

## 2019-06-27 LAB — COMPREHENSIVE METABOLIC PANEL
ALT: 20 U/L (ref 0–44)
AST: 24 U/L (ref 15–41)
Albumin: 2.8 g/dL — ABNORMAL LOW (ref 3.5–5.0)
Alkaline Phosphatase: 71 U/L (ref 38–126)
Anion gap: 13 (ref 5–15)
BUN: 13 mg/dL (ref 8–23)
CO2: 21 mmol/L — ABNORMAL LOW (ref 22–32)
Calcium: 8.5 mg/dL — ABNORMAL LOW (ref 8.9–10.3)
Chloride: 108 mmol/L (ref 98–111)
Creatinine, Ser: 0.73 mg/dL (ref 0.44–1.00)
GFR calc Af Amer: >60 mL/min (ref >60)
GFR calc non Af Amer: >60 mL/min (ref >60)
Glucose, Bld: 109 mg/dL — ABNORMAL HIGH (ref 70–99)
Potassium: 3.9 mmol/L (ref 3.5–5.1)
Sodium: 142 mmol/L (ref 135–145)
Total Bilirubin: 1 mg/dL (ref 0.3–1.2)
Total Protein: 6.1 g/dL — ABNORMAL LOW (ref 6.5–8.1)

## 2019-06-27 LAB — C-REACTIVE PROTEIN: CRP: 10.1 mg/dL — ABNORMAL HIGH (ref <1.0)

## 2019-06-27 LAB — GLUCOSE, CAPILLARY
Glucose-Capillary: 89 mg/dL (ref 70–99)
Glucose-Capillary: 66 mg/dL — ABNORMAL LOW (ref 70–99)

## 2019-06-27 LAB — D-DIMER, QUANTITATIVE: D-Dimer, Quant: 1.46 ug/mL-FEU — ABNORMAL HIGH (ref 0.00–0.50)

## 2019-06-27 LAB — FERRITIN: Ferritin: 181 ng/mL (ref 11–307)

## 2019-06-27 MED ORDER — ENSURE ENLIVE PO LIQD: 237.0000 mL | Freq: Two times a day (BID) | ORAL | Status: DC

## 2019-06-27 MED ORDER — ADULT MULTIVITAMIN LIQUID CH
15.0000 mL | Freq: Every day | ORAL | Status: DC
  Filled 2019-06-27: qty 15

## 2019-06-27 MED ORDER — HALOPERIDOL LACTATE 5 MG/ML IJ SOLN
1.0000 mg | Freq: Four times a day (QID) | INTRAMUSCULAR | Status: DC | PRN
  Filled 2019-06-27: qty 1

## 2019-06-27 MED ORDER — APIXABAN 5 MG PO TABS: 5.0000 mg | ORAL_TABLET | Freq: Two times a day (BID) | ORAL | Status: DC

## 2019-06-27 MED ORDER — DEXTROSE 50 % IV SOLN
INTRAVENOUS | Status: AC
  Filled 2019-06-27: qty 50

## 2019-06-27 MED ORDER — HALOPERIDOL LACTATE 5 MG/ML IJ SOLN
1.0000 mg | Freq: Four times a day (QID) | INTRAMUSCULAR | Status: DC | PRN
  Administered 2019-06-27: 1 mg via INTRAMUSCULAR

## 2019-06-27 MED ORDER — APIXABAN 5 MG PO TABS: 10.0000 mg | ORAL_TABLET | Freq: Two times a day (BID) | ORAL | Status: DC

## 2019-06-27 MED ORDER — RIVAROXABAN (XARELTO) VTE STARTER PACK (15 & 20 MG): ORAL_TABLET | ORAL | 0 refills | Status: DC

## 2019-06-27 MED ORDER — FUROSEMIDE 40 MG PO TABS: 40.0000 mg | ORAL_TABLET | Freq: Every day | ORAL | Status: DC | PRN

## 2019-06-27 MED FILL — XARELTO STARTER PACK: 15 & 20 | 30 days supply | Qty: 51 | Fill #0

## 2019-06-27 NOTE — Telephone Encounter (Addendum)
Patient's daughter called stating that she has concerns that the [employer] that her mom started a few months back may be causing her to have more frequent UTI's. [name] stated that she had to take her mom back to the hospital the other day and she is being discharged today. Patient's daughter stated that her mom had covid, but quarantine time was over. See hospital notes.

## 2019-06-27 NOTE — Discharge Summary (Addendum)
Physician Discharge Summary  Meredith Gutierrez NID:[id number] DOB: 1941-04-25 DOA: 06/25/2019  PCP: Ria Bush, MD  Admit date: 06/25/2019 Discharge date: 06/27/2019  Time spent: 45 minutes  Recommendations for Outpatient Follow-up:  1. PCP in 1 week, please consider ongoing goals of care discussions with this patient and family, rediscuss CODE STATUS   Discharge Diagnoses:  Principal Problem:   Pneumonia due to COVID-19 virus Left leg DVT Alzheimer's dementia Severe agitation in the hospital   Chronic diastolic (congestive) heart failure ([place])   Diabetes mellitus type 2, uncontrolled, with complications ([place])   Acute lower UTI   Chronic back pain   Discharge Condition: Stable  Diet recommendation: Low-sodium, diabetic,  Filed Weights   06/26/19 1849 06/26/19 2206  Weight: 62.1 kg 61.3 kg    History of present illness:  Meredith Gutierrez is an 79 year old female with history of dementia, CAD/CABG, chronic diastolic CHF, chronic pain syndrome on morphine, who is cared for by her daughter at home was brought to the emergency room with fever, increased agitation. -In the ED her rapid Covid test was positive, she was febrile, slightly tachypneic but not hypoxic.  Chest x-ray showed scattered peripheral airspace opacities concerning for multifocal pneumonia.  Hospital Course:   Ethics: Patient is awake and alert, she is oriented to self and partly to place, she is insistent on going home, refuses meds, lab draws, currently in restraints to keep her in bed -Patient's daughter called staff nurse to see if she could take her home as this was an extremely stressful environment for her mother. -I called patient's daughter back, we discussed how restless angry and agitated being in the hospital was making her, she is awake and alert, refuses to take meds, refuses lab draws, requiring restraints, extremely anxious to go home, fortunately as far as her Covid is concerned she is not having fevers  no respiratory symptoms, not hypoxic -Under the circumstances daughter felt patient would be better off at home, which I agree with, unfortunately she will not get her complete course of IV remdesivir at the time of discharge and is allergic to steroids hence not on this as well -Advised PCP follow-up early next week  SARS COVID-19 pneumonia -Afebrile today, no respiratory symptoms, did have mild infiltrates on x-ray, completed 2 doses of remdesivir only -Patient's daughter report history of severe allergy to steroids hence this was deferred -Supportive care -Not requiring oxygen at this time -Patient's daughter elects to take her home as being in the hospital is making her extremely restless anxious and not comply with any medical care  Chronic diastolic CHF -She is on a regimen of as needed Lasix per her cardiologist which will be continued  Asymptomatic bacteriuria -Procalcitonin is low -No indication for antibiotics  Type 2 diabetes mellitus -Resume home regimen  Dementia -Follow-up with PCP, please consider goals of care discussion -I strongly recommended consideration of DNR to patient's daughter, she will discuss this further with her siblings  Chronic back pain/chronic pain syndrome -Continue home regimen of morphine  Discharge Exam: Vitals:   06/27/19 1025 06/27/19 1134  BP: (!) 228/170 (!) 148/89  Pulse: (!) 120 87  Resp: (!) 22 (!) 23  Temp: 98.4 F (36.9 C) 98.4 F (36.9 C)  SpO2: 91% 97%    General: Awake alert, oriented to self and partly to place only Cardiovascular: S1-S2, regular rate rhythm Respiratory: Diminished breath sounds the bases  Discharge Instructions   Discharge Instructions    Diet - low sodium heart healthy  Complete by: As directed    Increase activity slowly   Complete by: As directed      Allergies as of 06/27/2019      Reactions   Codeine Anaphylaxis, Shortness Of Breath   Prednisolone Acetate Shortness Of Breath,  Nausea Only   Prednisone Hives, Shortness Of Breath, Swelling   Nitroglycerin Other (See Comments)   Reaction not recalled    Metolazone Other (See Comments)   Hypokalemia, hyponatremia   Adhesive [tape] Rash, Other (See Comments)   SKIN IS VERY SENSITIVE- TEARS AND BRUISES EASILY, also   Crestor [rosuvastatin Calcium] Other (See Comments)   Weakness with statins   Lopressor [metoprolol Tartrate] Other (See Comments)   Bradycardia   Sulfamethoxazole Hives, Swelling   Zetia [ezetimibe] Other (See Comments)   Weakness      Medication List    STOP taking these medications   aspirin EC 81 MG tablet   polyethylene glycol 17 g packet Commonly known as: MIRALAX / GLYCOLAX   potassium chloride SA 20 MEQ tablet Commonly known as: KLOR-CON     TAKE these medications   Accu-Chek Aviva Plus w/Device Kit 1 each by Does not apply route daily. Use as directed to check blood sugar once daily.  Dx code:  E11.65   Accu-Chek FastClix Lancets Misc 1 each by Does not apply route daily. Use as directed to check blood sugar once daily.  Dx code:  E11.65   albuterol (2.5 MG/3ML) 0.083% nebulizer solution Commonly known as: PROVENTIL Take 3 mLs (2.5 mg total) by nebulization every 6 (six) hours as needed for wheezing or shortness of breath.   B-D SINGLE USE SWABS REGULAR Pads 1 each by Does not apply route daily. Use as directed to check blood sugar once daily.  Dx code:  E11.65   canagliflozin 100 MG Tabs tablet Commonly known as: INVOKANA Take 1 tablet (100 mg total) by mouth daily before breakfast.   furosemide 40 MG tablet Commonly known as: LASIX Take 1 tablet (40 mg total) by mouth daily as needed. Take 120 mg (3 tablets) once daily for a weight of 135 pounds and over and take 80 mg (2 tablets) once daily for a weight of 134 and below. What changed:   how much to take  how to take this  when to take this  reasons to take this   glucose blood test strip Commonly known as:  Accu-Chek Aviva Plus 1 each by Other route as needed for other. Use as instructed to check blood sugar once daily.  Dx code:  E11.65   LORazepam 0.5 MG tablet Commonly known as: ATIVAN TAKE 1 TABLET BY MOUTH DAILY AT BEDTIME AS NEEDED FOR ANXIETY OR SLEEP MAY TAKE SECOND 1/2 TABLET AS NEEDED What changed: See the new instructions.   metFORMIN 500 MG 24 hr tablet Commonly known as: GLUCOPHAGE-XR Take 2 tablets (1,000 mg total) by mouth daily with breakfast.   morphine 15 MG tablet Commonly known as: MSIR Take 15 mg by mouth every 6 (six) hours as needed for severe pain. What changed: Another medication with the same name was changed. Make sure you understand how and when to take each.   morphine 15 MG 12 hr tablet Commonly known as: MS CONTIN Take 1 tablet (15 mg total) by mouth See admin instructions. What changed: when to take this   Rivaroxaban 15 & 20 MG Tbpk Follow package directions: Take one 21m tablet by mouth twice a day. On day 22, switch to one  5m tablet once a day. Take with food.   Vitamin D 50 MCG (2000 UT) Caps Take 1 capsule (2,000 Units total) by mouth daily.      Allergies  Allergen Reactions  . Codeine Anaphylaxis and Shortness Of Breath  . Prednisolone Acetate Shortness Of Breath and Nausea Only  . Prednisone Hives, Shortness Of Breath and Swelling  . Nitroglycerin Other (See Comments)    Reaction not recalled   . Metolazone Other (See Comments)    Hypokalemia, hyponatremia  . Adhesive [Tape] Rash and Other (See Comments)    SKIN IS VERY SENSITIVE- TEARS AND BRUISES EASILY, also  . Crestor [Rosuvastatin Calcium] Other (See Comments)    Weakness with statins  . Lopressor [Metoprolol Tartrate] Other (See Comments)    Bradycardia   . Sulfamethoxazole Hives and Swelling  . Zetia [Ezetimibe] Other (See Comments)    Weakness       The results of significant diagnostics from this hospitalization (including imaging, microbiology, ancillary and  laboratory) are listed below for reference.    Significant Diagnostic Studies: DG Chest 2 View  Result Date: 06/25/2019 CLINICAL DATA:  Fever EXAM: CHEST - 2 VIEW COMPARISON:  April 29, 2019 FINDINGS: There are prominent bilateral interstitial lung markings with focal areas of lung space consolidation peripherally. The heart size remains enlarged. The patient is status post prior median sternotomy. There may be small bilateral pleural effusions. There are degenerative changes throughout the visualized thoracolumbar spine. There are degenerative changes of both glenohumeral joints. There is no pneumothorax. IMPRESSION: 1. Scattered peripheral airspace opacities concerning for multifocal pneumonia (viral or bacterial). 2. Prominent interstitial lung markings with cardiomegaly. Findings can be seen in patients with developing interstitial pulmonary edema. 3. Small bilateral pleural effusions. Electronically Signed   By: CConstance HolsterM.D.   On: 06/25/2019 17:48   VAS [place]OWER EXTREMITY VENOUS (DVT)  Result Date: 06/27/2019  Lower Venous Study Indications: Swelling.  Risk Factors: COVID 19 positive. Limitations: Poor ultrasound/tissue interface and patient positioning, patient movement. Comparison Study: Performing Technologist: GOliver HumRVT  Examination Guidelines: A complete evaluation includes B-mode imaging, spectral Doppler, color Doppler, and power Doppler as needed of all accessible portions of each vessel. Bilateral testing is considered an integral part of a complete examination. Limited examinations for reoccurring indications may be performed as noted.  +-----+---------------+---------+-----------+----------+--------------+ RIGHTCompressibilityPhasicitySpontaneityPropertiesThrombus Aging +-----+---------------+---------+-----------+----------+--------------+ CFV  Full           Yes      Yes                                  +-----+---------------+---------+-----------+----------+--------------+   +---------+---------------+---------+-----------+----------+--------------+ LEFT     CompressibilityPhasicitySpontaneityPropertiesThrombus Aging +---------+---------------+---------+-----------+----------+--------------+ CFV      Full           Yes      Yes                                 +---------+---------------+---------+-----------+----------+--------------+ SFJ      Full                                                        +---------+---------------+---------+-----------+----------+--------------+ FV Prox  Full                                                        +---------+---------------+---------+-----------+----------+--------------+  FV Mid   Full                                                        +---------+---------------+---------+-----------+----------+--------------+ FV DistalFull                                                        +---------+---------------+---------+-----------+----------+--------------+ PFV      Full                                                        +---------+---------------+---------+-----------+----------+--------------+ POP      None           No       No                   Acute          +---------+---------------+---------+-----------+----------+--------------+ PTV      Full                                                        +---------+---------------+---------+-----------+----------+--------------+ PERO                                                  Not visualized +---------+---------------+---------+-----------+----------+--------------+     Summary: Right: No evidence of common femoral vein obstruction. Left: Findings consistent with acute deep vein thrombosis involving the left popliteal vein. No cystic structure found in the popliteal fossa.  *See table(s) above for measurements and observations.  Electronically signed by Deitra Mayo MD on 06/27/2019 at 5:26:59 AM.    Final     Microbiology: Recent Results (from the past 240 hour(s))  Blood culture (routine x 2)     Status: None (Preliminary result)   Collection Time: 06/26/19  2:56 AM   Specimen: BLOOD LEFT HAND  Result Value Ref Range Status   Specimen Description BLOOD LEFT HAND  Final   Special Requests   Final    BOTTLES DRAWN AEROBIC AND ANAEROBIC Blood Culture results may not be optimal due to an inadequate volume of blood received in culture bottles   Culture   Final    NO GROWTH 1 DAY Performed at [facility], [place] [address] [place], [place] [address]    Report Status PENDING  Incomplete  Blood culture (routine x 2)     Status: None (Preliminary result)   Collection Time: 06/26/19  4:23 AM   Specimen: BLOOD  Result Value Ref Range Status   Specimen Description BLOOD RIGHT ANTECUBITAL  Final   Special Requests   Final    BOTTLES DRAWN AEROBIC AND ANAEROBIC Blood Culture results may not be optimal due to an inadequate volume  of blood received in culture bottles   Culture   Final    NO GROWTH 1 DAY Performed at [facility], [place] [address], [place], [place] [address]    Report Status PENDING  Incomplete     Labs: Basic Metabolic Panel: Recent Labs  Lab 06/25/19 1725 06/27/19 1033  NA 136 142  K 3.8 3.9  CL 108 108  CO2 18* 21*  GLUCOSE 100* 109*  BUN 14 13  CREATININE 0.65 0.73  CALCIUM 8.5* 8.5*   Liver Function Tests: Recent Labs  Lab 06/25/19 1725 06/27/19 1033  AST 34 24  ALT 34 20  ALKPHOS 69 71  BILITOT 0.8 1.0  PROT 6.2* 6.1*  ALBUMIN 2.9* 2.8*   No results for input(s): LIPASE, AMYLASE in the last 168 hours. No results for input(s): AMMONIA in the last 168 hours. CBC: Recent Labs  Lab 06/25/19 1725 06/27/19 1033  WBC 5.4 6.7  HGB 12.1 12.7  HCT 37.9 38.3  MCV 89.8 86.1  PLT 258 311   Cardiac Enzymes: No results for input(s): CKTOTAL, CKMB, CKMBINDEX,  TROPONINI in the last 168 hours. BNP: BNP (last 3 results) Recent Labs    06/26/19 0155  BNP 98.8    ProBNP (last 3 results) No results for input(s): PROBNP in the last 8760 hours.  CBG: Recent Labs  Lab 06/26/19 1245 06/26/19 1724 06/26/19 2223 06/27/19 1010 06/27/19 1212  GLUCAP 82 84 101* 89 66*       Signed:  Domenic Polite MD.  [facility] 06/27/2019, 12:40 PM

## 2019-06-27 NOTE — Progress Notes (Signed)
Initial Nutrition Assessment  INTERVENTION:   -Ensure Enlive po BID, each supplement provides 350 kcal and 20 grams of protein -Liquid MVI -Magic cup TID with meals, each supplement provides 290 kcal and 9 grams of protein  NUTRITION DIAGNOSIS:   Increased nutrient needs related to acute illness(COVID-19 infection) as evidenced by estimated needs.  GOAL:   Patient will meet greater than or equal to 90% of their needs  MONITOR:   PO intake, Supplement acceptance, Labs, Weight trends, I & O's  REASON FOR ASSESSMENT:   Consult Assessment of nutrition requirement/status  ASSESSMENT:   79 y.o. female with medical history significant of CAD status post CABG, dementia, diabetes mellitus, chronic diastolic congestive heart failure, hypertension, hyperlipidemia, and conditions listed below presenting to the ED for evaluation of fever.  No history could be obtained from the patient given her baseline dementia.  She has no complaints.  History obtained from the patient's daughter who explains that the patient has advanced dementia at baseline and is oriented to self only. Admitted for multifocal pneumonia secondary to COVID-19 viral infection.  **RD working remotely**  Patient with dementia, alert/oriented x 1. Per daughter, pt's appetite has been poor and she has to chop up the pt's foods given some swallowing difficulty. Pt is now on dysphagia 3 diet. Will order Ensure and Magic Cup supplements for meals for additional kcals and protein.   Per weight records, pt's weight fluctuates. Most likely related to CHF and dementia. Pt weighed ~ 139 lbs in January 2020. Current weight: 135 lbs.  I/Os:  +1.3L since admit  Medications: Vitamin C tablet, Lasix tablet, Zinc sulfate capsule Labs reviewed: CBGs: 89-101   NUTRITION - FOCUSED PHYSICAL EXAM:  Working remotely.  Diet Order:   Diet Order            DIET DYS 3 Room service appropriate? Yes; Fluid consistency: Thin  Diet effective  now              EDUCATION NEEDS:   No education needs have been identified at this time  Skin:  Skin Assessment: Reviewed RN Assessment  Last BM:  1/13 -type 4  Height:   Ht Readings from Last 1 Encounters:  06/26/19 5' (1.524 m)    Weight:   Wt Readings from Last 1 Encounters:  06/26/19 61.3 kg    Ideal Body Weight:  45.4 kg  BMI:  Body mass index is 26.39 kg/m.  Estimated Nutritional Needs:   Kcal:  1600-1800  Protein:  75-85g  Fluid:  1.8L/day  Clayton Bibles, MS, RD, LDN Inpatient Clinical Dietitian Pager: [phone] After Hours Pager: [phone]

## 2019-06-27 NOTE — Progress Notes (Signed)
Spoke with daughter re:  Discharge instructions.  Comprehension of instructions ascertained via "teach-back" technique.  Discharged via wheelchair to exit escorted by nurse tech.  Patient to be discharged to private residence to be cared for by daughter.

## 2019-06-27 NOTE — Plan of Care (Signed)
  Problem: Education: Goal: Knowledge of General Education information will improve Description: Including pain rating scale, medication(s)/side effects and non-pharmacologic comfort measures Outcome: Completed/Met   Problem: Pain Managment: Goal: General experience of comfort will improve Outcome: Completed/Met   Problem: Safety: Goal: Ability to remain free from injury will improve Outcome: Completed/Met   Problem: Education: Goal: Knowledge of risk factors and measures for prevention of condition will improve Outcome: Completed/Met   Problem: Coping: Goal: Psychosocial and spiritual needs will be supported Outcome: Completed/Met   Problem: Respiratory: Goal: Will maintain a patent airway Outcome: Completed/Met Goal: Complications related to the disease process, condition or treatment will be avoided or minimized Outcome: Completed/Met   Problem: Increased Nutrient Needs (NI-5.1) Goal: Food and/or nutrient delivery Description: Individualized approach for food/nutrient provision. Outcome: Completed/Met  Discharge instructions reviewed with daughter by phone, as patient lacks ability to comprehend what is being said to her.

## 2019-06-27 NOTE — ED Provider Notes (Signed)
[facility] Provider Note   CSN: [id number] Arrival date & time: 06/25/19  1626     History No chief complaint on file.   Meredith Gutierrez is a 79 y.o. female.  The history is provided by a relative. The history is limited by the condition of the patient.  Altered Mental Status Presenting symptoms: behavior changes and lethargy   Severity:  Mild Episode history:  Single Timing:  Constant Chronicity:  Recurrent Context: not alcohol use   Associated symptoms: decreased appetite and fever   Associated symptoms: no abdominal pain        Past Medical History:  Diagnosis Date  . Angina    never needed to take nitroglycerin.  . Anxiety   . Blood transfusion without reported diagnosis   . Cerebral atherosclerosis   . COPD (chronic obstructive pulmonary disease) ([place])    02-16-13-"pt. denies this"-shows on CXR of 2- 2013.  Marland Kitchen Coronary artery disease 2003   s/p CABG 2014  . Dementia ([place])   . Diabetes mellitus without complication ([place]) 49/1791   NEW ONSET  . Diastolic CHF, on admit treated. 08/10/2011  . Family history of adverse reaction to anesthesia    Dad and Sister had trouble waking up  . Fibromuscular dysplasia of renal artery (HCC)    right  . Heart murmur   . History of kidney stones 02-16-13   "hx. of overgrowth of muscle-causing some stricture"renal artery stenosis-being followed [facility]  . Hyperlipidemia   . Hypertension   . Hyperthyroidism    [place]  . Hyponatremia 04/2018  . Melanoma ([place]) 2010   skin cancers, 1 melanoma removed back  . Osteoarthritis     Patient Active Problem List   Diagnosis Date Noted  . Pneumonia due to COVID-19 virus 06/26/2019  . Acute lower UTI 06/26/2019  . Chronic back pain 06/26/2019  . Fever in adult   . SIRS (systemic inflammatory response syndrome) ([place]) 04/29/2019  . Pain and swelling of right elbow 01/19/2019  . Chronic pancreatitis ([place]) 01/01/2019  . Bacteremia due to group B Streptococcus  12/21/2018  . H/O total hip arthroplasty, left 12/21/2018  . Liver mass 12/20/2018  . Adrenal adenoma, left 12/20/2018  . Community acquired pneumonia of left lower lobe of lung 12/20/2018  . Coronary artery disease involving coronary bypass graft of native heart without angina pectoris 08/19/2018  . Recurrent boils 08/17/2018  . Acute pain of left shoulder 08/17/2018  . Hamartoma ([place]) 08/02/2018  . Atherosclerosis of aorta ([place]) 06/29/2018  . Diabetes mellitus type 2, uncontrolled, with complications ([place]) 50/56/9794  . Systolic murmur 80/16/5537  . COPD (chronic obstructive pulmonary disease) ([place]) 04/20/2018  . Medicare annual wellness visit, subsequent 04/07/2018  . Chronic nasal congestion 04/03/2018  . Multiple atypical nevi 02/08/2018  . Genital warts 02/08/2018  . Sundowning 02/08/2018  . Pulmonary nodules 12/13/2017  . Esophageal dysphagia 09/07/2017  . Health maintenance examination 04/13/2017  . Decreased hearing, bilateral 04/13/2017  . Advanced care planning/counseling discussion 04/09/2017  . Vitamin D deficiency 02/14/2017  . Fibromuscular dysplasia of renal artery ([place])   . Chronic lower back pain 02/09/2017  . Dementia ([place]) 02/09/2017  . History of melanoma 02/09/2017  . Hyperthyroidism   . Chronic diastolic (congestive) heart failure ([place]) 08/10/2011  . Dyslipidemia (statin intolerance) associated with type 2 diabetes mellitus ([place]) 08/07/2011  . Sinus bradycardia, beta blocker intol. 08/07/2011  . Chest pain, negative MI, cath with atretic LIMA, and excellent flow down the native LAD,patent  grafts otherwise 08/05/2011  . Essential hypertension 08/05/2011  . Ex-smoker 08/05/2011  . Hx of CABG 08/05/2011    Past Surgical History:  Procedure Laterality Date  . ABDOMINAL HYSTERECTOMY  1970   heavy bleeding  . APPENDECTOMY  1960s  . CARDIAC CATHETERIZATION  08/09/2011   no sign. coronary obstruction. LIMA is atretic w/excellent flow down the native LAD  .  CARDIAC CATHETERIZATION  08/22/2001   no sign. coronary stenosis,  . CATARACT EXTRACTION, BILATERAL Bilateral   . CESAREAN SECTION    . CORONARY ARTERY BYPASS GRAFT  02-16-13   3'03-no bypasses -Had 2 aneurysm repairs(stents used)  . ESOPHAGOGASTRODUODENOSCOPY  04/2018   LA Grade A esophagitis, small nodule distal esophagus (benign schwann cell hamartoma), empiric dilation to 107m ([place])  . LAPAROTOMY N/A 02/20/2013   Procedure: EXPLORATORY LAPAROTOMY;  WJanie Morning MD  . LEFT HEART CATHETERIZATION WITH CORONARY ANGIOGRAM N/A 08/09/2011   Procedure: LEFT HEART CATHETERIZATION WITH CORONARY ANGIOGRAM;  Surgeon: MSanda Klein MD;  [facility];  Service: Cardiovascular;  Laterality: N/A;  . NM MYOCAR PERF WALL MOTION  07/20/2010   normal  . RADIOLOGY WITH ANESTHESIA N/A 02/22/2019   Procedure: MRI OF ABDOMEN WITH AND WITHOUT CONTRAST;  Surgeon: Radiologist, Medication, MD;  [facility]  Service: Radiology;  Laterality: N/A;  . SALPINGOOPHORECTOMY Bilateral 02/20/2013   benign seromucinous cystadenofibroma R ovary ([name] MD)  . TOOTH EXTRACTION    . TOTAL HIP ARTHROPLASTY Left 2015     OB History   No obstetric history on file.     Family History  Problem Relation Age of Onset  . Stroke Mother   . Hypotension Mother   . Dementia Mother 610 . Coronary artery disease Father   . Hypertension Father   . Breast cancer Maternal Aunt   . Hypotension Sister   . Hypertension Brother   . Diabetes Brother   . Lung cancer Daughter   . Thyroid cancer Daughter   . Colon polyps Maternal Grandfather   . Colon cancer Neg Hx   . Esophageal cancer Neg Hx   . Stomach cancer Neg Hx   . Rectal cancer Neg Hx     Social History   Tobacco Use  . Smoking status: Former Smoker    Packs/day: 0.10    Years: 20.00    Pack years: 2.00    Types: Cigarettes    Quit date: 09/14/2017    Years since quitting: 1.7  . Smokeless tobacco: Never Used  . Tobacco comment: smoking  cessation info given  Substance Use Topics  . Alcohol use: No  . Drug use: No    Home Medications Prior to Admission medications   Medication Sig Start Date End Date Taking? Authorizing Provider  albuterol (PROVENTIL) (2.5 MG/3ML) 0.083% nebulizer solution Take 3 mLs (2.5 mg total) by nebulization every 6 (six) hours as needed for wheezing or shortness of breath. 12/13/17  Yes GRia Bush MD  aspirin EC 81 MG tablet Take 81 mg by mouth daily.   Yes [provider]  canagliflozin (INVOKANA) 100 MG TABS tablet Take 1 tablet (100 mg total) by mouth daily before breakfast. 03/05/19  Yes GRia Bush MD  Cholecalciferol (VITAMIN D) 2000 units CAPS Take 1 capsule (2,000 Units total) by mouth daily. 04/13/17  Yes GRia Bush MD  LORazepam (ATIVAN) 0.5 MG tablet TAKE 1 TABLET BY MOUTH DAILY AT BEDTIME AS NEEDED FOR ANXIETY OR SLEEP MAY TAKE SECOND 1/2 TABLET AS NEEDED Patient taking differently: Take  0.5 mg by mouth at bedtime as needed for anxiety or sleep.  06/18/19  Yes Ria Bush, MD  metFORMIN (GLUCOPHAGE-XR) 500 MG 24 hr tablet Take 2 tablets (1,000 mg total) by mouth daily with breakfast. 03/05/19  Yes Ria Bush, MD  morphine (MS CONTIN) 15 MG 12 hr tablet Take 1 tablet (15 mg total) by mouth See admin instructions. Patient taking differently: Take 15 mg by mouth every 12 (twelve) hours.  05/01/19  Yes Gladys Damme, MD  morphine (MSIR) 15 MG tablet Take 15 mg by mouth every 6 (six) hours as needed for severe pain.   Yes [provider]  ACCU-CHEK FASTCLIX LANCETS MISC 1 each by Does not apply route daily. Use as directed to check blood sugar once daily.  Dx code:  E11.65 05/02/18   Ria Bush, MD  Alcohol Swabs (B-D SINGLE USE SWABS REGULAR) PADS 1 each by Does not apply route daily. Use as directed to check blood sugar once daily.  Dx code:  E11.65 05/02/18   Ria Bush, MD  Blood Glucose Monitoring Suppl (ACCU-CHEK AVIVA PLUS)  w/Device KIT 1 each by Does not apply route daily. Use as directed to check blood sugar once daily.  Dx code:  E11.65 05/02/18   Ria Bush, MD  furosemide (LASIX) 40 MG tablet Take 120 mg (3 tablets) once daily for a weight of 135 pounds and over and take 80 mg (2 tablets) once daily for a weight of 134 and below. Patient not taking: Reported on 06/26/2019 02/14/19   Croitoru, Mihai, MD  glucose blood (ACCU-CHEK AVIVA PLUS) test strip 1 each by Other route as needed for other. Use as instructed to check blood sugar once daily.  Dx code:  E11.65 05/02/18   Ria Bush, MD  polyethylene glycol (MIRALAX / GLYCOLAX) 17 g packet Take 17 g by mouth daily as needed. Patient not taking: Reported on 06/26/2019 12/23/18   Mariel Aloe, MD  potassium chloride SA (KLOR-CON) 20 MEQ tablet Take 1 tablet (20 mEq total) by mouth daily. Patient not taking: Reported on 06/26/2019 05/17/19   Ria Bush, MD    Allergies    Codeine, Prednisolone acetate, Prednisone, Nitroglycerin, Metolazone, Adhesive [tape], Crestor [rosuvastatin calcium], Lopressor [metoprolol tartrate], Sulfamethoxazole, and Zetia [ezetimibe]  Review of Systems   Review of Systems  Unable to perform ROS: Dementia  Constitutional: Positive for decreased appetite and fever.  Gastrointestinal: Negative for abdominal pain.    Physical Exam Updated Vital Signs BP (!) 142/112   Pulse 91   Temp (!) 97.5 F (36.4 C) (Oral)   Resp (!) 24   Ht 5' (1.524 m)   Wt 61.3 kg   SpO2 97%   BMI 26.39 kg/m   Physical Exam Vitals and nursing note reviewed.  Constitutional:      Appearance: She is well-developed.  HENT:     Head: Normocephalic and atraumatic.  Eyes:     Extraocular Movements: Extraocular movements intact.     Conjunctiva/sclera: Conjunctivae normal.  Cardiovascular:     Rate and Rhythm: Normal rate and regular rhythm.  Pulmonary:     Effort: No respiratory distress.     Breath sounds: No stridor.  Abdominal:       General: There is no distension.  Musculoskeletal:        General: No swelling or tenderness. Normal range of motion.     Cervical back: Normal range of motion.  Skin:    General: Skin is warm and dry.  Neurological:  General: No focal deficit present.     Mental Status: She is alert.     ED Results / Procedures / Treatments   Labs (all labs ordered are listed, but only abnormal results are displayed) Labs Reviewed  COMPREHENSIVE METABOLIC PANEL - Abnormal; Notable for the following components:      Result Value   CO2 18 (*)    Glucose, Bld 100 (*)    Calcium 8.5 (*)    Total Protein 6.2 (*)    Albumin 2.9 (*)    All other components within normal limits  URINALYSIS, ROUTINE W REFLEX MICROSCOPIC - Abnormal; Notable for the following components:   APPearance HAZY (*)    Glucose, UA >=500 (*)    Leukocytes,Ua LARGE (*)    Bacteria, UA FEW (*)    All other components within normal limits  HEMOGLOBIN A1C - Abnormal; Notable for the following components:   Hgb A1c MFr Bld 6.9 (*)    All other components within normal limits  C-REACTIVE PROTEIN - Abnormal; Notable for the following components:   CRP 8.5 (*)    All other components within normal limits  D-DIMER, QUANTITATIVE (NOT AT [facility]) - Abnormal; Notable for the following components:   D-Dimer, Quant 1.64 (*)    All other components within normal limits  FIBRINOGEN - Abnormal; Notable for the following components:   Fibrinogen 618 (*)    All other components within normal limits  GLUCOSE, CAPILLARY - Abnormal; Notable for the following components:   Glucose-Capillary 101 (*)    All other components within normal limits  POC SARS CORONAVIRUS 2 AG -  ED - Abnormal; Notable for the following components:   SARS Coronavirus 2 Ag POSITIVE (*)    All other components within normal limits  CBG MONITORING, ED - Abnormal; Notable for the following components:   Glucose-Capillary 59 (*)    All other components within  normal limits  CBG MONITORING, ED - Abnormal; Notable for the following components:   Glucose-Capillary 110 (*)    All other components within normal limits  CULTURE, BLOOD (ROUTINE X 2)  CULTURE, BLOOD (ROUTINE X 2)  URINE CULTURE  CBC  LACTIC ACID, PLASMA  BRAIN NATRIURETIC PEPTIDE  FERRITIN  PROCALCITONIN  CBC  COMPREHENSIVE METABOLIC PANEL  C-REACTIVE PROTEIN  FERRITIN  D-DIMER, QUANTITATIVE (NOT AT [facility])  CBG MONITORING, ED  CBG MONITORING, ED  ABO/RH  TROPONIN I (HIGH SENSITIVITY)    EKG EKG Interpretation  Date/Time:  Tuesday June 26 2019 04:00:14 EST Ventricular Rate:  87 PR Interval:    QRS Duration: 93 QT Interval:  369 QTC Calculation: 444 R Axis:   -2 Text Interpretation: Sinus rhythm Low voltage, precordial leads Consider anterior infarct When compared with ECG of EARLIER SAME DATE No significant change was found Confirmed by Delora Fuel ([id number]) on 06/26/2019 6:08:13 AM Also confirmed by Delora Fuel ([id number]), editor Hattie Perch [id number])  on 06/26/2019 9:29:54 AM   Radiology DG Chest 2 View  Result Date: 06/25/2019 CLINICAL DATA:  Fever EXAM: CHEST - 2 VIEW COMPARISON:  April 29, 2019 FINDINGS: There are prominent bilateral interstitial lung markings with focal areas of lung space consolidation peripherally. The heart size remains enlarged. The patient is status post prior median sternotomy. There may be small bilateral pleural effusions. There are degenerative changes throughout the visualized thoracolumbar spine. There are degenerative changes of both glenohumeral joints. There is no pneumothorax. IMPRESSION: 1. Scattered peripheral airspace opacities concerning for multifocal pneumonia (viral or bacterial). 2. Prominent  interstitial lung markings with cardiomegaly. Findings can be seen in patients with developing interstitial pulmonary edema. 3. Small bilateral pleural effusions. Electronically Signed   By: Constance Holster M.D.   On: 06/25/2019  17:48   VAS [place] LOWER EXTREMITY VENOUS (DVT)  Result Date: 06/27/2019  Lower Venous Study Indications: Swelling.  Risk Factors: COVID 19 positive. Limitations: Poor ultrasound/tissue interface and patient positioning, patient movement. Comparison Study: Performing Technologist: Oliver Hum RVT  Examination Guidelines: A complete evaluation includes B-mode imaging, spectral Doppler, color Doppler, and power Doppler as needed of all accessible portions of each vessel. Bilateral testing is considered an integral part of a complete examination. Limited examinations for reoccurring indications may be performed as noted.  +-----+---------------+---------+-----------+----------+--------------+ RIGHTCompressibilityPhasicitySpontaneityPropertiesThrombus Aging +-----+---------------+---------+-----------+----------+--------------+ CFV  Full           Yes      Yes                                 +-----+---------------+---------+-----------+----------+--------------+   +---------+---------------+---------+-----------+----------+--------------+ LEFT     CompressibilityPhasicitySpontaneityPropertiesThrombus Aging +---------+---------------+---------+-----------+----------+--------------+ CFV      Full           Yes      Yes                                 +---------+---------------+---------+-----------+----------+--------------+ SFJ      Full                                                        +---------+---------------+---------+-----------+----------+--------------+ FV Prox  Full                                                        +---------+---------------+---------+-----------+----------+--------------+ FV Mid   Full                                                        +---------+---------------+---------+-----------+----------+--------------+ FV DistalFull                                                         +---------+---------------+---------+-----------+----------+--------------+ PFV      Full                                                        +---------+---------------+---------+-----------+----------+--------------+ POP      None           No       No                   Acute          +---------+---------------+---------+-----------+----------+--------------+  PTV      Full                                                        +---------+---------------+---------+-----------+----------+--------------+ PERO                                                  Not visualized +---------+---------------+---------+-----------+----------+--------------+     Summary: Right: No evidence of common femoral vein obstruction. Left: Findings consistent with acute deep vein thrombosis involving the left popliteal vein. No cystic structure found in the popliteal fossa.  *See table(s) above for measurements and observations. Electronically signed by Deitra Mayo MD on 06/27/2019 at 5:26:59 AM.    Final     Procedures Procedures (including critical care time)  Medications Ordered in ED Medications  aspirin EC tablet 81 mg (81 mg Oral Given 06/26/19 0945)  morphine (MS CONTIN) 12 hr tablet 15 mg (15 mg Oral Given 06/26/19 2241)  morphine (MSIR) tablet 15 mg (15 mg Oral Given 06/27/19 0442)  LORazepam (ATIVAN) tablet 0.5 mg (0.5 mg Oral Given 06/26/19 2104)  insulin aspart (novoLOG) injection 0-9 Units (0 Units Subcutaneous Not Given 06/26/19 1727)  insulin aspart (novoLOG) injection 0-5 Units (0 Units Subcutaneous Not Given 06/26/19 2224)  remdesivir 200 mg in sodium chloride 0.9% 250 mL IVPB (0 mg Intravenous Stopped 06/26/19 1012)    Followed by  remdesivir 100 mg in sodium chloride 0.9 % 100 mL IVPB (has no administration in time range)  albuterol (VENTOLIN HFA) 108 (90 Base) MCG/ACT inhaler 2 puff (has no administration in time range)  guaiFENesin-dextromethorphan (ROBITUSSIN DM)  100-10 MG/5ML syrup 10 mL (has no administration in time range)  ascorbic acid (VITAMIN C) tablet 500 mg (500 mg Oral Not Given 06/26/19 0946)  zinc sulfate capsule 220 mg (220 mg Oral Given 06/26/19 0945)  acetaminophen (TYLENOL) tablet 650 mg (has no administration in time range)  cefTRIAXone (ROCEPHIN) 1 g in sodium chloride 0.9 % 100 mL IVPB (1 g Intravenous New Bag/Given 06/27/19 0353)  acetaminophen (TYLENOL) suppository 650 mg (650 mg Rectal Given 06/26/19 1232)  furosemide (LASIX) tablet 40 mg (40 mg Oral Given 06/26/19 1237)  enoxaparin (LOVENOX) injection 60 mg (60 mg Subcutaneous Given 06/26/19 2104)  cefTRIAXone (ROCEPHIN) 1 g in sodium chloride 0.9 % 100 mL IVPB (0 g Intravenous Stopped 06/26/19 0530)  azithromycin (ZITHROMAX) 500 mg in sodium chloride 0.9 % 250 mL IVPB (0 mg Intravenous Stopped 06/26/19 0630)  lactated ringers bolus 500 mL (0 mLs Intravenous Stopped 06/26/19 0846)  LORazepam (ATIVAN) tablet 0.5 mg (0.5 mg Oral Given 06/26/19 0237)  potassium chloride SA (KLOR-CON) CR tablet 40 mEq (40 mEq Oral Given 06/26/19 1240)    ED Course  I have reviewed the triage vital signs and the nursing notes.  Pertinent labs & imaging results that were available during my care of the patient were reviewed by me and considered in my medical decision making (see chart for details).    MDM Rules/Calculators/A&P                      Found to have likely UTI and also covid/PNA. Altered. Will admit   Final  Clinical Impression(s) / ED Diagnoses Final diagnoses:  Delirium  COVID-19  Urinary tract infection without hematuria, site unspecified    Rx / DC Orders ED Discharge Orders    None       Kaleigha Chamberlin, Corene Cornea, MD 06/27/19 [phone]

## 2019-06-27 NOTE — Progress Notes (Signed)
Confusion / delirium has increased.  Pt has tele-monitor for safety, however has pulled out all IV lines and continues to risk injury to herself, through no fault of her own.  Bil wrist restraints applied as ordered as well as posey waist restraints.  Daughter called and notified of application and rationale.

## 2019-06-27 NOTE — Progress Notes (Addendum)
[place] for Lovenox >> Eliquis Indication: DVT  Allergies  Allergen Reactions  . Codeine Anaphylaxis and Shortness Of Breath  . Prednisolone Acetate Shortness Of Breath and Nausea Only  . Prednisone Hives, Shortness Of Breath and Swelling  . Nitroglycerin Other (See Comments)    Reaction not recalled   . Metolazone Other (See Comments)    Hypokalemia, hyponatremia  . Adhesive [Tape] Rash and Other (See Comments)    SKIN IS VERY SENSITIVE- TEARS AND BRUISES EASILY, also  . Crestor [Rosuvastatin Calcium] Other (See Comments)    Weakness with statins  . Lopressor [Metoprolol Tartrate] Other (See Comments)    Bradycardia   . Sulfamethoxazole Hives and Swelling  . Zetia [Ezetimibe] Other (See Comments)    Weakness     Patient Measurements: Height: 5' (152.4 cm) Weight: 135 lb 2.3 oz (61.3 kg) IBW/kg (Calculated) : 45.5  Vital Signs: Temp: 98.4 F (36.9 C) (01/13 1134) Temp Source: Oral (01/13 1134) BP: 148/89 (01/13 1134) Pulse Rate: 87 (01/13 1134)  Labs: Recent Labs    06/25/19 1725 06/26/19 0206 06/27/19 1033  HGB 12.1  --  12.7  HCT 37.9  --  38.3  PLT 258  --  311  CREATININE 0.65  --  0.73  TROPONINIHS  --  5  --     Estimated Creatinine Clearance: 45.9 mL/min (by C-G formula based on SCr of 0.73 mg/dL).   Assessment: 60 yof that is being admitted for COVID and was found to have a DVT after ultrasound was completed. Pharmacy has been asked to transition patient from Lovenox to Eliquis.  Patient didn't receive Lovenox this AM and last dose was on 06/26/19 around 2100.  No bleeding reported.  Goal of Therapy:  Appropriate anticoagulation Monitor platelets by anticoagulation protocol: Yes   Plan:  Eliquis 10mg  PO BID x 7 days, then 5mg  PO BID.  To be given prior to discharge if discharging. Pharmacy will sign off and follow peripherally.  Thank you for the consult!  Jazmon Kos D. [name], PharmD, BCPS, [place] 06/27/2019,  12:48 PM  =============================  Addendum: Patient to be discharged home on Xarelto, not Eliquis Per MD, patient received a dose of Lovenox this AM but order was charted late )confirmed)  Zakeria Kulzer D. [name], PharmD, BCPS, [place] 06/27/2019, 1:17 PM

## 2019-06-28 ENCOUNTER — Telehealth: Payer: Self-pay

## 2019-06-28 NOTE — Telephone Encounter (Signed)
Transition Care Management Follow-up Telephone Call  Date of discharge and from where: 06/27/2019, [facility]  How have you been since you were released from the hospital? Patient is doing okay. No fever or cough noted. Patient is feeling better.   Any questions or concerns? No   Items Reviewed:  Did the pt receive and understand the discharge instructions provided? Yes   Medications obtained and verified? Yes   Any new allergies since your discharge? No   Dietary orders reviewed? Yes  Do you have support at home? Yes   Functional Questionnaire: (I = Independent and D = Dependent) ADLs: I  Bathing/Dressing- D  Meal Prep- I  Eating- I  Maintaining continence- I  Transferring/Ambulation- I  Managing Meds- D  Follow up appointments reviewed:   PCP Hospital f/u appt confirmed? Yes  Scheduled to see Dr. Danise Mina on 07/06/2019 @ 9:30 am virtually.  [facility] f/u appt confirmed? N/A   Are transportation arrangements needed? No   If their condition worsens, is the pt aware to call PCP or go to the Emergency Dept.? Yes  Was the patient provided with contact information for the PCP's office or ED? Yes  Was to pt encouraged to call back with questions or concerns? Yes

## 2019-06-29 NOTE — Telephone Encounter (Signed)
Records reviewed. Fortunately blood cultures returned ok. Urine had some white cells but hospitalist didn't think that it was a UTI.  She did test positive for covid - how are covid symptoms at this time? plz place on covid call list to check on her again on Monday.

## 2019-07-01 LAB — CULTURE, BLOOD (ROUTINE X 2)
Culture: NO GROWTH
Culture: NO GROWTH

## 2019-07-02 NOTE — Telephone Encounter (Signed)
Spoke with pt's daughter, [name] (on dpr), relaying Dr. Synthia Innocent message.  Verbalizes understanding.  Per [name], pt is doing better.  No more fevers since 06/27/19.  She's more active, still has better appetite and is drinking.  Fyi to Dr. Darnell Level.

## 2019-07-04 ENCOUNTER — Telehealth: Payer: Self-pay | Admitting: Cardiovascular Disease

## 2019-07-04 NOTE — Telephone Encounter (Signed)
I would recommend holding off on restarting the blood pressure medicines for about a week or 2 as long as she remains asymptomatic.  Let's let her get over the viral infection before we make a decision.

## 2019-07-04 NOTE — Telephone Encounter (Signed)
New Message  Pt c/o BP issue: STAT if pt c/o blurred vision, one-sided weakness or slurred speech  1. What are your last 5 BP readings? 115/68, 168/80, 150/70,   2. Are you having any other symptoms (ex. Dizziness, headache, blurred vision, passed out)? No  3. What is your BP issue? Patient has had Covid-19 and has just recently had elevated blood pressure, no other symptoms. Patient's daughter would like to know should she start back giving her candesartan 16 mg. Please call and advise.

## 2019-07-04 NOTE — Telephone Encounter (Signed)
Spoke with pt's daughter who report pt was recently dx with COVID and has since noticed and increases in BP.  1/15- 122/68 1/16- 152/78 1/19- 160/88 1/20- 179/77  She report pt is asymptomatic but inquiring if MD recommends she restart candesartan 8 mg daily.

## 2019-07-05 ENCOUNTER — Encounter: Payer: Self-pay | Admitting: Cardiovascular Disease

## 2019-07-05 NOTE — Telephone Encounter (Signed)
Left a message for the patient to call back.  

## 2019-07-05 NOTE — Telephone Encounter (Signed)
error 

## 2019-07-05 NOTE — Telephone Encounter (Signed)
Left a message for the patient's daughter to call back.  

## 2019-07-05 NOTE — Progress Notes (Deleted)
This visit was conducted in person.  There were no vitals taken for this visit.   CC: hosp f/u visit Subjective:    Patient ID: [name], female    DOB: Oct 24, 1940, 79 y.o.   MRN: [id number]  HPI: ARILLA HICE is a 79 y.o. female presenting on 07/06/2019 for No chief complaint on file.   Recent hospitalization for Covid-19 pneumonia presenting with fever and agitation treated with incomplete remdesivir course (see below). Decadron was not administered due to steroid allergy. Chest xray showed multifocal pneumonia. Significant agitation while hospitalized, to the point of necessitating early discharge (refusing meds, lab draws, needed restraints). She was found to have bacteria in urine however this was felt to be colonization rather than infection as her procalcitonin level was low. UCx not sent. Blood cultures were negative x2.  Admit date: 06/25/2019 Discharge date: 06/27/2019 TCM hosp f/u phone call completed: 06/28/2019  Recommendations for Outpatient Follow-up:  1. PCP in 1 week, please consider ongoing goals of care discussions with this patient and family, rediscuss CODE STATUS  Discharge Diagnoses:  Principal Problem:   Pneumonia due to COVID-19 virus Left leg DVT Alzheimer's dementia Severe agitation in the hospital   Chronic diastolic (congestive) heart failure (HCC)   Diabetes mellitus type 2, uncontrolled, with complications ([place])   Acute lower UTI   Chronic back pain  Discharge Condition: Stable Diet recommendation: Low-sodium, diabetic     Relevant past medical, surgical, family and social history reviewed and updated as indicated. Interim medical history since our last visit reviewed. Allergies and medications reviewed and updated. Outpatient Medications Prior to Visit  Medication Sig Dispense Refill  . ACCU-CHEK FASTCLIX LANCETS MISC 1 each by Does not apply route daily. Use as directed to check blood sugar once daily.  Dx code:  E11.65 100 each 0   . albuterol (PROVENTIL) (2.5 MG/3ML) 0.083% nebulizer solution Take 3 mLs (2.5 mg total) by nebulization every 6 (six) hours as needed for wheezing or shortness of breath. 75 mL 1  . Alcohol Swabs (B-D SINGLE USE SWABS REGULAR) PADS 1 each by Does not apply route daily. Use as directed to check blood sugar once daily.  Dx code:  E11.65 100 each 0  . Blood Glucose Monitoring Suppl (ACCU-CHEK AVIVA PLUS) w/Device KIT 1 each by Does not apply route daily. Use as directed to check blood sugar once daily.  Dx code:  E11.65 1 kit 0  . canagliflozin (INVOKANA) 100 MG TABS tablet Take 1 tablet (100 mg total) by mouth daily before breakfast. 30 tablet 6  . Cholecalciferol (VITAMIN D) 2000 units CAPS Take 1 capsule (2,000 Units total) by mouth daily. 30 capsule   . furosemide (LASIX) 40 MG tablet Take 1 tablet (40 mg total) by mouth daily as needed. Take 120 mg (3 tablets) once daily for a weight of 135 pounds and over and take 80 mg (2 tablets) once daily for a weight of 134 and below.    Marland Kitchen glucose blood (ACCU-CHEK AVIVA PLUS) test strip 1 each by Other route as needed for other. Use as instructed to check blood sugar once daily.  Dx code:  E11.65 100 each 0  . LORazepam (ATIVAN) 0.5 MG tablet TAKE 1 TABLET BY MOUTH DAILY AT BEDTIME AS NEEDED FOR ANXIETY OR SLEEP MAY TAKE SECOND 1/2 TABLET AS NEEDED (Patient taking differently: Take 0.5 mg by mouth at bedtime as needed for anxiety or sleep. ) 45 tablet 0  . metFORMIN (GLUCOPHAGE-XR) 500 MG 24  hr tablet Take 2 tablets (1,000 mg total) by mouth daily with breakfast. 180 tablet 1  . morphine (MS CONTIN) 15 MG 12 hr tablet Take 1 tablet (15 mg total) by mouth See admin instructions. (Patient taking differently: Take 15 mg by mouth every 12 (twelve) hours. ) 30 tablet 0  . morphine (MSIR) 15 MG tablet Take 15 mg by mouth every 6 (six) hours as needed for severe pain.    . Rivaroxaban 15 & 20 MG TBPK Follow package directions: Take one 53m tablet by mouth twice a  day. On day 22, switch to one 221mtablet once a day. Take with food. 51 each 0   No facility-administered medications prior to visit.     Per HPI unless specifically indicated in ROS section below Review of Systems Objective:    There were no vitals taken for this visit.  Wt Readings from Last 3 Encounters:  06/26/19 135 lb 2.3 oz (61.3 kg)  05/09/19 133 lb 2 oz (60.4 kg)  05/09/19 132 lb 12.8 oz (60.2 kg)    Physical Exam    Results for orders placed or performed during the hospital encounter of 06/25/19  Blood culture (routine x 2)   Specimen: BLOOD LEFT HAND  Result Value Ref Range   Specimen Description BLOOD LEFT HAND    Special Requests      BOTTLES DRAWN AEROBIC AND ANAEROBIC Blood Culture results may not be optimal due to an inadequate volume of blood received in culture bottles   Culture      NO GROWTH 5 DAYS Performed at [facility] [place] [id number]  Report Status 07/01/2019 FINAL   Blood culture (routine x 2)   Specimen: BLOOD  Result Value Ref Range   Specimen Description BLOOD RIGHT ANTECUBITAL    Special Requests      BOTTLES DRAWN AEROBIC AND ANAEROBIC Blood Culture results may not be optimal due to an inadequate volume of blood received in culture bottles   Culture      NO GROWTH 5 DAYS Performed at [facility] [place] [id number]  Report Status 07/01/2019 FINAL   CBC  Result Value Ref Range   WBC 5.4 4.0 - 10.5 K/uL   RBC 4.22 3.87 - 5.11 MIL/uL   Hemoglobin 12.1 12.0 - 15.0 g/dL   HCT 37.9 36.0 - 46.0 %   MCV 89.8 80.0 - 100.0 fL   MCH 28.7 26.0 - 34.0 pg   MCHC 31.9 30.0 - 36.0 g/dL   RDW 14.9 11.5 - 15.5 %   Platelets 258 150 - 400 K/uL   nRBC 0.0 0.0 - 0.2 %  Comprehensive metabolic panel  Result Value Ref Range   Sodium 136 135 - 145 mmol/L   Potassium 3.8 3.5 - 5.1 mmol/L   Chloride 108 98 - 111 mmol/L   CO2 18 (L) 22 - 32 mmol/L   Glucose, Bld 100 (H) 70 - 99 mg/dL    BUN 14 8 - 23 mg/dL   Creatinine, Ser 0.65 0.44 - 1.00 mg/dL   Calcium 8.5 (L) 8.9 - 10.3 mg/dL   Total Protein 6.2 (L) 6.5 - 8.1 g/dL   Albumin 2.9 (L) 3.5 - 5.0 g/dL   AST 34 15 - 41 U/L   ALT 34 0 - 44 U/L   Alkaline Phosphatase 69 38 - 126 U/L   Total Bilirubin 0.8 0.3 - 1.2 mg/dL  GFR calc non Af Amer >60 >60 mL/min   GFR calc Af Amer >60 >60 mL/min   Anion gap 10 5 - 15  Urinalysis, Routine w reflex microscopic  Result Value Ref Range   Color, Urine YELLOW YELLOW   APPearance HAZY (A) CLEAR   Specific Gravity, Urine 1.026 1.005 - 1.030   pH 5.0 5.0 - 8.0   Glucose, UA >=500 (A) NEGATIVE mg/dL   Hgb urine dipstick NEGATIVE NEGATIVE   Bilirubin Urine NEGATIVE NEGATIVE   Ketones, ur NEGATIVE NEGATIVE mg/dL   Protein, ur NEGATIVE NEGATIVE mg/dL   Nitrite NEGATIVE NEGATIVE   Leukocytes,Ua LARGE (A) NEGATIVE   RBC / HPF 0-5 0 - 5 RBC/hpf   WBC, UA 21-50 0 - 5 WBC/hpf   Bacteria, UA FEW (A) NONE SEEN   Squamous Epithelial / LPF 0-5 0 - 5   Mucus PRESENT   Lactic acid, plasma  Result Value Ref Range   Lactic Acid, Venous 1.1 0.5 - 1.9 mmol/L  Brain natriuretic peptide  Result Value Ref Range   B Natriuretic Peptide 98.8 0.0 - 100.0 pg/mL  Hemoglobin A1c  Result Value Ref Range   Hgb A1c MFr Bld 6.9 (H) 4.8 - 5.6 %   Mean Plasma Glucose 151.33 mg/dL  C-reactive protein  Result Value Ref Range   CRP 8.5 (H) <1.0 mg/dL  D-dimer, quantitative (not at [facility])  Result Value Ref Range   D-Dimer, Quant 1.64 (H) 0.00 - 0.50 ug/mL-FEU  Ferritin  Result Value Ref Range   Ferritin 133 11 - 307 ng/mL  Fibrinogen  Result Value Ref Range   Fibrinogen 618 (H) 210 - 475 mg/dL  Procalcitonin  Result Value Ref Range   Procalcitonin <0.10 ng/mL  CBC  Result Value Ref Range   WBC 6.7 4.0 - 10.5 K/uL   RBC 4.45 3.87 - 5.11 MIL/uL   Hemoglobin 12.7 12.0 - 15.0 g/dL   HCT 38.3 36.0 - 46.0 %   MCV 86.1 80.0 - 100.0 fL   MCH 28.5 26.0 - 34.0 pg   MCHC 33.2 30.0 - 36.0 g/dL   RDW  14.4 11.5 - 15.5 %   Platelets 311 150 - 400 K/uL   nRBC 0.0 0.0 - 0.2 %  Comprehensive metabolic panel  Result Value Ref Range   Sodium 142 135 - 145 mmol/L   Potassium 3.9 3.5 - 5.1 mmol/L   Chloride 108 98 - 111 mmol/L   CO2 21 (L) 22 - 32 mmol/L   Glucose, Bld 109 (H) 70 - 99 mg/dL   BUN 13 8 - 23 mg/dL   Creatinine, Ser 0.73 0.44 - 1.00 mg/dL   Calcium 8.5 (L) 8.9 - 10.3 mg/dL   Total Protein 6.1 (L) 6.5 - 8.1 g/dL   Albumin 2.8 (L) 3.5 - 5.0 g/dL   AST 24 15 - 41 U/L   ALT 20 0 - 44 U/L   Alkaline Phosphatase 71 38 - 126 U/L   Total Bilirubin 1.0 0.3 - 1.2 mg/dL   GFR calc non Af Amer >60 >60 mL/min   GFR calc Af Amer >60 >60 mL/min   Anion gap 13 5 - 15  C-reactive protein  Result Value Ref Range   CRP 10.1 (H) <1.0 mg/dL  Ferritin  Result Value Ref Range   Ferritin 181 11 - 307 ng/mL  D-dimer, quantitative (not at [facility])  Result Value Ref Range   D-Dimer, Quant 1.46 (H) 0.00 - 0.50 ug/mL-FEU  Glucose, capillary  Result Value Ref  Range   Glucose-Capillary 101 (H) 70 - 99 mg/dL  Glucose, capillary  Result Value Ref Range   Glucose-Capillary 89 70 - 99 mg/dL  Glucose, capillary  Result Value Ref Range   Glucose-Capillary 66 (L) 70 - 99 mg/dL  POC SARS Coronavirus 2 Ag-ED - Nasal Swab (BD Veritor Kit)  Result Value Ref Range   SARS Coronavirus 2 Ag POSITIVE (A) NEGATIVE  CBG monitoring, ED  Result Value Ref Range   Glucose-Capillary 59 (L) 70 - 99 mg/dL   Comment 1 Notify RN    Comment 2 Document in Chart   CBG monitoring, ED  Result Value Ref Range   Glucose-Capillary 110 (H) 70 - 99 mg/dL   Comment 1 Notify RN    Comment 2 Document in Chart   CBG monitoring, ED  Result Value Ref Range   Glucose-Capillary 82 70 - 99 mg/dL  CBG monitoring, ED  Result Value Ref Range   Glucose-Capillary 84 70 - 99 mg/dL   Comment 1 Notify RN    Comment 2 Document in Chart   ABO/Rh  Result Value Ref Range   ABO/RH(D)      O POS Performed at [facility],  [address], [place], [place] [address]   Troponin I (High Sensitivity)  Result Value Ref Range   Troponin I (High Sensitivity) 5 <18 ng/L   Assessment & Plan:  This visit occurred during the SARS-CoV-2 public health emergency.  Safety protocols were in place, including screening questions prior to the visit, additional usage of staff PPE, and extensive cleaning of exam room while observing appropriate contact time as indicated for disinfecting solutions.   Problem List Items Addressed This Visit    None       No orders of the defined types were placed in this encounter.  No orders of the defined types were placed in this encounter.   Follow up plan: No follow-ups on file.  Ria Bush, MD

## 2019-07-05 NOTE — Telephone Encounter (Addendum)
Noted. I will see pt tomorrow (virtually) and will have them recheck BP at that time.

## 2019-07-05 NOTE — Progress Notes (Signed)
Virtual visit completed through [employer]. Due to national recommendations of social distancing due to COVID-19, a virtual visit is felt to be most appropriate for this patient at this time. Reviewed limitations of a virtual visit.   Patient location: home [place] location: [place] at [facility], office If any vitals were documented, they were collected by patient at home unless specified below.    BP (!) 147/79   Pulse 66   Temp 98.1 F (36.7 C)   Ht 5' (1.524 m)   Wt 125 lb (56.7 kg)   BMI 24.41 kg/m    CC: hosp f/u visit Subjective:    Patient ID: Meredith Gutierrez, female    DOB: 08/28/1940, 79 y.o.   MRN: [id number]  HPI: FREEDA SPIVEY is a 79 y.o. female presenting on 07/06/2019 for Hospitalization Follow-up (Per [name] [daughter], pt's BP has been elevated.  Cards has been notified. )    Recent hospitalization for Covid-19 pneumonia presenting with fever and agitation treated with incomplete remdesivir course (see below). Decadron was not administered due to steroid allergy. Chest xray showed multifocal pneumonia. Significant agitation while hospitalized, to the point of necessitating early discharge (refusing meds, lab draws, needed restraints). She was found to have bacteria in urine however this was felt to be colonization rather than infection as her procalcitonin level was low. UCx not sent. Blood cultures were negative x2. rec further goals of care discussion outpatient.   She was also found to have LLE DVT - xarelto started in hospital.   Positive covid test in hospital 06/26/2019. Symptom started 06/23/2019. Son and his family were with her for christmas, they also contracted Covid.   Since home, overall feeling better. BP has been somewhat elevated however. On daily lasix 80-137m based on weights. Previously on candesartan 878m Protein levels low while in hospital - daughter notes persistent trouble with low appetite. Weight loss noted. Discussed glucerna supplements.   Daughter was concerned about invokana causing recurrent UTI however latest hospitalization without UTI. She's had ~2 UTIs in 2020. No yeast infections. Will continue to monitor this.  Lab Results  Component Value Date   HGBA1C 6.9 (H) 06/26/2019     Admit date: 06/25/2019 Discharge date: 06/27/2019 TCM hosp f/u phone call completed: 06/28/2019  Recommendations for Outpatient Follow-up:  1. PCP in 1 week, please consider ongoing goals of care discussions with this patient and family, rediscuss CODE STATUS  Discharge Diagnoses:  Principal Problem:   Pneumonia due to COVID-19 virus Left leg DVT Alzheimer's dementia Severe agitation in the hospital   Chronic diastolic (congestive) heart failure (HCC)   Diabetes mellitus type 2, uncontrolled, with complications ([place]  Acute lower UTI   Chronic back pain  Discharge Condition: Stable Diet recommendation: Low-sodium, diabetic     Relevant past medical, surgical, family and social history reviewed and updated as indicated. Interim medical history since our last visit reviewed. Allergies and medications reviewed and updated. Outpatient Medications Prior to Visit  Medication Sig Dispense Refill  . ACCU-CHEK FASTCLIX LANCETS MISC 1 each by Does not apply route daily. Use as directed to check blood sugar once daily.  Dx code:  E11.65 100 each 0  . albuterol (PROVENTIL) (2.5 MG/3ML) 0.083% nebulizer solution Take 3 mLs (2.5 mg total) by nebulization every 6 (six) hours as needed for wheezing or shortness of breath. 75 mL 1  . Alcohol Swabs (B-D SINGLE USE SWABS REGULAR) PADS 1 each by Does not apply route daily. Use as directed to check  blood sugar once daily.  Dx code:  E11.65 100 each 0  . Blood Glucose Monitoring Suppl (ACCU-CHEK AVIVA PLUS) w/Device KIT 1 each by Does not apply route daily. Use as directed to check blood sugar once daily.  Dx code:  E11.65 1 kit 0  . Cholecalciferol (VITAMIN D) 2000 units CAPS Take 1 capsule (2,000 Units  total) by mouth daily. 30 capsule   . furosemide (LASIX) 40 MG tablet Take 1 tablet (40 mg total) by mouth daily as needed. Take 120 mg (3 tablets) once daily for a weight of 135 pounds and over and take 80 mg (2 tablets) once daily for a weight of 134 and below.    Marland Kitchen glucose blood (ACCU-CHEK AVIVA PLUS) test strip 1 each by Other route as needed for other. Use as instructed to check blood sugar once daily.  Dx code:  E11.65 100 each 0  . LORazepam (ATIVAN) 0.5 MG tablet TAKE 1 TABLET BY MOUTH DAILY AT BEDTIME AS NEEDED FOR ANXIETY OR SLEEP MAY TAKE SECOND 1/2 TABLET AS NEEDED (Patient taking differently: Take 0.5 mg by mouth at bedtime as needed for anxiety or sleep. ) 45 tablet 0  . metFORMIN (GLUCOPHAGE-XR) 500 MG 24 hr tablet Take 2 tablets (1,000 mg total) by mouth daily with breakfast. 180 tablet 1  . morphine (MS CONTIN) 15 MG 12 hr tablet Take 1 tablet (15 mg total) by mouth See admin instructions. (Patient taking differently: Take 15 mg by mouth every 12 (twelve) hours. ) 30 tablet 0  . morphine (MSIR) 15 MG tablet Take 15 mg by mouth every 6 (six) hours as needed for severe pain.    . canagliflozin (INVOKANA) 100 MG TABS tablet Take 1 tablet (100 mg total) by mouth daily before breakfast. 30 tablet 6  . Rivaroxaban 15 & 20 MG TBPK Follow package directions: Take one 7m tablet by mouth twice a day. On day 22, switch to one 21mtablet once a day. Take with food. 51 each 0   No facility-administered medications prior to visit.     Per HPI unless specifically indicated in ROS section below Review of Systems Objective:    BP (!) 147/79   Pulse 66   Temp 98.1 F (36.7 C)   Ht 5' (1.524 m)   Wt 125 lb (56.7 kg)   BMI 24.41 kg/m   Wt Readings from Last 3 Encounters:  07/06/19 125 lb (56.7 kg)  06/26/19 135 lb 2.3 oz (61.3 kg)  05/09/19 133 lb 2 oz (60.4 kg)     Physical exam: Gen: alert, NAD, not Gutierrez appearing Pulm: speaks in complete sentences without increased work of  breathing Psych: normal mood, normal thought content      Lab Results  Component Value Date   CREATININE 0.73 06/27/2019   BUN 13 06/27/2019   NA 142 06/27/2019   K 3.9 06/27/2019   CL 108 06/27/2019   CO2 21 (L) 06/27/2019    Lab Results  Component Value Date   WBC 6.7 06/27/2019   HGB 12.7 06/27/2019   HCT 38.3 06/27/2019   MCV 86.1 06/27/2019   PLT 311 06/27/2019   Lab Results  Component Value Date   ALT 20 06/27/2019   AST 24 06/27/2019   ALKPHOS 71 06/27/2019   BILITOT 1.0 06/27/2019   Lab Results  Component Value Date   HGBA1C 6.9 (H) 06/26/2019    Assessment & Plan:   Problem List Items Addressed This Visit    Sundowning  Acute decompensation with severe agitation while in hospital necessitating early discharge - will further eval goals of care at f/u visit (ie do we want to try to limit future hospitalizations given recent poor experience).       Protein-calorie malnutrition ([place])    Malnutrition based on low protein/albumin levels while hospitalized. Daughter endorses persistently poor appetite since home. Discussed glucerna supplementation - she already takes this.       Pneumonia due to COVID-19 virus - Primary    She received incomplete remdesivir course due to agitation, discharged home early. She did not receive steroid course due to prior allergy. Regardless she seems to be recovering very well. Still with some ongoing fatigue. Will continue to monitor.       Lower leg DVT (deep venous thromboembolism), acute, left (HCC)    L popliteal vein DVT - found incidentally 06/27/2019 during hospitalization for covid - presumed 2/2 covid-19 related coagulopathy. On xarelto - given provoked first acute DVT, 3 months of treatment should be adequate. Will reassess at f/u visit. Refilled xarelto for another 2 months. Consider starting PPI preventatively.      Relevant Medications   rivaroxaban (XARELTO) 20 MG TABS tablet   Essential hypertension (Chronic)     Chronic, recent elevations noted, awaiting further recovery from covid prior to restarting antihypertensive (prior was on candesartan 82m). She takes lasix 468m(2-3 tablets) daily PRN based on daily weight.       Relevant Medications   rivaroxaban (XARELTO) 20 MG TABS tablet   Dyslipidemia (statin intolerance) associated with type 2 diabetes mellitus ([place]   Intolerant to statins and zetia, not deemed candidate for PCSK9. Consider once weekly low dose lovastatin trial.       Relevant Medications   canagliflozin (INVOKANA) 100 MG TABS tablet   Dementia (HCC) (Chronic)    Presumed vascular dementia. Aricept stopped during previous hospitalization. Consider namenda. Daughter may be interested in neurology eval - will reassess at f/u visit 08/2019.  MMSE 19/30 (03/2017)      COPD (chronic obstructive pulmonary disease) ([place]   Managed with PRN albuterol neb       Controlled diabetes mellitus type 2 with complications (HCC)    Continue invokana with metformin XR 100042mn am.       Relevant Medications   canagliflozin (INVOKANA) 100 MG TABS tablet   Chronic diastolic (congestive) heart failure (HCC) (Chronic)    Continue lasix. Appreciate cards care. Dry weight was 135lbs, currently weighs 125lbs (weight loss noted).       Relevant Medications   rivaroxaban (XARELTO) 20 MG TABS tablet       Meds ordered this encounter  Medications  . rivaroxaban (XARELTO) 20 MG TABS tablet    Sig: Take 1 tablet (20 mg total) by mouth daily with supper.    Dispense:  30 tablet    Refill:  1    To complete 3 month course  . canagliflozin (INVOKANA) 100 MG TABS tablet    Sig: Take 1 tablet (100 mg total) by mouth daily before breakfast.    Dispense:  30 tablet    Refill:  11   No orders of the defined types were placed in this encounter.   I discussed the assessment and treatment plan with the patient. The patient was provided an opportunity to ask questions and all were answered. The  patient agreed with the plan and demonstrated an understanding of the instructions. The patient was advised to call back or seek  an in-person evaluation if the symptoms worsen or if the condition fails to improve as anticipated.  Follow up plan: No follow-ups on file.  Ria Bush, MD

## 2019-07-05 NOTE — Telephone Encounter (Signed)
Patient's daughter, [name],  is returning phone call. Please call back at [phone].

## 2019-07-06 ENCOUNTER — Encounter: Payer: Self-pay | Admitting: Family Medicine

## 2019-07-06 ENCOUNTER — Ambulatory Visit (INDEPENDENT_AMBULATORY_CARE_PROVIDER_SITE_OTHER): Payer: Medicare HMO | Admitting: Family Medicine

## 2019-07-06 VITALS — BP 147/79 | HR 66 | Temp 98.1°F | Ht 60.0 in | Wt 125.0 lb

## 2019-07-06 DIAGNOSIS — E1169 Type 2 diabetes mellitus with other specified complication: Secondary | ICD-10-CM | POA: Diagnosis not present

## 2019-07-06 DIAGNOSIS — I10 Essential (primary) hypertension: Secondary | ICD-10-CM

## 2019-07-06 DIAGNOSIS — E785 Hyperlipidemia, unspecified: Secondary | ICD-10-CM

## 2019-07-06 DIAGNOSIS — F0390 Unspecified dementia without behavioral disturbance: Secondary | ICD-10-CM | POA: Diagnosis not present

## 2019-07-06 DIAGNOSIS — E118 Type 2 diabetes mellitus with unspecified complications: Secondary | ICD-10-CM

## 2019-07-06 DIAGNOSIS — I824Z2 Acute embolism and thrombosis of unspecified deep veins of left distal lower extremity: Secondary | ICD-10-CM

## 2019-07-06 DIAGNOSIS — E440 Moderate protein-calorie malnutrition: Secondary | ICD-10-CM

## 2019-07-06 DIAGNOSIS — I5032 Chronic diastolic (congestive) heart failure: Secondary | ICD-10-CM

## 2019-07-06 DIAGNOSIS — J1282 Pneumonia due to coronavirus disease 2019: Secondary | ICD-10-CM

## 2019-07-06 DIAGNOSIS — U071 COVID-19: Secondary | ICD-10-CM | POA: Diagnosis not present

## 2019-07-06 DIAGNOSIS — F05 Delirium due to known physiological condition: Secondary | ICD-10-CM

## 2019-07-06 DIAGNOSIS — J449 Chronic obstructive pulmonary disease, unspecified: Secondary | ICD-10-CM

## 2019-07-06 MED ORDER — RIVAROXABAN 20 MG PO TABS: 20.0000 mg | ORAL_TABLET | Freq: Every day | ORAL | 1 refills | Status: DC

## 2019-07-06 NOTE — Telephone Encounter (Signed)
Spoke with the patient's daughter. She has been made aware of Dr. Victorino December recommendations. She will keep a daily log of the patient's heart rate and blood pressure and call back in a week or two to give an update on the patient.  The patient also had a virtual appointment with her PCP today.

## 2019-07-06 NOTE — Telephone Encounter (Signed)
Left a message for the patient to call back.  

## 2019-07-07 ENCOUNTER — Encounter: Payer: Self-pay | Admitting: Family Medicine

## 2019-07-07 DIAGNOSIS — Z86718 Personal history of other venous thrombosis and embolism: Secondary | ICD-10-CM

## 2019-07-07 DIAGNOSIS — I824Z2 Acute embolism and thrombosis of unspecified deep veins of left distal lower extremity: Secondary | ICD-10-CM | POA: Insufficient documentation

## 2019-07-07 DIAGNOSIS — E46 Unspecified protein-calorie malnutrition: Secondary | ICD-10-CM | POA: Insufficient documentation

## 2019-07-07 HISTORY — DX: Personal history of other venous thrombosis and embolism: Z86.718

## 2019-07-07 MED ORDER — CANAGLIFLOZIN 100 MG PO TABS: 100.0000 mg | ORAL_TABLET | Freq: Every day | ORAL | 11 refills | Status: DC

## 2019-07-07 NOTE — Assessment & Plan Note (Addendum)
L popliteal vein DVT - found incidentally 06/27/2019 during hospitalization for covid - presumed 2/2 covid-19 related coagulopathy. On xarelto - given provoked first acute DVT, 3 months of treatment should be adequate. Will reassess at f/u visit. Refilled xarelto for another 2 months. Consider starting PPI preventatively.

## 2019-07-07 NOTE — Assessment & Plan Note (Signed)
Continue lasix. Appreciate cards care. Dry weight was 135lbs, currently weighs 125lbs (weight loss noted).

## 2019-07-07 NOTE — Assessment & Plan Note (Signed)
Managed with PRN albuterol neb

## 2019-07-07 NOTE — Assessment & Plan Note (Addendum)
Intolerant to statins and zetia, not deemed candidate for PCSK9. Consider once weekly low dose lovastatin trial.

## 2019-07-07 NOTE — Assessment & Plan Note (Signed)
Malnutrition based on low protein/albumin levels while hospitalized. Daughter endorses persistently poor appetite since home. Discussed glucerna supplementation - she already takes this.

## 2019-07-07 NOTE — Assessment & Plan Note (Signed)
Continue invokana with metformin XR 1000mg  in am.

## 2019-07-07 NOTE — Assessment & Plan Note (Signed)
She received incomplete remdesivir course due to agitation, discharged home early. She did not receive steroid course due to prior allergy. Regardless she seems to be recovering very well. Still with some ongoing fatigue. Will continue to monitor.

## 2019-07-07 NOTE — Assessment & Plan Note (Signed)
Presumed vascular dementia. Aricept stopped during previous hospitalization. Consider namenda. Daughter may be interested in neurology eval - will reassess at f/u visit 08/2019.  MMSE 19/30 (03/2017)

## 2019-07-07 NOTE — Assessment & Plan Note (Addendum)
Chronic, recent elevations noted, awaiting further recovery from covid prior to restarting antihypertensive (prior was on candesartan 8mg ). She takes lasix 40mg  (2-3 tablets) daily PRN based on daily weight.

## 2019-07-07 NOTE — Assessment & Plan Note (Signed)
Acute decompensation with severe agitation while in hospital necessitating early discharge - will further eval goals of care at f/u visit (ie do we want to try to limit future hospitalizations given recent poor experience).

## 2019-07-16 DIAGNOSIS — E0590 Thyrotoxicosis, unspecified without thyrotoxic crisis or storm: Secondary | ICD-10-CM | POA: Diagnosis not present

## 2019-07-16 DIAGNOSIS — I10 Essential (primary) hypertension: Secondary | ICD-10-CM | POA: Diagnosis not present

## 2019-07-16 DIAGNOSIS — M8580 Other specified disorders of bone density and structure, unspecified site: Secondary | ICD-10-CM | POA: Diagnosis not present

## 2019-07-24 ENCOUNTER — Telehealth: Payer: Self-pay

## 2019-07-24 DIAGNOSIS — F0390 Unspecified dementia without behavioral disturbance: Secondary | ICD-10-CM

## 2019-07-24 NOTE — Telephone Encounter (Signed)
Meredith Gutierrez (DPR signed) said pt seen on 07/06/19 for HFU and Meredith Gutierrez discussed neurology referral with Dr Carmela Hurt request to proceed with neurology referral.

## 2019-07-24 NOTE — Telephone Encounter (Signed)
Referral placed.

## 2019-07-25 NOTE — Telephone Encounter (Signed)
Sent Referral to [place], left message for daughter to call Rosaria Ferries back about Neurology Referral.

## 2019-07-26 ENCOUNTER — Telehealth: Payer: Medicare HMO | Admitting: Cardiovascular Disease

## 2019-08-08 ENCOUNTER — Other Ambulatory Visit: Payer: Self-pay | Admitting: Family Medicine

## 2019-08-08 ENCOUNTER — Telehealth: Payer: Self-pay

## 2019-08-08 DIAGNOSIS — E559 Vitamin D deficiency, unspecified: Secondary | ICD-10-CM

## 2019-08-08 DIAGNOSIS — E0590 Thyrotoxicosis, unspecified without thyrotoxic crisis or storm: Secondary | ICD-10-CM

## 2019-08-08 DIAGNOSIS — E1169 Type 2 diabetes mellitus with other specified complication: Secondary | ICD-10-CM

## 2019-08-08 DIAGNOSIS — E118 Type 2 diabetes mellitus with unspecified complications: Secondary | ICD-10-CM

## 2019-08-08 DIAGNOSIS — F0390 Unspecified dementia without behavioral disturbance: Secondary | ICD-10-CM

## 2019-08-08 NOTE — Telephone Encounter (Signed)
LVM w COVID screen, front door and back lab info 2.24.2021 TLJ

## 2019-08-09 ENCOUNTER — Telehealth: Payer: Self-pay

## 2019-08-09 NOTE — Telephone Encounter (Signed)
-----   Message from Roetta Sessions, [place] sent at 04/05/2019 11:44 AM EDT ----- Regarding: MCRP pancreas - SEDATED Pt due for MRI - MRCP of pancreas (SEDATED) IN March  2021 (chronic pancreatitis)

## 2019-08-09 NOTE — Telephone Encounter (Signed)
Left message for Carol-daughter, to please call back

## 2019-08-10 ENCOUNTER — Other Ambulatory Visit (INDEPENDENT_AMBULATORY_CARE_PROVIDER_SITE_OTHER): Payer: Medicare HMO

## 2019-08-10 ENCOUNTER — Other Ambulatory Visit: Payer: Self-pay

## 2019-08-10 ENCOUNTER — Telehealth: Payer: Self-pay

## 2019-08-10 DIAGNOSIS — E1169 Type 2 diabetes mellitus with other specified complication: Secondary | ICD-10-CM

## 2019-08-10 DIAGNOSIS — E118 Type 2 diabetes mellitus with unspecified complications: Secondary | ICD-10-CM | POA: Diagnosis not present

## 2019-08-10 DIAGNOSIS — K861 Other chronic pancreatitis: Secondary | ICD-10-CM

## 2019-08-10 DIAGNOSIS — E785 Hyperlipidemia, unspecified: Secondary | ICD-10-CM | POA: Diagnosis not present

## 2019-08-10 DIAGNOSIS — F0390 Unspecified dementia without behavioral disturbance: Secondary | ICD-10-CM

## 2019-08-10 LAB — LIPID PANEL
Cholesterol: 231 mg/dL — ABNORMAL HIGH (ref 0–200)
HDL: 34.8 mg/dL — ABNORMAL LOW (ref >39.00)
NonHDL: 196.41
Total CHOL/HDL Ratio: 7
Triglycerides: 202 mg/dL — ABNORMAL HIGH (ref 0.0–149.0)
VLDL: 40.4 mg/dL — ABNORMAL HIGH (ref 0.0–40.0)

## 2019-08-10 LAB — VITAMIN B12: Vitamin B-12: 236 pg/mL (ref 211–911)

## 2019-08-10 LAB — COMPREHENSIVE METABOLIC PANEL
ALT: 10 U/L (ref 0–35)
AST: 11 U/L (ref 0–37)
Albumin: 4.1 g/dL (ref 3.5–5.2)
Alkaline Phosphatase: 68 U/L (ref 39–117)
BUN: 18 mg/dL (ref 6–23)
CO2: 29 mEq/L (ref 19–32)
Calcium: 10.1 mg/dL (ref 8.4–10.5)
Chloride: 104 mEq/L (ref 96–112)
Creatinine, Ser: 0.68 mg/dL (ref 0.40–1.20)
GFR: 83.13 mL/min (ref >60.00)
Glucose, Bld: 123 mg/dL — ABNORMAL HIGH (ref 70–99)
Potassium: 5 mEq/L (ref 3.5–5.1)
Sodium: 138 mEq/L (ref 135–145)
Total Bilirubin: 0.6 mg/dL (ref 0.2–1.2)
Total Protein: 7.5 g/dL (ref 6.0–8.3)

## 2019-08-10 LAB — MICROALBUMIN / CREATININE URINE RATIO
Creatinine,U: 43.2 mg/dL
Microalb Creat Ratio: 6.5 mg/g (ref 0.0–30.0)
Microalb, Ur: 2.8 mg/dL — ABNORMAL HIGH (ref 0.0–1.9)

## 2019-08-10 LAB — LDL CHOLESTEROL, DIRECT: Direct LDL: 161 mg/dL

## 2019-08-10 NOTE — Telephone Encounter (Signed)
Scheduled MRCP/MRI abdomin w and w/o contrast with anesthesia at [facility] on 09/04/19. Patient to arrive at 8:00am, be NPO after midnight , and have a driver. COVID testing required because of anesthesia. Scheduled on 09/01/19 at 11:50am @ [facility] patient to go through the YELLOW lane and tell tester she is having a hospital procedure. H & P form being faxed that will need to be filled out by Dr. Havery Moros, for anesthesia. Daughter [name] aware of all the above

## 2019-08-10 NOTE — Telephone Encounter (Signed)
-----   Message from Yetta Flock, MD sent at 08/08/2019  1:07 PM EST ----- Thanks Sherlynn Stalls. Yes MRCP / MRI pancreas with contrast.  I made a note during my last clinic visit that I think she will need anesthesia assistance to be sedated for this as she required this the last time. Sorry to have to add that, I know that adds some time to coordinate it. Thanks  ----- Message ----- From: Hughie Closs, RN Sent: 08/08/2019  10:23 AM EST To: Yetta Flock, MD  Dr. Havery Moros,  This patient is due for a 6 month F/U MRI-abd. w & w/o contrast. Should this include a MRCP like the last one / Thanks  Annalisia Ingber ----- Message ----- From: Hughie Closs, RN Sent: 08/06/2019 To: Hughie Closs, RN  Schedule 6 month F/U =08/22/19  MRI of pancreas and kidney-Dx:chronic pancreatitis with some suspected cysts in the pancreas. Also tiny lesions in kidney(appear benign)

## 2019-08-10 NOTE — Telephone Encounter (Signed)
Thank you for coordinating.

## 2019-08-13 ENCOUNTER — Encounter: Payer: Self-pay | Admitting: Family Medicine

## 2019-08-13 ENCOUNTER — Other Ambulatory Visit: Payer: Self-pay

## 2019-08-13 ENCOUNTER — Telehealth: Payer: Self-pay | Admitting: Family Medicine

## 2019-08-13 ENCOUNTER — Ambulatory Visit (INDEPENDENT_AMBULATORY_CARE_PROVIDER_SITE_OTHER): Payer: Medicare HMO | Admitting: Family Medicine

## 2019-08-13 VITALS — BP 168/90 | HR 103 | Temp 97.7°F | Ht 58.5 in | Wt 125.2 lb

## 2019-08-13 DIAGNOSIS — E538 Deficiency of other specified B group vitamins: Secondary | ICD-10-CM

## 2019-08-13 DIAGNOSIS — R410 Disorientation, unspecified: Secondary | ICD-10-CM

## 2019-08-13 DIAGNOSIS — M545 Low back pain: Secondary | ICD-10-CM | POA: Diagnosis not present

## 2019-08-13 DIAGNOSIS — E785 Hyperlipidemia, unspecified: Secondary | ICD-10-CM

## 2019-08-13 DIAGNOSIS — J1282 Pneumonia due to coronavirus disease 2019: Secondary | ICD-10-CM

## 2019-08-13 DIAGNOSIS — I10 Essential (primary) hypertension: Secondary | ICD-10-CM | POA: Diagnosis not present

## 2019-08-13 DIAGNOSIS — F05 Delirium due to known physiological condition: Secondary | ICD-10-CM | POA: Diagnosis not present

## 2019-08-13 DIAGNOSIS — G3183 Dementia with Lewy bodies: Secondary | ICD-10-CM

## 2019-08-13 DIAGNOSIS — I5032 Chronic diastolic (congestive) heart failure: Secondary | ICD-10-CM

## 2019-08-13 DIAGNOSIS — E1169 Type 2 diabetes mellitus with other specified complication: Secondary | ICD-10-CM | POA: Diagnosis not present

## 2019-08-13 DIAGNOSIS — K861 Other chronic pancreatitis: Secondary | ICD-10-CM

## 2019-08-13 DIAGNOSIS — E440 Moderate protein-calorie malnutrition: Secondary | ICD-10-CM

## 2019-08-13 DIAGNOSIS — G8929 Other chronic pain: Secondary | ICD-10-CM

## 2019-08-13 DIAGNOSIS — R32 Unspecified urinary incontinence: Secondary | ICD-10-CM

## 2019-08-13 DIAGNOSIS — U071 COVID-19: Secondary | ICD-10-CM

## 2019-08-13 DIAGNOSIS — I824Z2 Acute embolism and thrombosis of unspecified deep veins of left distal lower extremity: Secondary | ICD-10-CM

## 2019-08-13 DIAGNOSIS — F0390 Unspecified dementia without behavioral disturbance: Secondary | ICD-10-CM | POA: Diagnosis not present

## 2019-08-13 LAB — POC URINALSYSI DIPSTICK (AUTOMATED)
Bilirubin, UA: NEGATIVE
Blood, UA: NEGATIVE
Glucose, UA: POSITIVE — AB
Ketones, UA: NEGATIVE
Leukocytes, UA: NEGATIVE
Nitrite, UA: NEGATIVE
Protein, UA: NEGATIVE
Spec Grav, UA: >=1.03 — AB (ref 1.010–1.025)
Urobilinogen, UA: 0.2 E.U./dL
pH, UA: 5.5 (ref 5.0–8.0)

## 2019-08-13 MED ORDER — CANDESARTAN CILEXETIL 4 MG PO TABS: 4.0000 mg | ORAL_TABLET | Freq: Every day | ORAL | 3 refills | Status: DC

## 2019-08-13 MED ORDER — MEMANTINE HCL 5 MG PO TABS: 5.0000 mg | ORAL_TABLET | Freq: Two times a day (BID) | ORAL | 3 refills | Status: DC

## 2019-08-13 MED ORDER — CYANOCOBALAMIN 1000 MCG/ML IJ SOLN
1000.0000 ug | Freq: Once | INTRAMUSCULAR | Status: AC
  Administered 2019-08-13: 1000 ug via INTRAMUSCULAR

## 2019-08-13 MED ORDER — RIVAROXABAN 20 MG PO TABS: 20.0000 mg | ORAL_TABLET | Freq: Every day | ORAL | 1 refills | Status: DC

## 2019-08-13 NOTE — Patient Instructions (Addendum)
Urinalysis today.  B12 shot today I will check to see if we can set up MRI brain at same time as MRI abdomen.  Touch base with pain clinic about trying slow taper off pain medicines.  Restart candesartan 4mg  daily in the morning.  Trial namenda 5mg  daily for 4 days then increase to twice daily.  Return in 1 month for follow up visit.

## 2019-08-13 NOTE — Progress Notes (Signed)
  Chronic Care Management   Outreach Note  08/13/2019 Name: Meredith Gutierrez MRN: [id number] DOB: [birth year]  Referred by: Ria Bush, MD Reason for referral : No chief complaint on file.   An unsuccessful telephone outreach was attempted today. The patient was referred to the pharmacist for assistance with care management and care coordination.   Follow Up Plan:   Raynicia Dukes [employer] Scheduler

## 2019-08-13 NOTE — Progress Notes (Signed)
This visit was conducted in person.  BP (!) 168/90 (BP Location: Left Arm, Patient Position: Sitting, Cuff Size: Normal)   Pulse (!) 103   Temp 97.7 F (36.5 C) (Temporal)   Ht 4' 10.5" (1.486 m)   Wt 125 lb 3 oz (56.8 kg)   SpO2 98%   BMI 25.72 kg/m    CC: AMW - converted to acute visit Subjective:    Patient ID: Meredith Gutierrez, female    DOB: June 29, 1940, 79 y.o.   MRN: [id number]  HPI: Meredith Gutierrez is a 79 y.o. female presenting on 08/13/2019 for Medicare Wellness (BP this morning- 168/76, HR 66.  Pt accompanied by daughter, [name]- temp 98.2.)   Did not see health advisor this year.    Hearing Screening   125Hz  250Hz  500Hz  1000Hz  2000Hz  3000Hz  4000Hz  6000Hz  8000Hz   Right ear:           Left ear:           Comments: Pt did not tolerated hearing test well.  Did not want to continue.     Visual Acuity Screening   Right eye Left eye Both eyes  Without correction: 20/50 20/50 20/50   With correction:         Office Visit from 08/13/2019 in [place] at [facility] Total Score  0      Fall Risk  08/13/2019 04/23/2019 04/07/2018 04/06/2017  Falls in the past year? 1 0 No No  Number falls in past yr: 1 0 - -  Injury with Fall? 0 0 - -  Risk for fall due to : - Medication side effect - -  Follow up - Falls evaluation completed;Falls prevention discussed - -      Upcoming MRCP/MRI abd later this month to follow chronic pancreatitis.   Hospitalized 06/2019 with covid pneumonia - did improve after this. On xarelto for LLE DVT found during hospitalization.   Dementia - awaiting neurology referral (08/28/2019). Today is not a good day - noticing huge decline over the last 3 months. Daughter notes shuffling gait since covid. She hasn't regained her gait stability. Increasing accidents (both bladder and bowel) - trouble getting to the bathroom in time with incomplete emptying. Increased straining to urinate noted by daughter. Worsening hallucinations that are  distressing to her - she has conversations with people that aren't there.   Notes fingers turning purple.   Ongoing hypertension (was previously on candesartan 44m). Upcoming cardiology appt later this month. BP has been elevated.   Continues MSContin 149mBID with MSIR 1557m seldom. Just saw pain clinic this week.   Preventative: Colon cancer screening -cologuard negative 04/2017. Lung cancer screening -not eligible. 1 pack/wk x about 30 yrs. Breast cancer screening -03/2015 WNL. Due for rpt. [facility] -s/p hysterectomy, ovaries removed age [age]/p HRT. DEXA scan -2017, normal per daughterdone by Dr BalChalmers Caterill request records. Flu shotyearly  pnumovax 2010, prevnar 2016 zostavax 2015 shingrix - discussed Advanced directive: living will/HCPOA scanned and in chart 03/2017. HCPOA are daughter CarArbie Cookeyd son JohCharlotte Crumboes not want prolonged life support if terminal condition. Seat belt use discussed Sunscreen use discussed, no changing moles on skin. H/o melanoma. rec yearly derm eval. Smoker - 1 pack per week Alcohol - none  Widow Lives with daughter and daughter's GF Edu: HS Occ: retired, worked at [employer]: no regular exercise Diet: good water     Relevant past medical, surgical, family  and social history reviewed and updated as indicated. Interim medical history since our last visit reviewed. Allergies and medications reviewed and updated. Outpatient Medications Prior to Visit  Medication Sig Dispense Refill  . ACCU-CHEK FASTCLIX LANCETS MISC 1 each by Does not apply route daily. Use as directed to check blood sugar once daily.  Dx code:  E11.65 100 each 0  . albuterol (PROVENTIL) (2.5 MG/3ML) 0.083% nebulizer solution Take 3 mLs (2.5 mg total) by nebulization every 6 (six) hours as needed for wheezing or shortness of breath. 75 mL 1  . Alcohol Swabs (B-D SINGLE USE SWABS REGULAR) PADS 1 each by Does not apply route daily.  Use as directed to check blood sugar once daily.  Dx code:  E11.65 100 each 0  . Blood Glucose Monitoring Suppl (ACCU-CHEK AVIVA PLUS) w/Device KIT 1 each by Does not apply route daily. Use as directed to check blood sugar once daily.  Dx code:  E11.65 1 kit 0  . canagliflozin (INVOKANA) 100 MG TABS tablet Take 1 tablet (100 mg total) by mouth daily before breakfast. 30 tablet 11  . Cholecalciferol (VITAMIN D) 2000 units CAPS Take 1 capsule (2,000 Units total) by mouth daily. 30 capsule   . furosemide (LASIX) 40 MG tablet Take 1 tablet (40 mg total) by mouth daily as needed. Take 120 mg (3 tablets) once daily for a weight of 135 pounds and over and take 80 mg (2 tablets) once daily for a weight of 134 and below.    Marland Kitchen glucose blood (ACCU-CHEK AVIVA PLUS) test strip 1 each by Other route as needed for other. Use as instructed to check blood sugar once daily.  Dx code:  E11.65 100 each 0  . LORazepam (ATIVAN) 0.5 MG tablet TAKE 1 TABLET BY MOUTH DAILY AT BEDTIME AS NEEDED FOR ANXIETY OR SLEEP MAY TAKE SECOND 1/2 TABLET AS NEEDED (Patient taking differently: Take 0.5 mg by mouth at bedtime as needed for anxiety or sleep. ) 45 tablet 0  . metFORMIN (GLUCOPHAGE-XR) 500 MG 24 hr tablet Take 2 tablets (1,000 mg total) by mouth daily with breakfast. 180 tablet 1  . morphine (MS CONTIN) 15 MG 12 hr tablet Take 1 tablet (15 mg total) by mouth See admin instructions. (Patient taking differently: Take 15 mg by mouth every 12 (twelve) hours. ) 30 tablet 0  . morphine (MSIR) 15 MG tablet Take 15 mg by mouth every 6 (six) hours as needed for severe pain.    . rivaroxaban (XARELTO) 20 MG TABS tablet Take 1 tablet (20 mg total) by mouth daily with supper. 30 tablet 1   No facility-administered medications prior to visit.     Per HPI unless specifically indicated in ROS section below Review of Systems Objective:    BP (!) 168/90 (BP Location: Left Arm, Patient Position: Sitting, Cuff Size: Normal)   Pulse (!) 103    Temp 97.7 F (36.5 C) (Temporal)   Ht 4' 10.5" (1.486 m)   Wt 125 lb 3 oz (56.8 kg)   SpO2 98%   BMI 25.72 kg/m   Wt Readings from Last 3 Encounters:  08/13/19 125 lb 3 oz (56.8 kg)  07/06/19 125 lb (56.7 kg)  06/26/19 135 lb 2.3 oz (61.3 kg)    Physical Exam Vitals and nursing note reviewed.  Constitutional:      Appearance: Normal appearance. She is not Gutierrez-appearing.  Eyes:     Extraocular Movements: Extraocular movements intact.     Pupils: Pupils are equal,  round, and reactive to light.  Cardiovascular:     Rate and Rhythm: Normal rate and regular rhythm.     Pulses: Normal pulses.     Heart sounds: Normal heart sounds. No murmur.  Pulmonary:     Effort: Pulmonary effort is normal. No respiratory distress.     Breath sounds: Normal breath sounds. No wheezing, rhonchi or rales.  Skin:    General: Skin is warm and dry.     Findings: No rash.     Comments: Purple hue to R 3rd distal digit, nontender  Neurological:     Mental Status: She is alert.     Comments:  Oriented to person but not place or time. Recognizes daughter but doesn't recognize provider. Shuffling gait present No cogwheel rigidity No tremor present       Results for orders placed or performed in visit on 08/13/19  POCT Urinalysis Dipstick (Automated)  Result Value Ref Range   Color, UA yellow    Clarity, UA clear    Glucose, UA Positive (A) Negative   Bilirubin, UA negative    Ketones, UA negative    Spec Grav, UA >=1.030 (A) 1.010 - 1.025   Blood, UA negative    pH, UA 5.5 5.0 - 8.0   Protein, UA Negative Negative   Urobilinogen, UA 0.2 0.2 or 1.0 E.U./dL   Nitrite, UA negative    Leukocytes, UA Negative Negative   Lab Results  Component Value Date   TSH 1.25 04/23/2019    Lab Results  Component Value Date   HGBA1C 6.9 (H) 06/26/2019    Assessment & Plan:  This visit occurred during the SARS-CoV-2 public health emergency.  Safety protocols were in place, including screening  questions prior to the visit, additional usage of staff PPE, and extensive cleaning of exam room while observing appropriate contact time as indicated for disinfecting solutions.   Problem List Items Addressed This Visit    Urinary incontinence    Daughter endorses worsening incontinence - check UA today.       Relevant Orders   POCT Urinalysis Dipstick (Automated) (Completed)   Sundowning   Protein-calorie malnutrition (HCC)    Albumin normal on latest check last month.       Pneumonia due to COVID-19 virus    Seems fully resolved however with residual cognitive decline since infection.       Lower leg DVT (deep venous thromboembolism), acute, left (HCC)    Incidentally found on latest hospitalization - on xarelto x 3 months. Treatment started 06/27/2019, should be able to discontinue 09/25/2019. Will reassess at f/u visit.       Relevant Medications   rivaroxaban (XARELTO) 20 MG TABS tablet   candesartan (ATACAND) 4 MG tablet   Low serum vitamin B12    Levels low normal - will replace with 1025mg B12 injection today then rec start 100645m oral daily.       Essential hypertension (Chronic)    BP remaining elevated - will restart candesartan 45m3maily.       Relevant Medications   rivaroxaban (XARELTO) 20 MG TABS tablet   candesartan (ATACAND) 4 MG tablet   Dyslipidemia (statin intolerance) associated with type 2 diabetes mellitus (HCCAldine  Not on cholesterol medication - trouble tolerating statins and zetia in the past. Not candidate for PCSK9 - doesn't do well with injections. Consider once weekly low dose statin.       Relevant Medications   candesartan (ATACAND) 4 MG tablet  Dementia ([place]) - Primary (Chronic)    Progressive worsening over the past 3 months, acutely worse after covid infection. Ongoing sun-downing, increasing confusion noted, increasing bowel/bladder incontinence. Increasing hallucinations also noted. Daughter endorses shuffling gait but no other  parkinsonisms of masked facies, rigidity, tremor.  Pending neurology eval later this month.  aricept stopped previous hospitalization.  Will trial namenda 46m daily x 1 wk then increase to BID dosing. RTC 4 wks f/u visit.  She has MRCP/MR abdomen scheduled for later this month - under anesthesia due to difficulty with MRI in the past. Will see if we can add MRI brain on same day to further evaluate progressive dementia.       Relevant Medications   memantine (NAMENDA) 5 MG tablet   Chronic pancreatitis (HCC)    Planned Q6 mo MR       Chronic lower back pain    Followed by pain clinic on MS Contin 148mBID and PRN sparing MSIR 1533mI recommend they discuss slow taper off opioid with pain management to eval for effect on cognition.       Chronic diastolic (congestive) heart failure (HCC) (Chronic)    Seems euvolemic, without recent need for lasix. Takes lasix and potassium PRN.      Relevant Medications   rivaroxaban (XARELTO) 20 MG TABS tablet   candesartan (ATACAND) 4 MG tablet   Altered mental status    Acutely worse mentation since covid infection 06/2019. Anticipate multifactorial. Check UA today. Will try to order MRI on same date as upcoming MR abdomen.        Other Visit Diagnoses    Dementia with Lewy bodies (CODE) (HCCDeering     Relevant Medications   memantine (NAMENDA) 5 MG tablet   Other Relevant Orders   MR Brain Wo Contrast       Meds ordered this encounter  Medications  . rivaroxaban (XARELTO) 20 MG TABS tablet    Sig: Take 1 tablet (20 mg total) by mouth daily with supper.    Dispense:  30 tablet    Refill:  1    To complete 3 month course - started 06/26/2019  . candesartan (ATACAND) 4 MG tablet    Sig: Take 1 tablet (4 mg total) by mouth daily.    Dispense:  30 tablet    Refill:  3  . memantine (NAMENDA) 5 MG tablet    Sig: Take 1 tablet (5 mg total) by mouth 2 (two) times daily. First 4 days take 1 tablet daily    Dispense:  60 tablet    Refill:  3   . cyanocobalamin ((VITAMIN B-12)) injection 1,000 mcg  . Cyanocobalamin (B-12) 1000 MCG SUBL    Sig: Place 1 tablet under the tongue daily.   Orders Placed This Encounter  Procedures  . MR Brain Wo Contrast    Patient has planned abdominal MRI/MRCP 09/04/2019 - can MR brain be done at same time?    Standing Status:   Future    Standing Expiration Date:   10/13/2020    Order Specific Question:   ** REASON FOR EXAM (FREE TEXT)    Answer:   progressively worsening dementia with AMS    Order Specific Question:   What is the patient's sedation requirement?    Answer:   General Anesthesia ([facility] ONLY at [facility]  Order Specific Question:   Does the patient have a pacemaker or implanted devices?    Answer:   No  Order Specific Question:   Preferred imaging location?    Answer:   [facility] (table limit-500 lbs)    Order Specific Question:   Radiology Contrast Protocol - do NOT remove file path    Answer:   [email or web address]  . POCT Urinalysis Dipstick (Automated)    Follow up plan: Return in about 4 weeks (around 09/10/2019) for follow up visit.  Ria Bush, MD

## 2019-08-14 DIAGNOSIS — R4182 Altered mental status, unspecified: Secondary | ICD-10-CM | POA: Insufficient documentation

## 2019-08-14 DIAGNOSIS — R32 Unspecified urinary incontinence: Secondary | ICD-10-CM | POA: Insufficient documentation

## 2019-08-14 DIAGNOSIS — E538 Deficiency of other specified B group vitamins: Secondary | ICD-10-CM | POA: Insufficient documentation

## 2019-08-14 MED ORDER — B-12 1000 MCG SL SUBL: 1.0000 | SUBLINGUAL_TABLET | Freq: Every day | SUBLINGUAL | Status: DC

## 2019-08-14 NOTE — Assessment & Plan Note (Signed)
Acutely worse mentation since covid infection 06/2019. Anticipate multifactorial. Check UA today. Will try to order MRI on same date as upcoming MR abdomen.

## 2019-08-14 NOTE — Assessment & Plan Note (Signed)
Seems euvolemic, without recent need for lasix. Takes lasix and potassium PRN.

## 2019-08-14 NOTE — Assessment & Plan Note (Signed)
Seems fully resolved however with residual cognitive decline since infection.

## 2019-08-14 NOTE — Assessment & Plan Note (Signed)
Levels low normal - will replace with 1020mcg B12 injection today then rec start 1067mcg oral daily.

## 2019-08-14 NOTE — Assessment & Plan Note (Signed)
Albumin normal on latest check last month.

## 2019-08-14 NOTE — Assessment & Plan Note (Addendum)
Followed by pain clinic on MS Contin 15mg  BID and PRN sparing MSIR 15mg . I recommend they discuss slow taper off opioid with pain management to eval for effect on cognition.

## 2019-08-14 NOTE — Assessment & Plan Note (Addendum)
Daughter endorses worsening incontinence - check UA today.

## 2019-08-14 NOTE — Assessment & Plan Note (Signed)
Planned Q6 mo MR

## 2019-08-14 NOTE — Assessment & Plan Note (Deleted)
Planned Q6 mo MR

## 2019-08-14 NOTE — Assessment & Plan Note (Addendum)
Progressive worsening over the past 3 months, acutely worse after covid infection. Ongoing sun-downing, increasing confusion noted, increasing bowel/bladder incontinence. Increasing hallucinations also noted. Daughter endorses shuffling gait but no other parkinsonisms of masked facies, rigidity, tremor.  Pending neurology eval later this month.  aricept stopped previous hospitalization.  Will trial namenda 5mg  daily x 1 wk then increase to BID dosing. RTC 4 wks f/u visit.  She has MRCP/MR abdomen scheduled for later this month - under anesthesia due to difficulty with MRI in the past. Will see if we can add MRI brain on same day to further evaluate progressive dementia.

## 2019-08-14 NOTE — Assessment & Plan Note (Addendum)
Not on cholesterol medication - trouble tolerating statins and zetia in the past. Not candidate for PCSK9 - doesn't do well with injections. Consider once weekly low dose statin.

## 2019-08-14 NOTE — Assessment & Plan Note (Signed)
Incidentally found on latest hospitalization - on xarelto x 3 months. Treatment started 06/27/2019, should be able to discontinue 09/25/2019. Will reassess at f/u visit.

## 2019-08-14 NOTE — Assessment & Plan Note (Addendum)
BP remaining elevated - will restart candesartan 4mg  daily.

## 2019-08-15 LAB — FRUCTOSAMINE: Fructosamine: 273 umol/L (ref 205–285)

## 2019-08-16 ENCOUNTER — Telehealth: Payer: Self-pay | Admitting: Gastroenterology

## 2019-08-16 ENCOUNTER — Telehealth: Payer: Self-pay | Admitting: *Deleted

## 2019-08-16 ENCOUNTER — Encounter: Payer: Self-pay | Admitting: Cardiovascular Disease

## 2019-08-16 ENCOUNTER — Telehealth (INDEPENDENT_AMBULATORY_CARE_PROVIDER_SITE_OTHER): Payer: Medicare HMO | Admitting: Cardiovascular Disease

## 2019-08-16 DIAGNOSIS — I10 Essential (primary) hypertension: Secondary | ICD-10-CM

## 2019-08-16 DIAGNOSIS — E118 Type 2 diabetes mellitus with unspecified complications: Secondary | ICD-10-CM

## 2019-08-16 DIAGNOSIS — F0390 Unspecified dementia without behavioral disturbance: Secondary | ICD-10-CM

## 2019-08-16 DIAGNOSIS — I824Z2 Acute embolism and thrombosis of unspecified deep veins of left distal lower extremity: Secondary | ICD-10-CM | POA: Diagnosis not present

## 2019-08-16 DIAGNOSIS — I5032 Chronic diastolic (congestive) heart failure: Secondary | ICD-10-CM

## 2019-08-16 DIAGNOSIS — I700 Atherosclerosis of aorta: Secondary | ICD-10-CM

## 2019-08-16 NOTE — Telephone Encounter (Signed)

## 2019-08-16 NOTE — Telephone Encounter (Signed)
Left two messages for the patient and patient's daughter to call back to get them ready for their virtual appointment this morning with Dr. Sallyanne Kuster.

## 2019-08-16 NOTE — Patient Instructions (Signed)
Medication Instructions:  STOP Xarelto 09/25/19  *If you need a refill on your cardiac medications before your next appointment, please call your pharmacy*   Lab Work: None ordered If you have labs (blood work) drawn today and your tests are completely normal, you will receive your results only by: Marland Kitchen MyChart Message (if you have MyChart) OR . A paper copy in the mail If you have any lab test that is abnormal or we need to change your treatment, we will call you to review the results.   Testing/Procedures: None ordered   Follow-Up: At [facility], you and your health needs are our priority.  As part of our continuing mission to provide you with exceptional heart care, we have created designated Provider Care Teams.  These Care Teams include your primary Cardiologist (physician) and Advanced Practice Providers (APPs -  Physician Assistants and Nurse Practitioners) who all work together to provide you with the care you need, when you need it.  We recommend signing up for the patient portal called "MyChart".  Sign up information is provided on this After Visit Summary.  MyChart is used to connect with patients for Virtual Visits (Telemedicine).  Patients are able to view lab/test results, encounter notes, upcoming appointments, etc.  Non-urgent messages can be sent to your provider as well.   To learn more about what you can do with MyChart, go to [email or web address].    Your next appointment:   6 month(s)  The format for your next appointment:   In Person  Provider:   You may see Sanda Klein, MD or one of the following Advanced Practice Providers on your designated Care Team:    Almyra Deforest, PA-C  Fabian Sharp, [place] or   Roby Lofts, [place]    Other Instructions Dr. Sallyanne Kuster would like you to check your blood pressure daily for the next week.  Keep a journal of these daily blood pressure and heart rate readings and call our office or send a message through [place] with  the results. Thank you!

## 2019-08-16 NOTE — Progress Notes (Signed)
Virtual Visit via Telephone Note   This visit type was conducted due to national recommendations for restrictions regarding the COVID-19 Pandemic (e.g. social distancing) in an effort to limit this patient's exposure and mitigate transmission in our community.  Due to her co-morbid illnesses, this patient is at least at moderate risk for complications without adequate follow up.  This format is felt to be most appropriate for this patient at this time.  The patient did not have access to video technology/had technical difficulties with video requiring transitioning to audio format only (telephone).  All issues noted in this document were discussed and addressed.  No physical exam could be performed with this format.  Please refer to the patient's chart for her  consent to telehealth for [facility].   The patient was identified using 2 identifiers.  Date:  08/16/2019   ID:  Meredith Gutierrez, DOB [birth year], MRN [id number]  Patient Location: Home Provider Location: Other:  [facility]  PCP:  Ria Bush, MD  Cardiologist:  Sanda Klein, MD  Electrophysiologist:  None   Evaluation Performed:  Follow-Up Visit  Chief Complaint: CHF, CAD, hypertension  History of Present Illness:    Meredith Gutierrez is a 79 y.o. female with longstanding systemic hypertension,  CAD S/P CABG (2003 LIMA to LAD and SVG to first diagonal, ligation of large LAD artery aneurysm, atretic LIMA with patent LAD at cardiac cath in 2013), without coronary events since that time. She has hyperdynamic left ventricular systolic function, moderate LVH, chronic diastolic heart failure, right renal artery stenosis, hyperlipidemia intolerant to numerous attempts at lipid lowering therapy (rosuvastatin, simvastatin, Zetia and others). She is unable to take beta blockers due to bradycardia.   She has had slowly progressive dementia.  The interview was mostly conducted with her daughter and medical power of attorney,  Meredith Gutierrez.  From a cardiovascular symptom point of view she is doing well, without any angina or dyspnea either at rest or with activity.  She does not have orthopnea or PND and has at most occasional trace ankle edema.  She has not had syncope or palpitations and denies new focal neurological complaints but is increasingly unsteady when walking.  [name] had COVID-19 infection in January.  She developed a left popliteal DVT and was started on Xarelto on January 13.  She did not have pulmonary embolism and has tolerated anticoagulation without bleeding complications, other than easy bruising of her forearms.  During her hospital stay her antihypertensive medications were stopped but her blood pressure has gradually increased since discharge.  Recently has blood pressure has been in the 149-169/70s range with heart rates in the 70s.  She saw Dr. Danise Mina a couple of days ago and candesartan 4 mg daily was started.  Her blood pressure remains mildly elevated.  Her weight has been very stable in the 123-127 range.  Her gait has become increasingly unsteady.  She has an appointment with neurology next week.  The patient does not have symptoms concerning for COVID-19 infection (fever, chills, cough, or new shortness of breath).    Past Medical History:  Diagnosis Date  . Angina    never needed to take nitroglycerin.  . Anxiety   . Blood transfusion without reported diagnosis   . Cerebral atherosclerosis   . Community acquired pneumonia of left lower lobe of lung 12/20/2018  . COPD (chronic obstructive pulmonary disease) ([place])    02-16-13-"pt. denies this"-shows on CXR of 2- 2013.  Marland Kitchen Coronary artery disease 2003  s/p CABG 2014  . COVID-19 virus infection 06/2019   Covid-19 PNA  . Dementia ([place])   . Diabetes mellitus without complication ([place]) [id number]   NEW ONSET  . Diastolic CHF, on admit treated. 08/10/2011  . Family history of adverse reaction to anesthesia    Dad and Sister had trouble  waking up  . Fibromuscular dysplasia of renal artery ([facility])    right  . Heart murmur   . History of kidney stones 02-16-13   "hx. of overgrowth of muscle-causing some stricture"renal artery stenosis-being followed [facility]  . Hyperlipidemia   . Hypertension   . Hyperthyroidism    [place]  . Hyponatremia 04/2018  . Melanoma ([place]) 2010   skin cancers, 1 melanoma removed back  . Osteoarthritis   . Pneumonia due to COVID-19 virus 06/26/2019   Past Surgical History:  Procedure Laterality Date  . ABDOMINAL HYSTERECTOMY  1970   heavy bleeding  . APPENDECTOMY  1960s  . CARDIAC CATHETERIZATION  08/09/2011   no sign. coronary obstruction. LIMA is atretic w/excellent flow down the native LAD  . CARDIAC CATHETERIZATION  08/22/2001   no sign. coronary stenosis,  . CATARACT EXTRACTION, BILATERAL Bilateral   . CESAREAN SECTION    . CORONARY ARTERY BYPASS GRAFT  02-16-13   3'03-no bypasses -Had 2 aneurysm repairs(stents used)  . ESOPHAGOGASTRODUODENOSCOPY  04/2018   LA Grade A esophagitis, small nodule distal esophagus (benign schwann cell hamartoma), empiric dilation to 75mm ([place])  . LAPAROTOMY N/A 02/20/2013   Procedure: EXPLORATORY LAPAROTOMY;  [name], MD  . LEFT HEART CATHETERIZATION WITH CORONARY ANGIOGRAM N/A 08/09/2011   Procedure: LEFT HEART CATHETERIZATION WITH CORONARY ANGIOGRAM;  Surgeon: Sanda Klein, MD;  Location: [facility];  Service: Cardiovascular;  Laterality: N/A;  . NM MYOCAR PERF WALL MOTION  07/20/2010   normal  . RADIOLOGY WITH ANESTHESIA N/A 02/22/2019   Procedure: MRI OF ABDOMEN WITH AND WITHOUT CONTRAST;  Surgeon: Radiologist, Medication, MD;  Location: [facility];  Service: Radiology;  Laterality: N/A;  . SALPINGOOPHORECTOMY Bilateral 02/20/2013   benign seromucinous cystadenofibroma R ovary [name], MD)  . TOOTH EXTRACTION    . TOTAL HIP ARTHROPLASTY Left 2015     Current Meds  Medication Sig  . ACCU-CHEK FASTCLIX LANCETS MISC 1 each by Does not apply route  daily. Use as directed to check blood sugar once daily.  Dx code:  E11.65  . albuterol (PROVENTIL) (2.5 MG/3ML) 0.083% nebulizer solution Take 3 mLs (2.5 mg total) by nebulization every 6 (six) hours as needed for wheezing or shortness of breath.  . canagliflozin (INVOKANA) 100 MG TABS tablet Take 1 tablet (100 mg total) by mouth daily before breakfast.  . candesartan (ATACAND) 4 MG tablet Take 1 tablet (4 mg total) by mouth daily.  . Cholecalciferol (VITAMIN D) 2000 units CAPS Take 1 capsule (2,000 Units total) by mouth daily.  . Cyanocobalamin (B-12) 1000 MCG SUBL Place 1 tablet under the tongue daily.  . furosemide (LASIX) 40 MG tablet Take 1 tablet (40 mg total) by mouth daily as needed. Take 120 mg (3 tablets) once daily for a weight of 135 pounds and over and take 80 mg (2 tablets) once daily for a weight of 134 and below.  Marland Kitchen LORazepam (ATIVAN) 0.5 MG tablet TAKE 1 TABLET BY MOUTH DAILY AT BEDTIME AS NEEDED FOR ANXIETY OR SLEEP MAY TAKE SECOND 1/2 TABLET AS NEEDED (Patient taking differently: Take 0.5 mg by mouth at bedtime as needed for anxiety or sleep. )  . memantine (  NAMENDA) 5 MG tablet Take 1 tablet (5 mg total) by mouth 2 (two) times daily. First 4 days take 1 tablet daily  . metFORMIN (GLUCOPHAGE-XR) 500 MG 24 hr tablet Take 2 tablets (1,000 mg total) by mouth daily with breakfast.  . morphine (MS CONTIN) 15 MG 12 hr tablet Take 1 tablet (15 mg total) by mouth See admin instructions. (Patient taking differently: Take 15 mg by mouth every 12 (twelve) hours. )  . morphine (MSIR) 15 MG tablet Take 15 mg by mouth every 6 (six) hours as needed for severe pain.  . [DISCONTINUED] rivaroxaban (XARELTO) 20 MG TABS tablet Take 1 tablet (20 mg total) by mouth daily with supper.     Allergies:   Codeine, Prednisolone acetate, Prednisone, Nitroglycerin, Metolazone, Adhesive [tape], Crestor [rosuvastatin calcium], Lopressor [metoprolol tartrate], Sulfamethoxazole, and Zetia [ezetimibe]   Social  History   Tobacco Use  . Smoking status: Former Smoker    Packs/day: 0.10    Years: 20.00    Pack years: 2.00    Types: Cigarettes    Quit date: 09/14/2017    Years since quitting: 1.9  . Smokeless tobacco: Never Used  . Tobacco comment: smoking cessation info given  Substance Use Topics  . Alcohol use: No  . Drug use: No     Family Hx: The patient's family history includes Breast cancer in her maternal aunt; Colon polyps in her maternal grandfather; Coronary artery disease in her father; Dementia (age of onset: 41) in her mother; Diabetes in her brother; Hypertension in her brother and father; Hypotension in her mother and sister; Lung cancer in her daughter; Stroke in her mother; Thyroid cancer in her daughter. There is no history of Colon cancer, Esophageal cancer, Stomach cancer, or Rectal cancer.  ROS:   Please see the history of present illness.     All other systems reviewed and are negative.  Prior CV studies:   The following studies were reviewed today:  Venous duplex ultrasound June 26, 2019, renal duplex ultrasound November 2019  echo December 21, 2018 1. The left ventricle has normal systolic function with an ejection  fraction of 60-65%. The cavity size was normal. There is mild concentric  left ventricular hypertrophy. Left ventricular diastolic Doppler  parameters are consistent with impaired  relaxation. Elevated left ventricular end-diastolic pressure The E/e' is  21. No evidence of left ventricular regional wall motion abnormalities.  2. The right ventricle has normal systolic function. The cavity was  normal. There is no increase in right ventricular wall thickness. Right  ventricular systolic pressure is normal with an estimated pressure of 28.1  mmHg.  3. Left atrial size was moderately dilated.  4. Right atrial size was moderately dilated.  5. Mild thickening of the mitral valve leaflet. Mild calcification of the  mitral valve leaflet. There is mild  mitral annular calcification present.  No evidence of mitral valve stenosis.  6. Moderate thickening of the aortic valve. Moderate calcification of the  aortic valve. No stenosis of the aortic valve.  7. The aortic root is normal in size and structure.  8. There is mild dilatation of the ascending aorta measuring 41 mm.  9. The inferior vena cava was dilated in size with >50% respiratory  variability.   Labs/Other Tests and Data Reviewed:    EKG:  An ECG dated 06/26/2019 was personally reviewed today and demonstrated:  Sinus rhythm, unusual precordial R wave progression, likely due to erroneous lead position.  Recent Labs: 04/23/2019: TSH 1.25 06/26/2019:  B Natriuretic Peptide 98.8 06/27/2019: Hemoglobin 12.7; Platelets 311 08/10/2019: ALT 10; BUN 18; Creatinine, Ser 0.68; Potassium 5.0; Sodium 138   Recent Lipid Panel Lab Results  Component Value Date/Time   CHOL 231 (H) 08/10/2019 08:22 AM   TRIG 202.0 (H) 08/10/2019 08:22 AM   HDL 34.80 (L) 08/10/2019 08:22 AM   CHOLHDL 7 08/10/2019 08:22 AM   LDLCALC 123 (H) 04/23/2019 07:46 AM   LDLDIRECT 161.0 08/10/2019 08:22 AM    Wt Readings from Last 3 Encounters:  08/16/19 126 lb (57.2 kg)  08/13/19 125 lb 3 oz (56.8 kg)  07/06/19 125 lb (56.7 kg)     Objective:    Vital Signs:  BP (!) 164/75   Pulse 73   Wt 126 lb (57.2 kg)   BMI 25.89 kg/m    VITAL SIGNS:  reviewed Unable to examine  ASSESSMENT & PLAN:    1. CHF: As far as I can tell with the limitations of a phone interview, Jazmina seems to be euvolemic.  She is quite sedentary so is hard to assess her functional status but it is fair to say she is not limited by heart failure complaints.  Her weight is close to her previous estimation of her "dry weight".  In the past, she appeared to develop symptoms of heart failure when her weight exceeded 135 pounds.  Her medications include a loop diuretic and an SGLT2 inhibitor.  She has preserved left ventricular systolic  function. 2. HTN: Her blood pressure is elevated, but candesartan has only recently been started.  The current dose of candesartan is probably less of an antihypertensive than her valsartan 160 mg daily which she took before.  I suspect we will have to increase the dose, but there is no reason to rush.  We will allow the current medication to achieve a steady state.  I asked [name] to call us with Ashlinn's blood pressure next Monday before making any additional adjustments. 3. DM: Fair control.  Most recent hemoglobin A1c 6.9%.  She is on SGLT2 inhibitor and her need for loop diuretics seems to have diminished since then. 4. CAD: Currently asymptomatic.  Most of her risk factors, with the exception of hypercholesterolemia, have been addressed. 5. HLP: Numerous attempts at treatment with statins and ezetimibe have failed due to side effects.  Considering her all her comorbid conditions, I am not sure that PCSK9 inhibitors are indicated.  Her daughter reports that she does very poorly with shots.  We will treat conservatively with dietary restrictions. 6. DVT: Popliteal DVT in the setting of acute medical illness.  3 months of anticoagulation are probably sufficient.  She should discontinue the anticoagulant after April 13. 7. Rt RAS: Renal artery stenosis was confirmed by duplex ultrasound in 2019.  Interestingly in the past she had left renal artery stenosis.  The diagnosis was always presumed to be fibromuscular dysplasia.  As long as we can control her blood pressure and her renal function remained stable, no reason to consider angioplasty. 8. Ao Atheroscl: Well documented on imaging studies.  Unfortunately intolerant to oral lipid-lowering agents. 9. Dementia: At this point, cognitive decline is having a ever increasingly significant impact on her prognosis.  Has a follow-up visit with her neurologist already scheduled.  It is appropriate to avoid aggressive/invasive interventions.  COVID-19  Education: The signs and symptoms of COVID-19 were discussed with the patient and how to seek care for testing (follow up with PCP or arrange E-visit).  The importance of social distancing  was discussed today.  Time:   Today, I have spent 20 minutes with the patient with telehealth technology discussing the above problems.     Medication Adjustments/Labs and Tests Ordered: Current medicines are reviewed at length with the patient today.  Concerns regarding medicines are outlined above.   Tests Ordered: No orders of the defined types were placed in this encounter.   Medication Changes: No orders of the defined types were placed in this encounter.   Follow Up:  Either In Person or Virtual in 6 month(s)  Signed, Sanda Klein, MD  08/16/2019 4:36 PM    [place]

## 2019-08-21 ENCOUNTER — Telehealth: Payer: Self-pay | Admitting: Cardiovascular Disease

## 2019-08-21 ENCOUNTER — Telehealth: Payer: Self-pay

## 2019-08-21 NOTE — Telephone Encounter (Signed)
Patient's daughter is returning call.

## 2019-08-21 NOTE — Telephone Encounter (Signed)
Spoke with patient's daughter. Blood pressure readings are after being restarted on Candesartan. Per Dr. Sallyanne Kuster continue on current medications. Daughter verbalized understanding.

## 2019-08-21 NOTE — Telephone Encounter (Signed)
Patient's daughter calling in to report recent BP readings that Dr. Sallyanne Kuster requested:   08/17/19: 115/78 HR 70 08/18/19: 109/80 HR 77 08/19/19: 126/70 HR 82 08/20/19: 140/75 HR 70

## 2019-08-21 NOTE — Telephone Encounter (Signed)
Left message to call back  

## 2019-08-21 NOTE — Telephone Encounter (Signed)
Thanks. Keep on current meds.

## 2019-08-21 NOTE — Telephone Encounter (Signed)
Yes take aspirin 81mg  if cannot get xarelto - but this is really not sufficient to treat DVT which is why we are on it.  Would suggest they call cardiology office to see if they have samples until 09/04/2019.  As far as invokana, ok to take metformin XR 2 in am and 1 in pm until she can fill invokana again.

## 2019-08-21 NOTE — Telephone Encounter (Signed)
Meredith Gutierrez (DPR signed) said that she just noticed that the Invokana and Xarelto was not in pts med box. Last time pt received was 08/19/19. Pt has not had BS cked since 08/19/19 and Meredith Gutierrez cannot remember what BS was on 08/19/19 but she remembers it was OK. Meredith Gutierrez has searched pts home and cannot find Invokana or Xarelto. Meredith Gutierrez said both meds were too expensive to pay for out of pocket and cannot get until 09/04/19 with insurance paying. Meredith Gutierrez wants to know if we can give pt samples until 09/04/19. Advised we no longer have samples available. Meredith Gutierrez wants to know if Dr Darnell Level would advise pt could take the ASA 81 mg until 09/04/19 when can restart the xarelto and also can increase metformin until can get invokana refilled 09/04/19. Meredith Gutierrez request cb from [facility] CMA after Dr Darnell Level reviews note.[facility] in [place].

## 2019-08-21 NOTE — Telephone Encounter (Signed)
LVM2CB 3/9

## 2019-08-21 NOTE — Telephone Encounter (Signed)
Noted! Thank you

## 2019-08-22 NOTE — Telephone Encounter (Signed)
Spoke with pt's daughter, [name], relaying Dr. Synthia Innocent message.  Verbalizes understanding and expresses her sincere thanks.

## 2019-08-23 ENCOUNTER — Telehealth: Payer: Self-pay

## 2019-08-23 NOTE — Telephone Encounter (Signed)
Faxed patient's H & P back to hospital anesthesia at [phone]

## 2019-08-24 ENCOUNTER — Other Ambulatory Visit: Payer: Self-pay | Admitting: Family Medicine

## 2019-08-28 ENCOUNTER — Encounter: Payer: Self-pay | Admitting: Neurology

## 2019-08-28 ENCOUNTER — Ambulatory Visit: Payer: Medicare HMO | Admitting: Neurology

## 2019-08-28 VITALS — BP 168/76 | Temp 97.0°F | Ht 58.5 in | Wt 129.5 lb

## 2019-08-28 DIAGNOSIS — F0391 Unspecified dementia with behavioral disturbance: Secondary | ICD-10-CM | POA: Diagnosis not present

## 2019-08-28 DIAGNOSIS — F03918 Unspecified dementia, unspecified severity, with other behavioral disturbance: Secondary | ICD-10-CM

## 2019-08-28 MED ORDER — QUETIAPINE FUMARATE 25 MG PO TABS: 50.0000 mg | ORAL_TABLET | Freq: Every day | ORAL | 11 refills | Status: DC

## 2019-08-28 NOTE — Progress Notes (Signed)
error 

## 2019-08-28 NOTE — Progress Notes (Signed)
PATIENT: Meredith Gutierrez DOB: [birth year]  Chief Complaint  Patient presents with  . Dementia    MMSE 10/30 - 2 animals. She is here with her daughter, [name], for worsening memory loss. She has a pending MRI brain w/wo scheduled on 09/04/19.   Marland Kitchen PCP    Ria Bush, MD     HISTORICAL  Meredith Gutierrez is a 79 year old female, seen in request by her primary care physician Dr. Ria Bush for evaluation of dementia, initial evaluation was on August 28, 2019.  I have reviewed and summarized the referring note from the referring physician.  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, she was noted to have gradual onset of memory loss since 2017, gradually getting worse, had 24-hour care since 2019, she had significant worsening of memory loss, gait abnormality, hallucinations since her hospital admission for Covid virus pneumonia on June 27, 2019  She is now back home, has evening time agitation, frequent pacing around, difficulty sleeping, carry on a conversation during her hallucinations, noticed more difficulty walking, dragging her right leg more,  Previously she has tried Aricept did not notice any improvement,  I personally reviewed CT head without contrast in July 2020, chronic atrophy, supratentorium small vessel disease   Laboratory evaluations in 2021 showed normal B12, elevated triglyceride 202, CMP showed mild elevated glucose 123, normal CBC, TSH, A1c  7.2  REVIEW OF SYSTEMS: Full 14 system review of systems performed and notable only for as above All other review of systems were negative.  ALLERGIES: Allergies  Allergen Reactions  . Codeine Anaphylaxis and Shortness Of Breath  . Prednisolone Acetate Shortness Of Breath and Nausea Only  . Prednisone Hives, Shortness Of Breath and  Swelling  . Nitroglycerin Other (See Comments)    Reaction not recalled   . Metolazone Other (See Comments)    Hypokalemia, hyponatremia  . Adhesive [Tape] Rash and Other (See Comments)    SKIN IS VERY SENSITIVE- TEARS AND BRUISES EASILY, also  . Crestor [Rosuvastatin Calcium] Other (See Comments)    Weakness with statins  . Lopressor [Metoprolol Tartrate] Other (See Comments)    Bradycardia   . Sulfamethoxazole Hives and Swelling  . Zetia [Ezetimibe] Other (See Comments)    Weakness     HOME MEDICATIONS: Current Outpatient Medications  Medication Sig Dispense Refill  . ACCU-CHEK FASTCLIX LANCETS MISC 1 each by Does not apply route daily. Use as directed to check blood sugar once daily.  Dx code:  E11.65 100 each 0  . albuterol (PROVENTIL) (2.5 MG/3ML) 0.083% nebulizer solution Take 3 mLs (2.5 mg total) by nebulization every 6 (six) hours as needed for wheezing or shortness of breath. 75 mL 1  . Alcohol Swabs (B-D SINGLE USE SWABS REGULAR) PADS 1 each by Does not apply route daily. Use as directed to check blood sugar once daily.  Dx code:  E11.65 100 each 0  . Blood Glucose Monitoring Suppl (ACCU-CHEK AVIVA PLUS) w/Device KIT 1 each by Does not apply route daily. Use as directed to check blood sugar once daily.  Dx code:  E11.65 1 kit 0  . canagliflozin (INVOKANA) 100 MG TABS tablet Take 1 tablet (100 mg total) by mouth daily before breakfast. 30 tablet 11  . candesartan (ATACAND) 4 MG tablet Take 1 tablet (4 mg total) by  mouth daily. 30 tablet 3  . Cholecalciferol (VITAMIN D) 2000 units CAPS Take 1 capsule (2,000 Units total) by mouth daily. 30 capsule   . Cyanocobalamin (B-12) 1000 MCG SUBL Place 1 tablet under the tongue daily.    . furosemide (LASIX) 40 MG tablet Take 1 tablet (40 mg total) by mouth daily as needed. Take 120 mg (3 tablets) once daily for a weight of 135 pounds and over and take 80 mg (2 tablets) once daily for a weight of 134 and below.    Marland Kitchen glucose blood  (ACCU-CHEK AVIVA PLUS) test strip 1 each by Other route as needed for other. Use as instructed to check blood sugar once daily.  Dx code:  E11.65 100 each 0  . LORazepam (ATIVAN) 0.5 MG tablet TAKE 1 TABLET BY MOUTH DAILY AT BEDTIME AS NEEDED FOR ANXIETY OR SLEEP MAY TAKE SECOND 1/2 TABLET AS NEEDED (Patient taking differently: Take 0.5 mg by mouth at bedtime as needed for anxiety or sleep. ) 45 tablet 0  . memantine (NAMENDA) 5 MG tablet Take 1 tablet (5 mg total) by mouth 2 (two) times daily. First 4 days take 1 tablet daily 60 tablet 3  . metFORMIN (GLUCOPHAGE-XR) 500 MG 24 hr tablet Take 2 tablets (1,000 mg total) by mouth daily with breakfast. 180 tablet 1  . morphine (MS CONTIN) 15 MG 12 hr tablet Take 1 tablet (15 mg total) by mouth See admin instructions. (Patient taking differently: Take 15 mg by mouth every 12 (twelve) hours. ) 30 tablet 0  . morphine (MSIR) 15 MG tablet Take 15 mg by mouth every 6 (six) hours as needed for severe pain.     No current facility-administered medications for this visit.    PAST MEDICAL HISTORY: Past Medical History:  Diagnosis Date  . Angina    never needed to take nitroglycerin.  . Anxiety   . Blood transfusion without reported diagnosis   . Cerebral atherosclerosis   . Community acquired pneumonia of left lower lobe of lung 12/20/2018  . COPD (chronic obstructive pulmonary disease) ([place])    02-16-13-"pt. denies this"-shows on CXR of 2- 2013.  Marland Kitchen Coronary artery disease 2003   s/p CABG 2014  . COVID-19 virus infection 06/2019   Covid-19 PNA  . Dementia ([place])   . Diabetes mellitus without complication ([place]) 42/8768   NEW ONSET  . Diastolic CHF, on admit treated. 08/10/2011  . Family history of adverse reaction to anesthesia    Dad and Sister had trouble waking up  . Fibromuscular dysplasia of renal artery ([facility])    right  . Heart murmur   . History of kidney stones 02-16-13   "hx. of overgrowth of muscle-causing some stricture"renal artery  stenosis-being followed [facility]  . Hyperlipidemia   . Hypertension   . Hyperthyroidism    [facility]  . Hyponatremia 04/2018  . Melanoma ([place]) 2010   skin cancers, 1 melanoma removed back  . Osteoarthritis   . Pneumonia due to COVID-19 virus 06/26/2019    PAST SURGICAL HISTORY: Past Surgical History:  Procedure Laterality Date  . ABDOMINAL HYSTERECTOMY  1970   heavy bleeding  . APPENDECTOMY  1960s  . CARDIAC CATHETERIZATION  08/09/2011   no sign. coronary obstruction. LIMA is atretic w/excellent flow down the native LAD  . CARDIAC CATHETERIZATION  08/22/2001   no sign. coronary stenosis,  . CATARACT EXTRACTION, BILATERAL Bilateral   . CESAREAN SECTION    . CORONARY ARTERY BYPASS GRAFT  02-16-13   3'03-no bypasses -  Had 2 aneurysm repairs(stents used)  . ESOPHAGOGASTRODUODENOSCOPY  04/2018   LA Grade A esophagitis, small nodule distal esophagus (benign schwann cell hamartoma), empiric dilation to 68m ([facility])  . LAPAROTOMY N/A 02/20/2013   Procedure: EXPLORATORY LAPAROTOMY;  WJanie Morning MD  . LEFT HEART CATHETERIZATION WITH CORONARY ANGIOGRAM N/A 08/09/2011   Procedure: LEFT HEART CATHETERIZATION WITH CORONARY ANGIOGRAM;  Surgeon: MSanda Klein MD;  [facility];  Service: Cardiovascular;  Laterality: N/A;  . NM MYOCAR PERF WALL MOTION  07/20/2010   normal  . RADIOLOGY WITH ANESTHESIA N/A 02/22/2019   Procedure: MRI OF ABDOMEN WITH AND WITHOUT CONTRAST;  Surgeon: Radiologist, Medication, MD;  [facility]  Service: Radiology;  Laterality: N/A;  . SALPINGOOPHORECTOMY Bilateral 02/20/2013   benign seromucinous cystadenofibroma R ovary ([name] MD)  . TOOTH EXTRACTION    . TOTAL HIP ARTHROPLASTY Left 2015    FAMILY HISTORY: Family History  Problem Relation Age of Onset  . Stroke Mother   . Hypotension Mother   . Dementia Mother 628 . Coronary artery disease Father   . Hypertension Father   . Breast cancer Maternal Aunt   . Hypotension Sister   . Hypertension  Brother   . Diabetes Brother   . Lung cancer Daughter   . Thyroid cancer Daughter   . Colon polyps Maternal Grandfather   . Colon cancer Neg Hx   . Esophageal cancer Neg Hx   . Stomach cancer Neg Hx   . Rectal cancer Neg Hx     SOCIAL HISTORY: Social History   Socioeconomic History  . Marital status: Widowed    Spouse name: Not on file  . Number of children: 2  . Years of education: 137 . Highest education level: High school graduate  Occupational History  . Occupation: Retired  Tobacco Use  . Smoking status: Former Smoker    Packs/day: 0.10    Years: 20.00    Pack years: 2.00    Types: Cigarettes    Quit date: 09/14/2017    Years since quitting: 1.9  . Smokeless tobacco: Never Used  . Tobacco comment: smoking cessation info given  Substance and Sexual Activity  . Alcohol use: Yes    Comment: occasional  . Drug use: No  . Sexual activity: Not on file  Other Topics Concern  . Not on file  Social History Narrative   Widow   Lives with daughter and daughter's GF   Edu: HS   Occ: retired, worked at [employer]  Activity: no regular exercise   Diet: good water    Right-handed.   Occasional caffeine use.   Social Determinants of Health   Financial Resource Strain: Low Risk   . Difficulty of Paying Living Expenses: Not hard at all  Food Insecurity: No Food Insecurity  . Worried About RCharity fundraiserin the Last Year: Never true  . Ran Out of Food in the Last Year: Never true  Transportation Needs: No Transportation Needs  . Lack of Transportation (Medical): No  . Lack of Transportation (Non-Medical): No  Physical Activity: Inactive  . Days of Exercise per Week: 0 days  . Minutes of Exercise per Session: 0 min  Stress: No Stress Concern Present  . Feeling of Stress : Not at all  Social Connections:   . Frequency of Communication with Friends and Family:   . Frequency of Social Gatherings with Friends and Family:   . Attends Religious Services:   . Active Member  of Clubs or Organizations:   . Attends Archivist Meetings:   Marland Kitchen Marital Status:   Intimate Partner Violence: Not At Risk  . Fear of Current or Ex-Partner: No  . Emotionally Abused: No  . Physically Abused: No  . Sexually Abused: No     PHYSICAL EXAM   Vitals:   08/28/19 1402  Temp: (!) 97 F (36.1 C)  Weight: 129 lb 8 oz (58.7 kg)  Height: 4' 10.5" (1.486 m)    Not recorded      Body mass index is 26.6 kg/m.  PHYSICAL EXAMNIATION:  Gen: NAD, conversant, well nourised, well groomed                     Cardiovascular: Regular rate rhythm, no peripheral edema, warm, nontender. Eyes: Conjunctivae clear without exudates or hemorrhage Neck: Supple, no carotid bruits. Pulmonary: Clear to auscultation bilaterally   NEUROLOGICAL EXAM:  MMSE - Mini Mental State Exam 08/28/2019 04/23/2019 04/07/2018  Not completed: - Unable to complete (No Data)  Orientation to time 1 - -  Orientation to Place 1 - -  Registration 3 - -  Attention/ Calculation 0 - -  Recall 0 - -  Recall-comments - - -  Language- name 2 objects 1 - -  Language- repeat 0 - -  Language- follow 3 step command 2 - -  Language- follow 3 step command-comments - - -  Language- read & follow direction 1 - -  Write a sentence 1 - -  Copy design 0 - -  Total score 10 - -  Animal naming 2   CRANIAL NERVES: CN II: Visual fields are full to confrontation. Pupils are round equal and briskly reactive to light. CN III, IV, VI: extraocular movement are normal. No ptosis. CN V: Facial sensation is intact to light touch CN VII: Face is symmetric with normal eye closure  CN VIII: Hearing is normal to causal conversation. CN IX, X: Phonation is normal. CN XI: Head turning and shoulder shrug are intact  MOTOR: There is no pronator drift of out-stretched arms. Muscle bulk and tone are normal. Muscle strength is normal.  REFLEXES: Reflexes are hypoactive and symmetric at the biceps, triceps, knees, and ankles.  Plantar responses are flexor.  SENSORY: Intact to light touch,  COORDINATION: There is no trunk or limb dysmetria noted.  GAIT/STANCE: She needs push-up to get up from seated position, dragging her right leg some   DIAGNOSTIC DATA (LABS, IMAGING, TESTING) - I reviewed patient records, labs, notes, testing and imaging myself where available.   ASSESSMENT AND PLAN  Chenita A Turri is a 79 y.o. female   Dementia with behavioral issues  She has frequent hallucinations, difficulty sleeping at nighttime  Will start on Seroquel 25 mg, may titrating to 50 mg every night  Keep Namenda 5 mg twice a day  MRI of brain is pending for September 03, 2019  Marcial Pacas, M.D. Ph.D.  [facility] [address] [place], [place] [address] Ph: [phone] Fax: [phone]  CC: Ria Bush, MD

## 2019-09-01 ENCOUNTER — Ambulatory Visit (HOSPITAL_COMMUNITY)
Admission: RE | Admit: 2019-09-01 | Discharge: 2019-09-01 | Disposition: A | Discharge status: Home / Self Care | Payer: Medicare HMO | Source: Ambulatory Visit | Attending: Family Medicine | Admitting: Family Medicine

## 2019-09-01 NOTE — Progress Notes (Signed)
Pt tested positive 06/26/19. Pt will not be re-tested prior to procedure because this falls within the 90-day window.    Status:  Final result  Visible to patient:  No (inaccessible in MyChart)  Next appt:  Today at 12:05 PM in [facility] ([facility])  Ref Range & Units 2 mo ago  SARS Coronavirus 2 Ag NEGATIVE POSITIVEAbnormal    Comment: (NOTE)  SARS-CoV-2 antigen PRESENT.  Positive results indicate the presence of viral antigens, but  clinical correlation with patient history and other diagnostic  information is necessary to determine patient infection status.  Positive results do not rule out bacterial infection or co-infection  with other viruses. False positive results are rare but can occur,  and confirmatory RT-PCR testing may be appropriate in some  circumstances. The expected result is Negative.  Fact Sheet for Patients: PodPark.tn  Fact Sheet for Providers: GiftContent.is  This test is not yet approved or cleared by the [place] FDA and  has been authorized for detection and/or diagnosis of SARS-CoV-2 by  FDA under an Emergency Use Authorization (EUA). This EUA will  remain in effect (meaning this test can be used) for the duration of  the COVID-19 declaration under Section 564(b)(1) of the Act, 21  U.S.C. section 360bbb-3(b)(1), unless the authorization is  terminated or revoked sooner.

## 2019-09-03 ENCOUNTER — Encounter (HOSPITAL_COMMUNITY): Payer: Self-pay

## 2019-09-03 NOTE — Progress Notes (Signed)
Nurse spoke with pt daughter, [name], regarding Xarelto. Daughter stated that Xarelto was accidentally discarded three weeks ago and a refill will be picked up on 09/04/19 and started immediately. Daughter stated that doctor is aware and pt was instructed to continue taking Aspirin.  Karoline Caldwell, PA, Anesthesiology, made aware.

## 2019-09-03 NOTE — Anesthesia Preprocedure Evaluation (Addendum)
Anesthesia Evaluation  Patient identified by MRN, date of birth, ID band Patient awake and Patient confused    Reviewed: Allergy & Precautions, NPO status , Patient's Chart, lab work & pertinent test results  History of Anesthesia Complications Negative for: history of anesthetic complications  Airway Mallampati: II  TM Distance: >3 FB Neck ROM: Full    Dental no notable dental hx.    Pulmonary COPD,  COPD inhaler, former smoker,  Covid pna 06/2019   Pulmonary exam normal        Cardiovascular hypertension, Pt. on medications + CAD, + CABG (2014) and +CHF  Normal cardiovascular exam  Ech 12/2018: EF 60-65%, mild LVH, impaired relaxation, moderate LAE/RAE, mild dilatation of the ascending aorta measuring 5mm   Neuro/Psych Anxiety Dementia negative neurological ROS     GI/Hepatic Neg liver ROS, Chronic pancreatitis   Endo/Other  diabetes, Type 2, Oral Hypoglycemic AgentsHyperthyroidism   Renal/GU negative Renal ROS  negative genitourinary   Musculoskeletal  (+) Arthritis ,   Abdominal   Peds  Hematology negative hematology ROS (+)   Anesthesia Other Findings Day of surgery medications reviewed with patient.  Reproductive/Obstetrics negative OB ROS                          Anesthesia Physical Anesthesia Plan  ASA: III  Anesthesia Plan: General   Post-op Pain Management:    Induction: Intravenous  PONV Risk Score and Plan: 3 and Treatment may vary due to age or medical condition and Ondansetron  Airway Management Planned: Oral ETT  Additional Equipment: None  Intra-op Plan:   Post-operative Plan: Extubation in OR  Informed Consent: I have reviewed the patients History and Physical, chart, labs and discussed the procedure including the risks, benefits and alternatives for the proposed anesthesia with the patient or authorized representative who has indicated his/her understanding  and acceptance.     Dental advisory given and Consent reviewed with POA  Plan Discussed with: CRNA  Anesthesia Plan Comments: (Consent reviewed with patient's daughter present in preop. Meredith Huge, MD  Follows with cardiology for hx of CAD CAD S/P CABG (2003 LIMA to LAD and SVG to first diagonal, ligation of large LAD artery aneurysm, atretic LIMA with patent LAD at cardiac cath in 2013), without coronary events since that time, hyperdynamic left ventricular systolic function, moderate LVH, chronic diastolic heart failure, right renal artery stenosis, HTN hyperlipidemia intolerant to lipid lowering therapy. She is unable to take beta blockers due to bradycardia.   Last seen by Dr. Sallyanne Kuster via televisit 08/16/19. Per note, "From a cardiovascular symptom point of view she is doing well, without any angina or dyspnea either at rest or with activity.  She does not have orthopnea or PND and has at most occasional trace ankle edema.  She has not had syncope or palpitations and denies new focal neurological complaints but is increasingly unsteady when walking."  Pt had COVID-19 infection in January and developed a left popliteal DVT and was started on Xarelto on January 13. 3 months of anticoagulation recommended. Per daughter, pt did have an interruption of anticoagulation due to accidentally discarding the medication. Will be picking up refill 3/23 and restarting. Discussed with Dr. Kalman Shan. Ok to proceed barring acute status change.   Per neurology notes she has had significant worsening of memory loss, gait abnormality, hallucinations since Covid diagnosis.   Neurology note by Dr. Krista Blue 08/28/19 to serve as H&P for procedure.   DMII last A1c 6.9  on 06/26/19.  Will need DOS labs and eval.   EKG 06/26/19: Sinus rhythm. Rate 87. Low voltage, precordial leads. Consider anterior infarct. Does not appear significantly changed compared to prior tracings.   TTE 12/21/18: 1. The left ventricle has normal systolic  function with an ejection  fraction of 60-65%. The cavity size was normal. There is mild concentric  left ventricular hypertrophy. Left ventricular diastolic Doppler  parameters are consistent with impaired  relaxation. Elevated left ventricular end-diastolic pressure The E/e' is  21. No evidence of left ventricular regional wall motion abnormalities.  2. The right ventricle has normal systolic function. The cavity was  normal. There is no increase in right ventricular wall thickness. Right  ventricular systolic pressure is normal with an estimated pressure of 28.1  mmHg.  3. Left atrial size was moderately dilated.  4. Right atrial size was moderately dilated.  5. Mild thickening of the mitral valve leaflet. Mild calcification of the  mitral valve leaflet. There is mild mitral annular calcification present.  No evidence of mitral valve stenosis.  6. Moderate thickening of the aortic valve. Moderate calcification of the  aortic valve. No stenosis of the aortic valve.  7. The aortic root is normal in size and structure.  8. There is mild dilatation of the ascending aorta measuring 41 mm.  9. The inferior vena cava was dilated in size with >50% respiratory  variability.  )     Anesthesia Quick Evaluation

## 2019-09-03 NOTE — Progress Notes (Addendum)
Anesthesia Chart Review:  Follows with cardiology for hx of CAD CAD S/P CABG (2003 LIMA to LAD and SVG to first diagonal, ligation of large LAD artery aneurysm, atretic LIMA with patent LAD at cardiac cath in 2013), without coronary events since that time, hyperdynamic left ventricular systolic function, moderate LVH, chronic diastolic heart failure, right renal artery stenosis, HTN hyperlipidemia intolerant to lipid lowering therapy. She is unable to take beta blockers due to bradycardia.   Last seen by Dr. Sallyanne Kuster via televisit 08/16/19. Per note, "From a cardiovascular symptom point of view she is doing well, without any angina or dyspnea either at rest or with activity.  She does not have orthopnea or PND and has at most occasional trace ankle edema.  She has not had syncope or palpitations and denies new focal neurological complaints but is increasingly unsteady when walking."  Pt had COVID-19 infection in January and developed a left popliteal DVT and was started on Xarelto on January 13. 3 months of anticoagulation recommended. Per daughter, pt did have an interruption of anticoagulation due to accidentally discarding the medication. Will be picking up refill 3/23 and restarting. Discussed with Dr. Kalman Shan. Ok to proceed barring acute status change.   Per neurology notes she has had significant worsening of memory loss, gait abnormality, hallucinations since Covid diagnosis.   Neurology note by Dr. Krista Blue 08/28/19 to serve as H&P for procedure.   DMII last A1c 6.9 on 06/26/19.  Will need DOS labs and eval.   EKG 06/26/19: Sinus rhythm. Rate 87. Low voltage, precordial leads. Consider anterior infarct. Does not appear significantly changed compared to prior tracings.   TTE 12/21/18: 1. The left ventricle has normal systolic function with an ejection  fraction of 60-65%. The cavity size was normal. There is mild concentric  left ventricular hypertrophy. Left ventricular diastolic Doppler  parameters  are consistent with impaired  relaxation. Elevated left ventricular end-diastolic pressure The E/e' is  21. No evidence of left ventricular regional wall motion abnormalities.  2. The right ventricle has normal systolic function. The cavity was  normal. There is no increase in right ventricular wall thickness. Right  ventricular systolic pressure is normal with an estimated pressure of 28.1  mmHg.  3. Left atrial size was moderately dilated.  4. Right atrial size was moderately dilated.  5. Mild thickening of the mitral valve leaflet. Mild calcification of the  mitral valve leaflet. There is mild mitral annular calcification present.  No evidence of mitral valve stenosis.  6. Moderate thickening of the aortic valve. Moderate calcification of the  aortic valve. No stenosis of the aortic valve.  7. The aortic root is normal in size and structure.  8. There is mild dilatation of the ascending aorta measuring 41 mm.  9. The inferior vena cava was dilated in size with >50% respiratory  variability.     Wynonia Musty [facility]/Anesthesiology Phone [phone] 09/03/2019 2:00 PM

## 2019-09-04 ENCOUNTER — Telehealth: Payer: Self-pay

## 2019-09-04 ENCOUNTER — Encounter (HOSPITAL_COMMUNITY)
Admission: RE | Disposition: A | Discharge status: Home / Self Care | Payer: Self-pay | Source: Home / Self Care | Attending: Family Medicine

## 2019-09-04 ENCOUNTER — Ambulatory Visit (HOSPITAL_COMMUNITY): Payer: Medicare HMO

## 2019-09-04 ENCOUNTER — Ambulatory Visit (HOSPITAL_COMMUNITY)
Admission: RE | Admit: 2019-09-04 | Discharge: 2019-09-04 | Disposition: A | Discharge status: Home / Self Care | Payer: Medicare HMO | Source: Ambulatory Visit | Attending: Family Medicine | Admitting: Family Medicine

## 2019-09-04 ENCOUNTER — Encounter (HOSPITAL_COMMUNITY): Payer: Self-pay

## 2019-09-04 ENCOUNTER — Other Ambulatory Visit: Payer: Self-pay

## 2019-09-04 ENCOUNTER — Ambulatory Visit (HOSPITAL_COMMUNITY)
Admission: RE | Admit: 2019-09-04 | Discharge: 2019-09-04 | Disposition: A | Discharge status: Home / Self Care | Payer: Medicare HMO | Source: Ambulatory Visit | Attending: Gastroenterology | Admitting: Gastroenterology

## 2019-09-04 ENCOUNTER — Ambulatory Visit (HOSPITAL_COMMUNITY)
Admission: RE | Admit: 2019-09-04 | Discharge: 2019-09-04 | Disposition: A | Discharge status: Home / Self Care | Payer: Medicare HMO | Attending: Family Medicine | Admitting: Family Medicine

## 2019-09-04 DIAGNOSIS — M1990 Unspecified osteoarthritis, unspecified site: Secondary | ICD-10-CM | POA: Insufficient documentation

## 2019-09-04 DIAGNOSIS — Z79899 Other long term (current) drug therapy: Secondary | ICD-10-CM | POA: Insufficient documentation

## 2019-09-04 DIAGNOSIS — G3183 Dementia with Lewy bodies: Secondary | ICD-10-CM

## 2019-09-04 DIAGNOSIS — E785 Hyperlipidemia, unspecified: Secondary | ICD-10-CM | POA: Insufficient documentation

## 2019-09-04 DIAGNOSIS — K861 Other chronic pancreatitis: Secondary | ICD-10-CM | POA: Diagnosis not present

## 2019-09-04 DIAGNOSIS — E119 Type 2 diabetes mellitus without complications: Secondary | ICD-10-CM | POA: Insufficient documentation

## 2019-09-04 DIAGNOSIS — I509 Heart failure, unspecified: Secondary | ICD-10-CM | POA: Insufficient documentation

## 2019-09-04 DIAGNOSIS — J449 Chronic obstructive pulmonary disease, unspecified: Secondary | ICD-10-CM | POA: Diagnosis not present

## 2019-09-04 DIAGNOSIS — F419 Anxiety disorder, unspecified: Secondary | ICD-10-CM | POA: Insufficient documentation

## 2019-09-04 DIAGNOSIS — J1282 Pneumonia due to coronavirus disease 2019: Secondary | ICD-10-CM | POA: Diagnosis not present

## 2019-09-04 DIAGNOSIS — D3502 Benign neoplasm of left adrenal gland: Secondary | ICD-10-CM | POA: Insufficient documentation

## 2019-09-04 DIAGNOSIS — Z7982 Long term (current) use of aspirin: Secondary | ICD-10-CM | POA: Diagnosis not present

## 2019-09-04 DIAGNOSIS — F0390 Unspecified dementia without behavioral disturbance: Secondary | ICD-10-CM | POA: Diagnosis not present

## 2019-09-04 DIAGNOSIS — R935 Abnormal findings on diagnostic imaging of other abdominal regions, including retroperitoneum: Secondary | ICD-10-CM | POA: Diagnosis not present

## 2019-09-04 DIAGNOSIS — Z7984 Long term (current) use of oral hypoglycemic drugs: Secondary | ICD-10-CM | POA: Insufficient documentation

## 2019-09-04 DIAGNOSIS — Z951 Presence of aortocoronary bypass graft: Secondary | ICD-10-CM | POA: Insufficient documentation

## 2019-09-04 DIAGNOSIS — Z87891 Personal history of nicotine dependence: Secondary | ICD-10-CM | POA: Diagnosis not present

## 2019-09-04 DIAGNOSIS — R4182 Altered mental status, unspecified: Secondary | ICD-10-CM | POA: Diagnosis not present

## 2019-09-04 DIAGNOSIS — I110 Hypertensive heart disease with heart failure: Secondary | ICD-10-CM | POA: Diagnosis not present

## 2019-09-04 DIAGNOSIS — I700 Atherosclerosis of aorta: Secondary | ICD-10-CM | POA: Insufficient documentation

## 2019-09-04 DIAGNOSIS — U071 COVID-19: Secondary | ICD-10-CM | POA: Diagnosis not present

## 2019-09-04 DIAGNOSIS — I2510 Atherosclerotic heart disease of native coronary artery without angina pectoris: Secondary | ICD-10-CM | POA: Insufficient documentation

## 2019-09-04 HISTORY — PX: RADIOLOGY WITH ANESTHESIA: SHX6223

## 2019-09-04 LAB — GLUCOSE, CAPILLARY
Glucose-Capillary: 116 mg/dL — ABNORMAL HIGH (ref 70–99)
Glucose-Capillary: 128 mg/dL — ABNORMAL HIGH (ref 70–99)

## 2019-09-04 SURGERY — MRI WITH ANESTHESIA
Anesthesia: General | Laterality: Bilateral

## 2019-09-04 MED ORDER — LACTATED RINGERS IV SOLN: INTRAVENOUS | Status: DC | PRN

## 2019-09-04 MED ORDER — SUCCINYLCHOLINE CHLORIDE 200 MG/10ML IV SOSY
PREFILLED_SYRINGE | INTRAVENOUS | Status: DC | PRN
  Administered 2019-09-04: 80 mg via INTRAVENOUS

## 2019-09-04 MED ORDER — ONDANSETRON HCL 4 MG/2ML IJ SOLN
INTRAMUSCULAR | Status: DC | PRN
  Administered 2019-09-04: 4 mg via INTRAVENOUS

## 2019-09-04 MED ORDER — PROPOFOL 10 MG/ML IV BOLUS
INTRAVENOUS | Status: DC | PRN
  Administered 2019-09-04: 30 mg via INTRAVENOUS
  Administered 2019-09-04: 100 mg via INTRAVENOUS
  Administered 2019-09-04: 20 mg via INTRAVENOUS

## 2019-09-04 MED ORDER — METFORMIN HCL ER 500 MG PO TB24: 1000.0000 mg | ORAL_TABLET | Freq: Every day | ORAL | 2 refills | Status: DC

## 2019-09-04 MED ORDER — SUGAMMADEX SODIUM 200 MG/2ML IV SOLN
INTRAVENOUS | Status: DC | PRN
  Administered 2019-09-04: 200 mg via INTRAVENOUS

## 2019-09-04 MED ORDER — CANAGLIFLOZIN 100 MG PO TABS: 100.0000 mg | ORAL_TABLET | Freq: Every day | ORAL | 2 refills | Status: DC

## 2019-09-04 MED ORDER — PROMETHAZINE HCL 25 MG/ML IJ SOLN: 6.2500 mg | INTRAMUSCULAR | Status: DC | PRN

## 2019-09-04 MED ORDER — FENTANYL CITRATE (PF) 100 MCG/2ML IJ SOLN: 25.0000 ug | INTRAMUSCULAR | Status: DC | PRN

## 2019-09-04 MED ORDER — PHENYLEPHRINE HCL-NACL 10-0.9 MG/250ML-% IV SOLN
INTRAVENOUS | Status: DC | PRN
  Administered 2019-09-04: 40 ug/min via INTRAVENOUS

## 2019-09-04 MED ORDER — LIDOCAINE 2% (20 MG/ML) 5 ML SYRINGE
INTRAMUSCULAR | Status: DC | PRN
  Administered 2019-09-04: 80 mg via INTRAVENOUS

## 2019-09-04 MED ORDER — FENTANYL CITRATE (PF) 100 MCG/2ML IJ SOLN
INTRAMUSCULAR | Status: DC | PRN
  Administered 2019-09-04 (×2): 25 ug via INTRAVENOUS

## 2019-09-04 MED ORDER — LACTATED RINGERS IV SOLN: INTRAVENOUS | Status: DC

## 2019-09-04 MED ORDER — ROCURONIUM BROMIDE 50 MG/5ML IV SOSY
PREFILLED_SYRINGE | INTRAVENOUS | Status: DC | PRN
  Administered 2019-09-04: 50 mg via INTRAVENOUS

## 2019-09-04 MED ORDER — DOXYCYCLINE HYCLATE 100 MG PO TABS: 100.0000 mg | ORAL_TABLET | Freq: Two times a day (BID) | ORAL | 0 refills | Status: DC

## 2019-09-04 MED ORDER — GADOBUTROL 1 MMOL/ML IV SOLN
6.0000 mL | Freq: Once | INTRAVENOUS | Status: AC | PRN
  Administered 2019-09-04: 6 mL via INTRAVENOUS

## 2019-09-04 MED ORDER — PHENYLEPHRINE 40 MCG/ML (10ML) SYRINGE FOR IV PUSH (FOR BLOOD PRESSURE SUPPORT)
PREFILLED_SYRINGE | INTRAVENOUS | Status: DC | PRN
  Administered 2019-09-04 (×2): 80 ug via INTRAVENOUS

## 2019-09-04 NOTE — Addendum Note (Signed)
Addendum  created 09/04/19 1033 by Orlie Dakin, CRNA   Flowsheet accepted, Intraprocedure Event edited, Intraprocedure Flowsheets edited

## 2019-09-04 NOTE — Transfer of Care (Signed)
Immediate Anesthesia Transfer of Care Note  Patient: Meredith Gutierrez  Procedure(s) Performed: MRI WITH ANESTHESIA OF ABDOMEN WITH AND WITHOUT CONTRAST, BRAIN WITHOUT CONTRAST (Bilateral )  Patient Location: PACU  Anesthesia Type:General  Level of Consciousness: awake and patient cooperative  Airway & Oxygen Therapy: Patient Spontanous Breathing and Patient connected to nasal cannula oxygen  Post-op Assessment: Report given to RN and Post -op Vital signs reviewed and stable  Post vital signs: Reviewed and stable  Last Vitals:  Vitals Value Taken Time  BP 133/79 09/04/19 0947  Temp 36.7 C 09/04/19 0947  Pulse 76 09/04/19 0955  Resp 17 09/04/19 0955  SpO2 99 % 09/04/19 0955  Vitals shown include unvalidated device data.  Last Pain:  Vitals:   09/04/19 0600  TempSrc: Oral      Patients Stated Pain Goal: (unable to assess) ([id number])  Complications: No apparent anesthesia complications

## 2019-09-04 NOTE — Telephone Encounter (Signed)
Ok to send doxycycline course to pharmacy. Treat with warm compresses TID  If not improving or coming to a head, please schedule appt.

## 2019-09-04 NOTE — Telephone Encounter (Signed)
E-scribed refills.  

## 2019-09-04 NOTE — Anesthesia Postprocedure Evaluation (Addendum)
Anesthesia Post Note  Patient: Meredith Gutierrez  Procedure(s) Performed: MRI WITH ANESTHESIA OF ABDOMEN WITH AND WITHOUT CONTRAST, BRAIN WITHOUT CONTRAST (Bilateral )     Patient location during evaluation: PACU Anesthesia Type: General Level of consciousness: awake and alert and confused Pain management: pain level controlled Vital Signs Assessment: post-procedure vital signs reviewed and stable Respiratory status: spontaneous breathing, nonlabored ventilation and respiratory function stable Cardiovascular status: blood pressure returned to baseline Postop Assessment: no apparent nausea or vomiting Anesthetic complications: no    Last Vitals:  Vitals:   09/04/19 1000 09/04/19 1015  BP: 133/76   Pulse: 80 77  Resp: 18 16  Temp:    SpO2: 99% (P) 94%    Last Pain:  Vitals:   09/04/19 0600  TempSrc: Oral                 Brennan Bailey

## 2019-09-04 NOTE — Telephone Encounter (Signed)
Carol(DPR signed) said that pt has a hx of sores on her buttock and Dr Danise Mina usually gives an abx. [name] cannot remember last time pt had a sore on buttock or name of abx given. Pt has no fever but reddened sore in middle of buttock, there is a hardened area under the skin, no drainage but painful (pain level now is 8). Pt had medicare wellness visit on 08/13/2019. [name] said if not treated with abx another sore will form. [name] request cb after Dr Danise Mina has reviewed note. [facility]

## 2019-09-04 NOTE — Anesthesia Procedure Notes (Addendum)
Procedure Name: Intubation Date/Time: 09/04/2019 8:17 AM Performed by: Brennan Bailey, MD Pre-anesthesia Checklist: Patient identified, Emergency Drugs available, Suction available and Patient being monitored Patient Re-evaluated:Patient Re-evaluated prior to induction Oxygen Delivery Method: Circle system utilized Preoxygenation: Pre-oxygenation with 100% oxygen Induction Type: IV induction Ventilation: Mask ventilation without difficulty Laryngoscope Size: Miller and 2 Grade View: Grade I Tube type: Oral Tube size: 7.0 mm Number of attempts: 2 (first attempt by srna) Airway Equipment and Method: Stylet and Oral airway Placement Confirmation: ETT inserted through vocal cords under direct vision,  positive ETCO2 and breath sounds checked- equal and bilateral Secured at: 22 cm Tube secured with: Tape Dental Injury: Teeth and Oropharynx as per pre-operative assessment  Comments: First attempt MAC 3 esophageal intubation by SRNA, 2nd DL with miller 2 by dr. Daiva Huge

## 2019-09-05 ENCOUNTER — Encounter: Payer: Self-pay | Admitting: *Deleted

## 2019-09-05 ENCOUNTER — Other Ambulatory Visit: Payer: Self-pay

## 2019-09-05 NOTE — Telephone Encounter (Signed)
Patient's daughter left a voicemail stating that she was following up on a call she left yesterday.  Patient's daughter notified as instructed by telephone and verbalized understanding.

## 2019-09-05 NOTE — Telephone Encounter (Signed)
Last filled on 06/18/2019 #45 with 0 refill.   LOV 08/13/19  Next appointment on 09/10/19 for follow up

## 2019-09-06 MED ORDER — LORAZEPAM 0.5 MG PO TABS: ORAL_TABLET | ORAL | 0 refills | Status: DC

## 2019-09-06 NOTE — Telephone Encounter (Signed)
Erx

## 2019-09-10 ENCOUNTER — Ambulatory Visit (INDEPENDENT_AMBULATORY_CARE_PROVIDER_SITE_OTHER): Payer: Medicare HMO | Admitting: Family Medicine

## 2019-09-10 ENCOUNTER — Other Ambulatory Visit: Payer: Self-pay

## 2019-09-10 ENCOUNTER — Encounter: Payer: Self-pay | Admitting: Family Medicine

## 2019-09-10 VITALS — BP 138/80 | HR 60 | Temp 97.5°F | Ht 58.5 in | Wt 129.2 lb

## 2019-09-10 DIAGNOSIS — L0232 Furuncle of buttock: Secondary | ICD-10-CM | POA: Insufficient documentation

## 2019-09-10 DIAGNOSIS — K861 Other chronic pancreatitis: Secondary | ICD-10-CM | POA: Diagnosis not present

## 2019-09-10 DIAGNOSIS — F05 Delirium due to known physiological condition: Secondary | ICD-10-CM

## 2019-09-10 DIAGNOSIS — F0391 Unspecified dementia with behavioral disturbance: Secondary | ICD-10-CM | POA: Diagnosis not present

## 2019-09-10 DIAGNOSIS — E118 Type 2 diabetes mellitus with unspecified complications: Secondary | ICD-10-CM | POA: Diagnosis not present

## 2019-09-10 DIAGNOSIS — I10 Essential (primary) hypertension: Secondary | ICD-10-CM | POA: Diagnosis not present

## 2019-09-10 DIAGNOSIS — I824Z2 Acute embolism and thrombosis of unspecified deep veins of left distal lower extremity: Secondary | ICD-10-CM

## 2019-09-10 DIAGNOSIS — L0293 Carbuncle, unspecified: Secondary | ICD-10-CM | POA: Diagnosis not present

## 2019-09-10 DIAGNOSIS — G8929 Other chronic pain: Secondary | ICD-10-CM

## 2019-09-10 DIAGNOSIS — R160 Hepatomegaly, not elsewhere classified: Secondary | ICD-10-CM | POA: Diagnosis not present

## 2019-09-10 DIAGNOSIS — L0292 Furuncle, unspecified: Secondary | ICD-10-CM

## 2019-09-10 DIAGNOSIS — F03918 Unspecified dementia, unspecified severity, with other behavioral disturbance: Secondary | ICD-10-CM

## 2019-09-10 DIAGNOSIS — M545 Low back pain, unspecified: Secondary | ICD-10-CM

## 2019-09-10 MED ORDER — MUPIROCIN 2 % EX OINT: 1.0000 "application " | TOPICAL_OINTMENT | Freq: Two times a day (BID) | CUTANEOUS | 2 refills | Status: DC

## 2019-09-10 MED ORDER — MUPIROCIN 2 % EX OINT: 1.0000 "application " | TOPICAL_OINTMENT | Freq: Two times a day (BID) | CUTANEOUS | 0 refills | Status: DC

## 2019-09-10 NOTE — Progress Notes (Signed)
This visit was conducted in person.  BP 138/80 (BP Location: Right Arm, Patient Position: Sitting, Cuff Size: Normal)   Pulse 60   Temp (!) 97.5 F (36.4 C) (Temporal)   Ht 4' 10.5" (1.486 m)   Wt 129 lb 4 oz (58.6 kg)   SpO2 99%   BMI 26.55 kg/m     CC: 1 mo f/u visit  Subjective:    Patient ID: Meredith Gutierrez, female    DOB: 08-12-1940, 79 y.o.   MRN: [id number]  HPI: Meredith Gutierrez is a 79 y.o. female presenting on 09/10/2019 for Follow-up (Here for 4 wk f/u.  Pt accompanied by daughter, [name]- temp )   Saw neurology - started on seroquel 47m at night with significant benefit - now getting more restful sleep, less daytime naps. Planned neuro f/u in 3 months.   Reviewed recent imaging including MRI abd/MRCP and MRI brain - overall reassuring results. Stable cysts of the pancreas with chronic pancreatitis. Liver looked ok, there was a stable 2.5cm L adrenal adenoma and stable L kidney cyst. MRI showed moderate atrophy with cortical volume loss and mild ventricular enlargement, negative for acute infarct. Moderate changes throughout white matter consistent with chronic microvascular ischemia. Mild chronic ischemic changes in basal ganglia and L cerebellum.   Chronic pain - has decreased MSContin 118mto once daily instead of BID, with rare MSIR 1578m1-2 per month). Has transitioned to tylenol during the day for pain control. Doing well on this regimen. Possibly improved alertness noted with this change.  Appetite good Sugars doing well BP well controlled  Buttock boils are improving on doxycycline course. Similar episode of buttock boils 08/2018. Never tested for MRSA.      Relevant past medical, surgical, family and social history reviewed and updated as indicated. Interim medical history since our last visit reviewed. Allergies and medications reviewed and updated. Outpatient Medications Prior to Visit  Medication Sig Dispense Refill  . ACCU-CHEK FASTCLIX LANCETS MISC 1  each by Does not apply route daily. Use as directed to check blood sugar once daily.  Dx code:  E11.65 100 each 0  . albuterol (PROVENTIL) (2.5 MG/3ML) 0.083% nebulizer solution Take 3 mLs (2.5 mg total) by nebulization every 6 (six) hours as needed for wheezing or shortness of breath. 75 mL 1  . Alcohol Swabs (B-D SINGLE USE SWABS REGULAR) PADS 1 each by Does not apply route daily. Use as directed to check blood sugar once daily.  Dx code:  E11.65 100 each 0  . ascorbic acid (VITAMIN C) 500 MG tablet Take 500 mg by mouth daily.    . aMarland Kitchenpirin EC 81 MG tablet Take 81 mg by mouth daily.    . Blood Glucose Monitoring Suppl (ACCU-CHEK AVIVA PLUS) w/Device KIT 1 each by Does not apply route daily. Use as directed to check blood sugar once daily.  Dx code:  E11.65 1 kit 0  . canagliflozin (INVOKANA) 100 MG TABS tablet Take 1 tablet (100 mg total) by mouth daily before breakfast. 90 tablet 2  . cholecalciferol (VITAMIN D3) 25 MCG (1000 UNIT) tablet Take 1,000 Units by mouth daily.    . Cyanocobalamin (B-12) 1000 MCG SUBL Place 1 tablet under the tongue daily.    . dMarland Kitchenxycycline (VIBRA-TABS) 100 MG tablet Take 1 tablet (100 mg total) by mouth 2 (two) times daily. 14 tablet 0  . furosemide (LASIX) 40 MG tablet Take 1 tablet (40 mg total) by mouth daily as needed. Take 120 mg (  3 tablets) once daily for a weight of 135 pounds and over and take 80 mg (2 tablets) once daily for a weight of 134 and below.    Marland Kitchen glucose blood (ACCU-CHEK AVIVA PLUS) test strip 1 each by Other route as needed for other. Use as instructed to check blood sugar once daily.  Dx code:  E11.65 100 each 0  . LORazepam (ATIVAN) 0.5 MG tablet TAKE 1 TABLET BY MOUTH DAILY AT BEDTIME AS NEEDED FOR ANXIETY OR SLEEP MAY TAKE SECOND 1/2 TABLET AS NEEDED 45 tablet 0  . memantine (NAMENDA) 5 MG tablet Take 1 tablet (5 mg total) by mouth 2 (two) times daily. First 4 days take 1 tablet daily 60 tablet 3  . metFORMIN (GLUCOPHAGE-XR) 500 MG 24 hr tablet Take  2 tablets (1,000 mg total) by mouth daily with breakfast. 180 tablet 2  . morphine (MS CONTIN) 15 MG 12 hr tablet Take 1 tablet (15 mg total) by mouth in the morning. Per pain clinic    . morphine (MSIR) 15 MG tablet Take 15 mg by mouth every 6 (six) hours as needed for severe pain.    Marland Kitchen QUEtiapine (SEROQUEL) 25 MG tablet Take 2 tablets (50 mg total) by mouth at bedtime. (Patient taking differently: Take 25 mg by mouth at bedtime. ) 60 tablet 11  . candesartan (ATACAND) 16 MG tablet Take 8 mg by mouth daily.    . candesartan (ATACAND) 4 MG tablet Take 1 tablet (4 mg total) by mouth daily. 30 tablet 3  . Cholecalciferol (VITAMIN D) 2000 units CAPS Take 1 capsule (2,000 Units total) by mouth daily. 30 capsule   . morphine (MS CONTIN) 15 MG 12 hr tablet Take 1 tablet (15 mg total) by mouth See admin instructions. (Patient taking differently: Take 15 mg by mouth in the morning. ) 30 tablet 0   No facility-administered medications prior to visit.     Per HPI unless specifically indicated in ROS section below Review of Systems Objective:    BP 138/80 (BP Location: Right Arm, Patient Position: Sitting, Cuff Size: Normal)   Pulse 60   Temp (!) 97.5 F (36.4 C) (Temporal)   Ht 4' 10.5" (1.486 m)   Wt 129 lb 4 oz (58.6 kg)   SpO2 99%   BMI 26.55 kg/m   Wt Readings from Last 3 Encounters:  09/10/19 129 lb 4 oz (58.6 kg)  09/04/19 129 lb 8 oz (58.7 kg)  08/28/19 129 lb 8 oz (58.7 kg)    Physical Exam Vitals and nursing note reviewed.  Constitutional:      Appearance: Normal appearance. She is not Gutierrez-appearing.  Eyes:     Extraocular Movements: Extraocular movements intact.     Conjunctiva/sclera: Conjunctivae normal.     Pupils: Pupils are equal, round, and reactive to light.  Cardiovascular:     Rate and Rhythm: Normal rate and regular rhythm.     Pulses: Normal pulses.     Heart sounds: Normal heart sounds. No murmur.  Pulmonary:     Effort: Pulmonary effort is normal. No  respiratory distress.     Breath sounds: Normal breath sounds. No wheezing, rhonchi or rales.  Musculoskeletal:     Right lower leg: Edema (1+) present.     Left lower leg: Edema (1+) present.  Skin:    General: Skin is warm and dry.     Findings: No rash.  Neurological:     Mental Status: She is alert.  Psychiatric:  Mood and Affect: Mood normal.        Behavior: Behavior normal.       Results for orders placed or performed during the hospital encounter of 09/04/19  Glucose, capillary  Result Value Ref Range   Glucose-Capillary 116 (H) 70 - 99 mg/dL  Glucose, capillary  Result Value Ref Range   Glucose-Capillary 128 (H) 70 - 99 mg/dL   Assessment & Plan:  This visit occurred during the SARS-CoV-2 public health emergency.  Safety protocols were in place, including screening questions prior to the visit, additional usage of staff PPE, and extensive cleaning of exam room while observing appropriate contact time as indicated for disinfecting solutions.   Problem List Items Addressed This Visit    Sundowning    Doing well on nightly seroquel.      Recurrent boils    At least second episode - significantly improved - will treat for MRSA colonization with mupirocin ointment to nares x 5 days.       Relevant Medications   mupirocin ointment (BACTROBAN) 2 %   Lower leg DVT (deep venous thromboembolism), acute, left ([place])    Incidentally found - planned xarelto x 3 months to complete mid April 2021.       Relevant Medications   candesartan (ATACAND) 8 MG tablet   Liver mass    Benign focal subcapsular fat by MRCP      Essential hypertension (Chronic)    BP well controlled on current regimen - daughter verified at home they're taking candesartan 23m daily - refilled this dose to pharmacy.       Relevant Medications   candesartan (ATACAND) 8 MG tablet   Dementia, old age, with behavioral disturbance ([facility] - Primary    MRI didn't show evidence of old strokes, but did  show chronic microvascular ischemic changes as well as ischemic changes in basal gangila and L cerebellum. Started on seroquel by neurology with benefit. Appreciate neuro care. Progressive disease noted over the past 1 year, acutely worse after COVID illness 05/2019. Continue namenda 544mBID.       Controlled diabetes mellitus type 2 with complications ([place]   Well controlled on invokana and metformin XR. Reviewed monitoring for UTI, yeast infection and groin cellulitis/infection with invokana use.       Relevant Medications   candesartan (ATACAND) 8 MG tablet   Chronic pancreatitis ([place]   Planned GI f/u - appreciate GI care.       Relevant Medications   morphine (MS CONTIN) 15 MG 12 hr tablet   Chronic lower back pain    Doing overall better on MS Contin 1562mnce daily, with PRN tylenol then MSIR for breakthrough pain (rare use). Improved alertness noted by daughter with this change.       Relevant Medications   morphine (MS CONTIN) 15 MG 12 hr tablet       Meds ordered this encounter  Medications  . DISCONTD: mupirocin ointment (BACTROBAN) 2 %    Sig: Place 1 application into the nose 2 (two) times daily.    Dispense:  22 g    Refill:  0  . mupirocin ointment (BACTROBAN) 2 %    Sig: Place 1 application into the nose 2 (two) times daily. For 5 days, once a month for 6 months    Dispense:  22 g    Refill:  2  . candesartan (ATACAND) 8 MG tablet    Sig: Take 1 tablet (8 mg total) by mouth  daily.    Dispense:  90 tablet    Refill:  1   No orders of the defined types were placed in this encounter.   Patient Instructions  Continue current medicines You're doing well today Imaging overall reassuring Return as needed or in 3 months for follow up visit.   Follow up plan: Return in about 3 months (around 12/11/2019) for follow up visit.  Ria Bush, MD

## 2019-09-10 NOTE — Assessment & Plan Note (Deleted)
At least second episode - significantly improved - will treat for MRSA colonization with mupirocin ointment to nares x 5 days.

## 2019-09-10 NOTE — Patient Instructions (Addendum)
Continue current medicines You're doing well today Imaging overall reassuring Return as needed or in 3 months for follow up visit.

## 2019-09-11 ENCOUNTER — Encounter: Payer: Self-pay | Admitting: Family Medicine

## 2019-09-11 MED ORDER — CANDESARTAN CILEXETIL 8 MG PO TABS: 8.0000 mg | ORAL_TABLET | Freq: Every day | ORAL | 1 refills | Status: DC

## 2019-09-11 NOTE — Assessment & Plan Note (Addendum)
Well controlled on invokana and metformin XR. Reviewed monitoring for UTI, yeast infection and groin cellulitis/infection with invokana use.

## 2019-09-11 NOTE — Assessment & Plan Note (Signed)
Incidentally found - planned xarelto x 3 months to complete mid April 2021.

## 2019-09-11 NOTE — Assessment & Plan Note (Addendum)
At least second episode - significantly improved - will treat for MRSA colonization with mupirocin ointment to nares x 5 days.

## 2019-09-11 NOTE — Assessment & Plan Note (Signed)
BP well controlled on current regimen - daughter verified at home they're taking candesartan 8mg  daily - refilled this dose to pharmacy.

## 2019-09-11 NOTE — Assessment & Plan Note (Addendum)
MRI didn't show evidence of old strokes, but did show chronic microvascular ischemic changes as well as ischemic changes in basal gangila and L cerebellum. Started on seroquel by neurology with benefit. Appreciate neuro care. Progressive disease noted over the past 1 year, acutely worse after COVID illness 05/2019. Continue namenda 5mg  BID.

## 2019-09-11 NOTE — Assessment & Plan Note (Signed)
Doing overall better on MS Contin 15mg  once daily, with PRN tylenol then MSIR for breakthrough pain (rare use). Improved alertness noted by daughter with this change.

## 2019-09-11 NOTE — Assessment & Plan Note (Signed)
Benign focal subcapsular fat by MRCP

## 2019-09-11 NOTE — Assessment & Plan Note (Signed)
Planned GI f/u - appreciate GI care.

## 2019-09-11 NOTE — Assessment & Plan Note (Signed)
Doing well on nightly seroquel.

## 2019-09-14 ENCOUNTER — Telehealth: Payer: Self-pay | Admitting: Family Medicine

## 2019-09-14 NOTE — Chronic Care Management (AMB) (Signed)
°  Chronic Care Management   Note  09/14/2019 Name: Meredith Gutierrez MRN: [id number] DOB: May 05, 1941  Meredith Gutierrez is a 79 y.o. year old female who is a primary care patient of Ria Bush, MD. I reached out to [name] by phone today in response to a referral sent by Ms. Miciah A Allender's PCP, Ria Bush, MD.   Ms. Krichbaum was given information about Chronic Care Management services today including:  1. CCM service includes personalized support from designated clinical staff supervised by her physician, including individualized plan of care and coordination with other care providers 2. 24/7 contact phone numbers for assistance for urgent and routine care needs. 3. Service will only be billed when office clinical staff spend 20 minutes or more in a month to coordinate care. 4. Only one practitioner may furnish and bill the service in a calendar month. 5. The patient may stop CCM services at any time (effective at the end of the month) by phone call to the office staff.   Patient agreed to services and verbal consent obtained.   Follow up plan:   Raynicia Dukes [employer] Scheduler

## 2019-09-24 ENCOUNTER — Telehealth: Payer: Self-pay

## 2019-09-24 DIAGNOSIS — M47816 Spondylosis without myelopathy or radiculopathy, lumbar region: Secondary | ICD-10-CM | POA: Diagnosis not present

## 2019-09-24 NOTE — Telephone Encounter (Signed)
Agree with stopping bactroban.  This was being used to try empiric MRSA eradication.  Don't recommend any further treatment at this time.  May use neosporin as topical antibiotic ointment/cream as needed if tolerated well in the past.

## 2019-09-24 NOTE — Telephone Encounter (Signed)
[name] returned call. She reports pt has a rash on her buttocks and back about 3-4 inches across and a smaller one on her face and neck. [name] gave pt benadryl and this seems to have helped the itching. [name] had pt stop using the med yesterday. Denies difficulty breathing, swallowing or facial swelling. Advised a msg would be sent to Dr. Darnell Level and this office would f/u. Advised not to use med anymore and if any difficulty breathing, facial or tongue swelling, or difficulty swallowing to call 911. [name] verbalized understanding.

## 2019-09-24 NOTE — Telephone Encounter (Signed)
Left message for daughter, [name], on vm per dpr relaying Dr. Synthia Innocent message.

## 2019-09-24 NOTE — Telephone Encounter (Signed)
[name] - Client Nonclinical Telephone Record  [facility] Client [facility] Night - Client Client Site [place] Physician Ria Bush - MD Contact Type Call Who Is Calling Patient / Member / Family / Caregiver Caller Name [name] Caller Phone Number [phone] Patient Name Meredith Gutierrez Patient DOB 14-Oct-1940 Call Type Message Only Information Provided Reason for Call Request for General Office Information Initial Comment Caller states her mother was prescribed Bactroban. The pt has broken out in a rash. Is there a different medication that the pt could use? [name] March 22, 1941 [name] [phone] Additional Comment Caller states her mother is having a reaction to Bactroban. Disp. Time Disposition Final User 09/24/2019 7:26:22 AM General Information Provided Yes King-Hussey, Berdi Call Closed By: Bonnita Nasuti Transaction Date/Time: 09/24/2019 7:19:56 AM (ET)

## 2019-09-24 NOTE — Telephone Encounter (Signed)
LVM for [name].

## 2019-10-06 ENCOUNTER — Telehealth: Payer: Self-pay | Admitting: *Deleted

## 2019-10-06 NOTE — Telephone Encounter (Signed)
PA started for generic Seroquel on covermymeds (key: [id number]). Pt has coverage with [employer] [phone]). [id number]. PA approved through 06/13/20. [id number].

## 2019-10-08 ENCOUNTER — Telehealth: Payer: Medicare HMO

## 2019-10-11 ENCOUNTER — Ambulatory Visit: Payer: Medicare HMO | Admitting: Gastroenterology

## 2019-10-11 ENCOUNTER — Other Ambulatory Visit: Payer: Self-pay

## 2019-10-11 ENCOUNTER — Encounter: Payer: Self-pay | Admitting: Gastroenterology

## 2019-10-11 VITALS — BP 136/80 | HR 92 | Temp 98.9°F | Ht 58.75 in | Wt 130.1 lb

## 2019-10-11 DIAGNOSIS — K861 Other chronic pancreatitis: Secondary | ICD-10-CM

## 2019-10-11 DIAGNOSIS — R1310 Dysphagia, unspecified: Secondary | ICD-10-CM | POA: Diagnosis not present

## 2019-10-11 DIAGNOSIS — K862 Cyst of pancreas: Secondary | ICD-10-CM | POA: Diagnosis not present

## 2019-10-11 NOTE — Progress Notes (Signed)
HPI :  79 year old female here for a follow-up visit for pancreatic cyst / abnormal pancreatic imaging, accompanied by her daughter who provides most of the patient's history.  Patient has a history of dementia, chronic tobacco use, COPD, chronic diastolic heart failure. She was hospitalized in July 2020 for sepsis related to pneumonia.  She underwent imaging which showed a questionable liver mass and abnormal pancreatic findings.  She saw [place] as an outpatient in clinic after that hospitalization a MRCP/MRI of the abdomen was performed in September 2020, although she required anesthesia to sedate her for this as she had a hard time lying still for it.  The MRI in September 2020 showed no liver mass, she had focal subcapsular fat infiltration.  She did have evidence of chronic pancreatitis with scattered cystic pancreatic lesions.  Radiology had recommended a follow-up MRI in 6 months.  She underwent a follow-up MRCP September 04, 2019 as outlined:  MRCP 09/04/19 - IMPRESSION: 1. Chronic pancreatitis. Numerous nonaggressive nonenhancing cystic pancreatic lesions, largest 1.6 cm in the pancreatic head, all stable since 02/22/2019 MRI, likely pancreatic pseudocysts. Follow-up MRI abdomen without and with IV contrast recommended in 2 years. This recommendation follows ACR consensus guidelines: Management of Incidental Pancreatic Cysts: A White Paper of the ACR Incidental Findings Committee. [place] [id number]. 2. Stable left adrenal adenoma. 3.  Aortic Atherosclerosis (ICD10-I70.0).   Patient daughter reports that she has been feeling well.  She denies any abdominal pains.  She eats well.  No weight loss.  No changes in her bowels.  She is on chronic morphine for back pain.  She has had some memory issues and is on Namenda, daughter states no better but she is not progressed much.  The patient does have an extensive tobacco use history but stopped about 4 years ago.  They deny any  significant alcohol use.  The patient has no personal history of pancreatitis.  Her aunt had pancreatic cancer and her sister had pancreatitis.  I inquired about the patient's dysphagia.  Daughter states that this is mild and stable. She has had a barium swallow in April 2019 which showed esophageal dysmotility without any obvious stenosis or stricture.  I performed an EGD in November 2019. She was noted to have mild esophagitis, a small nodule in the distal esophagus, benign schwan cell hemartoma, she was empirically dilated to 18 mm.  She had a very good response to dilation at the time with resolution of symptoms, however over time mild dysphagia has recurred.  The daughter and patient had wanted to avoid future EGD unless symptoms severe.  Otherwise the patient has never had a colonoscopy and never wants one.  She has declined optical colonoscopy.  She had a negative Cologuard November 2018.   Past Medical History:  Diagnosis Date  . Angina    never needed to take nitroglycerin.  . Anxiety   . Blood transfusion without reported diagnosis   . Cerebral atherosclerosis   . Community acquired pneumonia of left lower lobe of lung 12/20/2018  . COPD (chronic obstructive pulmonary disease) ([place])    02-16-13-"pt. denies this"-shows on CXR of 2- 2013.  Marland Kitchen Coronary artery disease 2003   s/p CABG 2014  . COVID-19 virus infection 06/2019   Covid-19 PNA  . Dementia ([place])   . Diabetes mellitus without complication ([place]) [id number]   NEW ONSET  . Diastolic CHF, on admit treated. 08/10/2011  . Family history of adverse reaction to anesthesia    Dad and  Sister had trouble waking up  . Fibromuscular dysplasia of renal artery ([facility])    right  . Heart murmur   . History of kidney stones 02-16-13   "hx. of overgrowth of muscle-causing some stricture"renal artery stenosis-being followed [place]  . Hyperlipidemia   . Hypertension   . Hyperthyroidism    [place]  . Hyponatremia 04/2018  . Melanoma ([place]) 2010    skin cancers, 1 melanoma removed back  . Osteoarthritis   . Pneumonia due to COVID-19 virus 06/26/2019     Past Surgical History:  Procedure Laterality Date  . ABDOMINAL HYSTERECTOMY  1970   heavy bleeding  . APPENDECTOMY  1960s  . CARDIAC CATHETERIZATION  08/09/2011   no sign. coronary obstruction. LIMA is atretic w/excellent flow down the native LAD  . CARDIAC CATHETERIZATION  08/22/2001   no sign. coronary stenosis,  . CATARACT EXTRACTION, BILATERAL Bilateral   . CESAREAN SECTION    . CORONARY ARTERY BYPASS GRAFT  02-16-13   3'03-no bypasses -Had 2 aneurysm repairs(stents used)  . ESOPHAGOGASTRODUODENOSCOPY  04/2018   LA Grade A esophagitis, small nodule distal esophagus (benign schwann cell hamartoma), empiric dilation to 85m ([place]ack[place] Delgadillo)  . LAPAROTOMY N/A 02/20/2013   Procedure: EXPLORATORY LAPAROTOMY;  WJanie Morning MD  . LEFT HEART CATHETERIZATION WITH CORONARY ANGIOGRAM N/A 08/09/2011   Procedure: LEFT HEART CATHETERIZATION WITH CORONARY ANGIOGRAM;  Surgeon: MSanda Klein MD;  [facility];  Service: Cardiovascular;  Laterality: N/A;  . NM MYOCAR PERF WALL MOTION  07/20/2010   normal  . RADIOLOGY WITH ANESTHESIA N/A 02/22/2019   Procedure: MRI OF ABDOMEN WITH AND WITHOUT CONTRAST;  Surgeon: Radiologist, Medication, MD;  [facility]  Service: Radiology;  Laterality: N/A;  . RADIOLOGY WITH ANESTHESIA Bilateral 09/04/2019   Procedure: MRI WITH ANESTHESIA OF ABDOMEN WITH AND WITHOUT CONTRAST, BRAIN WITHOUT CONTRAST;  Surgeon: Radiologist, Medication, MD;  [facility]  Service: Radiology;  Laterality: Bilateral;  . SALPINGOOPHORECTOMY Bilateral 02/20/2013   benign seromucinous cystadenofibroma R ovary ([name] MD)  . TOOTH EXTRACTION    . TOTAL HIP ARTHROPLASTY Left 2015   Family History  Problem Relation Age of Onset  . Stroke Mother   . Hypotension Mother   . Dementia Mother 619 . Coronary artery disease Father   . Hypertension Father   . Breast  cancer Maternal Aunt   . Hypotension Sister   . Hypertension Brother   . Diabetes Brother   . Lung cancer Daughter   . Thyroid cancer Daughter   . Colon polyps Maternal Grandfather   . Colon cancer Neg Hx   . Esophageal cancer Neg Hx   . Stomach cancer Neg Hx   . Rectal cancer Neg Hx    Social History   Tobacco Use  . Smoking status: Former Smoker    Packs/day: 0.10    Years: 20.00    Pack years: 2.00    Types: Cigarettes    Quit date: 09/14/2017    Years since quitting: 2.0  . Smokeless tobacco: Never Used  . Tobacco comment: smoking cessation info given  Substance Use Topics  . Alcohol use: Yes    Comment: occasional  . Drug use: No   Current Outpatient Medications  Medication Sig Dispense Refill  . ACCU-CHEK FASTCLIX LANCETS MISC 1 each by Does not apply route daily. Use as directed to check blood sugar once daily.  Dx code:  E11.65 100 each 0  . albuterol (PROVENTIL) (2.5 MG/3ML) 0.083% nebulizer solution Take 3 mLs (2.5  mg total) by nebulization every 6 (six) hours as needed for wheezing or shortness of breath. 75 mL 1  . Alcohol Swabs (B-D SINGLE USE SWABS REGULAR) PADS 1 each by Does not apply route daily. Use as directed to check blood sugar once daily.  Dx code:  E11.65 100 each 0  . ascorbic acid (VITAMIN C) 500 MG tablet Take 500 mg by mouth daily.    Marland Kitchen aspirin EC 81 MG tablet Take 81 mg by mouth daily.    . Blood Glucose Monitoring Suppl (ACCU-CHEK AVIVA PLUS) w/Device KIT 1 each by Does not apply route daily. Use as directed to check blood sugar once daily.  Dx code:  E11.65 1 kit 0  . canagliflozin (INVOKANA) 100 MG TABS tablet Take 1 tablet (100 mg total) by mouth daily before breakfast. 90 tablet 2  . candesartan (ATACAND) 8 MG tablet Take 1 tablet (8 mg total) by mouth daily. 90 tablet 1  . cholecalciferol (VITAMIN D3) 25 MCG (1000 UNIT) tablet Take 1,000 Units by mouth daily.    . Cyanocobalamin (B-12) 1000 MCG SUBL Place 1 tablet under the tongue daily.    .  furosemide (LASIX) 40 MG tablet Take 1 tablet (40 mg total) by mouth daily as needed. Take 120 mg (3 tablets) once daily for a weight of 135 pounds and over and take 80 mg (2 tablets) once daily for a weight of 134 and below.    Marland Kitchen glucose blood (ACCU-CHEK AVIVA PLUS) test strip 1 each by Other route as needed for other. Use as instructed to check blood sugar once daily.  Dx code:  E11.65 100 each 0  . LORazepam (ATIVAN) 0.5 MG tablet TAKE 1 TABLET BY MOUTH DAILY AT BEDTIME AS NEEDED FOR ANXIETY OR SLEEP MAY TAKE SECOND 1/2 TABLET AS NEEDED 45 tablet 0  . memantine (NAMENDA) 5 MG tablet Take 1 tablet (5 mg total) by mouth 2 (two) times daily. First 4 days take 1 tablet daily 60 tablet 3  . metFORMIN (GLUCOPHAGE-XR) 500 MG 24 hr tablet Take 2 tablets (1,000 mg total) by mouth daily with breakfast. 180 tablet 2  . morphine (MS CONTIN) 15 MG 12 hr tablet Take 1 tablet (15 mg total) by mouth in the morning. Per pain clinic    . morphine (MSIR) 15 MG tablet Take 15 mg by mouth every 6 (six) hours as needed for severe pain.    Marland Kitchen QUEtiapine (SEROQUEL) 25 MG tablet Take 2 tablets (50 mg total) by mouth at bedtime. (Patient taking differently: Take 25 mg by mouth at bedtime. ) 60 tablet 11   No current facility-administered medications for this visit.   Allergies  Allergen Reactions  . Codeine Anaphylaxis and Shortness Of Breath  . Prednisolone Acetate Shortness Of Breath and Nausea Only  . Prednisone Hives, Shortness Of Breath and Swelling  . Nitroglycerin Other (See Comments)    Reaction not recalled   . Metolazone Other (See Comments)    Hypokalemia, hyponatremia  . Adhesive [Tape] Rash and Other (See Comments)    SKIN IS VERY SENSITIVE- TEARS AND BRUISES EASILY, also  . Crestor [Rosuvastatin Calcium] Other (See Comments)    Weakness with statins  . Lopressor [Metoprolol Tartrate] Other (See Comments)    Bradycardia   . Mupirocin Rash  . Sulfamethoxazole Hives and Swelling  . Zetia [Ezetimibe]  Other (See Comments)    Weakness      Review of Systems: All systems reviewed and negative except where noted in HPI.  Lab Results  Component Value Date   WBC 6.7 06/27/2019   HGB 12.7 06/27/2019   HCT 38.3 06/27/2019   MCV 86.1 06/27/2019   PLT 311 06/27/2019    Lab Results  Component Value Date   CREATININE 0.68 08/10/2019   BUN 18 08/10/2019   NA 138 08/10/2019   K 5.0 08/10/2019   CL 104 08/10/2019   CO2 29 08/10/2019    Lab Results  Component Value Date   ALT 10 08/10/2019   AST 11 08/10/2019   ALKPHOS 68 08/10/2019   BILITOT 0.6 08/10/2019     Physical Exam: BP 136/80 (BP Location: Left Arm, Patient Position: Sitting, Cuff Size: Normal)   Pulse 92   Temp 98.9 F (37.2 C)   Ht 4' 10.75" (1.492 m) Comment: height measured without shoes  Wt 130 lb 2 oz (59 kg)   BMI 26.51 kg/m  Constitutional: Pleasant , female in no acute distress.  Cardiovascular: Normal rate, regular rhythm.  Pulmonary/chest: Effort normal and breath sounds normal. No wheezing, rales or rhonchi. Abdominal: Soft, nondistended, nontender.  There are no masses palpable. . Skin: Skin is warm and dry. No rashes noted. Psychiatric: Normal mood and affect. Behavior is normal.   ASSESSMENT AND PLAN: 79 year old female here for reassessment the following issues:  Chronic pancreatitis / pancreatic cysts - patient is asymptomatic and this was noted incidentally on prior imaging which led to these exams.  MRCP shows changes of chronic calcific pancreatitis.  Her chronic tobacco use puts her at risk for this however she has never had prior clinical pancreatitis we are aware of.  MRCP shows stable cystic changes of the pancreas which are benign-appearing.  Per 2015 AGA guidelines I do not see any high risk characteristics that would warrant EUS at this time.  I discussed with the patient's daughter how aggressive she wanted to be with further surveillance moving forward.  If the patient did develop  changes concerning for pancreatic cancer, in light of her medical problems and age, not sure if they would want to pursue any treatment.  We discussed this for a bit.  I do not see that she warrants EUS at this time.  I would like to see her in follow-up in the office in 1 year for reassessment and I can engage the daughter again in regards to if and when we should pursue any further surveillance imaging, we may elect no further imaging but will await her course and daughter will think about it.  They agree with the plan.  She is otherwise asymptomatic, if that changes they will contact me.  Dysphagia - mild dysphagia.  She had significant benefit from empiric dilation previously however symptoms slowly recurred.  She has some mild persistent dysphagia but no concerning findings on her last endoscopy and barium study.  They want to hold off on any intervention at this time unless symptoms become severe or worsen which is reasonable.  [place] Cellar, MD [facility]

## 2019-11-04 IMAGING — MR MR MRCP
5 of 8 series · 21 of 48 positions shown · IV contrast (agent unspecified)
Comparison: 12/20/2018 CT abdomen/pelvis.

CLINICAL DATA: Indeterminate left adrenal nodule. History of
melanoma.

EXAM:
MRI ABDOMEN WITHOUT CONTRAST  (INCLUDING MRCP)
TECHNIQUE: Multiplanar multisequence MR imaging of the abdomen was performed
without IV contrast. Due to technical failure of the MRI scanner,
MRCP and post contrast MR sequences could not be obtained.

[Series 7: cor ssfse nav · coronal · 6.0mm · 0.74mm/px · 4 of 39 slices shown]
[im 1/39]
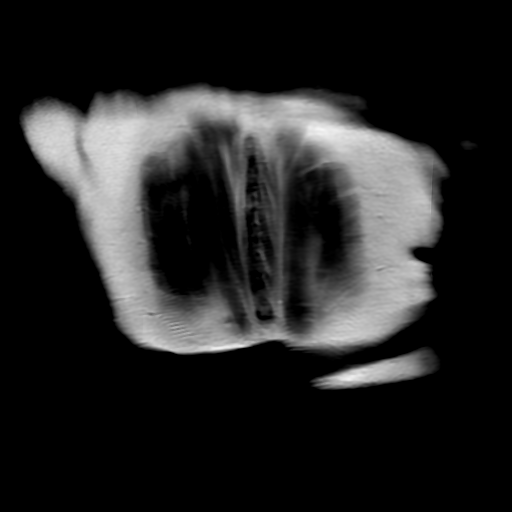
[im 13/39]
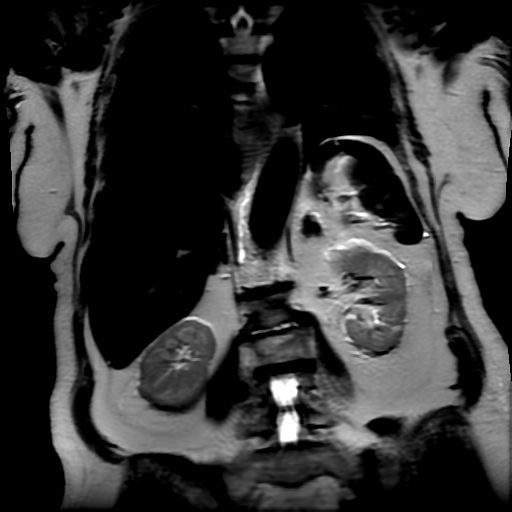
[im 26/39]
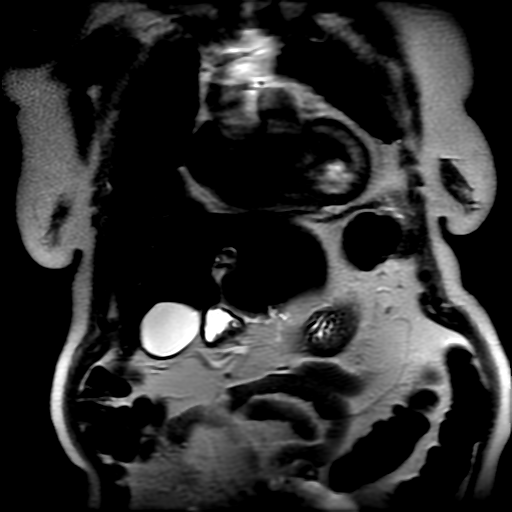
[im 39/39]
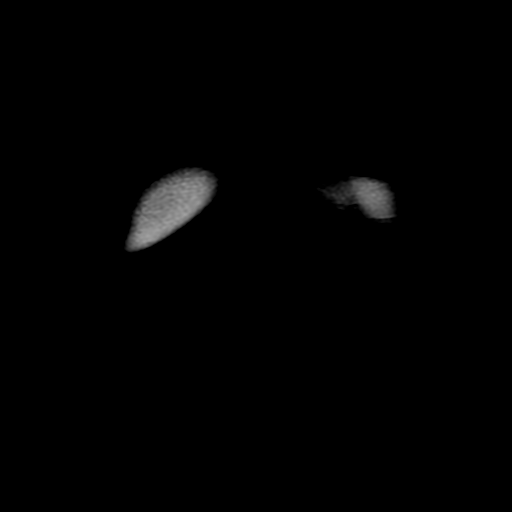

[Series 8: ax ssfse nav · axial · 6.0mm · 0.74mm/px · z∈[+66,+354]mm · 4 of 49 slices shown]
[im 1/49]
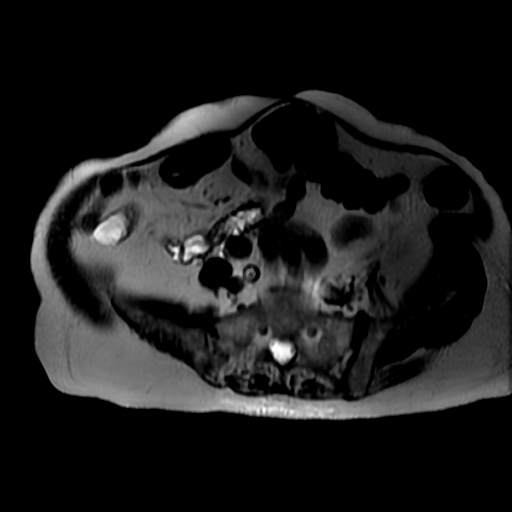
[im 17/49]
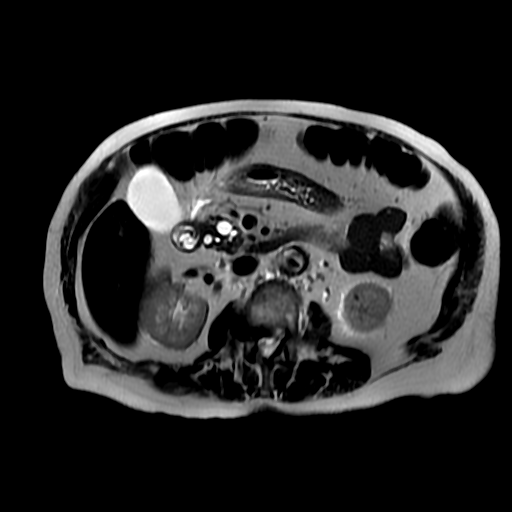
[im 33/49]
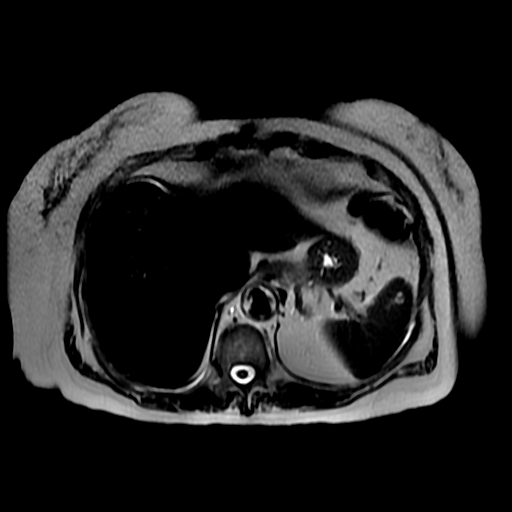
[im 49/49]
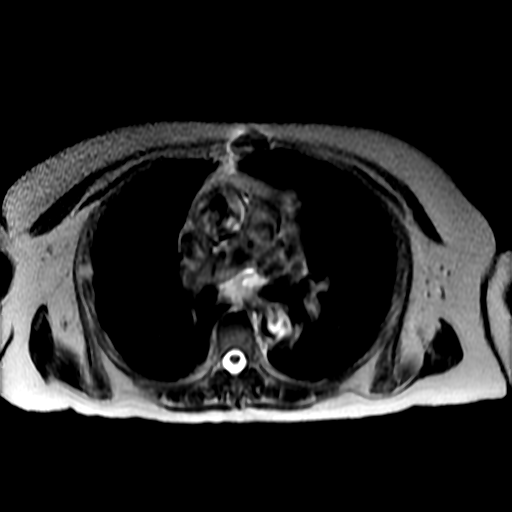

[Series 9: T2 fat-sat · axial · 6.0mm · 0.74mm/px · z∈[+53,+341]mm · 4 of 49 slices shown]
[im 1/49]
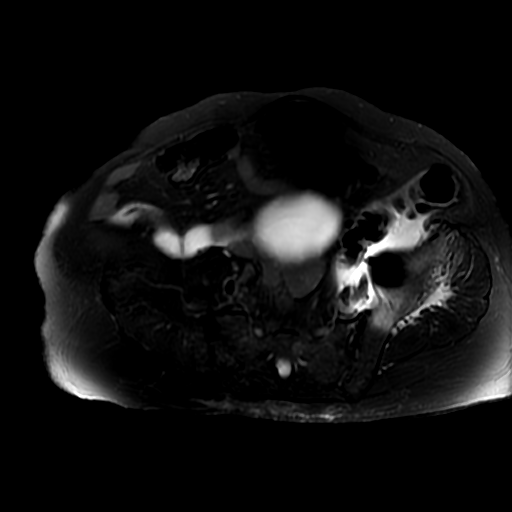
[im 17/49]
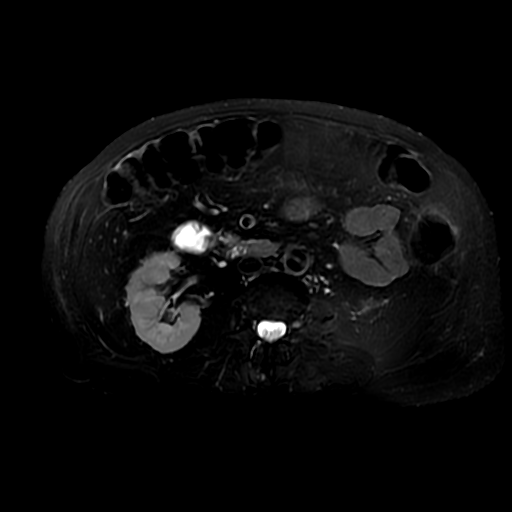
[im 33/49]
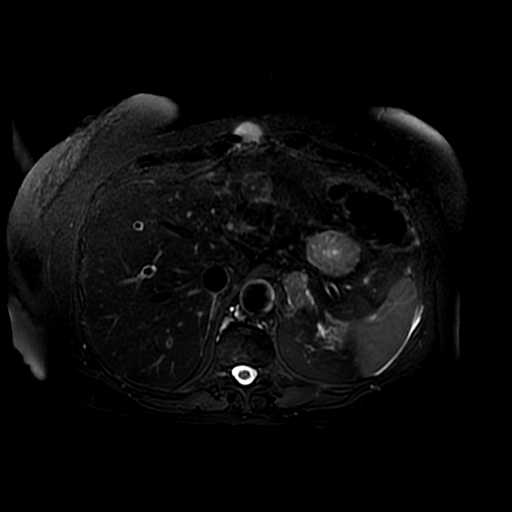
[im 49/49]
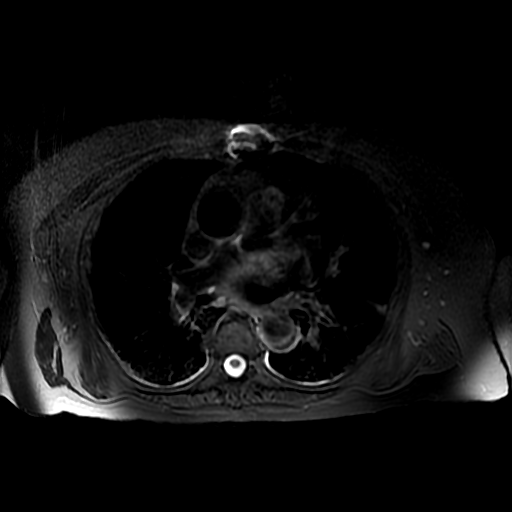

[Series 10: DWI b500 · axial · 8.0mm · 1.48mm/px · z∈[+67,+377]mm · 6 of 64 slices shown]
[im 1/64]
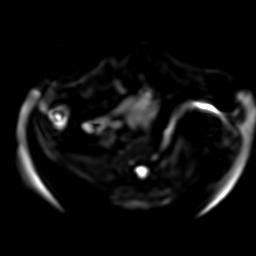
[im 13/64]
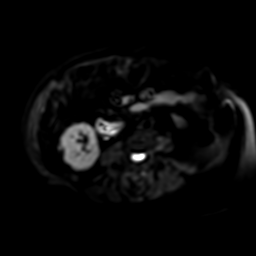
[im 26/64]
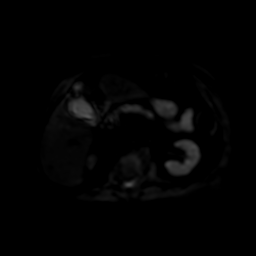
[im 38/64]
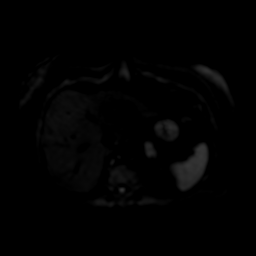
[im 51/64]
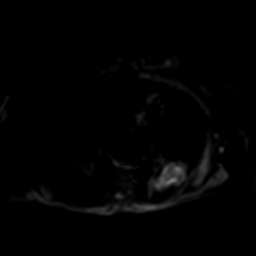
[im 64/64]
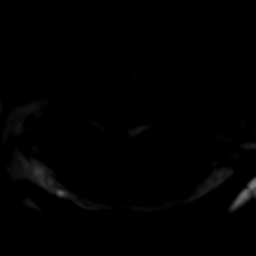

[Series 11: T1 dynamic · axial · 5.0mm · 0.74mm/px · z∈[+87,+150]mm · 3 of 104 slices shown]
[im 1/104]
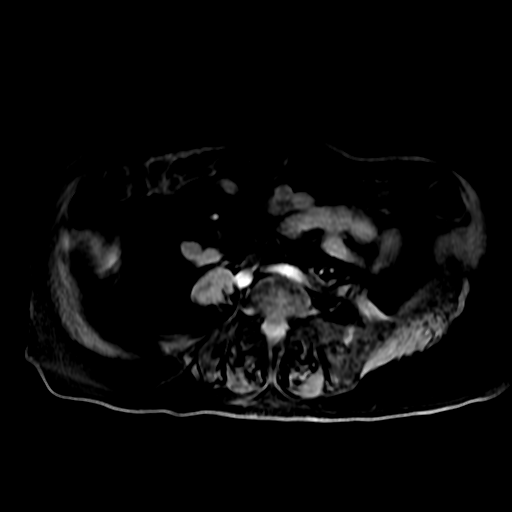
[im 13/104]
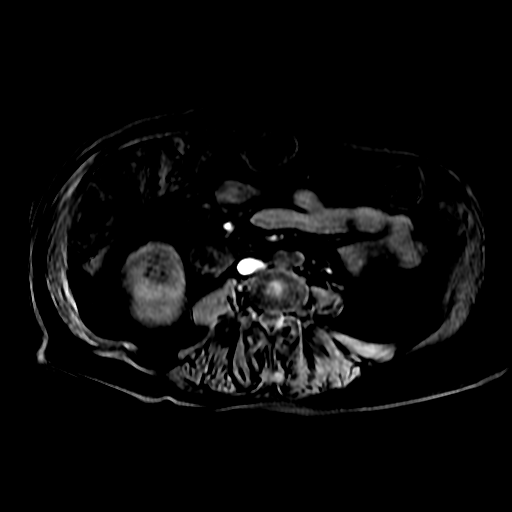
[im 26/104]
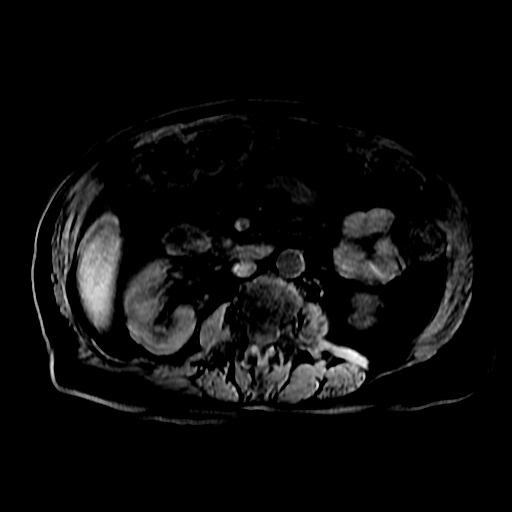

[21 of 48 positions shown; findings below may reference images not displayed]

FINDINGS: Lower chest: No acute abnormality at the lung bases. Left lung base
parenchymal band, compatible with mild scarring or atelectasis.

Hepatobiliary: Normal liver size and configuration. Mild diffuse
hepatic steatosis. There is focal subcapsular liver fat adjacent to
the porta hepatis in the segment 4A left liver lobe measuring 2.6 x
2.4 cm (series 9944/image 51). There are a few tiny T2 hyperintense
lesions scattered throughout the liver, largest 5 mm in the left
lower lobe (series 8/image 20), incompletely characterized on this
noncontrast MR study. Normal gallbladder with no cholelithiasis. No
biliary ductal dilatation. Common bile duct diameter 6 mm,
top-normal. No evidence of choledocholithiasis. No biliary
strictures or beading.

Pancreas: There is diffuse irregular pancreatic duct dilation up to
5 mm diameter. There is generalized pancreatic parenchymal atrophy.
Pancreatic parenchymal T1 signal intensity is low. These findings
are compatible with chronic pancreatitis given scattered coarse
pancreatic parenchymal calcifications on the 12/20/2018 CT study. No
pancreas divisum. There a few scattered small T2 hyperintense
lesions scattered throughout the pancreas, largest 1.1 cm in the
pancreatic neck (series 8/image 28), not appreciably changed from
recent CT, with no evidence of wall thickening or thickened internal
septations.

Spleen: Normal size spleen. A few scattered tiny T2 hyperintense
splenic lesions, largest 0.9 cm (series 8/image 17), not appreciably
changed from 12/20/2018 CT.

Adrenals/Urinary Tract: There is a 2.5 x 1.6 cm left adrenal nodule
(series 8/image 20) demonstrating prominent loss of signal intensity
on out of phase chemical shift imaging, diagnostic of a benign fat
containing adenoma. No right adrenal nodules. No hydronephrosis. A
few scattered subcentimeter T2 hyperintense renal cortical lesions
in both kidneys, largest 0.7 cm in upper left kidney (series 9/image
21). There is a tiny 0.4 cm T2 hypointense and T1 hyperintense renal
cortical lesion in the lower left kidney (series 9/image 31).

Stomach/Bowel: Normal non-distended stomach. Visualized small and
large bowel is normal caliber, with no bowel wall thickening.

Vascular/Lymphatic: Nonaneurysmal abdominal aorta. No pathologically
enlarged lymph nodes in the abdomen.

Other: No abdominal ascites or focal fluid collection.

Musculoskeletal: No aggressive appearing focal osseous lesions.
IMPRESSION: 1. The MRI examination was performed without IV contrast due to
technical failure of the MRI scanner during the scan.
2. The left adrenal 2.5 cm nodule is definitively characterized as a
benign adenoma by noncontrast MRI.
3. Mild diffuse hepatic steatosis. The subcapsular lesion described
in the segment 4A left liver lobe on the 12/20/2018 CT study is
definitively characterized as benign focal subcapsular fat by
noncontrast MRI.
4. Evidence of chronic pancreatitis with diffuse irregular beaded
dilatation of the pancreatic duct. Scattered small T2 hyperintense
pancreatic lesions, largest 1.1 cm in the pancreatic neck, without
aggressive features by noncontrast MRI, although incompletely
characterized without IV contrast, most likely small pancreatic
pseudocysts. Follow-up MRI abdomen without and with IV contrast
recommended in 6-12 months.
5. Tiny hemorrhagic/proteinaceous 0.4 cm renal cortical lesion in
the lower left kidney. Additional subcentimeter T2 hyperintense
liver, splenic and renal cortical lesions. These lesions are all
incompletely characterized on this noncontrast study, however are
statistically likely benign and can also be reassessed on follow-up
MRI in 6-12 months.
6. No biliary ductal dilatation. CBD diameter 6 mm, top-normal. No
evidence of cholelithiasis or choledocholithiasis.

## 2019-11-28 NOTE — Telephone Encounter (Signed)
FYI

## 2019-12-01 ENCOUNTER — Encounter (HOSPITAL_COMMUNITY): Payer: Self-pay | Admitting: Emergency Medicine

## 2019-12-01 ENCOUNTER — Emergency Department (HOSPITAL_COMMUNITY)
Admission: EM | Admit: 2019-12-01 | Discharge: 2019-12-01 | Disposition: A | Discharge status: Home / Self Care | Payer: Medicare HMO | Attending: Emergency Medicine | Admitting: Emergency Medicine

## 2019-12-01 ENCOUNTER — Other Ambulatory Visit: Payer: Self-pay

## 2019-12-01 DIAGNOSIS — Z7982 Long term (current) use of aspirin: Secondary | ICD-10-CM | POA: Diagnosis not present

## 2019-12-01 DIAGNOSIS — E1165 Type 2 diabetes mellitus with hyperglycemia: Secondary | ICD-10-CM | POA: Insufficient documentation

## 2019-12-01 DIAGNOSIS — J449 Chronic obstructive pulmonary disease, unspecified: Secondary | ICD-10-CM | POA: Insufficient documentation

## 2019-12-01 DIAGNOSIS — Z87891 Personal history of nicotine dependence: Secondary | ICD-10-CM | POA: Diagnosis not present

## 2019-12-01 DIAGNOSIS — Z8616 Personal history of COVID-19: Secondary | ICD-10-CM | POA: Diagnosis not present

## 2019-12-01 DIAGNOSIS — Z86718 Personal history of other venous thrombosis and embolism: Secondary | ICD-10-CM | POA: Diagnosis not present

## 2019-12-01 DIAGNOSIS — F0390 Unspecified dementia without behavioral disturbance: Secondary | ICD-10-CM | POA: Diagnosis not present

## 2019-12-01 DIAGNOSIS — Z951 Presence of aortocoronary bypass graft: Secondary | ICD-10-CM | POA: Insufficient documentation

## 2019-12-01 DIAGNOSIS — E079 Disorder of thyroid, unspecified: Secondary | ICD-10-CM | POA: Insufficient documentation

## 2019-12-01 DIAGNOSIS — I110 Hypertensive heart disease with heart failure: Secondary | ICD-10-CM | POA: Diagnosis not present

## 2019-12-01 DIAGNOSIS — R739 Hyperglycemia, unspecified: Secondary | ICD-10-CM

## 2019-12-01 DIAGNOSIS — I5032 Chronic diastolic (congestive) heart failure: Secondary | ICD-10-CM | POA: Insufficient documentation

## 2019-12-01 DIAGNOSIS — Z7984 Long term (current) use of oral hypoglycemic drugs: Secondary | ICD-10-CM | POA: Diagnosis not present

## 2019-12-01 DIAGNOSIS — Z79899 Other long term (current) drug therapy: Secondary | ICD-10-CM | POA: Diagnosis not present

## 2019-12-01 DIAGNOSIS — I2510 Atherosclerotic heart disease of native coronary artery without angina pectoris: Secondary | ICD-10-CM | POA: Diagnosis not present

## 2019-12-01 LAB — CBC WITH DIFFERENTIAL/PLATELET
Abs Immature Granulocytes: 0.06 10*3/uL (ref 0.00–0.07)
Basophils Absolute: 0.1 10*3/uL (ref 0.0–0.1)
Basophils Relative: 1 %
Eosinophils Absolute: 0.1 10*3/uL (ref 0.0–0.5)
Eosinophils Relative: 1 %
HCT: 42.4 % (ref 36.0–46.0)
Hemoglobin: 14 g/dL (ref 12.0–15.0)
Immature Granulocytes: 1 %
Lymphocytes Relative: 20 %
Lymphs Abs: 1.9 10*3/uL (ref 0.7–4.0)
MCH: 30.3 pg (ref 26.0–34.0)
MCHC: 33 g/dL (ref 30.0–36.0)
MCV: 91.8 fL (ref 80.0–100.0)
Monocytes Absolute: 0.6 10*3/uL (ref 0.1–1.0)
Monocytes Relative: 6 %
Neutro Abs: 6.9 10*3/uL (ref 1.7–7.7)
Neutrophils Relative %: 71 %
Platelets: 286 10*3/uL (ref 150–400)
RBC: 4.62 MIL/uL (ref 3.87–5.11)
RDW: 13.8 % (ref 11.5–15.5)
WBC: 9.6 10*3/uL (ref 4.0–10.5)
nRBC: 0 % (ref 0.0–0.2)

## 2019-12-01 LAB — URINALYSIS, ROUTINE W REFLEX MICROSCOPIC
Bacteria, UA: NONE SEEN
Bilirubin Urine: NEGATIVE
Glucose, UA: >=500 mg/dL — AB
Hgb urine dipstick: NEGATIVE
Ketones, ur: NEGATIVE mg/dL
Leukocytes,Ua: NEGATIVE
Nitrite: NEGATIVE
Protein, ur: NEGATIVE mg/dL
Specific Gravity, Urine: 1.022 (ref 1.005–1.030)
pH: 5 (ref 5.0–8.0)

## 2019-12-01 LAB — COMPREHENSIVE METABOLIC PANEL
ALT: 17 U/L (ref 0–44)
AST: 19 U/L (ref 15–41)
Albumin: 3.7 g/dL (ref 3.5–5.0)
Alkaline Phosphatase: 73 U/L (ref 38–126)
Anion gap: 11 (ref 5–15)
BUN: 20 mg/dL (ref 8–23)
CO2: 21 mmol/L — ABNORMAL LOW (ref 22–32)
Calcium: 9.3 mg/dL (ref 8.9–10.3)
Chloride: 101 mmol/L (ref 98–111)
Creatinine, Ser: 0.84 mg/dL (ref 0.44–1.00)
GFR calc Af Amer: >60 mL/min (ref >60)
GFR calc non Af Amer: >60 mL/min (ref >60)
Glucose, Bld: 310 mg/dL — ABNORMAL HIGH (ref 70–99)
Potassium: 4.5 mmol/L (ref 3.5–5.1)
Sodium: 133 mmol/L — ABNORMAL LOW (ref 135–145)
Total Bilirubin: 0.7 mg/dL (ref 0.3–1.2)
Total Protein: 6.6 g/dL (ref 6.5–8.1)

## 2019-12-01 LAB — I-STAT VENOUS BLOOD GAS, ED
Acid-base deficit: 1 mmol/L (ref 0.0–2.0)
Bicarbonate: 24.3 mmol/L (ref 20.0–28.0)
Calcium, Ion: 1.18 mmol/L (ref 1.15–1.40)
HCT: 42 % (ref 36.0–46.0)
Hemoglobin: 14.3 g/dL (ref 12.0–15.0)
O2 Saturation: 68 %
Potassium: 4.5 mmol/L (ref 3.5–5.1)
Sodium: 136 mmol/L (ref 135–145)
TCO2: 26 mmol/L (ref 22–32)
pCO2, Ven: 43.5 mmHg — ABNORMAL LOW (ref 44.0–60.0)
pH, Ven: 7.355 (ref 7.250–7.430)
pO2, Ven: 37 mmHg (ref 32.0–45.0)

## 2019-12-01 LAB — CBG MONITORING, ED: Glucose-Capillary: 287 mg/dL — ABNORMAL HIGH (ref 70–99)

## 2019-12-01 MED ORDER — CANAGLIFLOZIN 100 MG PO TABS: 200.0000 mg | ORAL_TABLET | Freq: Every day | ORAL | 0 refills | Status: DC

## 2019-12-01 NOTE — ED Provider Notes (Signed)
[facility] EMERGENCY DEPARTMENT Provider Note   CSN: [id number] Arrival date & time: 12/01/19  0753     History Chief Complaint  Patient presents with  . Hyperglycemia    Meredith Gutierrez is a 79 y.o. female.  79 year old female with extensive past medical history below including dementia, T2DM, dCHF who p/w hyperglycemia.  Daughter reports that the patient has had fluctuating blood sugars over the past several days which have been higher despite being compliant with medications.  Daughter reports trying to get her to drink more fluids without improvement.  Blood glucose peaked at 425 last night. She has had occasional dizziness and flushing. It is difficult to control her diet because of her dementia. No vomiting, diarrhea, URI symptoms, fevers, or recent illness. No recent medication changes.   The history is provided by a relative. The history is limited by the condition of the patient.  Hyperglycemia      Past Medical History:  Diagnosis Date  . Angina    never needed to take nitroglycerin.  . Anxiety   . Blood transfusion without reported diagnosis   . Cerebral atherosclerosis   . Community acquired pneumonia of left lower lobe of lung 12/20/2018  . COPD (chronic obstructive pulmonary disease) ([place])    02-16-13-"pt. denies this"-shows on CXR of 2- 2013.  Marland Kitchen Coronary artery disease 2003   s/p CABG 2014  . COVID-19 virus infection 06/2019   Covid-19 PNA  . Dementia ([place])   . Diabetes mellitus without complication ([place]) 60/1093   NEW ONSET  . Diastolic CHF, on admit treated. 08/10/2011  . Family history of adverse reaction to anesthesia    Dad and Sister had trouble waking up  . Fibromuscular dysplasia of renal artery (H[facility])    right  . Heart murmur   . History of kidney stones 02-16-13   "hx. of overgrowth of muscle-causing some stricture"renal artery stenosis-being followed [facility]  . Hyperlipidemia   . Hypertension   . Hyperthyroidism    [place]  .  Hyponatremia 04/2018  . Melanoma ([place]) 2010   skin cancers, 1 melanoma removed back  . Osteoarthritis   . Pneumonia due to COVID-19 virus 06/26/2019    Patient Active Problem List   Diagnosis Date Noted  . Low serum vitamin B12 08/14/2019  . Urinary incontinence 08/14/2019  . Protein-calorie malnutrition ([place]) 07/07/2019  . Lower leg DVT (deep venous thromboembolism), acute, left ([place]) 07/07/2019  . Pneumonia due to COVID-19 virus 06/26/2019  . Chronic pancreatitis ([place]) 01/01/2019  . Bacteremia due to group B Streptococcus 12/21/2018  . H/O total hip arthroplasty, left 12/21/2018  . Liver mass 12/20/2018  . Adrenal adenoma, left 12/20/2018  . Coronary artery disease involving coronary bypass graft of native heart without angina pectoris 08/19/2018  . Recurrent boils 08/17/2018  . Acute pain of left shoulder 08/17/2018  . Hamartoma ([place]) 08/02/2018  . Atherosclerosis of aorta ([place]) 06/29/2018  . Controlled diabetes mellitus type 2 with complications ([place]) 23/55/7322  . Systolic murmur 02/54/2706  . COPD (chronic obstructive pulmonary disease) ([place]) 04/20/2018  . Medicare annual wellness visit, subsequent 04/07/2018  . Chronic nasal congestion 04/03/2018  . Multiple atypical nevi 02/08/2018  . Genital warts 02/08/2018  . Sundowning 02/08/2018  . Pulmonary nodules 12/13/2017  . Esophageal dysphagia 09/07/2017  . Health maintenance examination 04/13/2017  . Decreased hearing, bilateral 04/13/2017  . Advanced care planning/counseling discussion 04/09/2017  . Vitamin D deficiency 02/14/2017  . Fibromuscular dysplasia of renal artery ( [place])   .  Chronic lower back pain 02/09/2017  . Dementia, old age, with behavioral disturbance ([place]) 02/09/2017  . History of melanoma 02/09/2017  . Hyperthyroidism   . Chronic diastolic (congestive) heart failure ([place]) 08/10/2011  . Dyslipidemia (statin intolerance) associated with type 2 diabetes mellitus ([place]) 08/07/2011  . Sinus bradycardia,  beta blocker intol. 08/07/2011  . Chest pain, negative MI, cath with atretic LIMA, and excellent flow down the native LAD,patent grafts otherwise 08/05/2011  . Essential hypertension 08/05/2011  . Ex-smoker 08/05/2011  . Hx of CABG 08/05/2011    Past Surgical History:  Procedure Laterality Date  . ABDOMINAL HYSTERECTOMY  1970   heavy bleeding  . APPENDECTOMY  1960s  . CARDIAC CATHETERIZATION  08/09/2011   no sign. coronary obstruction. LIMA is atretic w/excellent flow down the native LAD  . CARDIAC CATHETERIZATION  08/22/2001   no sign. coronary stenosis,  . CATARACT EXTRACTION, BILATERAL Bilateral   . CESAREAN SECTION    . CORONARY ARTERY BYPASS GRAFT  02-16-13   3'03-no bypasses -Had 2 aneurysm repairs(stents used)  . ESOPHAGOGASTRODUODENOSCOPY  04/2018   LA Grade A esophagitis, small nodule distal esophagus (benign schwann cell hamartoma), empiric dilation to 49m ([place])  . LAPAROTOMY N/A 02/20/2013   Procedure: EXPLORATORY LAPAROTOMY;  WJanie Morning MD  . LEFT HEART CATHETERIZATION WITH CORONARY ANGIOGRAM N/A 08/09/2011   Procedure: LEFT HEART CATHETERIZATION WITH CORONARY ANGIOGRAM;  Surgeon: MSanda Klein MD;  [facility];  Service: Cardiovascular;  Laterality: N/A;  . NM MYOCAR PERF WALL MOTION  07/20/2010   normal  . RADIOLOGY WITH ANESTHESIA N/A 02/22/2019   Procedure: MRI OF ABDOMEN WITH AND WITHOUT CONTRAST;  Surgeon: Radiologist, Medication, MD;  [facility]  Service: Radiology;  Laterality: N/A;  . RADIOLOGY WITH ANESTHESIA Bilateral 09/04/2019   Procedure: MRI WITH ANESTHESIA OF ABDOMEN WITH AND WITHOUT CONTRAST, BRAIN WITHOUT CONTRAST;  Surgeon: Radiologist, Medication, MD;  [facility]  Service: Radiology;  Laterality: Bilateral;  . SALPINGOOPHORECTOMY Bilateral 02/20/2013   benign seromucinous cystadenofibroma R ovary ([name] MD)  . TOOTH EXTRACTION    . TOTAL HIP ARTHROPLASTY Left 2015     OB History   No obstetric history on file.       Family History  Problem Relation Age of Onset  . Stroke Mother   . Hypotension Mother   . Dementia Mother 621 . Coronary artery disease Father   . Hypertension Father   . Breast cancer Maternal Aunt   . Hypotension Sister   . Hypertension Brother   . Diabetes Brother   . Lung cancer Daughter   . Thyroid cancer Daughter   . Colon polyps Maternal Grandfather   . Colon cancer Neg Hx   . Esophageal cancer Neg Hx   . Stomach cancer Neg Hx   . Rectal cancer Neg Hx     Social History   Tobacco Use  . Smoking status: Former Smoker    Packs/day: 0.10    Years: 20.00    Pack years: 2.00    Types: Cigarettes    Quit date: 09/14/2017    Years since quitting: 2.2  . Smokeless tobacco: Never Used  . Tobacco comment: smoking cessation info given  Vaping Use  . Vaping Use: Never used  Substance Use Topics  . Alcohol use: Yes    Comment: occasional  . Drug use: No    Home Medications Prior to Admission medications   Medication Sig Start Date End Date Taking? Authorizing Provider  ACCU-CHEK FASTCLIX LANCETS [place]  1 each by Does not apply route daily. Use as directed to check blood sugar once daily.  Dx code:  E11.65 05/02/18  Yes Ria Bush, MD  albuterol (PROVENTIL) (2.5 MG/3ML) 0.083% nebulizer solution Take 3 mLs (2.5 mg total) by nebulization every 6 (six) hours as needed for wheezing or shortness of breath. 12/13/17  Yes Ria Bush, MD  Alcohol Swabs (B-D SINGLE USE SWABS REGULAR) PADS 1 each by Does not apply route daily. Use as directed to check blood sugar once daily.  Dx code:  E11.65 05/02/18  Yes Ria Bush, MD  ascorbic acid (VITAMIN C) 500 MG tablet Take 500 mg by mouth daily.   Yes [provider]  aspirin EC 81 MG tablet Take 81 mg by mouth daily.   Yes [provider]  Blood Glucose Monitoring Suppl (ACCU-CHEK AVIVA PLUS) w/Device KIT 1 each by Does not apply route daily. Use as directed to check blood sugar once daily.  Dx code:   E11.65 05/02/18  Yes Ria Bush, MD  candesartan (ATACAND) 8 MG tablet Take 1 tablet (8 mg total) by mouth daily. 09/11/19  Yes Ria Bush, MD  cholecalciferol (VITAMIN D3) 25 MCG (1000 UNIT) tablet Take 1,000 Units by mouth daily.   Yes [provider]  Cyanocobalamin (B-12) 1000 MCG SUBL Place 1 tablet under the tongue daily. 08/14/19  Yes Ria Bush, MD  furosemide (LASIX) 40 MG tablet Take 1 tablet (40 mg total) by mouth daily as needed. Take 120 mg (3 tablets) once daily for a weight of 135 pounds and over and take 80 mg (2 tablets) once daily for a weight of 134 and below. Patient taking differently: Take 40 mg by mouth daily as needed for fluid. Take 120 mg (3 tablets) once daily for a weight of 135 pounds and over and take 80 mg (2 tablets) once daily for a weight of 134 and below. 06/27/19  Yes Domenic Polite, MD  metFORMIN (GLUCOPHAGE-XR) 500 MG 24 hr tablet Take 2 tablets (1,000 mg total) by mouth daily with breakfast. 09/04/19  Yes Ria Bush, MD  morphine (MS CONTIN) 15 MG 12 hr tablet Take 1 tablet (15 mg total) by mouth in the morning. Per pain clinic 09/10/19  Yes Ria Bush, MD  morphine (MSIR) 15 MG tablet Take 15 mg by mouth every 6 (six) hours as needed for severe pain.   Yes [provider]  QUEtiapine (SEROQUEL) 25 MG tablet Take 2 tablets (50 mg total) by mouth at bedtime. Patient taking differently: Take 25 mg by mouth at bedtime.  08/28/19  Yes Marcial Pacas, MD  canagliflozin (INVOKANA) 100 MG TABS tablet Take 2 tablets (200 mg total) by mouth daily before breakfast for 14 days. 12/01/19 12/15/19  Kaleyah Labreck, Wenda Overland, MD  glucose blood (ACCU-CHEK AVIVA PLUS) test strip 1 each by Other route as needed for other. Use as instructed to check blood sugar once daily.  Dx code:  E11.65 05/02/18   Ria Bush, MD  LORazepam (ATIVAN) 0.5 MG tablet TAKE 1 TABLET BY MOUTH DAILY AT BEDTIME AS NEEDED FOR ANXIETY OR SLEEP MAY TAKE SECOND 1/2  TABLET AS NEEDED Patient not taking: Reported on 12/01/2019 09/06/19   Ria Bush, MD  memantine (NAMENDA) 5 MG tablet Take 1 tablet (5 mg total) by mouth 2 (two) times daily. First 4 days take 1 tablet daily Patient not taking: Reported on 12/01/2019 08/13/19   Ria Bush, MD    Allergies    Codeine, Prednisolone acetate, Prednisone, Nitroglycerin,  Metolazone, Adhesive [tape], Crestor [rosuvastatin calcium], Lopressor [metoprolol tartrate], Mupirocin, Sulfamethoxazole, and Zetia [ezetimibe]  Review of Systems   Review of Systems  Unable to perform ROS: Dementia    Physical Exam Updated Vital Signs BP 109/85 (BP Location: Right Arm)   Pulse 67   Temp 98 F (36.7 C) (Oral)   Resp 12   Ht 5' (1.524 m)   Wt 58.5 kg   SpO2 100%   BMI 25.19 kg/m   Physical Exam Vitals and nursing note reviewed.  Constitutional:      General: She is not in acute distress.    Appearance: She is well-developed.  HENT:     Head: Normocephalic and atraumatic.     Mouth/Throat:     Mouth: Mucous membranes are moist.     Pharynx: Oropharynx is clear.  Eyes:     Conjunctiva/sclera: Conjunctivae normal.  Cardiovascular:     Rate and Rhythm: Normal rate and regular rhythm.     Heart sounds: Normal heart sounds. No murmur heard.   Pulmonary:     Effort: Pulmonary effort is normal.     Breath sounds: Normal breath sounds.  Abdominal:     General: Bowel sounds are normal. There is no distension.     Palpations: Abdomen is soft.     Tenderness: There is no abdominal tenderness.  Musculoskeletal:     Cervical back: Neck supple.  Skin:    General: Skin is warm and dry.  Neurological:     Mental Status: She is alert.     Comments: Following basic commands  Psychiatric:     Comments: Quiet, cooperative     ED Results / Procedures / Treatments   Labs (all labs ordered are listed, but only abnormal results are displayed) Labs Reviewed  COMPREHENSIVE METABOLIC PANEL - Abnormal;  Notable for the following components:      Result Value   Sodium 133 (*)    CO2 21 (*)    Glucose, Bld 310 (*)    All other components within normal limits  URINALYSIS, ROUTINE W REFLEX MICROSCOPIC - Abnormal; Notable for the following components:   Color, Urine STRAW (*)    Glucose, UA >=500 (*)    All other components within normal limits  CBG MONITORING, ED - Abnormal; Notable for the following components:   Glucose-Capillary 287 (*)    All other components within normal limits  I-STAT VENOUS BLOOD GAS, ED - Abnormal; Notable for the following components:   pCO2, Ven 43.5 (*)    All other components within normal limits  CBC WITH DIFFERENTIAL/PLATELET    EKG None  Radiology No results found.  Procedures Procedures (including critical care time)  Medications Ordered in ED Medications - No data to display  ED Course  I have reviewed the triage vital signs and the nursing notes.  Pertinent labs that were available during my care of the patient were reviewed by me and considered in my medical decision making (see chart for details).    MDM Rules/Calculators/A&P                          Comfortable on exam. Labs show no evidence of UTI, no evidence of DKA. BG 310, normal Cr and normal AG.  Patient remains comfortable on reassessment.  After review of medications, I have decided to increase Invokana to 227m daily until she can see PCP.  Discussed importance of close PCP follow-up for management of diabetes medications  and good hydration until that time.  Return precautions reviewed with daughter. Final Clinical Impression(s) / ED Diagnoses Final diagnoses:  Hyperglycemia    Rx / DC Orders ED Discharge Orders         Ordered    canagliflozin (INVOKANA) 100 MG TABS tablet  Daily before breakfast     Discontinue  Reprint     12/01/19 [place], Wenda Overland, MD 12/01/19 1145

## 2019-12-01 NOTE — ED Triage Notes (Signed)
Daughter stated, her last sugar was 329, and its been high and low.

## 2019-12-03 ENCOUNTER — Ambulatory Visit: Payer: Medicare HMO | Admitting: Neurology

## 2019-12-03 ENCOUNTER — Telehealth: Payer: Self-pay | Admitting: *Deleted

## 2019-12-03 ENCOUNTER — Encounter: Payer: Self-pay | Admitting: Neurology

## 2019-12-03 VITALS — BP 132/77 | HR 68 | Ht 58.75 in | Wt 129.5 lb

## 2019-12-03 DIAGNOSIS — F0391 Unspecified dementia with behavioral disturbance: Secondary | ICD-10-CM

## 2019-12-03 DIAGNOSIS — F03918 Unspecified dementia, unspecified severity, with other behavioral disturbance: Secondary | ICD-10-CM

## 2019-12-03 MED ORDER — GLIPIZIDE 5 MG PO TABS
5.0000 mg | ORAL_TABLET | Freq: Every day | ORAL | 1 refills | Status: DC
Start: 2019-12-03 — End: 2019-12-04

## 2019-12-03 NOTE — Telephone Encounter (Signed)
Spoke with pt's daughter, [name] (on dpr), relaying Dr. Synthia Innocent message.  She verbalizes understanding and agrees to start glipizide.  FYI to Dr. Darnell Level.

## 2019-12-03 NOTE — Telephone Encounter (Signed)
Spoke with [name] relaying Dr. Synthia Innocent message.  Verbalizes understanding.

## 2019-12-03 NOTE — Progress Notes (Signed)
Ronny Flurry    PATIENT: Meredith Gutierrez DOB: [birth year]  Chief Complaint  Patient presents with  . Dementia    She is here with her daughter, [name]. No new concerns. Seroquel 75m, 1-2 tablets at bedtime has been helpful for her sleep and hallucinations. Her recent MMSE was 10/30.     HISTORICAL  Forever A WLamais a 79year old female, seen in request by her primary care physician Dr. GRia Bushfor evaluation of dementia, initial evaluation was on August 28, 2019.  I have reviewed and summarized the referring note from the referring physician.  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 she was noted to have gradual onset of memory loss since 2017, gradually getting worse, had 24-hour care since 2019, she had significant worsening of memory loss, gait abnormality, hallucinations since her hospital admission for Covid virus pneumonia on June 27, 2019  She is now back home, has evening time agitation, frequent pacing around, difficulty sleeping, carry on a conversation during her hallucinations, noticed more difficulty walking, dragging her right leg more,  Previously she has tried Aricept did not notice any improvement,  I personally reviewed CT head without contrast in July 2020, chronic atrophy, supratentorium small vessel disease   Laboratory evaluations in 2021 showed normal B12, elevated triglyceride 202, CMP showed mild elevated glucose 123, normal CBC, TSH, A1c  7.2  UPDATE December 03 2019: She is accompanied by her daughter at today's clinical visit, Seroquel 25 mg 1 to 2 tablets at night works well for her, she can sleep much better, through night, only occasionally take 2 tablets, she has less hallucinations  We personally reviewed MRI of the brain without contrast in March 2021: Moderate  generalized atrophy, no acute abnormalities  REVIEW OF SYSTEMS: Full 14 system review of systems performed and notable only for as above All other review of systems were negative.  ALLERGIES: Allergies  Allergen Reactions  . Codeine Anaphylaxis and Shortness Of Breath  . Prednisolone Acetate Shortness Of Breath and Nausea Only  . Prednisone Hives, Shortness Of Breath and Swelling  . Nitroglycerin Other (See Comments)    Reaction not recalled   . Metolazone Other (See Comments)    Hypokalemia, hyponatremia  . Adhesive [Tape] Rash and Other (See Comments)    SKIN IS VERY SENSITIVE- TEARS AND BRUISES EASILY, also  . Crestor [Rosuvastatin Calcium] Other (See Comments)    Weakness with statins  . Lopressor [Metoprolol Tartrate] Other (See Comments)    Bradycardia   . Mupirocin Rash  . Sulfamethoxazole Hives and Swelling  . Zetia [Ezetimibe] Other (See Comments)    Weakness     HOME MEDICATIONS: Current Outpatient Medications  Medication Sig Dispense Refill  . ACCU-CHEK FASTCLIX LANCETS MISC 1 each by Does not apply route daily. Use as directed to check blood sugar once daily.  Dx code:  E11.65 100 each 0  . albuterol (PROVENTIL) (2.5 MG/3ML) 0.083% nebulizer solution Take 3 mLs (2.5 mg total) by nebulization every 6 (six) hours as needed for wheezing or shortness of breath. 75 mL 1  . Alcohol Swabs (B-D SINGLE USE SWABS REGULAR) PADS 1 each by Does not apply route daily. Use as directed to check blood sugar once daily.  Dx code:  E11.65 100 each 0  . ascorbic acid (VITAMIN C) 500 MG tablet  Take 500 mg by mouth daily.    Marland Kitchen aspirin EC 81 MG tablet Take 81 mg by mouth daily.    . Blood Glucose Monitoring Suppl (ACCU-CHEK AVIVA PLUS) w/Device KIT 1 each by Does not apply route daily. Use as directed to check blood sugar once daily.  Dx code:  E11.65 1 kit 0  . canagliflozin (INVOKANA) 100 MG TABS tablet Take 2 tablets (200 mg total) by mouth daily before breakfast for 14 days. 28 tablet  0  . candesartan (ATACAND) 8 MG tablet Take 1 tablet (8 mg total) by mouth daily. 90 tablet 1  . cholecalciferol (VITAMIN D3) 25 MCG (1000 UNIT) tablet Take 1,000 Units by mouth daily.    . Cyanocobalamin (B-12) 1000 MCG SUBL Place 1 tablet under the tongue daily.    . furosemide (LASIX) 40 MG tablet Take 1 tablet (40 mg total) by mouth daily as needed. Take 120 mg (3 tablets) once daily for a weight of 135 pounds and over and take 80 mg (2 tablets) once daily for a weight of 134 and below. (Patient taking differently: Take 40 mg by mouth daily as needed for fluid. Take 120 mg (3 tablets) once daily for a weight of 135 pounds and over and take 80 mg (2 tablets) once daily for a weight of 134 and below.)    . glucose blood (ACCU-CHEK AVIVA PLUS) test strip 1 each by Other route as needed for other. Use as instructed to check blood sugar once daily.  Dx code:  E11.65 100 each 0  . LORazepam (ATIVAN) 0.5 MG tablet TAKE 1 TABLET BY MOUTH DAILY AT BEDTIME AS NEEDED FOR ANXIETY OR SLEEP MAY TAKE SECOND 1/2 TABLET AS NEEDED 45 tablet 0  . memantine (NAMENDA) 5 MG tablet Take 1 tablet (5 mg total) by mouth 2 (two) times daily. First 4 days take 1 tablet daily 60 tablet 3  . metFORMIN (GLUCOPHAGE-XR) 500 MG 24 hr tablet Take 2 tablets (1,000 mg total) by mouth daily with breakfast. 180 tablet 2  . morphine (MS CONTIN) 15 MG 12 hr tablet Take 1 tablet (15 mg total) by mouth in the morning. Per pain clinic    . morphine (MSIR) 15 MG tablet Take 15 mg by mouth every 6 (six) hours as needed for severe pain.    Marland Kitchen QUEtiapine (SEROQUEL) 25 MG tablet Take 2 tablets (50 mg total) by mouth at bedtime. (Patient taking differently: Take 25-50 mg by mouth at bedtime. ) 60 tablet 11  . glipiZIDE (GLUCOTROL) 5 MG tablet Take 1 tablet (5 mg total) by mouth daily before breakfast. 30 tablet 1   No current facility-administered medications for this visit.    PAST MEDICAL HISTORY: Past Medical History:  Diagnosis Date  .  Angina    never needed to take nitroglycerin.  . Anxiety   . Blood transfusion without reported diagnosis   . Cerebral atherosclerosis   . Community acquired pneumonia of left lower lobe of lung 12/20/2018  . COPD (chronic obstructive pulmonary disease) ([place])    02-16-13-"pt. denies this"-shows on CXR of 2- 2013.  Marland Kitchen Coronary artery disease 2003   s/p CABG 2014  . COVID-19 virus infection 06/2019   Covid-19 PNA  . Dementia ([place])   . Diabetes mellitus without complication ([place]) 46/9629   NEW ONSET  . Diastolic CHF, on admit treated. 08/10/2011  . Family history of adverse reaction to anesthesia    Dad and Sister had trouble waking up  . Fibromuscular dysplasia  of renal artery ([place])    right  . Heart murmur   . History of kidney stones 02-16-13   "hx. of overgrowth of muscle-causing some stricture"renal artery stenosis-being followed [facility]  . Hyperlipidemia   . Hypertension   . Hyperthyroidism    [place]  . Hyponatremia 04/2018  . Melanoma ([place]) 2010   skin cancers, 1 melanoma removed back  . Osteoarthritis   . Pneumonia due to COVID-19 virus 06/26/2019    PAST SURGICAL HISTORY: Past Surgical History:  Procedure Laterality Date  . ABDOMINAL HYSTERECTOMY  1970   heavy bleeding  . APPENDECTOMY  1960s  . CARDIAC CATHETERIZATION  08/09/2011   no sign. coronary obstruction. LIMA is atretic w/excellent flow down the native LAD  . CARDIAC CATHETERIZATION  08/22/2001   no sign. coronary stenosis,  . CATARACT EXTRACTION, BILATERAL Bilateral   . CESAREAN SECTION    . CORONARY ARTERY BYPASS GRAFT  02-16-13   3'03-no bypasses -Had 2 aneurysm repairs(stents used)  . ESOPHAGOGASTRODUODENOSCOPY  04/2018   LA Grade A esophagitis, small nodule distal esophagus (benign schwann cell hamartoma), empiric dilation to 41m ([facility])  . LAPAROTOMY N/A 02/20/2013   Procedure: EXPLORATORY LAPAROTOMY;  WJanie Morning MD  . LEFT HEART CATHETERIZATION WITH CORONARY ANGIOGRAM N/A 08/09/2011   Procedure: LEFT  HEART CATHETERIZATION WITH CORONARY ANGIOGRAM;  Surgeon: MSanda Klein MD;  [facility];  Service: Cardiovascular;  Laterality: N/A;  . NM MYOCAR PERF WALL MOTION  07/20/2010   normal  . RADIOLOGY WITH ANESTHESIA N/A 02/22/2019   Procedure: MRI OF ABDOMEN WITH AND WITHOUT CONTRAST;  Surgeon: Radiologist, Medication, MD;  [facility]  Service: Radiology;  Laterality: N/A;  . RADIOLOGY WITH ANESTHESIA Bilateral 09/04/2019   Procedure: MRI WITH ANESTHESIA OF ABDOMEN WITH AND WITHOUT CONTRAST, BRAIN WITHOUT CONTRAST;  Surgeon: Radiologist, Medication, MD;  [facility]  Service: Radiology;  Laterality: Bilateral;  . SALPINGOOPHORECTOMY Bilateral 02/20/2013   benign seromucinous cystadenofibroma R ovary ([name] MD)  . TOOTH EXTRACTION    . TOTAL HIP ARTHROPLASTY Left 2015    FAMILY HISTORY: Family History  Problem Relation Age of Onset  . Stroke Mother   . Hypotension Mother   . Dementia Mother 6107 . Coronary artery disease Father   . Hypertension Father   . Breast cancer Maternal Aunt   . Hypotension Sister   . Hypertension Brother   . Diabetes Brother   . Lung cancer Daughter   . Thyroid cancer Daughter   . Colon polyps Maternal Grandfather   . Colon cancer Neg Hx   . Esophageal cancer Neg Hx   . Stomach cancer Neg Hx   . Rectal cancer Neg Hx     SOCIAL HISTORY: Social History   Socioeconomic History  . Marital status: Widowed    Spouse name: Not on file  . Number of children: 2  . Years of education: 185 . Highest education level: High school graduate  Occupational History  . Occupation: Retired  Tobacco Use  . Smoking status: Former Smoker    Packs/day: 0.10    Years: 20.00    Pack years: 2.00    Types: Cigarettes    Quit date: 09/14/2017    Years since quitting: 2.2  . Smokeless tobacco: Never Used  . Tobacco comment: smoking cessation info given  Vaping Use  . Vaping Use: Never used  Substance and Sexual Activity  . Alcohol use: Yes     Comment: occasional  . Drug use: No  .  Sexual activity: Not on file  Other Topics Concern  . Not on file  Social History Narrative   Widow   Lives with daughter and daughter's GF   Edu: HS   Occ: retired, worked at [employer]   Activity: no regular exercise   Diet: good water    Right-handed.   Occasional caffeine use.   Social Determinants of Health   Financial Resource Strain: Low Risk   . Difficulty of Paying Living Expenses: Not hard at all  Food Insecurity: No Food Insecurity  . Worried About Charity fundraiser in the Last Year: Never true  . Ran Out of Food in the Last Year: Never true  Transportation Needs: No Transportation Needs  . Lack of Transportation (Medical): No  . Lack of Transportation (Non-Medical): No  Physical Activity: Inactive  . Days of Exercise per Week: 0 days  . Minutes of Exercise per Session: 0 min  Stress: No Stress Concern Present  . Feeling of Stress : Not at all  Social Connections:   . Frequency of Communication with Friends and Family:   . Frequency of Social Gatherings with Friends and Family:   . Attends Religious Services:   . Active Member of Clubs or Organizations:   . Attends Archivist Meetings:   Marland Kitchen Marital Status:   Intimate Partner Violence: Not At Risk  . Fear of Current or Ex-Partner: No  . Emotionally Abused: No  . Physically Abused: No  . Sexually Abused: No     PHYSICAL EXAM   Vitals:   12/03/19 1431  BP: 132/77  Pulse: 68  Weight: 129 lb 8 oz (58.7 kg)  Height: 4' 10.75" (1.492 m)   Not recorded     Body mass index is 26.38 kg/m.  PHYSICAL EXAMNIATION:  Gen: NAD, conversant, well nourised, well groomed                     Cardiovascular: Regular rate rhythm, no peripheral edema, warm, nontender. Eyes: Conjunctivae clear without exudates or hemorrhage Neck: Supple, no carotid bruits. Pulmonary: Clear to auscultation bilaterally   NEUROLOGICAL EXAM:  MMSE - Mini Mental State Exam 08/28/2019  04/23/2019 04/07/2018  Not completed: - Unable to complete (No Data)  Orientation to time 1 - -  Orientation to Place 1 - -  Registration 3 - -  Attention/ Calculation 0 - -  Recall 0 - -  Recall-comments - - -  Language- name 2 objects 1 - -  Language- repeat 0 - -  Language- follow 3 step command 2 - -  Language- follow 3 step command-comments - - -  Language- read & follow direction 1 - -  Write a sentence 1 - -  Copy design 0 - -  Total score 10 - -  Animal naming 2   CRANIAL NERVES: CN II: Visual fields are full to confrontation. Pupils are round equal and briskly reactive to light. CN III, IV, VI: extraocular movement are normal. No ptosis. CN V: Facial sensation is intact to light touch CN VII: Face is symmetric with normal eye closure  CN VIII: Hearing is normal to causal conversation. CN IX, X: Phonation is normal. CN XI: Head turning and shoulder shrug are intact  MOTOR: There is no pronator drift of out-stretched arms. Muscle bulk and tone are normal. Muscle strength is normal.  REFLEXES: Reflexes are hypoactive and symmetric at the biceps, triceps, knees, and ankles. Plantar responses are flexor.  SENSORY: Intact to light  touch,  COORDINATION: There is no trunk or limb dysmetria noted.  GAIT/STANCE: She needs push-up to get up from seated position, dragging her right leg some, kyphosis   DIAGNOSTIC DATA (LABS, IMAGING, TESTING) - I reviewed patient records, labs, notes, testing and imaging myself where available.   ASSESSMENT AND PLAN  Meredith Gutierrez is a 79 y.o. female   Dementia with behavioral issues  She has frequent hallucinations, difficulty sleeping at nighttime, which has much improved with Seroquel 25 mg every night, only occasionally requires 2 tablets  Keep Namenda 5 mg twice a day, may titrating to 10 mg twice a day at next next follow-up visit in 6 months with Thea Alken, M.D. Ph.D.  [facility] [address]  [place], [place] [address] Ph: [phone] Fax: [phone]  CC: Ria Bush, MD

## 2019-12-03 NOTE — Telephone Encounter (Signed)
[name] had left v/m earlier wanting to know if pt should continue current regimen of med and add new med or stop a med and then add new med. I spoke with Lattie Haw CMA who just spoke with [name] at 4:17 today and Lattie Haw CMA said could disregard that v/m Lattie Haw CMA had already clarified the med question with [name].

## 2019-12-03 NOTE — Telephone Encounter (Signed)
Patient's daughter [name] called stating that she had to take her mom to the ER Saturday because of elevated blood sugar readings.[name] stated that they made some changes on her mom's sugar medications but her readings are still elevated. [name] stated over the weekend her mom's sugar readings were over 400 and the best she got was 287. [name] stated this morning her fasting blood sugar is 331. Patient scheduled an appointment tomorrow 12/04/19 at 12:00 noon to see Dr. Danise Mina. Patient has an appointment scheduled today to see her neurologist.

## 2019-12-03 NOTE — Telephone Encounter (Addendum)
Med sent to pharmacy. Watch for rash with starting med given prior h/o allergy to sulfa.

## 2019-12-03 NOTE — Addendum Note (Signed)
Addended by: Ria Bush on: 12/03/2019 02:54 PM   Modules accepted: Orders

## 2019-12-03 NOTE — Telephone Encounter (Signed)
Would offer to start glipizide 5mg  with food once daily vs one of the injectable medicines (insulin vs noninsulin).  We can further review at appt tomorrow.  In interim ensure good hydration with plenty of water.

## 2019-12-04 ENCOUNTER — Other Ambulatory Visit: Payer: Self-pay

## 2019-12-04 ENCOUNTER — Encounter: Payer: Self-pay | Admitting: Family Medicine

## 2019-12-04 ENCOUNTER — Ambulatory Visit (INDEPENDENT_AMBULATORY_CARE_PROVIDER_SITE_OTHER): Payer: Medicare HMO | Admitting: Family Medicine

## 2019-12-04 VITALS — BP 120/68 | HR 88 | Temp 97.9°F | Ht 58.75 in | Wt 129.2 lb

## 2019-12-04 DIAGNOSIS — F0391 Unspecified dementia with behavioral disturbance: Secondary | ICD-10-CM | POA: Diagnosis not present

## 2019-12-04 DIAGNOSIS — R1310 Dysphagia, unspecified: Secondary | ICD-10-CM

## 2019-12-04 DIAGNOSIS — I824Z2 Acute embolism and thrombosis of unspecified deep veins of left distal lower extremity: Secondary | ICD-10-CM | POA: Diagnosis not present

## 2019-12-04 DIAGNOSIS — E118 Type 2 diabetes mellitus with unspecified complications: Secondary | ICD-10-CM | POA: Diagnosis not present

## 2019-12-04 DIAGNOSIS — F03918 Unspecified dementia, unspecified severity, with other behavioral disturbance: Secondary | ICD-10-CM

## 2019-12-04 DIAGNOSIS — K861 Other chronic pancreatitis: Secondary | ICD-10-CM | POA: Diagnosis not present

## 2019-12-04 DIAGNOSIS — E1165 Type 2 diabetes mellitus with hyperglycemia: Secondary | ICD-10-CM | POA: Diagnosis not present

## 2019-12-04 DIAGNOSIS — IMO0002 Reserved for concepts with insufficient information to code with codable children: Secondary | ICD-10-CM

## 2019-12-04 DIAGNOSIS — I25810 Atherosclerosis of coronary artery bypass graft(s) without angina pectoris: Secondary | ICD-10-CM

## 2019-12-04 DIAGNOSIS — R1319 Other dysphagia: Secondary | ICD-10-CM

## 2019-12-04 LAB — POCT GLUCOSE (DEVICE FOR HOME USE): POC Glucose: 287 mg/dl — AB (ref 70–99)

## 2019-12-04 LAB — HEMOGLOBIN A1C: Hgb A1c MFr Bld: 9.7 % — ABNORMAL HIGH (ref 4.6–6.5)

## 2019-12-04 MED ORDER — CANAGLIFLOZIN 300 MG PO TABS: 300.0000 mg | ORAL_TABLET | Freq: Every day | ORAL | 11 refills | Status: DC

## 2019-12-04 NOTE — Assessment & Plan Note (Signed)
Appreciate neuro care. Continue seroquel 25mg  nightly and namenda 5mg  bid. seroquel could contribute to hyperglycemia however she's been on this for several months now.

## 2019-12-04 NOTE — Progress Notes (Signed)
This visit was conducted in person.  BP 120/68 (BP Location: Left Arm, Patient Position: Sitting, Cuff Size: Normal)   Pulse 88   Temp 97.9 F (36.6 C) (Temporal)   Ht 4' 10.75" (1.492 m)   Wt 129 lb 3 oz (58.6 kg)   SpO2 100%   BMI 26.32 kg/m    CC: ER f/u visit  Subjective:    Patient ID: Meredith Gutierrez, female    DOB: Apr 12, 1941, 79 y.o.   MRN: [id number]  HPI: Meredith Gutierrez is a 79 y.o. female presenting on 12/04/2019 for Hospitalization Follow-up (Seen at [facility] ED on 12/01/19.  BS first thing this morning, 310.  Pt accompanied by daughter, [name]- temp 92.9.Marland Kitchen)   Recent ER visit for hyperglycemia cbg's to 400s with occasional dizziness, flushing. This is despite invokana 138m daily, metformin XR 10057mdaily. Workup largely reassuring. ER increased invokana to 20083mnd I started glipizide 5mg18mth breakfast. First dose was this morning around 8am (cbg at that time 310). Sudden deterioration of glycemic control on Friday - sugars shot up from 160-200 average to >400 average - without new diet changes, new medicine. ER workup negative for infection.   Saw neurology Dr Yan Krista Blueterday - stable period for frequent hallucinations and insomnia on seroquel 25mg42mhtly, planned continued namenda 5mg b73m   Recent GI ([facility]) eval for chronic pancreatitis and pancreatic cysts - planning to monitor for now, no indication for EUS/further evaluation. Mild dysphagia which did improve after empiric dilation. Will monitor for now.      Relevant past medical, surgical, family and social history reviewed and updated as indicated. Interim medical history since our last visit reviewed. Allergies and medications reviewed and updated. Outpatient Medications Prior to Visit  Medication Sig Dispense Refill  . ACCU-CHEK FASTCLIX LANCETS MISC 1 each by Does not apply route daily. Use as directed to check blood sugar once daily.  Dx code:  E11.65 100 each 0  . albuterol (PROVENTIL) (2.5 MG/3ML) 0.083%  nebulizer solution Take 3 mLs (2.5 mg total) by nebulization every 6 (six) hours as needed for wheezing or shortness of breath. 75 mL 1  . Alcohol Swabs (B-D SINGLE USE SWABS REGULAR) PADS 1 each by Does not apply route daily. Use as directed to check blood sugar once daily.  Dx code:  E11.65 100 each 0  . ascorbic acid (VITAMIN C) 500 MG tablet Take 500 mg by mouth daily.    . aspiMarland Kitchenin EC 81 MG tablet Take 81 mg by mouth daily.    . Blood Glucose Monitoring Suppl (ACCU-CHEK AVIVA PLUS) w/Device KIT 1 each by Does not apply route daily. Use as directed to check blood sugar once daily.  Dx code:  E11.65 1 kit 0  . candesartan (ATACAND) 8 MG tablet Take 1 tablet (8 mg total) by mouth daily. 90 tablet 1  . cholecalciferol (VITAMIN D3) 25 MCG (1000 UNIT) tablet Take 1,000 Units by mouth daily.    . Cyanocobalamin (B-12) 1000 MCG SUBL Place 1 tablet under the tongue daily.    . furosemide (LASIX) 40 MG tablet Take 1 tablet (40 mg total) by mouth daily as needed. Take 120 mg (3 tablets) once daily for a weight of 135 pounds and over and take 80 mg (2 tablets) once daily for a weight of 134 and below. (Patient taking differently: Take 40 mg by mouth daily as needed for fluid. Take 120 mg (3 tablets) once daily for a weight of 135 pounds and over  and take 80 mg (2 tablets) once daily for a weight of 134 and below.)    . glipiZIDE (GLUCOTROL) 5 MG tablet Take 1 tablet (5 mg total) by mouth daily as needed (if cbg > 250, take with breakfast).    Marland Kitchen glucose blood (ACCU-CHEK AVIVA PLUS) test strip 1 each by Other route as needed for other. Use as instructed to check blood sugar once daily.  Dx code:  E11.65 100 each 0  . memantine (NAMENDA) 5 MG tablet Take 1 tablet (5 mg total) by mouth 2 (two) times daily. First 4 days take 1 tablet daily 60 tablet 3  . metFORMIN (GLUCOPHAGE-XR) 500 MG 24 hr tablet Take 2 tablets (1,000 mg total) by mouth daily with breakfast. 180 tablet 2  . morphine (MS CONTIN) 15 MG 12 hr tablet  Take 1 tablet (15 mg total) by mouth in the morning. Per pain clinic    . morphine (MSIR) 15 MG tablet Take 15 mg by mouth every 6 (six) hours as needed for severe pain.    Marland Kitchen QUEtiapine (SEROQUEL) 25 MG tablet Take 2 tablets (50 mg total) by mouth at bedtime. (Patient taking differently: Take 25-50 mg by mouth at bedtime. ) 60 tablet 11  . canagliflozin (INVOKANA) 100 MG TABS tablet Take 2 tablets (200 mg total) by mouth daily before breakfast for 14 days. 28 tablet 0  . glipiZIDE (GLUCOTROL) 5 MG tablet Take 1 tablet (5 mg total) by mouth daily before breakfast. 30 tablet 1  . LORazepam (ATIVAN) 0.5 MG tablet TAKE 1 TABLET BY MOUTH DAILY AT BEDTIME AS NEEDED FOR ANXIETY OR SLEEP MAY TAKE SECOND 1/2 TABLET AS NEEDED 45 tablet 0   No facility-administered medications prior to visit.     Per HPI unless specifically indicated in ROS section below Review of Systems Objective:  BP 120/68 (BP Location: Left Arm, Patient Position: Sitting, Cuff Size: Normal)   Pulse 88   Temp 97.9 F (36.6 C) (Temporal)   Ht 4' 10.75" (1.492 m)   Wt 129 lb 3 oz (58.6 kg)   SpO2 100%   BMI 26.32 kg/m   Wt Readings from Last 3 Encounters:  12/04/19 129 lb 3 oz (58.6 kg)  12/03/19 129 lb 8 oz (58.7 kg)  12/01/19 129 lb (58.5 kg)      Physical Exam Vitals and nursing note reviewed.  Constitutional:      Appearance: Normal appearance. She is not Gutierrez-appearing.  Cardiovascular:     Rate and Rhythm: Normal rate and regular rhythm.     Pulses: Normal pulses.     Heart sounds: Normal heart sounds. No murmur heard.   Pulmonary:     Effort: Pulmonary effort is normal. No respiratory distress.     Breath sounds: Normal breath sounds. No wheezing, rhonchi or rales.  Musculoskeletal:     Right lower leg: No edema.     Left lower leg: No edema.  Skin:    General: Skin is warm and dry.     Findings: No rash.  Neurological:     Mental Status: She is alert.  Psychiatric:        Mood and Affect: Mood normal.         Behavior: Behavior normal.       Results for orders placed or performed in visit on 12/04/19  POCT Glucose (Device for Home Use)  Result Value Ref Range   Glucose Fasting, POC     POC Glucose 287 (A) 70 - 99  mg/dl   Lab Results  Component Value Date   HGBA1C 6.9 (H) 06/26/2019   Assessment & Plan:  This visit occurred during the SARS-CoV-2 public health emergency.  Safety protocols were in place, including screening questions prior to the visit, additional usage of staff PPE, and extensive cleaning of exam room while observing appropriate contact time as indicated for disinfecting solutions.   Problem List Items Addressed This Visit    Lower leg DVT (deep venous thromboembolism), acute, left ([place])    Completed 3 months of xarelto (1-09/2019), now off this.       Esophageal dysphagia    Stable period.       Diabetes mellitus type 2, uncontrolled, with complications ([place]) - Primary    Sudden acute deterioration in glycemic control with no inciting cause found.  Update a1c and insulin levels today, as well as cbg after first glipizide dose this morning. I suspect due to pancreas burning out leading to insulin deficiency however hesitant to add insulin with cardiac history.  Discussed other options whether to increase invokana to 353m daily with glipizide 530mwith breakfast PRN hyperglycemia or start glipizide 84m24maily with breakfast and keep invokana at 200m62mily. Today's cbg will help determine direction of further treatment.  cbg 310 --> 287 after glipizide 84mg 30ms morning, no significant improvement. Will increase invokana to 300mg 63my and change glipizide to 84mg wi31mbreakfast PRN cbg >250. Keep us [place] with sugar readings over the next few weeks.       Relevant Medications   glipiZIDE (GLUCOTROL) 5 MG tablet   canagliflozin (INVOKANA) 300 MG TABS tablet   Other Relevant Orders   Hemoglobin A1c   Insulin, random   POCT Glucose (Device for Home Use) (Completed)    Dementia, old age, with behavioral disturbance (HCC)   Mullenpreciate neuro care. Continue seroquel 284mg ni43my and namenda 84mg bid.484mroquel could contribute to hyperglycemia however she's been on this for several months now.       Coronary artery disease involving coronary bypass graft of native heart without angina pectoris   Chronic pancreatitis (HCC)    ARochestereciate GI care - planned monitoring pancreas for now.           Meds ordered this encounter  Medications  . canagliflozin (INVOKANA) 300 MG TABS tablet    Sig: Take 1 tablet (300 mg total) by mouth daily before breakfast.    Dispense:  30 tablet    Refill:  11    Note new sig   Orders Placed This Encounter  Procedures  . Hemoglobin A1c  . Insulin, random  . POCT Glucose (Device for Home Use)    Patient Instructions  Sugar check today. Labs today to check insulin production and A1c.  Let's increase invokana to 300mg dail684montinue glipizide 84mg but ta64monly if sugars >250 in the morning, always take with breakfast.  Keep us updated [place] sugar readings at home over the next few weeks. Reschedule next week's appointment to 3 months from now, for a physical as we're overdue.    Follow up plan: Return in about 3 months (around 03/05/2020) for follow up visit.  Alveria Mcglaughlin GutiRia Bush

## 2019-12-04 NOTE — Assessment & Plan Note (Addendum)
Sudden acute deterioration in glycemic control with no inciting cause found.  Update a1c and insulin levels today, as well as cbg after first glipizide dose this morning. I suspect due to pancreas burning out leading to insulin deficiency however hesitant to add insulin with cardiac history.  Discussed other options whether to increase invokana to 300mg  daily with glipizide 5mg  with breakfast PRN hyperglycemia or start glipizide 5mg  daily with breakfast and keep invokana at 200mg  daily. Today's cbg will help determine direction of further treatment.  cbg 310 --> 287 after glipizide 5mg  this morning, no significant improvement. Will increase invokana to 300mg  daily and change glipizide to 5mg  with breakfast PRN cbg >250. Keep [place] updated with sugar readings over the next few weeks.

## 2019-12-04 NOTE — Patient Instructions (Addendum)
Sugar check today. Labs today to check insulin production and A1c.  Let's increase invokana to 300mg  daily. Continue glipizide 5mg  but take only if sugars >250 in the morning, always take with breakfast.  Keep [place] updated with sugar readings at home over the next few weeks. Reschedule next week's appointment to 3 months from now, for a physical as we're overdue.

## 2019-12-04 NOTE — Assessment & Plan Note (Signed)
Completed 3 months of xarelto (1-09/2019), now off this.

## 2019-12-04 NOTE — Assessment & Plan Note (Signed)
Appreciate GI care - planned monitoring pancreas for now.

## 2019-12-04 NOTE — Assessment & Plan Note (Signed)
Stable period.  

## 2019-12-05 LAB — INSULIN, RANDOM: Insulin: 16.2 u[IU]/mL

## 2019-12-06 ENCOUNTER — Telehealth: Payer: Self-pay

## 2019-12-06 ENCOUNTER — Encounter: Payer: Self-pay | Admitting: Family Medicine

## 2019-12-06 MED ORDER — NYSTATIN 100000 UNIT/GM EX CREA: 1.0000 "application " | TOPICAL_CREAM | Freq: Two times a day (BID) | CUTANEOUS | 1 refills | Status: DC

## 2019-12-06 MED ORDER — FLUCONAZOLE 150 MG PO TABS
150.0000 mg | ORAL_TABLET | Freq: Once | ORAL | 0 refills | Status: AC
Start: 2019-12-06 — End: 2019-12-06

## 2019-12-06 NOTE — Telephone Encounter (Signed)
Spoke with Meredith Gutierrez relaying Dr. Synthia Innocent message.  She verbalizes understanding.  Also, confirms she saw lab results and message in [name] today.

## 2019-12-06 NOTE — Telephone Encounter (Signed)
Meredith Gutierrez (DPR signed) said after starting Invokana pt started with diaper like rash (red fine rash) on genitals. No rash anywhere else; no blister like areas. No difficulty breathing and no swollen mouth or throat.  [facility]. Carol request cb.

## 2019-12-06 NOTE — Telephone Encounter (Signed)
Likely invokana contributing to candidal yeast infection.  Recommend treat with diflucan 150mg  x1, as well as nystatin cream to area. meds sent to pharmacy. If recurrent infection, may need to drop invokana back to 100mg  dose and start insulin as discussed. Also see recent lab result note - I don't see [name] has read yet.

## 2019-12-09 ENCOUNTER — Encounter: Payer: Self-pay | Admitting: Family Medicine

## 2019-12-10 MED ORDER — GLIPIZIDE 5 MG PO TABS: 5.0000 mg | ORAL_TABLET | Freq: Two times a day (BID) | ORAL | 3 refills | Status: DC | PRN

## 2019-12-11 ENCOUNTER — Ambulatory Visit: Payer: Medicare HMO | Admitting: Family Medicine

## 2019-12-11 ENCOUNTER — Encounter: Payer: Self-pay | Admitting: Family Medicine

## 2019-12-12 ENCOUNTER — Telehealth: Payer: Self-pay | Admitting: Gastroenterology

## 2019-12-12 NOTE — Telephone Encounter (Signed)
I don't think we need to do anything now. She recently had an MRI 3 months ago showing stable changes of the pancreatic cysts and chronic pancreatitis. You need to lose a lot of the pancreas function to have worsening DM, while that is possible in her situation I think perhaps less likely. She will continue with surveillance of her pancreas otherwise but given her imaging was so recent just a few months ago, I think low yield of repeating it now, would await her course over the next few months, she can see me in the office to discuss further if she would like in the interim

## 2019-12-12 NOTE — Telephone Encounter (Signed)
Dr. Havery Moros.  Please review the labs from Dr. Leo Grosser.  Daughter is asking if any additional GI pancreatic w/o is needed?

## 2019-12-12 NOTE — Telephone Encounter (Signed)
Pt's daughter [name] called stating that pt's sugar levels have been spiking lately. Her PCP thinks that her pancreas may have stopped working. [name] would like Dr. Doyne Keel input on this and if he would consider to do some testing on her pancreas. Pls call her.

## 2019-12-13 NOTE — Telephone Encounter (Signed)
Spoke with patient's daughter, [name], regarding recommendations. Daughter is agreement with plan to continue surveillance for the next few months, advised that we can get them scheduled to see Dr. Havery Moros if they would like to discuss further.

## 2019-12-13 NOTE — Telephone Encounter (Signed)
Lm on vm for patient to return call 

## 2019-12-16 ENCOUNTER — Encounter: Payer: Self-pay | Admitting: Family Medicine

## 2019-12-17 NOTE — Telephone Encounter (Signed)
plz call to schedule OV per daughter request.

## 2019-12-18 NOTE — Telephone Encounter (Signed)
Spoke with pt's daughter, [name] (on dpr), scheduling OV on 12/28/19 at 9:00.

## 2019-12-19 DIAGNOSIS — M47816 Spondylosis without myelopathy or radiculopathy, lumbar region: Secondary | ICD-10-CM | POA: Diagnosis not present

## 2019-12-19 DIAGNOSIS — R030 Elevated blood-pressure reading, without diagnosis of hypertension: Secondary | ICD-10-CM | POA: Diagnosis not present

## 2019-12-24 DIAGNOSIS — H524 Presbyopia: Secondary | ICD-10-CM | POA: Diagnosis not present

## 2019-12-24 DIAGNOSIS — H40053 Ocular hypertension, bilateral: Secondary | ICD-10-CM | POA: Diagnosis not present

## 2019-12-24 DIAGNOSIS — H349 Unspecified retinal vascular occlusion: Secondary | ICD-10-CM | POA: Diagnosis not present

## 2019-12-24 DIAGNOSIS — E119 Type 2 diabetes mellitus without complications: Secondary | ICD-10-CM | POA: Diagnosis not present

## 2019-12-24 LAB — HM DIABETES EYE EXAM

## 2019-12-25 ENCOUNTER — Encounter: Payer: Self-pay | Admitting: Family Medicine

## 2019-12-28 ENCOUNTER — Encounter: Payer: Self-pay | Admitting: Family Medicine

## 2019-12-28 ENCOUNTER — Other Ambulatory Visit: Payer: Self-pay

## 2019-12-28 ENCOUNTER — Ambulatory Visit (INDEPENDENT_AMBULATORY_CARE_PROVIDER_SITE_OTHER): Payer: Medicare HMO | Admitting: Family Medicine

## 2019-12-28 VITALS — BP 110/68 | HR 78 | Temp 97.9°F | Ht 58.75 in | Wt 132.3 lb

## 2019-12-28 DIAGNOSIS — E1165 Type 2 diabetes mellitus with hyperglycemia: Secondary | ICD-10-CM

## 2019-12-28 DIAGNOSIS — E538 Deficiency of other specified B group vitamins: Secondary | ICD-10-CM

## 2019-12-28 DIAGNOSIS — F0391 Unspecified dementia with behavioral disturbance: Secondary | ICD-10-CM

## 2019-12-28 DIAGNOSIS — E118 Type 2 diabetes mellitus with unspecified complications: Secondary | ICD-10-CM | POA: Diagnosis not present

## 2019-12-28 DIAGNOSIS — F03918 Unspecified dementia, unspecified severity, with other behavioral disturbance: Secondary | ICD-10-CM

## 2019-12-28 DIAGNOSIS — F05 Delirium due to known physiological condition: Secondary | ICD-10-CM

## 2019-12-28 DIAGNOSIS — K861 Other chronic pancreatitis: Secondary | ICD-10-CM

## 2019-12-28 DIAGNOSIS — IMO0002 Reserved for concepts with insufficient information to code with codable children: Secondary | ICD-10-CM

## 2019-12-28 MED ORDER — CYANOCOBALAMIN 1000 MCG/ML IJ SOLN
1000.0000 ug | Freq: Once | INTRAMUSCULAR | Status: AC
  Administered 2019-12-28: 1000 ug via INTRAMUSCULAR

## 2019-12-28 MED ORDER — OZEMPIC (0.25 OR 0.5 MG/DOSE) 2 MG/1.5ML ~~LOC~~ SOPN: PEN_INJECTOR | SUBCUTANEOUS | 3 refills | Status: DC

## 2019-12-28 NOTE — Progress Notes (Signed)
This visit was conducted in person.  BP 110/68 (BP Location: Left Arm, Patient Position: Sitting, Cuff Size: Normal)   Pulse 78   Temp 97.9 F (36.6 C) (Temporal)   Ht 4' 10.75" (1.492 m)   Wt 132 lb 5 oz (60 kg)   SpO2 93%   BMI 26.95 kg/m    CC: DM Subjective:    Patient ID: Meredith Gutierrez, female    DOB: July 08, 1940, 79 y.o.   MRN: [id number]  HPI: Meredith Gutierrez is a 79 y.o. female presenting on 12/28/2019 for Diabetes (Here to disucss new medication plan.  Pt accompanied by daughter, [name]- temp 98.0.)   Planning to get covid vaccine soon.  Sudden worsening of glycemic control over the last few months. No known trigger, no signs of infection. GI did not think pancreas insufficiency causing acute decompensation in glycemic control.   Noted pedal edema this morning - planning to treat with lasix.  She has been taking b12, D and C vitamins regularly.   DM - does regularly check sugars see recent mychart message - fasting 200-300, dinner 300-390s. More recently cbgs 250s. Has had closer adherence to diabetic diet with some improvement noted in glycemic control, however sugars remain too high. Compliant with antihyperglycemic regimen which includes: invokana 329m daily, glipizide 573mBID with meals, metformin XR 100067mightly. Denies low sugars or hypoglycemic symptoms. Denies paresthesias. Last diabetic eye exam 12/2019. Pneumovax: 12/2018. Prevnar: 2016. Glucometer brand: accuchek aviva. Daughter with h/o papillary thyroid carcinoma. DSME: has not completed. They are interested. Also interested in CGM - requests Rx.  Lab Results  Component Value Date   HGBA1C 9.7 (H) 12/04/2019   Diabetic Foot Exam - Simple   No data filed     Lab Results  Component Value Date   MICROALBUR 2.8 (H) 08/10/2019       Relevant past medical, surgical, family and social history reviewed and updated as indicated. Interim medical history since our last visit reviewed. Allergies and medications  reviewed and updated. Outpatient Medications Prior to Visit  Medication Sig Dispense Refill  . ACCU-CHEK FASTCLIX LANCETS MISC 1 each by Does not apply route daily. Use as directed to check blood sugar once daily.  Dx code:  E11.65 100 each 0  . albuterol (PROVENTIL) (2.5 MG/3ML) 0.083% nebulizer solution Take 3 mLs (2.5 mg total) by nebulization every 6 (six) hours as needed for wheezing or shortness of breath. 75 mL 1  . Alcohol Swabs (B-D SINGLE USE SWABS REGULAR) PADS 1 each by Does not apply route daily. Use as directed to check blood sugar once daily.  Dx code:  E11.65 100 each 0  . ascorbic acid (VITAMIN C) 500 MG tablet Take 500 mg by mouth daily.    . aMarland Kitchenpirin EC 81 MG tablet Take 81 mg by mouth daily.    . Blood Glucose Monitoring Suppl (ACCU-CHEK AVIVA PLUS) w/Device KIT 1 each by Does not apply route daily. Use as directed to check blood sugar once daily.  Dx code:  E11.65 1 kit 0  . canagliflozin (INVOKANA) 300 MG TABS tablet Take 1 tablet (300 mg total) by mouth daily before breakfast. 30 tablet 11  . candesartan (ATACAND) 8 MG tablet Take 1 tablet (8 mg total) by mouth daily. 90 tablet 1  . cholecalciferol (VITAMIN D3) 25 MCG (1000 UNIT) tablet Take 1,000 Units by mouth daily.    . Cyanocobalamin (B-12) 1000 MCG SUBL Place 1 tablet under the tongue daily.    .Marland Kitchen  furosemide (LASIX) 40 MG tablet Take 1 tablet (40 mg total) by mouth daily as needed. Take 120 mg (3 tablets) once daily for a weight of 135 pounds and over and take 80 mg (2 tablets) once daily for a weight of 134 and below. (Patient taking differently: Take 40 mg by mouth daily as needed for fluid. Take 120 mg (3 tablets) once daily for a weight of 135 pounds and over and take 80 mg (2 tablets) once daily for a weight of 134 and below.)    . glipiZIDE (GLUCOTROL) 5 MG tablet Take 1 tablet (5 mg total) by mouth 2 (two) times daily as needed (if cbg > 250, take with breakfast and dinner). 60 tablet 3  . glucose blood (ACCU-CHEK  AVIVA PLUS) test strip 1 each by Other route as needed for other. Use as instructed to check blood sugar once daily.  Dx code:  E11.65 100 each 0  . memantine (NAMENDA) 5 MG tablet Take 1 tablet (5 mg total) by mouth 2 (two) times daily. First 4 days take 1 tablet daily 60 tablet 3  . metFORMIN (GLUCOPHAGE-XR) 500 MG 24 hr tablet Take 2 tablets (1,000 mg total) by mouth daily with breakfast. 180 tablet 2  . morphine (MS CONTIN) 15 MG 12 hr tablet Take 1 tablet (15 mg total) by mouth in the morning. Per pain clinic    . morphine (MSIR) 15 MG tablet Take 15 mg by mouth every 6 (six) hours as needed for severe pain.    Marland Kitchen nystatin cream (MYCOSTATIN) Apply 1 application topically 2 (two) times daily. 45 g 1  . QUEtiapine (SEROQUEL) 25 MG tablet Take 2 tablets (50 mg total) by mouth at bedtime. (Patient taking differently: Take 25-50 mg by mouth at bedtime. ) 60 tablet 11   No facility-administered medications prior to visit.     Per HPI unless specifically indicated in ROS section below Review of Systems Objective:  BP 110/68 (BP Location: Left Arm, Patient Position: Sitting, Cuff Size: Normal)   Pulse 78   Temp 97.9 F (36.6 C) (Temporal)   Ht 4' 10.75" (1.492 m)   Wt 132 lb 5 oz (60 kg)   SpO2 93%   BMI 26.95 kg/m   Wt Readings from Last 3 Encounters:  12/28/19 132 lb 5 oz (60 kg)  12/04/19 129 lb 3 oz (58.6 kg)  12/03/19 129 lb 8 oz (58.7 kg)      Physical Exam Vitals and nursing note reviewed.  Constitutional:      General: She is not in acute distress.    Appearance: Normal appearance. She is well-developed. She is not Gutierrez-appearing.  Eyes:     General: No scleral icterus.    Extraocular Movements: Extraocular movements intact.     Conjunctiva/sclera: Conjunctivae normal.     Pupils: Pupils are equal, round, and reactive to light.  Cardiovascular:     Rate and Rhythm: Normal rate and regular rhythm.     Pulses: Normal pulses.     Heart sounds: Normal heart sounds. No murmur  heard.      Comments: Murmur not appreciated today  Pulmonary:     Effort: Pulmonary effort is normal. No respiratory distress.     Breath sounds: Normal breath sounds. No wheezing, rhonchi or rales.  Musculoskeletal:     Cervical back: Normal range of motion and neck supple.     Right lower leg: Edema (tr) present.     Left lower leg: Edema (tr) present.  Comments: See HPI for foot exam if done  Lymphadenopathy:     Cervical: No cervical adenopathy.  Skin:    General: Skin is warm and dry.     Findings: No rash.  Neurological:     Mental Status: She is alert.       Results for orders placed or performed in visit on 12/25/19  HM DIABETES EYE EXAM  Result Value Ref Range   HM Diabetic Eye Exam No Retinopathy No Retinopathy   Lab Results  Component Value Date   HGBA1C 9.7 (H) 12/04/2019    Lab Results  Component Value Date   VITAMINB12 236 08/10/2019    Lab Results  Component Value Date   CHOL 231 (H) 08/10/2019   HDL 34.80 (L) 08/10/2019   LDLCALC 123 (H) 04/23/2019   LDLDIRECT 161.0 08/10/2019   TRIG 202.0 (H) 08/10/2019   CHOLHDL 7 08/10/2019    Assessment & Plan:  This visit occurred during the SARS-CoV-2 public health emergency.  Safety protocols were in place, including screening questions prior to the visit, additional usage of staff PPE, and extensive cleaning of exam room while observing appropriate contact time as indicated for disinfecting solutions.   Problem List Items Addressed This Visit    Sundowning    Stable period on nightly seroquel.       Low serum vitamin B12    Regular with b12 oral replacement - will provide with B12 shot today (energy levels did seem to improve temporarily after first B12 shot 08/2019).       Diabetes mellitus type 2, uncontrolled, with complications (HCC) - Primary    Chronic, some improvement but remains uncontrolled. Still unclear cause of acute decompensation over the past few months (glycemic control did seem to  deteriorate after COVID infection) but there has been some improvement in glycemic control with stricter low sugar diet. Discussed options - daily basal insulin vs trial weekly GLP1 RA. Daughter desires to try weekly injection - will start ozempic, reviewed titration schedule to 0.63m weekly over 2 weeks, reviewed side effects to watch for. Daughter with h/o papillary thyroid cancer, no MTC history. Patient with history of chronic pancreatitis - will need to monitor for exacerbation.       Relevant Medications   Semaglutide,0.25 or 0.5MG/DOS, (OZEMPIC, 0.25 OR 0.5 MG/DOSE,) 2 MG/1.5ML SOPN   Other Relevant Orders   Ambulatory referral to diabetic education   Dementia, old age, with behavioral disturbance ([place]   Chronic pancreatitis ([place]    Will need to monitor closely on GLP1 RA for worsening.          Meds ordered this encounter  Medications  . Semaglutide,0.25 or 0.5MG/DOS, (OZEMPIC, 0.25 OR 0.5 MG/DOSE,) 2 MG/1.5ML SOPN    Sig: Inject 0.1875 mLs (0.25 mg total) into the skin once a week for 14 days, THEN 0.375 mLs (0.5 mg total) once a week.    Dispense:  1 pen    Refill:  3  . cyanocobalamin ((VITAMIN B-12)) injection 1,000 mcg  . Continuous Blood Gluc Receiver (FREESTYLE LIBRE 14 DAY READER) DEVI    Sig: Use as directed to check sugars regularly    Dispense:  1 each    Refill:  0  . Continuous Blood Gluc Sensor (FREESTYLE LIBRE 14 DAY SENSOR) MISC    Sig: Use as directed to check sugars QID AC HS    Dispense:  2 each    Refill:  3  . Continuous Blood Gluc Sensor (FGage  MISC    Sig: Use as directed to check sugars QID AC HS    Dispense:  1 each    Refill:  0   Orders Placed This Encounter  Procedures  . Ambulatory referral to diabetic education    Referral Priority:   Routine    Referral Type:   Consultation    Referral Reason:   Specialty Services Required    Number of Visits Requested:   1    Patient Instructions  We will refer you to  diabetes classes. Trial ozempic 0.29m weekly for 2 weeks then increase to 0.565mweekly.  We will price out continuous glucose meter.  Return in 1 month for follow up visit.  If sugars start dropping on ozempic, start decreasing glipizide. If this doesn't help, we will discuss daily insulin injection.  B12 shot today.    Follow up plan: No follow-ups on file.  JaRia BushMD

## 2019-12-28 NOTE — Patient Instructions (Addendum)
We will refer you to diabetes classes. Trial ozempic 0.25mg  weekly for 2 weeks then increase to 0.5mg  weekly.  We will price out continuous glucose meter.  Return in 1 month for follow up visit.  If sugars start dropping on ozempic, start decreasing glipizide. If this doesn't help, we will discuss daily insulin injection.  B12 shot today.

## 2019-12-29 MED ORDER — FREESTYLE LIBRE 14 DAY READER DEVI: 0 refills | Status: DC

## 2019-12-29 MED ORDER — FREESTYLE LIBRE SENSOR SYSTEM MISC: 0 refills | Status: DC

## 2019-12-29 MED ORDER — FREESTYLE LIBRE 14 DAY SENSOR MISC: 3 refills | Status: DC

## 2019-12-29 NOTE — Assessment & Plan Note (Signed)
Will need to monitor closely on GLP1 RA for worsening.

## 2019-12-29 NOTE — Assessment & Plan Note (Signed)
Regular with b12 oral replacement - will provide with B12 shot today (energy levels did seem to improve temporarily after first B12 shot 08/2019).

## 2019-12-29 NOTE — Assessment & Plan Note (Addendum)
Chronic, some improvement but remains uncontrolled. Still unclear cause of acute decompensation over the past few months (glycemic control did seem to deteriorate after COVID infection) but there has been some improvement in glycemic control with stricter low sugar diet. Discussed options - daily basal insulin vs trial weekly GLP1 RA. Daughter desires to try weekly injection - will start ozempic, reviewed titration schedule to 0.5mg  weekly over 2 weeks, reviewed side effects to watch for. Daughter with h/o papillary thyroid cancer, no MTC history. Patient with history of chronic pancreatitis - will need to monitor for exacerbation.

## 2019-12-29 NOTE — Assessment & Plan Note (Signed)
Stable period on nightly seroquel.

## 2019-12-31 ENCOUNTER — Encounter: Payer: Self-pay | Admitting: Family Medicine

## 2019-12-31 DIAGNOSIS — IMO0002 Reserved for concepts with insufficient information to code with codable children: Secondary | ICD-10-CM

## 2019-12-31 DIAGNOSIS — E1165 Type 2 diabetes mellitus with hyperglycemia: Secondary | ICD-10-CM

## 2020-01-02 ENCOUNTER — Ambulatory Visit: Payer: Medicare HMO | Admitting: Nutrition

## 2020-01-03 NOTE — Telephone Encounter (Signed)
Pt's daugher, [name], has called back asking what to do about the Ozempic rx. It is too expensive. Please advise.

## 2020-01-04 MED ORDER — PEN NEEDLES 33G X 4 MM MISC: 1 refills | Status: DC

## 2020-01-04 MED ORDER — LANTUS SOLOSTAR 100 UNIT/ML ~~LOC~~ SOPN
10.0000 [IU] | PEN_INJECTOR | Freq: Every day | SUBCUTANEOUS | 99 refills | Status: DC
Start: 2020-01-04 — End: 2020-03-18

## 2020-01-04 NOTE — Telephone Encounter (Signed)
Left message on vm for pt's daughter, [name], per dpr relaying Dr. Synthia Innocent message and notifying her Dr. Darnell Level also sent a MyChart message.

## 2020-01-04 NOTE — Telephone Encounter (Signed)
E-scribed pen needles. 

## 2020-01-04 NOTE — Telephone Encounter (Signed)
Plz notify pt - recommend we start basal insulin lantus 10u daily with slow titration by 2 units every 3 days if average fasting sugar staying >150.  Let me know once fasting sugars consistently <150.

## 2020-01-04 NOTE — Addendum Note (Signed)
Addended by: Brenton Grills on: 5/75/0518 33:58 AM   Modules accepted: Orders

## 2020-01-09 ENCOUNTER — Encounter: Payer: Self-pay | Admitting: Family Medicine

## 2020-01-10 MED ORDER — FLUCONAZOLE 150 MG PO TABS
150.0000 mg | ORAL_TABLET | Freq: Once | ORAL | 0 refills | Status: AC
Start: 2020-01-10 — End: 2020-01-10

## 2020-01-10 NOTE — Telephone Encounter (Signed)
Spoke with daughter.  Concern for yeast infection with red rash to groin.  cbg's running 250-350s Will Rx diflucan. Stop invokana.  Increase lantus to 15u daily for 3 days and then to 20u daily if cbg's staying above 250, otherwise return to slow titration of 2u Q3 days of average fasting cbg >150.

## 2020-01-14 ENCOUNTER — Other Ambulatory Visit: Payer: Self-pay | Admitting: *Deleted

## 2020-01-14 MED ORDER — CANDESARTAN CILEXETIL 8 MG PO TABS: 8.0000 mg | ORAL_TABLET | Freq: Every day | ORAL | 1 refills | Status: DC

## 2020-01-17 DIAGNOSIS — I10 Essential (primary) hypertension: Secondary | ICD-10-CM | POA: Diagnosis not present

## 2020-01-17 DIAGNOSIS — E139 Other specified diabetes mellitus without complications: Secondary | ICD-10-CM | POA: Diagnosis not present

## 2020-01-17 DIAGNOSIS — E1165 Type 2 diabetes mellitus with hyperglycemia: Secondary | ICD-10-CM | POA: Diagnosis not present

## 2020-01-18 DIAGNOSIS — E139 Other specified diabetes mellitus without complications: Secondary | ICD-10-CM | POA: Diagnosis not present

## 2020-01-21 ENCOUNTER — Telehealth: Payer: Self-pay | Admitting: Gastroenterology

## 2020-01-21 NOTE — Telephone Encounter (Signed)
Spoke with patient's daughter, advised that PCP will monitor and will contact us if they think patient needs further evaluation from GI. Advised that Dr. Havery Moros is out of the office this week as well, patient has an appt with PCP on 02/06/20. Advised daughter to call back with any questions or concerns.

## 2020-02-06 ENCOUNTER — Ambulatory Visit: Payer: Medicare HMO | Admitting: Family Medicine

## 2020-02-07 ENCOUNTER — Other Ambulatory Visit: Payer: Self-pay | Admitting: Cardiovascular Disease

## 2020-02-07 NOTE — Telephone Encounter (Signed)
Rx has been sent to the pharmacy electronically. ° °

## 2020-02-08 ENCOUNTER — Ambulatory Visit: Payer: Medicare HMO | Admitting: Family Medicine

## 2020-02-19 ENCOUNTER — Encounter: Payer: Self-pay | Admitting: Cardiovascular Disease

## 2020-02-19 ENCOUNTER — Other Ambulatory Visit: Payer: Self-pay

## 2020-02-19 ENCOUNTER — Ambulatory Visit (INDEPENDENT_AMBULATORY_CARE_PROVIDER_SITE_OTHER): Payer: Medicare HMO | Admitting: Cardiovascular Disease

## 2020-02-19 VITALS — BP 115/62 | HR 74 | Ht 60.0 in | Wt 143.8 lb

## 2020-02-19 DIAGNOSIS — I25810 Atherosclerosis of coronary artery bypass graft(s) without angina pectoris: Secondary | ICD-10-CM

## 2020-02-19 DIAGNOSIS — E782 Mixed hyperlipidemia: Secondary | ICD-10-CM | POA: Diagnosis not present

## 2020-02-19 DIAGNOSIS — I5032 Chronic diastolic (congestive) heart failure: Secondary | ICD-10-CM

## 2020-02-19 DIAGNOSIS — E118 Type 2 diabetes mellitus with unspecified complications: Secondary | ICD-10-CM | POA: Diagnosis not present

## 2020-02-19 DIAGNOSIS — I10 Essential (primary) hypertension: Secondary | ICD-10-CM

## 2020-02-19 DIAGNOSIS — IMO0002 Reserved for concepts with insufficient information to code with codable children: Secondary | ICD-10-CM

## 2020-02-19 DIAGNOSIS — Z86718 Personal history of other venous thrombosis and embolism: Secondary | ICD-10-CM | POA: Diagnosis not present

## 2020-02-19 DIAGNOSIS — I773 Arterial fibromuscular dysplasia: Secondary | ICD-10-CM | POA: Diagnosis not present

## 2020-02-19 DIAGNOSIS — E1165 Type 2 diabetes mellitus with hyperglycemia: Secondary | ICD-10-CM

## 2020-02-19 DIAGNOSIS — I700 Atherosclerosis of aorta: Secondary | ICD-10-CM | POA: Diagnosis not present

## 2020-02-19 DIAGNOSIS — F0391 Unspecified dementia with behavioral disturbance: Secondary | ICD-10-CM

## 2020-02-19 DIAGNOSIS — F03918 Unspecified dementia, unspecified severity, with other behavioral disturbance: Secondary | ICD-10-CM

## 2020-02-19 NOTE — Patient Instructions (Addendum)
Medication Instructions:  No changes *If you need a refill on your cardiac medications before your next appointment, please call your pharmacy*   Lab Work: None ordered If you have labs (blood work) drawn today and your tests are completely normal, you will receive your results only by: Marland Kitchen MyChart Message (if you have MyChart) OR . A paper copy in the mail If you have any lab test that is abnormal or we need to change your treatment, we will call you to review the results.   Testing/Procedures: None ordered   Follow-Up: At [facility], you and your health needs are our priority.  As part of our continuing mission to provide you with exceptional heart care, we have created designated Provider Care Teams.  These Care Teams include your primary Cardiologist (physician) and Advanced Practice Providers (APPs -  Physician Assistants and Nurse Practitioners) who all work together to provide you with the care you need, when you need it.  We recommend signing up for the patient portal called "MyChart".  Sign up information is provided on this After Visit Summary.  MyChart is used to connect with patients for Virtual Visits (Telemedicine).  Patients are able to view lab/test results, encounter notes, upcoming appointments, etc.  Non-urgent messages can be sent to your provider as well.   To learn more about what you can do with MyChart, go to [email or web address].    Your next appointment:   6 month(s)  The format for your next appointment:   In Person  Provider:   You may see Sanda Klein, MD or one of the following Advanced Practice Providers on your designated Care Team:    Almyra Deforest, PA-C  Fabian Sharp, [place] or   Roby Lofts, [place]   We will make your dry weight 140 pounds.

## 2020-02-19 NOTE — Progress Notes (Signed)
Cardiololgy Office Note   Date:  02/24/2020   ID:  TINYA CADOGAN, DOB 05/13/1941, MRN [id number]   PCP:  Ria Bush, MD  Cardiologist:  Sanda Klein, MD  Electrophysiologist:  None   Evaluation Performed:  Follow-Up Visit  Chief Complaint: CHF, CAD, hypertension  History of Present Illness:    Meredith Gutierrez is a 79 y.o. female with longstanding systemic hypertension,  CAD S/P CABG (2003 LIMA to LAD and SVG to first diagonal, ligation of large LAD artery aneurysm, atretic LIMA with patent LAD at cardiac cath in 2013), without coronary events since that time. She has hyperdynamic left ventricular systolic function, moderate LVH, chronic diastolic heart failure, right renal artery stenosis, hyperlipidemia intolerant to numerous attempts at lipid lowering therapy (rosuvastatin, simvastatin, Zetia and others). She is unable to take beta blockers due to bradycardia.   She has had slowly progressive dementia.  She is accompanied by her daughter and medical power of attorney, [name].  She is doing well today and her edema is controlled.  Her "dry weight" seems to be around 140 pounds on her home scale.  Her appetite has improved and she has gained back a little weight.  She continues to require insulin for her diabetes mellitus, although the dose is decreasing.  She has tried to take SGLT2 inhibitors (Invokana) but had recurrent UTIs and had to stop it).  She denies angina, dyspnea, palpitations, dizziness, syncope, intermittent claudication or new focal neurological complaints.  Danessa had COVID-19 infection in January.  She developed a left popliteal DVT and was started on Xarelto on January 13, now off anticoagulation and back on aspirin.     Past Medical History:  Diagnosis Date  . Angina    never needed to take nitroglycerin.  . Anxiety   . Blood transfusion without reported diagnosis   . Cerebral atherosclerosis   . Community acquired pneumonia of left lower  lobe of lung 12/20/2018  . COPD (chronic obstructive pulmonary disease) ([place])    02-16-13-"pt. denies this"-shows on CXR of 2- 2013.  Marland Kitchen Coronary artery disease 2003   s/p CABG 2014  . COVID-19 virus infection 06/2019   Covid-19 PNA  . Dementia ([place])   . Diabetes mellitus without complication ([place]) 44/0347   NEW ONSET  . Diastolic CHF, on admit treated. 08/10/2011  . Family history of adverse reaction to anesthesia    Dad and Sister had trouble waking up  . Fibromuscular dysplasia of renal artery (HCC)    right  . Heart murmur   . History of kidney stones 02-16-13   "hx. of overgrowth of muscle-causing some stricture"renal artery stenosis-being followed [place]  . Hyperlipidemia   . Hypertension   . Hyperthyroidism    [place]  . Hyponatremia 04/2018  . Melanoma ([place]) 2010   skin cancers, 1 melanoma removed back  . Osteoarthritis   . Pneumonia due to COVID-19 virus 06/26/2019   Past Surgical History:  Procedure Laterality Date  . ABDOMINAL HYSTERECTOMY  1970   heavy bleeding  . APPENDECTOMY  1960s  . CARDIAC CATHETERIZATION  08/09/2011   no sign. coronary obstruction. LIMA is atretic w/excellent flow down the native LAD  . CARDIAC CATHETERIZATION  08/22/2001   no sign. coronary stenosis,  . CATARACT EXTRACTION, BILATERAL Bilateral   . CESAREAN SECTION    . CORONARY ARTERY BYPASS GRAFT  02-16-13   3'03-no bypasses -Had 2 aneurysm repairs(stents used)  . ESOPHAGOGASTRODUODENOSCOPY  04/2018   LA Grade A esophagitis, small nodule distal  esophagus (benign schwann cell hamartoma), empiric dilation to 57m ([place])  . LAPAROTOMY N/A 02/20/2013   Procedure: EXPLORATORY LAPAROTOMY;  WJanie Morning MD  . LEFT HEART CATHETERIZATION WITH CORONARY ANGIOGRAM N/A 08/09/2011   Procedure: LEFT HEART CATHETERIZATION WITH CORONARY ANGIOGRAM;  Surgeon: MSanda Klein MD;  [facility];  Service: Cardiovascular;  Laterality: N/A;  . NM MYOCAR PERF WALL MOTION  07/20/2010   normal  . RADIOLOGY  WITH ANESTHESIA N/A 02/22/2019   Procedure: MRI OF ABDOMEN WITH AND WITHOUT CONTRAST;  Surgeon: Radiologist, Medication, MD;  [facility]  Service: Radiology;  Laterality: N/A;  . RADIOLOGY WITH ANESTHESIA Bilateral 09/04/2019   Procedure: MRI WITH ANESTHESIA OF ABDOMEN WITH AND WITHOUT CONTRAST, BRAIN WITHOUT CONTRAST;  Surgeon: Radiologist, Medication, MD;  [facility]  Service: Radiology;  Laterality: Bilateral;  . SALPINGOOPHORECTOMY Bilateral 02/20/2013   benign seromucinous cystadenofibroma R ovary ([name] MD)  . TOOTH EXTRACTION    . TOTAL HIP ARTHROPLASTY Left 2015     Current Meds  Medication Sig  . ACCU-CHEK FASTCLIX LANCETS MISC 1 each by Does not apply route daily. Use as directed to check blood sugar once daily.  Dx code:  E11.65  . albuterol (PROVENTIL) (2.5 MG/3ML) 0.083% nebulizer solution Take 3 mLs (2.5 mg total) by nebulization every 6 (six) hours as needed for wheezing or shortness of breath.  . Alcohol Swabs (B-D SINGLE USE SWABS REGULAR) PADS 1 each by Does not apply route daily. Use as directed to check blood sugar once daily.  Dx code:  E11.65  . ascorbic acid (VITAMIN C) 500 MG tablet Take 500 mg by mouth daily.  .Marland Kitchenaspirin EC 81 MG tablet Take 81 mg by mouth daily.  . Blood Glucose Monitoring Suppl (ACCU-CHEK AVIVA PLUS) w/Device KIT 1 each by Does not apply route daily. Use as directed to check blood sugar once daily.  Dx code:  E11.65  . candesartan (ATACAND) 8 MG tablet Take 1 tablet (8 mg total) by mouth daily.  . cholecalciferol (VITAMIN D3) 25 MCG (1000 UNIT) tablet Take 1,000 Units by mouth daily.  . Continuous Blood Gluc Receiver (FREESTYLE LIBRE 14 DAY READER) DEVI Use as directed to check sugars regularly  . Continuous Blood Gluc Sensor (FREESTYLE LIBRE 14 DAY SENSOR) MISC Use as directed to check sugars QID AC HS  . Continuous Blood Gluc Sensor ([place] MISC Use as directed to check sugars QID AC HS  . Cyanocobalamin  (B-12) 1000 MCG SUBL Place 1 tablet under the tongue daily.  . furosemide (LASIX) 40 MG tablet Take 1 tablet (40 mg total) by mouth daily as needed. Take 120 mg (3 tablets) once daily for a weight of 135 pounds and over and take 80 mg (2 tablets) once daily for a weight of 134 and below. (Patient taking differently: Take 40 mg by mouth daily as needed for fluid. Take 120 mg (3 tablets) once daily for a weight of 135 pounds and over and take 80 mg (2 tablets) once daily for a weight of 134 and below.)  . furosemide (LASIX) 80 MG tablet TAKE 1 TO 2 TABLETS BY MOUTH DAILY AS DIRECTED  . glucose blood (ACCU-CHEK AVIVA PLUS) test strip 1 each by Other route as needed for other. Use as instructed to check blood sugar once daily.  Dx code:  E11.65  . insulin glargine (LANTUS SOLOSTAR) 100 UNIT/ML Solostar Pen Inject 10 Units into the skin daily. Planned titration  . Insulin Pen Needle (PEN  NEEDLES) 33G X 4 MM MISC Use as instructed to administer insulin daily.  . memantine (NAMENDA) 5 MG tablet Take 1 tablet (5 mg total) by mouth 2 (two) times daily. First 4 days take 1 tablet daily  . metFORMIN (GLUCOPHAGE-XR) 500 MG 24 hr tablet Take 2 tablets (1,000 mg total) by mouth daily with breakfast.  . morphine (MS CONTIN) 15 MG 12 hr tablet Take 1 tablet (15 mg total) by mouth in the morning. Per pain clinic  . morphine (MSIR) 15 MG tablet Take 15 mg by mouth every 6 (six) hours as needed for severe pain.  Marland Kitchen nystatin cream (MYCOSTATIN) Apply 1 application topically 2 (two) times daily.  . QUEtiapine (SEROQUEL) 25 MG tablet Take 2 tablets (50 mg total) by mouth at bedtime. (Patient taking differently: Take 25-50 mg by mouth at bedtime. )     Allergies:   Codeine, Prednisolone acetate, Prednisone, Nitroglycerin, Metolazone, Adhesive [tape], Crestor [rosuvastatin calcium], Lopressor [metoprolol tartrate], Mupirocin, Sulfamethoxazole, and Zetia [ezetimibe]   Social History   Tobacco Use  . Smoking status: Former  Smoker    Packs/day: 0.10    Years: 20.00    Pack years: 2.00    Types: Cigarettes    Quit date: 09/14/2017    Years since quitting: 2.4  . Smokeless tobacco: Never Used  . Tobacco comment: smoking cessation info given  Vaping Use  . Vaping Use: Never used  Substance Use Topics  . Alcohol use: Yes    Comment: occasional  . Drug use: No     Family Hx: The patient's family history includes Breast cancer in her maternal aunt; Colon polyps in her maternal grandfather; Coronary artery disease in her father; Dementia (age of onset: 79) in her mother; Diabetes in her brother; Hypertension in her brother and father; Hypotension in her mother and sister; Lung cancer in her daughter; Stroke in her mother; Thyroid cancer in her daughter. There is no history of Colon cancer, Esophageal cancer, Stomach cancer, or Rectal cancer.  ROS:   Please see the history of present illness.     All other systems reviewed and are negative.  Prior CV studies:   The following studies were reviewed today:  Venous duplex ultrasound June 26, 2019, renal duplex ultrasound November 2019  echo December 21, 2018 1. The left ventricle has normal systolic function with an ejection  fraction of 60-65%. The cavity size was normal. There is mild concentric  left ventricular hypertrophy. Left ventricular diastolic Doppler  parameters are consistent with impaired  relaxation. Elevated left ventricular end-diastolic pressure The E/e' is  21. No evidence of left ventricular regional wall motion abnormalities.  2. The right ventricle has normal systolic function. The cavity was  normal. There is no increase in right ventricular wall thickness. Right  ventricular systolic pressure is normal with an estimated pressure of 28.1  mmHg.  3. Left atrial size was moderately dilated.  4. Right atrial size was moderately dilated.  5. Mild thickening of the mitral valve leaflet. Mild calcification of the  mitral valve leaflet.  There is mild mitral annular calcification present.  No evidence of mitral valve stenosis.  6. Moderate thickening of the aortic valve. Moderate calcification of the  aortic valve. No stenosis of the aortic valve.  7. The aortic root is normal in size and structure.  8. There is mild dilatation of the ascending aorta measuring 41 mm.  9. The inferior vena cava was dilated in size with >50% respiratory  variability.  Labs/Other Tests and Data Reviewed:    EKG: Ordered today, shows normal sinus rhythm with a single PAC, poor R wave progression, no ischemic changes, QTC 439 ms.  Recent Labs: 04/23/2019: TSH 1.25 06/26/2019: B Natriuretic Peptide 98.8 12/01/2019: ALT 17; BUN 20; Creatinine, Ser 0.84; Hemoglobin 14.3; Platelets 286; Potassium 4.5; Sodium 136   Recent Lipid Panel Lab Results  Component Value Date/Time   CHOL 231 (H) 08/10/2019 08:22 AM   TRIG 202.0 (H) 08/10/2019 08:22 AM   HDL 34.80 (L) 08/10/2019 08:22 AM   CHOLHDL 7 08/10/2019 08:22 AM   LDLCALC 123 (H) 04/23/2019 07:46 AM   LDLDIRECT 161.0 08/10/2019 08:22 AM    Wt Readings from Last 3 Encounters:  02/19/20 143 lb 12.8 oz (65.2 kg)  12/28/19 132 lb 5 oz (60 kg)  12/04/19 129 lb 3 oz (58.6 kg)     Objective:    Vital Signs:  BP 115/62   Pulse 74   Ht 5' (1.524 m)   Wt 143 lb 12.8 oz (65.2 kg)   SpO2 98%   BMI 28.08 kg/m     General: Alert, no distress Head: no evidence of trauma, PERRL, EOMI, no exophtalmos or lid lag, no myxedema, no xanthelasma; normal ears, nose and oropharynx Neck: normal jugular venous pulsations and no hepatojugular reflux; brisk carotid pulses without delay and no carotid bruits Chest: clear to auscultation, no signs of consolidation by percussion or palpation, normal fremitus, symmetrical and full respiratory excursions Cardiovascular: normal position and quality of the apical impulse, regular rhythm, normal first and second heart sounds, no murmurs, rubs or gallops Abdomen:  no tenderness or distention, no masses by palpation, no abnormal pulsatility or arterial bruits, normal bowel sounds, no hepatosplenomegaly Extremities: no clubbing, cyanosis or edema; 2+ radial, ulnar and brachial pulses bilaterally; 2+ right femoral, posterior tibial and dorsalis pedis pulses; 2+ left femoral, posterior tibial and dorsalis pedis pulses; no subclavian or femoral bruits Neurological: grossly nonfocal Psych: Normal mood and affect.  She does not really participate in the conversation unless repeatedly prompted to do so.  Appears mildly disoriented and clearly has short-term memory problems.   ASSESSMENT & PLAN:    1. Essential hypertension   2. Chronic diastolic (congestive) heart failure ([place])   3. Diabetes mellitus type 2, uncontrolled, with complications ([place])   4. Coronary artery disease involving coronary bypass graft of native heart without angina pectoris   5. Mixed hyperlipidemia   6. History of deep vein thrombosis (DVT) of lower extremity   7. Fibromuscular dysplasia of renal artery (HCC)   8. Atherosclerosis of aorta ([place])   9. Dementia, old age, with behavioral disturbance ([place])      1. CHF: Preserved left ventricular systolic function.  Currently appears to be euvolemic.  We will set a target "dry weight" of 140 pounds.  May need to readjust as she loses real weight.  Her daughter is doing an excellent job of monitoring for excessive salt in her diet and adjusting her diuretic. 2. HTN: Blood pressure is now well controlled. 3. DM: Glycemic control has deteriorated, most recent hemoglobin A1c 9.7%.  Reviewed the fact that high glucose level have a diuretic effect.  She did not tolerate SGLT2 inhibitors due to urinary infections. 4. CAD: She does not have angina.  She has not tolerated any lipid lowering agents. 5. HLP: Numerous attempts at treatment with statins and ezetimibe have failed due to side effects.  Considering all her comorbid conditions, I am not sure  that  PCSK9 inhibitors are indicated.  Her daughter reports that she does very poorly with shots.  We will treat conservatively with dietary restrictions. 6. DVT: Popliteal DVT in the setting of acute medical illness, anticoagulation has been stopped. 7. Rt RAS: Blood pressure is well controlled and most recent creatinine was 0.84.  Renal artery stenosis was confirmed by duplex ultrasound in 2019.  Interestingly in the past she had left renal artery stenosis.  The diagnosis was always presumed to be fibromuscular dysplasia.  As long as we can control her blood pressure and her renal function remained stable, no reason to consider angioplasty. 8. Ao Atheroscl: Seen on a variety of imaging studies.  Unfortunately intolerant to oral lipid-lowering agents. 9. Dementia: It appears appropriate to avoid aggressive or invasive medical procedures. Patient Instructions  Medication Instructions:  No changes *If you need a refill on your cardiac medications before your next appointment, please call your pharmacy*   Lab Work: None ordered If you have labs (blood work) drawn today and your tests are completely normal, you will receive your results only by: Marland Kitchen MyChart Message (if you have MyChart) OR . A paper copy in the mail If you have any lab test that is abnormal or we need to change your treatment, we will call you to review the results.   Testing/Procedures: None ordered   Follow-Up: At [facility], you and your health needs are our priority.  As part of our continuing mission to provide you with exceptional heart care, we have created designated Provider Care Teams.  These Care Teams include your primary Cardiologist (physician) and Advanced Practice Providers (APPs -  Physician Assistants and Nurse Practitioners) who all work together to provide you with the care you need, when you need it.  We recommend signing up for the patient portal called "MyChart".  Sign up information is provided on this  After Visit Summary.  MyChart is used to connect with patients for Virtual Visits (Telemedicine).  Patients are able to view lab/test results, encounter notes, upcoming appointments, etc.  Non-urgent messages can be sent to your provider as well.   To learn more about what you can do with MyChart, go to [email or web address].    Your next appointment:   6 month(s)  The format for your next appointment:   In Person  Provider:   You may see Sanda Klein, MD or one of the following Advanced Practice Providers on your designated Care Team:    Almyra Deforest, PA-C  Fabian Sharp, [place] or   Roby Lofts, [place]   We will make your dry weight 140 pounds.  Tests Ordered: Orders Placed This Encounter  Procedures  . EKG 12-Lead    Medication Changes: No orders of the defined types were placed in this encounter.   Follow Up:  Either In Person or Virtual in 6 month(s)  Signed, Sanda Klein, MD  02/24/2020 10:08 AM    [place]

## 2020-02-24 ENCOUNTER — Encounter: Payer: Self-pay | Admitting: Cardiovascular Disease

## 2020-02-28 DIAGNOSIS — E139 Other specified diabetes mellitus without complications: Secondary | ICD-10-CM | POA: Diagnosis not present

## 2020-03-18 ENCOUNTER — Encounter: Payer: Self-pay | Admitting: Family Medicine

## 2020-03-18 ENCOUNTER — Other Ambulatory Visit: Payer: Self-pay

## 2020-03-18 ENCOUNTER — Ambulatory Visit (INDEPENDENT_AMBULATORY_CARE_PROVIDER_SITE_OTHER): Payer: Medicare HMO | Admitting: Family Medicine

## 2020-03-18 VITALS — BP 120/72 | HR 69 | Temp 98.0°F | Ht 60.0 in | Wt 145.2 lb

## 2020-03-18 DIAGNOSIS — I5032 Chronic diastolic (congestive) heart failure: Secondary | ICD-10-CM

## 2020-03-18 DIAGNOSIS — Z23 Encounter for immunization: Secondary | ICD-10-CM | POA: Diagnosis not present

## 2020-03-18 DIAGNOSIS — E1165 Type 2 diabetes mellitus with hyperglycemia: Secondary | ICD-10-CM

## 2020-03-18 DIAGNOSIS — IMO0002 Reserved for concepts with insufficient information to code with codable children: Secondary | ICD-10-CM

## 2020-03-18 DIAGNOSIS — R600 Localized edema: Secondary | ICD-10-CM | POA: Diagnosis not present

## 2020-03-18 DIAGNOSIS — E118 Type 2 diabetes mellitus with unspecified complications: Secondary | ICD-10-CM

## 2020-03-18 NOTE — Patient Instructions (Addendum)
Flu shot today  Keep legs elevated.  Try compression stockings (Rx provided today) Take lasix 80mg  daily for 3 days in a row to get some fluid off. Look at sodium levels in the food.   Edema  Edema is an abnormal buildup of fluids in the body tissues and under the skin. Swelling of the legs, feet, and ankles is a common symptom that becomes more likely as you get older. Swelling is also common in looser tissues, like around the eyes. When the affected area is squeezed, the fluid may move out of that spot and leave a dent for a few moments. This dent is called pitting edema. There are many possible causes of edema. Eating too much salt (sodium) and being on your feet or sitting for a long time can cause edema in your legs, feet, and ankles. Hot weather may make edema worse. Common causes of edema include:  Heart failure.  Liver or kidney disease.  Weak leg blood vessels.  Cancer.  An injury.  Pregnancy.  Medicines.  Being obese.  Low protein levels in the blood. Edema is usually painless. Your skin may look swollen or shiny. Follow these instructions at home:  Keep the affected body part raised (elevated) above the level of your heart when you are sitting or lying down.  Do not sit still or stand for long periods of time.  Do not wear tight clothing. Do not wear garters on your upper legs.  Exercise your legs to get your circulation going. This helps to move the fluid back into your blood vessels, and it may help the swelling go down.  Wear elastic bandages or support stockings to reduce swelling as told by your health care provider.  Eat a low-salt (low-sodium) diet to reduce fluid as told by your health care provider.  Depending on the cause of your swelling, you may need to limit how much fluid you drink (fluid restriction).  Take over-the-counter and prescription medicines only as told by your health care provider. Contact a health care provider if:  Your edema does  not get better with treatment.  You have heart, liver, or kidney disease and have symptoms of edema.  You have sudden and unexplained weight gain. Get help right away if:  You develop shortness of breath or chest pain.  You cannot breathe when you lie down.  You develop pain, redness, or warmth in the swollen areas.  You have heart, liver, or kidney disease and suddenly get edema.  You have a fever and your symptoms suddenly get worse. Summary  Edema is an abnormal buildup of fluids in the body tissues and under the skin.  Eating too much salt (sodium) and being on your feet or sitting for a long time can cause edema in your legs, feet, and ankles.  Keep the affected body part raised (elevated) above the level of your heart when you are sitting or lying down. This information is not intended to replace advice given to you by your health care provider. Make sure you discuss any questions you have with your health care provider. Document Revised: 10/18/2018 Document Reviewed: 07/03/2016 Elsevier Patient Education  [place].

## 2020-03-18 NOTE — Assessment & Plan Note (Addendum)
Anticipate multifactorial including known CHF, recent increased sodium content food, recent prolonged drive. 5 lb weight gain from dry weight. Clinically no fluid in lungs or ascites. Not consistent with DVT or cellulitis. rec lasix 80mg  daily x 3 days then return to PRN. Also reviewed further conservative care - leg elevation, compression stocking use (Rx provided today), limiting sodium in diet, etc. Update if not improving with this.  Anticipate small shallow erosion to L anterior shin will continue healing well.

## 2020-03-18 NOTE — Assessment & Plan Note (Signed)
Appreciate endo care. Reviewed recent treatment regimen changes.

## 2020-03-18 NOTE — Progress Notes (Signed)
This visit was conducted in person.  BP 120/72 (BP Location: Left Arm, Patient Position: Sitting, Cuff Size: Normal)   Pulse 69   Temp 98 F (36.7 C) (Temporal)   Ht 5' (1.524 m)   Wt 145 lb 4 oz (65.9 kg)   SpO2 95%   BMI 28.37 kg/m    CC: lower leg pain  Subjective:    Patient ID: Meredith Gutierrez, female    DOB: [birth year], 79 y.o.   MRN: [id number]  HPI: Meredith Gutierrez is a 79 y.o. female presenting on 03/18/2020 for Leg Pain (C/o bilateral lower leg tenderness, redness, swelling and scaly.  Started about 1 wk ago. Cards has pt on Lasix.  Pt accompanied by daughter, [name]- temp 98.0.)   1 wk h/o bilateral lower leg and foot pain and swelling.  Known CHF on lasix 43m 1-2 tab daily.  Dry weight seems to be 140 lbs on home scale.  Denies increased salt in diet recently. She did have some pork and can of tomato soup recently. [place] [facility] [place] [facility]  Not using compression stockings - trouble tolerating.  No chest pain, dyspnea, cough.    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      Relevant past medical, surgical, family and social history reviewed and updated as indicated. Interim medical history since our last visit reviewed. Allergies and medications reviewed and updated. Outpatient Medications Prior to Visit  Medication Sig Dispense Refill  . ACCU-CHEK FASTCLIX LANCETS MISC 1 each by Does not apply route daily. Use as directed to check blood sugar once daily.  Dx code:  E11.65 100 each 0  . albuterol (PROVENTIL) (2.5 MG/3ML) 0.083% nebulizer solution Take 3 mLs (2.5 mg total) by nebulization every 6 (six) hours as needed for wheezing or shortness of breath. 75 mL 1  . Alcohol Swabs (B-D SINGLE USE SWABS REGULAR) PADS 1 each by Does not apply route daily. Use as directed to check blood sugar once daily.  Dx code:  E11.65 100 each 0  . ascorbic acid  (VITAMIN C) 500 MG tablet Take 500 mg by mouth daily.    . Marland Kitchenspirin EC 81 MG tablet Take 81 mg by mouth daily.    . Blood Glucose Monitoring Suppl (ACCU-CHEK AVIVA PLUS) w/Device KIT 1 each by Does not apply route daily. Use as directed to check blood sugar once daily.  Dx code:  E11.65 1 kit 0  . candesartan (ATACAND) 8 MG tablet Take 1 tablet (8 mg total) by mouth daily. 90 tablet 1  . cholecalciferol (VITAMIN D3) 25 MCG (1000 UNIT) tablet Take 1,000 Units by mouth daily.    . Continuous Blood Gluc Receiver (FREESTYLE LIBRE 14 DAY READER) DEVI Use as directed to check sugars regularly 1 each 0  . Continuous Blood Gluc Sensor (FREESTYLE LIBRE 14 DAY SENSOR) MISC Use as directed to check sugars QID AC HS 2 each 3  . Continuous Blood Gluc Sensor (FREESTYLE LIBRE SENSOR SYSTEM) MISC Use as directed to check sugars QID AC HS 1 each 0  . Cyanocobalamin (B-12) 1000 MCG SUBL Place 1 tablet under the tongue daily.    . furosemide (LASIX) 40 MG tablet Take 1 tablet (40 mg total) by mouth daily as needed. Take 120 mg (3 tablets) once daily for a weight of 135 pounds and over and  take 80 mg (2 tablets) once daily for a weight of 134 and below. (Patient taking differently: Take 40 mg by mouth daily as needed for fluid. Take 120 mg (3 tablets) once daily for a weight of 135 pounds and over and take 80 mg (2 tablets) once daily for a weight of 134 and below.)    . furosemide (LASIX) 80 MG tablet TAKE 1 TO 2 TABLETS BY MOUTH DAILY AS DIRECTED 180 tablet 1  . glucose blood (ACCU-CHEK AVIVA PLUS) test strip 1 each by Other route as needed for other. Use as instructed to check blood sugar once daily.  Dx code:  E11.65 100 each 0  . insulin glargine (LANTUS SOLOSTAR) 100 UNIT/ML Solostar Pen Take 10u in the morning, 25u with dinner    . Insulin Pen Needle (PEN NEEDLES) 33G X 4 MM MISC Use as instructed to administer insulin daily. 100 each 1  . memantine (NAMENDA) 5 MG tablet Take 1 tablet (5 mg total) by mouth 2 (two)  times daily. First 4 days take 1 tablet daily 60 tablet 3  . metFORMIN (GLUCOPHAGE-XR) 500 MG 24 hr tablet Take 2 tablets (1,000 mg total) by mouth daily with breakfast. 180 tablet 2  . morphine (MS CONTIN) 15 MG 12 hr tablet Take 1 tablet (15 mg total) by mouth in the morning. Per pain clinic    . morphine (MSIR) 15 MG tablet Take 15 mg by mouth every 6 (six) hours as needed for severe pain.    Marland Kitchen nystatin cream (MYCOSTATIN) Apply 1 application topically 2 (two) times daily. 45 g 1  . QUEtiapine (SEROQUEL) 25 MG tablet Take 2 tablets (50 mg total) by mouth at bedtime. (Patient taking differently: Take 25-50 mg by mouth at bedtime. ) 60 tablet 11  . insulin glargine (LANTUS SOLOSTAR) 100 UNIT/ML Solostar Pen Inject 10 Units into the skin daily. Planned titration 5 pen PRN   No facility-administered medications prior to visit.     Per HPI unless specifically indicated in ROS section below Review of Systems Objective:  BP 120/72 (BP Location: Left Arm, Patient Position: Sitting, Cuff Size: Normal)   Pulse 69   Temp 98 F (36.7 C) (Temporal)   Ht 5' (1.524 m)   Wt 145 lb 4 oz (65.9 kg)   SpO2 95%   BMI 28.37 kg/m   Wt Readings from Last 3 Encounters:  03/18/20 145 lb 4 oz (65.9 kg)  02/19/20 143 lb 12.8 oz (65.2 kg)  12/28/19 132 lb 5 oz (60 kg)      Physical Exam Vitals and nursing note reviewed.  Constitutional:      Appearance: Normal appearance. She is not Gutierrez-appearing.  Cardiovascular:     Rate and Rhythm: Normal rate and regular rhythm.     Pulses: Normal pulses.     Heart sounds: Normal heart sounds. No murmur heard.   Pulmonary:     Effort: Pulmonary effort is normal. No respiratory distress.     Breath sounds: Normal breath sounds. No wheezing, rhonchi or rales.  Musculoskeletal:        General: Tenderness (bilateral legs) present.     Right lower leg: Edema (1-2+) present.     Left lower leg: Edema (1-2+) present.  Skin:    General: Skin is warm and dry.      Findings: Lesion present. No erythema or rash.     Comments: Small shallow healing erosion with streaking redness or drainage  Neurological:     Mental Status:  She is alert.  Psychiatric:        Mood and Affect: Mood normal.        Behavior: Behavior normal.       Results for orders placed or performed in visit on 12/25/19  HM DIABETES EYE EXAM  Result Value Ref Range   HM Diabetic Eye Exam No Retinopathy No Retinopathy   Assessment & Plan:  This visit occurred during the SARS-CoV-2 public health emergency.  Safety protocols were in place, including screening questions prior to the visit, additional usage of staff PPE, and extensive cleaning of exam room while observing appropriate contact time as indicated for disinfecting solutions.   Problem List Items Addressed This Visit    Pedal edema - Primary    Anticipate multifactorial including known CHF, recent increased sodium content food, recent prolonged drive. 5 lb weight gain from dry weight. Clinically no fluid in lungs or ascites. Not consistent with DVT or cellulitis. rec lasix 36m daily x 3 days then return to PRN. Also reviewed further conservative care - leg elevation, compression stocking use (Rx provided today), limiting sodium in diet, etc. Update if not improving with this.  Anticipate small shallow erosion to L anterior shin will continue healing well.       Diabetes mellitus type 2, uncontrolled, with complications ([place]    Appreciate endo care. Reviewed recent treatment regimen changes.       Relevant Medications   insulin glargine (LANTUS SOLOSTAR) 100 UNIT/ML Solostar Pen   Chronic diastolic (congestive) heart failure (HCC) (Chronic)    Other Visit Diagnoses    Need for influenza vaccination       Relevant Orders   Flu Vaccine QUAD High Dose(Fluad) (Completed)       No orders of the defined types were placed in this encounter.  Orders Placed This Encounter  Procedures  . Flu Vaccine QUAD High Dose(Fluad)     Patient instructions: Flu shot today  Keep legs elevated.  Try compression stockings (Rx provided today) Take lasix 845mdaily for 3 days in a row to get some fluid off. Look at sodium levels in the food.   Follow up plan: Return if symptoms worsen or fail to improve.  JaRia BushMD

## 2020-03-26 DIAGNOSIS — M47816 Spondylosis without myelopathy or radiculopathy, lumbar region: Secondary | ICD-10-CM | POA: Diagnosis not present

## 2020-03-31 ENCOUNTER — Other Ambulatory Visit: Payer: Self-pay | Admitting: Family Medicine

## 2020-04-01 NOTE — Telephone Encounter (Signed)
Last OV 03/18/20 Last fill 09/04/19  #180/2

## 2020-04-04 DIAGNOSIS — E1165 Type 2 diabetes mellitus with hyperglycemia: Secondary | ICD-10-CM | POA: Diagnosis not present

## 2020-04-24 ENCOUNTER — Other Ambulatory Visit: Payer: Self-pay | Admitting: Family Medicine

## 2020-04-24 DIAGNOSIS — E1165 Type 2 diabetes mellitus with hyperglycemia: Secondary | ICD-10-CM

## 2020-04-24 DIAGNOSIS — E0590 Thyrotoxicosis, unspecified without thyrotoxic crisis or storm: Secondary | ICD-10-CM

## 2020-04-24 DIAGNOSIS — E1169 Type 2 diabetes mellitus with other specified complication: Secondary | ICD-10-CM

## 2020-04-24 DIAGNOSIS — E538 Deficiency of other specified B group vitamins: Secondary | ICD-10-CM

## 2020-04-24 DIAGNOSIS — E785 Hyperlipidemia, unspecified: Secondary | ICD-10-CM

## 2020-04-24 DIAGNOSIS — E559 Vitamin D deficiency, unspecified: Secondary | ICD-10-CM

## 2020-04-24 DIAGNOSIS — IMO0002 Reserved for concepts with insufficient information to code with codable children: Secondary | ICD-10-CM

## 2020-04-25 ENCOUNTER — Other Ambulatory Visit (INDEPENDENT_AMBULATORY_CARE_PROVIDER_SITE_OTHER): Payer: Medicare HMO

## 2020-04-25 ENCOUNTER — Other Ambulatory Visit: Payer: Self-pay

## 2020-04-25 DIAGNOSIS — E559 Vitamin D deficiency, unspecified: Secondary | ICD-10-CM

## 2020-04-25 DIAGNOSIS — E0590 Thyrotoxicosis, unspecified without thyrotoxic crisis or storm: Secondary | ICD-10-CM | POA: Diagnosis not present

## 2020-04-25 DIAGNOSIS — E538 Deficiency of other specified B group vitamins: Secondary | ICD-10-CM | POA: Diagnosis not present

## 2020-04-25 DIAGNOSIS — E118 Type 2 diabetes mellitus with unspecified complications: Secondary | ICD-10-CM | POA: Diagnosis not present

## 2020-04-25 DIAGNOSIS — E1165 Type 2 diabetes mellitus with hyperglycemia: Secondary | ICD-10-CM

## 2020-04-25 DIAGNOSIS — E1169 Type 2 diabetes mellitus with other specified complication: Secondary | ICD-10-CM | POA: Diagnosis not present

## 2020-04-25 DIAGNOSIS — IMO0002 Reserved for concepts with insufficient information to code with codable children: Secondary | ICD-10-CM

## 2020-04-25 DIAGNOSIS — E785 Hyperlipidemia, unspecified: Secondary | ICD-10-CM

## 2020-04-25 LAB — LIPID PANEL
Cholesterol: 190 mg/dL (ref 0–200)
HDL: 32 mg/dL — ABNORMAL LOW (ref >39.00)
LDL Cholesterol: 124 mg/dL — ABNORMAL HIGH (ref 0–99)
NonHDL: 158.32
Total CHOL/HDL Ratio: 6
Triglycerides: 171 mg/dL — ABNORMAL HIGH (ref 0.0–149.0)
VLDL: 34.2 mg/dL (ref 0.0–40.0)

## 2020-04-25 LAB — T4, FREE: Free T4: 0.81 ng/dL (ref 0.60–1.60)

## 2020-04-25 LAB — COMPREHENSIVE METABOLIC PANEL
ALT: 9 U/L (ref 0–35)
AST: 12 U/L (ref 0–37)
Albumin: 3.9 g/dL (ref 3.5–5.2)
Alkaline Phosphatase: 66 U/L (ref 39–117)
BUN: 28 mg/dL — ABNORMAL HIGH (ref 6–23)
CO2: 26 mEq/L (ref 19–32)
Calcium: 9.5 mg/dL (ref 8.4–10.5)
Chloride: 108 mEq/L (ref 96–112)
Creatinine, Ser: 0.76 mg/dL (ref 0.40–1.20)
GFR: 73.55 mL/min (ref >60.00)
Glucose, Bld: 131 mg/dL — ABNORMAL HIGH (ref 70–99)
Potassium: 5.2 mEq/L — ABNORMAL HIGH (ref 3.5–5.1)
Sodium: 141 mEq/L (ref 135–145)
Total Bilirubin: 0.4 mg/dL (ref 0.2–1.2)
Total Protein: 6.7 g/dL (ref 6.0–8.3)

## 2020-04-25 LAB — VITAMIN D 25 HYDROXY (VIT D DEFICIENCY, FRACTURES): VITD: 33.21 ng/mL (ref 30.00–100.00)

## 2020-04-25 LAB — HEMOGLOBIN A1C: Hgb A1c MFr Bld: 6.3 % (ref 4.6–6.5)

## 2020-04-25 LAB — TSH: TSH: 1.37 u[IU]/mL (ref 0.35–4.50)

## 2020-04-25 LAB — VITAMIN B12: Vitamin B-12: 962 pg/mL — ABNORMAL HIGH (ref 211–911)

## 2020-04-28 DIAGNOSIS — E119 Type 2 diabetes mellitus without complications: Secondary | ICD-10-CM | POA: Diagnosis not present

## 2020-04-28 DIAGNOSIS — H349 Unspecified retinal vascular occlusion: Secondary | ICD-10-CM | POA: Diagnosis not present

## 2020-05-02 ENCOUNTER — Encounter: Payer: Self-pay | Admitting: Family Medicine

## 2020-05-02 ENCOUNTER — Other Ambulatory Visit: Payer: Self-pay

## 2020-05-02 ENCOUNTER — Ambulatory Visit (INDEPENDENT_AMBULATORY_CARE_PROVIDER_SITE_OTHER): Payer: Medicare HMO | Admitting: Family Medicine

## 2020-05-02 VITALS — BP 124/68 | HR 63 | Temp 97.6°F | Ht 60.0 in | Wt 146.0 lb

## 2020-05-02 DIAGNOSIS — R0981 Nasal congestion: Secondary | ICD-10-CM | POA: Diagnosis not present

## 2020-05-02 DIAGNOSIS — F0391 Unspecified dementia with behavioral disturbance: Secondary | ICD-10-CM | POA: Diagnosis not present

## 2020-05-02 DIAGNOSIS — E1169 Type 2 diabetes mellitus with other specified complication: Secondary | ICD-10-CM | POA: Diagnosis not present

## 2020-05-02 DIAGNOSIS — E0590 Thyrotoxicosis, unspecified without thyrotoxic crisis or storm: Secondary | ICD-10-CM | POA: Diagnosis not present

## 2020-05-02 DIAGNOSIS — I5032 Chronic diastolic (congestive) heart failure: Secondary | ICD-10-CM | POA: Diagnosis not present

## 2020-05-02 DIAGNOSIS — E538 Deficiency of other specified B group vitamins: Secondary | ICD-10-CM | POA: Diagnosis not present

## 2020-05-02 DIAGNOSIS — F03918 Unspecified dementia, unspecified severity, with other behavioral disturbance: Secondary | ICD-10-CM

## 2020-05-02 DIAGNOSIS — E118 Type 2 diabetes mellitus with unspecified complications: Secondary | ICD-10-CM | POA: Diagnosis not present

## 2020-05-02 DIAGNOSIS — E785 Hyperlipidemia, unspecified: Secondary | ICD-10-CM

## 2020-05-02 MED ORDER — AZELASTINE HCL 0.1 % NA SOLN
1.0000 | Freq: Two times a day (BID) | NASAL | 3 refills | Status: DC
Start: 2020-05-02 — End: 2021-05-04

## 2020-05-02 MED ORDER — LORATADINE 10 MG PO TABS: 10.0000 mg | ORAL_TABLET | Freq: Every day | ORAL | Status: DC

## 2020-05-02 MED ORDER — B-12 1000 MCG SL SUBL: 1.0000 | SUBLINGUAL_TABLET | SUBLINGUAL | Status: DC

## 2020-05-02 NOTE — Patient Instructions (Addendum)
Change antihistamine to claritin or allegra. If ongoing runny nose, may try astelin nasal spray (printed out Rx) or flonase nasal steroid.  Check on dose of potassium pills - we may need to drop dose a bit.  Drop vitamin b12 to Monday Wednesday Friday dosing.  Return in 3-4 months for wellness visit/physical.

## 2020-05-02 NOTE — Assessment & Plan Note (Signed)
Appreciate endo care.  Marked improvement since starting CGM, on lantus and metformin.  Pt and daughter very happy with results to date.

## 2020-05-02 NOTE — Telephone Encounter (Signed)
Updated pt's med list.  

## 2020-05-02 NOTE — Assessment & Plan Note (Signed)
Elevated level noted. Will drop dose to MWF 1058mcg daily b12.

## 2020-05-02 NOTE — Assessment & Plan Note (Signed)
Appreciate cards care.  Dry weight ~140lbs.  Reviewed lasix and potassium dosing.

## 2020-05-02 NOTE — Assessment & Plan Note (Signed)
Levels stable off medication.

## 2020-05-02 NOTE — Addendum Note (Signed)
Addended by: Brenton Grills on: 08/12/4067 02:11 PM   Modules accepted: Orders

## 2020-05-02 NOTE — Progress Notes (Signed)
Patient ID: Meredith Gutierrez, female    DOB: [birth year], 79 y.o.   MRN: [id number]  This visit was conducted in person.  BP 124/68 (BP Location: Left Arm, Patient Position: Sitting, Cuff Size: Normal)   Pulse 63   Temp 97.6 F (36.4 C) (Temporal)   Ht 5' (1.524 m)   Wt 146 lb (66.2 kg)   SpO2 96%   BMI 28.51 kg/m    CC: 3 mo f/u visit  Subjective:   HPI: Meredith Gutierrez is a 79 y.o. female presenting on 05/02/2020 for Follow-up (Here for 3 mo f/u.  Pt accompanied by daughter, [name]- temp 97.6.)   DM - does regularly check sugars with CGM. Followed by endocrinology Chalmers Cater). Compliant with antihyperglycemic regimen which includes: lantus 10u in am and 25u in pm, metformin XR 1034m daily. Occasional low sugars but no hypoglycemic symptoms. Denies paresthesias. Last diabetic eye exam 12/2019, 04/2020. Pneumovax: 2016. Prevnar: 2020. DSME: completed.  Lab Results  Component Value Date   HGBA1C 6.3 04/25/2020   Diabetic Foot Exam - Simple   No data filed     Lab Results  Component Value Date   MICROALBUR 2.8 (H) 08/10/2019     Saw Dr CSallyanne Kuster9/2021, note reviewed. Target dry weight 140 lbs. Planned diet restrictions to control HLD. Takes lasix 860mdaily, 16067mf weight >143lbs. Takes potassium with lasix - 2 daily but 3 if higher dose.   Daughter notes she's retaining more water recently. No recent change in diet or increased salt.   Increasing fall allergies noted - predominantly rhinorrhea despite daily zyrtec - on this antihistamine for years.      Relevant past medical, surgical, family and social history reviewed and updated as indicated. Interim medical history since our last visit reviewed. Allergies and medications reviewed and updated. Outpatient Medications Prior to Visit  Medication Sig Dispense Refill  . albuterol (PROVENTIL) (2.5 MG/3ML) 0.083% nebulizer solution Take 3 mLs (2.5 mg total) by nebulization every 6 (six) hours as needed for wheezing or shortness  of breath. 75 mL 1  . Alcohol Swabs (B-D SINGLE USE SWABS REGULAR) PADS 1 each by Does not apply route daily. Use as directed to check blood sugar once daily.  Dx code:  E11.65 100 each 0  . ascorbic acid (VITAMIN C) 500 MG tablet Take 500 mg by mouth daily.    . aMarland Kitchenpirin EC 81 MG tablet Take 81 mg by mouth daily.    . Blood Glucose Monitoring Suppl (ACCU-CHEK AVIVA PLUS) w/Device KIT 1 each by Does not apply route daily. Use as directed to check blood sugar once daily.  Dx code:  E11.65 1 kit 0  . candesartan (ATACAND) 8 MG tablet Take 1 tablet (8 mg total) by mouth daily. 90 tablet 1  . cholecalciferol (VITAMIN D3) 25 MCG (1000 UNIT) tablet Take 1,000 Units by mouth daily.    . Continuous Blood Gluc Receiver ([place]Jeff[place]EVI by Does not apply route.    . Continuous Blood Gluc Sensor (DEXCOM G6 SENSOR) MISC by Does not apply route.    . Continuous Blood Gluc Transmit (DEXCOM G6 TRANSMITTER) MISC by Does not apply route.    . furosemide (LASIX) 80 MG tablet TAKE 1 TO 2 TABLETS BY MOUTH DAILY AS DIRECTED 180 tablet 1  . glucose blood (ACCU-CHEK AVIVA PLUS) test strip 1 each by Other route as needed for other. Use as instructed to check blood sugar once daily.  Dx code:  E11.65 100  each 0  . insulin glargine (LANTUS SOLOSTAR) 100 UNIT/ML Solostar Pen Take 10u in the morning, 25u with dinner    . Insulin Pen Needle (PEN NEEDLES) 33G X 4 MM MISC Use as instructed to administer insulin daily. 100 each 1  . memantine (NAMENDA) 5 MG tablet Take 1 tablet (5 mg total) by mouth 2 (two) times daily. First 4 days take 1 tablet daily 60 tablet 3  . metFORMIN (GLUCOPHAGE-XR) 500 MG 24 hr tablet TAKE 2 TABLETS EVERY DAY WITH BREAKFAST 180 tablet 2  . morphine (MS CONTIN) 15 MG 12 hr tablet Take 1 tablet (15 mg total) by mouth in the morning. Per pain clinic    . morphine (MSIR) 15 MG tablet Take 15 mg by mouth every 6 (six) hours as needed for severe pain.    . nystatin cream (MYCOSTATIN) Apply 1  application topically 2 (two) times daily. 45 g 1  . QUEtiapine (SEROQUEL) 25 MG tablet Take 2 tablets (50 mg total) by mouth at bedtime. (Patient taking differently: Take 25-50 mg by mouth at bedtime. ) 60 tablet 11  . Cyanocobalamin (B-12) 1000 MCG SUBL Place 1 tablet under the tongue daily.    . furosemide (LASIX) 40 MG tablet Take 1 tablet (40 mg total) by mouth daily as needed. Take 120 mg (3 tablets) once daily for a weight of 135 pounds and over and take 80 mg (2 tablets) once daily for a weight of 134 and below. (Patient taking differently: Take 40 mg by mouth daily as needed for fluid. Take 120 mg (3 tablets) once daily for a weight of 135 pounds and over and take 80 mg (2 tablets) once daily for a weight of 134 and below.)    . ACCU-CHEK FASTCLIX LANCETS MISC 1 each by Does not apply route daily. Use as directed to check blood sugar once daily.  Dx code:  E11.65 (Patient not taking: Reported on 05/02/2020) 100 each 0  . Continuous Blood Gluc Receiver (FREESTYLE LIBRE 14 DAY READER) DEVI Use as directed to check sugars regularly 1 each 0  . Continuous Blood Gluc Sensor (FREESTYLE LIBRE 14 DAY SENSOR) MISC Use as directed to check sugars QID AC HS 2 each 3  . Continuous Blood Gluc Sensor (FREESTYLE LIBRE SENSOR SYSTEM) MISC Use as directed to check sugars QID AC HS 1 each 0   No facility-administered medications prior to visit.     Per HPI unless specifically indicated in ROS section below Review of Systems Objective:  BP 124/68 (BP Location: Left Arm, Patient Position: Sitting, Cuff Size: Normal)   Pulse 63   Temp 97.6 F (36.4 C) (Temporal)   Ht 5' (1.524 m)   Wt 146 lb (66.2 kg)   SpO2 96%   BMI 28.51 kg/m   Wt Readings from Last 3 Encounters:  05/02/20 146 lb (66.2 kg)  03/18/20 145 lb 4 oz (65.9 kg)  02/19/20 143 lb 12.8 oz (65.2 kg)      Physical Exam Vitals and nursing note reviewed.  Constitutional:      General: She is not in acute distress.    Appearance: Normal  appearance. She is well-developed. She is not ill-appearing.  HENT:     Head: Normocephalic and atraumatic.  Cardiovascular:     Rate and Rhythm: Normal rate and regular rhythm.     Pulses: Normal pulses.     Heart sounds: Normal heart sounds. No murmur heard.   Pulmonary:     Effort: Pulmonary effort is   normal. No respiratory distress.     Breath sounds: Normal breath sounds. No wheezing or rales.  Musculoskeletal:     Right lower leg: Edema (tr) present.     Left lower leg: Edema (tr) present.     Comments: See HPI for foot exam if done  Skin:    General: Skin is warm and dry.     Findings: No rash.  Neurological:     Mental Status: She is alert.  Psychiatric:        Mood and Affect: Mood normal.        Behavior: Behavior normal.       Results for orders placed or performed in visit on 04/25/20  Comprehensive metabolic panel  Result Value Ref Range   Sodium 141 135 - 145 mEq/L   Potassium 5.2 No hemolysis seen (H) 3.5 - 5.1 mEq/L   Chloride 108 96 - 112 mEq/L   CO2 26 19 - 32 mEq/L   Glucose, Bld 131 (H) 70 - 99 mg/dL   BUN 28 (H) 6 - 23 mg/dL   Creatinine, Ser 0.76 0.40 - 1.20 mg/dL   Total Bilirubin 0.4 0.2 - 1.2 mg/dL   Alkaline Phosphatase 66 39 - 117 U/L   AST 12 0 - 37 U/L   ALT 9 0 - 35 U/L   Total Protein 6.7 6.0 - 8.3 g/dL   Albumin 3.9 3.5 - 5.2 g/dL   GFR 73.55 >60.00 mL/min   Calcium 9.5 8.4 - 10.5 mg/dL  Lipid panel  Result Value Ref Range   Cholesterol 190 0 - 200 mg/dL   Triglycerides 171.0 (H) 0 - 149 mg/dL   HDL 32.00 (L) >39.00 mg/dL   VLDL 34.2 0.0 - 40.0 mg/dL   LDL Cholesterol 124 (H) 0 - 99 mg/dL   Total CHOL/HDL Ratio 6    NonHDL 158.32   TSH  Result Value Ref Range   TSH 1.37 0.35 - 4.50 uIU/mL  Hemoglobin A1c  Result Value Ref Range   Hgb A1c MFr Bld 6.3 4.6 - 6.5 %  Vitamin B12  Result Value Ref Range   Vitamin B-12 962 (H) 211 - 911 pg/mL  VITAMIN D 25 Hydroxy (Vit-D Deficiency, Fractures)  Result Value Ref Range   VITD  33.21 30.00 - 100.00 ng/mL  T4, free  Result Value Ref Range   Free T4 0.81 0.60 - 1.60 ng/dL   Assessment & Plan:  This visit occurred during the SARS-CoV-2 public health emergency.  Safety protocols were in place, including screening questions prior to the visit, additional usage of staff PPE, and extensive cleaning of exam room while observing appropriate contact time as indicated for disinfecting solutions.   Problem List Items Addressed This Visit    Low serum vitamin B12    Elevated level noted. Will drop dose to MWF 1081mg daily b12.       Hyperthyroidism    Levels stable off medication.       Dyslipidemia (statin intolerance) associated with type 2 diabetes mellitus ([place]    Chronically uncontrolled, did not tolerate statin or zetia, poor candidate for PCSK9 injection. Reviewed diet choices to improve LDL levels.  The ASCVD Risk score (Mikey BussingDC Jr., et al., 2013) failed to calculate for the following reasons:   The 2013 ASCVD risk score is only valid for ages 447to 769      Dementia, old age, with behavioral disturbance ([place]   Controlled diabetes mellitus type 2 with complications ([place]  Appreciate endo care.  Marked improvement since starting CGM, on lantus and metformin.  Pt and daughter very happy with results to date.       Chronic nasal congestion    Notes increased rhinorrhea this fall season. Discussed antihistamine and nasal spray use (astelin vs flonase).       Chronic diastolic (congestive) heart failure (HCC) (Chronic)    Appreciate cards care.  Dry weight ~140lbs.  Reviewed lasix and potassium dosing.           Meds ordered this encounter  Medications  . loratadine (CLARITIN) 10 MG tablet    Sig: Take 1 tablet (10 mg total) by mouth daily.  Marland Kitchen azelastine (ASTELIN) 0.1 % nasal spray    Sig: Place 1-2 sprays into both nostrils 2 (two) times daily. Use in each nostril as directed    Dispense:  30 mL    Refill:  3  . Cyanocobalamin (B-12)  1000 MCG SUBL    Sig: Place 1 tablet under the tongue every Monday, Wednesday, and Friday.   No orders of the defined types were placed in this encounter.   Patient Instructions  Change antihistamine to claritin or allegra. If ongoing runny nose, may try astelin nasal spray (printed out Rx) or flonase nasal steroid.  Check on dose of potassium pills - we may need to drop dose a bit.  Drop vitamin b12 to Monday Wednesday Friday dosing.  Return in 3-4 months for wellness visit/physical.     Follow up plan: Return in about 4 months (around 08/30/2020) for annual exam, prior fasting for blood work, medicare wellness visit.  Ria Bush, MD

## 2020-05-02 NOTE — Assessment & Plan Note (Signed)
Notes increased rhinorrhea this fall season. Discussed antihistamine and nasal spray use (astelin vs flonase).

## 2020-05-02 NOTE — Assessment & Plan Note (Signed)
Chronically uncontrolled, did not tolerate statin or zetia, poor candidate for PCSK9 injection. Reviewed diet choices to improve LDL levels.  The ASCVD Risk score Meredith Bussing DC Jr., et al., 2013) failed to calculate for the following reasons:   The 2013 ASCVD risk score is only valid for ages 57 to 104

## 2020-05-06 ENCOUNTER — Telehealth: Payer: Self-pay | Admitting: Cardiovascular Disease

## 2020-05-06 DIAGNOSIS — Z79899 Other long term (current) drug therapy: Secondary | ICD-10-CM

## 2020-05-06 DIAGNOSIS — I5032 Chronic diastolic (congestive) heart failure: Secondary | ICD-10-CM

## 2020-05-06 MED ORDER — POTASSIUM CHLORIDE ER 20 MEQ PO TBCR
EXTENDED_RELEASE_TABLET | ORAL | 3 refills | Status: DC
Start: 2020-05-06 — End: 2021-10-09

## 2020-05-06 NOTE — Telephone Encounter (Signed)
Left message to call back  

## 2020-05-06 NOTE — Telephone Encounter (Signed)
New Message  Pts daughter is returning call to [facility]  Please call back

## 2020-05-06 NOTE — Telephone Encounter (Signed)
Left message for patient to call back  

## 2020-05-06 NOTE — Telephone Encounter (Signed)
Spoke with patient's daughter. She reports for about a month they have been giving lasix pretty frequently. When patient gets lasix she gets 160mg  and 2 potassium pills. She's been around 137/138lbs but Dr. Sallyanne Kuster gave her a cut off of 143lbs before she takes the Lasix.   There have been no diet changes. Patient has lower extremity swelling in her feet and legs. She is short of breath with exertion. She has no cough.   On Friday patient was seen by PCP and weight was 146lbs. She was given Lasix on Saturday and Sunday. She weighed 140lbs Sunday, 141lbs on Monday and 146lbs this morning. Daughter gave patient 2-80mg  tablets of potassium and 2 potassium pills today. Patient placed on schedule for [facility], 12/23. Will route to MD for recommendations.

## 2020-05-06 NOTE — Telephone Encounter (Signed)
Spoke with patient's daughter. Informed her of Dr. Victorino December recommendations and new orders.   "Please have her take furosemide 80 mg twice daily on any day that she weighs over 140 lb and 80 mg daily on days when 140 lb or less.  Take one KCl supplement for every 80 mg of furosemide. BMET when she comes in to see [place] on 12/23 please. "  Order placed for BMP to be collected on 12/23.

## 2020-05-06 NOTE — Telephone Encounter (Signed)
Please have her take furosemide 80 mg twice daily on any day that she weighs over 140 lb and 80 mg daily on days when 140 lb or less.  Take one KCl supplement for every 80 mg of furosemide. BMET when she comes in to see [place] on 12/23 please.

## 2020-05-06 NOTE — Telephone Encounter (Signed)
See 11/23 telephone note.

## 2020-05-10 ENCOUNTER — Other Ambulatory Visit: Payer: Self-pay | Admitting: Neurology

## 2020-06-03 ENCOUNTER — Ambulatory Visit: Payer: Medicare HMO | Admitting: Neurology

## 2020-06-05 ENCOUNTER — Encounter: Payer: Self-pay | Admitting: Cardiology

## 2020-06-05 ENCOUNTER — Other Ambulatory Visit: Payer: Self-pay

## 2020-06-05 ENCOUNTER — Other Ambulatory Visit: Payer: Self-pay | Admitting: Family Medicine

## 2020-06-05 ENCOUNTER — Ambulatory Visit: Payer: Medicare HMO | Admitting: Cardiology

## 2020-06-05 VITALS — BP 118/74 | HR 66 | Ht 60.0 in | Wt 144.2 lb

## 2020-06-05 DIAGNOSIS — Z79899 Other long term (current) drug therapy: Secondary | ICD-10-CM

## 2020-06-05 DIAGNOSIS — E785 Hyperlipidemia, unspecified: Secondary | ICD-10-CM

## 2020-06-05 DIAGNOSIS — E1169 Type 2 diabetes mellitus with other specified complication: Secondary | ICD-10-CM | POA: Diagnosis not present

## 2020-06-05 DIAGNOSIS — E118 Type 2 diabetes mellitus with unspecified complications: Secondary | ICD-10-CM | POA: Diagnosis not present

## 2020-06-05 DIAGNOSIS — I10 Essential (primary) hypertension: Secondary | ICD-10-CM | POA: Diagnosis not present

## 2020-06-05 DIAGNOSIS — I5032 Chronic diastolic (congestive) heart failure: Secondary | ICD-10-CM

## 2020-06-05 DIAGNOSIS — U071 COVID-19: Secondary | ICD-10-CM | POA: Diagnosis not present

## 2020-06-05 DIAGNOSIS — F0391 Unspecified dementia with behavioral disturbance: Secondary | ICD-10-CM | POA: Diagnosis not present

## 2020-06-05 DIAGNOSIS — Z951 Presence of aortocoronary bypass graft: Secondary | ICD-10-CM

## 2020-06-05 DIAGNOSIS — F03918 Unspecified dementia, unspecified severity, with other behavioral disturbance: Secondary | ICD-10-CM

## 2020-06-05 DIAGNOSIS — Z794 Long term (current) use of insulin: Secondary | ICD-10-CM

## 2020-06-05 DIAGNOSIS — J1282 Pneumonia due to coronavirus disease 2019: Secondary | ICD-10-CM | POA: Diagnosis not present

## 2020-06-05 NOTE — Assessment & Plan Note (Signed)
LDL 122 Oct 2019- not a candidate for PCSK9 

## 2020-06-05 NOTE — Assessment & Plan Note (Signed)
Presumed vascular dementia. No longer drives. Daughter helps manage medications

## 2020-06-05 NOTE — Progress Notes (Signed)
Cardiology Office Note:    Date:  06/05/2020   ID:  Meredith Gutierrez, DOB 1941-04-29, MRN [id number]  PCP:  Ria Bush, MD  Cardiologist:  Sanda Klein, MD  Electrophysiologist:  None   Referring MD: Ria Bush, MD   CC: increased Lasix requirement   History of Present Illness:    Meredith Gutierrez is a 79 y.o. female with a hx of chronic diastolic heart failure.  She also has coronary disease, she had a CABG x3 in 2003, catheterization was done in 2013, the plan is for medical therapy.  She has hypertension.  Echo July 2020 showed normal LV function with an EF of 60 to 65% and mild LVH and moderate left atrial dilatation.  She has insulin-dependent diabetes, dyslipidemia with statin intolerance, COPD, and in January 2021 she had COVID pneumonia complicated by a DVT.  She is no longer on oral anticoagulation.  She has mild dementia.  The patient is given Lasix by her family based on her weight. She has been given a range of 137-142 lbs. If her weight is greater than 140 she gets 160 mg of Lasix, if it is less than 140 she gets 80 mg of Lasix. The family called and noted that they have had to give her higher dose of Lasix on a more frequent basis recently. She is in the office today for further evaluation. Symptomatically the patient is doing well. She has no orthopnea. She has no increased dyspnea on exertion. She has had some lower extremity edema, today in the office its only trace. Her weight today in the office is 144, her weight today at home was 142.  Past Medical History:  Diagnosis Date  . Angina    never needed to take nitroglycerin.  . Anxiety   . Blood transfusion without reported diagnosis   . Cerebral atherosclerosis   . Community acquired pneumonia of left lower lobe of lung 12/20/2018  . COPD (chronic obstructive pulmonary disease) ([place])    02-16-13-"pt. denies this"-shows on CXR of 2- 2013.  Marland Kitchen Coronary artery disease 2003   s/p CABG 2014  . COVID-19 virus  infection 06/2019   Covid-19 PNA  . Dementia ([place])   . Diabetes mellitus without complication ([place]) 28/7867   NEW ONSET  . Diastolic CHF, on admit treated. 08/10/2011  . Family history of adverse reaction to anesthesia    Dad and Sister had trouble waking up  . Fibromuscular dysplasia of renal artery (HCC)    right  . Heart murmur   . History of kidney stones 02-16-13   "hx. of overgrowth of muscle-causing some stricture"renal artery stenosis-being followed [facility]  . Hyperlipidemia   . Hypertension   . Hyperthyroidism    [place]  . Hyponatremia 04/2018  . Melanoma ([place]) 2010   skin cancers, 1 melanoma removed back  . Osteoarthritis   . Pneumonia due to COVID-19 virus 06/26/2019    Past Surgical History:  Procedure Laterality Date  . ABDOMINAL HYSTERECTOMY  1970   heavy bleeding  . APPENDECTOMY  1960s  . CARDIAC CATHETERIZATION  08/09/2011   no sign. coronary obstruction. LIMA is atretic w/excellent flow down the native LAD  . CARDIAC CATHETERIZATION  08/22/2001   no sign. coronary stenosis,  . CATARACT EXTRACTION, BILATERAL Bilateral   . CESAREAN SECTION    . CORONARY ARTERY BYPASS GRAFT  02-16-13   3'03-no bypasses -Had 2 aneurysm repairs(stents used)  . ESOPHAGOGASTRODUODENOSCOPY  04/2018   LA Grade A esophagitis, small nodule distal esophagus (  benign schwann cell hamartoma), empiric dilation to 55m ([place])  . LAPAROTOMY N/A 02/20/2013   Procedure: EXPLORATORY LAPAROTOMY;  WJanie Morning MD  . LEFT HEART CATHETERIZATION WITH CORONARY ANGIOGRAM N/A 08/09/2011   Procedure: LEFT HEART CATHETERIZATION WITH CORONARY ANGIOGRAM;  Surgeon: MSanda Klein MD;  [facility];  Service: Cardiovascular;  Laterality: N/A;  . NM MYOCAR PERF WALL MOTION  07/20/2010   normal  . RADIOLOGY WITH ANESTHESIA N/A 02/22/2019   Procedure: MRI OF ABDOMEN WITH AND WITHOUT CONTRAST;  Surgeon: Radiologist, Medication, MD;  [facility]  Service: Radiology;  Laterality: N/A;  . RADIOLOGY WITH  ANESTHESIA Bilateral 09/04/2019   Procedure: MRI WITH ANESTHESIA OF ABDOMEN WITH AND WITHOUT CONTRAST, BRAIN WITHOUT CONTRAST;  Surgeon: Radiologist, Medication, MD;  [facility]  Service: Radiology;  Laterality: Bilateral;  . SALPINGOOPHORECTOMY Bilateral 02/20/2013   benign seromucinous cystadenofibroma R ovary ([name] MD)  . TOOTH EXTRACTION    . TOTAL HIP ARTHROPLASTY Left 2015    Current Medications: Current Meds  Medication Sig  . albuterol (PROVENTIL) (2.5 MG/3ML) 0.083% nebulizer solution Take 3 mLs (2.5 mg total) by nebulization every 6 (six) hours as needed for wheezing or shortness of breath.  . Alcohol Swabs (B-D SINGLE USE SWABS REGULAR) PADS 1 each by Does not apply route daily. Use as directed to check blood sugar once daily.  Dx code:  E11.65  . ascorbic acid (VITAMIN C) 500 MG tablet Take 500 mg by mouth daily.  .Marland Kitchenaspirin EC 81 MG tablet Take 81 mg by mouth daily.  .Marland Kitchenazelastine (ASTELIN) 0.1 % nasal spray Place 1-2 sprays into both nostrils 2 (two) times daily. Use in each nostril as directed  . Blood Glucose Monitoring Suppl (ACCU-CHEK AVIVA PLUS) w/Device KIT 1 each by Does not apply route daily. Use as directed to check blood sugar once daily.  Dx code:  E11.65  . candesartan (ATACAND) 8 MG tablet Take 1 tablet (8 mg total) by mouth daily.  . cholecalciferol (VITAMIN D3) 25 MCG (1000 UNIT) tablet Take 1,000 Units by mouth daily.  . Continuous Blood Gluc Receiver ([place] DEVI by Does not apply route.  . Continuous Blood Gluc Sensor (DEXCOM G6 SENSOR) MISC by Does not apply route.  . Continuous Blood Gluc Transmit (DEXCOM G6 TRANSMITTER) MISC by Does not apply route.  . Cyanocobalamin (B-12) 1000 MCG SUBL Place 1 tablet under the tongue every Monday, Wednesday, and Friday.  . furosemide (LASIX) 80 MG tablet TAKE 1 TO 2 TABLETS BY MOUTH DAILY AS DIRECTED  . glucose blood (ACCU-CHEK AVIVA PLUS) test strip 1 each by Other route as needed for other. Use  as instructed to check blood sugar once daily.  Dx code:  E11.65  . Insulin Pen Needle (PEN NEEDLES) 33G X 4 MM MISC Use as instructed to administer insulin daily.  .Marland Kitchenloratadine (CLARITIN) 10 MG tablet Take 1 tablet (10 mg total) by mouth daily.  . memantine (NAMENDA) 5 MG tablet Take 1 tablet (5 mg total) by mouth 2 (two) times daily. First 4 days take 1 tablet daily  . metFORMIN (GLUCOPHAGE-XR) 500 MG 24 hr tablet TAKE 2 TABLETS EVERY DAY WITH BREAKFAST  . morphine (MSIR) 15 MG tablet Take 15 mg by mouth every 6 (six) hours as needed for severe pain.  .Marland KitchenNOVOLOG MIX 70/30 FLEXPEN (70-30) 100 UNIT/ML FlexPen 22 units in the morning, 32 units in the evening  . Potassium Chloride ER 20 MEQ TBCR Take 1-2 tablets daily as  directed.  Marland Kitchen QUEtiapine (SEROQUEL) 25 MG tablet Take 1-2 tablets (25-50 mg total) by mouth at bedtime.     Allergies:   Codeine, Prednisolone acetate, Prednisone, Nitroglycerin, Metolazone, Other, Adhesive [tape], Crestor [rosuvastatin calcium], Lopressor [metoprolol tartrate], Mupirocin, Sulfamethoxazole, and Zetia [ezetimibe]   Social History   Socioeconomic History  . Marital status: Widowed    Spouse name: Not on file  . Number of children: 2  . Years of education: 53  . Highest education level: High school graduate  Occupational History  . Occupation: Retired  Tobacco Use  . Smoking status: Former Smoker    Packs/day: 0.10    Years: 20.00    Pack years: 2.00    Types: Cigarettes    Quit date: 09/14/2017    Years since quitting: 2.7  . Smokeless tobacco: Never Used  . Tobacco comment: smoking cessation info given  Vaping Use  . Vaping Use: Never used  Substance and Sexual Activity  . Alcohol use: Yes    Comment: occasional  . Drug use: No  . Sexual activity: Not on file  Other Topics Concern  . Not on file  Social History Narrative   Widow   Lives with daughter [name] and daughter's GF   Edu: HS   Occ: retired, worked at [employer]   Activity: no regular  exercise   Diet: good water    Right-handed.   Occasional caffeine use.   Social Determinants of Health   Financial Resource Strain: Not on file  Food Insecurity: Not on file  Transportation Needs: Not on file  Physical Activity: Not on file  Stress: Not on file  Social Connections: Not on file     Family History: The patient's family history includes Breast cancer in her maternal aunt; Colon polyps in her maternal grandfather; Coronary artery disease in her father; Dementia (age of onset: 59) in her mother; Diabetes in her brother; Hypertension in her brother and father; Hypotension in her mother and sister; Lung cancer in her daughter; Stroke in her mother; Thyroid cancer in her daughter. There is no history of Colon cancer, Esophageal cancer, Stomach cancer, or Rectal cancer.  ROS:   Please see the history of present illness.     All other systems reviewed and are negative.  EKGs/Labs/Other Studies Reviewed:    The following studies were reviewed today: Echo July 2020- IMPRESSIONS    1. The left ventricle has normal systolic function with an ejection  fraction of 60-65%. The cavity size was normal. There is mild concentric  left ventricular hypertrophy. Left ventricular diastolic Doppler  parameters are consistent with impaired  relaxation. Elevated left ventricular end-diastolic pressure The E/e' is  21. No evidence of left ventricular regional wall motion abnormalities.  2. The right ventricle has normal systolic function. The cavity was  normal. There is no increase in right ventricular wall thickness. Right  ventricular systolic pressure is normal with an estimated pressure of 28.1  mmHg.  3. Left atrial size was moderately dilated.  4. Right atrial size was moderately dilated.  5. Mild thickening of the mitral valve leaflet. Mild calcification of the  mitral valve leaflet. There is mild mitral annular calcification present.  No evidence of mitral valve  stenosis.  6. Moderate thickening of the aortic valve. Moderate calcification of the  aortic valve. No stenosis of the aortic valve.  7. The aortic root is normal in size and structure.  8. There is mild dilatation of the ascending aorta measuring 41 mm.  9. The inferior vena cava was dilated in size with >50% respiratory  variability.   EKG:  EKG is not ordered today.  The ekg ordered 02/19/2020 demonstrates NSR-HR 74  Recent Labs: 06/26/2019: B Natriuretic Peptide 98.8 12/01/2019: Hemoglobin 14.3; Platelets 286 04/25/2020: ALT 9; BUN 28; Creatinine, Ser 0.76; Potassium 5.2 No hemolysis seen; Sodium 141; TSH 1.37  Recent Lipid Panel    Component Value Date/Time   CHOL 190 04/25/2020 0811   TRIG 171.0 (H) 04/25/2020 0811   HDL 32.00 (L) 04/25/2020 0811   CHOLHDL 6 04/25/2020 0811   VLDL 34.2 04/25/2020 0811   LDLCALC 124 (H) 04/25/2020 0811   LDLDIRECT 161.0 08/10/2019 0822    Physical Exam:    VS:  BP 118/74   Pulse 66   Ht 5' (1.524 m)   Wt 144 lb 3.2 oz (65.4 kg)   SpO2 99%   BMI 28.16 kg/m     Wt Readings from Last 3 Encounters:  06/05/20 144 lb 3.2 oz (65.4 kg)  05/02/20 146 lb (66.2 kg)  03/18/20 145 lb 4 oz (65.9 kg)     GEN:  Overweight female,, well developed in no acute distress HEENT: Normal NECK: No JVD CARDIAC: RRR, no murmurs, rubs, gallops RESPIRATORY:  Faint crackles Lt base otherwise clear ABDOMEN: Soft,non-distended MUSCULOSKELETAL:  Trace bilateral LE edema; No deformity  SKIN: Warm and dry NEUROLOGIC:  Alert and oriented x 3 PSYCHIATRIC:  Normal affect   ASSESSMENT:    Chronic diastolic (congestive) heart failure (HCC) She has required more asix recently based on her weight but she is asymptomatic  Essential hypertension Controlled- echo July 2020-Mild LVH, EF 60-65%  Hx of CABG CABG 08/24/2001, off pump, LIMA to LAD, SVG to 1st diagonal. Ligation of LAD artery aneurysm. Cath done in 2013- medical Rx. No history of recent  angina  Pneumonia due to COVID-19 virus Jan 4562- complicated by LE DVT  Dementia, old age, with behavioral disturbance ([place]) Presumed vascular dementia. No longer drives. Daughter helps manage medications  Dyslipidemia (statin intolerance) associated with type 2 diabetes mellitus ([place]) LDL 122 Oct 2019- not a candidate for PCSK9  Controlled diabetes mellitus type 2 with complications ([place]) Followed by PCP  PLAN:    The patient appears stable from a heart failure standpoint.  We will check a BMP to monitor her renal function on the increased diuretics she has been getting.  Since she is asymptomatic I did not repeat her echocardiogram.   Medication Adjustments/Labs and Tests Ordered: Current medicines are reviewed at length with the patient today.  Concerns regarding medicines are outlined above.  Orders Placed This Encounter  Procedures  . Basic Metabolic Panel (BMET)   No orders of the defined types were placed in this encounter.   Patient Instructions  Medication Instructions:  Continue current medication  *If you need a refill on your cardiac medications before your next appointment, please call your pharmacy*   Lab Work: BMP Today  If you have labs (blood work) drawn today and your tests are completely normal, you will receive your results only by: Marland Kitchen MyChart Message (if you have MyChart) OR . A paper copy in the mail If you have any lab test that is abnormal or we need to change your treatment, we will call you to review the results.   Testing/Procedures: None Ordered   Follow-Up: At [facility], you and your health needs are our priority.  As part of our continuing mission to provide you with exceptional heart care,  we have created designated Provider Care Teams.  These Care Teams include your primary Cardiologist (physician) and Advanced Practice Providers (APPs -  Physician Assistants and Nurse Practitioners) who all work together to provide you with the  care you need, when you need it.  We recommend signing up for the patient portal called "MyChart".  Sign up information is provided on this After Visit Summary.  MyChart is used to connect with patients for Virtual Visits (Telemedicine).  Patients are able to view lab/test results, encounter notes, upcoming appointments, etc.  Non-urgent messages can be sent to your provider as well.   To learn more about what you can do with MyChart, go to [email or web address].    Your next appointment:   3 month(s)  The format for your next appointment:   In Person  Provider:   You may see Sanda Klein, MD or one of the following Advanced Practice Providers on your designated Care Team:    Almyra Deforest, PA-C  Fabian Sharp, [place] or   Roby Lofts, PA-C        Signed, Kerin Ransom, [place]  06/05/2020 8:28 AM    [place]

## 2020-06-05 NOTE — Assessment & Plan Note (Signed)
CABG 08/24/2001, off pump, LIMA to LAD, SVG to 1st diagonal. Ligation of LAD artery aneurysm. Cath done in 2013- medical Rx. No history of recent angina

## 2020-06-05 NOTE — Assessment & Plan Note (Addendum)
Controlled- echo July 2020-Mild LVH, EF 60-65%

## 2020-06-05 NOTE — Assessment & Plan Note (Signed)
Jan 6286- complicated by LE DVT

## 2020-06-05 NOTE — Patient Instructions (Signed)
Medication Instructions:  Continue current medication  *If you need a refill on your cardiac medications before your next appointment, please call your pharmacy*   Lab Work: BMP Today  If you have labs (blood work) drawn today and your tests are completely normal, you will receive your results only by:  [place] (if you have MyChart) OR  A paper copy in the mail If you have any lab test that is abnormal or we need to change your treatment, we will call you to review the results.   Testing/Procedures: None Ordered   Follow-Up: At [facility], you and your health needs are our priority.  As part of our continuing mission to provide you with exceptional heart care, we have created designated Provider Care Teams.  These Care Teams include your primary Cardiologist (physician) and Advanced Practice Providers (APPs -  Physician Assistants and Nurse Practitioners) who all work together to provide you with the care you need, when you need it.  We recommend signing up for the patient portal called "MyChart".  Sign up information is provided on this After Visit Summary.  MyChart is used to connect with patients for Virtual Visits (Telemedicine).  Patients are able to view lab/test results, encounter notes, upcoming appointments, etc.  Non-urgent messages can be sent to your provider as well.   To learn more about what you can do with MyChart, go to [email or web address].    Your next appointment:   3 month(s)  The format for your next appointment:   In Person  Provider:   You may see Sanda Klein, MD or one of the following Advanced Practice Providers on your designated Care Team:    Almyra Deforest, PA-C  Fabian Sharp, PA-C or   Roby Lofts, [place]

## 2020-06-05 NOTE — Assessment & Plan Note (Signed)
She has required more asix recently based on her weight but she is asymptomatic

## 2020-06-05 NOTE — Assessment & Plan Note (Signed)
Followed by PCP

## 2020-06-06 LAB — BASIC METABOLIC PANEL
BUN/Creatinine Ratio: 40 — ABNORMAL HIGH (ref 12–28)
BUN: 35 mg/dL — ABNORMAL HIGH (ref 8–27)
CO2: 25 mmol/L (ref 20–29)
Calcium: 9.8 mg/dL (ref 8.7–10.3)
Chloride: 100 mmol/L (ref 96–106)
Creatinine, Ser: 0.87 mg/dL (ref 0.57–1.00)
GFR calc Af Amer: 72 mL/min/{1.73_m2} (ref >59)
GFR calc non Af Amer: 63 mL/min/{1.73_m2} (ref >59)
Glucose: 121 mg/dL — ABNORMAL HIGH (ref 65–99)
Potassium: 5.4 mmol/L — ABNORMAL HIGH (ref 3.5–5.2)
Sodium: 138 mmol/L (ref 134–144)

## 2020-06-12 ENCOUNTER — Other Ambulatory Visit: Payer: Self-pay | Admitting: *Deleted

## 2020-06-12 ENCOUNTER — Encounter: Payer: Self-pay | Admitting: Neurology

## 2020-06-12 ENCOUNTER — Telehealth: Payer: Self-pay | Admitting: Cardiology

## 2020-06-12 ENCOUNTER — Ambulatory Visit: Payer: Medicare HMO | Admitting: Neurology

## 2020-06-12 VITALS — BP 122/62 | HR 74 | Ht 60.0 in | Wt 146.0 lb

## 2020-06-12 DIAGNOSIS — E875 Hyperkalemia: Secondary | ICD-10-CM

## 2020-06-12 DIAGNOSIS — F03918 Unspecified dementia, unspecified severity, with other behavioral disturbance: Secondary | ICD-10-CM

## 2020-06-12 DIAGNOSIS — F0391 Unspecified dementia with behavioral disturbance: Secondary | ICD-10-CM | POA: Diagnosis not present

## 2020-06-12 DIAGNOSIS — R899 Unspecified abnormal finding in specimens from other organs, systems and tissues: Secondary | ICD-10-CM

## 2020-06-12 MED ORDER — QUETIAPINE FUMARATE 25 MG PO TABS: 25.0000 mg | ORAL_TABLET | Freq: Every day | ORAL | 1 refills | Status: DC

## 2020-06-12 NOTE — Telephone Encounter (Signed)
Spoke to patient 's daughter Dellia Cloud.   Daughter verbalized understanding. She is aware information can be obtained on Mychart.   updated medication list. Order placed for BMP for 2 weeks.

## 2020-06-12 NOTE — Telephone Encounter (Signed)
Daughter of patient returning a call to discuss lab results

## 2020-06-12 NOTE — Progress Notes (Signed)
PATIENT: Meredith Gutierrez DOB: 15-Jun-1940  REASON FOR VISIT: follow up HISTORY FROM: patient  HISTORY OF PRESENT ILLNESS: Today 06/12/20  HISTORY  Meredith Gutierrez is a 79 year old female, seen in request by her primary care physician Dr. Ria Bush for evaluation of dementia, initial evaluation was on August 28, 2019.  I have reviewed and summarized the referring note from the referring physician.  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, she was noted to have gradual onset of memory loss since 2017, gradually getting worse, had 24-hour care since 2019, she had significant worsening of memory loss, gait abnormality, hallucinations since her hospital admission for Covid virus pneumonia on June 27, 2019  She is now back home, has evening time agitation, frequent pacing around, difficulty sleeping, carry on a conversation during her hallucinations, noticed more difficulty walking, dragging her right leg more,  Previously she has tried Aricept did not notice any improvement,  I personally reviewed CT head without contrast in July 2020, chronic atrophy, supratentorium small vessel disease  Laboratory evaluations in 2021 showed normal B12, elevated triglyceride 202, CMP showed mild elevated glucose 123, normal CBC, TSH, A1c  7.2  UPDATE December 03 2019: She is accompanied by her daughter at today's clinical visit, Seroquel 25 mg 1 to 2 tablets at night works well for her, she can sleep much better, through night, only occasionally take 2 tablets, she has less hallucinations  We personally reviewed MRI of the brain without contrast in March 2021: Moderate generalized atrophy, no acute abnormalities  Update June 12, 2020 SS: Here with daughter. Getting around well. Lives with daughter. Memory  some decline, remembering things from back (her parents), short term memory isn't there. COVID last year Dec, took toll on her. On Seroquel 25-50 mg at bedtime, doing more 2 tablets than 1, up and down, roaming at night with just 1. If very active, 25 mg works. Hallucinations much better. Needs reminders for ADLs, doesn't forget to eat. Is diabetic, working with cardiology, increased Lasix due to swelling. Is not left alone. Has caregiver who stays when daughter is working. Namenda was not helpful, was d/c by daughter, no change noted.  REVIEW OF SYSTEMS: Out of a complete 14 system review of symptoms, the patient complains only of the following symptoms, and all other reviewed systems are negative.  Memory loss   ALLERGIES: Allergies  Allergen Reactions   Codeine Anaphylaxis and Shortness Of Breath   Prednisolone Acetate Shortness Of Breath and Nausea Only   Prednisone Hives, Shortness Of Breath and Swelling   Nitroglycerin Other (See Comments)    Reaction not recalled    Metolazone Other (See Comments)    Hypokalemia, hyponatremia   Other Other (See Comments)   Adhesive [Tape] Rash and Other (See Comments)    SKIN IS VERY SENSITIVE- TEARS AND BRUISES EASILY, also   Crestor [Rosuvastatin Calcium] Other (See Comments)    Weakness with statins   Lopressor [Metoprolol Tartrate] Other (See Comments)    Bradycardia    Mupirocin Rash   Sulfamethoxazole Hives and Swelling   Zetia [Ezetimibe] Other (See Comments)    Weakness     HOME MEDICATIONS: Outpatient Medications Prior to Visit  Medication Sig Dispense Refill   albuterol (PROVENTIL) (2.5 MG/3ML) 0.083% nebulizer solution Take 3 mLs (2.5 mg total)  by nebulization every 6 (six) hours as needed for wheezing or shortness of breath. 75 mL 1   Alcohol Swabs (B-D SINGLE USE SWABS REGULAR) PADS 1 each by Does not apply route daily. Use as directed to check blood sugar once daily.  Dx code:  E11.65 100 each 0   ascorbic acid  (VITAMIN C) 500 MG tablet Take 500 mg by mouth daily.     aspirin EC 81 MG tablet Take 81 mg by mouth daily.     azelastine (ASTELIN) 0.1 % nasal spray Place 1-2 sprays into both nostrils 2 (two) times daily. Use in each nostril as directed 30 mL 3   Blood Glucose Monitoring Suppl (ACCU-CHEK AVIVA PLUS) w/Device KIT 1 each by Does not apply route daily. Use as directed to check blood sugar once daily.  Dx code:  E11.65 1 kit 0   candesartan (ATACAND) 8 MG tablet TAKE 1 TABLET EVERY DAY 90 tablet 3   cholecalciferol (VITAMIN D3) 25 MCG (1000 UNIT) tablet Take 1,000 Units by mouth daily.     Continuous Blood Gluc Receiver ([place]) DEVI by Does not apply route.     Continuous Blood Gluc Sensor (DEXCOM G6 SENSOR) MISC by Does not apply route.     Continuous Blood Gluc Transmit (DEXCOM G6 TRANSMITTER) MISC by Does not apply route.     Cyanocobalamin (B-12) 1000 MCG SUBL Place 1 tablet under the tongue every Monday, Wednesday, and Friday.     furosemide (LASIX) 80 MG tablet TAKE 1 TO 2 TABLETS BY MOUTH DAILY AS DIRECTED 180 tablet 1   glucose blood (ACCU-CHEK AVIVA PLUS) test strip 1 each by Other route as needed for other. Use as instructed to check blood sugar once daily.  Dx code:  E11.65 100 each 0   Insulin Pen Needle (PEN NEEDLES) 33G X 4 MM MISC Use as instructed to administer insulin daily. 100 each 1   loratadine (CLARITIN) 10 MG tablet Take 1 tablet (10 mg total) by mouth daily.     memantine (NAMENDA) 5 MG tablet Take 1 tablet (5 mg total) by mouth 2 (two) times daily. First 4 days take 1 tablet daily 60 tablet 3   metFORMIN (GLUCOPHAGE-XR) 500 MG 24 hr tablet TAKE 2 TABLETS EVERY DAY WITH BREAKFAST 180 tablet 2   morphine (MSIR) 15 MG tablet Take 15 mg by mouth every 6 (six) hours as needed for severe pain.     NOVOLOG MIX 70/30 FLEXPEN (70-30) 100 UNIT/ML FlexPen 22 units in the morning, 32 units in the evening     Potassium Chloride ER 20 MEQ TBCR Take 1-2  tablets daily as directed. 180 tablet 3   QUEtiapine (SEROQUEL) 25 MG tablet Take 1-2 tablets (25-50 mg total) by mouth at bedtime. 180 tablet 0   No facility-administered medications prior to visit.    PAST MEDICAL HISTORY: Past Medical History:  Diagnosis Date   Angina    never needed to take nitroglycerin.   Anxiety    Blood transfusion without reported diagnosis    Cerebral atherosclerosis    Community acquired pneumonia of left lower lobe of lung 12/20/2018   COPD (chronic obstructive pulmonary disease) ([place])    02-16-13-"pt. denies this"-shows on CXR of 2- 2013.   Coronary artery disease 2003   s/p CABG 2014   COVID-19 virus infection 06/2019   Covid-19 PNA   Dementia ([place])    Diabetes mellitus without complication ([place]) 82/5003   NEW ONSET   Diastolic CHF, on  admit treated. 08/10/2011   Family history of adverse reaction to anesthesia    Dad and Sister had trouble waking up   Fibromuscular dysplasia of renal artery ([facility])    right   Heart murmur    History of kidney stones 02-16-13   "hx. of overgrowth of muscle-causing some stricture"renal artery stenosis-being followed [facility]   Hyperlipidemia    Hypertension    Hyperthyroidism    [place]   Hyponatremia 04/2018   Melanoma ([place]) 2010   skin cancers, 1 melanoma removed back   Osteoarthritis    Pneumonia due to COVID-19 virus 06/26/2019    PAST SURGICAL HISTORY: Past Surgical History:  Procedure Laterality Date   ABDOMINAL HYSTERECTOMY  1970   heavy bleeding   APPENDECTOMY  1960s   CARDIAC CATHETERIZATION  08/09/2011   no sign. coronary obstruction. LIMA is atretic w/excellent flow down the native LAD   CARDIAC CATHETERIZATION  08/22/2001   no sign. coronary stenosis,   CATARACT EXTRACTION, BILATERAL Bilateral    CESAREAN SECTION     CORONARY ARTERY BYPASS GRAFT  02-16-13   3'03-no bypasses -Had 2 aneurysm repairs(stents used)   ESOPHAGOGASTRODUODENOSCOPY  04/2018   LA Grade A esophagitis,  small nodule distal esophagus (benign schwann cell hamartoma), empiric dilation to 72m ([place])   LAPAROTOMY N/A 02/20/2013   Procedure: EXPLORATORY LAPAROTOMY;  WJanie Morning MD   LEFT HEART CATHETERIZATION WITH CORONARY ANGIOGRAM N/A 08/09/2011   Procedure: LEFT HEART CATHETERIZATION WITH CORONARY ANGIOGRAM;  Surgeon: MSanda Klein MD;  [facility];  Service: Cardiovascular;  Laterality: N/A;   NM MYOCAR PERF WALL MOTION  07/20/2010   normal   RADIOLOGY WITH ANESTHESIA N/A 02/22/2019   Procedure: MRI OF ABDOMEN WITH AND WITHOUT CONTRAST;  Surgeon: Radiologist, Medication, MD;  [facility]  Service: Radiology;  Laterality: N/A;   RADIOLOGY WITH ANESTHESIA Bilateral 09/04/2019   Procedure: MRI WITH ANESTHESIA OF ABDOMEN WITH AND WITHOUT CONTRAST, BRAIN WITHOUT CONTRAST;  Surgeon: Radiologist, Medication, MD;  [facility]  Service: Radiology;  Laterality: Bilateral;   SALPINGOOPHORECTOMY Bilateral 02/20/2013   benign seromucinous cystadenofibroma R ovary ([name] MD)   TOOTH EXTRACTION     TOTAL HIP ARTHROPLASTY Left 2015    FAMILY HISTORY: Family History  Problem Relation Age of Onset   Stroke Mother    Hypotension Mother    Dementia Mother 656  Coronary artery disease Father    Hypertension Father    Breast cancer Maternal Aunt    Hypotension Sister    Hypertension Brother    Diabetes Brother    Lung cancer Daughter    Thyroid cancer Daughter    Colon polyps Maternal Grandfather    Colon cancer Neg Hx    Esophageal cancer Neg Hx    Stomach cancer Neg Hx    Rectal cancer Neg Hx     SOCIAL HISTORY: Social History   Socioeconomic History   Marital status: Widowed    Spouse name: Not on file   Number of children: 2   Years of education: 12   Highest education level: High school graduate  Occupational History   Occupation: Retired  Tobacco Use   Smoking status: Former Smoker    Packs/day: 0.10    Years: 20.00     Pack years: 2.00    Types: Cigarettes    Quit date: 09/14/2017    Years since quitting: 2.7   Smokeless tobacco: Never Used   Tobacco comment: smoking cessation info given  Vaping Use  Vaping Use: Never used  Substance and Sexual Activity   Alcohol use: Yes    Comment: occasional   Drug use: No   Sexual activity: Not on file  Other Topics Concern   Not on file  Social History Narrative   Widow   Lives with daughter [name] and daughter's GF   Edu: HS   Occ: retired, worked at [employer]   Activity: no regular exercise   Diet: good water    Right-handed.   Occasional caffeine use.   Social Determinants of Health   Financial Resource Strain: Not on file  Food Insecurity: Not on file  Transportation Needs: Not on file  Physical Activity: Not on file  Stress: Not on file  Social Connections: Not on file  Intimate Partner Violence: Not on file   PHYSICAL EXAM  Vitals:   06/12/20 0945  BP: 122/62  Pulse: 74  Weight: 146 lb (66.2 kg)  Height: 5' (1.524 m)   Body mass index is 28.51 kg/m.  Generalized: Well developed, in no acute distress   Neurological examination  Mentation: Alert, History is provided by her daughter, disoriented to date, answers limited questions, does fairly well with exam commands Cranial nerve II-XII: Pupils were equal round reactive to light. Extraocular movements were full, visual field were full on confrontational test. Facial sensation and strength were normal. Head turning and shoulder shrug  were normal and symmetric. Motor: Good strength all extremities Sensory: Sensory testing is intact to soft touch on all 4 extremities. No evidence of extinction is noted.  Coordination: Cerebellar testing reveals good finger-nose-finger and heel-to-shin bilaterally.  Gait and station: Gait is somewhat cautious, but steady Reflexes: Deep tendon reflexes are symmetric and normal bilaterally.   DIAGNOSTIC DATA (LABS, IMAGING, TESTING) - I reviewed  patient records, labs, notes, testing and imaging myself where available.  Lab Results  Component Value Date   WBC 9.6 12/01/2019   HGB 14.3 12/01/2019   HCT 42.0 12/01/2019   MCV 91.8 12/01/2019   PLT 286 12/01/2019      Component Value Date/Time   NA 138 06/05/2020 0827   NA 140 03/01/2013 1308   K 5.4 (H) 06/05/2020 0827   K 4.1 03/01/2013 1308   CL 100 06/05/2020 0827   CO2 25 06/05/2020 0827   CO2 24 03/01/2013 1308   GLUCOSE 121 (H) 06/05/2020 0827   GLUCOSE 131 (H) 04/25/2020 0811   GLUCOSE 110 03/01/2013 1308   BUN 35 (H) 06/05/2020 0827   BUN 13.5 03/01/2013 1308   CREATININE 0.87 06/05/2020 0827   CREATININE 0.7 03/01/2013 1308   CALCIUM 9.8 06/05/2020 0827   CALCIUM 9.7 03/01/2013 1308   PROT 6.7 04/25/2020 0811   PROT 7.1 03/01/2013 1308   ALBUMIN 3.9 04/25/2020 0811   ALBUMIN 3.4 (L) 03/01/2013 1308   AST 12 04/25/2020 0811   AST 14 03/01/2013 1308   ALT 9 04/25/2020 0811   ALT 17 03/01/2013 1308   ALKPHOS 66 04/25/2020 0811   ALKPHOS 74 03/01/2013 1308   BILITOT 0.4 04/25/2020 0811   BILITOT 0.27 03/01/2013 1308   GFRNONAA 63 06/05/2020 0827   GFRAA 72 06/05/2020 0827   Lab Results  Component Value Date   CHOL 190 04/25/2020   HDL 32.00 (L) 04/25/2020   LDLCALC 124 (H) 04/25/2020   LDLDIRECT 161.0 08/10/2019   TRIG 171.0 (H) 04/25/2020   CHOLHDL 6 04/25/2020   Lab Results  Component Value Date   HGBA1C 6.3 04/25/2020   Lab Results  Component  Value Date   VITAMINB12 962 (H) 04/25/2020   Lab Results  Component Value Date   TSH 1.37 04/25/2020   ASSESSMENT AND PLAN 79 y.o. year old female  has a past medical history of Angina, Anxiety, Blood transfusion without reported diagnosis, Cerebral atherosclerosis, Community acquired pneumonia of left lower lobe of lung (12/20/2018), COPD (chronic obstructive pulmonary disease) ([place]), Coronary artery disease (2003), COVID-19 virus infection (06/2019), Dementia ([place]), Diabetes mellitus without  complication ([place]) (41/5516), Diastolic CHF, on admit treated. (08/10/2011), Family history of adverse reaction to anesthesia, Fibromuscular dysplasia of renal artery ([place]), Heart murmur, History of kidney stones (02-16-13), Hyperlipidemia, Hypertension, Hyperthyroidism, Hyponatremia (04/2018), Melanoma ([place]) (2010), Osteoarthritis, and Pneumonia due to COVID-19 virus (06/26/2019). here with:  1.  Dementia with behavioral issues -Continues with some decline, last MMSE 10/30 -Hallucinations, behavior well controlled with Seroquel at bedtime -Continue Seroquel 1-2 tablets at bedtime  -Namenda was not helpful, was discontinued -Continue follow-up with PCP, follow-up in our office on an as-needed basis  I spent 20 minutes of face-to-face and non-face-to-face time with patient.  This included previsit chart review, lab review, study review, order entry, electronic health record documentation, patient education.   Butler Denmark, AGNP-C, DNP 06/12/2020, 10:06 AM [facility] [address], [place], [place] [address] [phone]

## 2020-06-12 NOTE — Patient Instructions (Signed)
Continue Seroquel at current dosing Continue follow-up with primary care  See you back as needed

## 2020-06-12 NOTE — Telephone Encounter (Signed)
Abelino Derrick, PA-C  06/10/2020 4:16 PM EST      Please let the patient know I reviewed her labs. She appears to be a little dehydrated. I would stop K+ supplement and decrease her Lasix to 80 mg daily (no extra unless her weight is > 145 lbs). Re check BMP in two weeks.  Corine Shelter PA-C 06/10/2020 4:16 PM

## 2020-07-01 DIAGNOSIS — E1165 Type 2 diabetes mellitus with hyperglycemia: Secondary | ICD-10-CM | POA: Diagnosis not present

## 2020-07-03 DIAGNOSIS — R899 Unspecified abnormal finding in specimens from other organs, systems and tissues: Secondary | ICD-10-CM | POA: Diagnosis not present

## 2020-07-03 DIAGNOSIS — E875 Hyperkalemia: Secondary | ICD-10-CM | POA: Diagnosis not present

## 2020-07-03 DIAGNOSIS — M47816 Spondylosis without myelopathy or radiculopathy, lumbar region: Secondary | ICD-10-CM | POA: Diagnosis not present

## 2020-07-04 LAB — BASIC METABOLIC PANEL WITH GFR
BUN/Creatinine Ratio: 28 (ref 12–28)
BUN: 28 mg/dL — ABNORMAL HIGH (ref 8–27)
CO2: 22 mmol/L (ref 20–29)
Calcium: 9.4 mg/dL (ref 8.7–10.3)
Chloride: 98 mmol/L (ref 96–106)
Creatinine, Ser: 1.01 mg/dL — ABNORMAL HIGH (ref 0.57–1.00)
GFR calc Af Amer: 60 mL/min/1.73
GFR calc non Af Amer: 52 mL/min/1.73 — ABNORMAL LOW
Glucose: 122 mg/dL — ABNORMAL HIGH (ref 65–99)
Potassium: 4.8 mmol/L (ref 3.5–5.2)
Sodium: 134 mmol/L (ref 134–144)

## 2020-07-16 DIAGNOSIS — E139 Other specified diabetes mellitus without complications: Secondary | ICD-10-CM | POA: Diagnosis not present

## 2020-07-16 DIAGNOSIS — I10 Essential (primary) hypertension: Secondary | ICD-10-CM | POA: Diagnosis not present

## 2020-07-16 DIAGNOSIS — E0590 Thyrotoxicosis, unspecified without thyrotoxic crisis or storm: Secondary | ICD-10-CM | POA: Diagnosis not present

## 2020-08-04 DIAGNOSIS — L820 Inflamed seborrheic keratosis: Secondary | ICD-10-CM | POA: Diagnosis not present

## 2020-09-12 ENCOUNTER — Ambulatory Visit: Payer: Medicare HMO | Admitting: Cardiovascular Disease

## 2020-09-19 DIAGNOSIS — E1165 Type 2 diabetes mellitus with hyperglycemia: Secondary | ICD-10-CM | POA: Diagnosis not present

## 2020-09-22 DIAGNOSIS — M47816 Spondylosis without myelopathy or radiculopathy, lumbar region: Secondary | ICD-10-CM | POA: Diagnosis not present

## 2020-10-22 DIAGNOSIS — H02002 Unspecified entropion of right lower eyelid: Secondary | ICD-10-CM | POA: Diagnosis not present

## 2020-10-22 DIAGNOSIS — H40053 Ocular hypertension, bilateral: Secondary | ICD-10-CM | POA: Diagnosis not present

## 2020-10-22 DIAGNOSIS — E119 Type 2 diabetes mellitus without complications: Secondary | ICD-10-CM | POA: Diagnosis not present

## 2020-10-22 DIAGNOSIS — H02005 Unspecified entropion of left lower eyelid: Secondary | ICD-10-CM | POA: Diagnosis not present

## 2020-11-18 ENCOUNTER — Telehealth: Payer: Self-pay | Admitting: Family Medicine

## 2020-11-18 NOTE — Telephone Encounter (Signed)
LVM for pt to rtn my call to schedule AWV with [facility].  

## 2020-12-08 DIAGNOSIS — E1165 Type 2 diabetes mellitus with hyperglycemia: Secondary | ICD-10-CM | POA: Diagnosis not present

## 2020-12-13 ENCOUNTER — Other Ambulatory Visit: Payer: Self-pay | Admitting: Neurology

## 2020-12-16 ENCOUNTER — Other Ambulatory Visit: Payer: Self-pay

## 2020-12-16 DIAGNOSIS — F0390 Unspecified dementia without behavioral disturbance: Secondary | ICD-10-CM | POA: Diagnosis not present

## 2020-12-16 DIAGNOSIS — M47816 Spondylosis without myelopathy or radiculopathy, lumbar region: Secondary | ICD-10-CM | POA: Diagnosis not present

## 2020-12-16 NOTE — Telephone Encounter (Signed)
Patient has been given in the past by urology. She has been released by them and told refills need to come from PCP. Patient is due for refill and would like sent to mail order.   Last seen in office 05/02/2020 Follow up 05/04/2021

## 2020-12-17 MED ORDER — QUETIAPINE FUMARATE 25 MG PO TABS: 25.0000 mg | ORAL_TABLET | Freq: Every day | ORAL | 1 refills | Status: DC

## 2021-01-13 DIAGNOSIS — E139 Other specified diabetes mellitus without complications: Secondary | ICD-10-CM | POA: Diagnosis not present

## 2021-01-13 DIAGNOSIS — R634 Abnormal weight loss: Secondary | ICD-10-CM | POA: Diagnosis not present

## 2021-01-13 DIAGNOSIS — E0590 Thyrotoxicosis, unspecified without thyrotoxic crisis or storm: Secondary | ICD-10-CM | POA: Diagnosis not present

## 2021-01-13 DIAGNOSIS — E1165 Type 2 diabetes mellitus with hyperglycemia: Secondary | ICD-10-CM | POA: Diagnosis not present

## 2021-02-26 ENCOUNTER — Other Ambulatory Visit: Payer: Self-pay

## 2021-02-26 ENCOUNTER — Ambulatory Visit (INDEPENDENT_AMBULATORY_CARE_PROVIDER_SITE_OTHER): Payer: Medicare HMO | Admitting: Nurse Practitioner

## 2021-02-26 VITALS — BP 154/68 | HR 67 | Temp 98.2°F | Resp 14 | Ht 60.0 in | Wt 136.4 lb

## 2021-02-26 DIAGNOSIS — E1165 Type 2 diabetes mellitus with hyperglycemia: Secondary | ICD-10-CM | POA: Diagnosis not present

## 2021-02-26 DIAGNOSIS — H6123 Impacted cerumen, bilateral: Secondary | ICD-10-CM | POA: Diagnosis not present

## 2021-02-26 NOTE — Progress Notes (Signed)
Acute Office Visit  Subjective:    Patient ID: Meredith Gutierrez, female    DOB: 1940/12/03, 80 y.o.   MRN: [id number]  Chief Complaint  Patient presents with   Hearing Problem    The past 2 days, today has been worse. Patient is here with her daughter. Patient has not complained of pain or issues with her ears.     HPI Patient is in today for Hearing issues  States that in the past 2 days she has a marked decrease in hearing. Did state that yesterday that the patient states that the back of her head. Check BP was elevated and then rechecked and was normal. States that she can talk to the patient in a normal tone and over the past two days she has had to borderline yell. Does not have hearing aids.  Past Medical History:  Diagnosis Date   Angina    never needed to take nitroglycerin.   Anxiety    Blood transfusion without reported diagnosis    Cerebral atherosclerosis    Community acquired pneumonia of left lower lobe of lung 12/20/2018   COPD (chronic obstructive pulmonary disease) ([place])    02-16-13-"pt. denies this"-shows on CXR of 2- 2013.   Coronary artery disease 2003   s/p CABG 2014   COVID-19 virus infection 06/2019   Covid-19 PNA   Dementia ([place])    Diabetes mellitus without complication ([place]) 19/5093   NEW ONSET   Diastolic CHF, on admit treated. 08/10/2011   Family history of adverse reaction to anesthesia    Dad and Sister had trouble waking up   Fibromuscular dysplasia of renal artery ([facility])    right   Heart murmur    History of kidney stones 02-16-13   "hx. of overgrowth of muscle-causing some stricture"renal artery stenosis-being followed [facility]   Hyperlipidemia    Hypertension    Hyperthyroidism    [facility]   Hyponatremia 04/2018   Melanoma ([place]) 2010   skin cancers, 1 melanoma removed back   Osteoarthritis    Pneumonia due to COVID-19 virus 06/26/2019    Past Surgical History:  Procedure Laterality Date   ABDOMINAL HYSTERECTOMY  1970   heavy bleeding    APPENDECTOMY  1960s   CARDIAC CATHETERIZATION  08/09/2011   no sign. coronary obstruction. LIMA is atretic w/excellent flow down the native LAD   CARDIAC CATHETERIZATION  08/22/2001   no sign. coronary stenosis,   CATARACT EXTRACTION, BILATERAL Bilateral    CESAREAN SECTION     CORONARY ARTERY BYPASS GRAFT  02-16-13   3'03-no bypasses -Had 2 aneurysm repairs(stents used)   ESOPHAGOGASTRODUODENOSCOPY  04/2018   LA Grade A esophagitis, small nodule distal esophagus (benign schwann cell hamartoma), empiric dilation to 73m ([facility])   LAPAROTOMY N/A 02/20/2013   Procedure: EXPLORATORY LAPAROTOMY;  WJanie Morning MD   LEFT HEART CATHETERIZATION WITH CORONARY ANGIOGRAM N/A 08/09/2011   Procedure: LEFT HEART CATHETERIZATION WITH CORONARY ANGIOGRAM;  Surgeon: MSanda Klein MD;  [facility];  Service: Cardiovascular;  Laterality: N/A;   NM MYOCAR PERF WALL MOTION  07/20/2010   normal   RADIOLOGY WITH ANESTHESIA N/A 02/22/2019   Procedure: MRI OF ABDOMEN WITH AND WITHOUT CONTRAST;  Surgeon: Radiologist, Medication, MD;  Location: [facility]  Service: Radiology;  Laterality: N/A;   RADIOLOGY WITH ANESTHESIA Bilateral 09/04/2019   Procedure: MRI WITH ANESTHESIA OF ABDOMEN WITH AND WITHOUT CONTRAST, BRAIN WITHOUT CONTRAST;  Surgeon: Radiologist, Medication, MD;  [facility]  Service: Radiology;  Laterality: Bilateral;  SALPINGOOPHORECTOMY Bilateral 02/20/2013   benign seromucinous cystadenofibroma R ovary [name], MD)   TOOTH EXTRACTION     TOTAL HIP ARTHROPLASTY Left 2015    Family History  Problem Relation Age of Onset   Stroke Mother    Hypotension Mother    Dementia Mother 65   Coronary artery disease Father    Hypertension Father    Breast cancer Maternal Aunt    Hypotension Sister    Hypertension Brother    Diabetes Brother    Lung cancer Daughter    Thyroid cancer Daughter    Colon polyps Maternal Grandfather    Colon cancer Neg Hx    Esophageal cancer Neg Hx     Stomach cancer Neg Hx    Rectal cancer Neg Hx     Social History   Socioeconomic History   Marital status: Widowed    Spouse name: Not on file   Number of children: 2   Years of education: 12   Highest education level: High school graduate  Occupational History   Occupation: Retired  Tobacco Use   Smoking status: Former    Packs/day: 0.10    Years: 20.00    Pack years: 2.00    Types: Cigarettes    Quit date: 09/14/2017    Years since quitting: 3.4   Smokeless tobacco: Never   Tobacco comments:    smoking cessation info given  Vaping Use   Vaping Use: Never used  Substance and Sexual Activity   Alcohol use: Yes    Comment: occasional   Drug use: No   Sexual activity: Not on file  Other Topics Concern   Not on file  Social History Narrative   Widow   Lives with daughter [name] and daughter's GF   Edu: HS   Occ: retired, worked at [employer]   Activity: no regular exercise   Diet: good water    Right-handed.   Occasional caffeine use.   Social Determinants of Health   Financial Resource Strain: Not on file  Food Insecurity: Not on file  Transportation Needs: Not on file  Physical Activity: Not on file  Stress: Not on file  Social Connections: Not on file  Intimate Partner Violence: Not on file    Outpatient Medications Prior to Visit  Medication Sig Dispense Refill   albuterol (PROVENTIL) (2.5 MG/3ML) 0.083% nebulizer solution Take 3 mLs (2.5 mg total) by nebulization every 6 (six) hours as needed for wheezing or shortness of breath. 75 mL 1   Alcohol Swabs (B-D SINGLE USE SWABS REGULAR) PADS 1 each by Does not apply route daily. Use as directed to check blood sugar once daily.  Dx code:  E11.65 100 each 0   ascorbic acid (VITAMIN C) 500 MG tablet Take 500 mg by mouth daily.     aspirin EC 81 MG tablet Take 81 mg by mouth daily.     azelastine (ASTELIN) 0.1 % nasal spray Place 1-2 sprays into both nostrils 2 (two) times daily. Use in each nostril as directed 30 mL 3    Blood Glucose Monitoring Suppl (ACCU-CHEK AVIVA PLUS) w/Device KIT 1 each by Does not apply route daily. Use as directed to check blood sugar once daily.  Dx code:  E11.65 1 kit 0   candesartan (ATACAND) 8 MG tablet TAKE 1 TABLET EVERY DAY 90 tablet 3   cholecalciferol (VITAMIN D3) 25 MCG (1000 UNIT) tablet Take 1,000 Units by mouth daily.     Continuous Blood Gluc Receiver (DEXCOM G6  RECEIVER) DEVI by Does not apply route.     Continuous Blood Gluc Sensor (DEXCOM G6 SENSOR) MISC by Does not apply route.     Continuous Blood Gluc Transmit (DEXCOM G6 TRANSMITTER) MISC by Does not apply route.     Cyanocobalamin (B-12) 1000 MCG SUBL Place 1 tablet under the tongue every Monday, Wednesday, and Friday.     furosemide (LASIX) 80 MG tablet TAKE 1 TO 2 TABLETS BY MOUTH DAILY AS DIRECTED 180 tablet 1   glucose blood (ACCU-CHEK AVIVA PLUS) test strip 1 each by Other route as needed for other. Use as instructed to check blood sugar once daily.  Dx code:  E11.65 100 each 0   Insulin Pen Needle (PEN NEEDLES) 33G X 4 MM MISC Use as instructed to administer insulin daily. 100 each 1   loratadine (CLARITIN) 10 MG tablet Take 1 tablet (10 mg total) by mouth daily.     meloxicam (MOBIC) 7.5 MG tablet Take by mouth. As needed     memantine (NAMENDA) 5 MG tablet Take 1 tablet (5 mg total) by mouth 2 (two) times daily. First 4 days take 1 tablet daily 60 tablet 3   morphine (MSIR) 15 MG tablet Take 15 mg by mouth every 6 (six) hours as needed for severe pain.     NOVOLOG MIX 70/30 FLEXPEN (70-30) 100 UNIT/ML FlexPen 22 units in the morning, 32 units in the evening     Potassium Chloride ER 20 MEQ TBCR Take 1-2 tablets daily as directed. 180 tablet 3   QUEtiapine (SEROQUEL) 25 MG tablet Take 1-2 tablets (25-50 mg total) by mouth at bedtime. 180 tablet 1   metFORMIN (GLUCOPHAGE-XR) 500 MG 24 hr tablet TAKE 2 TABLETS EVERY DAY WITH BREAKFAST 180 tablet 2   No facility-administered medications prior to visit.     Allergies  Allergen Reactions   Codeine Anaphylaxis and Shortness Of Breath   Prednisolone Acetate Shortness Of Breath and Nausea Only   Prednisone Hives, Shortness Of Breath and Swelling   Nitroglycerin Other (See Comments)    Reaction not recalled    Gabapentin    Metolazone Other (See Comments)    Hypokalemia, hyponatremia   Other Other (See Comments)   Adhesive [Tape] Rash and Other (See Comments)    SKIN IS VERY SENSITIVE- TEARS AND BRUISES EASILY, also   Crestor [Rosuvastatin Calcium] Other (See Comments)    Weakness with statins   Lopressor [Metoprolol Tartrate] Other (See Comments)    Bradycardia    Mupirocin Rash   Sulfamethoxazole Hives and Swelling   Zetia [Ezetimibe] Other (See Comments)    Weakness     Review of Systems  Constitutional:  Negative for chills and fever.  HENT:  Positive for hearing loss. Negative for ear discharge and ear pain.   Respiratory:  Negative for shortness of breath.   Gastrointestinal:  Negative for nausea and vomiting.  Neurological:  Positive for headaches (not today). Negative for dizziness and light-headedness.      Objective:    Physical Exam Vitals and nursing note reviewed.  Constitutional:      Appearance: Normal appearance.  HENT:     Right Ear: Tympanic membrane, ear canal and external ear normal. There is no impacted cerumen.     Left Ear: Tympanic membrane, ear canal and external ear normal. There is no impacted cerumen.     Ears:     Comments: Verbal consent gotten Patient ears were irrigated using a liquid solution per office policy. Patient tolerated  procedure decently well.  TMs evaluated after irrigation Right TM WNL Left TM reddened but did not look infected    Mouth/Throat:     Mouth: Mucous membranes are moist.     Pharynx: Oropharynx is clear.  Eyes:     Extraocular Movements: Extraocular movements intact.     Pupils: Pupils are equal, round, and reactive to light.  Cardiovascular:     Rate and  Rhythm: Normal rate and regular rhythm.  Pulmonary:     Effort: Pulmonary effort is normal.     Breath sounds: Normal breath sounds.  Musculoskeletal:     Comments: Bilateral upper and lower extremity 5/5  Lymphadenopathy:     Cervical: No cervical adenopathy.  Neurological:     Mental Status: She is alert.     Motor: No weakness.     Deep Tendon Reflexes: Reflexes normal.  Psychiatric:        Mood and Affect: Mood normal.        Behavior: Behavior normal.        Thought Content: Thought content normal.    BP (!) 154/68   Pulse 67   Temp 98.2 F (36.8 C)   Resp 14   Ht 5' (1.524 m)   Wt 136 lb 6 oz (61.9 kg)   SpO2 97%   BMI 26.63 kg/m  Wt Readings from Last 3 Encounters:  02/26/21 136 lb 6 oz (61.9 kg)  06/12/20 146 lb (66.2 kg)  06/05/20 144 lb 3.2 oz (65.4 kg)    Health Maintenance Due  Topic Date Due   Zoster Vaccines- Shingrix (1 of 2) Never done   COVID-19 Vaccine (3 - Moderna risk series) 03/04/2020   HEMOGLOBIN A1C  10/23/2020   OPHTHALMOLOGY EXAM  12/23/2020   INFLUENZA VACCINE  01/12/2021    There are no preventive care reminders to display for this patient.   Lab Results  Component Value Date   TSH 1.37 04/25/2020   Lab Results  Component Value Date   WBC 9.6 12/01/2019   HGB 14.3 12/01/2019   HCT 42.0 12/01/2019   MCV 91.8 12/01/2019   PLT 286 12/01/2019   Lab Results  Component Value Date   NA 134 07/03/2020   K 4.8 07/03/2020   CHLORIDE 105 03/01/2013   CO2 22 07/03/2020   GLUCOSE 122 (H) 07/03/2020   BUN 28 (H) 07/03/2020   CREATININE 1.01 (H) 07/03/2020   BILITOT 0.4 04/25/2020   ALKPHOS 66 04/25/2020   AST 12 04/25/2020   ALT 9 04/25/2020   PROT 6.7 04/25/2020   ALBUMIN 3.9 04/25/2020   CALCIUM 9.4 07/03/2020   ANIONGAP 11 12/01/2019   GFR 73.55 04/25/2020   Lab Results  Component Value Date   CHOL 190 04/25/2020   Lab Results  Component Value Date   HDL 32.00 (L) 04/25/2020   Lab Results  Component Value Date    LDLCALC 124 (H) 04/25/2020   Lab Results  Component Value Date   TRIG 171.0 (H) 04/25/2020   Lab Results  Component Value Date   CHOLHDL 6 04/25/2020   Lab Results  Component Value Date   HGBA1C 6.3 04/25/2020       Assessment & Plan:   Problem List Items Addressed This Visit       Nervous and Auditory   Bilateral hearing loss due to cerumen impaction - Primary    Is with daughter in office with 2 days worth of decreased hearing.  On exam patient had bilateral cerumen impaction.  Cerumen impaction was removed via irrigation.  Patient seems to hear better, daughter thought that patient could hear better as she told her normal tone of voice and patient responded.  Left TM was injected and little red does not look infected.  Did state to the patient and daughter to monitor over the next few days if pain or worsening symptoms occur we will treat for otitis media.      Relevant Orders   Ear Lavage     No orders of the defined types were placed in this encounter.  This visit occurred during the SARS-CoV-2 public health emergency.  Safety protocols were in place, including screening questions prior to the visit, additional usage of staff PPE, and extensive cleaning of exam room while observing appropriate contact time as indicated for disinfecting solutions.   Romilda Garret, NP

## 2021-02-26 NOTE — Assessment & Plan Note (Signed)
Is with daughter in office with 2 days worth of decreased hearing.  On exam patient had bilateral cerumen impaction.  Cerumen impaction was removed via irrigation.  Patient seems to hear better, daughter thought that patient could hear better as she told her normal tone of voice and patient responded.  Left TM was injected and little red does not look infected.  Did state to the patient and daughter to monitor over the next few days if pain or worsening symptoms occur we will treat for otitis media.

## 2021-02-26 NOTE — Patient Instructions (Signed)
Nice to see you today.  Keep an eye on any type of ear pain on the left side, since that ear drum is red. Follow up if symptoms fail to improve.

## 2021-03-12 DIAGNOSIS — I10 Essential (primary) hypertension: Secondary | ICD-10-CM | POA: Diagnosis not present

## 2021-03-12 DIAGNOSIS — M47816 Spondylosis without myelopathy or radiculopathy, lumbar region: Secondary | ICD-10-CM | POA: Diagnosis not present

## 2021-03-12 DIAGNOSIS — F0390 Unspecified dementia without behavioral disturbance: Secondary | ICD-10-CM | POA: Diagnosis not present

## 2021-03-12 DIAGNOSIS — Z6825 Body mass index (BMI) 25.0-25.9, adult: Secondary | ICD-10-CM | POA: Diagnosis not present

## 2021-03-26 ENCOUNTER — Other Ambulatory Visit: Payer: Self-pay | Admitting: Family Medicine

## 2021-04-28 ENCOUNTER — Other Ambulatory Visit: Payer: Self-pay | Admitting: Family Medicine

## 2021-04-28 DIAGNOSIS — E538 Deficiency of other specified B group vitamins: Secondary | ICD-10-CM

## 2021-04-28 DIAGNOSIS — E1169 Type 2 diabetes mellitus with other specified complication: Secondary | ICD-10-CM

## 2021-04-28 DIAGNOSIS — E559 Vitamin D deficiency, unspecified: Secondary | ICD-10-CM

## 2021-04-28 DIAGNOSIS — E118 Type 2 diabetes mellitus with unspecified complications: Secondary | ICD-10-CM

## 2021-04-28 DIAGNOSIS — E0590 Thyrotoxicosis, unspecified without thyrotoxic crisis or storm: Secondary | ICD-10-CM

## 2021-05-01 ENCOUNTER — Other Ambulatory Visit: Payer: Medicare HMO

## 2021-05-04 ENCOUNTER — Encounter: Payer: Self-pay | Admitting: Family Medicine

## 2021-05-04 ENCOUNTER — Other Ambulatory Visit: Payer: Self-pay

## 2021-05-04 ENCOUNTER — Ambulatory Visit (INDEPENDENT_AMBULATORY_CARE_PROVIDER_SITE_OTHER): Payer: Medicare HMO | Admitting: Family Medicine

## 2021-05-04 VITALS — BP 140/78 | HR 102 | Temp 98.1°F | Ht 59.25 in | Wt 134.3 lb

## 2021-05-04 DIAGNOSIS — E118 Type 2 diabetes mellitus with unspecified complications: Secondary | ICD-10-CM

## 2021-05-04 DIAGNOSIS — Z85820 Personal history of malignant melanoma of skin: Secondary | ICD-10-CM

## 2021-05-04 DIAGNOSIS — I5032 Chronic diastolic (congestive) heart failure: Secondary | ICD-10-CM | POA: Diagnosis not present

## 2021-05-04 DIAGNOSIS — Z1231 Encounter for screening mammogram for malignant neoplasm of breast: Secondary | ICD-10-CM

## 2021-05-04 DIAGNOSIS — Z0000 Encounter for general adult medical examination without abnormal findings: Secondary | ICD-10-CM

## 2021-05-04 DIAGNOSIS — K861 Other chronic pancreatitis: Secondary | ICD-10-CM | POA: Diagnosis not present

## 2021-05-04 DIAGNOSIS — Z23 Encounter for immunization: Secondary | ICD-10-CM | POA: Diagnosis not present

## 2021-05-04 DIAGNOSIS — E785 Hyperlipidemia, unspecified: Secondary | ICD-10-CM

## 2021-05-04 DIAGNOSIS — F03918 Unspecified dementia, unspecified severity, with other behavioral disturbance: Secondary | ICD-10-CM

## 2021-05-04 DIAGNOSIS — E538 Deficiency of other specified B group vitamins: Secondary | ICD-10-CM

## 2021-05-04 DIAGNOSIS — E0590 Thyrotoxicosis, unspecified without thyrotoxic crisis or storm: Secondary | ICD-10-CM

## 2021-05-04 DIAGNOSIS — F0391 Unspecified dementia with behavioral disturbance: Secondary | ICD-10-CM | POA: Diagnosis not present

## 2021-05-04 DIAGNOSIS — I110 Hypertensive heart disease with heart failure: Secondary | ICD-10-CM | POA: Diagnosis not present

## 2021-05-04 DIAGNOSIS — E559 Vitamin D deficiency, unspecified: Secondary | ICD-10-CM | POA: Diagnosis not present

## 2021-05-04 DIAGNOSIS — I10 Essential (primary) hypertension: Secondary | ICD-10-CM

## 2021-05-04 DIAGNOSIS — Z794 Long term (current) use of insulin: Secondary | ICD-10-CM

## 2021-05-04 DIAGNOSIS — E1169 Type 2 diabetes mellitus with other specified complication: Secondary | ICD-10-CM | POA: Diagnosis not present

## 2021-05-04 DIAGNOSIS — F05 Delirium due to known physiological condition: Secondary | ICD-10-CM

## 2021-05-04 DIAGNOSIS — J449 Chronic obstructive pulmonary disease, unspecified: Secondary | ICD-10-CM | POA: Diagnosis not present

## 2021-05-04 LAB — LIPID PANEL
Cholesterol: 181 mg/dL (ref 0–200)
HDL: 40.6 mg/dL (ref >39.00)
LDL Cholesterol: 122 mg/dL — ABNORMAL HIGH (ref 0–99)
NonHDL: 140.77
Total CHOL/HDL Ratio: 4
Triglycerides: 95 mg/dL (ref 0.0–149.0)
VLDL: 19 mg/dL (ref 0.0–40.0)

## 2021-05-04 LAB — CBC WITH DIFFERENTIAL/PLATELET
Basophils Absolute: 0 10*3/uL (ref 0.0–0.1)
Basophils Relative: 0.5 % (ref 0.0–3.0)
Eosinophils Absolute: 0.2 10*3/uL (ref 0.0–0.7)
Eosinophils Relative: 2.1 % (ref 0.0–5.0)
HCT: 40.5 % (ref 36.0–46.0)
Hemoglobin: 13.4 g/dL (ref 12.0–15.0)
Lymphocytes Relative: 23.4 % (ref 12.0–46.0)
Lymphs Abs: 1.8 10*3/uL (ref 0.7–4.0)
MCHC: 33.2 g/dL (ref 30.0–36.0)
MCV: 89.6 fl (ref 78.0–100.0)
Monocytes Absolute: 0.6 10*3/uL (ref 0.1–1.0)
Monocytes Relative: 7.4 % (ref 3.0–12.0)
Neutro Abs: 5.2 10*3/uL (ref 1.4–7.7)
Neutrophils Relative %: 66.6 % (ref 43.0–77.0)
Platelets: 291 10*3/uL (ref 150.0–400.0)
RBC: 4.52 Mil/uL (ref 3.87–5.11)
RDW: 13.8 % (ref 11.5–15.5)
WBC: 7.9 10*3/uL (ref 4.0–10.5)

## 2021-05-04 LAB — VITAMIN B12: Vitamin B-12: 1134 pg/mL — ABNORMAL HIGH (ref 211–911)

## 2021-05-04 LAB — COMPREHENSIVE METABOLIC PANEL
ALT: 13 U/L (ref 0–35)
AST: 21 U/L (ref 0–37)
Albumin: 4.2 g/dL (ref 3.5–5.2)
Alkaline Phosphatase: 76 U/L (ref 39–117)
BUN: 12 mg/dL (ref 6–23)
CO2: 28 mEq/L (ref 19–32)
Calcium: 9.7 mg/dL (ref 8.4–10.5)
Chloride: 107 mEq/L (ref 96–112)
Creatinine, Ser: 0.73 mg/dL (ref 0.40–1.20)
GFR: 76.63 mL/min (ref >60.00)
Glucose, Bld: 96 mg/dL (ref 70–99)
Potassium: 5 mEq/L (ref 3.5–5.1)
Sodium: 141 mEq/L (ref 135–145)
Total Bilirubin: 0.4 mg/dL (ref 0.2–1.2)
Total Protein: 6.8 g/dL (ref 6.0–8.3)

## 2021-05-04 LAB — T4, FREE: Free T4: 0.88 ng/dL (ref 0.60–1.60)

## 2021-05-04 LAB — HEMOGLOBIN A1C: Hgb A1c MFr Bld: 5.5 % (ref 4.6–6.5)

## 2021-05-04 LAB — VITAMIN D 25 HYDROXY (VIT D DEFICIENCY, FRACTURES): VITD: 45.65 ng/mL (ref 30.00–100.00)

## 2021-05-04 LAB — TSH: TSH: 0.98 u[IU]/mL (ref 0.35–5.50)

## 2021-05-04 MED ORDER — QUETIAPINE FUMARATE 25 MG PO TABS: 25.0000 mg | ORAL_TABLET | Freq: Every day | ORAL | 3 refills | Status: DC

## 2021-05-04 NOTE — Assessment & Plan Note (Signed)
Update labs of medication. Statin intolerant. Doesn't think could tolerate PCSK9 injections. The ASCVD Risk score (Arnett DK, et al., 2019) failed to calculate for the following reasons:   The 2019 ASCVD risk score is only valid for ages 67 to 65

## 2021-05-04 NOTE — Patient Instructions (Addendum)
Flu shot today  Labs today  We will refer you back to dermatology for skin check.  Call to schedule mammogram at your convenience: [place] [phone] You are doing well today  Return as needed or in 1 year for next wellness visit.   Health Maintenance After Age 82 After age 81, you are at a higher risk for certain long-term diseases and infections as well as injuries from falls. Falls are a major cause of broken bones and head injuries in people who are older than age 37. Getting regular preventive care can help to keep you healthy and well. Preventive care includes getting regular testing and making lifestyle changes as recommended by your health care provider. Talk with your health care provider about: Which screenings and tests you should have. A screening is a test that checks for a disease when you have no symptoms. A diet and exercise plan that is right for you. What should I know about screenings and tests to prevent falls? Screening and testing are the best ways to find a health problem early. Early diagnosis and treatment give you the best chance of managing medical conditions that are common after age 55. Certain conditions and lifestyle choices may make you more likely to have a fall. Your health care provider may recommend: Regular vision checks. Poor vision and conditions such as cataracts can make you more likely to have a fall. If you wear glasses, make sure to get your prescription updated if your vision changes. Medicine review. Work with your health care provider to regularly review all of the medicines you are taking, including over-the-counter medicines. Ask your health care provider about any side effects that may make you more likely to have a fall. Tell your health care provider if any medicines that you take make you feel dizzy or sleepy. Strength and balance checks. Your health care provider may recommend certain tests to check your strength and  balance while standing, walking, or changing positions. Foot health exam. Foot pain and numbness, as well as not wearing proper footwear, can make you more likely to have a fall. Screenings, including: Osteoporosis screening. Osteoporosis is a condition that causes the bones to get weaker and break more easily. Blood pressure screening. Blood pressure changes and medicines to control blood pressure can make you feel dizzy. Depression screening. You may be more likely to have a fall if you have a fear of falling, feel depressed, or feel unable to do activities that you used to do. Alcohol use screening. Using too much alcohol can affect your balance and may make you more likely to have a fall. Follow these instructions at home: Lifestyle Do not drink alcohol if: Your health care provider tells you not to drink. If you drink alcohol: Limit how much you have to: 0-1 drink a day for women. 0-2 drinks a day for men. Know how much alcohol is in your drink. In the [place] one drink equals one 12 oz bottle of beer (355 mL), one 5 oz glass of wine (148 mL), or one 1 oz glass of hard liquor (44 mL). Do not use any products that contain nicotine or tobacco. These products include cigarettes, chewing tobacco, and vaping devices, such as e-cigarettes. If you need help quitting, ask your health care provider. Activity  Follow a regular exercise program to stay fit. This will help you maintain your balance. Ask your health care provider what types of exercise are appropriate for you. If you need  a cane or walker, use it as recommended by your health care provider. Wear supportive shoes that have nonskid soles. Safety  Remove any tripping hazards, such as rugs, cords, and clutter. Install safety equipment such as grab bars in bathrooms and safety rails on stairs. Keep rooms and walkways well-lit. General instructions Talk with your health care provider about your risks for falling. Tell your health care  provider if: You fall. Be sure to tell your health care provider about all falls, even ones that seem minor. You feel dizzy, tiredness (fatigue), or off-balance. Take over-the-counter and prescription medicines only as told by your health care provider. These include supplements. Eat a healthy diet and maintain a healthy weight. A healthy diet includes low-fat dairy products, low-fat (lean) meats, and fiber from whole grains, beans, and lots of fruits and vegetables. Stay current with your vaccines. Schedule regular health, dental, and eye exams. Summary Having a healthy lifestyle and getting preventive care can help to protect your health and wellness after age 73. Screening and testing are the best way to find a health problem early and help you avoid having a fall. Early diagnosis and treatment give you the best chance for managing medical conditions that are more common for people who are older than age 58. Falls are a major cause of broken bones and head injuries in people who are older than age 43. Take precautions to prevent a fall at home. Work with your health care provider to learn what changes you can make to improve your health and wellness and to prevent falls. This information is not intended to replace advice given to you by your health care provider. Make sure you discuss any questions you have with your health care provider. Document Revised: 10/20/2020 Document Reviewed: 10/20/2020 Elsevier Patient Education  [place].

## 2021-05-04 NOTE — Assessment & Plan Note (Signed)
Update levels on MWF replacement.

## 2021-05-04 NOTE — Assessment & Plan Note (Signed)
Refer back to derm for skin check.

## 2021-05-04 NOTE — Assessment & Plan Note (Signed)
Followed by Dr Chalmers Cater endocrinology, has CGM.

## 2021-05-04 NOTE — Assessment & Plan Note (Signed)

## 2021-05-04 NOTE — Assessment & Plan Note (Signed)
Off namenda. Daughter is caregiver who she lives with.

## 2021-05-04 NOTE — Assessment & Plan Note (Signed)
Chronic, stable on current regimen including candesartan and lasix PRN.

## 2021-05-04 NOTE — Assessment & Plan Note (Signed)
Update levels on 1000 IU daily.  

## 2021-05-04 NOTE — Assessment & Plan Note (Signed)
Avoiding GLP1RA due to this.

## 2021-05-04 NOTE — Assessment & Plan Note (Signed)
Continue seroquel nightly - good effect

## 2021-05-04 NOTE — Assessment & Plan Note (Signed)
Update TFTs.  Sees Dr Chalmers Cater.

## 2021-05-04 NOTE — Progress Notes (Signed)
Patient ID: Meredith Gutierrez, female    DOB: January 02, 1941, 80 y.o.   MRN: [id number]  This visit was conducted in person.  BP 140/78   Pulse (!) 102   Temp 98.1 F (36.7 C) (Temporal)   Ht 4' 11.25" (1.505 m)   Wt 134 lb 5 oz (60.9 kg)   SpO2 93%   BMI 26.90 kg/m    CC: AMW Subjective:   HPI: Meredith Gutierrez is a 80 y.o. female presenting on 05/04/2021 for Medicare Wellness (Pt accompanied by daughter, [name]. )   Did not see health advisor this year.   Hearing Screening - Comments:: Pt not able to comprehend instructions. Vision Screening - Comments:: Last eye exam, 09/2020.  [facility] Office Visit from 08/13/2019 in [facility]  PHQ-2 Total Score 0       Fall Risk  05/04/2021 08/13/2019 04/23/2019 04/07/2018 04/06/2017  Falls in the past year? 0 1 0 No No  Number falls in past yr: - 1 0 - -  Injury with Fall? - 0 0 - -  Risk for fall due to : - - Medication side effect - -  Follow up - - Falls evaluation completed;Falls prevention discussed - -   No longer on namenda.   Preventative: Colon cancer screening - cologuard negative 04/2017. Aged out.  Breast cancer screening - mammo 03/2015 WNL. Due for rpt. Has been to [facility] previously.  Well woman exam - s/p hysterectomy, ovaries removed age 74, s/p HRT.  DEXA scan - 2017, normal per daughter done by Dr Chalmers Cater, will request records. Lung cancer screening - not eligible. 1 pack/wk x about 30 yrs.  Flu shot yearly  COVID vaccine - Moderna 77/2021, 01/2020, no boosters Pneumovax 2010, 2020, [id number] 2016 zostavax 2015 shingrix - discussed, declines  Advanced directive: living will/HCPOA scanned and in chart 03/2017. HCPOA are daughter [name] and son Charlotte Crumb. Does not want prolonged life support if terminal condition.  Seat belt use discussed  Sunscreen use discussed, no changing moles on skin. H/o melanoma. Rec yearly derm eval.  Ex-smoker - 1 pack per week, quit about 10 yrs  ago Alcohol - none Bowel - occasional constipation - due to morphine - manages with miralax and stool stoftener prn Bladder - no incontinence   Widow Lives with daughter and daughter's partner Edu: HS Occ: retired, worked at [employer] Activity: no regular exercise Diet: good water      Relevant past medical, surgical, family and social history reviewed and updated as indicated. Interim medical history since our last visit reviewed. Allergies and medications reviewed and updated. Outpatient Medications Prior to Visit  Medication Sig Dispense Refill   albuterol (PROVENTIL) (2.5 MG/3ML) 0.083% nebulizer solution Take 3 mLs (2.5 mg total) by nebulization every 6 (six) hours as needed for wheezing or shortness of breath. 75 mL 1   Alcohol Swabs (B-D SINGLE USE SWABS REGULAR) PADS 1 each by Does not apply route daily. Use as directed to check blood sugar once daily.  Dx code:  E11.65 100 each 0   ascorbic acid (VITAMIN C) 500 MG tablet Take 500 mg by mouth daily.     aspirin EC 81 MG tablet Take 81 mg by mouth daily.     Blood Glucose Monitoring Suppl (ACCU-CHEK AVIVA PLUS) w/Device KIT 1 each by Does not apply route daily. Use as directed to check blood sugar once daily.  Dx code:  E11.65 1 kit 0   candesartan (ATACAND) 8  MG tablet TAKE 1 TABLET EVERY DAY 90 tablet 0   cholecalciferol (VITAMIN D3) 25 MCG (1000 UNIT) tablet Take 1,000 Units by mouth daily.     Continuous Blood Gluc Receiver ([place]) DEVI by Does not apply route.     Continuous Blood Gluc Sensor (DEXCOM G6 SENSOR) MISC by Does not apply route.     Continuous Blood Gluc Transmit (DEXCOM G6 TRANSMITTER) MISC by Does not apply route.     Cyanocobalamin (B-12) 1000 MCG SUBL Place 1 tablet under the tongue every Monday, Wednesday, and Friday.     furosemide (LASIX) 80 MG tablet TAKE 1 TO 2 TABLETS BY MOUTH DAILY AS DIRECTED (Patient taking differently: TAKE 1 TO 2 TABLETS BY MOUTH DAILY AS DIRECTED AS NEEDED) 180 tablet 1    glucose blood (ACCU-CHEK AVIVA PLUS) test strip 1 each by Other route as needed for other. Use as instructed to check blood sugar once daily.  Dx code:  E11.65 100 each 0   Insulin Pen Needle (PEN NEEDLES) 33G X 4 MM MISC Use as instructed to administer insulin daily. 100 each 1   loratadine (CLARITIN) 10 MG tablet Take 1 tablet (10 mg total) by mouth daily.     meloxicam (MOBIC) 7.5 MG tablet Take by mouth. As needed     morphine (MSIR) 15 MG tablet Take 15 mg by mouth every 6 (six) hours as needed for severe pain.     NOVOLOG MIX 70/30 FLEXPEN (70-30) 100 UNIT/ML FlexPen 22 units in the morning, 32 units in the evening     Potassium Chloride ER 20 MEQ TBCR Take 1-2 tablets daily as directed. (Patient taking differently: Take 1-2 tablets daily as directed as needed) 180 tablet 3   memantine (NAMENDA) 5 MG tablet Take 1 tablet (5 mg total) by mouth 2 (two) times daily. First 4 days take 1 tablet daily (Patient taking differently: Take 5 mg by mouth 2 (two) times daily. First 4 days take 1 tablet daily as needed) 60 tablet 3   QUEtiapine (SEROQUEL) 25 MG tablet Take 1-2 tablets (25-50 mg total) by mouth at bedtime. 180 tablet 1   azelastine (ASTELIN) 0.1 % nasal spray Place 1-2 sprays into both nostrils 2 (two) times daily. Use in each nostril as directed 30 mL 3   No facility-administered medications prior to visit.     Per HPI unless specifically indicated in ROS section below Review of Systems  Constitutional:  Positive for appetite change. Negative for activity change, chills, fatigue, fever and unexpected weight change.  HENT:  Negative for hearing loss.   Eyes:  Negative for visual disturbance.  Respiratory:  Negative for cough, chest tightness, shortness of breath and wheezing.   Cardiovascular:  Negative for chest pain, palpitations and leg swelling.  Gastrointestinal:  Positive for constipation. Negative for abdominal distention, abdominal pain, blood in stool, diarrhea, nausea and  vomiting.  Genitourinary:  Negative for difficulty urinating and hematuria.  Musculoskeletal:  Negative for arthralgias, myalgias and neck pain.  Skin:  Negative for rash.  Neurological:  Negative for dizziness, seizures, syncope and headaches.  Hematological:  Negative for adenopathy. Does not bruise/bleed easily.  Psychiatric/Behavioral:  Negative for dysphoric mood. The patient is not nervous/anxious.    Objective:  BP 140/78   Pulse (!) 102   Temp 98.1 F (36.7 C) (Temporal)   Ht 4' 11.25" (1.505 m)   Wt 134 lb 5 oz (60.9 kg)   SpO2 93%   BMI 26.90 kg/m  Wt Readings from Last 3 Encounters:  05/04/21 134 lb 5 oz (60.9 kg)  02/26/21 136 lb 6 oz (61.9 kg)  06/12/20 146 lb (66.2 kg)      Physical Exam Vitals and nursing note reviewed.  Constitutional:      Appearance: Normal appearance. She is not ill-appearing.  HENT:     Head: Normocephalic and atraumatic.     Right Ear: Tympanic membrane, ear canal and external ear normal. There is no impacted cerumen.     Left Ear: Tympanic membrane, ear canal and external ear normal. There is no impacted cerumen.  Eyes:     General:        Right eye: No discharge.        Left eye: No discharge.     Extraocular Movements: Extraocular movements intact.     Conjunctiva/sclera: Conjunctivae normal.     Pupils: Pupils are equal, round, and reactive to light.  Neck:     Thyroid: No thyroid mass or thyromegaly.  Cardiovascular:     Rate and Rhythm: Normal rate and regular rhythm.     Pulses: Normal pulses.     Heart sounds: Normal heart sounds. No murmur heard. Pulmonary:     Effort: Pulmonary effort is normal. No respiratory distress.     Breath sounds: Normal breath sounds. No wheezing, rhonchi or rales.  Abdominal:     General: Bowel sounds are normal. There is no distension.     Palpations: Abdomen is soft. There is no mass.     Tenderness: There is no abdominal tenderness. There is no guarding or rebound.     Hernia: No hernia  is present.  Musculoskeletal:     Cervical back: Normal range of motion and neck supple. No rigidity.     Right lower leg: No edema.     Left lower leg: No edema.  Lymphadenopathy:     Cervical: No cervical adenopathy.  Skin:    General: Skin is warm and dry.     Findings: No rash.  Neurological:     General: No focal deficit present.     Mental Status: She is alert. Mental status is at baseline.     Comments: Known dementia  Psychiatric:        Mood and Affect: Mood normal.        Behavior: Behavior normal.      Results for orders placed or performed in visit on 54/09/81  Basic metabolic panel  Result Value Ref Range   Glucose 122 (H) 65 - 99 mg/dL   BUN 28 (H) 8 - 27 mg/dL   Creatinine, Ser 1.01 (H) 0.57 - 1.00 mg/dL   GFR calc non Af Amer 52 (L) >59 mL/min/1.73   GFR calc Af Amer 60 >59 mL/min/1.73   BUN/Creatinine Ratio 28 12 - 28   Sodium 134 134 - 144 mmol/L   Potassium 4.8 3.5 - 5.2 mmol/L   Chloride 98 96 - 106 mmol/L   CO2 22 20 - 29 mmol/L   Calcium 9.4 8.7 - 10.3 mg/dL    Assessment & Plan:  This visit occurred during the SARS-CoV-2 public health emergency.  Safety protocols were in place, including screening questions prior to the visit, additional usage of staff PPE, and extensive cleaning of exam room while observing appropriate contact time as indicated for disinfecting solutions.   Problem List Items Addressed This Visit     Essential hypertension (Chronic)    Chronic, stable on current regimen including candesartan and  lasix PRN.       Chronic diastolic (congestive) heart failure (HCC) (Chronic)    Seems euvolemic. Stable period on PRN lasix.       Health maintenance examination (Chronic)    Preventative protocols reviewed and updated unless pt declined. Discussed healthy diet and lifestyle.       Medicare annual wellness visit, subsequent - Primary (Chronic)    I have personally reviewed the Medicare Annual Wellness questionnaire and have  noted 1. The patient's medical and social history 2. Their use of alcohol, tobacco or illicit drugs 3. Their current medications and supplements 4. The patient's functional ability including ADL's, fall risks, home safety risks and hearing or visual impairment. Cognitive function has been assessed and addressed as indicated.  5. Diet and physical activity 6. Evidence for depression or mood disorders The patients weight, height, BMI have been recorded in the chart. I have made referrals, counseling and provided education to the patient based on review of the above and I have provided the pt with a written personalized care plan for preventive services. Provider list updated.. See scanned questionairre as needed for further documentation. Reviewed preventative protocols and updated unless pt declined.       Dyslipidemia (statin intolerance) associated with type 2 diabetes mellitus ([place])    Update labs of medication. Statin intolerant. Doesn't think could tolerate PCSK9 injections. The ASCVD Risk score (Arnett DK, et al., 2019) failed to calculate for the following reasons:   The 2019 ASCVD risk score is only valid for ages 28 to 46       Hyperthyroidism    Update TFTs.  Sees Dr Chalmers Cater.       Dementia, old age, with behavioral disturbance    Off namenda. Daughter is caregiver who she lives with.       Relevant Medications   QUEtiapine (SEROQUEL) 25 MG tablet   History of melanoma    Refer back to derm for skin check.       Relevant Orders   Ambulatory referral to Dermatology   Vitamin D deficiency    Update levels on 1000 IU daily.       Sundowning    Continue seroquel nightly - good effect      Controlled diabetes mellitus type 2 with complications ([place])    Followed by Dr Chalmers Cater endocrinology, has CGM.       Chronic pancreatitis ([place])    Avoiding GLP1RA due to this.       Low serum vitamin B12    Update levels on MWF replacement.       Other Visit Diagnoses      Need for influenza vaccination       Relevant Orders   Flu Vaccine QUAD High Dose(Fluad) (Completed)   Breast cancer screening by mammogram       Relevant Orders   MM 3D SCREEN BREAST BILATERAL        Meds ordered this encounter  Medications   QUEtiapine (SEROQUEL) 25 MG tablet    Sig: Take 1-2 tablets (25-50 mg total) by mouth at bedtime.    Dispense:  180 tablet    Refill:  3    Orders Placed This Encounter  Procedures   MM 3D SCREEN BREAST BILATERAL    Standing Status:   Future    Standing Expiration Date:   05/04/2022    Order Specific Question:   Reason for Exam (SYMPTOM  OR DIAGNOSIS REQUIRED)    Answer:   screening mammo  Order Specific Question:   Preferred imaging location?    Answer:   [facility]   Flu Vaccine QUAD High Dose(Fluad)   Ambulatory referral to Dermatology    Referral Priority:   Routine    Referral Type:   Consultation    Referral Reason:   Specialty Services Required    Requested Specialty:   Dermatology    Number of Visits Requested:   1     Patient instructions: Flu shot today  Labs today  We will refer you back to dermatology for skin check.  Call to schedule mammogram at your convenience: [place] [phone] You are doing well today  Return as needed or in 1 year for next wellness visit.   Follow up plan: Return in about 1 year (around 05/04/2022) for annual exam, prior fasting for blood work, medicare wellness visit.  Ria Bush, MD

## 2021-05-04 NOTE — Assessment & Plan Note (Signed)
Seems euvolemic. Stable period on PRN lasix.

## 2021-05-04 NOTE — Assessment & Plan Note (Signed)
Preventative protocols reviewed and updated unless pt declined. Discussed healthy diet and lifestyle.  

## 2021-05-11 ENCOUNTER — Other Ambulatory Visit: Payer: Self-pay | Admitting: Family Medicine

## 2021-05-11 MED ORDER — B-12 1000 MCG SL SUBL
1.0000 | SUBLINGUAL_TABLET | SUBLINGUAL | Status: DC
Start: 2021-05-11 — End: 2022-07-23

## 2021-05-13 DIAGNOSIS — D225 Melanocytic nevi of trunk: Secondary | ICD-10-CM | POA: Diagnosis not present

## 2021-05-13 DIAGNOSIS — L821 Other seborrheic keratosis: Secondary | ICD-10-CM | POA: Diagnosis not present

## 2021-05-13 DIAGNOSIS — L820 Inflamed seborrheic keratosis: Secondary | ICD-10-CM | POA: Diagnosis not present

## 2021-05-15 ENCOUNTER — Other Ambulatory Visit: Payer: Self-pay | Admitting: Family Medicine

## 2021-05-18 DIAGNOSIS — E1165 Type 2 diabetes mellitus with hyperglycemia: Secondary | ICD-10-CM | POA: Diagnosis not present

## 2021-06-23 ENCOUNTER — Ambulatory Visit: Payer: Medicare HMO

## 2021-06-29 DIAGNOSIS — F0390 Unspecified dementia without behavioral disturbance: Secondary | ICD-10-CM | POA: Diagnosis not present

## 2021-06-29 DIAGNOSIS — F1120 Opioid dependence, uncomplicated: Secondary | ICD-10-CM | POA: Diagnosis not present

## 2021-06-29 DIAGNOSIS — M47816 Spondylosis without myelopathy or radiculopathy, lumbar region: Secondary | ICD-10-CM | POA: Diagnosis not present

## 2021-08-06 DIAGNOSIS — E1165 Type 2 diabetes mellitus with hyperglycemia: Secondary | ICD-10-CM | POA: Diagnosis not present

## 2021-08-20 DIAGNOSIS — E0590 Thyrotoxicosis, unspecified without thyrotoxic crisis or storm: Secondary | ICD-10-CM | POA: Diagnosis not present

## 2021-08-20 DIAGNOSIS — E1165 Type 2 diabetes mellitus with hyperglycemia: Secondary | ICD-10-CM | POA: Diagnosis not present

## 2021-08-20 DIAGNOSIS — E139 Other specified diabetes mellitus without complications: Secondary | ICD-10-CM | POA: Diagnosis not present

## 2021-09-07 DIAGNOSIS — M47816 Spondylosis without myelopathy or radiculopathy, lumbar region: Secondary | ICD-10-CM | POA: Diagnosis not present

## 2021-09-07 DIAGNOSIS — F0390 Unspecified dementia without behavioral disturbance: Secondary | ICD-10-CM | POA: Diagnosis not present

## 2021-09-07 DIAGNOSIS — F1120 Opioid dependence, uncomplicated: Secondary | ICD-10-CM | POA: Diagnosis not present

## 2021-10-05 ENCOUNTER — Other Ambulatory Visit: Payer: Self-pay | Admitting: Family Medicine

## 2021-10-07 NOTE — Telephone Encounter (Signed)
Seroquel ?Last filled:  03/03/21, #180 ?Last OV:  05/04/21, AWV ?Next OV:  05/11/22, AWV ?

## 2021-10-07 NOTE — Telephone Encounter (Signed)
ERx 

## 2021-10-09 ENCOUNTER — Encounter: Payer: Self-pay | Admitting: Cardiovascular Disease

## 2021-10-09 ENCOUNTER — Ambulatory Visit: Payer: Medicare HMO | Admitting: Cardiovascular Disease

## 2021-10-09 VITALS — BP 142/82 | HR 66 | Ht 60.0 in | Wt 136.0 lb

## 2021-10-09 DIAGNOSIS — E139 Other specified diabetes mellitus without complications: Secondary | ICD-10-CM

## 2021-10-09 DIAGNOSIS — G720 Drug-induced myopathy: Secondary | ICD-10-CM

## 2021-10-09 DIAGNOSIS — E785 Hyperlipidemia, unspecified: Secondary | ICD-10-CM

## 2021-10-09 DIAGNOSIS — I150 Renovascular hypertension: Secondary | ICD-10-CM | POA: Diagnosis not present

## 2021-10-09 DIAGNOSIS — Z86718 Personal history of other venous thrombosis and embolism: Secondary | ICD-10-CM

## 2021-10-09 DIAGNOSIS — T466X5A Adverse effect of antihyperlipidemic and antiarteriosclerotic drugs, initial encounter: Secondary | ICD-10-CM

## 2021-10-09 DIAGNOSIS — I5032 Chronic diastolic (congestive) heart failure: Secondary | ICD-10-CM

## 2021-10-09 DIAGNOSIS — I25810 Atherosclerosis of coronary artery bypass graft(s) without angina pectoris: Secondary | ICD-10-CM | POA: Diagnosis not present

## 2021-10-09 DIAGNOSIS — I773 Arterial fibromuscular dysplasia: Secondary | ICD-10-CM | POA: Diagnosis not present

## 2021-10-09 DIAGNOSIS — F03918 Unspecified dementia, unspecified severity, with other behavioral disturbance: Secondary | ICD-10-CM

## 2021-10-09 DIAGNOSIS — I700 Atherosclerosis of aorta: Secondary | ICD-10-CM | POA: Diagnosis not present

## 2021-10-09 MED ORDER — CANDESARTAN CILEXETIL 4 MG PO TABS: 4.0000 mg | ORAL_TABLET | Freq: Every day | ORAL | 11 refills | Status: DC

## 2021-10-09 NOTE — Progress Notes (Signed)
? ?Cardiololgy Office Note  ? ?Date:  10/10/2021  ? ?ID:  Meredith Gutierrez, Meredith Gutierrez April 05, 1941, MRN [id number] ? ? ?PCP:  Ria Bush, MD  ?Cardiologist:  Sanda Klein, MD  ?Electrophysiologist:  None  ? ?Evaluation Performed:  Follow-Up Visit ? ?Chief Complaint: CHF, CAD, hypertension ? ?History of Present Illness:   ? ?Meredith Gutierrez is a 81 y.o. female with longstanding systemic hypertension,  CAD S/P CABG (2003 LIMA to LAD and SVG to first diagonal, ligation of large LAD artery aneurysm, atretic LIMA with patent LAD at cardiac cath in 2013), without coronary events since that time. She has hyperdynamic left ventricular systolic function, moderate LVH, chronic diastolic heart failure, right renal artery stenosis, hyperlipidemia intolerant to numerous attempts at lipid lowering therapy (rosuvastatin, simvastatin, Zetia and others).  She has well-controlled "type 1.5" diabetes mellitus.  She is unable to take beta blockers due to bradycardia.  She took anticoagulants for a provoked DVT during COVID infection in 2021, no longer on treatment. ? ?She has had slowly progressive dementia.  She is accompanied by her daughter and medical power of attorney, Meredith Gutierrez. ? ?She seems to be doing much better than last time we met.  She is no longer receiving opiates and is much more alert and engaged, although clearly still has issues with memory.  She has lost weight and is almost at optimal weight with a BMI of 26.  She is taking a lower dose of medications for blood pressure after losing weight.  Once Meredith Gutierrez took control of her sodium intake, Meredith Gutierrez has no longer required any diuretics in many months.  She rarely requires NSAIDs.   ? ?Glycemic control has been exceptionally good (may be too good with recent hemoglobin A1c of 5.4%).  She was unable to take SGLT2 inhibitors due to recurrent urinary tract infections.  She is currently on insulin 70/30. ? ?Her lipid profile performed in November showed LDL cholesterol of  122 and HDL of 40 despite well-controlled glucose levels. ? ?The patient specifically denies any chest pain at rest exertion, dyspnea at rest or with exertion, orthopnea, paroxysmal nocturnal dyspnea, syncope, palpitations, focal neurological deficits, intermittent claudication, lower extremity edema, unexplained weight gain, cough, hemoptysis or wheezing. ? ? ? ? ? ?Past Medical History:  ?Diagnosis Date  ? Angina   ? never needed to take nitroglycerin.  ? Anxiety   ? Blood transfusion without reported diagnosis   ? Cerebral atherosclerosis   ? Community acquired pneumonia of left lower lobe of lung 12/20/2018  ? COPD (chronic obstructive pulmonary disease) ([place])   ? 02-16-13-"pt. denies this"-shows on CXR of 2- 2013.  ? Coronary artery disease 2003  ? s/p CABG 2014  ? COVID-19 virus infection 06/2019  ? Covid-19 PNA  ? Dementia ([place])   ? Diabetes mellitus without complication ([place]) 40/9811  ? NEW ONSET  ? Diastolic CHF, on admit treated. 08/10/2011  ? Family history of adverse reaction to anesthesia   ? Dad and Sister had trouble waking up  ? Fibromuscular dysplasia of renal artery (HCC)   ? right  ? Heart murmur   ? History of kidney stones 02-16-13  ? "hx. of overgrowth of muscle-causing some stricture"renal artery stenosis-being followed [place]  ? Hyperlipidemia   ? Hypertension   ? Hyperthyroidism   ? [place]  ? Hyponatremia 04/2018  ? Melanoma ([place]) 2010  ? skin cancers, 1 melanoma removed back  ? Osteoarthritis   ? Pneumonia due to COVID-19 virus 06/26/2019  ? ?Past  Surgical History:  ?Procedure Laterality Date  ? ABDOMINAL HYSTERECTOMY  1970  ? heavy bleeding  ? APPENDECTOMY  1960s  ? CARDIAC CATHETERIZATION  08/09/2011  ? no sign. coronary obstruction. LIMA is atretic w/excellent flow down the native LAD  ? CARDIAC CATHETERIZATION  08/22/2001  ? no sign. coronary stenosis,  ? CATARACT EXTRACTION, BILATERAL Bilateral   ? CESAREAN SECTION    ? CORONARY ARTERY BYPASS GRAFT  02-16-13  ? 3'03-no bypasses -Had 2 aneurysm  repairs(stents used)  ? ESOPHAGOGASTRODUODENOSCOPY  04/2018  ? LA Grade A esophagitis, small nodule distal esophagus (benign schwann cell hamartoma), empiric dilation to 58m ([place])  ? LAPAROTOMY N/A 02/20/2013  ? Procedure: EXPLORATORY LAPAROTOMY;  WJanie Morning MD  ? LEFT HEART CATHETERIZATION WITH CORONARY ANGIOGRAM N/A 08/09/2011  ? Procedure: LEFT HEART CATHETERIZATION WITH CORONARY ANGIOGRAM;  Surgeon: MSanda Klein MD;  [facility];  Service: Cardiovascular;  Laterality: N/A;  ? NM MYOCAR PERF WALL MOTION  07/20/2010  ? normal  ? RADIOLOGY WITH ANESTHESIA N/A 02/22/2019  ? Procedure: MRI OF ABDOMEN WITH AND WITHOUT CONTRAST;  Surgeon: Radiologist, Medication, MD;  [facility]  Service: Radiology;  Laterality: N/A;  ? RADIOLOGY WITH ANESTHESIA Bilateral 09/04/2019  ? Procedure: MRI WITH ANESTHESIA OF ABDOMEN WITH AND WITHOUT CONTRAST, BRAIN WITHOUT CONTRAST;  Surgeon: Radiologist, Medication, MD;  [facility]  Service: Radiology;  Laterality: Bilateral;  ? SALPINGOOPHORECTOMY Bilateral 02/20/2013  ? benign seromucinous cystadenofibroma R ovary ([name] MD)  ? TOOTH EXTRACTION    ? TOTAL HIP ARTHROPLASTY Left 2015  ?  ? ?Current Meds  ?Medication Sig  ? albuterol (PROVENTIL) (2.5 MG/3ML) 0.083% nebulizer solution Take 3 mLs (2.5 mg total) by nebulization every 6 (six) hours as needed for wheezing or shortness of breath.  ? Alcohol Swabs (B-D SINGLE USE SWABS REGULAR) PADS 1 each by Does not apply route daily. Use as directed to check blood sugar once daily.  Dx code:  E11.65  ? ascorbic acid (VITAMIN C) 500 MG tablet Take 500 mg by mouth daily.  ? aspirin EC 81 MG tablet Take 81 mg by mouth daily.  ? Blood Glucose Monitoring Suppl (ACCU-CHEK AVIVA PLUS) w/Device KIT 1 each by Does not apply route daily. Use as directed to check blood sugar once daily.  Dx code:  E11.65  ? candesartan (ATACAND) 4 MG tablet Take 1 tablet (4 mg total) by mouth daily.  ? cholecalciferol (VITAMIN D3) 25  MCG (1000 UNIT) tablet Take 1,000 Units by mouth daily.  ? Continuous Blood Gluc Receiver ([place] DEVI by Does not apply route.  ? Continuous Blood Gluc Sensor (DEXCOM G6 SENSOR) MISC by Does not apply route.  ? Continuous Blood Gluc Transmit (DEXCOM G6 TRANSMITTER) MISC by Does not apply route.  ? Cyanocobalamin (B-12) 1000 MCG SUBL Place 1 tablet under the tongue once a week.  ? glucose blood (ACCU-CHEK AVIVA PLUS) test strip 1 each by Other route as needed for other. Use as instructed to check blood sugar once daily.  Dx code:  E11.65  ? Insulin Pen Needle (PEN NEEDLES) 33G X 4 MM MISC Use as instructed to administer insulin daily.  ? loratadine (CLARITIN) 10 MG tablet Take 1 tablet (10 mg total) by mouth daily.  ? morphine (MS CONTIN) 15 MG 12 hr tablet Take 15 mg by mouth daily.  ? NOVOLOG MIX 70/30 FLEXPEN (70-30) 100 UNIT/ML FlexPen 22 units in the morning, 32 units in the evening  ? [DISCONTINUED] candesartan (ATACAND)  8 MG tablet TAKE 1 TABLET EVERY DAY  ?  ? ?Allergies:   Codeine, Prednisolone acetate, Prednisone, Nitroglycerin, Gabapentin, Metolazone, Other, Adhesive [tape], Crestor [rosuvastatin calcium], Lopressor [metoprolol tartrate], Mupirocin, Sulfamethoxazole, and Zetia [ezetimibe]  ? ?Social History  ? ?Tobacco Use  ? Smoking status: Former  ?  Packs/day: 0.10  ?  Years: 20.00  ?  Pack years: 2.00  ?  Types: Cigarettes  ?  Quit date: 09/14/2017  ?  Years since quitting: 4.0  ? Smokeless tobacco: Never  ? Tobacco comments:  ?  smoking cessation info given  ?Vaping Use  ? Vaping Use: Never used  ?Substance Use Topics  ? Alcohol use: Yes  ?  Comment: occasional  ? Drug use: No  ?  ? ?Family Hx: ?The patient's family history includes Breast cancer in her maternal aunt; Colon polyps in her maternal grandfather; Coronary artery disease in her father; Dementia (age of onset: 16) in her mother; Diabetes in her brother; Hypertension in her brother and father; Hypotension in her mother and  sister; Lung cancer in her daughter; Stroke in her mother; Thyroid cancer in her daughter. There is no history of Colon cancer, Esophageal cancer, Stomach cancer, or Rectal cancer. ? ?ROS:   ?Please see the hist

## 2021-10-09 NOTE — Patient Instructions (Signed)
Medication Instructions:  ?Dr. Sallyanne Kuster recommends Repatha or Praluent (PCSK9). This is an injectable cholesterol medication. This medication will need prior approval with your insurance company, which we will work on. If the medication is not approved initially, we may need to do an appeal with your insurance. We will keep you updated on this process. This medication can be provided at some local pharmacies or be shipped to you from a specialty pharmacy.  ? ?*If you need a refill on your cardiac medications before your next appointment, please call your pharmacy* ? ? ?Lab Work: ?Your provider would like for you to return in the next week to have the following labs drawn: Fasting Lipid. You do not need an appointment for the lab. Once in our office lobby there is a podium where you can sign in and ring the doorbell to alert [place] that you are here. The lab is open from 8:00 am to 4 pm; closed for lunch from 12:45pm-1:45pm. ? ?You may also go to any of these [facility] locations: ?  ?[place] ?- [place] ([facility]) ?- [address] ?- [address] ?If you have labs (blood work) drawn today and your tests are completely normal, you will receive your results only by: ?MyChart Message (if you have MyChart) OR ?A paper copy in the mail ?If you have any lab test that is abnormal or we need to change your treatment, we will call you to review the results. ? ? ?Testing/Procedures: ?None ordered ? ? ?Follow-Up: ?At [facility], you and your health needs are our priority.  As part of our continuing mission to provide you with exceptional heart care, we have created designated Provider Care Teams.  These Care Teams include your primary Cardiologist (physician) and Advanced Practice Providers (APPs -  Physician Assistants and Nurse Practitioners) who all work together to provide you with the care you need, when you need it. ? ?We recommend signing up for the  patient portal called "MyChart".  Sign up information is provided on this After Visit Summary.  MyChart is used to connect with patients for Virtual Visits (Telemedicine).  Patients are able to view lab/test results, encounter notes, upcoming appointments, etc.  Non-urgent messages can be sent to your provider as well.   ?To learn more about what you can do with MyChart, go to [email or web address].   ? ?Your next appointment:   ?12 month(s) ? ?The format for your next appointment:   ?In Person ? ?Provider:   ?Sanda Klein, MD { ? ? ?Other Instructions ?Appointment with pharmD to discuss starting [place] or [place] ? ?

## 2021-10-10 ENCOUNTER — Encounter: Payer: Self-pay | Admitting: Cardiovascular Disease

## 2021-10-13 ENCOUNTER — Encounter: Payer: Self-pay | Admitting: Family Medicine

## 2021-10-14 ENCOUNTER — Encounter: Payer: Self-pay | Admitting: Family Medicine

## 2021-10-14 ENCOUNTER — Telehealth: Payer: Self-pay | Admitting: Family Medicine

## 2021-10-14 MED ORDER — QUETIAPINE FUMARATE 25 MG PO TABS: 25.0000 mg | ORAL_TABLET | Freq: Every day | ORAL | 0 refills | Status: DC

## 2021-10-14 NOTE — Progress Notes (Signed)
ERx  ?Pt notified.  ?

## 2021-10-14 NOTE — Telephone Encounter (Signed)
Error

## 2021-10-14 NOTE — Telephone Encounter (Signed)
FYI Cardiologist took this medication off patient's chart on 10/09/21 I guess by mistake so it is not on active medication list.  ?

## 2021-10-14 NOTE — Telephone Encounter (Signed)
Pts daughter called back and said pt has sundowners and it helps her sleep and she is out of the medication. She wanted to know if there is anyway to get something sent in tonight to [facility]. They usually get through [facility] and they said its supposed to come in tomorrow, so if they could get a temporary for a night or 2 in case it doesn't come in tomorrow. Call back is [phone] ?

## 2021-10-21 ENCOUNTER — Telehealth: Payer: Self-pay

## 2021-10-21 NOTE — Telephone Encounter (Signed)
Received fax not started on cover my med.  ? ? ?

## 2021-10-22 NOTE — Telephone Encounter (Signed)
Made pharmacy aware of PA approval. Says they will get it filled now.  Notified pt's daughter, [name] (on dpr) aware via [place].  ?

## 2021-10-22 NOTE — Telephone Encounter (Signed)
Patient's daughter called the insurance company. The reason prior auth was needed is because they were trying to get a 90 day refill ? ?Meredith Gutierrez is asking that we send the script for 30 day refills to the mail order pharmacy ? ?Patient has enough to get through Saturday, so would like an emergency supply called in to  ?[facility] [place] Phone:  [phone]  ?Fax:  [phone]  ?  ? So she doesn't run out ? ? ?

## 2021-10-22 NOTE — Telephone Encounter (Signed)
Cover my meds approval received. ? ?Meredith Baseman KeyDallas Gutierrez - PA Case ID: [id number] - Rx #: [id number] ? ?Outcome ?Approvedtoday ? ?PA Case: [id number], Status: Approved, Coverage Starts on: 06/14/2021 12:00:00 AM, Coverage Ends on: 06/13/2022 12:00:00 AM.  ?Questions? Contact [phone]. ? ?Drug ?QUEtiapine Fumarate '25MG'$  tablets ?Form ?[employer] Electronic PA Form ?

## 2021-10-26 DIAGNOSIS — E1165 Type 2 diabetes mellitus with hyperglycemia: Secondary | ICD-10-CM | POA: Diagnosis not present

## 2021-11-24 DIAGNOSIS — M47816 Spondylosis without myelopathy or radiculopathy, lumbar region: Secondary | ICD-10-CM | POA: Diagnosis not present

## 2021-11-24 DIAGNOSIS — F1120 Opioid dependence, uncomplicated: Secondary | ICD-10-CM | POA: Diagnosis not present

## 2021-11-24 DIAGNOSIS — F0390 Unspecified dementia without behavioral disturbance: Secondary | ICD-10-CM | POA: Diagnosis not present

## 2022-01-14 DIAGNOSIS — E1165 Type 2 diabetes mellitus with hyperglycemia: Secondary | ICD-10-CM | POA: Diagnosis not present

## 2022-02-08 DIAGNOSIS — F0390 Unspecified dementia without behavioral disturbance: Secondary | ICD-10-CM | POA: Diagnosis not present

## 2022-02-08 DIAGNOSIS — F1120 Opioid dependence, uncomplicated: Secondary | ICD-10-CM | POA: Diagnosis not present

## 2022-02-08 DIAGNOSIS — M47816 Spondylosis without myelopathy or radiculopathy, lumbar region: Secondary | ICD-10-CM | POA: Diagnosis not present

## 2022-02-15 DIAGNOSIS — R509 Fever, unspecified: Secondary | ICD-10-CM | POA: Diagnosis not present

## 2022-02-15 DIAGNOSIS — N390 Urinary tract infection, site not specified: Secondary | ICD-10-CM | POA: Diagnosis not present

## 2022-02-22 DIAGNOSIS — E139 Other specified diabetes mellitus without complications: Secondary | ICD-10-CM | POA: Diagnosis not present

## 2022-02-22 DIAGNOSIS — I10 Essential (primary) hypertension: Secondary | ICD-10-CM | POA: Diagnosis not present

## 2022-02-22 DIAGNOSIS — M8580 Other specified disorders of bone density and structure, unspecified site: Secondary | ICD-10-CM | POA: Diagnosis not present

## 2022-02-22 DIAGNOSIS — E0590 Thyrotoxicosis, unspecified without thyrotoxic crisis or storm: Secondary | ICD-10-CM | POA: Diagnosis not present

## 2022-04-02 DIAGNOSIS — N3090 Cystitis, unspecified without hematuria: Secondary | ICD-10-CM | POA: Diagnosis not present

## 2022-04-02 DIAGNOSIS — R300 Dysuria: Secondary | ICD-10-CM | POA: Diagnosis not present

## 2022-04-02 DIAGNOSIS — I10 Essential (primary) hypertension: Secondary | ICD-10-CM | POA: Diagnosis not present

## 2022-04-02 DIAGNOSIS — R410 Disorientation, unspecified: Secondary | ICD-10-CM | POA: Diagnosis not present

## 2022-04-05 DIAGNOSIS — E1165 Type 2 diabetes mellitus with hyperglycemia: Secondary | ICD-10-CM | POA: Diagnosis not present

## 2022-04-30 DIAGNOSIS — F1120 Opioid dependence, uncomplicated: Secondary | ICD-10-CM | POA: Diagnosis not present

## 2022-04-30 DIAGNOSIS — M47816 Spondylosis without myelopathy or radiculopathy, lumbar region: Secondary | ICD-10-CM | POA: Diagnosis not present

## 2022-04-30 DIAGNOSIS — F0390 Unspecified dementia without behavioral disturbance: Secondary | ICD-10-CM | POA: Diagnosis not present

## 2022-05-02 ENCOUNTER — Other Ambulatory Visit: Payer: Self-pay | Admitting: Family Medicine

## 2022-05-02 DIAGNOSIS — E1169 Type 2 diabetes mellitus with other specified complication: Secondary | ICD-10-CM

## 2022-05-02 DIAGNOSIS — E0590 Thyrotoxicosis, unspecified without thyrotoxic crisis or storm: Secondary | ICD-10-CM

## 2022-05-02 DIAGNOSIS — E538 Deficiency of other specified B group vitamins: Secondary | ICD-10-CM

## 2022-05-02 DIAGNOSIS — E118 Type 2 diabetes mellitus with unspecified complications: Secondary | ICD-10-CM

## 2022-05-02 DIAGNOSIS — E559 Vitamin D deficiency, unspecified: Secondary | ICD-10-CM

## 2022-05-03 ENCOUNTER — Ambulatory Visit: Payer: Medicare HMO

## 2022-05-03 DIAGNOSIS — F0390 Unspecified dementia without behavioral disturbance: Secondary | ICD-10-CM | POA: Insufficient documentation

## 2022-05-04 ENCOUNTER — Other Ambulatory Visit: Payer: Medicare HMO

## 2022-05-11 ENCOUNTER — Encounter: Payer: Medicare HMO | Admitting: Family Medicine

## 2022-07-04 DIAGNOSIS — E1165 Type 2 diabetes mellitus with hyperglycemia: Secondary | ICD-10-CM | POA: Diagnosis not present

## 2022-07-06 ENCOUNTER — Ambulatory Visit (INDEPENDENT_AMBULATORY_CARE_PROVIDER_SITE_OTHER): Payer: Medicare HMO

## 2022-07-06 VITALS — Ht 60.0 in | Wt 136.0 lb

## 2022-07-06 DIAGNOSIS — Z0000 Encounter for general adult medical examination without abnormal findings: Secondary | ICD-10-CM

## 2022-07-06 NOTE — Progress Notes (Signed)
Virtual Visit via Telephone Note  I connected with  Meredith Gutierrez on 07/06/22 at  2:45 PM EST by telephone and verified that I am speaking with the correct person using two identifiers. Also Spoke w/ daughter Meredith Gutierrez.  Location: Patient: home Provider: [place] Persons participating in the virtual visit: [place]   I discussed the limitations, risks, security and privacy concerns of performing an evaluation and management service by telephone and the availability of in person appointments. The patient expressed understanding and agreed to proceed.  Interactive audio and video telecommunications were attempted between this nurse and patient, however failed, due to patient having technical difficulties OR patient did not have access to video capability.  We continued and completed visit with audio only.  Some vital signs may be absent or patient reported.   Meredith David, LPN  Subjective:   Meredith Gutierrez is a 82 y.o. female who presents for Medicare Annual (Subsequent) preventive examination.  Review of Systems     Cardiac Risk Factors include: advanced age (>56mn, >>75women);diabetes mellitus;hypertension     Objective:    There were no vitals filed for this visit. There is no height or weight on file to calculate BMI.     07/06/2022    2:50 PM 12/01/2019    8:00 AM 09/04/2019    6:57 AM 06/27/2019    2:59 PM 06/26/2019   11:45 PM 04/23/2019   11:52 AM 02/22/2019    6:44 AM  Advanced Directives  Does Patient Have a Medical Advance Directive? Yes Yes Yes Yes Unable to assess, patient is non-responsive or altered mental status Yes Yes  Type of Advance Directive [place] will  [place] will Healthcare Power of AMount PleasantLiving will [place]ammamishLiving will  Does patient want to make changes to medical advance directive? No - Patient declined  No  - Guardian declined No - Guardian declined     Copy of [place] Chart? Yes - validated most recent copy scanned in chart (See row information)  Yes - validated most recent copy scanned in chart (See row information) No - copy requested  Yes - validated most recent copy scanned in chart (See row information)     Current Medications (verified) Outpatient Encounter Medications as of 07/06/2022  Medication Sig   albuterol (PROVENTIL) (2.5 MG/3ML) 0.083% nebulizer solution Take 3 mLs (2.5 mg total) by nebulization every 6 (six) hours as needed for wheezing or shortness of breath.   Alcohol Swabs (B-D SINGLE USE SWABS REGULAR) PADS 1 each by Does not apply route daily. Use as directed to check blood sugar once daily.  Dx code:  E11.65   ascorbic acid (VITAMIN C) 500 MG tablet Take 500 mg by mouth daily.   aspirin EC 81 MG tablet Take 81 mg by mouth daily.   BD PEN NEEDLE NANO 2ND GEN 32G X 4 MM MISC as directed subcutaneous Three times a day for 90 days   Blood Glucose Monitoring Suppl (ACCU-CHEK AVIVA PLUS) w/Device KIT 1 each by Does not apply route daily. Use as directed to check blood sugar once daily.  Dx code:  E11.65   candesartan (ATACAND) 4 MG tablet Take 1 tablet (4 mg total) by mouth daily.   cholecalciferol (VITAMIN D3) 25 MCG (1000 UNIT) tablet Take 1,000 Units by mouth daily.   Continuous Blood Gluc Receiver ([place]ert[place] DEVI by Does not  apply route.   Continuous Blood Gluc Sensor (DEXCOM G6 SENSOR) MISC by Does not apply route.   Continuous Blood Gluc Transmit (DEXCOM G6 TRANSMITTER) MISC by Does not apply route.   Cyanocobalamin (B-12) 1000 MCG SUBL Place 1 tablet under the tongue once a week.   glucose blood (ACCU-CHEK AVIVA PLUS) test strip 1 each by Other route as needed for other. Use as instructed to check blood sugar once daily.  Dx code:  E11.65   Insulin Pen Needle (PEN NEEDLES) 33G X 4 MM MISC Use as instructed to administer insulin daily.    meloxicam (MOBIC) 7.5 MG tablet Take by mouth. As needed   morphine (MS CONTIN) 15 MG 12 hr tablet Take 15 mg by mouth daily.   morphine (MS CONTIN) 30 MG 12 hr tablet 1 tablet Orally every 6 hrs   NOVOLOG MIX 70/30 FLEXPEN (70-30) 100 UNIT/ML FlexPen 22 units in the morning, 32 units in the evening   QUEtiapine (SEROQUEL) 25 MG tablet TAKE 1 TO 2 TABLETS AT BEDTIME   ALPRAZolam (XANAX) 0.25 MG tablet 1 tablet Orally Twice a day, prn (Patient not taking: Reported on 07/06/2022)   loratadine (CLARITIN) 10 MG tablet Take 1 tablet (10 mg total) by mouth daily. (Patient not taking: Reported on 07/06/2022)   [DISCONTINUED] QUEtiapine (SEROQUEL) 25 MG tablet Take 1-2 tablets (25-50 mg total) by mouth at bedtime. (Patient not taking: Reported on 07/06/2022)   No facility-administered encounter medications on file as of 07/06/2022.    Allergies (verified) Codeine, Prednisolone acetate, Prednisone, Nitroglycerin, Gabapentin, Metolazone, Other, Adhesive [tape], Canagliflozin, Crestor [rosuvastatin calcium], Lopressor [metoprolol tartrate], Mupirocin, Sulfamethoxazole, and Zetia [ezetimibe]   History: Past Medical History:  Diagnosis Date   Angina    never needed to take nitroglycerin.   Anxiety    Blood transfusion without reported diagnosis    Cerebral atherosclerosis    Community acquired pneumonia of left lower lobe of lung 12/20/2018   COPD (chronic obstructive pulmonary disease) ([place])    02-16-13-"pt. denies this"-shows on CXR of 2- 2013.   Coronary artery disease 2003   s/p CABG 2014   COVID-19 virus infection 06/2019   Covid-19 PNA   Dementia ([place])    Diabetes mellitus without complication ([place]) 40/0867   NEW ONSET   Diastolic CHF, on admit treated. 08/10/2011   Family history of adverse reaction to anesthesia    Dad and Sister had trouble waking up   Fibromuscular dysplasia of renal artery (HCC)    right   Heart murmur    History of kidney stones 02-16-13   "hx. of overgrowth of  muscle-causing some stricture"renal artery stenosis-being followed [facility]   Hyperlipidemia    Hypertension    Hyperthyroidism    [place]   Hyponatremia 04/2018   Melanoma ([facility]) 2010   skin cancers, 1 melanoma removed back   Osteoarthritis    Pneumonia due to COVID-19 virus 06/26/2019   Past Surgical History:  Procedure Laterality Date   ABDOMINAL HYSTERECTOMY  1970   heavy bleeding   APPENDECTOMY  1960s   CARDIAC CATHETERIZATION  08/09/2011   no sign. coronary obstruction. LIMA is atretic w/excellent flow down the native LAD   CARDIAC CATHETERIZATION  08/22/2001   no sign. coronary stenosis,   CATARACT EXTRACTION, BILATERAL Bilateral    CESAREAN SECTION     CORONARY ARTERY BYPASS GRAFT  02-16-13   3'03-no bypasses -Had 2 aneurysm repairs(stents used)   ESOPHAGOGASTRODUODENOSCOPY  04/2018   LA Grade A esophagitis, small nodule distal esophagus (benign  schwann cell hamartoma), empiric dilation to 89m ([place])   LAPAROTOMY N/A 02/20/2013   Procedure: EXPLORATORY LAPAROTOMY;  WJanie Morning MD   LEFT HEART CATHETERIZATION WITH CORONARY ANGIOGRAM N/A 08/09/2011   Procedure: LEFT HEART CATHETERIZATION WITH CORONARY ANGIOGRAM;  Surgeon: MSanda Klein MD;  [facility];  Service: Cardiovascular;  Laterality: N/A;   NM MYOCAR PERF WALL MOTION  07/20/2010   normal   RADIOLOGY WITH ANESTHESIA N/A 02/22/2019   Procedure: MRI OF ABDOMEN WITH AND WITHOUT CONTRAST;  Surgeon: Radiologist, Medication, MD;  [facility]  Service: Radiology;  Laterality: N/A;   RADIOLOGY WITH ANESTHESIA Bilateral 09/04/2019   Procedure: MRI WITH ANESTHESIA OF ABDOMEN WITH AND WITHOUT CONTRAST, BRAIN WITHOUT CONTRAST;  Surgeon: Radiologist, Medication, MD;  [facility]  Service: Radiology;  Laterality: Bilateral;   SALPINGOOPHORECTOMY Bilateral 02/20/2013   benign seromucinous cystadenofibroma R ovary ([name] MD)   TOOTH EXTRACTION     TOTAL HIP ARTHROPLASTY Left 2015   Family History  Problem  Relation Age of Onset   Stroke Mother    Hypotension Mother    Dementia Mother 642  Coronary artery disease Father    Hypertension Father    Breast cancer Maternal Aunt    Hypotension Sister    Hypertension Brother    Diabetes Brother    Lung cancer Daughter    Thyroid cancer Daughter    Colon polyps Maternal Grandfather    Colon cancer Neg Hx    Esophageal cancer Neg Hx    Stomach cancer Neg Hx    Rectal cancer Neg Hx    Social History   Socioeconomic History   Marital status: Widowed    Spouse name: Not on file   Number of children: 2   Years of education: 12   Highest education level: High school graduate  Occupational History   Occupation: Retired  Tobacco Use   Smoking status: Former    Packs/day: 0.10    Years: 20.00    Total pack years: 2.00    Types: Cigarettes    Quit date: 09/14/2017    Years since quitting: 4.8   Smokeless tobacco: Never   Tobacco comments:    smoking cessation info given  Vaping Use   Vaping Use: Never used  Substance and Sexual Activity   Alcohol use: Yes    Comment: occasional   Drug use: No   Sexual activity: Not on file  Other Topics Concern   Not on file  Social History Narrative   Widow   Lives with daughter CArbie Cookeyand daughter's GF   Edu: HS   Occ: retired, worked at [employer]  Activity: no regular exercise   Diet: good water    Right-handed.   Occasional caffeine use.   Social Determinants of Health   Financial Resource Strain: Low Risk  (07/06/2022)   Overall Financial Resource Strain (CARDIA)    Difficulty of Paying Living Expenses: Not hard at all  Food Insecurity: No Food Insecurity (07/06/2022)   Hunger Vital Sign    Worried About Running Out of Food in the Last Year: Never true    Ran Out of Food in the Last Year: Never true  Transportation Needs: No Transportation Needs (07/06/2022)   PRAPARE - THydrologist(Medical): No    Lack of Transportation (Non-Medical): No  Physical Activity:  Inactive (07/06/2022)   Exercise Vital Sign    Days of Exercise per Week: 0 days    Minutes of  Exercise per Session: 0 min  Stress: No Stress Concern Present (07/06/2022)   [place]    Feeling of Stress : Not at all  Social Connections: Socially Isolated (07/06/2022)   Social Connection and Isolation Panel [NHANES]    Frequency of Communication with Friends and Family: Never    Frequency of Social Gatherings with Friends and Family: More than three times a week    Attends Religious Services: Never    Marine scientist or Organizations: No    Attends Archivist Meetings: Never    Marital Status: Widowed    Tobacco Counseling Counseling given: Not Answered Tobacco comments: smoking cessation info given   Clinical Intake:  Pre-visit preparation completed: Yes  Pain : No/denies pain     Nutritional Risks: None Diabetes: Yes CBG done?: No Did pt. bring in CBG monitor from home?: No  How often do you need to have someone help you when you read instructions, pamphlets, or other written materials from your doctor or pharmacy?: 1 - Never  Diabetic?yes Nutrition Risk Assessment:  Has the patient had any N/V/D within the last 2 months?  No  Does the patient have any non-healing wounds?  No  Has the patient had any unintentional weight loss or weight gain?  No   Diabetes:  Is the patient diabetic?  Yes  If diabetic, was a CBG obtained today?  No  Did the patient bring in their glucometer from home?  No  How often do you monitor your CBG's? continuous.   Financial Strains and Diabetes Management:  Are you having any financial strains with the device, your supplies or your medication? No .  Does the patient want to be seen by Chronic Care Management for management of their diabetes?  No  Would the patient like to be referred to a Nutritionist or for Diabetic Management?  No   Diabetic  Exams:  Diabetic Eye Exam: Completed 04/28/20. Overdue for diabetic eye exam. Pt has been advised about the importance in completing this exam.  Diabetic Foot Exam: Completed 05/02/20. Pt has been advised about the importance in completing this exam.     Interpreter Needed?: No  Information entered by :: Kirke Shaggy, LPN   Activities of Daily Living    07/06/2022    2:51 PM  In your present state of health, do you have any difficulty performing the following activities:  Hearing? 0  Vision? 0  Difficulty concentrating or making decisions? 1  Walking or climbing stairs? 1  Dressing or bathing? 1  Doing errands, shopping? 1  Preparing Food and eating ? Y  Using the Toilet? N  In the past six months, have you accidently leaked urine? N  Do you have problems with loss of bowel control? N  Managing your Medications? Y  Managing your Finances? Y  Housekeeping or managing your Housekeeping? Y    Patient Care Team: Ria Bush, MD as PCP - General (Family Medicine) Croitoru, Dani Gobble, MD as PCP - Cardiology (Cardiology) Debbora Dus, [place] as Pharmacist (Pharmacist)  Indicate any recent Medical Services you may have received from other than [facility] providers in the past year (date may be approximate).     Assessment:   This is a routine wellness examination for [place].  Hearing/Vision screen Hearing Screening - Comments:: No aids Vision Screening - Comments:: No glasses- [facility]  Dietary issues and exercise activities discussed: Current Exercise Habits: The patient does not participate in  regular exercise at present   Goals Addressed             This Visit's Progress    DIET - EAT MORE FRUITS AND VEGETABLES         Depression Screen    07/06/2022    2:48 PM 08/13/2019    4:24 PM 04/23/2019   11:53 AM 04/07/2018    8:39 AM 04/06/2017   10:40 AM  PHQ 2/9 Scores  PHQ - 2 Score 0 0 0 0 0  PHQ- 9 Score 0  0 0 0    Fall Risk    07/06/2022    2:50 PM  05/04/2021    8:31 AM 08/13/2019    4:20 PM 04/23/2019   11:53 AM 04/07/2018    8:39 AM  [place] in the past year? 0 0 1 0 No  Number falls in past yr: 0  1 0   Injury with Fall? 0  0 0   Risk for fall due to : No Fall Risks   Medication side effect   Follow up Falls prevention discussed;Falls evaluation completed   Falls evaluation completed;Falls prevention discussed     FALL RISK PREVENTION PERTAINING TO THE HOME:  Any stairs in or around the home? Yes  If so, are there any without handrails? No  Home free of loose throw rugs in walkways, pet beds, electrical cords, etc? Yes  Adequate lighting in your home to reduce risk of falls? Yes   ASSISTIVE DEVICES UTILIZED TO PREVENT FALLS:  Life alert? No  Use of a cane, walker or w/c? No  Grab bars in the bathroom? Yes  Shower chair or bench in shower? Yes  Elevated toilet seat or a handicapped toilet? Yes     Cognitive Function:pt unavailable      08/28/2019    2:39 PM 04/23/2019   11:55 AM 04/06/2017   10:40 AM  MMSE - Mini Mental State Exam  Not completed:  Unable to complete   Orientation to time 1  5  Orientation to Place 1  5  Registration 3  3  Attention/ Calculation 0  0  Recall 0  0  Recall-comments   unable to recall 3 of 3 words  Language- name 2 objects 1  0  Language- repeat 0  1  Language- follow 3 step command 2  1  Language- follow 3 step command-comments   unable to follow 2 steps of 3 step command  Language- read & follow direction 1  0  Write a sentence 1  0  Copy design 0  0  Total score 10  15        Immunizations Immunization History  Administered Date(s) Administered   Fluad Quad(high Dose 65+) 03/05/2019, 03/18/2020, 05/04/2021   Influenza,inj,Quad PF,6+ Mos 04/06/2017, 04/03/2018   Moderna Sars-Covid-2 Vaccination 01/09/2020, 02/05/2020   Pneumococcal Conjugate-13 12/02/2014   Pneumococcal Polysaccharide-23 06/27/2008, 12/22/2018   Zoster, Live 04/16/2014    TDAP status:  Due, Education has been provided regarding the importance of this vaccine. Advised may receive this vaccine at local pharmacy or Health Dept. Aware to provide a copy of the vaccination record if obtained from local pharmacy or Health Dept. Verbalized acceptance and understanding.  Flu Vaccine status: Up to date  Pneumococcal vaccine status: Up to date  Covid-19 vaccine status: Completed vaccines  Qualifies for Shingles Vaccine? Yes   Zostavax completed Yes   Shingrix Completed?: No.    Education has been provided regarding  the importance of this vaccine. Patient has been advised to call insurance company to determine out of pocket expense if they have not yet received this vaccine. Advised may also receive vaccine at local pharmacy or Health Dept. Verbalized acceptance and understanding.  Screening Tests Health Maintenance  Topic Date Due   Zoster Vaccines- Shingrix (1 of 2) Never done   COVID-19 Vaccine (3 - Moderna risk series) 03/04/2020   Diabetic kidney evaluation - Urine ACR  08/09/2020   OPHTHALMOLOGY EXAM  12/23/2020   FOOT EXAM  05/02/2021   HEMOGLOBIN A1C  11/01/2021   INFLUENZA VACCINE  01/12/2022   Diabetic kidney evaluation - eGFR measurement  05/04/2022   DTaP/Tdap/Td (1 - Tdap) 04/07/2027 (Originally 01/03/1960)   Medicare Annual Wellness (AWV)  07/07/2023   Pneumonia Vaccine 43+ Years old  Completed   DEXA SCAN  Completed   HPV VACCINES  Aged Out    Health Maintenance  Health Maintenance Due  Topic Date Due   Zoster Vaccines- Shingrix (1 of 2) Never done   COVID-19 Vaccine (3 - Moderna risk series) 03/04/2020   Diabetic kidney evaluation - Urine ACR  08/09/2020   OPHTHALMOLOGY EXAM  12/23/2020   FOOT EXAM  05/02/2021   HEMOGLOBIN A1C  11/01/2021   INFLUENZA VACCINE  01/12/2022   Diabetic kidney evaluation - eGFR measurement  05/04/2022    Colorectal cancer screening: No longer required.   Mammogram status: No longer required due to age.  Bone Density  status: Completed 08/13/15. Results reflect: Bone density results: NORMAL. Repeat every 5 years.- declined referral  Lung Cancer Screening: (Low Dose CT Chest recommended if Age 48-80 years, 30 pack-year currently smoking OR have quit w/in 15years.) does not qualify.   Additional Screening:  Hepatitis C Screening: does not qualify; Completed no  Vision Screening: Recommended annual ophthalmology exams for early detection of glaucoma and other disorders of the eye. Is the patient up to date with their annual eye exam?  Yes  Who is the provider or what is the name of the office in which the patient attends annual eye exams? [facility]. If pt is not established with a provider, would they like to be referred to a provider to establish care? No .   Dental Screening: Recommended annual dental exams for proper oral hygiene  Community Resource Referral / Chronic Care Management: CRR required this visit?  No   CCM required this visit?  No      Plan:     I have personally reviewed and noted the following in the patient's chart:   Medical and social history Use of alcohol, tobacco or illicit drugs  Current medications and supplements including opioid prescriptions. Patient is not currently taking opioid prescriptions. Functional ability and status Nutritional status Physical activity Advanced directives List of other physicians Hospitalizations, surgeries, and ER visits in previous 12 months Vitals Screenings to include cognitive, depression, and falls Referrals and appointments  In addition, I have reviewed and discussed with patient certain preventive protocols, quality metrics, and best practice recommendations. A written personalized care plan for preventive services as well as general preventive health recommendations were provided to patient.     Meredith David, LPN   [id number]/67/[id number]   Nurse Notes: none

## 2022-07-06 NOTE — Patient Instructions (Signed)
Meredith Gutierrez , Thank you for taking time to come for your Medicare Wellness Visit. I appreciate your ongoing commitment to your health goals. Please review the following plan we discussed and let me know if I can assist you in the future.   These are the goals we discussed:  Goals      DIET - EAT MORE FRUITS AND VEGETABLES     Increase physical activity     Starting 04/07/2018, I will continue to walk at least 15 min daily.      Patient Stated     04/23/2019, I will maintain and continue medications as prescribed.         This is a list of the screening recommended for you and due dates:  Health Maintenance  Topic Date Due   Zoster (Shingles) Vaccine (1 of 2) Never done   COVID-19 Vaccine (3 - Moderna risk series) 03/04/2020   Yearly kidney health urinalysis for diabetes  08/09/2020   Eye exam for diabetics  12/23/2020   Complete foot exam   05/02/2021   Hemoglobin A1C  11/01/2021   Flu Shot  01/12/2022   Yearly kidney function blood test for diabetes  05/04/2022   DTaP/Tdap/Td vaccine (1 - Tdap) 04/07/2027*   Medicare Annual Wellness Visit  07/07/2023   Pneumonia Vaccine  Completed   DEXA scan (bone density measurement)  Completed   HPV Vaccine  Aged Out  *Topic was postponed. The date shown is not the original due date.    Advanced directives: on  Conditions/risks identified: none  Next appointment: Follow up in one year for your annual wellness visit 07/08/23 @ 10:45 am by phone   Preventive Care 65 Years and Older, Female Preventive care refers to lifestyle choices and visits with your health care provider that can promote health and wellness. What does preventive care include? A yearly physical exam. This is also called an annual well check. Dental exams once or twice a year. Routine eye exams. Ask your health care provider how often you should have your eyes checked. Personal lifestyle choices, including: Daily care of your teeth and gums. Regular physical  activity. Eating a healthy diet. Avoiding tobacco and drug use. Limiting alcohol use. Practicing safe sex. Taking low-dose aspirin every day. Taking vitamin and mineral supplements as recommended by your health care provider. What happens during an annual well check? The services and screenings done by your health care provider during your annual well check will depend on your age, overall health, lifestyle risk factors, and family history of disease. Counseling  Your health care provider may ask you questions about your: Alcohol use. Tobacco use. Drug use. Emotional well-being. Home and relationship well-being. Sexual activity. Eating habits. History of falls. Memory and ability to understand (cognition). Work and work Statistician. Reproductive health. Screening  You may have the following tests or measurements: Height, weight, and BMI. Blood pressure. Lipid and cholesterol levels. These may be checked every 5 years, or more frequently if you are over 52 years old. Skin check. Lung cancer screening. You may have this screening every year starting at age 20 if you have a 30-pack-year history of smoking and currently smoke or have quit within the past 15 years. Fecal occult blood test (FOBT) of the stool. You may have this test every year starting at age 38. Flexible sigmoidoscopy or colonoscopy. You may have a sigmoidoscopy every 5 years or a colonoscopy every 10 years starting at age 66. Hepatitis C blood test. Hepatitis B blood  test. Sexually transmitted disease (STD) testing. Diabetes screening. This is done by checking your blood sugar (glucose) after you have not eaten for a while (fasting). You may have this done every 1-3 years. Bone density scan. This is done to screen for osteoporosis. You may have this done starting at age 40. Mammogram. This may be done every 1-2 years. Talk to your health care provider about how often you should have regular mammograms. Talk with your  health care provider about your test results, treatment options, and if necessary, the need for more tests. Vaccines  Your health care provider may recommend certain vaccines, such as: Influenza vaccine. This is recommended every year. Tetanus, diphtheria, and acellular pertussis (Tdap, Td) vaccine. You may need a Td booster every 10 years. Zoster vaccine. You may need this after age 49. Pneumococcal 13-valent conjugate (PCV13) vaccine. One dose is recommended after age 15. Pneumococcal polysaccharide (PPSV23) vaccine. One dose is recommended after age 58. Talk to your health care provider about which screenings and vaccines you need and how often you need them. This information is not intended to replace advice given to you by your health care provider. Make sure you discuss any questions you have with your health care provider. Document Released: 06/27/2015 Document Revised: 02/18/2016 Document Reviewed: 04/01/2015 Elsevier Interactive Patient Education  2017 [place] Prevention in the Home Falls can cause injuries. They can happen to people of all ages. There are many things you can do to make your home safe and to help prevent falls. What can I do on the outside of my home? Regularly fix the edges of walkways and driveways and fix any cracks. Remove anything that might make you trip as you walk through a door, such as a raised step or threshold. Trim any bushes or trees on the path to your home. Use bright outdoor lighting. Clear any walking paths of anything that might make someone trip, such as rocks or tools. Regularly check to see if handrails are loose or broken. Make sure that both sides of any steps have handrails. Any raised decks and porches should have guardrails on the edges. Have any leaves, snow, or ice cleared regularly. Use sand or salt on walking paths during winter. Clean up any spills in your garage right away. This includes oil or grease spills. What can I  do in the bathroom? Use night lights. Install grab bars by the toilet and in the tub and shower. Do not use towel bars as grab bars. Use non-skid mats or decals in the tub or shower. If you need to sit down in the shower, use a plastic, non-slip stool. Keep the floor dry. Clean up any water that spills on the floor as soon as it happens. Remove soap buildup in the tub or shower regularly. Attach bath mats securely with double-sided non-slip rug tape. Do not have throw rugs and other things on the floor that can make you trip. What can I do in the bedroom? Use night lights. Make sure that you have a light by your bed that is easy to reach. Do not use any sheets or blankets that are too big for your bed. They should not hang down onto the floor. Have a firm chair that has side arms. You can use this for support while you get dressed. Do not have throw rugs and other things on the floor that can make you trip. What can I do in the kitchen? Clean up any spills right  away. Avoid walking on wet floors. Keep items that you use a lot in easy-to-reach places. If you need to reach something above you, use a strong step stool that has a grab bar. Keep electrical cords out of the way. Do not use floor polish or wax that makes floors slippery. If you must use wax, use non-skid floor wax. Do not have throw rugs and other things on the floor that can make you trip. What can I do with my stairs? Do not leave any items on the stairs. Make sure that there are handrails on both sides of the stairs and use them. Fix handrails that are broken or loose. Make sure that handrails are as long as the stairways. Check any carpeting to make sure that it is firmly attached to the stairs. Fix any carpet that is loose or worn. Avoid having throw rugs at the top or bottom of the stairs. If you do have throw rugs, attach them to the floor with carpet tape. Make sure that you have a light switch at the top of the stairs  and the bottom of the stairs. If you do not have them, ask someone to add them for you. What else can I do to help prevent falls? Wear shoes that: Do not have high heels. Have rubber bottoms. Are comfortable and fit you well. Are closed at the toe. Do not wear sandals. If you use a stepladder: Make sure that it is fully opened. Do not climb a closed stepladder. Make sure that both sides of the stepladder are locked into place. Ask someone to hold it for you, if possible. Clearly mark and make sure that you can see: Any grab bars or handrails. First and last steps. Where the edge of each step is. Use tools that help you move around (mobility aids) if they are needed. These include: Canes. Walkers. Scooters. Crutches. Turn on the lights when you go into a dark area. Replace any light bulbs as soon as they burn out. Set up your furniture so you have a clear path. Avoid moving your furniture around. If any of your floors are uneven, fix them. If there are any pets around you, be aware of where they are. Review your medicines with your doctor. Some medicines can make you feel dizzy. This can increase your chance of falling. Ask your doctor what other things that you can do to help prevent falls. This information is not intended to replace advice given to you by your health care provider. Make sure you discuss any questions you have with your health care provider. Document Released: 03/27/2009 Document Revised: 11/06/2015 Document Reviewed: 07/05/2014 Elsevier Interactive Patient Education  2017 [employer].

## 2022-07-09 DIAGNOSIS — M47816 Spondylosis without myelopathy or radiculopathy, lumbar region: Secondary | ICD-10-CM | POA: Diagnosis not present

## 2022-07-09 DIAGNOSIS — F0390 Unspecified dementia without behavioral disturbance: Secondary | ICD-10-CM | POA: Diagnosis not present

## 2022-07-09 DIAGNOSIS — N3001 Acute cystitis with hematuria: Secondary | ICD-10-CM | POA: Diagnosis not present

## 2022-07-09 DIAGNOSIS — F1120 Opioid dependence, uncomplicated: Secondary | ICD-10-CM | POA: Diagnosis not present

## 2022-07-09 DIAGNOSIS — R509 Fever, unspecified: Secondary | ICD-10-CM | POA: Diagnosis not present

## 2022-07-09 DIAGNOSIS — M5416 Radiculopathy, lumbar region: Secondary | ICD-10-CM | POA: Diagnosis not present

## 2022-07-16 ENCOUNTER — Other Ambulatory Visit (INDEPENDENT_AMBULATORY_CARE_PROVIDER_SITE_OTHER): Payer: Medicare HMO

## 2022-07-16 DIAGNOSIS — E785 Hyperlipidemia, unspecified: Secondary | ICD-10-CM

## 2022-07-16 DIAGNOSIS — E1169 Type 2 diabetes mellitus with other specified complication: Secondary | ICD-10-CM | POA: Diagnosis not present

## 2022-07-16 DIAGNOSIS — E0590 Thyrotoxicosis, unspecified without thyrotoxic crisis or storm: Secondary | ICD-10-CM | POA: Diagnosis not present

## 2022-07-16 DIAGNOSIS — E538 Deficiency of other specified B group vitamins: Secondary | ICD-10-CM | POA: Diagnosis not present

## 2022-07-16 DIAGNOSIS — E118 Type 2 diabetes mellitus with unspecified complications: Secondary | ICD-10-CM

## 2022-07-16 DIAGNOSIS — E559 Vitamin D deficiency, unspecified: Secondary | ICD-10-CM | POA: Diagnosis not present

## 2022-07-16 DIAGNOSIS — Z794 Long term (current) use of insulin: Secondary | ICD-10-CM

## 2022-07-16 LAB — CBC WITH DIFFERENTIAL/PLATELET
Basophils Absolute: 0.1 10*3/uL (ref 0.0–0.1)
Basophils Relative: 0.8 % (ref 0.0–3.0)
Eosinophils Absolute: 0.2 10*3/uL (ref 0.0–0.7)
Eosinophils Relative: 2.8 % (ref 0.0–5.0)
HCT: 38.8 % (ref 36.0–46.0)
Hemoglobin: 13 g/dL (ref 12.0–15.0)
Lymphocytes Relative: 22.8 % (ref 12.0–46.0)
Lymphs Abs: 1.8 10*3/uL (ref 0.7–4.0)
MCHC: 33.5 g/dL (ref 30.0–36.0)
MCV: 89 fl (ref 78.0–100.0)
Monocytes Absolute: 0.7 10*3/uL (ref 0.1–1.0)
Monocytes Relative: 8.7 % (ref 3.0–12.0)
Neutro Abs: 5 10*3/uL (ref 1.4–7.7)
Neutrophils Relative %: 64.9 % (ref 43.0–77.0)
Platelets: 334 10*3/uL (ref 150.0–400.0)
RBC: 4.35 Mil/uL (ref 3.87–5.11)
RDW: 14.4 % (ref 11.5–15.5)
WBC: 7.7 10*3/uL (ref 4.0–10.5)

## 2022-07-16 LAB — COMPREHENSIVE METABOLIC PANEL
ALT: 12 U/L (ref 0–35)
AST: 16 U/L (ref 0–37)
Albumin: 3.9 g/dL (ref 3.5–5.2)
Alkaline Phosphatase: 69 U/L (ref 39–117)
BUN: 16 mg/dL (ref 6–23)
CO2: 27 mEq/L (ref 19–32)
Calcium: 9.6 mg/dL (ref 8.4–10.5)
Chloride: 107 mEq/L (ref 96–112)
Creatinine, Ser: 0.73 mg/dL (ref 0.40–1.20)
GFR: 75.99 mL/min (ref >60.00)
Glucose, Bld: 91 mg/dL (ref 70–99)
Potassium: 5 mEq/L (ref 3.5–5.1)
Sodium: 143 mEq/L (ref 135–145)
Total Bilirubin: 0.4 mg/dL (ref 0.2–1.2)
Total Protein: 6.6 g/dL (ref 6.0–8.3)

## 2022-07-16 LAB — LIPID PANEL
Cholesterol: 171 mg/dL (ref 0–200)
HDL: 37.6 mg/dL — ABNORMAL LOW (ref >39.00)
LDL Cholesterol: 114 mg/dL — ABNORMAL HIGH (ref 0–99)
NonHDL: 132.99
Total CHOL/HDL Ratio: 5
Triglycerides: 96 mg/dL (ref 0.0–149.0)
VLDL: 19.2 mg/dL (ref 0.0–40.0)

## 2022-07-16 LAB — MICROALBUMIN / CREATININE URINE RATIO
Creatinine,U: 106.7 mg/dL
Microalb Creat Ratio: 1.1 mg/g (ref 0.0–30.0)
Microalb, Ur: 1.1 mg/dL (ref 0.0–1.9)

## 2022-07-16 LAB — TSH: TSH: 1.4 u[IU]/mL (ref 0.35–5.50)

## 2022-07-16 LAB — VITAMIN D 25 HYDROXY (VIT D DEFICIENCY, FRACTURES): VITD: 31.74 ng/mL (ref 30.00–100.00)

## 2022-07-16 LAB — HEMOGLOBIN A1C: Hgb A1c MFr Bld: 5.4 % (ref 4.6–6.5)

## 2022-07-16 LAB — VITAMIN B12: Vitamin B-12: 846 pg/mL (ref 211–911)

## 2022-07-23 ENCOUNTER — Encounter: Payer: Self-pay | Admitting: Family Medicine

## 2022-07-23 ENCOUNTER — Ambulatory Visit (INDEPENDENT_AMBULATORY_CARE_PROVIDER_SITE_OTHER): Payer: Medicare HMO | Admitting: Family Medicine

## 2022-07-23 VITALS — BP 138/86 | HR 56 | Temp 97.6°F | Ht 59.25 in | Wt 129.4 lb

## 2022-07-23 DIAGNOSIS — Z87891 Personal history of nicotine dependence: Secondary | ICD-10-CM

## 2022-07-23 DIAGNOSIS — I773 Arterial fibromuscular dysplasia: Secondary | ICD-10-CM | POA: Diagnosis not present

## 2022-07-23 DIAGNOSIS — J449 Chronic obstructive pulmonary disease, unspecified: Secondary | ICD-10-CM

## 2022-07-23 DIAGNOSIS — Z0000 Encounter for general adult medical examination without abnormal findings: Secondary | ICD-10-CM | POA: Diagnosis not present

## 2022-07-23 DIAGNOSIS — Z86718 Personal history of other venous thrombosis and embolism: Secondary | ICD-10-CM

## 2022-07-23 DIAGNOSIS — E559 Vitamin D deficiency, unspecified: Secondary | ICD-10-CM

## 2022-07-23 DIAGNOSIS — K861 Other chronic pancreatitis: Secondary | ICD-10-CM

## 2022-07-23 DIAGNOSIS — Z794 Long term (current) use of insulin: Secondary | ICD-10-CM

## 2022-07-23 DIAGNOSIS — Z7189 Other specified counseling: Secondary | ICD-10-CM

## 2022-07-23 DIAGNOSIS — G8929 Other chronic pain: Secondary | ICD-10-CM

## 2022-07-23 DIAGNOSIS — F03918 Unspecified dementia, unspecified severity, with other behavioral disturbance: Secondary | ICD-10-CM | POA: Diagnosis not present

## 2022-07-23 DIAGNOSIS — E538 Deficiency of other specified B group vitamins: Secondary | ICD-10-CM

## 2022-07-23 DIAGNOSIS — Z951 Presence of aortocoronary bypass graft: Secondary | ICD-10-CM

## 2022-07-23 DIAGNOSIS — E1169 Type 2 diabetes mellitus with other specified complication: Secondary | ICD-10-CM | POA: Diagnosis not present

## 2022-07-23 DIAGNOSIS — R160 Hepatomegaly, not elsewhere classified: Secondary | ICD-10-CM

## 2022-07-23 DIAGNOSIS — I5032 Chronic diastolic (congestive) heart failure: Secondary | ICD-10-CM | POA: Diagnosis not present

## 2022-07-23 DIAGNOSIS — I700 Atherosclerosis of aorta: Secondary | ICD-10-CM | POA: Diagnosis not present

## 2022-07-23 DIAGNOSIS — M545 Low back pain, unspecified: Secondary | ICD-10-CM

## 2022-07-23 DIAGNOSIS — Z85820 Personal history of malignant melanoma of skin: Secondary | ICD-10-CM

## 2022-07-23 DIAGNOSIS — I10 Essential (primary) hypertension: Secondary | ICD-10-CM

## 2022-07-23 DIAGNOSIS — E0590 Thyrotoxicosis, unspecified without thyrotoxic crisis or storm: Secondary | ICD-10-CM

## 2022-07-23 DIAGNOSIS — E785 Hyperlipidemia, unspecified: Secondary | ICD-10-CM

## 2022-07-23 MED ORDER — B-12 1000 MCG SL SUBL: 1.0000 | SUBLINGUAL_TABLET | SUBLINGUAL | Status: DC

## 2022-07-23 MED ORDER — QUETIAPINE FUMARATE 25 MG PO TABS: 25.0000 mg | ORAL_TABLET | Freq: Every day | ORAL | 1 refills | Status: DC

## 2022-07-23 MED ORDER — POLYETHYLENE GLYCOL 3350 17 GM/SCOOP PO POWD: 17.0000 g | Freq: Every day | ORAL | Status: DC

## 2022-07-23 NOTE — Patient Instructions (Addendum)
Call dermatology for yearly skin cancer screening appointment in melanoma history.  You are doing well. Return as needed or in 1 year for next physical/wellness visit.

## 2022-07-23 NOTE — Progress Notes (Unsigned)
Patient ID: Meredith Gutierrez, female    DOB: 1940/10/14, 82 y.o.   MRN: [id number]  This visit was conducted in person.  BP 138/86   Pulse (!) 56   Temp 97.6 F (36.4 C) (Temporal)   Ht 4' 11.25" (1.505 m)   Wt 129 lb 6 oz (58.7 kg)   SpO2 96%   BMI 25.91 kg/m    CC: CPE Subjective:   HPI: Meredith Gutierrez is a 82 y.o. female presenting on 07/23/2022 for Annual Exam (MCR prt 2 [AWV- 07/06/22]. Pt accompanied by daughter, Meredith Gutierrez. )   Saw health advisor last month for medicare wellness visit. Note reviewed.    No results found.  Flowsheet Row Clinical Support from 07/06/2022 in [place] [facility] [place]  PHQ-2 Total Score 0          07/23/2022    2:25 PM 07/06/2022    2:50 PM 05/04/2021    8:31 AM 08/13/2019    4:20 PM 04/23/2019   11:53 AM  Fall Risk   Falls in the past year? 1 0 0 1 0  Number falls in past yr: 0 0  1 0  Injury with Fall? 0 0  0 0  Risk for fall due to :  No Fall Risks   Medication side effect  Follow up  Falls prevention discussed;Falls evaluation completed   Falls evaluation completed;Falls prevention discussed  Slipped out of bed overnight - fall with skin tear to arm, no other injury.   Chronic dCHF - followed by Meredith Meredith Gutierrez on PRN furosemide (rare use).  HLD - unable to tolerate statins or ezetimibe. Decided to remain off treatment.   DM - sees endo Meredith Gutierrez, managing as type 1 with novolog 70/30 22/32u daily.  Last seen 02/2022. Planning to switch to dexcom 7.   Preventative: Colon cancer screening - cologuard negative 04/2017. Aged out.  Breast cancer screening - mammo 03/2015 WNL. Has been to [name] previously. Given dementia, decided to age out.  Well woman exam - s/p hysterectomy, ovaries removed age 63, s/p HRT. Age out. No GYN symptoms.  DEXA scan - 2017, normal per daughter done by Meredith Gutierrez, will request records.  Lung cancer screening - not eligible. 1 pack/wk x about 30 yrs.  Flu shot yearly - missed this year   COVID vaccine - Moderna 77/2021, 01/2020, no boosters Pneumovax 2010, 2020, prevnar-13 2016 zostavax 2015 shingrix - discussed, declines  Advanced directive: living will/HCPOA scanned and in chart 03/2017. HCPOA are daughter Meredith Gutierrez and son Meredith Gutierrez. Does not want prolonged life support if terminal condition.  Seat belt use discussed  Sunscreen use discussed, no changing moles on skin. H/o melanoma. Rec yearly derm eval.  Ex-smoker - fully quit 2019, previously about 1 ppw.  Alcohol - none Bowel - no constipation - due to morphine - manages with miralax and stool stoftener prn Bladder - no incontinence    Widow Lives with daughter Meredith Gutierrez and daughter's partner Edu: HS Occ: retired, worked at [employer] Activity: no regular exercise Diet: good water      Relevant past medical, surgical, family and social history reviewed and updated as indicated. Interim medical history since our last visit reviewed. Allergies and medications reviewed and updated. Outpatient Medications Prior to Visit  Medication Sig Dispense Refill   albuterol (PROVENTIL) (2.5 MG/3ML) 0.083% nebulizer solution Take 3 mLs (2.5 mg total) by nebulization every 6 (six) hours as needed for wheezing or shortness of breath. 75  mL 1   Alcohol Swabs (B-D SINGLE USE SWABS REGULAR) PADS 1 each by Does not apply route daily. Use as directed to check blood sugar once daily.  Dx code:  E11.65 100 each 0   ALPRAZolam (XANAX) 0.25 MG tablet      ascorbic acid (VITAMIN C) 500 MG tablet Take 500 mg by mouth daily.     aspirin EC 81 MG tablet Take 81 mg by mouth daily.     BD PEN NEEDLE NANO 2ND GEN 32G X 4 MM MISC as directed subcutaneous Three times a day for 90 days     Blood Glucose Monitoring Suppl (ACCU-CHEK AVIVA PLUS) w/Device KIT 1 each by Does not apply route daily. Use as directed to check blood sugar once daily.  Dx code:  E11.65 1 kit 0   candesartan (ATACAND) 4 MG tablet Take 1 tablet (4 mg total) by mouth daily. 30 tablet 11    cholecalciferol (VITAMIN D3) 25 MCG (1000 UNIT) tablet Take 1,000 Units by mouth daily.     Continuous Blood Gluc Receiver ([place]) DEVI by Does not apply route.     Continuous Blood Gluc Sensor (DEXCOM G6 SENSOR) MISC by Does not apply route.     Continuous Blood Gluc Transmit (DEXCOM G6 TRANSMITTER) MISC by Does not apply route.     glucose blood (ACCU-CHEK AVIVA PLUS) test strip 1 each by Other route as needed for other. Use as instructed to check blood sugar once daily.  Dx code:  E11.65 100 each 0   Insulin Pen Needle (PEN NEEDLES) 33G X 4 MM MISC Use as instructed to administer insulin daily. 100 each 1   loratadine (CLARITIN) 10 MG tablet Take 1 tablet (10 mg total) by mouth daily.     meloxicam (MOBIC) 7.5 MG tablet Take by mouth. As needed     morphine (MS CONTIN) 15 MG 12 hr tablet Take 15 mg by mouth daily.     NOVOLOG MIX 70/30 FLEXPEN (70-30) 100 UNIT/ML FlexPen 22 units in the morning, 32 units in the evening     Cyanocobalamin (B-12) 1000 MCG SUBL Place 1 tablet under the tongue once a week.     morphine (MS CONTIN) 30 MG 12 hr tablet 1 tablet Orally every 6 hrs     QUEtiapine (SEROQUEL) 25 MG tablet TAKE 1 TO 2 TABLETS AT BEDTIME 180 tablet 1   No facility-administered medications prior to visit.     Per HPI unless specifically indicated in ROS section below Review of Systems  Constitutional:  Negative for activity change, appetite change, chills, fatigue, fever and unexpected weight change.  HENT:  Negative for hearing loss.   Eyes:  Negative for visual disturbance.  Respiratory:  Negative for cough, chest tightness, shortness of breath and wheezing.   Cardiovascular:  Negative for chest pain, palpitations and leg swelling.  Gastrointestinal:  Negative for abdominal distention, abdominal pain, blood in stool, constipation, diarrhea, nausea and vomiting.  Genitourinary:  Negative for difficulty urinating and hematuria.  Musculoskeletal:  Negative for arthralgias,  back pain, myalgias and neck pain.       Known OA - no significant joint pains  Skin:  Negative for rash.  Neurological:  Positive for dizziness. Negative for seizures, syncope and headaches.  Hematological:  Negative for adenopathy. Does not bruise/bleed easily.  Psychiatric/Behavioral:  Negative for dysphoric mood. The patient is not nervous/anxious.     Objective:  BP 138/86   Pulse (!) 56   Temp 97.6  F (36.4 C) (Temporal)   Ht 4' 11.25" (1.505 m)   Wt 129 lb 6 oz (58.7 kg)   SpO2 96%   BMI 25.91 kg/m   Wt Readings from Last 3 Encounters:  07/23/22 129 lb 6 oz (58.7 kg)  07/06/22 136 lb (61.7 kg)  10/09/21 136 lb (61.7 kg)      Physical Exam Vitals and nursing note reviewed.  Constitutional:      Appearance: Normal appearance. She is not ill-appearing.  HENT:     Head: Normocephalic and atraumatic.     Right Ear: Tympanic membrane, ear canal and external ear normal. There is no impacted cerumen.     Left Ear: Tympanic membrane, ear canal and external ear normal. There is no impacted cerumen.     Mouth/Throat:     Comments: Wearing mask Eyes:     General:        Right eye: No discharge.        Left eye: No discharge.     Extraocular Movements: Extraocular movements intact.     Conjunctiva/sclera: Conjunctivae normal.     Pupils: Pupils are equal, round, and reactive to light.  Neck:     Thyroid: No thyroid mass or thyromegaly.     Vascular: No carotid bruit.  Cardiovascular:     Rate and Rhythm: Normal rate and regular rhythm.     Pulses: Normal pulses.     Heart sounds: Normal heart sounds. No murmur heard. Pulmonary:     Effort: Pulmonary effort is normal. No respiratory distress.     Breath sounds: Normal breath sounds. No wheezing, rhonchi or rales.  Abdominal:     General: Bowel sounds are normal. There is no distension.     Palpations: Abdomen is soft. There is no mass.     Tenderness: There is no abdominal tenderness. There is no guarding or rebound.      Hernia: No hernia is present.  Musculoskeletal:     Cervical back: Normal range of motion and neck supple. No rigidity.     Right lower leg: No edema.     Left lower leg: No edema.  Lymphadenopathy:     Cervical: No cervical adenopathy.  Skin:    General: Skin is warm and dry.     Findings: No rash.  Neurological:     General: No focal deficit present.     Mental Status: She is alert. Mental status is at baseline.  Psychiatric:        Mood and Affect: Mood normal.        Behavior: Behavior normal.       Results for orders placed or performed in visit on 07/16/22  CBC with Differential/Platelet  Result Value Ref Range   WBC 7.7 4.0 - 10.5 K/uL   RBC 4.35 3.87 - 5.11 Mil/uL   Hemoglobin 13.0 12.0 - 15.0 g/dL   HCT 38.8 36.0 - 46.0 %   MCV 89.0 78.0 - 100.0 fl   MCHC 33.5 30.0 - 36.0 g/dL   RDW 14.4 11.5 - 15.5 %   Platelets 334.0 150.0 - 400.0 K/uL   Neutrophils Relative % 64.9 43.0 - 77.0 %   Lymphocytes Relative 22.8 12.0 - 46.0 %   Monocytes Relative 8.7 3.0 - 12.0 %   Eosinophils Relative 2.8 0.0 - 5.0 %   Basophils Relative 0.8 0.0 - 3.0 %   Neutro Abs 5.0 1.4 - 7.7 K/uL   Lymphs Abs 1.8 0.7 - 4.0 K/uL  Monocytes Absolute 0.7 0.1 - 1.0 K/uL   Eosinophils Absolute 0.2 0.0 - 0.7 K/uL   Basophils Absolute 0.1 0.0 - 0.1 K/uL  TSH  Result Value Ref Range   TSH 1.40 0.35 - 5.50 uIU/mL  VITAMIN D 25 Hydroxy (Vit-D Deficiency, Fractures)  Result Value Ref Range   VITD 31.74 30.00 - 100.00 ng/mL  Vitamin B12  Result Value Ref Range   Vitamin B-12 846 211 - 911 pg/mL  Microalbumin / creatinine urine ratio  Result Value Ref Range   Microalb, Ur 1.1 0.0 - 1.9 mg/dL   Creatinine,U 106.7 mg/dL   Microalb Creat Ratio 1.1 0.0 - 30.0 mg/g  Hemoglobin A1c  Result Value Ref Range   Hgb A1c MFr Bld 5.4 4.6 - 6.5 %  Comprehensive metabolic panel  Result Value Ref Range   Sodium 143 135 - 145 mEq/L   Potassium 5.0 3.5 - 5.1 mEq/L   Chloride 107 96 - 112 mEq/L   CO2 27 19  - 32 mEq/L   Glucose, Bld 91 70 - 99 mg/dL   BUN 16 6 - 23 mg/dL   Creatinine, Ser 0.73 0.40 - 1.20 mg/dL   Total Bilirubin 0.4 0.2 - 1.2 mg/dL   Alkaline Phosphatase 69 39 - 117 U/L   AST 16 0 - 37 U/L   ALT 12 0 - 35 U/L   Total Protein 6.6 6.0 - 8.3 g/dL   Albumin 3.9 3.5 - 5.2 g/dL   GFR 75.99 >60.00 mL/min   Calcium 9.6 8.4 - 10.5 mg/dL  Lipid panel  Result Value Ref Range   Cholesterol 171 0 - 200 mg/dL   Triglycerides 96.0 0.0 - 149.0 mg/dL   HDL 37.60 (L) >39.00 mg/dL   VLDL 19.2 0.0 - 40.0 mg/dL   LDL Cholesterol 114 (H) 0 - 99 mg/dL   Total CHOL/HDL Ratio 5    NonHDL 132.99     Assessment & Plan:   Problem List Items Addressed This Visit     Essential hypertension (Chronic)    Chronic, stable only on candesartan 66m daily.       Chronic diastolic (congestive) heart failure (HCC) (Chronic)    Seems euvolemic. Stable period without recent lasix use.       Advanced care planning/counseling discussion (Chronic)    Previously addressed      Health maintenance examination - Primary (Chronic)    Preventative protocols reviewed and updated unless pt declined. Discussed healthy diet and lifestyle.       Ex-smoker    Congratulated on remaining abstinent.       Hx of CABG    Continue aspirin.  Statin intolerance.       Dyslipidemia (statin intolerance) associated with type 2 diabetes mellitus (HCC)    Chronic, stable period off statin and zetia due to statin and zetia intolerance. Not interested in repatha/praluent either.  The ASCVD Risk score (Arnett DK, et al., 2019) failed to calculate for the following reasons:   The 2019 ASCVD risk score is only valid for ages 444to 71      Hyperthyroidism    Sees endo s/p radioactive iodine therapy.       Chronic lower back pain    Followed by pain clinic, overall stable period on MSContin      Dementia, old age, with behavioral disturbance ([place]    Presumed vascular dementia, stable period off med without  change in level of care need. Lives with daughter who is her caregiver.  Relevant Medications   QUEtiapine (SEROQUEL) 25 MG tablet   History of melanoma    Encouraged she call and schedule yearly derm skin check.       Fibromuscular dysplasia of renal artery (HCC)    Stable period with good BP control      Vitamin D deficiency    Continue vit d 1000 IU daily with good control.       COPD (chronic obstructive pulmonary disease) (HCC)    Stable period off regular medication.       Type 2 diabetes mellitus with other specified complication ([place])    Followed by endo, good control to date.       Atherosclerosis of aorta (HCC)    Continue aspirin daily. Statin intolerance.       Liver mass    Benign focal subcapsular fat by MRCP 2020      Chronic pancreatitis [facility])    Reviewed latest MRCP imaging 08/2019 - stable chronic pancreatitis with pancreatic pseudocysts. Plan at that time was return in 1 year for GI f/u. Discussed this - pt is doing well without any abdominal discomfort symptoms or unexpected weight loss, given dementia daughter prefers to just monitor for symptoms rather than return to GI or undergo further imaging - will monitor at this time.       History of deep venous thrombosis (DVT) of distal vein of left lower extremity    Complication of COVID, completed 3 months of anticoagulation on 42021      Low serum vitamin B12    Stable period on current replacement.         Meds ordered this encounter  Medications   QUEtiapine (SEROQUEL) 25 MG tablet    Sig: Take 1-2 tablets (25-50 mg total) by mouth at bedtime.    Dispense:  180 tablet    Refill:  1   Cyanocobalamin (B-12) 1000 MCG SUBL    Sig: Place 1 tablet under the tongue 2 (two) times a week.   polyethylene glycol powder (GLYCOLAX/MIRALAX) 17 GM/SCOOP powder    Sig: Take 17 g by mouth daily.    No orders of the defined types were placed in this encounter.   Patient Instructions  Call  dermatology for yearly skin cancer screening appointment in melanoma history.  You are doing well. Return as needed or in 1 year for next physical/wellness visit.   Follow up plan: Return in about 1 year (around 07/24/2023) for medicare wellness visit, annual exam, prior fasting for blood work.  Ria Bush, MD

## 2022-07-23 NOTE — Assessment & Plan Note (Signed)
Previously addressed.

## 2022-07-23 NOTE — Assessment & Plan Note (Signed)
Preventative protocols reviewed and updated unless pt declined. Discussed healthy diet and lifestyle.  

## 2022-07-24 ENCOUNTER — Encounter: Payer: Self-pay | Admitting: Family Medicine

## 2022-07-24 NOTE — Assessment & Plan Note (Addendum)
Chronic, stable period off statin and zetia due to statin and zetia intolerance. Not interested in repatha/praluent either.  The ASCVD Risk score (Arnett DK, et al., 2019) failed to calculate for the following reasons:   The 2019 ASCVD risk score is only valid for ages 39 to 19

## 2022-07-24 NOTE — Assessment & Plan Note (Signed)
Continue aspirin. Statin intolerance.  

## 2022-07-24 NOTE — Assessment & Plan Note (Addendum)
Presumed vascular dementia, stable period off med without change in level of care need. Lives with daughter who is her caregiver.

## 2022-07-24 NOTE — Assessment & Plan Note (Signed)
Stable period on current replacement.

## 2022-07-24 NOTE — Assessment & Plan Note (Addendum)
Stable period with good BP control

## 2022-07-24 NOTE — Assessment & Plan Note (Signed)
Reviewed latest MRCP imaging 08/2019 - stable chronic pancreatitis with pancreatic pseudocysts. Plan at that time was return in 1 year for GI f/u. Discussed this - pt is doing well without any abdominal discomfort symptoms or unexpected weight loss, given dementia daughter prefers to just monitor for symptoms rather than return to GI or undergo further imaging - will monitor at this time.

## 2022-07-24 NOTE — Assessment & Plan Note (Signed)
Continue aspirin daily. Statin intolerance.

## 2022-07-24 NOTE — Assessment & Plan Note (Signed)
Complication of COVID, completed 3 months of anticoagulation on 42021

## 2022-07-24 NOTE — Assessment & Plan Note (Signed)
Seems euvolemic. Stable period without recent lasix use.

## 2022-07-24 NOTE — Assessment & Plan Note (Signed)
Followed by pain clinic, overall stable period on MSContin

## 2022-07-24 NOTE — Assessment & Plan Note (Signed)
Stable period off regular medication.

## 2022-07-24 NOTE — Assessment & Plan Note (Signed)
Chronic, stable only on candesartan 33m daily.

## 2022-07-24 NOTE — Assessment & Plan Note (Addendum)
Congratulated on remaining abstinent! 

## 2022-07-24 NOTE — Assessment & Plan Note (Signed)
Sees endo s/p radioactive iodine therapy.

## 2022-07-24 NOTE — Assessment & Plan Note (Signed)
Continue vit d 1000 IU daily with good control.

## 2022-07-24 NOTE — Assessment & Plan Note (Signed)
Followed by endo, good control to date.

## 2022-07-24 NOTE — Assessment & Plan Note (Addendum)
Benign focal subcapsular fat by MRCP 2020

## 2022-07-24 NOTE — Assessment & Plan Note (Signed)
Encouraged she call and schedule yearly derm skin check.

## 2022-08-16 DIAGNOSIS — E139 Other specified diabetes mellitus without complications: Secondary | ICD-10-CM | POA: Diagnosis not present

## 2022-08-16 DIAGNOSIS — E0590 Thyrotoxicosis, unspecified without thyrotoxic crisis or storm: Secondary | ICD-10-CM | POA: Diagnosis not present

## 2022-08-23 DIAGNOSIS — E0590 Thyrotoxicosis, unspecified without thyrotoxic crisis or storm: Secondary | ICD-10-CM | POA: Diagnosis not present

## 2022-08-23 DIAGNOSIS — M8580 Other specified disorders of bone density and structure, unspecified site: Secondary | ICD-10-CM | POA: Diagnosis not present

## 2022-08-23 DIAGNOSIS — E139 Other specified diabetes mellitus without complications: Secondary | ICD-10-CM | POA: Diagnosis not present

## 2022-08-23 DIAGNOSIS — I10 Essential (primary) hypertension: Secondary | ICD-10-CM | POA: Diagnosis not present

## 2022-10-01 DIAGNOSIS — M47816 Spondylosis without myelopathy or radiculopathy, lumbar region: Secondary | ICD-10-CM | POA: Diagnosis not present

## 2022-10-01 DIAGNOSIS — F1120 Opioid dependence, uncomplicated: Secondary | ICD-10-CM | POA: Diagnosis not present

## 2022-10-01 DIAGNOSIS — F0390 Unspecified dementia without behavioral disturbance: Secondary | ICD-10-CM | POA: Diagnosis not present

## 2022-10-02 DIAGNOSIS — E1165 Type 2 diabetes mellitus with hyperglycemia: Secondary | ICD-10-CM | POA: Diagnosis not present

## 2022-10-13 ENCOUNTER — Other Ambulatory Visit: Payer: Self-pay | Admitting: Cardiovascular Disease

## 2022-10-26 ENCOUNTER — Other Ambulatory Visit: Payer: Self-pay

## 2022-10-27 MED ORDER — CANDESARTAN CILEXETIL 4 MG PO TABS: 4.0000 mg | ORAL_TABLET | Freq: Every day | ORAL | 0 refills | Status: DC

## 2022-10-27 NOTE — Telephone Encounter (Signed)
They do not make a 2 mg dose, so she should get the 4 mg Rx and take half tab daily.

## 2022-11-11 ENCOUNTER — Ambulatory Visit: Payer: Medicare HMO | Admitting: Gastroenterology

## 2022-11-11 ENCOUNTER — Encounter: Payer: Self-pay | Admitting: Gastroenterology

## 2022-11-11 VITALS — BP 130/80 | HR 64 | Ht 58.75 in | Wt 124.4 lb

## 2022-11-11 DIAGNOSIS — K861 Other chronic pancreatitis: Secondary | ICD-10-CM

## 2022-11-11 DIAGNOSIS — R1310 Dysphagia, unspecified: Secondary | ICD-10-CM

## 2022-11-11 DIAGNOSIS — K862 Cyst of pancreas: Secondary | ICD-10-CM | POA: Diagnosis not present

## 2022-11-11 NOTE — Progress Notes (Signed)
HPI :  82 y/o female accompanied by her daughter today - history of dysphagia, pancreatic cyst, chronic pancreatitis, dementia, here to reestablish care for some of these issues.  I last saw her in April 2021.  Main reason she is here is for dysphagia.  She has had this in the past.  Barium swallow in 2019 suggested dysmotility without stricture/stenosis.  She did undergo an EGD with me in November 2019, no focal stenosis or stricture but she was empirically dilated to 18 mm and this provided significant benefit for years.  She otherwise had a small benign Schwann cell hamartoma at the time.  History is provided by the patient's daughter in light of the patient's dementia.  Daughter reports that she has no problems with liquids but has a hard time with eating breads and meats which can get stuck in her throat when she swallows.  She states occasionally this is regurgitated up.  Denies any reflux symptoms.  Denies any nausea or vomiting.  Denies any postprandial abdominal pains.  No weight loss.  She has no cardiopulmonary symptoms that bother her.  She quit tobacco several years ago.  Again, she thought the EGD with dilation in the past really provided several years worth of benefit.  We discussed how aggressive they want to be with this issue and if she wanted another endoscopy.  She does take morphine for chronic pain for her back/arthritis on a daily basis.  Otherwise recall the patient was hospitalized in 2020 for pneumonia.  She had imaging at that time which showed an abnormal pancreas.  She had an MRCP that required anesthesia to sedate her in light of her dementia, and showed no mass lesions, but she did have evidence of chronic pancreatitis with some pancreatic cystic lesions.  She last had imaging with an MRCP in March 2021 as outlined below.  Radiology had recommended a follow-up imaging study in 2 years.  The patient has developed diabetes since have seen her, she is on insulin.  Again  weight stable, no abdominal pain.  Aunt had pancreatic cancer but no other family history of pancreatic cancer.  We discussed how aggressive they want to be with this issue in regards to surveillance etc.  Recall the patient has never had a prior colonoscopy and never wants 1.  She had a negative Cologuard in November 2018.    She underwent a follow-up MRCP September 04, 2019 as outlined:   MRCP 09/04/19 - IMPRESSION: 1. Chronic pancreatitis. Numerous nonaggressive nonenhancing cystic pancreatic lesions, largest 1.6 cm in the pancreatic head, all stable since 02/22/2019 MRI, likely pancreatic pseudocysts. Follow-up MRI abdomen without and with IV contrast recommended in 2 years. This recommendation follows ACR consensus guidelines: Management of Incidental Pancreatic Cysts: A White Paper of the ACR Incidental Findings Committee. J Am Coll Radiol 2017;14:911-923. 2. Stable left adrenal adenoma. 3.  Aortic Atherosclerosis (ICD10-I70.0).   Past Medical History:  Diagnosis Date   Angina    never needed to take nitroglycerin.   Anxiety    Blood transfusion without reported diagnosis    Cerebral atherosclerosis    Community acquired pneumonia of left lower lobe of lung 12/20/2018   COPD (chronic obstructive pulmonary disease) (HCC)    02-16-13-"pt. denies this"-shows on CXR of 2- 2013.   Coronary artery disease 2003   s/p CABG 2014   COVID-19 virus infection 06/2019   Covid-19 PNA   Dementia (HCC)    Diabetes mellitus without complication (HCC) 04/2018   NEW ONSET  Diastolic CHF, on admit treated. 08/10/2011   Family history of adverse reaction to anesthesia    Dad and Sister had trouble waking up   Fibromuscular dysplasia of renal artery (HCC)    right   Heart murmur    History of deep venous thrombosis (DVT) of distal vein of left lower extremity 07/07/2019   Venous US 06/27/2019 summary:  Right: No evidence of common femoral vein obstruction.  Left: Findings consistent with acute deep  vein thrombosis involving the left popliteal vein. No cystic structure found in the popliteal fossa.  Complication of COVID, completed 3 months of anticoagulation on 42021   History of kidney stones 02/16/2013   "hx. of overgrowth of muscle-causing some stricture"renal artery stenosis-being followed [facility]   Hyperlipidemia    Hypertension    Hyperthyroidism    Balan   Hyponatremia 04/2018   Melanoma (HCC) 2010   skin cancers, 1 melanoma removed back   Osteoarthritis    Pneumonia due to COVID-19 virus 06/26/2019     Past Surgical History:  Procedure Laterality Date   ABDOMINAL HYSTERECTOMY  1970   heavy bleeding   APPENDECTOMY  1960s   CARDIAC CATHETERIZATION  08/09/2011   no sign. coronary obstruction. LIMA is atretic w/excellent flow down the native LAD   CARDIAC CATHETERIZATION  08/22/2001   no sign. coronary stenosis,   CATARACT EXTRACTION, BILATERAL Bilateral    CESAREAN SECTION     CORONARY ARTERY BYPASS GRAFT  02-16-13   3'03-no bypasses -Had 2 aneurysm repairs(stents used)   ESOPHAGOGASTRODUODENOSCOPY  04/2018   LA Grade A esophagitis, small nodule distal esophagus (benign schwann cell hamartoma), empiric dilation to 18mm (Tamsen Reist)   LAPAROTOMY N/A 02/20/2013   Procedure: EXPLORATORY LAPAROTOMY;  [name], MD   LEFT HEART CATHETERIZATION WITH CORONARY ANGIOGRAM N/A 08/09/2011   Procedure: LEFT HEART CATHETERIZATION WITH CORONARY ANGIOGRAM;  Surgeon: Thurmon Fair, MD;  Location: [facility]ATH [facility];  Service: Cardiovascular;  Laterality: N/A;   NM MYOCAR PERF WALL MOTION  07/20/2010   normal   RADIOLOGY WITH ANESTHESIA N/A 02/22/2019   Procedure: MRI OF ABDOMEN WITH AND WITHOUT CONTRAST;  Surgeon: Radiologist, Medication, MD;  Location: [facility] OR;  Service: Radiology;  Laterality: N/A;   RADIOLOGY WITH ANESTHESIA Bilateral 09/04/2019   Procedure: MRI WITH ANESTHESIA OF ABDOMEN WITH AND WITHOUT CONTRAST, BRAIN WITHOUT CONTRAST;  Surgeon: Radiologist, Medication, MD;  Location: [facility] OR;   Service: Radiology;  Laterality: Bilateral;   SALPINGOOPHORECTOMY Bilateral 02/20/2013   benign seromucinous cystadenofibroma R ovary [name], MD)   TOOTH EXTRACTION     TOTAL HIP ARTHROPLASTY Left 2015   Family History  Problem Relation Age of Onset   Stroke Mother    Hypotension Mother    Dementia Mother 65   Coronary artery disease Father    Hypertension Father    Breast cancer Maternal Aunt    Hypotension Sister    Hypertension Brother    Diabetes Brother    Lung cancer Daughter    Thyroid cancer Daughter    Colon polyps Maternal Grandfather    Colon cancer Neg Hx    Esophageal cancer Neg Hx    Stomach cancer Neg Hx    Rectal cancer Neg Hx    Social History   Tobacco Use   Smoking status: Former    Packs/day: 0.10    Years: 20.00    Additional pack years: 0.00    Total pack years: 2.00    Types: Cigarettes    Quit date: 09/14/2017  Years since quitting: 5.1   Smokeless tobacco: Never   Tobacco comments:    smoking cessation info given  Vaping Use   Vaping Use: Never used  Substance Use Topics   Alcohol use: Yes    Comment: occasional   Drug use: No   Current Outpatient Medications  Medication Sig Dispense Refill   albuterol (PROVENTIL) (2.5 MG/3ML) 0.083% nebulizer solution Take 3 mLs (2.5 mg total) by nebulization every 6 (six) hours as needed for wheezing or shortness of breath. 75 mL 1   Alcohol Swabs (B-D SINGLE USE SWABS REGULAR) PADS 1 each by Does not apply route daily. Use as directed to check blood sugar once daily.  Dx code:  E11.65 100 each 0   ascorbic acid (VITAMIN C) 500 MG tablet Take 500 mg by mouth daily.     aspirin EC 81 MG tablet Take 81 mg by mouth daily.     BD PEN NEEDLE NANO 2ND GEN 32G X 4 MM MISC as directed subcutaneous Three times a day for 90 days     Blood Glucose Monitoring Suppl (ACCU-CHEK AVIVA PLUS) w/Device KIT 1 each by Does not apply route daily. Use as directed to check blood sugar once daily.  Dx code:  E11.65 1 kit  0   candesartan (ATACAND) 4 MG tablet Take 1 tablet (4 mg total) by mouth daily. 30 tablet 0   cholecalciferol (VITAMIN D3) 25 MCG (1000 UNIT) tablet Take 1,000 Units by mouth daily.     Continuous Blood Gluc Receiver (DEXCOM G6 RECEIVER) DEVI by Does not apply route.     Continuous Blood Gluc Sensor (DEXCOM G6 SENSOR) MISC by Does not apply route.     Continuous Blood Gluc Transmit (DEXCOM G6 TRANSMITTER) MISC by Does not apply route.     Cyanocobalamin (B-12) 1000 MCG SUBL Place 1 tablet under the tongue 2 (two) times a week.     glucose blood (ACCU-CHEK AVIVA PLUS) test strip 1 each by Other route as needed for other. Use as instructed to check blood sugar once daily.  Dx code:  E11.65 100 each 0   Insulin Pen Needle (PEN NEEDLES) 33G X 4 MM MISC Use as instructed to administer insulin daily. 100 each 1   loratadine (CLARITIN) 10 MG tablet Take 1 tablet (10 mg total) by mouth daily.     meloxicam (MOBIC) 7.5 MG tablet Take by mouth. As needed     morphine (MS CONTIN) 15 MG 12 hr tablet Take 15 mg by mouth daily.     NOVOLOG MIX 70/30 FLEXPEN (70-30) 100 UNIT/ML FlexPen 22 units in the morning, 32 units in the evening     polyethylene glycol powder (GLYCOLAX/MIRALAX) 17 GM/SCOOP powder Take 17 g by mouth daily.     Propylene Glycol (SYSTANE BALANCE) 0.6 % SOLN Apply 1 drop to eye as needed.     QUEtiapine (SEROQUEL) 25 MG tablet Take 1-2 tablets (25-50 mg total) by mouth at bedtime. 180 tablet 1   No current facility-administered medications for this visit.   Allergies  Allergen Reactions   Codeine Anaphylaxis and Shortness Of Breath   Prednisolone Acetate Shortness Of Breath and Nausea Only   Prednisone Hives, Shortness Of Breath and Swelling    Other Reaction(s): Unknown   Nitroglycerin Other (See Comments)    Reaction not recalled    Gabapentin    Metolazone Other (See Comments)    Hypokalemia, hyponatremia   Other Other (See Comments)   Adhesive [Tape] Rash and Other (  See  Comments)    SKIN IS VERY SENSITIVE- TEARS AND BRUISES EASILY, also   Canagliflozin Rash    Other Reaction(s): Unknown   Crestor [Rosuvastatin Calcium] Other (See Comments)    Weakness with statins   Lopressor [Metoprolol Tartrate] Other (See Comments)    Bradycardia    Mupirocin Rash   Sulfamethoxazole Hives and Swelling   Zetia [Ezetimibe] Other (See Comments)    Weakness      Review of Systems: All systems reviewed and negative except where noted in HPI.   Lab Results  Component Value Date   WBC 7.7 07/16/2022   HGB 13.0 07/16/2022   HCT 38.8 07/16/2022   MCV 89.0 07/16/2022   PLT 334.0 07/16/2022    Lab Results  Component Value Date   CREATININE 0.73 07/16/2022   BUN 16 07/16/2022   NA 143 07/16/2022   K 5.0 07/16/2022   CL 107 07/16/2022   CO2 27 07/16/2022    Lab Results  Component Value Date   ALT 12 07/16/2022   AST 16 07/16/2022   ALKPHOS 69 07/16/2022   BILITOT 0.4 07/16/2022     Physical Exam: BP 130/80 (BP Location: Left Arm, Patient Position: Sitting, Cuff Size: Normal)   Pulse 64   Ht 4' 10.75" (1.492 m) Comment: haight measured without shoes  Wt 124 lb 6 oz (56.4 kg)   BMI 25.33 kg/m  Constitutional: Pleasant,well-developed, female in no acute distress. HEENT: Normocephalic and atraumatic. Conjunctivae are normal. No scleral icterus. Neck supple.  Cardiovascular: Normal rate, regular rhythm.  Pulmonary/chest: Effort normal and breath sounds normal. No wheezing, rales or rhonchi. Abdominal: Soft, nondistended, nontender. Bowel sounds active throughout. There are no masses palpable. No hepatomegaly. Extremities: no edema Lymphadenopathy: No cervical adenopathy noted. Skin: Skin is warm and dry. No rashes noted. Psychiatric: Normal mood and affect. Behavior is normal.   ASSESSMENT: 82 y.o. female here for assessment of the following  1. Dysphagia, unspecified type   2. Chronic pancreatitis, unspecified pancreatitis type (HCC)   3.  Pancreatic cyst    History of dysphagia which has worsened over time.  Barium study suggests dysmotility, which could be related to her chronic narcotic use.  No overt stenosis or stricture noted on that exam in 2019 however empiric dilation with EGD was done which resolved her symptoms for a few years.  Symptoms have slowly recurred.  We discussed how aggressive she wanted to be with this and if she wanted to pursue another EGD.  I offered this to her given her symptoms have worsened.  She appears stable from a cardiopulmonary status standpoint without any issues recently.  We discussed risks benefits of the EGD and anesthesia as well as that of dilation.  They understand and want to proceed.  Otherwise, we discussed her pancreatic imaging findings, history of tobacco use which was a risk factor for this.  She does not really have any symptoms concerning for chronic pancreatitis or malignancy, she has developed diabetes, atrophic gland noted on prior imaging.  We discussed if she wanted follow-up imaging for this, and if so what we do with that information.  To do an MRCP requires anesthesia to sedate her and that would be rather aggressive for her, she does have dementia.  We discussed if they wanted to pursue a CT scan of her pancreas as an alternative or even an EUS.  I asked the patient's daughter what we would do with this information, if she had malignancy which she want to know,  and would she want to pursue treatment for that whether it would be chemotherapy or other nonsurgical measures.  We discussed this for a bit, they want to think about this.  They may not pursue treatment if she did have pancreatic cancer or worsening changes of her pancreas, in this light we would probably forego further imaging.  They will think about this and let me know how they want to proceed.  If they did want to pursue anything I recommend CT scan of her pancreas to avoid giving her anesthesia for MRCP.  They agree.  Her  renal function is normal.  She otherwise has no stearrhea or symptoms of chronic pancreatitis otherwise.   PLAN: - schedule EGD at the [facility] for dilation of the esophagus - discussed imaging of the pancreas - would not do MRCP given she would need anesthesia to sedate her for this. We discussed if she wanted to do a CT to re-evaluate things and what we would do with the information - I.e. - if she had cancer would she want this treated, etc. Daughter wants to speak with her brother in regards to how aggressive she wants to be with this issue, they will get back to me with their decision.  Harlin Rain, MD [facility]  CC: Eustaquio Boyden, MD

## 2022-11-11 NOTE — Patient Instructions (Addendum)
If your blood pressure at your visit was 140/90 or greater, please contact your primary care physician to follow up on this. ______________________________________________________  If you are age 84 or older, your body mass index should be between 23-30. Your Body mass index is 25.33 kg/m. If this is out of the aforementioned range listed, please consider follow up with your Primary Care Provider.  If you are age 59 or younger, your body mass index should be between 19-25. Your Body mass index is 25.33 kg/m. If this is out of the aformentioned range listed, please consider follow up with your Primary Care Provider.  ________________________________________________________  The [facility] providers would like to encourage you to use [facility] to communicate with providers for non-urgent requests or questions.  Due to long hold times on the telephone, sending your provider a message by [facility] may be a faster and more efficient way to get a response.  Please allow 48 business hours for a response.  Please remember that this is for non-urgent requests.  _______________________________________________________  Due to recent changes in healthcare laws, you may see the results of your imaging and laboratory studies on MyChart before your provider has had a chance to review them.  We understand that in some cases there may be results that are confusing or concerning to you. Not all laboratory results come back in the same time frame and the provider may be waiting for multiple results in order to interpret others.  Please give [place] 48 hours in order for your provider to thoroughly review all the results before contacting the office for clarification of your results.   You have been scheduled for an endoscopy. Please follow written instructions given to you at your visit today. If you use inhalers (even only as needed), please bring them with you on the day of your procedure.  Thank you for entrusting me with  your care and for choosing [facility], Dr. Ileene Patrick

## 2022-11-16 ENCOUNTER — Ambulatory Visit (AMBULATORY_SURGERY_CENTER): Payer: Medicare HMO | Admitting: Gastroenterology

## 2022-11-16 ENCOUNTER — Encounter: Payer: Self-pay | Admitting: Gastroenterology

## 2022-11-16 VITALS — BP 138/72 | HR 53 | Temp 99.5°F | Resp 7 | Ht <= 58 in | Wt 124.0 lb

## 2022-11-16 DIAGNOSIS — K31819 Angiodysplasia of stomach and duodenum without bleeding: Secondary | ICD-10-CM

## 2022-11-16 DIAGNOSIS — R1310 Dysphagia, unspecified: Secondary | ICD-10-CM

## 2022-11-16 HISTORY — PX: UPPER GASTROINTESTINAL ENDOSCOPY: SHX188

## 2022-11-16 MED ORDER — SODIUM CHLORIDE 0.9 % IV SOLN
500.0000 mL | Freq: Once | INTRAVENOUS | Status: DC
Start: 2022-11-16 — End: 2022-11-16

## 2022-11-16 NOTE — Progress Notes (Signed)
Called to room to assist during endoscopic procedure.  Patient ID and intended procedure confirmed with present staff. Received instructions for my participation in the procedure from the performing physician.  

## 2022-11-16 NOTE — Patient Instructions (Signed)
Resume previous diet Continue present medications Handouts/information given for Hiatal hernia, esophageal dilation  YOU HAD AN ENDOSCOPIC PROCEDURE TODAY AT THE [facility]:   Refer to the procedure report that was given to you for any specific questions about what was found during the examination.  If the procedure report does not answer your questions, please call your gastroenterologist to clarify.  If you requested that your care partner not be given the details of your procedure findings, then the procedure report has been included in a sealed envelope for you to review at your convenience later.  YOU SHOULD EXPECT: Some feelings of bloating in the abdomen. Passage of more gas than usual.  Walking can help get rid of the air that was put into your GI tract during the procedure and reduce the bloating.  Please Note:  You might notice some irritation and congestion in your nose or some drainage.  This is from the oxygen used during your procedure.  There is no need for concern and it should clear up in a day or so.  SYMPTOMS TO REPORT IMMEDIATELY:  Following upper endoscopy (EGD)  Vomiting of blood or coffee ground material  New chest pain or pain under the shoulder blades  Painful or persistently difficult swallowing  New shortness of breath  Fever of 100F or higher  Black, tarry-looking stools  For urgent or emergent issues, a gastroenterologist can be reached at any hour by calling [phone]. Do not use MyChart messaging for urgent concerns.   DIET:  We do recommend a small meal at first, but then you may proceed to your regular diet.  Drink plenty of fluids but you should avoid alcoholic beverages for 24 hours.  ACTIVITY:  You should plan to take it easy for the rest of today and you should NOT DRIVE or use heavy machinery until tomorrow (because of the sedation medicines used during the test).    FOLLOW UP: Our staff will call the number listed on your records  the next business day following your procedure.  We will call around 7:15- 8:00 am to check on you and address any questions or concerns that you may have regarding the information given to you following your procedure. If we do not reach you, we will leave a message.     SIGNATURES/CONFIDENTIALITY: You and/or your care partner have signed paperwork which will be entered into your electronic medical record.  These signatures attest to the fact that that the information above on your After Visit Summary has been reviewed and is understood.  Full responsibility of the confidentiality of this discharge information lies with you and/or your care-partner.

## 2022-11-16 NOTE — Progress Notes (Signed)
Sedate, gd SR's, VSS, report to RN 

## 2022-11-16 NOTE — Op Note (Signed)
[facility] Patient Name: Meredith Gutierrez Procedure Date: 11/16/2022 1:36 PM MRN: [id number] Endoscopist: Viviann Spare P. Adela Lank , MD, [id number] Age: 82 Referring MD:  Date of Birth: 1940/10/02 Gender: Female Account #: [id number] Procedure:                Upper GI endoscopy Indications:              Dysphagia - prior EGD with empiric dilation                            provided benefit, now with recurrent dysphagia Medicines:                Monitored Anesthesia Care Procedure:                Pre-Anesthesia Assessment:                           - Prior to the procedure, a History and Physical                            was performed, and patient medications and                            allergies were reviewed. The patient's tolerance of                            previous anesthesia was also reviewed. The risks                            and benefits of the procedure and the sedation                            options and risks were discussed with the patient.                            All questions were answered, and informed consent                            was obtained. Prior Anticoagulants: The patient has                            taken no anticoagulant or antiplatelet agents. ASA                            Grade Assessment: III - A patient with severe                            systemic disease. After reviewing the risks and                            benefits, the patient was deemed in satisfactory                            condition to undergo the procedure.  After obtaining informed consent, the endoscope was                            passed under direct vision. Throughout the                            procedure, the patient's blood pressure, pulse, and                            oxygen saturations were monitored continuously. The                            Olympus scope [id number] was introduced through the                             mouth, and advanced to the second part of duodenum.                            The upper GI endoscopy was accomplished without                            difficulty. The patient tolerated the procedure                            well. Scope In: Scope Out: Findings:                 Esophagogastric landmarks were identified: the                            Z-line was found at 37 cm, the gastroesophageal                            junction was found at 37 cm and the upper extent of                            the gastric folds was found at 38 cm from the                            incisors.                           A 1 cm hiatal hernia was present.                           The exam of the esophagus was otherwise normal. No                            focal stenosis / stricture noted.                           A guidewire was placed and the scope was withdrawn.                            Empiric  dilation was performed in the entire                            esophagus with a Savary dilator with mild                            resistance at 17 mm and 18 mm. Relook endoscopy                            showed no mucosal wrents.                           The entire examined stomach was normal.                           A single small angiodysplastic lesion was found in                            the duodenal bulb.                           The exam of the duodenum was otherwise normal. Complications:            No immediate complications. Estimated blood loss:                            Minimal. Estimated Blood Loss:     Estimated blood loss was minimal. Impression:               - Esophagogastric landmarks identified.                           - 1 cm hiatal hernia.                           - Normal esophagus otherwise - empiric dilation                            performed to 18mm as outlined.                           - Normal stomach.                           - A single angiodysplastic  lesion in the duodenum.                           - Normal duodenum otherwise. Recommendation:           - Patient has a contact number available for                            emergencies. The signs and symptoms of potential                            delayed complications were discussed with the  patient. Return to normal activities tomorrow.                            Written discharge instructions were provided to the                            patient.                           - Resume previous diet.                           - Continue present medications.                           - Await course post-dilation, follow up as needed                            if persistent symptoms Meredith Gutierrez P. Kadeja Granada, MD 11/16/2022 1:58:00 PM This report has been signed electronically.

## 2022-11-16 NOTE — Progress Notes (Signed)
History and Physical Interval Note: See office note 11/01/22 for details - no interval changes. EGD to further evaluate and dilate her esophagus for symptoms of dysphagia. Have discussed risks / benefits with patient and family in the office and today, they wish to proceed. She denies any complaints today, otherwise feels well.  11/16/2022 1:38 PM  Meredith Gutierrez  has presented today for endoscopic procedure(s), with the diagnosis of  Encounter Diagnosis  Name Primary?   Dysphagia, unspecified type Yes  .  The various methods of evaluation and treatment have been discussed with the patient and/or family. After consideration of risks, benefits and other options for treatment, the patient has consented to  the endoscopic procedure(s).   The patient's history has been reviewed, patient examined, no change in status, stable for surgery.  I have reviewed the patient's chart and labs.  Questions were answered to the patient's satisfaction.    Harlin Rain, MD [facility]

## 2022-11-16 NOTE — Progress Notes (Signed)
VS by HC ? ?Pt's states no medical or surgical changes since previsit or office visit. ? ?

## 2022-11-17 ENCOUNTER — Telehealth: Payer: Self-pay

## 2022-11-17 NOTE — Telephone Encounter (Signed)
  Follow up Call-     11/16/2022    1:08 PM  Call back number  Post procedure Call Back phone  # [phone]- daughter  Permission to leave phone message Yes     Patient questions:  Do you have a fever, pain , or abdominal swelling? No. Pain Score  0 *  Have you tolerated food without any problems? Yes.    Have you been able to return to your normal activities? Yes.    Do you have any questions about your discharge instructions: Diet   No. Medications  No. Follow up visit  No.  Do you have questions or concerns about your Care? No.  Actions: * If pain score is 4 or above: No action needed, pain <4.

## 2022-12-06 DIAGNOSIS — T1512XA Foreign body in conjunctival sac, left eye, initial encounter: Secondary | ICD-10-CM | POA: Diagnosis not present

## 2022-12-06 DIAGNOSIS — H02005 Unspecified entropion of left lower eyelid: Secondary | ICD-10-CM | POA: Diagnosis not present

## 2022-12-06 DIAGNOSIS — H02002 Unspecified entropion of right lower eyelid: Secondary | ICD-10-CM | POA: Diagnosis not present

## 2022-12-19 ENCOUNTER — Other Ambulatory Visit: Payer: Self-pay | Admitting: Family Medicine

## 2022-12-19 DIAGNOSIS — F03918 Unspecified dementia, unspecified severity, with other behavioral disturbance: Secondary | ICD-10-CM

## 2022-12-20 NOTE — Telephone Encounter (Signed)
[facility] Last filled:  10/08/22, #[id number] Last OV:  07/23/22, CPE Next OV:  07/28/22, CPE

## 2022-12-21 NOTE — Telephone Encounter (Signed)
ERx 

## 2022-12-31 DIAGNOSIS — E1165 Type 2 diabetes mellitus with hyperglycemia: Secondary | ICD-10-CM | POA: Diagnosis not present

## 2023-01-10 DIAGNOSIS — L821 Other seborrheic keratosis: Secondary | ICD-10-CM | POA: Diagnosis not present

## 2023-01-10 DIAGNOSIS — L820 Inflamed seborrheic keratosis: Secondary | ICD-10-CM | POA: Diagnosis not present

## 2023-01-13 DIAGNOSIS — F0390 Unspecified dementia without behavioral disturbance: Secondary | ICD-10-CM | POA: Diagnosis not present

## 2023-01-13 DIAGNOSIS — F1120 Opioid dependence, uncomplicated: Secondary | ICD-10-CM | POA: Diagnosis not present

## 2023-01-13 DIAGNOSIS — M47816 Spondylosis without myelopathy or radiculopathy, lumbar region: Secondary | ICD-10-CM | POA: Diagnosis not present

## 2023-01-16 NOTE — Progress Notes (Deleted)
Cardiology Office Note:    Date:  01/16/2023   ID:  DALLAS SCORSONE, DOB 09/25/1940, MRN [id number]  PCP:  Eustaquio Boyden, MD  Cardiologist:  Thurmon Fair, MD  Electrophysiologist:  None   Referring MD: Eustaquio Boyden, MD   Chief Complaint: routine follow-up of CAD and CHF  History of Present Illness:    Meredith Gutierrez is a 82 y.o. female with a history of CAD s/p CABG x2 (LIMA-LAD and SVG-Diag) in 08/2001, chronic HFpEF, hypertension, hyperlipidemia, right renal artery stenosis with evidence of fibromuscular dysplasia, diabetes mellitus, provoked DVT in 2021 during COVID infection, hyperthyroidism, anxiety, and dementia who is followed by Dr. Royann Shivers and presents today for routine follow-up.   Patient has a long history of CAD with remote CABG x2 with LIMA to LAD and SVG to 1st Diag in 2003. 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  Repeat cardiac catheterization in 07/2011 showed an atretic LIMA graft but excellent flow down the native LAD as well as competitive flow from the SVG to Diag. Otherwise, no obstructive CAD. He has not had any coronary events since then. Her initial angiogram in 2003 also showed fibromuscular dysplasia of the right renal artery and she has known stenosis of this vessel. Last renal doppler in 2019 showed >60% stenosis of right renal artery but no stenosis on the left. Last Echo in 12/2018 showed LVEF of 60-65% with mild LVH, normal RV size and function, moderate biatrial enlargement, calcification and thickening of both the aortic and mitral valves but no evidence of stenosis, and mild dilatation of the ascending aorta measuring 41mm. She has had slowly progressive dementia over the last few years. Her daughter Meredith Gutierrez) is her medical power of attorney.   She was last seen by Dr. Royann Shivers in 09/2021 at which time she was doing well from from a cardiac standpoint. Her daughter had started closely  monitoring her sodium intake and as a result she had not required in diuretics any many months.   Patient presents today for follow-up. ***  CAD s/p CABG in 2003 S/p CABG x2 with LIMA to LAD and SVG to 1st Diag in 2003. Most recent cath in 2013 showed atretic LIMA graft but excellent flow down the native LAD as well as competitive flow from the SVG to Diag. Otherwise, no obstructive CAD. - No chest pain. *** - Continue aspirin.  - Intolerant to statin and Zetia in the past.   Chronic HFpEF Last Echo in 12/2018 showed LVEF of 60-65% with mild LVH, normal RV size and function, moderate biatrial enlargement, calcification and thickening of both the aortic and mitral valves but no evidence of stenosis, and mild dilatation of the ascending aorta measuring 41mm. - Euvolemic on exam. *** - No longer requiring diuretics now that sodium intake is well controlled. ***  Hypertension BP well controlled. *** - Continue Candesartan 4mg  daily.  Hyperlipidemia Lipid panel in 07/2022: Total Cholesterol 171, Triglycerides 96, HDL 37, LDL 114. LDL goal <70 given CAD.  - Intolerant to statins and Zetia.  - Felt to be a good candidate for PSCK9 inhibitor at last office visit with Dr. Royann Shivers in 09/2021. ***  Right Renal Artery Stenosis Fibromuscular Dysplasia of Right Renal Artery FMD of right renal artery noted on angiogram in 2003. Last carotid dopplers in 2019 showed >60% stenosis of right renal artery (no mention of FMD) but no stenosis on the left.  - Per Dr. Erin Hearing last note  in 09/2021: "As long as we can control her BP and her renal function remains stable, no reason to consider angioplasty."  History of DVT Diagnosed in 2021 in setting of COVID-19 infection. Completed treatment of anticoagulation.  Dementia Given slowly progressing dementia, appropriate to avoid aggressive or invasive medical procedures. Will defer management to PCP.   EKGs/Labs/Other Studies Reviewed:    The following  studies were reviewed:  Renal Artery Ultrasound 04/20/2018: Summary: Renal:  - Right: Evidence of a greater than 60% stenosis of the right renal artery. Abnormal right Resistive Index.  - Left:  No evidence of left renal artery stenosis. Abnormal left Resisitve Index.  _______________  Echocardiogram 12/21/2018: Impressions: 1. The left ventricle has normal systolic function with an ejection  fraction of 60-65%. The cavity size was normal. There is mild concentric  left ventricular hypertrophy. Left ventricular diastolic Doppler  parameters are consistent with impaired  relaxation. Elevated left ventricular end-diastolic pressure The E/e' is  21. No evidence of left ventricular regional wall motion abnormalities.   2. The right ventricle has normal systolic function. The cavity was  normal. There is no increase in right ventricular wall thickness. Right  ventricular systolic pressure is normal with an estimated pressure of 28.1  mmHg.   3. Left atrial size was moderately dilated.   4. Right atrial size was moderately dilated.   5. Mild thickening of the mitral valve leaflet. Mild calcification of the  mitral valve leaflet. There is mild mitral annular calcification present.  No evidence of mitral valve stenosis.   6. Moderate thickening of the aortic valve. Moderate calcification of the  aortic valve. No stenosis of the aortic valve.   7. The aortic root is normal in size and structure.   8. There is mild dilatation of the ascending aorta measuring 41 mm.   9. The inferior vena cava was dilated in size with >50% respiratory  variability.   EKG:  EKG ordered today. EKG personally reviewed and demonstrates ***.  Recent Labs: 07/16/2022: ALT 12; BUN 16; Creatinine, Ser 0.73; Hemoglobin 13.0; Platelets 334.0; Potassium 5.0; Sodium 143; TSH 1.40  Recent Lipid Panel    Component Value Date/Time   CHOL 171 07/16/2022 0837   TRIG 96.0 07/16/2022 0837   HDL 37.60 (L) 07/16/2022 0837    CHOLHDL 5 07/16/2022 0837   VLDL 19.2 07/16/2022 0837   LDLCALC 114 (H) 07/16/2022 0837   LDLDIRECT 161.0 08/10/2019 0822    Physical Exam:    Vital Signs: There were no vitals taken for this visit.    Wt Readings from Last 3 Encounters:  11/16/22 124 lb (56.2 kg)  11/11/22 124 lb 6 oz (56.4 kg)  07/23/22 129 lb 6 oz (58.7 kg)     General: 82 y.o. female in no acute distress. HEENT: Normocephalic and atraumatic. Sclera clear. EOMs intact. Neck: Supple. No carotid bruits. No JVD. Heart: *** RRR. Distinct S1 and S2. No murmurs, gallops, or rubs. Radial and distal pedal pulses 2+ and equal bilaterally. Lungs: No increased work of breathing. Clear to ausculation bilaterally. No wheezes, rhonchi, or rales.  Abdomen: Soft, non-distended, and non-tender to palpation. Bowel sounds present in all 4 quadrants.  MSK: Normal strength and tone for age. *** Extremities: No lower extremity edema.    Skin: Warm and dry. Neuro: Alert and oriented x3. No focal deficits. Psych: Normal affect. Responds appropriately.   Assessment:    No diagnosis found.  Plan:     Disposition: Follow up in ***  Medication Adjustments/Labs and Tests Ordered: Current medicines are reviewed at length with the patient today.  Concerns regarding medicines are outlined above.  No orders of the defined types were placed in this encounter.  No orders of the defined types were placed in this encounter.   There are no Patient Instructions on file for this visit.   Signed, Corrin Parker, PA-C  01/16/2023 2:48 PM    [facility]

## 2023-01-18 ENCOUNTER — Ambulatory Visit: Payer: Medicare HMO | Admitting: Student

## 2023-01-26 ENCOUNTER — Ambulatory Visit: Payer: Medicare HMO | Attending: Student | Admitting: Physician Assistant

## 2023-01-26 ENCOUNTER — Encounter: Payer: Self-pay | Admitting: Physician Assistant

## 2023-01-26 ENCOUNTER — Encounter: Payer: Self-pay | Admitting: Pharmacist

## 2023-01-26 VITALS — BP 114/76 | HR 72 | Ht 60.0 in | Wt 124.6 lb

## 2023-01-26 DIAGNOSIS — E119 Type 2 diabetes mellitus without complications: Secondary | ICD-10-CM

## 2023-01-26 DIAGNOSIS — I25810 Atherosclerosis of coronary artery bypass graft(s) without angina pectoris: Secondary | ICD-10-CM | POA: Diagnosis not present

## 2023-01-26 DIAGNOSIS — I5032 Chronic diastolic (congestive) heart failure: Secondary | ICD-10-CM

## 2023-01-26 DIAGNOSIS — E785 Hyperlipidemia, unspecified: Secondary | ICD-10-CM

## 2023-01-26 DIAGNOSIS — I10 Essential (primary) hypertension: Secondary | ICD-10-CM | POA: Diagnosis not present

## 2023-01-26 DIAGNOSIS — F03918 Unspecified dementia, unspecified severity, with other behavioral disturbance: Secondary | ICD-10-CM

## 2023-01-26 DIAGNOSIS — I700 Atherosclerosis of aorta: Secondary | ICD-10-CM

## 2023-01-26 DIAGNOSIS — F03C18 Unspecified dementia, severe, with other behavioral disturbance: Secondary | ICD-10-CM | POA: Diagnosis not present

## 2023-01-26 DIAGNOSIS — R001 Bradycardia, unspecified: Secondary | ICD-10-CM

## 2023-01-26 MED ORDER — CANDESARTAN CILEXETIL 4 MG PO TABS: 4.0000 mg | ORAL_TABLET | Freq: Every day | ORAL | 3 refills | Status: DC

## 2023-01-26 NOTE — Patient Instructions (Signed)
Medication Instructions:  Continue same medications *If you need a refill on your cardiac medications before your next appointment, please call your pharmacy*   Lab Work: None ordered   Testing/Procedures: None ordered   Follow-Up: At [facility], you and your health needs are our priority.  As part of our continuing mission to provide you with exceptional heart care, we have created designated Provider Care Teams.  These Care Teams include your primary Cardiologist (physician) and Advanced Practice Providers (APPs -  Physician Assistants and Nurse Practitioners) who all work together to provide you with the care you need, when you need it.  We recommend signing up for the patient portal called "MyChart".  Sign up information is provided on this After Visit Summary.  MyChart is used to connect with patients for Virtual Visits (Telemedicine).  Patients are able to view lab/test results, encounter notes, upcoming appointments, etc.  Non-urgent messages can be sent to your provider as well.   To learn more about what you can do with MyChart, go to [email or web address].    Your next appointment:  1 year    Provider:  Dr.Croitoru

## 2023-01-26 NOTE — Progress Notes (Signed)
Cardiology Office Note:  .   Date:  01/26/2023  ID:  [name], DOB 03/18/1941, MRN [id number] PCP: Eustaquio Boyden, MD  [facility] Providers Cardiologist:  Thurmon Fair, MD     History of Present Illness: .   Meredith Gutierrez is a 82 y.o. female with past medical history of hypertension, hyperlipidemia, DM2, and CAD s/p CABG 2003 with LIMA-LAD and SVG-D1 and the ligation of LAD artery aneurysm.  Subsequent cardiac catheterization in 2013 showed atretic LIMA to patent LAD.  She has history of hyperdynamic LV systolic function, moderate LVH, chronic diastolic heart failure, right renal artery stenosis.  She is intolerant to numerous attempts at lowering cholesterol including Crestor, Zocor, Zetia and others.  She took anticoagulation for provoked DVT during COVID infection in 2021, and has been off of it since.  She has slowly progressive dementia according to daughter and medical power of attorney.  She was last seen by Dr. Royann Shivers in April 2023 at which time she was doing well.  Patient presents today accompanied by his daughter.  She has very poor memory at this time involving both short-term and long-term memory.  She denies any chest discomfort.  Her daughter is managing her food and medications.  She need help with dressing and taking showers.  She does walk around inside home and has not complained of any anginal symptom.  The only cardiac medication she is on is that aspirin and the candesartan.  Blood pressure is very well-controlled.  She is not on any statin medication due to history of intolerance.  I recommended continue on the current therapy.  She can follow-up in 1 year.  ROS:   She denies chest pain, palpitations, dyspnea, pnd, orthopnea, n, v, dizziness, syncope, edema, weight gain, or early satiety. All other systems reviewed and are otherwise negative except as noted above.    Studies Reviewed: .        Cardiac Studies & Procedures     STRESS TESTS  NM  MYOCAR MULTI W/SPECT W 06/28/2007   ECHOCARDIOGRAM  ECHOCARDIOGRAM COMPLETE 12/21/2018  Narrative ECHOCARDIOGRAM REPORT    Patient Name:   Meredith Gutierrez Date of Exam: 12/21/2018 Medical Rec #:  [id number]       Height:       60.0 in Accession #:    [id number]      Weight:       140.0 lb Date of Birth:  [birth year]       BSA:          1.60 m Patient Age:    77 years        BP:           92/55 mmHg Patient Gender: F               HR:           77 bpm. Exam Location:  Inpatient   Procedure: 2D Echo, Cardiac Doppler and Color Doppler  Indications:    Bacteremia.  History:        Patient has prior history of Echocardiogram examinations, most recent 09/01/2017. CHF CAD COPD Signs/Symptoms: Murmur Risk Factors: Hypertension, Diabetes and Dyslipidemia. Cancer. Hypothyroidism.  Sonographer:    Elmarie Shiley Dance Referring Phys: [phone] RALPH A NETTEY  IMPRESSIONS   1. The left ventricle has normal systolic function with an ejection fraction of 60-65%. The cavity size was normal. There is mild concentric left ventricular hypertrophy. Left ventricular diastolic Doppler parameters are consistent with impaired relaxation.  Elevated left ventricular end-diastolic pressure The E/e' is 21. No evidence of left ventricular regional wall motion abnormalities. 2. The right ventricle has normal systolic function. The cavity was normal. There is no increase in right ventricular wall thickness. Right ventricular systolic pressure is normal with an estimated pressure of 28.1 mmHg. 3. Left atrial size was moderately dilated. 4. Right atrial size was moderately dilated. 5. Mild thickening of the mitral valve leaflet. Mild calcification of the mitral valve leaflet. There is mild mitral annular calcification present. No evidence of mitral valve stenosis. 6. Moderate thickening of the aortic valve. Moderate calcification of the aortic valve. No stenosis of the aortic valve. 7. The aortic root is normal in size and  structure. 8. There is mild dilatation of the ascending aorta measuring 41 mm. 9. The inferior vena cava was dilated in size with >50% respiratory variability.  SUMMARY  No apparent vegetation consistent with endocarditis. Study similar to prior. Consider TEE for definitive evaluation if it would change clinical management. FINDINGS Left Ventricle: The left ventricle has normal systolic function, with an ejection fraction of 60-65%. The cavity size was normal. There is mild concentric left ventricular hypertrophy. Left ventricular diastolic Doppler parameters are consistent with impaired relaxation. Elevated left ventricular end-diastolic pressure The E/e' is 21. No evidence of left ventricular regional wall motion abnormalities..  Right Ventricle: The right ventricle has normal systolic function. The cavity was normal. There is no increase in right ventricular wall thickness. Right ventricular systolic pressure is normal with an estimated pressure of 28.1 mmHg.  Left Atrium: Left atrial size was moderately dilated.  Right Atrium: Right atrial size was moderately dilated. Right atrial pressure is estimated at 8 mmHg.  Interatrial Septum: No atrial level shunt detected by color flow Doppler.  Pericardium: There is no evidence of pericardial effusion.  Mitral Valve: The mitral valve is normal in structure. Mild thickening of the mitral valve leaflet. Mild calcification of the mitral valve leaflet. There is mild mitral annular calcification present. Mitral valve regurgitation is trivial by color flow Doppler. No evidence of mitral valve stenosis.  Tricuspid Valve: The tricuspid valve is normal in structure. Tricuspid valve regurgitation is trivial by color flow Doppler.  Aortic Valve: The aortic valve is normal in structure. Moderate thickening of the aortic valve. Moderate calcification of the aortic valve. Aortic valve regurgitation was not visualized by color flow Doppler. There is No  stenosis of the aortic valve.  Pulmonic Valve: The pulmonic valve was grossly normal. Pulmonic valve regurgitation is mild by color flow Doppler. No evidence of pulmonic stenosis.  Aorta: The aortic root is normal in size and structure. There is mild dilatation of the ascending aorta measuring 41 mm.  Pulmonary Artery: The pulmonary artery is not well seen.  Venous: The inferior vena cava is dilated in size with greater than 50% respiratory variability.   +--------------+--------++ LEFT VENTRICLE         +----------------+---------++ +--------------+--------++ Diastology                PLAX 2D                +----------------+---------++ +--------------+--------++ LV e' lateral:  6.96 cm/s LVIDd:        4.12 cm  +----------------+---------++ +--------------+--------++ LV E/e' lateral:18.7      LVIDs:        2.39 cm  +----------------+---------++ +--------------+--------++ LV e' medial:   5.44 cm/s LV PW:        1.23 cm  +----------------+---------++ +--------------+--------++  LV E/e' medial: 23.9      LV IVS:       1.32 cm  +----------------+---------++ +--------------+--------++ LVOT diam:    2.10 cm  +--------------+--------++ LV SV:        55 ml    +--------------+--------++ LV SV Index:  33.23    +--------------+--------++ LVOT Area:    3.46 cm +--------------+--------++                        +--------------+--------++  +---------------+----------++ RIGHT VENTRICLE           +---------------+----------++ RV Basal diam: 2.76 cm    +---------------+----------++ RV S prime:    10.80 cm/s +---------------+----------++ TAPSE (M-mode):1.8 cm     +---------------+----------++ RVSP:          28.1 mmHg  +---------------+----------++  +---------------+-------++-----------++ LEFT ATRIUM           Index       +---------------+-------++-----------++ LA diam:       4.10 cm2.56 cm/m   +---------------+-------++-----------++ LA Vol (A2C):  78.5 ml48.94 ml/m +---------------+-------++-----------++ LA Vol (A4C):  72.6 ml45.26 ml/m +---------------+-------++-----------++ LA Biplane Vol:78.4 ml48.88 ml/m +---------------+-------++-----------++ +------------+---------++-----------++ RIGHT ATRIUM         Index       +------------+---------++-----------++ RA Pressure:8.00 mmHg            +------------+---------++-----------++ RA Area:    18.60 cm            +------------+---------++-----------++ RA Volume:  60.20 ml 37.53 ml/m +------------+---------++-----------++ +------------+-----------++ AORTIC VALVE            +------------+-----------++ LVOT Vmax:  132.00 cm/s +------------+-----------++ LVOT Vmean: 92.300 cm/s +------------+-----------++ LVOT VTI:   0.284 m     +------------+-----------++  +-------------+-------++ AORTA                +-------------+-------++ Ao Root diam:3.50 cm +-------------+-------++ Ao Asc diam: 4.10 cm +-------------+-------++  +--------------+----------++  +---------------+-----------++ MITRAL VALVE              TRICUSPID VALVE            +--------------+----------++  +---------------+-----------++ MV Area (PHT):2.54 cm    TR Peak grad:  20.1 mmHg   +--------------+----------++  +---------------+-----------++ MV PHT:       86.71 msec  TR Vmax:       235.00 cm/s +--------------+----------++  +---------------+-----------++ MV Decel Time:299 msec    Estimated RAP: 8.00 mmHg   +--------------+----------++  +---------------+-----------++ +--------------+-----------++ RVSP:          28.1 mmHg   MV E velocity:130.00 cm/s +---------------+-----------++ +--------------+-----------++ MV A velocity:153.00 cm/s +--------------+-------+ +--------------+-----------++ SHUNTS                MV E/A ratio: 0.85         +--------------+-------+ +--------------+-----------++ Systemic VTI: 0.28 m  +--------------+-------+ Systemic Diam:2.10 cm +--------------+-------+   Jodelle Red MD Electronically signed by Jodelle Red MD Signature Date/Time: 12/21/2018/5:58:17 PM    Final             Risk Assessment/Calculations:             Physical Exam:   VS:  BP 114/76   Pulse 72   Ht 5' (1.524 m)   Wt 124 lb 9.6 oz (56.5 kg)   SpO2 93%   BMI 24.33 kg/m    Wt Readings from Last 3 Encounters:  01/26/23 124 lb 9.6 oz (56.5 kg)  11/16/22 124 lb (56.2 kg)  11/11/22 124 lb 6 oz (56.4 kg)  GEN: Well nourished, well developed in no acute distress NECK: No JVD; No carotid bruits CARDIAC: RRR, no murmurs, rubs, gallops RESPIRATORY:  Clear to auscultation without rales, wheezing or rhonchi  ABDOMEN: Soft, non-tender, non-distended EXTREMITIES:  No edema; No deformity   ASSESSMENT AND PLAN: .    CAD s/p CABG: Denies any chest pain.  Continue aspirin.    Hypertension: Blood pressure stable.  Refill candesartan  Hyperlipidemia: Not on statin therapy.  Given severe dementia and the behavioral disturbance, I did not try to add on more medication at this time  DM2: Managed by primary care provider  Severe dementia: With behavioral disturbance.  Very poor long-term and short-term memory at this point.  She lives with her daughter who takes care of her and is her health care power of attorney.       Dispo: Follow-up in 1 year  Signed, Azalee Course, [place]

## 2023-01-26 NOTE — Progress Notes (Signed)
Pharmacy Quality Measure Review  This patient is appearing on a report for being at risk of failing the adherence measure for hypertension (ACEi/ARB) medications this calendar year.   Medication: candesartan 4 mg  Last fill date: 6/24 for 30 day supply  Patient saw cardiology today, no adherence concerns identified. Refill sent to pharmacy. No action needed at this time.   Catie Eppie Gibson, PharmD, BCACP, CPP Clinical Pharmacist [facility] [phone]

## 2023-02-24 DIAGNOSIS — I10 Essential (primary) hypertension: Secondary | ICD-10-CM | POA: Diagnosis not present

## 2023-02-24 DIAGNOSIS — E0590 Thyrotoxicosis, unspecified without thyrotoxic crisis or storm: Secondary | ICD-10-CM | POA: Diagnosis not present

## 2023-02-24 DIAGNOSIS — E139 Other specified diabetes mellitus without complications: Secondary | ICD-10-CM | POA: Diagnosis not present

## 2023-02-24 DIAGNOSIS — M8580 Other specified disorders of bone density and structure, unspecified site: Secondary | ICD-10-CM | POA: Diagnosis not present

## 2023-02-25 DIAGNOSIS — H43813 Vitreous degeneration, bilateral: Secondary | ICD-10-CM | POA: Diagnosis not present

## 2023-02-25 DIAGNOSIS — E119 Type 2 diabetes mellitus without complications: Secondary | ICD-10-CM | POA: Diagnosis not present

## 2023-02-25 DIAGNOSIS — H40053 Ocular hypertension, bilateral: Secondary | ICD-10-CM | POA: Diagnosis not present

## 2023-02-25 DIAGNOSIS — H02005 Unspecified entropion of left lower eyelid: Secondary | ICD-10-CM | POA: Diagnosis not present

## 2023-02-25 DIAGNOSIS — Z961 Presence of intraocular lens: Secondary | ICD-10-CM | POA: Diagnosis not present

## 2023-02-25 DIAGNOSIS — H02002 Unspecified entropion of right lower eyelid: Secondary | ICD-10-CM | POA: Diagnosis not present

## 2023-03-31 DIAGNOSIS — E1165 Type 2 diabetes mellitus with hyperglycemia: Secondary | ICD-10-CM | POA: Diagnosis not present

## 2023-04-08 DIAGNOSIS — R21 Rash and other nonspecific skin eruption: Secondary | ICD-10-CM | POA: Diagnosis not present

## 2023-04-08 DIAGNOSIS — L0390 Cellulitis, unspecified: Secondary | ICD-10-CM | POA: Diagnosis not present

## 2023-04-08 DIAGNOSIS — N3090 Cystitis, unspecified without hematuria: Secondary | ICD-10-CM | POA: Diagnosis not present

## 2023-04-29 ENCOUNTER — Other Ambulatory Visit: Payer: Self-pay

## 2023-04-29 MED ORDER — CANDESARTAN CILEXETIL 4 MG PO TABS: 4.0000 mg | ORAL_TABLET | Freq: Every day | ORAL | 2 refills | Status: DC

## 2023-05-03 DIAGNOSIS — F1190 Opioid use, unspecified, uncomplicated: Secondary | ICD-10-CM | POA: Diagnosis not present

## 2023-05-03 DIAGNOSIS — M47816 Spondylosis without myelopathy or radiculopathy, lumbar region: Secondary | ICD-10-CM | POA: Diagnosis not present

## 2023-05-03 DIAGNOSIS — F0390 Unspecified dementia without behavioral disturbance: Secondary | ICD-10-CM | POA: Diagnosis not present

## 2023-05-16 ENCOUNTER — Other Ambulatory Visit: Payer: Self-pay | Admitting: Family Medicine

## 2023-05-16 DIAGNOSIS — F03918 Unspecified dementia, unspecified severity, with other behavioral disturbance: Secondary | ICD-10-CM

## 2023-05-18 NOTE — Telephone Encounter (Signed)
ERx 

## 2023-06-20 DIAGNOSIS — R609 Edema, unspecified: Secondary | ICD-10-CM | POA: Diagnosis not present

## 2023-06-29 DIAGNOSIS — E1165 Type 2 diabetes mellitus with hyperglycemia: Secondary | ICD-10-CM | POA: Diagnosis not present

## 2023-07-17 ENCOUNTER — Other Ambulatory Visit: Payer: Self-pay | Admitting: Family Medicine

## 2023-07-17 DIAGNOSIS — E1169 Type 2 diabetes mellitus with other specified complication: Secondary | ICD-10-CM

## 2023-07-17 DIAGNOSIS — E559 Vitamin D deficiency, unspecified: Secondary | ICD-10-CM

## 2023-07-17 DIAGNOSIS — E0590 Thyrotoxicosis, unspecified without thyrotoxic crisis or storm: Secondary | ICD-10-CM

## 2023-07-17 DIAGNOSIS — E538 Deficiency of other specified B group vitamins: Secondary | ICD-10-CM

## 2023-07-22 ENCOUNTER — Other Ambulatory Visit (INDEPENDENT_AMBULATORY_CARE_PROVIDER_SITE_OTHER): Payer: Medicare HMO

## 2023-07-22 DIAGNOSIS — E785 Hyperlipidemia, unspecified: Secondary | ICD-10-CM | POA: Diagnosis not present

## 2023-07-22 DIAGNOSIS — E1169 Type 2 diabetes mellitus with other specified complication: Secondary | ICD-10-CM

## 2023-07-22 DIAGNOSIS — E0590 Thyrotoxicosis, unspecified without thyrotoxic crisis or storm: Secondary | ICD-10-CM

## 2023-07-22 DIAGNOSIS — Z794 Long term (current) use of insulin: Secondary | ICD-10-CM

## 2023-07-22 DIAGNOSIS — E559 Vitamin D deficiency, unspecified: Secondary | ICD-10-CM

## 2023-07-22 DIAGNOSIS — E538 Deficiency of other specified B group vitamins: Secondary | ICD-10-CM | POA: Diagnosis not present

## 2023-07-22 LAB — LIPID PANEL
Cholesterol: 197 mg/dL (ref 0–200)
HDL: 41.5 mg/dL (ref >39.00)
LDL Cholesterol: 127 mg/dL — ABNORMAL HIGH (ref 0–99)
NonHDL: 155.75
Total CHOL/HDL Ratio: 5
Triglycerides: 143 mg/dL (ref 0.0–149.0)
VLDL: 28.6 mg/dL (ref 0.0–40.0)

## 2023-07-22 LAB — TSH: TSH: 1.08 u[IU]/mL (ref 0.35–5.50)

## 2023-07-22 LAB — COMPREHENSIVE METABOLIC PANEL
ALT: 12 U/L (ref 0–35)
AST: 14 U/L (ref 0–37)
Albumin: 4 g/dL (ref 3.5–5.2)
Alkaline Phosphatase: 67 U/L (ref 39–117)
BUN: 19 mg/dL (ref 6–23)
CO2: 27 meq/L (ref 19–32)
Calcium: 9.5 mg/dL (ref 8.4–10.5)
Chloride: 108 meq/L (ref 96–112)
Creatinine, Ser: 0.77 mg/dL (ref 0.40–1.20)
GFR: 70.77 mL/min (ref >60.00)
Glucose, Bld: 96 mg/dL (ref 70–99)
Potassium: 4.7 meq/L (ref 3.5–5.1)
Sodium: 145 meq/L (ref 135–145)
Total Bilirubin: 0.6 mg/dL (ref 0.2–1.2)
Total Protein: 6.6 g/dL (ref 6.0–8.3)

## 2023-07-22 LAB — HEMOGLOBIN A1C: Hgb A1c MFr Bld: 6 % (ref 4.6–6.5)

## 2023-07-22 LAB — VITAMIN D 25 HYDROXY (VIT D DEFICIENCY, FRACTURES): VITD: 30.07 ng/mL (ref 30.00–100.00)

## 2023-07-22 LAB — VITAMIN B12: Vitamin B-12: 756 pg/mL (ref 211–911)

## 2023-07-22 NOTE — Addendum Note (Signed)
 Addended by: Gerry Krone on: 07/22/2023 07:54 AM   Modules accepted: Orders

## 2023-07-29 ENCOUNTER — Ambulatory Visit: Payer: Medicare HMO | Admitting: Family Medicine

## 2023-07-29 ENCOUNTER — Encounter: Payer: Self-pay | Admitting: Family Medicine

## 2023-07-29 ENCOUNTER — Other Ambulatory Visit: Payer: Self-pay

## 2023-07-29 VITALS — BP 166/86 | HR 66 | Temp 97.9°F | Ht 59.5 in | Wt 126.1 lb

## 2023-07-29 DIAGNOSIS — Z85820 Personal history of malignant melanoma of skin: Secondary | ICD-10-CM

## 2023-07-29 DIAGNOSIS — E1169 Type 2 diabetes mellitus with other specified complication: Secondary | ICD-10-CM

## 2023-07-29 DIAGNOSIS — I773 Arterial fibromuscular dysplasia: Secondary | ICD-10-CM | POA: Diagnosis not present

## 2023-07-29 DIAGNOSIS — F05 Delirium due to known physiological condition: Secondary | ICD-10-CM

## 2023-07-29 DIAGNOSIS — Z794 Long term (current) use of insulin: Secondary | ICD-10-CM | POA: Diagnosis not present

## 2023-07-29 DIAGNOSIS — E538 Deficiency of other specified B group vitamins: Secondary | ICD-10-CM

## 2023-07-29 DIAGNOSIS — F03918 Unspecified dementia, unspecified severity, with other behavioral disturbance: Secondary | ICD-10-CM

## 2023-07-29 DIAGNOSIS — Z0000 Encounter for general adult medical examination without abnormal findings: Secondary | ICD-10-CM

## 2023-07-29 DIAGNOSIS — I5032 Chronic diastolic (congestive) heart failure: Secondary | ICD-10-CM | POA: Diagnosis not present

## 2023-07-29 DIAGNOSIS — E0590 Thyrotoxicosis, unspecified without thyrotoxic crisis or storm: Secondary | ICD-10-CM

## 2023-07-29 DIAGNOSIS — H9193 Unspecified hearing loss, bilateral: Secondary | ICD-10-CM

## 2023-07-29 DIAGNOSIS — I10 Essential (primary) hypertension: Secondary | ICD-10-CM

## 2023-07-29 DIAGNOSIS — E559 Vitamin D deficiency, unspecified: Secondary | ICD-10-CM

## 2023-07-29 DIAGNOSIS — M47819 Spondylosis without myelopathy or radiculopathy, site unspecified: Secondary | ICD-10-CM

## 2023-07-29 DIAGNOSIS — Z7189 Other specified counseling: Secondary | ICD-10-CM

## 2023-07-29 DIAGNOSIS — E785 Hyperlipidemia, unspecified: Secondary | ICD-10-CM

## 2023-07-29 DIAGNOSIS — Z951 Presence of aortocoronary bypass graft: Secondary | ICD-10-CM

## 2023-07-29 DIAGNOSIS — Z23 Encounter for immunization: Secondary | ICD-10-CM

## 2023-07-29 DIAGNOSIS — J449 Chronic obstructive pulmonary disease, unspecified: Secondary | ICD-10-CM | POA: Diagnosis not present

## 2023-07-29 DIAGNOSIS — R0989 Other specified symptoms and signs involving the circulatory and respiratory systems: Secondary | ICD-10-CM

## 2023-07-29 LAB — MICROALBUMIN / CREATININE URINE RATIO
Creatinine,U: 89.2 mg/dL
Microalb Creat Ratio: 22.9 mg/g (ref 0.0–30.0)
Microalb, Ur: 2 mg/dL — ABNORMAL HIGH (ref 0.0–1.9)

## 2023-07-29 MED ORDER — QUETIAPINE FUMARATE 50 MG PO TABS: 50.0000 mg | ORAL_TABLET | Freq: Every day | ORAL | 4 refills | Status: DC

## 2023-07-29 NOTE — Patient Instructions (Addendum)
Flu shot today  Consider RSV vaccine through pharmacy  We will order carotid ultrasound to evaluate possible carotid stenosis BP was elevated - today increase atacand back to 8mg  daily. Monitor BP at home, log sheet provided today Good to see you today Return as needed or in 1 year for next physical

## 2023-07-29 NOTE — Progress Notes (Signed)
Ph: [phone] Fax: [phone]   Patient ID: Meredith Gutierrez, female    DOB: 1941/01/08, 83 y.o.   MRN: [id number]  This visit was conducted in person.  BP (!) 166/86   Pulse 66   Temp 97.9 F (36.6 C) (Oral)   Ht 4' 11.5" (1.511 m)   Wt 126 lb 2 oz (57.2 kg)   SpO2 95%   BMI 25.05 kg/m   160/90s on repeat testing   CC: CPE Subjective:   HPI: Meredith Gutierrez is a 83 y.o. female presenting on 07/29/2023 for No chief complaint on file.   Did not see health advisor this year. Planned health advisor appt next Friday.   Hearing Screening - Comments:: Pt not able to comprehend instructions for screening due to dementia.  Vision Screening - Comments:: Last eye exam, Oct/Nov 2024.  Flowsheet Row Clinical Support from 07/06/2022 in [facility] [place]  PHQ-2 Total Score 0          07/29/2023    2:32 PM 07/23/2022    2:25 PM 07/06/2022    2:50 PM 05/04/2021    8:31 AM 08/13/2019    4:20 PM  Fall Risk   Falls in the past year? 0 1 0 0 1  Number falls in past yr:  0 0  1  Injury with Fall?  0 0  0  Risk for fall due to :   No Fall Risks    Follow up   Falls prevention discussed;Falls evaluation completed     Progressive dementia - daughter notes she's becoming a bit more aggressive - pushing caregiver, cussing if she doesn't want to do something. Daughter doesn't want to start anything at this time but will let me know. She continues seroquel 50mg  at bedtime and finds this is working well.   Chronic dCHF - followed by Dr Royann Shivers on PRN furosemide (rare use).  HLD - unable to tolerate statins or ezetimibe - they prefer to remain off treatment.   DM - sees endo Dr Talmage Nap, managing as type 1 with novolog 70/30 22/32u daily - last seen 02/2023.   Saw GI [facility] s/p EGD 11/2022 s/p empiric esophageal dilation due to dysphagia - found 1cm HH and a single angiodysplastic lesion in the duodenum. Chronic pancreatitis - stable period planned conservative  management.   Rare meloxicam use. She continues morphine MS Contin 15mg  once daily   Preventative: Colon cancer screening - cologuard negative 04/2017. Aged out.  Breast cancer screening - mammo 03/2015 WNL. Has been to [facility] previously. Given dementia, decided to age out.  Well woman exam - s/p hysterectomy, ovaries removed age 37, s/p HRT. Age out. No GYN symptoms.  DEXA scan - 2017, normal per daughter done by Dr Talmage Nap, will request records.  Lung cancer screening - not eligible. 1 pack/wk x about 30 yrs.  Flu shot yearly - missed this year  COVID vaccine - Moderna 77/2021, 01/2020, no boosters Pneumovax 2010, 2020, prevnar-13 2016 zostavax 2015 RSV - discussed, declines  Shingrix - discussed, declines  Advanced directive: living will/HCPOA scanned and in chart 03/2017. HCPOA are daughter Okey Regal and son Bethann Berkshire. Does not want prolonged life support if terminal condition.  Seat belt use discussed  Sunscreen use discussed, no changing moles on skin. H/o melanoma - has not returned to dermatologist recently  Ex-smoker - fully quit 2019, previously about 1 ppw.  Alcohol - none Bowel - no constipation - due to morphine - manages  with miralax and stool stoftener prn Bladder - no incontinence   Widow Lives with daughter Okey Regal and daughter's partner Edu: HS Occ: retired, worked at [employer] Activity: no regular exercise Diet: good water      Relevant past medical, surgical, family and social history reviewed and updated as indicated. Interim medical history since our last visit reviewed. Allergies and medications reviewed and updated. Outpatient Medications Prior to Visit  Medication Sig Dispense Refill   albuterol (PROVENTIL) (2.5 MG/3ML) 0.083% nebulizer solution Take 3 mLs (2.5 mg total) by nebulization every 6 (six) hours as needed for wheezing or shortness of breath. 75 mL 1   Alcohol Swabs (B-D SINGLE USE SWABS REGULAR) PADS 1 each by Does not apply route daily. Use as  directed to check blood sugar once daily.  Dx code:  E11.65 100 each 0   ascorbic acid (VITAMIN C) 500 MG tablet Take 500 mg by mouth daily.     aspirin EC 81 MG tablet Take 81 mg by mouth daily.     BD PEN NEEDLE NANO 2ND GEN 32G X 4 MM MISC as directed subcutaneous Three times a day for 90 days     Blood Glucose Monitoring Suppl (ACCU-CHEK AVIVA PLUS) w/Device KIT 1 each by Does not apply route daily. Use as directed to check blood sugar once daily.  Dx code:  E11.65 1 kit 0   candesartan (ATACAND) 4 MG tablet Take 1 tablet (4 mg total) by mouth daily. 90 tablet 2   cholecalciferol (VITAMIN D3) 25 MCG (1000 UNIT) tablet Take 1,000 Units by mouth daily.     Continuous Blood Gluc Receiver (DEXCOM G6 RECEIVER) DEVI by Does not apply route.     Continuous Blood Gluc Sensor (DEXCOM G6 SENSOR) MISC by Does not apply route.     Continuous Blood Gluc Transmit (DEXCOM G6 TRANSMITTER) MISC by Does not apply route.     Cyanocobalamin (B-12) 1000 MCG SUBL Place 1 tablet under the tongue 2 (two) times a week.     glucose blood (ACCU-CHEK AVIVA PLUS) test strip 1 each by Other route as needed for other. Use as instructed to check blood sugar once daily.  Dx code:  E11.65 100 each 0   Insulin Pen Needle (PEN NEEDLES) 33G X 4 MM MISC Use as instructed to administer insulin daily. 100 each 1   loratadine (CLARITIN) 10 MG tablet Take 1 tablet (10 mg total) by mouth daily.     meloxicam (MOBIC) 7.5 MG tablet Take by mouth. As needed     morphine (MS CONTIN) 15 MG 12 hr tablet Take 15 mg by mouth daily.     NOVOLOG MIX 70/30 FLEXPEN (70-30) 100 UNIT/ML FlexPen 22 units in the morning, 32 units in the evening     polyethylene glycol powder (GLYCOLAX/MIRALAX) 17 GM/SCOOP powder Take 17 g by mouth daily.     Propylene Glycol (SYSTANE BALANCE) 0.6 % SOLN Apply 1 drop to eye as needed.     QUEtiapine (SEROQUEL) 25 MG tablet TAKE 1 TO 2 TABLETS AT BEDTIME 180 tablet 0   No facility-administered medications prior to  visit.     Per HPI unless specifically indicated in ROS section below Review of Systems  Unable to perform ROS: Dementia (daughter helps with ROS)  Constitutional:  Negative for activity change, appetite change, chills, fatigue, fever and unexpected weight change.  HENT:  Negative for hearing loss.   Eyes:  Negative for visual disturbance.  Respiratory:  Negative for cough, chest tightness,  shortness of breath and wheezing.   Cardiovascular:  Negative for chest pain, palpitations and leg swelling.  Gastrointestinal:  Negative for abdominal distention, abdominal pain, blood in stool, constipation, diarrhea, nausea and vomiting.  Genitourinary:  Negative for difficulty urinating and hematuria.  Musculoskeletal:  Negative for arthralgias, myalgias and neck pain.  Skin:  Negative for rash.  Neurological:  Negative for dizziness, seizures, syncope and headaches.  Hematological:  Negative for adenopathy. Bruises/bleeds easily.  Psychiatric/Behavioral:  Positive for agitation. Negative for dysphoric mood. The patient is not nervous/anxious.     Objective:  BP (!) 166/86   Pulse 66   Temp 97.9 F (36.6 C) (Oral)   Ht 4' 11.5" (1.511 m)   Wt 126 lb 2 oz (57.2 kg)   SpO2 95%   BMI 25.05 kg/m   Wt Readings from Last 3 Encounters:  07/29/23 126 lb 2 oz (57.2 kg)  01/26/23 124 lb 9.6 oz (56.5 kg)  11/16/22 124 lb (56.2 kg)      Physical Exam Vitals and nursing note reviewed.  Constitutional:      Appearance: Normal appearance. She is not ill-appearing.  HENT:     Head: Normocephalic and atraumatic.     Right Ear: Tympanic membrane, ear canal and external ear normal. Decreased hearing noted. There is impacted cerumen.     Left Ear: Tympanic membrane, ear canal and external ear normal. Decreased hearing noted. There is impacted cerumen.     Mouth/Throat:     Mouth: Mucous membranes are moist.     Pharynx: Oropharynx is clear. No oropharyngeal exudate or posterior oropharyngeal  erythema.  Eyes:     General:        Right eye: No discharge.        Left eye: No discharge.     Extraocular Movements: Extraocular movements intact.     Conjunctiva/sclera: Conjunctivae normal.     Pupils: Pupils are equal, round, and reactive to light.  Neck:     Thyroid: No thyroid mass or thyromegaly.     Vascular: Carotid bruit (left) present.  Cardiovascular:     Rate and Rhythm: Normal rate and regular rhythm.     Pulses: Normal pulses.     Heart sounds: Normal heart sounds. No murmur heard. Pulmonary:     Effort: Pulmonary effort is normal. No respiratory distress.     Breath sounds: Normal breath sounds. No wheezing, rhonchi or rales.  Abdominal:     General: Bowel sounds are normal. There is no distension.     Palpations: Abdomen is soft. There is no mass.     Tenderness: There is no abdominal tenderness. There is no guarding or rebound.     Hernia: No hernia is present.  Musculoskeletal:     Cervical back: Normal range of motion and neck supple. No rigidity.     Right lower leg: No edema.     Left lower leg: No edema.  Lymphadenopathy:     Cervical: No cervical adenopathy.  Skin:    General: Skin is warm and dry.     Findings: No rash.  Neurological:     General: No focal deficit present.     Mental Status: She is alert. Mental status is at baseline.  Psychiatric:        Mood and Affect: Mood normal.        Behavior: Behavior normal.       Results for orders placed or performed in visit on 07/29/23  Microalbumin / creatinine urine  ratio   Collection Time: 07/29/23  2:13 PM  Result Value Ref Range   Microalb, Ur 2.0 (H) 0.0 - 1.9 mg/dL   Creatinine,U 16.1 mg/dL   Microalb Creat Ratio 22.9 0.0 - 30.0 mg/g   Lab Results  Component Value Date   CHOL 197 07/22/2023   HDL 41.50 07/22/2023   LDLCALC 127 (H) 07/22/2023   LDLDIRECT 161.0 08/10/2019   TRIG 143.0 07/22/2023   CHOLHDL 5 07/22/2023    Lab Results  Component Value Date   NA 145 07/22/2023    CL 108 07/22/2023   K 4.7 07/22/2023   CO2 27 07/22/2023   BUN 19 07/22/2023   CREATININE 0.77 07/22/2023   GFR 70.77 07/22/2023   CALCIUM 9.5 07/22/2023   ALBUMIN 4.0 07/22/2023   GLUCOSE 96 07/22/2023    Assessment & Plan:   Problem List Items Addressed This Visit     Essential hypertension (Chronic)   Chronic, deteriorated only on candesartan 4mg  daily.  Discussed tracking BP at home, bp log sheet provided.  I asked her to drop off readings in 1-2 wks and if consistently elevated, low threshold to titration antihypertensive. Continue limiting salt/sodium in diet.       Chronic diastolic (congestive) heart failure (HCC) (Chronic)   Seems euvolemic.       Advanced care planning/counseling discussion (Chronic)   Previously discussed       Health maintenance examination (Chronic)   Preventative protocols reviewed and updated unless pt declined. Discussed healthy diet and lifestyle.       Medicare annual wellness visit, subsequent - Primary (Chronic)   I have personally reviewed the Medicare Annual Wellness questionnaire and have noted 1. The patient's medical and social history 2. Their use of alcohol, tobacco or illicit drugs 3. Their current medications and supplements 4. The patient's functional ability including ADL's, fall risks, home safety risks and hearing or visual impairment. Cognitive function has been assessed and addressed as indicated.  5. Diet and physical activity 6. Evidence for depression or mood disorders The patients weight, height, BMI have been recorded in the chart. I have made referrals, counseling and provided education to the patient based on review of the above and I have provided the pt with a written personalized care plan for preventive services. Provider list updated.. See scanned questionairre as needed for further documentation. Reviewed preventative protocols and updated unless pt declined.       Hx of CABG   Dyslipidemia (statin  intolerance) associated with type 2 diabetes mellitus (HCC)   Statin, zetia intolerance. H/o CAD. They will trial RYR 600mg  BID OTC. Consider bempedoic acid (monitoring for gout, tendon issues). Update carotid US.  The ASCVD Risk score (Arnett DK, et al., 2019) failed to calculate for the following reasons:   The 2019 ASCVD risk score is only valid for ages 77 to 33       Hyperthyroidism   S/p radioactive iodine therapy, followed by endo Dr Talmage Nap      Dementia, old age, with behavioral disturbance (HCC)   Chronic, some progression noted. Presumed vascular. Stable period, has caregiver 4d/wk during the day. Discussed possible addition of medication such as SSRI if progression of aggression/agitation.  Continue seroquel 50mg  nightly for sundowning which is effective.       Relevant Medications   QUEtiapine (SEROQUEL) 50 MG tablet   History of melanoma   Encourage yearly derm check.      Fibromuscular dysplasia of renal artery (HCC)   H/o this. Check  carotid US for newly noted carotid bruit on left      Vitamin D deficiency   Continue vit D 1000 units daily.       Decreased hearing, bilateral   Noted cerumen impaction bilaterally - daughter will try OTC debrox drops      Sundowning   Continue seroquel nightly      COPD (chronic obstructive pulmonary disease) (HCC)   Stable period off respiratory medication      Type 2 diabetes mellitus with other specified complication (HCC)   Chronic, stable followed by endo Dr Talmage Nap.       Low serum vitamin B12   Continue vit b12 replacement      Spondylosis without myelopathy   On low dose MS Contin med through pain clinic.       Bruit of left carotid artery   Louder - will update carotid US. She does have h/o fibromuscular dysplasia of renal artery. She has had trouble tolerating statin, zetia.       Relevant Orders   VAS US CAROTID   Other Visit Diagnoses       Encounter for immunization       Relevant Orders   Flu  Vaccine Trivalent High Dose (Fluad) (Completed)        Meds ordered this encounter  Medications   QUEtiapine (SEROQUEL) 50 MG tablet    Sig: Take 1 tablet (50 mg total) by mouth at bedtime.    Dispense:  90 tablet    Refill:  4    Orders Placed This Encounter  Procedures   Flu Vaccine Trivalent High Dose (Fluad)    Patient Instructions  Flu shot today  Consider RSV vaccine through pharmacy  We will order carotid ultrasound to evaluate possible carotid stenosis BP was elevated - today increase atacand back to 8mg  daily. Monitor BP at home, log sheet provided today Good to see you today Return as needed or in 1 year for next physical  Follow up plan: Return in about 1 year (around 07/28/2024) for annual exam, prior fasting for blood work.  Eustaquio Boyden, MD

## 2023-07-30 ENCOUNTER — Other Ambulatory Visit: Payer: Self-pay | Admitting: Family Medicine

## 2023-07-30 ENCOUNTER — Telehealth: Payer: Self-pay | Admitting: Family Medicine

## 2023-07-30 ENCOUNTER — Encounter: Payer: Self-pay | Admitting: Family Medicine

## 2023-07-30 DIAGNOSIS — R0989 Other specified symptoms and signs involving the circulatory and respiratory systems: Secondary | ICD-10-CM | POA: Insufficient documentation

## 2023-07-30 DIAGNOSIS — I6523 Occlusion and stenosis of bilateral carotid arteries: Secondary | ICD-10-CM | POA: Insufficient documentation

## 2023-07-30 NOTE — Assessment & Plan Note (Signed)

## 2023-07-30 NOTE — Assessment & Plan Note (Signed)
Noted cerumen impaction bilaterally - daughter will try OTC debrox drops

## 2023-07-30 NOTE — Assessment & Plan Note (Addendum)
Statin, zetia intolerance. H/o CAD. They will trial RYR 600mg  BID OTC. Consider bempedoic acid (monitoring for gout, tendon issues). Update carotid US.  The ASCVD Risk score (Arnett DK, et al., 2019) failed to calculate for the following reasons:   The 2019 ASCVD risk score is only valid for ages 48 to 38

## 2023-07-30 NOTE — Assessment & Plan Note (Signed)
 Previously discussed.

## 2023-07-30 NOTE — Assessment & Plan Note (Signed)
Encourage yearly derm check.

## 2023-07-30 NOTE — Assessment & Plan Note (Signed)
 Preventative protocols reviewed and updated unless pt declined. Discussed healthy diet and lifestyle.

## 2023-07-30 NOTE — Assessment & Plan Note (Signed)
H/o this. Check carotid US for newly noted carotid bruit on left

## 2023-07-30 NOTE — Assessment & Plan Note (Addendum)
Chronic, some progression noted. Presumed vascular. Stable period, has caregiver 4d/wk during the day. Discussed possible addition of medication such as SSRI if progression of aggression/agitation.  Continue seroquel 50mg  nightly for sundowning which is effective.

## 2023-07-30 NOTE — Telephone Encounter (Signed)
Plz notify daughter - we completed medicare wellness visit at [facility] on Friday. Therefore I will cancel scheduled appt for Friday with health advisor.

## 2023-07-30 NOTE — Assessment & Plan Note (Signed)
Continue vit b12 replacement

## 2023-07-30 NOTE — Assessment & Plan Note (Signed)
 Continue vit D 1000 units daily.

## 2023-07-30 NOTE — Assessment & Plan Note (Signed)
Chronic, stable followed by endo Dr Talmage Nap.

## 2023-07-30 NOTE — Assessment & Plan Note (Signed)
Stable period off respiratory medication. 

## 2023-07-30 NOTE — Assessment & Plan Note (Addendum)
Louder - will update carotid US. She does have h/o fibromuscular dysplasia of renal artery. She has had trouble tolerating statin, zetia.

## 2023-07-30 NOTE — Assessment & Plan Note (Addendum)
On low dose MS Contin med through pain clinic.

## 2023-07-30 NOTE — Assessment & Plan Note (Signed)
 Seems euvolemic.

## 2023-07-30 NOTE — Assessment & Plan Note (Addendum)
Chronic, deteriorated only on candesartan 4mg  daily.  Discussed tracking BP at home, bp log sheet provided.  I asked her to drop off readings in 1-2 wks and if consistently elevated, low threshold to titration antihypertensive. Continue limiting salt/sodium in diet.

## 2023-07-30 NOTE — Assessment & Plan Note (Signed)
Continue seroquel nightly

## 2023-07-30 NOTE — Assessment & Plan Note (Addendum)
S/p radioactive iodine therapy, followed by endo Dr Talmage Nap

## 2023-08-01 NOTE — Telephone Encounter (Signed)
Dose changed to 50 mg daily at bedtime (see 07/29/23 [facility] notes). New rx sent on 07/29/23, #90/4 refills to [facility] order pharmacy.   Request denied.

## 2023-08-01 NOTE — Telephone Encounter (Signed)
Looks like appt was c/x.

## 2023-08-02 DIAGNOSIS — M5416 Radiculopathy, lumbar region: Secondary | ICD-10-CM | POA: Diagnosis not present

## 2023-08-02 DIAGNOSIS — F0390 Unspecified dementia without behavioral disturbance: Secondary | ICD-10-CM | POA: Diagnosis not present

## 2023-08-02 DIAGNOSIS — F1190 Opioid use, unspecified, uncomplicated: Secondary | ICD-10-CM | POA: Diagnosis not present

## 2023-08-02 DIAGNOSIS — M47816 Spondylosis without myelopathy or radiculopathy, lumbar region: Secondary | ICD-10-CM | POA: Diagnosis not present

## 2023-08-03 NOTE — Progress Notes (Unsigned)
Cardiology Office Note    Date:  08/04/2023  ID:  Meredith, Gutierrez Oct 01, 1940, MRN [id number] PCP:  Eustaquio Boyden, MD  Cardiologist:  Thurmon Fair, MD  Electrophysiologist:  None   Chief Complaint: Hypertension   History of Present Illness: .    Meredith Gutierrez is a 83 y.o. female with visit-pertinent history of hypertension, hyperlipidemia, type 2 diabetes mellitus and CAD s/p CABG in 2003 with LIMA to LAD and SVG to D1 and ligation of LAD artery aneurysm.  Cardiac catheterization in 2013 indicated atretic LIMA to patent LAD.  Patient has history of hyperdynamic LV systolic function, moderate LVH, chronic diastolic heart failure, right renal artery stenosis.  She also has history of statin intolerance including Crestor, Zocor, Zetia and other cholesterol-lowering medications.  In 2021 she was on anticoagulation for provoked DVT during COVID infection, she is no longer on anticoagulation.  Patient also has progressive dementia.  [name] was last seen in clinic on 01/26/2023 accompanied by her daughter.  It was noted that she denied any anginal symptoms.  Her blood pressure was very well-controlled.  It was recommended that she have 1 year follow-up.  Today [name] presents with her daughter, her daughter notes that patient's dementia has been progressing, she is becoming increasingly agitated and aggressive in the last month.  Her daughter provides patient's history.  She notes that last week they presented to her PCPs office and patient's blood pressure was elevated at 166/86.  She had not been monitoring blood pressures at home.  Patient's PCP increased her candesartan to 8 mg daily.  Patient's daughter presents blood pressure log for the last 5 days showing variable blood pressure ranging from 110/81 to 165/87, there is 1 outlier of 201/31.  Her daughter notes that she is been taking her blood pressure prior to taking her medications.  With further discussion her daughter notes  that for the last month the patient has been eating more canned soups and sandwiches, plans to try to decrease salt intake.  Her daughter reports that patient has not made comment on any chest pain or shortness of breath, she denies seeing any increased lower extremity edema.  She notes that she is not very active at home, mostly folds washcloths or towels throughout the day.  She is able to get around and walk around in the house with the assistance of a caregiver.  Labwork independently reviewed: 07/22/2023: Sodium 145, potassium 4.7, creatinine 0.77, AST 14, ALT 12 ROS: .   Today she denies chest pain, shortness of breath, lower extremity edema, fatigue, palpitations, melena, hematuria, hemoptysis, diaphoresis, weakness, presyncope, syncope, orthopnea, and PND.  All other systems are reviewed and otherwise negative. Studies Reviewed: Marland Kitchen   EKG:  EKG is ordered today, personally reviewed, demonstrating  EKG Interpretation Date/Time:  Thursday August 04 2023 10:29:42 EST Ventricular Rate:  64 PR Interval:  156 QRS Duration:  80 QT Interval:  420 QTC Calculation: 433 R Axis:   6  Text Interpretation: Normal sinus rhythm Q wave in inferior leads Nonspecific T wave abnormality inferior leads No significant change since last tracing Confirmed by [name] [phone]) on 08/04/2023 10:35:22 AM   CV Studies:  Cardiac Studies & Procedures   ______________________________________________________________________________________________   STRESS TESTS  NM MYOCAR MULTI W/SPECT W 06/28/2007   ECHOCARDIOGRAM  ECHOCARDIOGRAM COMPLETE 12/21/2018  Narrative ECHOCARDIOGRAM REPORT    Patient Name:   Meredith Gutierrez Date of Exam: 12/21/2018 Medical Rec #:  [id number]  Height:       60.0 in Accession #:    [id number]      Weight:       140.0 lb Date of Birth:  04/22/1941       BSA:          1.60 m Patient Age:    77 years        BP:           92/55 mmHg Patient Gender: F               HR:            77 bpm. Exam Location:  Inpatient   Procedure: 2D Echo, Cardiac Doppler and Color Doppler  Indications:    Bacteremia.  History:        Patient has prior history of Echocardiogram examinations, most recent 09/01/2017. CHF CAD COPD Signs/Symptoms: Murmur Risk Factors: Hypertension, Diabetes and Dyslipidemia. Cancer. Hypothyroidism.  Sonographer:    Elmarie Shiley Dance Referring Phys: [phone] RALPH A NETTEY  IMPRESSIONS   1. The left ventricle has normal systolic function with an ejection fraction of 60-65%. The cavity size was normal. There is mild concentric left ventricular hypertrophy. Left ventricular diastolic Doppler parameters are consistent with impaired relaxation. Elevated left ventricular end-diastolic pressure The E/e' is 21. No evidence of left ventricular regional wall motion abnormalities. 2. The right ventricle has normal systolic function. The cavity was normal. There is no increase in right ventricular wall thickness. Right ventricular systolic pressure is normal with an estimated pressure of 28.1 mmHg. 3. Left atrial size was moderately dilated. 4. Right atrial size was moderately dilated. 5. Mild thickening of the mitral valve leaflet. Mild calcification of the mitral valve leaflet. There is mild mitral annular calcification present. No evidence of mitral valve stenosis. 6. Moderate thickening of the aortic valve. Moderate calcification of the aortic valve. No stenosis of the aortic valve. 7. The aortic root is normal in size and structure. 8. There is mild dilatation of the ascending aorta measuring 41 mm. 9. The inferior vena cava was dilated in size with >50% respiratory variability.  SUMMARY  No apparent vegetation consistent with endocarditis. Study similar to prior. Consider TEE for definitive evaluation if it would change clinical management. FINDINGS Left Ventricle: The left ventricle has normal systolic function, with an ejection fraction of 60-65%. The cavity  size was normal. There is mild concentric left ventricular hypertrophy. Left ventricular diastolic Doppler parameters are consistent with impaired relaxation. Elevated left ventricular end-diastolic pressure The E/e' is 21. No evidence of left ventricular regional wall motion abnormalities..  Right Ventricle: The right ventricle has normal systolic function. The cavity was normal. There is no increase in right ventricular wall thickness. Right ventricular systolic pressure is normal with an estimated pressure of 28.1 mmHg.  Left Atrium: Left atrial size was moderately dilated.  Right Atrium: Right atrial size was moderately dilated. Right atrial pressure is estimated at 8 mmHg.  Interatrial Septum: No atrial level shunt detected by color flow Doppler.  Pericardium: There is no evidence of pericardial effusion.  Mitral Valve: The mitral valve is normal in structure. Mild thickening of the mitral valve leaflet. Mild calcification of the mitral valve leaflet. There is mild mitral annular calcification present. Mitral valve regurgitation is trivial by color flow Doppler. No evidence of mitral valve stenosis.  Tricuspid Valve: The tricuspid valve is normal in structure. Tricuspid valve regurgitation is trivial by color flow Doppler.  Aortic Valve: The aortic valve is normal  in structure. Moderate thickening of the aortic valve. Moderate calcification of the aortic valve. Aortic valve regurgitation was not visualized by color flow Doppler. There is No stenosis of the aortic valve.  Pulmonic Valve: The pulmonic valve was grossly normal. Pulmonic valve regurgitation is mild by color flow Doppler. No evidence of pulmonic stenosis.  Aorta: The aortic root is normal in size and structure. There is mild dilatation of the ascending aorta measuring 41 mm.  Pulmonary Artery: The pulmonary artery is not well seen.  Venous: The inferior vena cava is dilated in size with greater than 50% respiratory  variability.   +--------------+--------++ LEFT VENTRICLE         +----------------+---------++ +--------------+--------++ Diastology                PLAX 2D                +----------------+---------++ +--------------+--------++ LV e' lateral:  6.96 cm/s LVIDd:        4.12 cm  +----------------+---------++ +--------------+--------++ LV E/e' lateral:18.7      LVIDs:        2.39 cm  +----------------+---------++ +--------------+--------++ LV e' medial:   5.44 cm/s LV PW:        1.23 cm  +----------------+---------++ +--------------+--------++ LV E/e' medial: 23.9      LV IVS:       1.32 cm  +----------------+---------++ +--------------+--------++ LVOT diam:    2.10 cm  +--------------+--------++ LV SV:        55 ml    +--------------+--------++ LV SV Index:  33.23    +--------------+--------++ LVOT Area:    3.46 cm +--------------+--------++                        +--------------+--------++  +---------------+----------++ RIGHT VENTRICLE           +---------------+----------++ RV Basal diam: 2.76 cm    +---------------+----------++ RV S prime:    10.80 cm/s +---------------+----------++ TAPSE (M-mode):1.8 cm     +---------------+----------++ RVSP:          28.1 mmHg  +---------------+----------++  +---------------+-------++-----------++ LEFT ATRIUM           Index       +---------------+-------++-----------++ LA diam:       4.10 cm2.56 cm/m  +---------------+-------++-----------++ LA Vol (A2C):  78.5 ml48.94 ml/m +---------------+-------++-----------++ LA Vol (A4C):  72.6 ml45.26 ml/m +---------------+-------++-----------++ LA Biplane Vol:78.4 ml48.88 ml/m +---------------+-------++-----------++ +------------+---------++-----------++ RIGHT ATRIUM         Index       +------------+---------++-----------++ RA Pressure:8.00 mmHg             +------------+---------++-----------++ RA Area:    18.60 cm            +------------+---------++-----------++ RA Volume:  60.20 ml 37.53 ml/m +------------+---------++-----------++ +------------+-----------++ AORTIC VALVE            +------------+-----------++ LVOT Vmax:  132.00 cm/s +------------+-----------++ LVOT Vmean: 92.300 cm/s +------------+-----------++ LVOT VTI:   0.284 m     +------------+-----------++  +-------------+-------++ AORTA                +-------------+-------++ Ao Root diam:3.50 cm +-------------+-------++ Ao Asc diam: 4.10 cm +-------------+-------++  +--------------+----------++  +---------------+-----------++ MITRAL VALVE              TRICUSPID VALVE            +--------------+----------++  +---------------+-----------++ MV Area (PHT):2.54 cm    TR Peak grad:  20.1 mmHg   +--------------+----------++  +---------------+-----------++ MV PHT:  86.71 msec  TR Vmax:       235.00 cm/s +--------------+----------++  +---------------+-----------++ MV Decel Time:299 msec    Estimated RAP: 8.00 mmHg   +--------------+----------++  +---------------+-----------++ +--------------+-----------++ RVSP:          28.1 mmHg   MV E velocity:130.00 cm/s +---------------+-----------++ +--------------+-----------++ MV A velocity:153.00 cm/s +--------------+-------+ +--------------+-----------++ SHUNTS                MV E/A ratio: 0.85        +--------------+-------+ +--------------+-----------++ Systemic VTI: 0.28 m  +--------------+-------+ Systemic Diam:2.10 cm +--------------+-------+   Jodelle Red MD Electronically signed by Jodelle Red MD Signature Date/Time: 12/21/2018/5:58:17 PM    Final          ______________________________________________________________________________________________      Current Reported Medications:.     Current Meds  Medication Sig   albuterol (PROVENTIL) (2.5 MG/3ML) 0.083% nebulizer solution Take 3 mLs (2.5 mg total) by nebulization every 6 (six) hours as needed for wheezing or shortness of breath.   Alcohol Swabs (B-D SINGLE USE SWABS REGULAR) PADS 1 each by Does not apply route daily. Use as directed to check blood sugar once daily.  Dx code:  E11.65   ascorbic acid (VITAMIN C) 500 MG tablet Take 500 mg by mouth daily.   aspirin EC 81 MG tablet Take 81 mg by mouth daily.   BD PEN NEEDLE NANO 2ND GEN 32G X 4 MM MISC as directed subcutaneous Three times a day for 90 days   Blood Glucose Monitoring Suppl (ACCU-CHEK AVIVA PLUS) w/Device KIT 1 each by Does not apply route daily. Use as directed to check blood sugar once daily.  Dx code:  E11.65   candesartan (ATACAND) 4 MG tablet Take 1 tablet (4 mg total) by mouth daily.   cholecalciferol (VITAMIN D3) 25 MCG (1000 UNIT) tablet Take 1,000 Units by mouth daily.   Continuous Blood Gluc Receiver (DEXCOM G6 RECEIVER) DEVI by Does not apply route.   Continuous Blood Gluc Sensor (DEXCOM G6 SENSOR) MISC by Does not apply route.   Continuous Blood Gluc Transmit (DEXCOM G6 TRANSMITTER) MISC by Does not apply route.   Cyanocobalamin (B-12) 1000 MCG SUBL Place 1 tablet under the tongue 2 (two) times a week.   glucose blood (ACCU-CHEK AVIVA PLUS) test strip 1 each by Other route as needed for other. Use as instructed to check blood sugar once daily.  Dx code:  E11.65   Insulin Pen Needle (PEN NEEDLES) 33G X 4 MM MISC Use as instructed to administer insulin daily.   loratadine (CLARITIN) 10 MG tablet Take 1 tablet (10 mg total) by mouth daily.   meloxicam (MOBIC) 7.5 MG tablet Take by mouth. As needed   morphine (MS CONTIN) 15 MG 12 hr tablet Take 15 mg by mouth daily.   NOVOLOG MIX 70/30 FLEXPEN (70-30) 100 UNIT/ML FlexPen 22 units in the morning, 32 units in the evening   polyethylene glycol powder (GLYCOLAX/MIRALAX) 17 GM/SCOOP powder Take 17 g by  mouth daily.   Propylene Glycol (SYSTANE BALANCE) 0.6 % SOLN Apply 1 drop to eye as needed.   QUEtiapine (SEROQUEL) 50 MG tablet Take 1 tablet (50 mg total) by mouth at bedtime.   Physical Exam:    VS:  BP 138/78   Pulse 70   Ht 4\' 11"  (1.499 m)   Wt 126 lb (57.2 kg)   SpO2 95%   BMI 25.45 kg/m    Wt Readings from Last 3 Encounters:  08/04/23 126 lb (57.2  kg)  07/29/23 126 lb 2 oz (57.2 kg)  01/26/23 124 lb 9.6 oz (56.5 kg)    GEN: Well nourished, well developed in no acute distress NECK: No JVD; No carotid bruits CARDIAC: RRR, no murmurs, rubs, gallops RESPIRATORY:  Clear to auscultation without rales, wheezing or rhonchi  ABDOMEN: Soft, non-tender, non-distended EXTREMITIES:  No edema; No acute deformity   Asessement and Plan:Marland Kitchen    Hypertension: Initial blood pressure today 144/78, on recheck was 138/78.  Of note patient becomes extremely tense and slightly agitated when taking blood pressures.  Patient's daughter notes that her PCP increased candesartan to 8 mg on Friday, asked patient to confirm how much Candesartan patient has been receiving at home as daughter notes she has been cutting pill in half.  Will recheck BMET in 2 weeks.  Encouraged daughter to continue monitoring blood pressure at home and to decrease salt intake.  Continue candesartan 8 mg daily.  CAD: S/p CABG in 2003 with LIMA to LAD and SVG to D1 and ligation of LAD artery aneurysm.  Cardiac catheterization in 2013 indicated atretic LIMA to patent LAD.  Patient's daughter denies patient commenting about any chest pain or pressure.  Continue aspirin 81 mg daily, candesartan 8 mg daily.  CHF: Preserved left ventricular function on echo in 12/2018 with LVEF of 60 to 65%.  Today she appears euvolemic and well compensated on exam.  Continue candesartan 8 mg daily.  Hyperlipidemia: Last lipid profile on 07/22/2023 indicated total cholesterol 197, HDL 41.5, triglycerides 143 and LDL 127.  Patient has significant history of  intolerance to cholesterol-lowering medications.  Given patient's progression with severe dementia and behavioral disturbance will defer adding on medications at this time.  DM2: Last hemoglobin A1c 6.0 on 07/22/2023.  Monitor managed per PCP.  DVT: Patient with history of popliteal DVT in setting of COVID in 2021, she is no longer on anticoagulation.  Severe dementia: Patient's daughter notes that this has been progressing, has poor long-term and short-term memory.  Per daughter she has become increasingly agitated and aggressive in the last month.  Patient lives with her daughter, there is a caregiver that also assist in the home.    Disposition: F/u with [name], NP in four weeks.   Signed, Rip Harbour, NP

## 2023-08-04 ENCOUNTER — Ambulatory Visit: Payer: Medicare HMO | Attending: Cardiology | Admitting: Cardiology

## 2023-08-04 ENCOUNTER — Encounter: Payer: Self-pay | Admitting: Cardiology

## 2023-08-04 VITALS — BP 138/78 | HR 70 | Ht 59.0 in | Wt 126.0 lb

## 2023-08-04 DIAGNOSIS — E119 Type 2 diabetes mellitus without complications: Secondary | ICD-10-CM | POA: Diagnosis not present

## 2023-08-04 DIAGNOSIS — I25810 Atherosclerosis of coronary artery bypass graft(s) without angina pectoris: Secondary | ICD-10-CM

## 2023-08-04 DIAGNOSIS — I10 Essential (primary) hypertension: Secondary | ICD-10-CM

## 2023-08-04 DIAGNOSIS — F03918 Unspecified dementia, unspecified severity, with other behavioral disturbance: Secondary | ICD-10-CM | POA: Diagnosis not present

## 2023-08-04 DIAGNOSIS — E785 Hyperlipidemia, unspecified: Secondary | ICD-10-CM | POA: Diagnosis not present

## 2023-08-04 MED ORDER — CANDESARTAN CILEXETIL 8 MG PO TABS: 8.0000 mg | ORAL_TABLET | Freq: Every day | ORAL | 3 refills | Status: DC

## 2023-08-04 NOTE — Patient Instructions (Addendum)
Medication Instructions:  We would like for you to confirm what dose of the Candesartan *If you need a refill on your cardiac medications before your next appointment, please call your pharmacy*  Lab Work: In 2 weeks we are going to need to draw a Bmet If you have labs (blood work) drawn today and your tests are completely normal, you will receive your results only by: MyChart Message (if you have MyChart) OR A paper copy in the mail If you have any lab test that is abnormal or we need to change your treatment, we will call you to review the results.  Testing/Procedures: No testing  Follow-Up: At [facility], you and your health needs are our priority.  As part of our continuing mission to provide you with exceptional heart care, we have created designated Provider Care Teams.  These Care Teams include your primary Cardiologist (physician) and Advanced Practice Providers (APPs -  Physician Assistants and Nurse Practitioners) who all work together to provide you with the care you need, when you need it.  We recommend signing up for the patient portal called "MyChart".  Sign up information is provided on this After Visit Summary.  MyChart is used to connect with patients for Virtual Visits (Telemedicine).  Patients are able to view lab/test results, encounter notes, upcoming appointments, etc.  Non-urgent messages can be sent to your provider as well.   To learn more about what you can do with MyChart, go to [email or web address].    Your next appointment:   4 week(s)  Provider:   [name], NP

## 2023-08-04 NOTE — Addendum Note (Signed)
Addended by: Clotilde Dieter on: 08/04/2023 01:29 PM   Modules accepted: Orders

## 2023-08-05 ENCOUNTER — Encounter: Payer: Self-pay | Admitting: Family Medicine

## 2023-08-10 MED ORDER — SERTRALINE HCL 25 MG PO TABS: 25.0000 mg | ORAL_TABLET | Freq: Every day | ORAL | 3 refills | Status: DC

## 2023-08-18 ENCOUNTER — Telehealth: Payer: Self-pay | Admitting: Cardiology

## 2023-08-18 DIAGNOSIS — E119 Type 2 diabetes mellitus without complications: Secondary | ICD-10-CM | POA: Diagnosis not present

## 2023-08-18 DIAGNOSIS — I10 Essential (primary) hypertension: Secondary | ICD-10-CM | POA: Diagnosis not present

## 2023-08-18 DIAGNOSIS — I25810 Atherosclerosis of coronary artery bypass graft(s) without angina pectoris: Secondary | ICD-10-CM | POA: Diagnosis not present

## 2023-08-18 DIAGNOSIS — F03918 Unspecified dementia, unspecified severity, with other behavioral disturbance: Secondary | ICD-10-CM | POA: Diagnosis not present

## 2023-08-18 DIAGNOSIS — E785 Hyperlipidemia, unspecified: Secondary | ICD-10-CM | POA: Diagnosis not present

## 2023-08-18 NOTE — Telephone Encounter (Signed)
 5/40:981/19 2/16:165/87 2/17:147/70 2/18:201/31 High "but not possible" 2/19:110/81 2/20:154/92 2/21:147/87 2/22:131/96 2/23:127/80 2/24:126/96 2/25:125/80 2/26:168/97 2/27:175/85 Highest Possible reading 2/28:113/79 Lowest Reading 3/3:148/80 3/5:178/84 Average: 144/86 Not including reading of 2/18 Average: 173/59 including all reading

## 2023-08-18 NOTE — Telephone Encounter (Signed)
 Dropped of BP readings In provider box

## 2023-08-19 DIAGNOSIS — R059 Cough, unspecified: Secondary | ICD-10-CM | POA: Diagnosis not present

## 2023-08-19 DIAGNOSIS — R350 Frequency of micturition: Secondary | ICD-10-CM | POA: Diagnosis not present

## 2023-08-19 DIAGNOSIS — R0981 Nasal congestion: Secondary | ICD-10-CM | POA: Diagnosis not present

## 2023-08-19 DIAGNOSIS — N3000 Acute cystitis without hematuria: Secondary | ICD-10-CM | POA: Diagnosis not present

## 2023-08-19 LAB — BASIC METABOLIC PANEL
BUN/Creatinine Ratio: 24 (ref 12–28)
BUN: 18 mg/dL (ref 8–27)
CO2: 21 mmol/L (ref 20–29)
Calcium: 9.6 mg/dL (ref 8.7–10.3)
Chloride: 100 mmol/L (ref 96–106)
Creatinine, Ser: 0.76 mg/dL (ref 0.57–1.00)
Glucose: 235 mg/dL — ABNORMAL HIGH (ref 70–99)
Potassium: 4.3 mmol/L (ref 3.5–5.2)
Sodium: 136 mmol/L (ref 134–144)
eGFR: 77 mL/min/{1.73_m2} (ref >59)

## 2023-08-24 ENCOUNTER — Ambulatory Visit (HOSPITAL_COMMUNITY)
Admission: RE | Admit: 2023-08-24 | Discharge: 2023-08-24 | Disposition: A | Discharge status: Home / Self Care | Payer: Medicare HMO | Source: Ambulatory Visit | Attending: Family Medicine | Admitting: Family Medicine

## 2023-08-24 DIAGNOSIS — R0989 Other specified symptoms and signs involving the circulatory and respiratory systems: Secondary | ICD-10-CM | POA: Diagnosis not present

## 2023-08-25 ENCOUNTER — Encounter: Payer: Self-pay | Admitting: Family Medicine

## 2023-08-25 DIAGNOSIS — I10 Essential (primary) hypertension: Secondary | ICD-10-CM | POA: Diagnosis not present

## 2023-08-25 DIAGNOSIS — E139 Other specified diabetes mellitus without complications: Secondary | ICD-10-CM | POA: Diagnosis not present

## 2023-08-25 DIAGNOSIS — M8580 Other specified disorders of bone density and structure, unspecified site: Secondary | ICD-10-CM | POA: Diagnosis not present

## 2023-08-25 DIAGNOSIS — E0590 Thyrotoxicosis, unspecified without thyrotoxic crisis or storm: Secondary | ICD-10-CM | POA: Diagnosis not present

## 2023-08-25 NOTE — Telephone Encounter (Signed)
  Please call Ms. Meredith Gutierrez and her daughter, let them know I have reviewed her blood pressure log. I would like her to continue to monitor her blood pressure and keep the log until follow up on 3/28.  Her lab work showed normal kidney function and normal electrolytes.  Continue current medications and follow-up as planned. Please bring blood pressure cuff to follow up appointment.  Meredith Littler, NP   Called patient daughter advised of above she verbalized understanding will bring in blood pressure cuff.

## 2023-08-29 ENCOUNTER — Encounter: Payer: Self-pay | Admitting: Family Medicine

## 2023-09-06 NOTE — Progress Notes (Unsigned)
 Cardiology Office Note    Date:  09/09/2023  ID:  Meredith Gutierrez 1940-07-24, MRN [id number] PCP:  Eustaquio Boyden, MD  Cardiologist:  Thurmon Fair, MD  Electrophysiologist:  None   Chief Complaint: Hypertension  History of Present Illness: .    Meredith Gutierrez is a 83 y.o. female with visit-pertinent history of hypertension, hyperlipidemia, type 2 diabetes mellitus and CAD s/p CABG in 2003 with LIMA to LAD and SVG to D1 and ligation of LAD artery aneurysm.  Cardiac catheterization in 2013 indicated atretic LIMA to patent LAD.  Patient has history of hyperdynamic LV systolic function, moderate LVH, chronic diastolic heart failure, right renal artery stenosis.  She also has history of statin intolerance including Crestor, Zocor, Zetia and other cholesterol-lowering medications.  In 2021 she was on anticoagulation for provoked DVT during COVID infection, she is no longer on anticoagulation.  Patient also has progressive dementia.  Meredith Gutierrez was last seen in clinic on 01/26/2023 accompanied by her daughter.  It was noted that she denied any anginal symptoms.  Her blood pressure was very well-controlled.  It was recommended that she have 1 year follow-up.  On 08/04/2023 patient presented with her daughter regarding high blood pressures.  Blood pressure log showed variable blood pressures ranging from 110/81 to 165/87.  Patient's blood pressure had been increased by her PCP the week before.  Today patient presents for follow-up regarding hypertension.  Patient presents with daughter who assists with history.  They report that she has been doing very well overall.  Patient's daughter reports that she has not reported any chest pain or shortness of breath.  Last week she has noticed some increased ankle edema that worsens throughout the day and is improved in the morning.  She does not believe that she has had any changes to her diet.  ROS: .   Today she and her daughter denies chest pain,  shortness of breath, fatigue, palpitations, melena, hematuria, hemoptysis, diaphoresis, weakness, presyncope, syncope, orthopnea, and PND.  All other systems are reviewed and otherwise negative. Studies Reviewed: Marland Kitchen    EKG:  EKG is not ordered today.  CV Studies: Cardiac studies reviewed are outlined and summarized above. Otherwise please see EMR for full report. Cardiac Studies & Procedures   ______________________________________________________________________________________________   STRESS TESTS  NM MYOCAR MULTI W/SPECT W 06/28/2007   ECHOCARDIOGRAM  ECHOCARDIOGRAM COMPLETE 12/21/2018  Narrative ECHOCARDIOGRAM REPORT    Patient Name:   Meredith Gutierrez Date of Exam: 12/21/2018 Medical Rec #:  [id number]       Height:       60.0 in Accession #:    [id number]      Weight:       140.0 lb Date of Birth:  [birth year]       BSA:          1.60 m Patient Age:    77 years        BP:           92/55 mmHg Patient Gender: F               HR:           77 bpm. Exam Location:  Inpatient   Procedure: 2D Echo, Cardiac Doppler and Color Doppler  Indications:    Bacteremia.  History:        Patient has prior history of Echocardiogram examinations, most recent 09/01/2017. CHF CAD COPD Signs/Symptoms: Murmur Risk Factors: Hypertension, Diabetes and Dyslipidemia. Cancer. Hypothyroidism.  Sonographer:    Elmarie Shiley Dance Referring Phys: [phone] RALPH A NETTEY  IMPRESSIONS   1. The left ventricle has normal systolic function with an ejection fraction of 60-65%. The cavity size was normal. There is mild concentric left ventricular hypertrophy. Left ventricular diastolic Doppler parameters are consistent with impaired relaxation. Elevated left ventricular end-diastolic pressure The E/e' is 21. No evidence of left ventricular regional wall motion abnormalities. 2. The right ventricle has normal systolic function. The cavity was normal. There is no increase in right ventricular wall thickness. Right  ventricular systolic pressure is normal with an estimated pressure of 28.1 mmHg. 3. Left atrial size was moderately dilated. 4. Right atrial size was moderately dilated. 5. Mild thickening of the mitral valve leaflet. Mild calcification of the mitral valve leaflet. There is mild mitral annular calcification present. No evidence of mitral valve stenosis. 6. Moderate thickening of the aortic valve. Moderate calcification of the aortic valve. No stenosis of the aortic valve. 7. The aortic root is normal in size and structure. 8. There is mild dilatation of the ascending aorta measuring 41 mm. 9. The inferior vena cava was dilated in size with >50% respiratory variability.  SUMMARY  No apparent vegetation consistent with endocarditis. Study similar to prior. Consider TEE for definitive evaluation if it would change clinical management. FINDINGS Left Ventricle: The left ventricle has normal systolic function, with an ejection fraction of 60-65%. The cavity size was normal. There is mild concentric left ventricular hypertrophy. Left ventricular diastolic Doppler parameters are consistent with impaired relaxation. Elevated left ventricular end-diastolic pressure The E/e' is 21. No evidence of left ventricular regional wall motion abnormalities..  Right Ventricle: The right ventricle has normal systolic function. The cavity was normal. There is no increase in right ventricular wall thickness. Right ventricular systolic pressure is normal with an estimated pressure of 28.1 mmHg.  Left Atrium: Left atrial size was moderately dilated.  Right Atrium: Right atrial size was moderately dilated. Right atrial pressure is estimated at 8 mmHg.  Interatrial Septum: No atrial level shunt detected by color flow Doppler.  Pericardium: There is no evidence of pericardial effusion.  Mitral Valve: The mitral valve is normal in structure. Mild thickening of the mitral valve leaflet. Mild calcification of the mitral  valve leaflet. There is mild mitral annular calcification present. Mitral valve regurgitation is trivial by color flow Doppler. No evidence of mitral valve stenosis.  Tricuspid Valve: The tricuspid valve is normal in structure. Tricuspid valve regurgitation is trivial by color flow Doppler.  Aortic Valve: The aortic valve is normal in structure. Moderate thickening of the aortic valve. Moderate calcification of the aortic valve. Aortic valve regurgitation was not visualized by color flow Doppler. There is No stenosis of the aortic valve.  Pulmonic Valve: The pulmonic valve was grossly normal. Pulmonic valve regurgitation is mild by color flow Doppler. No evidence of pulmonic stenosis.  Aorta: The aortic root is normal in size and structure. There is mild dilatation of the ascending aorta measuring 41 mm.  Pulmonary Artery: The pulmonary artery is not well seen.  Venous: The inferior vena cava is dilated in size with greater than 50% respiratory variability.   +--------------+--------++ LEFT VENTRICLE         +----------------+---------++ +--------------+--------++ Diastology                PLAX 2D                +----------------+---------++ +--------------+--------++ LV e' lateral:  6.96 cm/s LVIDd:  4.12 cm  +----------------+---------++ +--------------+--------++ LV E/e' lateral:18.7      LVIDs:        2.39 cm  +----------------+---------++ +--------------+--------++ LV e' medial:   5.44 cm/s LV PW:        1.23 cm  +----------------+---------++ +--------------+--------++ LV E/e' medial: 23.9      LV IVS:       1.32 cm  +----------------+---------++ +--------------+--------++ LVOT diam:    2.10 cm  +--------------+--------++ LV SV:        55 ml    +--------------+--------++ LV SV Index:  33.23    +--------------+--------++ LVOT Area:    3.46 cm +--------------+--------++                         +--------------+--------++  +---------------+----------++ RIGHT VENTRICLE           +---------------+----------++ RV Basal diam: 2.76 cm    +---------------+----------++ RV S prime:    10.80 cm/s +---------------+----------++ TAPSE (M-mode):1.8 cm     +---------------+----------++ RVSP:          28.1 mmHg  +---------------+----------++  +---------------+-------++-----------++ LEFT ATRIUM           Index       +---------------+-------++-----------++ LA diam:       4.10 cm2.56 cm/m  +---------------+-------++-----------++ LA Vol (A2C):  78.5 ml48.94 ml/m +---------------+-------++-----------++ LA Vol (A4C):  72.6 ml45.26 ml/m +---------------+-------++-----------++ LA Biplane Vol:78.4 ml48.88 ml/m +---------------+-------++-----------++ +------------+---------++-----------++ RIGHT ATRIUM         Index       +------------+---------++-----------++ RA Pressure:8.00 mmHg            +------------+---------++-----------++ RA Area:    18.60 cm            +------------+---------++-----------++ RA Volume:  60.20 ml 37.53 ml/m +------------+---------++-----------++ +------------+-----------++ AORTIC VALVE            +------------+-----------++ LVOT Vmax:  132.00 cm/s +------------+-----------++ LVOT Vmean: 92.300 cm/s +------------+-----------++ LVOT VTI:   0.284 m     +------------+-----------++  +-------------+-------++ AORTA                +-------------+-------++ Ao Root diam:3.50 cm +-------------+-------++ Ao Asc diam: 4.10 cm +-------------+-------++  +--------------+----------++  +---------------+-----------++ MITRAL VALVE              TRICUSPID VALVE            +--------------+----------++  +---------------+-----------++ MV Area (PHT):2.54 cm    TR Peak grad:  20.1 mmHg   +--------------+----------++  +---------------+-----------++ MV  PHT:       86.71 msec  TR Vmax:       235.00 cm/s +--------------+----------++  +---------------+-----------++ MV Decel Time:299 msec    Estimated RAP: 8.00 mmHg   +--------------+----------++  +---------------+-----------++ +--------------+-----------++ RVSP:          28.1 mmHg   MV E velocity:130.00 cm/s +---------------+-----------++ +--------------+-----------++ MV A velocity:153.00 cm/s +--------------+-------+ +--------------+-----------++ SHUNTS                MV E/A ratio: 0.85        +--------------+-------+ +--------------+-----------++ Systemic VTI: 0.28 m  +--------------+-------+ Systemic Diam:2.10 cm +--------------+-------+   Jodelle Red MD Electronically signed by Jodelle Red MD Signature Date/Time: 12/21/2018/5:58:17 PM    Final          ______________________________________________________________________________________________       Current Reported Medications:.    Current Meds  Medication Sig   albuterol (PROVENTIL) (2.5 MG/3ML) 0.083% nebulizer solution Take 3 mLs (2.5 mg total) by nebulization every 6 (  six) hours as needed for wheezing or shortness of breath.   Alcohol Swabs (B-D SINGLE USE SWABS REGULAR) PADS 1 each by Does not apply route daily. Use as directed to check blood sugar once daily.  Dx code:  E11.65   ascorbic acid (VITAMIN C) 500 MG tablet Take 500 mg by mouth daily.   aspirin EC 81 MG tablet Take 81 mg by mouth daily.   BD PEN NEEDLE NANO 2ND GEN 32G X 4 MM MISC as directed subcutaneous Three times a day for 90 days   Blood Glucose Monitoring Suppl (ACCU-CHEK AVIVA PLUS) w/Device KIT 1 each by Does not apply route daily. Use as directed to check blood sugar once daily.  Dx code:  E11.65   candesartan (ATACAND) 8 MG tablet Take 1 tablet (8 mg total) by mouth daily.   cholecalciferol (VITAMIN D3) 25 MCG (1000 UNIT) tablet Take 1,000 Units by mouth daily.   Continuous Glucose  Receiver (DEXCOM G7 RECEIVER) DEVI by Does not apply route.   Cyanocobalamin (B-12) 1000 MCG SUBL Place 1 tablet under the tongue 2 (two) times a week.   glucose blood (ACCU-CHEK AVIVA PLUS) test strip 1 each by Other route as needed for other. Use as instructed to check blood sugar once daily.  Dx code:  E11.65   Insulin Pen Needle (PEN NEEDLES) 33G X 4 MM MISC Use as instructed to administer insulin daily.   loratadine (CLARITIN) 10 MG tablet Take 1 tablet (10 mg total) by mouth daily.   meloxicam (MOBIC) 7.5 MG tablet Take by mouth. As needed   morphine (MS CONTIN) 15 MG 12 hr tablet Take 15 mg by mouth daily.   NOVOLOG MIX 70/30 FLEXPEN (70-30) 100 UNIT/ML FlexPen 22 units in the morning, 32 units in the evening   polyethylene glycol powder (GLYCOLAX/MIRALAX) 17 GM/SCOOP powder Take 17 g by mouth daily.   Propylene Glycol (SYSTANE BALANCE) 0.6 % SOLN Apply 1 drop to eye as needed.   QUEtiapine (SEROQUEL) 50 MG tablet Take 1 tablet (50 mg total) by mouth at bedtime.   sertraline (ZOLOFT) 25 MG tablet Take 1 tablet (25 mg total) by mouth daily.    Physical Exam:    VS:  BP 136/84 (BP Location: Left Arm, Patient Position: Sitting)   Pulse 64   Ht 5\' 2"  (1.575 m)   Wt 123 lb (55.8 kg)   SpO2 99%   BMI 22.50 kg/m    Wt Readings from Last 3 Encounters:  09/09/23 123 lb (55.8 kg)  08/04/23 126 lb (57.2 kg)  07/29/23 126 lb 2 oz (57.2 kg)    GEN: Well nourished, well developed in no acute distress NECK: No JVD; No carotid bruits CARDIAC: RRR, no murmurs, rubs, gallops RESPIRATORY:  Clear to auscultation without rales, wheezing or rhonchi  ABDOMEN: Soft, non-tender, non-distended EXTREMITIES:  Mild nonpitting bilateral ankle edema; No acute deformity     Asessement and Plan:Marland Kitchen    Hypertension: Blood pressure today 136/84.  Patient's daughter notes that blood pressure is variable at home, likely averaging at 135-140 systolic.  Her daughter notes significant difficulty in checking blood  pressure as patient becomes very tense and will on occasion start screaming.  Patient's blood pressure overall well-controlled today, through shared decision making with daughter elected to not make any changes at this time.  Continue candesartan 8 mg daily.  CAD: S/p CABG in 2003 with LIMA to LAD and SVG to D1 and ligation of LAD artery aneurysm.  Cardiac catheterization in 2013  indicated atretic LIMA to patent LAD.  Patient's daughter denies patient has commented about any chest pain or pressure.  Continue aspirin 81 mg daily, candesartan 8 mg daily.  CHF: Preserved left ventricular function on echo in 12/2018 with LVEF of 60 to 65%. Today she appears overall euvolemic and well compensated on exam, she does have some mild bilateral ankle edema.  Patient's daughter notes that she notes more swelling at the end of the day that improves overnight.  Discussed potentially adding back a small dose of Lasix, patient's daughter deferred at this time.  She will continue to monitor and if notes any worsening will notify the office. Continue candesartan 8 mg daily.  Hyperlipidemia: Last lipid profile on 07/22/2023 indicated total cholesterol 197, HDL 41.5, triglycerides 143 and LDL 127.  Patient has significant history of intolerance to cholesterol-lowering medications.  Given patient's progression with severe dementia and behavioral disturbance will defer adding on medications at this time.  DM2: Last hemoglobin A1c 6.0 on 07/22/2023.  Monitored and managed per PCP.  History of DVT: Patient with history of popliteal DVT in setting of COVID in 2021, she is no longer on anticoagulation.  Severe dementia: Patient's daughter has noted that patient has had the disease progression, she has poor long-term and short-term memory.  Per patient's daughter she has become increasingly agitated and aggressive in the last months.   Disposition: F/u with Dr. Royann Shivers or [name], NP in 4-5 months or sooner if needed.    Signed, Rip Harbour, NP

## 2023-09-09 ENCOUNTER — Encounter: Payer: Self-pay | Admitting: Cardiology

## 2023-09-09 ENCOUNTER — Ambulatory Visit: Payer: Medicare HMO | Attending: Cardiology | Admitting: Cardiology

## 2023-09-09 VITALS — BP 136/84 | HR 64 | Ht 62.0 in | Wt 123.0 lb

## 2023-09-09 DIAGNOSIS — I5032 Chronic diastolic (congestive) heart failure: Secondary | ICD-10-CM | POA: Diagnosis not present

## 2023-09-09 DIAGNOSIS — I10 Essential (primary) hypertension: Secondary | ICD-10-CM

## 2023-09-09 DIAGNOSIS — E119 Type 2 diabetes mellitus without complications: Secondary | ICD-10-CM | POA: Diagnosis not present

## 2023-09-09 DIAGNOSIS — E785 Hyperlipidemia, unspecified: Secondary | ICD-10-CM | POA: Diagnosis not present

## 2023-09-09 DIAGNOSIS — F03918 Unspecified dementia, unspecified severity, with other behavioral disturbance: Secondary | ICD-10-CM | POA: Diagnosis not present

## 2023-09-09 DIAGNOSIS — I25810 Atherosclerosis of coronary artery bypass graft(s) without angina pectoris: Secondary | ICD-10-CM | POA: Diagnosis not present

## 2023-09-09 NOTE — Patient Instructions (Addendum)
 Medication Instructions:  NO CHANGES *If you need a refill on your cardiac medications before your next appointment, please call your pharmacy*  Lab Work: NO LABS If you have labs (blood work) drawn today and your tests are completely normal, you will receive your results only by: MyChart Message (if you have MyChart) OR A paper copy in the mail If you have any lab test that is abnormal or we need to change your treatment, we will call you to review the results.  Testing/Procedures: NO TESTING  Follow-Up: At [facility], you and your health needs are our priority.  As part of our continuing mission to provide you with exceptional heart care, our providers are all part of one team.  This team includes your primary Cardiologist (physician) and Advanced Practice Providers or APPs (Physician Assistants and Nurse Practitioners) who all work together to provide you with the care you need, when you need it.  Your next appointment:   5 month(s)  Provider:   [name], NP  Other Instructions   1st Floor: - Lobby - Registration  - Pharmacy  - Lab - Cafe  2nd Floor: - PV Lab - Diagnostic Testing (echo, CT, nuclear med)  3rd Floor: - Vacant  4th Floor: - TCTS (cardiothoracic surgery) - AFib Clinic - Structural Heart Clinic - Vascular Surgery  - Vascular Ultrasound  5th Floor: - [facility] (general and EP) - Clinical Pharmacy for coumadin, hypertension, lipid, weight-loss medications, and med management appointments    Valet parking services will be available as well.

## 2023-09-16 ENCOUNTER — Encounter: Payer: Self-pay | Admitting: Family Medicine

## 2023-09-16 DIAGNOSIS — F03918 Unspecified dementia, unspecified severity, with other behavioral disturbance: Secondary | ICD-10-CM

## 2023-09-18 DIAGNOSIS — I10 Essential (primary) hypertension: Secondary | ICD-10-CM

## 2023-09-18 DIAGNOSIS — I25810 Atherosclerosis of coronary artery bypass graft(s) without angina pectoris: Secondary | ICD-10-CM

## 2023-09-18 DIAGNOSIS — R600 Localized edema: Secondary | ICD-10-CM

## 2023-09-20 NOTE — Telephone Encounter (Deleted)
 Copied from CRM [id number]. Topic: Referral - Request for Referral >> Sep 20, 2023  3:19 PM Fredrich Romans wrote: Did the patient discuss referral with their provider in the last year? Yes (If No - schedule appointment) (If Yes - send message)  Appointment offered? No  Type of order/referral and detailed reason for visit: Neurology  Preference of office, provider, location: [facility]   If referral order, have you been seen by this specialty before? Yes (If Yes, this issue or another issue? When? Where?  Can we respond through MyChart? Yes

## 2023-09-23 ENCOUNTER — Telehealth: Payer: Self-pay | Admitting: Family Medicine

## 2023-09-23 DIAGNOSIS — F03918 Unspecified dementia, unspecified severity, with other behavioral disturbance: Secondary | ICD-10-CM

## 2023-09-23 DIAGNOSIS — F05 Delirium due to known physiological condition: Secondary | ICD-10-CM

## 2023-09-23 NOTE — Telephone Encounter (Signed)
 Rtn call to pt's daughter, Okey Regal (on dpr). States she was just checking on neuro referral request (see 09/16/23 pt msg). Says it's been too long since pt has seen them. So now a referral is needed.

## 2023-09-23 NOTE — Telephone Encounter (Signed)
 Copied from CRM [id number]. Topic: Referral - Question >> Sep 23, 2023 11:37 AM Denese Killings wrote: Reason for CRM: Meredith Gutierrez patient daughter is requesting a callback regarding referral. She is requesting a callback from [facility].

## 2023-09-26 ENCOUNTER — Other Ambulatory Visit (HOSPITAL_COMMUNITY): Payer: Self-pay

## 2023-09-26 MED ORDER — FUROSEMIDE 20 MG PO TABS
20.0000 mg | ORAL_TABLET | Freq: Every day | ORAL | 3 refills | Status: AC
  Filled 2023-09-26: qty 90, 90d supply, fill #0

## 2023-09-26 MED ORDER — QUETIAPINE FUMARATE 50 MG PO TABS: ORAL_TABLET | ORAL | 1 refills | Status: DC

## 2023-09-26 NOTE — Telephone Encounter (Signed)
 Lvm asking pt's daughter, Sheryle Donning (on dpr), to call back. Need to relay Dr Ocie Belt message.

## 2023-09-26 NOTE — Addendum Note (Signed)
 Addended by: Claire Crick on: 09/26/2023 08:10 AM   Modules accepted: Orders

## 2023-09-26 NOTE — Telephone Encounter (Signed)
 Plz notify daughter new neuro referral placed.

## 2023-09-27 ENCOUNTER — Telehealth: Payer: Self-pay | Admitting: Family Medicine

## 2023-09-27 DIAGNOSIS — E1165 Type 2 diabetes mellitus with hyperglycemia: Secondary | ICD-10-CM | POA: Diagnosis not present

## 2023-09-27 NOTE — Telephone Encounter (Signed)
 Note. See 09/23/23 phn note.

## 2023-09-27 NOTE — Telephone Encounter (Signed)
 Pt's daughter rtn call (see 09/27/23 phn note).  Left message on vm, per dpr, relaying Dr Ocie Belt message.

## 2023-09-27 NOTE — Telephone Encounter (Signed)
 Copied from CRM [id number]. Topic: General - Call Back - No Documentation >> Sep 27, 2023  9:21 AM Georgeann Kindred wrote: Reason for CRM: Patient's daughter Sheryle Donning returning call to [facility] Simper Goins regarding message from provider. Please contact her back at [phone].

## 2023-09-27 NOTE — Telephone Encounter (Signed)
 Lvm asking pt's daughter, Sheryle Donning (on dpr), to call back. Need to relay Dr Ocie Belt message.

## 2023-10-03 ENCOUNTER — Telehealth: Payer: Self-pay

## 2023-10-03 NOTE — Telephone Encounter (Addendum)
 Agree with hospice evaluation if daughter is in agreement.

## 2023-10-03 NOTE — Telephone Encounter (Signed)
 Spoke with Alois Arnt, of [facility] informing her Dr Crissie Dome is verbal orders for hospice eval for pt. She expresses her thanks.

## 2023-10-03 NOTE — Telephone Encounter (Signed)
 Copied from CRM [id number]. Topic: Clinical - Home Health Verbal Orders >> Sep 29, 2023  3:35 PM Orien Bird wrote: Caller/Agency: [facility] Number: [phone] Service Requested: Hospice Frequency: 6 months to life  Any new concerns about the patient? No

## 2023-10-06 ENCOUNTER — Other Ambulatory Visit (HOSPITAL_COMMUNITY): Payer: Self-pay

## 2023-11-17 DIAGNOSIS — N390 Urinary tract infection, site not specified: Secondary | ICD-10-CM | POA: Diagnosis not present

## 2023-11-28 DIAGNOSIS — E139 Other specified diabetes mellitus without complications: Secondary | ICD-10-CM | POA: Diagnosis not present

## 2023-12-26 DIAGNOSIS — E1165 Type 2 diabetes mellitus with hyperglycemia: Secondary | ICD-10-CM | POA: Diagnosis not present

## 2024-01-11 ENCOUNTER — Telehealth: Payer: Self-pay | Admitting: Family Medicine

## 2024-01-11 NOTE — Telephone Encounter (Signed)
 Spoke with daughter Niels to express my condolences.

## 2024-01-13 DEATH — deceased

## 2024-01-30 ENCOUNTER — Ambulatory Visit: Admitting: Neurology

## 2024-02-08 ENCOUNTER — Ambulatory Visit: Admitting: Cardiology

## 2024-07-27 ENCOUNTER — Other Ambulatory Visit: Payer: Medicare HMO

## 2024-08-03 ENCOUNTER — Encounter: Payer: Medicare HMO | Admitting: Family Medicine
